# Patient Record
Sex: Female | Born: 1935 | Race: White | Hispanic: No | Marital: Married | Smoking: Never smoker
Health system: Southern US, Community
[De-identification: ages and names within clinical notes are randomized; demographics above are authoritative.]

## PROBLEM LIST (undated history)

## (undated) DIAGNOSIS — R001 Bradycardia, unspecified: Secondary | ICD-10-CM

## (undated) DIAGNOSIS — I480 Paroxysmal atrial fibrillation: Secondary | ICD-10-CM

## (undated) DIAGNOSIS — I5189 Other ill-defined heart diseases: Secondary | ICD-10-CM

## (undated) DIAGNOSIS — I700 Atherosclerosis of aorta: Secondary | ICD-10-CM

## (undated) DIAGNOSIS — F419 Anxiety disorder, unspecified: Secondary | ICD-10-CM

## (undated) DIAGNOSIS — I639 Cerebral infarction, unspecified: Secondary | ICD-10-CM

## (undated) DIAGNOSIS — I509 Heart failure, unspecified: Secondary | ICD-10-CM

## (undated) DIAGNOSIS — N893 Dysplasia of vagina, unspecified: Secondary | ICD-10-CM

## (undated) DIAGNOSIS — I495 Sick sinus syndrome: Secondary | ICD-10-CM

## (undated) DIAGNOSIS — Z950 Presence of cardiac pacemaker: Secondary | ICD-10-CM

## (undated) DIAGNOSIS — Z7901 Long term (current) use of anticoagulants: Secondary | ICD-10-CM

## (undated) DIAGNOSIS — I10 Essential (primary) hypertension: Secondary | ICD-10-CM

## (undated) DIAGNOSIS — M1990 Unspecified osteoarthritis, unspecified site: Secondary | ICD-10-CM

## (undated) DIAGNOSIS — C801 Malignant (primary) neoplasm, unspecified: Secondary | ICD-10-CM

## (undated) DIAGNOSIS — T8859XA Other complications of anesthesia, initial encounter: Secondary | ICD-10-CM

## (undated) HISTORY — DX: Bradycardia, unspecified: R00.1

## (undated) HISTORY — PX: JOINT REPLACEMENT: SHX530

## (undated) HISTORY — PX: HERNIA REPAIR: SHX51

## (undated) HISTORY — DX: Dysplasia of vagina, unspecified: N89.3

## (undated) HISTORY — DX: Cerebral infarction, unspecified: I63.9

## (undated) HISTORY — DX: Heart failure, unspecified: I50.9

## (undated) HISTORY — PX: APPENDECTOMY: SHX54

## (undated) HISTORY — PX: TOTAL KNEE ARTHROPLASTY: SHX125

## (undated) HISTORY — PX: COLONOSCOPY: SHX174

## (undated) HISTORY — PX: TONSILLECTOMY: SUR1361

## (undated) HISTORY — DX: Presence of cardiac pacemaker: Z95.0

## (undated) HISTORY — DX: Essential (primary) hypertension: I10

## (undated) HISTORY — PX: VAGINAL HYSTERECTOMY: SUR661

## (undated) HISTORY — DX: Sick sinus syndrome: I49.5

---

## 1997-01-18 HISTORY — PX: LEEP: SHX91

## 1999-09-14 ENCOUNTER — Other Ambulatory Visit: Admission: RE | Admit: 1999-09-14 | Discharge: 1999-09-14 | Payer: Self-pay | Admitting: Orthopedic Surgery

## 2001-05-12 ENCOUNTER — Encounter: Admission: RE | Admit: 2001-05-12 | Discharge: 2001-05-12 | Payer: Self-pay | Admitting: Internal Medicine

## 2001-05-12 ENCOUNTER — Encounter: Payer: Self-pay | Admitting: Internal Medicine

## 2001-05-12 ENCOUNTER — Encounter (INDEPENDENT_AMBULATORY_CARE_PROVIDER_SITE_OTHER): Payer: Self-pay | Admitting: *Deleted

## 2001-05-12 DIAGNOSIS — D242 Benign neoplasm of left breast: Secondary | ICD-10-CM

## 2001-05-12 HISTORY — DX: Benign neoplasm of left breast: D24.2

## 2001-05-12 HISTORY — PX: BREAST BIOPSY: SHX20

## 2002-05-22 ENCOUNTER — Encounter: Payer: Self-pay | Admitting: Internal Medicine

## 2002-05-22 ENCOUNTER — Encounter: Admission: RE | Admit: 2002-05-22 | Discharge: 2002-05-22 | Payer: Self-pay | Admitting: Internal Medicine

## 2003-09-25 ENCOUNTER — Encounter: Admission: RE | Admit: 2003-09-25 | Discharge: 2003-09-25 | Payer: Self-pay | Admitting: Internal Medicine

## 2003-11-20 ENCOUNTER — Ambulatory Visit: Payer: Self-pay | Admitting: Internal Medicine

## 2003-11-27 ENCOUNTER — Ambulatory Visit: Payer: Self-pay | Admitting: Internal Medicine

## 2003-12-05 ENCOUNTER — Ambulatory Visit: Payer: Self-pay | Admitting: Internal Medicine

## 2003-12-06 ENCOUNTER — Ambulatory Visit (HOSPITAL_COMMUNITY): Admission: RE | Admit: 2003-12-06 | Discharge: 2003-12-07 | Payer: Self-pay | Admitting: Internal Medicine

## 2003-12-06 DIAGNOSIS — Z950 Presence of cardiac pacemaker: Secondary | ICD-10-CM

## 2003-12-06 HISTORY — DX: Presence of cardiac pacemaker: Z95.0

## 2003-12-06 HISTORY — PX: INSERT / REPLACE / REMOVE PACEMAKER: SUR710

## 2003-12-16 ENCOUNTER — Ambulatory Visit: Payer: Self-pay | Admitting: Internal Medicine

## 2003-12-16 ENCOUNTER — Ambulatory Visit: Payer: Self-pay

## 2003-12-17 ENCOUNTER — Ambulatory Visit: Payer: Self-pay | Admitting: Internal Medicine

## 2003-12-26 ENCOUNTER — Ambulatory Visit: Payer: Self-pay | Admitting: Internal Medicine

## 2004-02-06 ENCOUNTER — Ambulatory Visit: Payer: Self-pay | Admitting: Internal Medicine

## 2004-03-20 ENCOUNTER — Ambulatory Visit: Payer: Self-pay | Admitting: Internal Medicine

## 2004-04-29 ENCOUNTER — Ambulatory Visit: Payer: Self-pay | Admitting: Gynecologic Oncology

## 2004-10-02 ENCOUNTER — Encounter: Admission: RE | Admit: 2004-10-02 | Discharge: 2004-10-02 | Payer: Self-pay | Admitting: Obstetrics and Gynecology

## 2005-01-25 ENCOUNTER — Ambulatory Visit: Payer: Self-pay | Admitting: Internal Medicine

## 2005-02-05 ENCOUNTER — Ambulatory Visit: Payer: Self-pay | Admitting: Internal Medicine

## 2005-04-02 ENCOUNTER — Encounter: Admission: RE | Admit: 2005-04-02 | Discharge: 2005-04-02 | Payer: Self-pay | Admitting: Internal Medicine

## 2005-04-07 ENCOUNTER — Ambulatory Visit: Payer: Self-pay | Admitting: Internal Medicine

## 2005-04-28 ENCOUNTER — Ambulatory Visit: Payer: Self-pay | Admitting: Gynecologic Oncology

## 2005-07-13 ENCOUNTER — Ambulatory Visit: Payer: Self-pay | Admitting: Internal Medicine

## 2005-09-13 ENCOUNTER — Ambulatory Visit: Payer: Self-pay | Admitting: Internal Medicine

## 2006-04-13 ENCOUNTER — Ambulatory Visit: Payer: Self-pay | Admitting: Internal Medicine

## 2006-05-18 ENCOUNTER — Encounter: Admission: RE | Admit: 2006-05-18 | Discharge: 2006-05-18 | Payer: Self-pay | Admitting: Internal Medicine

## 2006-06-29 ENCOUNTER — Ambulatory Visit: Payer: Self-pay | Admitting: Internal Medicine

## 2006-07-19 ENCOUNTER — Encounter: Payer: Self-pay | Admitting: Urology

## 2006-08-19 ENCOUNTER — Encounter: Payer: Self-pay | Admitting: Urology

## 2006-09-28 ENCOUNTER — Ambulatory Visit: Payer: Self-pay | Admitting: Internal Medicine

## 2006-12-28 ENCOUNTER — Ambulatory Visit: Payer: Self-pay | Admitting: Internal Medicine

## 2007-04-03 ENCOUNTER — Ambulatory Visit: Payer: Self-pay | Admitting: Internal Medicine

## 2007-06-07 ENCOUNTER — Encounter: Admission: RE | Admit: 2007-06-07 | Discharge: 2007-06-07 | Payer: Self-pay | Admitting: Internal Medicine

## 2007-06-19 ENCOUNTER — Ambulatory Visit: Payer: Self-pay | Admitting: Internal Medicine

## 2007-09-20 ENCOUNTER — Ambulatory Visit: Payer: Self-pay | Admitting: Internal Medicine

## 2007-12-21 ENCOUNTER — Ambulatory Visit: Payer: Self-pay | Admitting: Internal Medicine

## 2008-03-20 ENCOUNTER — Ambulatory Visit: Payer: Self-pay | Admitting: Internal Medicine

## 2008-04-26 ENCOUNTER — Encounter (INDEPENDENT_AMBULATORY_CARE_PROVIDER_SITE_OTHER): Payer: Self-pay | Admitting: Radiology

## 2008-06-13 DIAGNOSIS — Z950 Presence of cardiac pacemaker: Secondary | ICD-10-CM | POA: Insufficient documentation

## 2008-06-13 DIAGNOSIS — I4891 Unspecified atrial fibrillation: Secondary | ICD-10-CM | POA: Insufficient documentation

## 2008-06-13 DIAGNOSIS — I498 Other specified cardiac arrhythmias: Secondary | ICD-10-CM | POA: Insufficient documentation

## 2008-06-24 ENCOUNTER — Ambulatory Visit: Payer: Self-pay | Admitting: Internal Medicine

## 2008-07-25 ENCOUNTER — Encounter: Admission: RE | Admit: 2008-07-25 | Discharge: 2008-07-25 | Payer: Self-pay | Admitting: Internal Medicine

## 2008-09-17 ENCOUNTER — Telehealth (INDEPENDENT_AMBULATORY_CARE_PROVIDER_SITE_OTHER): Payer: Self-pay | Admitting: *Deleted

## 2008-10-07 ENCOUNTER — Ambulatory Visit: Payer: Self-pay | Admitting: Internal Medicine

## 2008-12-30 ENCOUNTER — Ambulatory Visit: Payer: Self-pay | Admitting: Internal Medicine

## 2009-04-21 ENCOUNTER — Ambulatory Visit: Payer: Self-pay | Admitting: Internal Medicine

## 2009-05-01 ENCOUNTER — Ambulatory Visit: Payer: Self-pay

## 2009-05-01 ENCOUNTER — Encounter: Payer: Self-pay | Admitting: Internal Medicine

## 2009-06-06 ENCOUNTER — Encounter: Payer: Self-pay | Admitting: Internal Medicine

## 2009-06-18 ENCOUNTER — Encounter: Payer: Self-pay | Admitting: Internal Medicine

## 2009-07-15 ENCOUNTER — Encounter: Payer: Self-pay | Admitting: Orthopaedic Surgery

## 2009-07-18 ENCOUNTER — Encounter: Payer: Self-pay | Admitting: Orthopaedic Surgery

## 2009-07-18 ENCOUNTER — Encounter: Payer: Self-pay | Admitting: Internal Medicine

## 2009-07-28 ENCOUNTER — Telehealth (INDEPENDENT_AMBULATORY_CARE_PROVIDER_SITE_OTHER): Payer: Self-pay | Admitting: *Deleted

## 2009-08-14 ENCOUNTER — Encounter: Admission: RE | Admit: 2009-08-14 | Discharge: 2009-08-14 | Payer: Self-pay | Admitting: Internal Medicine

## 2009-08-18 ENCOUNTER — Encounter: Payer: Self-pay | Admitting: Orthopaedic Surgery

## 2009-08-18 ENCOUNTER — Encounter: Payer: Self-pay | Admitting: Internal Medicine

## 2009-11-14 ENCOUNTER — Encounter (INDEPENDENT_AMBULATORY_CARE_PROVIDER_SITE_OTHER): Payer: Self-pay | Admitting: *Deleted

## 2009-12-08 ENCOUNTER — Ambulatory Visit: Payer: Self-pay | Admitting: Internal Medicine

## 2010-02-17 NOTE — Letter (Signed)
Summary: [facility]   [facility]   Imported By: Roderic Ovens 06/27/2009 12:05:52  _____________________________________________________________________  External Attachment:    Type:   Image     Comment:   External Document
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
## 2010-02-17 NOTE — Assessment & Plan Note (Signed)
Summary: 6 month Pacer check      Allergies Added: NKDA  Visit Type:  Follow-up Primary Provider:  Einar Crow   History of Present Illness: Alejandra Wiley is seen in followup palpitations associated with atrial arrhythmias as well as bradycardia requiring backup bradycardia pacing.   She denies chest pain apart from the discomfort that ,yesterday, she felt extremely lightheaded and presyncopal. This was relieved by sitting.  Her blood pressure today was noted as 100 to her baseline blood pressures normally run 120s.   echo cardiogram done a number of months ago demonstrated normal left ventricular function  Current Medications (verified): 1)  Flecainide Acetate 50 Mg Tabs (Flecainide Acetate) .... Take One and One Half Tablet Two Times A Day 2)  Warfarin Sodium 5 Mg Tabs (Warfarin Sodium) .... One By Mouth Monday, Wednesday, and Friday 3)  Warfarin Sodium 4 Mg Tabs (Warfarin Sodium) .... One By Mouth Tues, Thursday, Saturday and Sunday 4)  Nadolol 20 Mg Tabs (Nadolol) .... One By Mouth Daily 5)  Calcium Antacid Extra Strength 750 Mg Chew (Calcium Carbonate Antacid) .... One By Mouth Daily 6)  Century Senior  Tabs (Multiple Vitamins-Minerals) .... One By Mouth Daily 7)  Alendronate Sodium 70 Mg Tabs (Alendronate Sodium) .... 1 Tab Weekly 8)  Antacid 500 Mg Chew (Calcium Carbonate Antacid) .... As Needed 9)  Vitamin C Cr 500 Mg Cr-Tabs (Ascorbic Acid) .... 1 Tab Daily 10)  Vitamin D 1000 Unit Tabs (Cholecalciferol) .... 1 Tab Daily 11)  Folic Acid 400 Mcg Tabs (Folic Acid) .... 1 Tab Daily 12)  Fish Oil 1000 Mg Caps (Omega-3 Fatty Acids) .... 1 Tab Daily 13)  Multivitamins  Tabs (Multiple Vitamin) .... 1 Tab Daily 14)  B Complex-B12  Tabs (B Complex Vitamins) .... 1 Tab Daily 15)  Glucosamine 2000 Mg .... 1 Tab Daily  Allergies (verified): No Known Drug Allergies  Past History:  Past Medical History: Last updated: 06/13/2008 ATRIAL FIBRILLATION (ICD-427.31) SICK SINUS/  TACHY-BRADY SYNDROME (ICD-427.81) PACEMAKER (ICD-V45..01) BRADYCARDIA (ICD-427.89)    Past Surgical History: Last updated: 06/13/2008 hysterectomy - 1962 LEEP - 1999 PCM - Medtronic Impulse DTDR01 - 12/06/2003  Social History: Last updated: 06/13/2008 Part Time  Married  Tobacco Use - No.  Alcohol Use - yes Regular Exercise - yes Drug Use - no  Risk Factors: Exercise: yes (06/13/2008)  Risk Factors: Smoking Status: never (06/13/2008)  Vital Signs:  Patient profile:   77 year old female Height:      67 inches Weight:      127 pounds BMI:     19 .96 Pulse rate:   71 / minute BP sitting:   102 / 65  (left arm) Cuff size:   regular  Vitals Entered By: Bishop Dublin, CMA (December 08, 2009 11:37 AM)  Physical Exam  General:  The patient was alert and oriented in no acute distress. HEENT Normal.  Neck veins were flat, carotids were brisk.  Lungs were clear.  Heart sounds were regular without murmurs or gallops.  Abdomen was soft with active bowel sounds. There is no clubbing cyanosis or edema. Skin Warm and dry    PPM Specifications Following MD:  Sherryl Manges, MD     PPM Vendor:  Medtronic     PPM Model Number:  [id number]     PPM Serial Number:  [id number] PPM DOI:  12/06/2003     PPM Implanting MD:  Sherryl Manges, MD  Lead 1    Location: RA     DOI: 12/06/2003  Model #: [id number]     Serial #: [id number]     Status: active Lead 2    Location: RV     DOI: 12/06/2003     Model #: [id number]     Serial #: [id number]     Status: active  Magnet Response Rate:  BOL85 ERI  65  Indications:  TACHY-BRADY SYNDROME   PPM Follow Up Remote Check?  No Battery Voltage:  2.73 V     Battery Est. Longevity:  2 years     Pacer Dependent:  No       PPM Device Measurements Atrium  Amplitude: 2.0 mV, Impedance: 605 ohms, Threshold: 0.875 V at 0.4 msec Right Ventricle  Amplitude: 8.0 mV, Impedance: 689 ohms, Threshold: 0.875 V at 0.4 msec  Episodes MS Episodes:  329     Percent Mode  Switch:  0.9%     Coumadin:  Yes Ventricular High Rate:  3     Atrial Pacing:  71.4%     Ventricular Pacing:  2.6%  Parameters Mode:  DDDR     Lower Rate Limit:  60     Upper Rate Limit:  130 Paced AV Delay:  300     Sensed AV Delay:  230 Rate Response Parameters:  ADL 2 Next Cardiology Appt Due:  05/19/2010 Tech Comments:  No parameter changes.  Device function normal. ROV 6 months [facility]  cl[facility]. Altha Harm, LPN  December 08, 2009 11:49 AM   Impression & Recommendations:  Problem # 1:  ATRIAL FIBRILLATION (ICD-427.31) She continues to have episodes of atrial fibrillation which required for the summer more frequent in the fall. They are quite discombobulated. I recommended that she take an extra flecainide i.e. 50 mg if she has episodes that last longer than 3 hours; most of her episodes are much shorter than that.  I am not sure how the episode of atrial fibrillation was associated with the presyncope the following day which I suspect was related to low blood pressure. This is suggested by the fact that her blood pressure is lower today and that the symptoms were relieved by sitting.  We will try and use a blood pressure machine and a calendar to see if we can a)correlate her dizziness with her blood pressure and b)and is there a correlation between her atrial fibrillation and her blood pressures temporally. Her updated medication list for this problem includes:    Flecainide Acetate 50 Mg Tabs (Flecainide acetate) .Marland Kitchen... Take one and one half tablet two times a day    Warfarin Sodium 5 Mg Tabs (Warfarin sodium) ..... One by mouth monday, wednesday, and friday    Warfarin Sodium 4 Mg Tabs (Warfarin sodium) ..... One by mouth tues, thursday, saturday and sunday    Nadolol 20 Mg Tabs (Nadolol) ..... One by mouth daily  Problem # 2:  PRESYNCOPE (ICD-780.4) as above  Problem # 3:  PACEMAKER, PERMANENT (ICD-V45.01) Device parameters and data were reviewed and no changes were made  Problem  # 4:  SICK SINUS/ TACHY-BRADY SYNDROME (ICD-427.81) stable post pacer Her updated medication list for this problem includes:    Flecainide Acetate 50 Mg Tabs (Flecainide acetate) .Marland Kitchen... Take one and one half tablet two times a day    Warfarin Sodium 5 Mg Tabs (Warfarin sodium) ..... One by mouth monday, wednesday, and friday    Warfarin Sodium 4 Mg Tabs (Warfarin sodium) ..... One by mouth tues, thursday, saturday and sunday    Nadolol 20 Mg Tabs (Nadolol) .Marland KitchenMarland KitchenMarland KitchenMarland Kitchen  One by mouth daily  Patient Instructions: 1)  Your physician recommends that you schedule a follow-up appointment in: 4 months 2)  Your physician has requested that you purchase a BP monitor for your home and regularly monitor and record your blood pressure readings at home.   Try to coorelate symptoms with BP readings.  Please record symptoms with BP readings at that time.

## 2010-02-17 NOTE — Medication Information (Signed)
Summary: Med List  Med List   Imported By: Harlon Flor 04/23/2009 10:23:18  _____________________________________________________________________  External Attachment:    Type:   Image     Comment:   External Document

## 2010-02-17 NOTE — Cardiovascular Report (Signed)
Summary: Office Visit  Office Visit   Imported By: Marylou Mccoy 12/17/2009 11:52:15  _____________________________________________________________________  External Attachment:    Type:   Image     Comment:   External Document

## 2010-02-17 NOTE — Cardiovascular Report (Signed)
Summary: Office Visit   Office Visit   Imported By: Roderic Ovens 01/23/2009 13:56:40  _____________________________________________________________________  External Attachment:    Type:   Image     Comment:   External Document

## 2010-02-17 NOTE — Miscellaneous (Signed)
Summary: dx correction  Clinical Lists Changes  Problems: Changed problem from PACEMAKER (ICD-V45..01) to PACEMAKER, PERMANENT (ICD-V45.01)  changed the incorrect dx code to correct dx code 

## 2010-02-17 NOTE — Progress Notes (Signed)
Summary: PM by phone  Phone Note Call from Patient   Summary of Call: Pt states she was cleaning out drawer and found device to check PM by phone.  She cannot remember if she is due to do that or not.  Please call patient with that information.  [phone] Initial call taken by: Park Breed,  July 28, 2009 10:49 AM  Follow-up for Phone Call        Spoke with patient, she will bring Carelink transmitter for instruction ,  with her in October when she sees Dr. Graciela Husbands. Follow-up by: Altha Harm, LPN,  July 29, 2009 8:38 AM

## 2010-03-26 ENCOUNTER — Encounter (INDEPENDENT_AMBULATORY_CARE_PROVIDER_SITE_OTHER): Payer: Self-pay | Admitting: *Deleted

## 2010-03-31 NOTE — Letter (Signed)
Summary: Device-Delinquent Check  [facility], Main Office  [address]   [place], [place] [address]   Phone: [phone]  Fax: [phone]     March 26, 2010 MRN: [id number]   Alejandra Wiley [address] [place], [place]  [address]   Dear [name],  According to our records, you have not had your implanted device checked in the recommended period of time.  We are unable to determine appropriate device function without checking your device on a regular basis.  Please call our office to schedule an appointment as soon as possible.  If you are having your device checked by another physician, please call us so that we may update our records.  Thank you,  Letta Moynahan, EMT  March 26, 2010 2:14 PM  [facility]

## 2010-05-12 ENCOUNTER — Ambulatory Visit (INDEPENDENT_AMBULATORY_CARE_PROVIDER_SITE_OTHER): Payer: Medicare Other | Admitting: Internal Medicine

## 2010-05-12 ENCOUNTER — Encounter: Payer: Self-pay | Admitting: Internal Medicine

## 2010-05-12 DIAGNOSIS — Z950 Presence of cardiac pacemaker: Secondary | ICD-10-CM

## 2010-05-12 DIAGNOSIS — I498 Other specified cardiac arrhythmias: Secondary | ICD-10-CM

## 2010-05-12 DIAGNOSIS — I4891 Unspecified atrial fibrillation: Secondary | ICD-10-CM

## 2010-05-12 NOTE — Assessment & Plan Note (Signed)
The patient's device was interrogated.  The information was reviewed. No changes were made in the programming.    

## 2010-05-12 NOTE — Patient Instructions (Signed)
Your physician recommends that you schedule a follow-up appointment in: 6 MONTHS 

## 2010-05-12 NOTE — Assessment & Plan Note (Signed)
And recent atrial fibrillation. She was corrected her discernment of her episode in February. She is taking warfarin.

## 2010-05-12 NOTE — Assessment & Plan Note (Signed)
Stable post pacemaker implantation 

## 2010-05-12 NOTE — Progress Notes (Signed)
HPI  Alejandra Wiley is a 77 y.o. female  seen in followup palpitations associated with atrial arrhythmias as well as bradycardia requiring backup bradycardia pacing.   She has had some problems with intermittent palpitations. Most notably these weren't really February. Her heart has been still since then. She thinks that stress and heat may be provocative for this.  Her blood pressures have been more stable at home.  They are in the process of moving to twin [place]. She has found a solution for her grand piano   echo cardiogram done a number of months ago demonstrated normal left ventricular function Past Medical History  Diagnosis Date  . Arrhythmia     A-Fib  . Sinoatrial node dysfunction   . Pacemaker 12/06/2003    Medtronic Impulse DTDR01  . Bradycardia     Past Surgical History  Procedure Date  . Vaginal hysterectomy   . Leep 1999  . Insert / replace / remove pacemaker 12/06/2003    Medtronic impulse DTDR01    Current Outpatient Prescriptions  Medication Sig Dispense Refill  . alendronate (FOSAMAX) 70 MG tablet Take 70 mg by mouth every 7 (seven) days. Take with a full glass of water on an empty stomach.       . Alum & Mag Hydroxide-Simeth (ANTACID M PO) Take 1 tablet by mouth daily.        . Ascorbic Acid (VITAMIN C) 500 MG tablet Take 500 mg by mouth daily.        . B Complex Vitamins (B COMPLEX-B12) TABS Take by mouth 1 dose over 46 hours.        . calcium carbonate (TUMS - DOSED IN MG ELEMENTAL CALCIUM) 500 MG chewable tablet Chew 1 tablet by mouth daily.        . calcium carbonate (TUMS EX) 750 MG chewable tablet Chew 1 tablet by mouth daily.        . cholecalciferol (VITAMIN D) 1000 UNITS tablet Take 1,000 Units by mouth daily.        . flecainide (TAMBOCOR) 50 MG tablet 50 mg. Take one and one half tablet two times a day       . folic acid (FOLVITE) 400 MCG tablet Take 400 mcg by mouth daily.        . Glucosamine-Chondroitin-Vit C 2000-1200-60 MG/30ML LIQD Take by  mouth 1 dose over 46 hours.        . Multiple Vitamin (MULTIVITAMIN) tablet Take 1 tablet by mouth daily.        . Multiple Vitamins-Minerals (CENTURY SENIOR FORMULA PO) Take by mouth 1 dose over 46 hours.        . nadolol (CORGARD) 20 MG tablet Take 20 mg by mouth daily.        . Omega-3 Fatty Acids (FISH OIL) 1000 MG CAPS Take by mouth.        . warfarin (COUMADIN) 4 MG tablet 4 mg. Take as directed        . warfarin (COUMADIN) 5 MG tablet 5 mg. Take as directed          No Known Allergies  Review of Systems negative except from HPI and PMH  Physical Exam Well developed and well nourished in no acute distress HENT normal E scleral and icterus clear Neck Supple JVP flat; carotids brisk and full Clear to ausculation Regular rate and rhythm, no murmurs gallops or rub Soft with active bowel sounds No clubbing cyanosis and edema Alert and oriented, grossly normal motor and  sensory function Skin Warm and Dry  Assessment and  Plan

## 2010-06-02 NOTE — Letter (Signed)
June 24, 2008    Dr. Einar Crow  [address]  [place], [place] [address]   RE:  AIZLYN, SCHIFANO  MRN:  [id number]  /  DOB:  [birth year]   Dear Alejandra Wiley:   I hope this letter finds you well.  I am still trying to get that lunch  together with you.   In any case, [name] came in today in followup for her palpitations,  which have been associated with atrial fibrillation and for which she  has been taking flecainide at a dose of 50 mg twice daily.  She  describes a number of episodes where she has gotten quite symptomatic.  On one occasion, she actually passed out while she was in [place].  These episodes are associated with significant residual fatigue.  She  gives a number of times and dates, some are which we can correlate,  others which we cannot.  I, in fact, gave her a calendar of the events  as detected by her device and she is going to try and correlate with a  calendar she keeps at home.   In the event that there is a significant one, we will plan to increase  her flecainide from 50-75 mg twice daily.  I had offered her 100 and she  would prefer to do an intermediate dose and I think that is reasonable.   We will use a 3-month window to see if we can have any impact as her  current frequency of episodes is about 8-10 per month.   I should note that her other medications include nadolol 20 and  warfarin.   PHYSICAL EXAMINATION:  VITAL SIGNS:  Her blood pressure is 144/89 with a  pulse of 87.  Her weight was 130, which is down 6 pounds.  GENERAL:  She notes parenthetically no fever, weight loss, or sweats.  NECK:  Veins were flat.  LUNGS:  Clear.  HEART:  Sounds were regular.  EXTREMITIES:  Without edema.   Interrogation of her pacemaker demonstrated the aforementioned episodes  of atrial fibrillation.  The other issue was that she has a little bit  of dimorphic heart rate excursion suggesting that her rate response  algorithm is too aggressively  programmed, and we will reprogram this  when I see her again in 8 weeks' time.   Alejandra Wiley, thanks very much for allowing [place] to participate in her care.    Sincerely,      Duke Salvia, MD, [facility]  Electronically Signed    SCK/MedQ  DD: 06/24/2008  DT: 06/25/2008  Job #: [id number]

## 2010-06-02 NOTE — Letter (Signed)
June 29, 2006    Einar Crow, MD  [address]  [place], [place] [address]   RE:  [name], ANTONOPOULOS  MRN:  [id number]  /  DOB:  12-Sep-1932   Dear [name],   [name] comes in today. She has paroxysmal atrial fibrillation and  bradycardia. She did have a couple of episodes where she has felt really  worn out and tired (see below). Otherwise she is doing quite well.   PHYSICAL EXAMINATION:  VITAL SIGNS:  Her blood pressure is 134/85 with a  pulse of 73.  LUNGS:  Clear.  HEART:  Sounds were regular.  EXTREMITIES:  Without edema.   Interrogation of her Medtronic pulse generator demonstrates a P wave  of  1 with impedance of 678, threshold of 0.5 at 0.4. The R wave was 8 with  impedance of 650, threshold of 1 volt at 0.4. There were multiple  episodes atrial fibrillation which correlate at least roughly with some  of her symptoms.   IMPRESSION:  1. Paroxysmal atrial fibrillation on flecainide.  2. Bradycardia status post pacemaker implantation.   [name] overall is doing pretty well. Will plan to see her again in 1  year's time. If there is anything else we can do in the interim, please  do not hesitate to contact me.    Sincerely,      Duke Salvia, MD, [facility]  Electronically Signed    SCK/MedQ  DD: 06/29/2006  DT: 06/29/2006  Job #: [id number]

## 2010-06-02 NOTE — Letter (Signed)
June 19, 2007    Einar Crow, MD  [address]  [place], [place] [address]   RE:  Alejandra, Wiley  MRN:  [id number]  /  DOB:  Mar 08, 1932   Dear Alejandra Wiley:   Alejandra Wiley came in today with her husband in follow-up for her  paroxysmal atrial fibrillation and bradycardia.  She has had no tachy  palpitations of which she is aware.  She does have days when she feels  considerably worse than others.  She has had a number of syncopal  episodes over the last couple of years including one intercurrently.  This most recent episode occurred one morning.  She awakened and felt  that this was a day that was going to be a bad day.  She was up and  about in her bedroom, up and about in her kitchen, went to pour coffee  and then she fainted to the floor.  Her husband helped her get to her  bed after about five minutes, and when she became vertical again, she  has significant worsening of her symptoms which were then relieved by  rest.   Interestingly, Alejandra Wiley, she has an intermediate duration history of  orthostatic intolerance, heat intolerance, shower intolerance so that  she takes them cold tepid.  Surprisingly, she does not have much  orthostatic lightheadedness.   Her current medications include:  1. Nadolol 20.  2. Flecainide 50 b.i.d.  3. Fish oil.  4. Coumadin.   On examination, today her blood pressure was 135/82.  Her pulse was 136.  Her pulse was 64.  Her lungs were clear.  Her neck veins were flat.  Her heart sounds were regular without murmurs or gallops.  The abdomen was soft.  The extremities were without edema.  The skin was warm and dry.   Interrogation of her Medtronic pulse generator demonstrates a P-wave of  1 with a threshold of 1 volt at 0.4, impedance of 634; the R-wave was 8  with impedance of 707, threshold 0.75 at 0.4.  Battery voltage is 2.78.  She is ventricularly paced 5% of the time, atrial paced 76% of the time.  She has multiple  episodes of atrial fibrillation constituting 3% of her  total time.  Her heart rate is moderately rapid in atrial fibrillation  with the mean heart rate probably in the high 90s.  Interestingly, on  further review, there is a bimodal distribution of her sensor-driver  heart rate suggesting that her sensor may be a little bit over zealously  reprogrammed.  This will need to be adjusted.   IMPRESSION:  1. Paroxysmal atrial fibrillation on flecainide.  2. Bradycardia status post pacemaker implantation.  3. Orthostatic intolerance, question mechanism.  4. Chronotropic incompetence with bimodal heart rate distribution.   Alejandra Wiley, [name] major complaints today are dizziness and  lightheadedness.  We went through a number of techniques which may help  her modify her symptoms.  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   She was advised to let us know if there is anything that happens in the  interval.  Otherwise, we will plan to see her again one year's time.   Thanks very much for allowing [place] to participate in her care.    Sincerely,      Duke Salvia, MD, [facility]  Electronically Signed    SCK/MedQ  DD: 06/19/2007  DT: 06/19/2007  Job #: [id number]

## 2010-06-05 NOTE — Discharge Summary (Signed)
NAMEOLAR, SANTINI NO.:  [id number]   MEDICAL RECORD NO.:  [id number]          PATIENT TYPE:  OIB   LOCATION:  3704                         FACILITY:  [facility]   PHYSICIAN:  Duke Salvia, M.D.  DATE OF BIRTH:  09-Apr-1932   DATE OF ADMISSION:  12/06/2003  DATE OF DISCHARGE:  12/07/2003                                 DISCHARGE SUMMARY   DISCHARGE DIAGNOSES:  1.  History of symptomatic paroxysmal atrial fibrillation with episodes      increasing in frequency causing weakness, fatigue and headaches.  2.  History of presyncope and syncope.  3.  Bradycardia on low dose beta-blockers.  4.  Prior assessment by Dr. Kathe Becton for atrial fibrillation ablation.  5.  Question of left apical pneumothorax with serial chest x-rays prior to      discharge including the morning of discharge.   SECONDARY DIAGNOSES:  1.  History of vaginal cancer status post LEEP.  2.  Incomplete right bundle branch block.  3.  Mitral valve prolapse with mild to moderate mitral regurgitation.  4.  Dyslipidemia.  5.  Degenerative joint disease.  6.  Osteoporosis.  7.  Status post hysterectomy/knee surgery.   PROCEDURE:  1.  Status post implantation of permanent pacemaker.  This is a Medtronic      Capsurefix [id number] by Dr. Sherryl Manges.  2.  Mild left apical pneumothorax with right ventricular lead dislodgement      and repositioning.  3.  Followup chest x-ray in the morning of postprocedure day #1.   DISCHARGE DISPOSITION:  The patient has been given a mobility sheet which  discusses the mobility of the left upper extremity in the next week.  The  patient has been asked not to shower for the next seven days until Friday  December 13, 2003.  She may sponge bathe until then.  She is to avoid  getting the incision wet.  She is asked not to drive for the next 10 days.   DISCHARGE MEDICATIONS:  1.  Toprol XL 50 mg daily, this is a new dose.  2.  Fosamax 70 mg weekly.  3.  Coumadin 4 mg  daily.  4.  Glucosamine daily.  5.  Multivitamin daily.  6.  Vitamin C daily.  7.  Calcium 750 mg daily.  8.  Vitamin E 400 International Units daily.  9.  Vitamin B12 500 mcg daily.  10. For pain control Tylenol 325 mg 1-2 tabs every 4-6 hours as needed for      pain.   ACTIVITY:  Has been discussed with the patient.   DISCHARGE DIET:  Low sodium, low cholesterol diet.   The patient is to keep her incision and dry for the next week, but she may  start tub baths or showers Friday December 13, 2003.  She has followup  appointments at [facility], [facility] [address], the [facility] Monday December 16, 2003 at 11:00 a.m. and  she will see Dr. Graciela Husbands February 2006, his office will call with that  appointment.   BRIEF HISTORY:  The patient is a 77 year old female, she has a history of  atrial fibrillation, it has been increasing in frequency and is quite  symptomatic.  She exhibits significant weakness and fatigue as well as  headaches.  She has had some presyncope and in deed some syncope.  Her  medications included Toprol XL, but dosage of this has been limited by  significant bradycardia.  She has heart rates in the 30s with sinus rhythm,  uncontrolled atrial fibrillation heart rates are greater than 100.  The  patient has paroxysmal atrial fibrillation that is quite symptomatic and  tachybrady syndrome.  Controlling atrial fibrillation would be made easier  if she were not so sensitive to AV nodal blocking agents.  Placement of a  Demand Pacemaker would make the role of rate control drugs a lot easier.  The patient has also been exposed to the possibility of antiarrhythmic drugs  as well as the aforementioned atrial fibrillation ablation technique  practice by Dr. Phillips Climes.  In summary the patient and her husband have chosen  for pacemaker placement and this will be done on an elective basis on Friday  December 06, 2003.   HOSPITAL COURSE:   The patient presented electively Friday December 06, 2003,  pacemaker was implanted by Dr. Graciela Husbands.  There was some air aspirated during  the procedure and there was right ventricular lead dislodgement.  This was  repositioned successfully and the patient taken to the recovery area.  She  will have serial chest x-rays to rule out left apical pneumothorax and this  will be resolved prior to her discharge.  The patient most probably goes  home December 07, 2003 with an addendum discharge to cover the period of her  discharge.       GM/MEDQ  D:  12/06/2003  T:  12/07/2003  Job:  [id number]   cc:   Einar Crow, MD  [facility]  [place]

## 2010-06-05 NOTE — Op Note (Signed)
NAMEJOYDAN, GRETZINGER Alejandra Wiley.:  [id number]   MEDICAL RECORD Alejandra Wiley.:  [id number]          PATIENT TYPE:  OIB   LOCATION:  2899                         FACILITY:  [facility]   PHYSICIAN:  Duke Salvia, M.D.  DATE OF BIRTH:  1932/06/05   DATE OF PROCEDURE:  12/06/2003  DATE OF DISCHARGE:                                 OPERATIVE REPORT   PREOPERATIVE DIAGNOSIS:  Tachy-brady syndrome.   POSTOPERATIVE DIAGNOSIS:  Tachy-brady syndrome.   PROCEDURE:  Implantation of a dual-chamber pacemaker.   Following obtaining informed consent, the patient was brought to the  electrophysiology laboratory and placed on the fluoroscopic table in the  supine position.  After routine prep and drape, lidocaine was infiltrated in  the prepectoral subclavicular region.  An incision was made and carried down  to the layer of the prepectoral fascia using electrocautery and sharp  dissection.  A pocket was formed similarly.  Hemostasis was obtained.   Thereafter, our attention was turned to getting access to the extrathoracic  left subclavian vein which was accomplished with some difficulty.  There was  aspiration of air on the first attempt as I inadvertently ended up below the  first rib from a true lateral starting point.  The patient tolerated this  well.  Venipunctures were then accomplished x 2, with a 0 silk suture placed  in a figure-of-eight fashion and allowed to hang loosely.  Subsequently, a 7  [place] __________  introducer sheath was placed, which allowed for the  passage of a Medtronic 5076, 58 cm active fixation ventricular lead, serial  [id number], and a St. Jude 1652 passive fixation atrial lead, [place]  [id number].  Under fluoroscopic guidance, these were manipulated in the right  ventricular septum as identified in the LAO position and the right atrial  appendage, respectively, where the bipolar R wave was 9.1 millivolts, with a  pacing impedance of 700 ohms, a  threshold of 1.4  volts at 0.4 msec, current  threshold 2.3 MA.  The bipolar P wave is 2.1 millivolts, with a pacing  impedance of 470 ohms, a threshold of 1 volt at 0.4 msec, and current  threshold 2.3 MA.  With these acceptable parameters recorded, the leads were  secured to the prepectoral fascia and attached to a Medtronic Impulse DTDR01  pulse generator, serial number [id number].  Through the device, AV pacing  was identified.  The pocket was copiously irrigated with antibiotic-  containing saline solution.  Hemostasis was ensured, and the leads and the  pulse generator were placed in the pocket and secured to the  prepectoral fascia.  The wound was washed, dried, and a Benzoin and Steri-  Strip dressing was applied.  Needle counts, sponge counts, and instrument  counts were correct at the end of the procedure, according to the staff.   The patient tolerated the procedure, notwithstanding the aforementioned  complication.       SCK/MEDQ  D:  12/06/2003  T:  12/06/2003  Job:  [id number]   cc:   Einar Crow, M.D.  [facility]  [facility]  [facility]

## 2010-07-07 ENCOUNTER — Encounter: Payer: Self-pay | Admitting: Cardiovascular Disease

## 2010-10-28 ENCOUNTER — Encounter: Payer: Self-pay | Admitting: Internal Medicine

## 2010-10-28 ENCOUNTER — Ambulatory Visit (INDEPENDENT_AMBULATORY_CARE_PROVIDER_SITE_OTHER): Payer: Medicare Other | Admitting: *Deleted

## 2010-10-28 DIAGNOSIS — I495 Sick sinus syndrome: Secondary | ICD-10-CM

## 2010-10-28 LAB — PACEMAKER DEVICE OBSERVATION
AL AMPLITUDE: 1 mv
AL IMPEDENCE PM: 774 Ohm
BAMS-0001: 175 {beats}/min
RV LEAD IMPEDENCE PM: 710 Ohm
RV LEAD THRESHOLD: 0.75 V

## 2010-10-28 NOTE — Progress Notes (Signed)
PPM check 

## 2010-12-07 ENCOUNTER — Telehealth: Payer: Self-pay

## 2010-12-07 MED ORDER — FLECAINIDE ACETATE 50 MG PO TABS: ORAL_TABLET | ORAL | Status: DC

## 2010-12-07 NOTE — Telephone Encounter (Signed)
Refill sent for flecainide 50 mg take one and one-half tablet twice a day. 

## 2010-12-14 ENCOUNTER — Telehealth: Payer: Self-pay

## 2010-12-14 MED ORDER — FLECAINIDE ACETATE 50 MG PO TABS: ORAL_TABLET | ORAL | Status: DC

## 2010-12-14 NOTE — Telephone Encounter (Signed)
Refill sent for flecainide 50 mg take one and one-half tablet twice a day.

## 2010-12-15 ENCOUNTER — Telehealth: Payer: Self-pay

## 2010-12-15 NOTE — Telephone Encounter (Signed)
Refill for flecainide called to [facility] pharmacy; difficulties with computers on Monday, 12/14/2010.

## 2011-06-15 ENCOUNTER — Encounter: Payer: Self-pay | Admitting: *Deleted

## 2011-06-22 ENCOUNTER — Encounter: Payer: Self-pay | Admitting: Internal Medicine

## 2011-06-22 ENCOUNTER — Ambulatory Visit (INDEPENDENT_AMBULATORY_CARE_PROVIDER_SITE_OTHER): Payer: Medicare Other | Admitting: Internal Medicine

## 2011-06-22 VITALS — BP 110/60 | HR 66 | Ht 68.0 in | Wt 132.5 lb

## 2011-06-22 DIAGNOSIS — I498 Other specified cardiac arrhythmias: Secondary | ICD-10-CM

## 2011-06-22 DIAGNOSIS — Z950 Presence of cardiac pacemaker: Secondary | ICD-10-CM

## 2011-06-22 DIAGNOSIS — I4891 Unspecified atrial fibrillation: Secondary | ICD-10-CM

## 2011-06-22 LAB — PACEMAKER DEVICE OBSERVATION
AL IMPEDENCE PM: 581 Ohm
AL THRESHOLD: 1 V
BAMS-0001: 175 {beats}/min
BATTERY VOLTAGE: 2.7 V
RV LEAD IMPEDENCE PM: 627 Ohm
RV LEAD THRESHOLD: 0.875 V

## 2011-06-22 NOTE — Assessment & Plan Note (Signed)
100% atrial pacing 

## 2011-06-22 NOTE — Patient Instructions (Signed)
Your physician wants you to follow-up in: 1 year with Dr. Graciela Husbands, 6 months for a device check. You will receive a reminder letter in the mail two months in advance. If you don't receive a letter, please call our office to schedule the follow-up appointment.

## 2011-06-22 NOTE — Assessment & Plan Note (Signed)
The patient has a history of paroxysmal atrial fibrillation as she has had no detected atrial fibrillation over the last 8 months by her device. We spent more than 50% of our 30 minute visit today discussing alternative anticoagulation to warfarin, specifically the risks and benefits of Rivaroxaban and  apixoban as well as dabigitran.   She will look into relative costs and let us know what she wants todo

## 2011-06-22 NOTE — Assessment & Plan Note (Signed)
The patient's device was interrogated.  The information was reviewed. No changes were made in the programming.    

## 2011-06-22 NOTE — Progress Notes (Signed)
  HPI  Alejandra Wiley is a 78 y.o. female  seen in followup for palpitations associated with atrial arrhythmias as well as bradycardia requiring backup bradycardia pacing.   The patient denies chest pain, shortness of breath, nocturnal dyspnea, orthopnea or peripheral edema.  There have been no palpitations, lightheadedness or syncope.    Echocardiogram done a number of months ago demonstrated normal left ventricular function Past Medical History  Diagnosis Date  . Pacemaker 12/06/2003    Medtronic Impulse DTDR01  . Bradycardia   . Arrhythmia     A-Fib  . Sinoatrial node dysfunction   . Sick sinus syndrome     Past Surgical History  Procedure Date  . Vaginal hysterectomy   . Leep 1999  . Insert / replace / remove pacemaker 12/06/2003    Medtronic impulse DTDR01    Current Outpatient Prescriptions  Medication Sig Dispense Refill  . B Complex Vitamins (B COMPLEX-B12) TABS Take by mouth 1 dose over 46 hours.        . cholecalciferol (VITAMIN D) 1000 UNITS tablet Take 1,000 Units by mouth daily.        . flecainide (TAMBOCOR) 50 MG tablet Take one and one half tablet two times a day  270 tablet  3  . folic acid (FOLVITE) 400 MCG tablet Take 400 mcg by mouth daily.        . Glucosamine-Chondroitin-Vit C 2000-1200-60 MG/30ML LIQD Take by mouth 1 dose over 46 hours.        . Multiple Vitamins-Minerals (CENTURY SENIOR FORMULA PO) Take by mouth 1 dose over 46 hours.        . nadolol (CORGARD) 20 MG tablet Take 20 mg by mouth daily.        . Omega-3 Fatty Acids (FISH OIL) 1000 MG CAPS Take by mouth.        . warfarin (COUMADIN) 4 MG tablet 4 mg. Take as directed        . warfarin (COUMADIN) 5 MG tablet 5 mg. Take as directed          No Known Allergies  Review of Systems negative except from HPI and PMH BP 110/60  Pulse 66  Ht 5\' 8"  (1.727 m)  Wt 132 lb 8 oz (60.102 kg)  BMI 20.15 kg/m2  Physical Exam Well developed and well nourished in no acute distress HENT normal E  scleral and icterus clear Neck Supple JVP flat; carotids brisk and full Clear to ausculation Regular rate and rhythm, no murmurs gallops or rub Soft with active bowel sounds No clubbing cyanosis and edema Alert and oriented, grossly normal motor and sensory function Skin Warm and Dry  Assessment and  Plan

## 2011-06-30 ENCOUNTER — Telehealth: Payer: Self-pay | Admitting: Internal Medicine

## 2011-06-30 NOTE — Telephone Encounter (Signed)
New Problem:    Patient called in needing to know what the strength of her Tera Mater would be so she can make an informed decision about switching over from warfarin (COUMADIN) 5 MG tablet and warfarin (COUMADIN) 4 MG tablet.  Please call back.

## 2011-06-30 NOTE — Telephone Encounter (Signed)
Called and spoke with pt.  Dr. Graciela Husbands had given her the option to choose between the 3 new anticoagulation agents.  Pt has done research and would like to try Xarelto.  She needs to know what dose so she can check the price at the pharmacy.  Unfortunately we do not have any records of recent labwork.  She stated she had labs done approximately a month ago with Dr. Dareen Piano.  I have called Dr. Ewell Poe office to get a copy of the records.  For now, I have told her she can check the price for 20mg  once daily.  Once she does check the price, she will call us back and we can send a prescription in.   Pt did have INR checked today.  Those results are not available yet.  She is aware we will need those results as well to help direct the transition.

## 2011-07-12 ENCOUNTER — Telehealth: Payer: Self-pay | Admitting: Internal Medicine

## 2011-07-12 NOTE — Telephone Encounter (Signed)
Pt was told to check on the price of Xarelto and get back to Dr Graciela Husbands. Pt checked with insurance and is going to use mail order pharmacy. Please check with Dr Graciela Husbands to see what MG pt needs and mail script to pt please.

## 2011-07-13 ENCOUNTER — Other Ambulatory Visit: Payer: Self-pay

## 2011-07-13 NOTE — Telephone Encounter (Signed)
What dose xarelto do you want this pt to take?  I do not see a recent chemistry in chart. Thanks!

## 2011-07-14 NOTE — Telephone Encounter (Signed)
Pt made aware of what I am waiting on. Understanding verb.

## 2011-07-14 NOTE — Telephone Encounter (Signed)
Good job [employer]  :)  Can you pelase order so we can dose appropriately as she is now willing

## 2011-07-14 NOTE — Telephone Encounter (Signed)
rec'd BMP from PCP done 06/09/11 BUN=17 Creat=0.8 GFR=>60 BUN/CReat ratio=21.3  Ok to start 20 mg?

## 2011-07-14 NOTE — Telephone Encounter (Signed)
Pt called back, says she had labs drawn recently at PCP (Dr. Dareen Piano).  I will call them to get copy of most recent labs (hopefully a BMP) and call pt back.  Understanding verb.

## 2011-07-14 NOTE — Telephone Encounter (Signed)
LMTCB

## 2011-07-14 NOTE — Telephone Encounter (Signed)
Dr. Ewell Poe office is going to fax me labs. I will await these, review and call pt back.

## 2011-07-15 ENCOUNTER — Other Ambulatory Visit: Payer: Self-pay

## 2011-07-15 ENCOUNTER — Telehealth: Payer: Self-pay

## 2011-07-15 MED ORDER — RIVAROXABAN 20 MG PO TABS: 20.0000 mg | ORAL_TABLET | Freq: Every day | ORAL | Status: DC

## 2011-07-15 NOTE — Telephone Encounter (Signed)
Can you read below and help?  Thanks!

## 2011-07-15 NOTE — Telephone Encounter (Signed)
Pt called back.  I explained we can start Xarelto 20 mg.  She would like printed RX and samples left at [facility].  In the meantime, she asks how to wean off warfarin.  I discussed with Dr. Kirke Corin and advised to start Xarelto when INR becomes less than 2.0.  Her last INR at PCP was 06/09/11 and was 3.0.  She is not due for another coag check until 7/10.  She is also going for GI consult for possible colonoscopy same day (7/10).  She will find out that dsay when colonoscopy will be and how long to hold warfarin. This may be a good time to wean her off the warfarin.  Although, I am not sure if we should bridge with lovenox, timing of xarelto start, etc.    Alejandra Wiley is the nurse who calls her with instructions re: warfarin when this is checked at PCP.  I will call her and discuss plan as well.  In the meantime I will put samples at [facility] with RX.  I am also going to send message to Kennon Rounds, PharmD for advice as well.

## 2011-07-15 NOTE — Telephone Encounter (Signed)
Duke Salvia, MD - olson, [name] More Detail >>      olson, Alejandra Wiley      Duke Salvia, MD        Sent: Thu July 15, 2011 12:28 PM    To: Marcelle Overlie, RN              Message     Fine for 20    steve   LMTCB re: where to send RX

## 2011-07-16 NOTE — Telephone Encounter (Signed)
See phone note from 6/24 and 6/27 with follow from this phone call.

## 2011-07-16 NOTE — Telephone Encounter (Signed)
Will send note to Dr. Graciela Husbands to see if pt would need to be bridged.  If not, would hold Coumadin for colonoscopy and start Xarelto after procedure.  If she would need to be bridged, would consider switching to Xarelto prior to procedure so we would not have the need for Lovenox bridge around colonoscopy.    Dr. Graciela Husbands- Can you let us know if she would require bridging for the colonoscopy?  Thanks!

## 2011-07-19 NOTE — Telephone Encounter (Signed)
i have no record of a stroke and so i dont feel taht the patient needs bridging steve

## 2011-07-20 NOTE — Telephone Encounter (Signed)
See Dr. Odessa Fleming note.  Would start Xarelto after colonoscopy

## 2011-07-20 NOTE — Telephone Encounter (Signed)
Attempted to call Dr. Ewell Poe office to speak with [facility].  Office closed for lunch until 1 pm.  Will try back at that time.

## 2011-07-21 NOTE — Telephone Encounter (Signed)
Pt informed ok to hold warfarin as directed by GI MD for upcoming colonoscopy without bridge.  Per Dr. Graciela Husbands we will start Xarelto after colonoscopy.  She verb. Understanding and will be by to pick up samples.

## 2011-07-21 NOTE — Telephone Encounter (Signed)
LM with Dr. Ewell Poe office to have [place] call me back re: coumadin/xarelto start

## 2011-07-21 NOTE — Telephone Encounter (Signed)
I spoke w/Patti at Dr. Ewell Poe office. I informed her of pt's plan to have colonoscopy and to hold warfarin without bridge, per Dr. Odessa Fleming note.  I told Clayborne Dana we would be starting pt on Xarelto after colonoscopy.  Understanding verb.  She will let Dr. Dareen Piano know.

## 2011-07-28 ENCOUNTER — Telehealth: Payer: Self-pay

## 2011-07-28 NOTE — Telephone Encounter (Signed)
Received fax from [facility] asking for clearance for pt colonoscopy/ok to hold warfarin/any bridge needed.  I faxed back our last telephone correspondence with Dr. Graciela Husbands re: bridge not necessary, ok to hold warfarin.  Will start Xarelto after colonoscopy.

## 2011-09-29 ENCOUNTER — Ambulatory Visit: Payer: Self-pay | Admitting: Gastroenterology

## 2011-10-07 ENCOUNTER — Other Ambulatory Visit: Payer: Self-pay | Admitting: Internal Medicine

## 2011-11-26 ENCOUNTER — Other Ambulatory Visit: Payer: Self-pay | Admitting: Internal Medicine

## 2011-11-26 DIAGNOSIS — Z1231 Encounter for screening mammogram for malignant neoplasm of breast: Secondary | ICD-10-CM

## 2012-01-10 ENCOUNTER — Ambulatory Visit (INDEPENDENT_AMBULATORY_CARE_PROVIDER_SITE_OTHER): Payer: Medicare Other | Admitting: *Deleted

## 2012-01-10 DIAGNOSIS — I4891 Unspecified atrial fibrillation: Secondary | ICD-10-CM

## 2012-01-10 DIAGNOSIS — I498 Other specified cardiac arrhythmias: Secondary | ICD-10-CM

## 2012-01-10 LAB — PACEMAKER DEVICE OBSERVATION
BAMS-0001: 175 {beats}/min
RV LEAD AMPLITUDE: 11.2 mv
RV LEAD IMPEDENCE PM: 687 Ohm
VENTRICULAR PACING PM: 0

## 2012-01-10 NOTE — Progress Notes (Signed)
PPM check 

## 2012-01-26 ENCOUNTER — Ambulatory Visit: Payer: Medicare Other

## 2012-01-27 ENCOUNTER — Encounter: Payer: Self-pay | Admitting: Internal Medicine

## 2012-02-15 ENCOUNTER — Ambulatory Visit: Payer: Medicare Other

## 2012-02-17 ENCOUNTER — Ambulatory Visit
Admission: RE | Admit: 2012-02-17 | Discharge: 2012-02-17 | Disposition: A | Discharge status: Home / Self Care | Payer: Medicare Other | Source: Ambulatory Visit | Attending: Internal Medicine | Admitting: Internal Medicine

## 2012-02-17 DIAGNOSIS — Z1231 Encounter for screening mammogram for malignant neoplasm of breast: Secondary | ICD-10-CM

## 2012-02-23 ENCOUNTER — Other Ambulatory Visit: Payer: Self-pay | Admitting: Internal Medicine

## 2012-04-05 ENCOUNTER — Ambulatory Visit (INDEPENDENT_AMBULATORY_CARE_PROVIDER_SITE_OTHER): Payer: Medicare Other | Admitting: *Deleted

## 2012-04-05 ENCOUNTER — Encounter: Payer: Self-pay | Admitting: Internal Medicine

## 2012-04-05 ENCOUNTER — Other Ambulatory Visit: Payer: Self-pay

## 2012-04-05 DIAGNOSIS — I498 Other specified cardiac arrhythmias: Secondary | ICD-10-CM

## 2012-04-05 DIAGNOSIS — I4891 Unspecified atrial fibrillation: Secondary | ICD-10-CM

## 2012-04-05 LAB — PACEMAKER DEVICE OBSERVATION
AL AMPLITUDE: 2 mv
AL THRESHOLD: 0.75 V
BAMS-0001: 175 {beats}/min
RV LEAD AMPLITUDE: 11.2 mv
RV LEAD THRESHOLD: 0.75 V

## 2012-04-05 NOTE — Progress Notes (Signed)
PPM check 

## 2012-06-22 ENCOUNTER — Ambulatory Visit (INDEPENDENT_AMBULATORY_CARE_PROVIDER_SITE_OTHER): Payer: Medicare Other | Admitting: Internal Medicine

## 2012-06-22 ENCOUNTER — Encounter: Payer: Self-pay | Admitting: Internal Medicine

## 2012-06-22 VITALS — BP 118/72 | HR 63 | Ht 68.0 in | Wt 133.5 lb

## 2012-06-22 DIAGNOSIS — Z950 Presence of cardiac pacemaker: Secondary | ICD-10-CM

## 2012-06-22 DIAGNOSIS — I4891 Unspecified atrial fibrillation: Secondary | ICD-10-CM

## 2012-06-22 LAB — PACEMAKER DEVICE OBSERVATION
AL AMPLITUDE: 1.4 mv
AL IMPEDENCE PM: 853 Ohm
BAMS-0001: 175 {beats}/min
BATTERY VOLTAGE: 2.65 V
RV LEAD IMPEDENCE PM: 741 Ohm
VENTRICULAR PACING PM: 1

## 2012-06-22 NOTE — Assessment & Plan Note (Signed)
The patient's device was interrogated.  The information was reviewed. No changes were made in the programming.   She expressed a number frustrations about her device pocket including the palpation of the sewing sleeve wing as well as a protrusion of the device.  we discussed some muscular dislocation of the device at the time change out

## 2012-06-22 NOTE — Patient Instructions (Addendum)
Follow up with pacemaker clinic in 3 months.

## 2012-06-22 NOTE — Progress Notes (Signed)
Patient Care Team: Lauro Regulus, MD as PCP - General (Unknown Physician Specialty)   HPI  Alejandra Wiley is a 79 y.o. female seen in followup for palpitations associated with atrial arrhythmias as well as bradycardia requiring backup bradycardia pacing.  The patient denies chest pain, shortness of breath, nocturnal dyspnea, orthopnea or peripheral edema. There have been no palpitations, lightheadedness or syncope.  Echocardiogram  2103  demonstrated normal left ventricular function   Past Medical History  Diagnosis Date  . Pacemaker 12/06/2003    Medtronic Impulse DTDR01  . Bradycardia   . Arrhythmia     A-Fib  . Sinoatrial node dysfunction   . Sick sinus syndrome     Past Surgical History  Procedure Laterality Date  . Vaginal hysterectomy    . Leep  1999  . Insert / replace / remove pacemaker  12/06/2003    Medtronic impulse DTDR01    Current Outpatient Prescriptions  Medication Sig Dispense Refill  . B Complex Vitamins (B COMPLEX-B12) TABS Take by mouth 1 dose over 46 hours.        . calcium carbonate (TUMS EX) 750 MG chewable tablet Chew 1 tablet by mouth daily.      . cholecalciferol (VITAMIN D) 1000 UNITS tablet Take 1,000 Units by mouth daily.        . flecainide (TAMBOCOR) 50 MG tablet TAKE ONE AND ONE-HALF TABLETS BY MOUTH TWICE DAILY  270 tablet  2  . folic acid (FOLVITE) 400 MCG tablet Take 400 mcg by mouth daily.        Marland Kitchen GLUCOSAMINE PO Take 2,000 mg by mouth.      . Multiple Vitamins-Minerals (CENTURY SENIOR FORMULA PO) Take by mouth 1 dose over 46 hours.        . nadolol (CORGARD) 20 MG tablet Take 20 mg by mouth daily.        . Omega-3 Fatty Acids (FISH OIL) 1000 MG CAPS Take by mouth.        . Rivaroxaban (XARELTO) 20 MG TABS Take 1 tablet (20 mg total) by mouth daily.  90 tablet  3  . vitamin C (ASCORBIC ACID) 500 MG tablet Take 500 mg by mouth daily.       No current facility-administered medications for this visit.    No Known  Allergies  Review of Systems negative except from HPI and PMH  Physical Exam BP 118/72  Pulse 63  Ht 5\' 8"  (1.727 m)  Wt 133 lb 8 oz (60.555 kg)  BMI 20.3 kg/m2 Well developed and nourished in no acute distress HENT normal Neck supple with JVP-flat Clear Device pocket well healed; without hematoma or erythema  Regular rate and rhythm, no murmurs or gallops Abd-soft with active BS No Clubbing cyanosis edema Skin-warm and dry A & Oriented  Grossly normal sensory and motor function     Assessment and  Plan

## 2012-06-22 NOTE — Assessment & Plan Note (Signed)
No intercurrent Atrial fibrillation or flutter  

## 2012-07-12 ENCOUNTER — Encounter: Payer: Self-pay | Admitting: Internal Medicine

## 2012-07-17 ENCOUNTER — Other Ambulatory Visit: Payer: Self-pay

## 2012-07-17 MED ORDER — RIVAROXABAN 20 MG PO TABS: 20.0000 mg | ORAL_TABLET | Freq: Every day | ORAL | Status: DC

## 2012-07-17 NOTE — Telephone Encounter (Signed)
Refilled Xarelto sent to [facility] pharmacy.

## 2012-07-18 ENCOUNTER — Other Ambulatory Visit: Payer: Self-pay | Admitting: *Deleted

## 2012-07-18 ENCOUNTER — Other Ambulatory Visit: Payer: Self-pay

## 2012-07-18 MED ORDER — RIVAROXABAN 20 MG PO TABS: 20.0000 mg | ORAL_TABLET | Freq: Every day | ORAL | Status: DC

## 2012-07-18 NOTE — Telephone Encounter (Signed)
Refill sent for Xarelto to [facility] pharmacy.

## 2012-07-18 NOTE — Telephone Encounter (Signed)
Needs rx called to mail order

## 2012-08-14 ENCOUNTER — Other Ambulatory Visit: Payer: Self-pay | Admitting: *Deleted

## 2012-08-14 MED ORDER — FLECAINIDE ACETATE 50 MG PO TABS: ORAL_TABLET | ORAL | Status: DC

## 2012-09-12 ENCOUNTER — Telehealth: Payer: Self-pay

## 2012-09-12 NOTE — Telephone Encounter (Signed)
Spoke w/ Morrie Sheldon at [facility], who said that pt came in with listed complaints, but pt refused to see a physician today, or go to ED.  I called the pt, who stated that she "feels like she's got nothing going on", when she gets up in a hurry, she gets dizzy, and has had a "constant, aggravating headache for 2 days".  She is taking Xarelto since 07/18/12 and has recently noticed that her gums bleed when she brushes her teeth, she has noticed a "small amount of blood in the stuff that she coughs up" and when she blows her nose.  She takes her Xarelto in the evening, recently filled her prescription, and wanted to let us know that it is "very expensive".  Pt adamantly refuses to go to the ED or see another physician until she speaks w/ Dr. Graciela Husbands.

## 2012-09-12 NOTE — Telephone Encounter (Signed)
Per Dr. Graciela Husbands pt is to stop Xarelto. Pt is agreeable to this. Will discuss her going out of town next week with Dr. Graciela Husbands per her request and get back with her tomorrow.

## 2012-09-12 NOTE — Telephone Encounter (Signed)
Nurse with [facility] called states pt has increased dizziness, headache, orthostatic hypertension, 126/70 sitting, standing 108/68, HR 62. Pt states that Dr Graciela Husbands said her pacer battery may need changing, nurse not sure if she needs to see dr Graciela Husbands. Please advise.

## 2012-09-13 ENCOUNTER — Ambulatory Visit: Payer: Self-pay | Admitting: Internal Medicine

## 2012-09-13 ENCOUNTER — Telehealth: Payer: Self-pay | Admitting: *Deleted

## 2012-09-13 NOTE — Telephone Encounter (Signed)
Patient called stating Dr. Graciela Husbands wants her set up for CT scan of her head.

## 2012-09-13 NOTE — Telephone Encounter (Signed)
Pt sched for Head CT w/o contrast at [facility] at 3:00 today.

## 2012-09-13 NOTE — Telephone Encounter (Signed)
Call received from Ct/ head CT was negative for bleed, chronic left maxillary sinusitis. VM was left for pt/ negative for bleed, call with further questions.

## 2012-09-13 NOTE — Telephone Encounter (Signed)
Pt aware of appt for CT.

## 2012-09-13 NOTE — Telephone Encounter (Signed)
Called pt spoke w her hysband  Who confirmed oral bleeding and new headache He thinks she stopped her Rivaroxaban yday,  We will arrange a head CT this afternoon  she is to call [facility] when she returns from the hairdresser

## 2012-09-14 ENCOUNTER — Telehealth: Payer: Self-pay | Admitting: *Deleted

## 2012-09-14 NOTE — Telephone Encounter (Signed)
Patient is going out of town and she has some medication questions. Dr. Graciela Husbands took her off [place]. She is not sure if her put her on another medication. Please advise.

## 2012-09-14 NOTE — Telephone Encounter (Signed)
I spoke with the patient. I made her aware that I am not certain what Dr. Graciela Husbands wants to do in regards to her blood thinner. I will discuss this with him and call her back. She uses [facility] on [address]. She will going out of town next week. In regards to her symptoms, her bleeding has stopped and her headache is much improved from Monday.

## 2012-09-16 ENCOUNTER — Emergency Department: Payer: Self-pay | Admitting: Emergency Medicine

## 2012-09-16 ENCOUNTER — Telehealth: Payer: Self-pay | Admitting: Internal Medicine

## 2012-09-16 LAB — COMPREHENSIVE METABOLIC PANEL
Alkaline Phosphatase: 75 U/L (ref 50–136)
BUN: 18 mg/dL (ref 7–18)
Bilirubin,Total: 0.3 mg/dL (ref 0.2–1.0)
Calcium, Total: 8.9 mg/dL (ref 8.5–10.1)
Chloride: 105 mmol/L (ref 98–107)
Creatinine: 0.65 mg/dL (ref 0.60–1.30)
EGFR (Non-African Amer.): >60
Glucose: 96 mg/dL (ref 65–99)
Potassium: 5.9 mmol/L — ABNORMAL HIGH (ref 3.5–5.1)
SGPT (ALT): 30 U/L (ref 12–78)
Total Protein: 7.8 g/dL (ref 6.4–8.2)

## 2012-09-16 LAB — URINALYSIS, COMPLETE
Glucose,UR: NEGATIVE mg/dL (ref 0–75)
Ketone: NEGATIVE
Ph: 6 (ref 4.5–8.0)
RBC,UR: 21 /HPF (ref 0–5)

## 2012-09-16 LAB — CBC
MCH: 31.3 pg (ref 26.0–34.0)
MCV: 92 fL (ref 80–100)
Platelet: 203 10*3/uL (ref 150–440)
RBC: 4.05 10*6/uL (ref 3.80–5.20)
WBC: 8.3 10*3/uL (ref 3.6–11.0)

## 2012-09-16 LAB — CK TOTAL AND CKMB (NOT AT ARMC): CK-MB: 1 ng/mL (ref 0.5–3.6)

## 2012-09-16 NOTE — Telephone Encounter (Signed)
As with the patient. The bleeding issue has resolved. We will see resume her back on warfarin.  She continues to feel lousy however dating back to last Monday. This has not changed.  Her pacemaker when evaluated in June had estimated several months of longevity with a range of 1-16 month. It is possible that she has reverted to VVI 65 to cause her symptoms. She will work on trying to get an accurate heart rate measurements with exertion over the next 24 hours. In the event that we cannot document a heart rate of over 65, we'll have her come into the office on Tuesday at which time her device to be interrogated, given the fact that is an Enrhythm device,  it can be reprogrammed.She is anticipating  a long trtip on Wednesday.rip

## 2012-09-17 ENCOUNTER — Other Ambulatory Visit: Payer: Self-pay | Admitting: Internal Medicine

## 2012-09-17 DIAGNOSIS — I498 Other specified cardiac arrhythmias: Secondary | ICD-10-CM

## 2012-09-17 NOTE — Telephone Encounter (Signed)
Patient Care Team: Lauro Regulus, MD as PCP - General (Unknown Physician Specialty)   HPI  Alejandra Wiley is a 79 y.o. female callled and seen because of a general sense of lassitude that began earlier this week. She was seen at [facility] yesterday which confirmed that she had reverted to VVI pacing. Potassium level was 5.9. I cannot find records to see whether this was addressed.  She has a history of sinus bradycardia and paroxysmal atrial fibrillation.  She was recently started on Rivaroxaban which she was unable to tolerate because of gingival bleeding; she is not yet resume her warfarin   Past Medical History  Diagnosis Date  . Pacemaker 12/06/2003    Medtronic Impulse DTDR01  . Bradycardia   . Arrhythmia     A-Fib  . Sinoatrial node dysfunction   . Sick sinus syndrome     Past Surgical History  Procedure Laterality Date  . Vaginal hysterectomy    . Leep  1999  . Insert / replace / remove pacemaker  12/06/2003    Medtronic impulse DTDR01    Current Outpatient Prescriptions  Medication Sig Dispense Refill  . B Complex Vitamins (B COMPLEX-B12) TABS Take by mouth 1 dose over 46 hours.        . calcium carbonate (TUMS EX) 750 MG chewable tablet Chew 1 tablet by mouth daily.      . cholecalciferol (VITAMIN D) 1000 UNITS tablet Take 1,000 Units by mouth daily.        . flecainide (TAMBOCOR) 50 MG tablet TAKE ONE AND ONE-HALF TABLETS BY MOUTH TWICE DAILY  270 tablet  2  . folic acid (FOLVITE) 400 MCG tablet Take 400 mcg by mouth daily.        Marland Kitchen GLUCOSAMINE PO Take 2,000 mg by mouth.      . Multiple Vitamins-Minerals (CENTURY SENIOR FORMULA PO) Take by mouth 1 dose over 46 hours.        . nadolol (CORGARD) 20 MG tablet Take 20 mg by mouth daily.        . Omega-3 Fatty Acids (FISH OIL) 1000 MG CAPS Take by mouth.        . Rivaroxaban (XARELTO) 20 MG TABS Take 1 tablet (20 mg total) by mouth daily.  90 tablet  3  . vitamin C (ASCORBIC ACID) 500 MG tablet Take 500 mg by mouth  daily.       No current facility-administered medications for this visit.    No Known Allergies  Review of Systems negative except from HPI and PMH  Physical Exam Device has reached ERI. ll developed and well nourished in no acute distress HENT normal E scleral and icterus clear Neck Supple JVP flat; carotids brisk and full Clear to ausculation  Regular rate and rhythm, no murmurs gallops or rub Soft with active bowel sounds No clubbing cyanosis none Edema Alert and oriented, grossly normal motor and sensory function Skin Warm and Dry    Assessment and  Plan   device at ERI. She'll need to undergo device generator replacement. We have discussed potential risks and benefits including but not limited to infection she understands these risks and is willing to proceed.  Hyperkalemia: Noted yesterday at [facility]; she is not on any potassium supplementation. We'll recheck the values tomorrow morning.  Sinus node dysfunction: Will be addressed with pacemaker generator replacement

## 2012-09-18 ENCOUNTER — Observation Stay (HOSPITAL_COMMUNITY)
Admission: EM | Admit: 2012-09-18 | Discharge: 2012-09-18 | Disposition: A | Discharge status: Home / Self Care | Payer: Medicare Other | Attending: Internal Medicine | Admitting: Internal Medicine

## 2012-09-18 ENCOUNTER — Encounter (HOSPITAL_COMMUNITY): Payer: Self-pay

## 2012-09-18 ENCOUNTER — Ambulatory Visit (HOSPITAL_COMMUNITY): Admit: 2012-09-18 | Payer: Self-pay | Admitting: Internal Medicine

## 2012-09-18 ENCOUNTER — Encounter (HOSPITAL_COMMUNITY)
Admission: EM | Disposition: A | Discharge status: Home / Self Care | Payer: Self-pay | Source: Home / Self Care | Attending: Emergency Medicine

## 2012-09-18 DIAGNOSIS — Z950 Presence of cardiac pacemaker: Secondary | ICD-10-CM | POA: Insufficient documentation

## 2012-09-18 DIAGNOSIS — I4891 Unspecified atrial fibrillation: Secondary | ICD-10-CM | POA: Insufficient documentation

## 2012-09-18 DIAGNOSIS — I495 Sick sinus syndrome: Secondary | ICD-10-CM

## 2012-09-18 DIAGNOSIS — I498 Other specified cardiac arrhythmias: Secondary | ICD-10-CM | POA: Insufficient documentation

## 2012-09-18 DIAGNOSIS — Z79899 Other long term (current) drug therapy: Secondary | ICD-10-CM | POA: Insufficient documentation

## 2012-09-18 DIAGNOSIS — Z45018 Encounter for adjustment and management of other part of cardiac pacemaker: Principal | ICD-10-CM | POA: Insufficient documentation

## 2012-09-18 HISTORY — PX: PACEMAKER GENERATOR CHANGE: SHX5481

## 2012-09-18 LAB — BASIC METABOLIC PANEL
BUN: 19 mg/dL (ref 6–23)
CO2: 22 mEq/L (ref 19–32)
Chloride: 105 mEq/L (ref 96–112)
Creatinine, Ser: 0.91 mg/dL (ref 0.50–1.10)
GFR calc Af Amer: 69 mL/min — ABNORMAL LOW (ref >90)

## 2012-09-18 LAB — TYPE AND SCREEN
ABO/RH(D): O POS
Antibody Screen: NEGATIVE

## 2012-09-18 LAB — CBC
HCT: 34.2 % — ABNORMAL LOW (ref 36.0–46.0)
MCV: 91.4 fL (ref 78.0–100.0)
RDW: 13.8 % (ref 11.5–15.5)
WBC: 5.5 10*3/uL (ref 4.0–10.5)

## 2012-09-18 LAB — ABO/RH: ABO/RH(D): O POS

## 2012-09-18 SURGERY — PACEMAKER GENERATOR CHANGE
Anesthesia: LOCAL

## 2012-09-18 MED ORDER — SODIUM CHLORIDE 0.9 % IV SOLN: INTRAVENOUS | Status: AC

## 2012-09-18 MED ORDER — ACETAMINOPHEN 325 MG PO TABS: 325.0000 mg | ORAL_TABLET | ORAL | Status: DC | PRN

## 2012-09-18 MED ORDER — CEFAZOLIN SODIUM-DEXTROSE 2-3 GM-% IV SOLR
2.0000 g | INTRAVENOUS | Status: DC
  Filled 2012-09-18: qty 50

## 2012-09-18 MED ORDER — FENTANYL CITRATE 0.05 MG/ML IJ SOLN
INTRAMUSCULAR | Status: AC
  Filled 2012-09-18: qty 2

## 2012-09-18 MED ORDER — ONDANSETRON HCL 4 MG/2ML IJ SOLN: 4.0000 mg | Freq: Four times a day (QID) | INTRAMUSCULAR | Status: DC | PRN

## 2012-09-18 MED ORDER — LIDOCAINE HCL (PF) 1 % IJ SOLN
INTRAMUSCULAR | Status: AC
  Filled 2012-09-18: qty 60

## 2012-09-18 MED ORDER — SODIUM CHLORIDE 0.9 % IR SOLN
80.0000 mg | Status: DC
  Filled 2012-09-18: qty 2

## 2012-09-18 MED ORDER — MIDAZOLAM HCL 5 MG/5ML IJ SOLN
INTRAMUSCULAR | Status: AC
  Filled 2012-09-18: qty 5

## 2012-09-18 NOTE — Progress Notes (Signed)
Informed by nurse pt is a discharge. Dr. Graciela Husbands has already completed med rec and called for appts. Pt informed to expect a call from our office with appts. Pacemaker sheet added to AVS. Since pt was an outpt, does not need a dc summary. Brannon Decaire PA-C

## 2012-09-18 NOTE — ED Notes (Signed)
Patient presents to ED via POV. Pt is seen by Dr. Clide Cliff with cardiology- pt was told by him a week ago that her pacemaker had "flipped" and was no longer functioning properly. Pt was told by Dr. Clide Cliff to check into the ED at 6 am today and that he would be doing surgery at approx 8 am. Pt presents with generalized weakness- and has had this since the pacemaker stopped working properly. A&Ox4. Denies any pain.

## 2012-09-18 NOTE — H&P (Signed)
HPI  Alejandra Wiley is a 79 y.o. female  callled and seen because of a general sense of lassitude that began earlier this week. She was seen at [facility] ER yesterday which confirmed that she had reverted to VVI pacing. Potassium level was 5.9. I cannot find records to see whether this was addressed.  She has a history of sinus bradycardia and paroxysmal atrial fibrillation.  She was recently started on Rivaroxaban which she was unable to tolerate because of gingival bleeding; she is not yet resume her warfarin    Past Medical History    Diagnosis  Date    .  Pacemaker  12/06/2003      Medtronic Impulse DTDR01    .  Bradycardia     .  Arrhythmia       A-Fib    .  Sinoatrial node dysfunction     .  Sick sinus syndrome         Past Surgical History     Procedure  Laterality  Date     .  Vaginal hysterectomy       .  Leep   1999     .  Insert / replace / remove pacemaker   12/06/2003       Medtronic impulse DTDR01     Current Outpatient Prescriptions     Medication  Sig  Dispense  Refill     .  B Complex Vitamins (B COMPLEX-B12) TABS  Take by mouth 1 dose over 46 hours.       .  calcium carbonate (TUMS EX) 750 MG chewable tablet  Chew 1 tablet by mouth daily.       .  cholecalciferol (VITAMIN D) 1000 UNITS tablet  Take 1,000 Units by mouth daily.       .  flecainide (TAMBOCOR) 50 MG tablet  TAKE ONE AND ONE-HALF TABLETS BY MOUTH TWICE DAILY  270 tablet  2     .  folic acid (FOLVITE) 400 MCG tablet  Take 400 mcg by mouth daily.       Marland Kitchen  GLUCOSAMINE PO  Take 2,000 mg by mouth.       .  Multiple Vitamins-Minerals (CENTURY SENIOR FORMULA PO)  Take by mouth 1 dose over 46 hours.       .  nadolol (CORGARD) 20 MG tablet  Take 20 mg by mouth daily.       .  Omega-3 Fatty Acids (FISH OIL) 1000 MG CAPS  Take by mouth.       .  Rivaroxaban (XARELTO) 20 MG TABS  Take 1 tablet (20 mg total) by mouth daily.  90 tablet  3     .  vitamin C (ASCORBIC ACID) 500 MG tablet  Take 500 mg by mouth daily.       No  current facility-administered medications for this visit.      No Known Allergies  Review of Systems negative except from HPI and PMH  Physical Exam  Device has reached ERI. ll developed and well nourished in no acute distress  HENT normal  E scleral and icterus clear  Neck Supple  JVP flat; carotids brisk and full  Clear to ausculation  Regular rate and rhythm, no murmurs gallops or rub  Soft with active bowel sounds  No clubbing cyanosis none Edema  Alert and oriented, grossly normal motor and sensory function  Skin Warm and Dry  Assessment and Plan  device at ERI. She'll need to undergo device generator  replacement. We have discussed potential risks and benefits including but not limited to infection she understands these risks and is willing to proceed.  Hyperkalemia: Noted yesterday at [facility]; she is not on any potassium supplementation. We'll recheck the values tomorrow morning.  Sinus node dysfunction: Will be addressed with pacemaker generator replacement        Duke Salvia, MD at 09/16/2012 4:56 PM    Status: Signed                Sherryl Manges, MD 09/18/2012 7:31 AM

## 2012-09-18 NOTE — ED Provider Notes (Signed)
CSN: [id number]     Arrival date & time 09/18/12  0612 History   First MD Initiated Contact with Patient 09/18/12 [phone]     Chief Complaint  Patient presents with  . Pacemaker Problem   (Consider location/radiation/quality/duration/timing/severity/associated sxs/prior Treatment) HPI This patient is a very pleasant elderly woman with a Medtronic pacemaker which was placed proximally 9 years ago as treatment for sick sinus syndrome and atrial fibrillation. The patient presents to the emergency department and says that Dr. Graciela Husbands told her to come to the emergency department at 6 AM today showed that she did have surgery at 8 AM for pacemaker replacement. Evidently, the patient's pacemaker has stopped working.  The patient says she felt somewhat weak yesterday. She was seen by Dr. Graciela Husbands yesterday. She is without complaints at this time. She has been n.p.o. since the night  Past Medical History  Diagnosis Date  . Pacemaker 12/06/2003    Medtronic Impulse DTDR01  . Bradycardia   . Arrhythmia     A-Fib  . Sinoatrial node dysfunction   . Sick sinus syndrome    Past Surgical History  Procedure Laterality Date  . Vaginal hysterectomy    . Leep  1999  . Insert / replace / remove pacemaker  12/06/2003    Medtronic impulse DTDR01   No family history on file. History  Substance Use Topics  . Smoking status: Unknown If Ever Smoked  . Smokeless tobacco: Never Used  . Alcohol Use: Yes     Comment: occasionally   OB History   Grav Para Term Preterm Abortions TAB SAB Ect Mult Living                 Review of Systems Gen: As per history of present illness, otherwise negative Eyes: Negative Nose: There Mouth: no dental pain, no sore throat Neck: no neck pain Lungs: no SOB, cough, wheezing CV: no chest pain, palpitations, dependent edema or orthopnea Abd: Negative GU: Negative MSK: Negative Neuro: Negative Skin: no rash Psyche: negative.  Allergies  Review of patient's allergies  indicates no known allergies.  Home Medications   Current Outpatient Rx  Name  Route  Sig  Dispense  Refill  . B Complex Vitamins (B COMPLEX-B12) TABS   Oral   Take by mouth 1 dose over 46 hours.           . calcium carbonate (TUMS EX) 750 MG chewable tablet   Oral   Chew 1 tablet by mouth daily.         . cholecalciferol (VITAMIN D) 1000 UNITS tablet   Oral   Take 1,000 Units by mouth daily.           . folic acid (FOLVITE) 400 MCG tablet   Oral   Take 400 mcg by mouth daily.           Marland Kitchen GLUCOSAMINE PO   Oral   Take 2,000 mg by mouth.         . Multiple Vitamins-Minerals (CENTURY SENIOR FORMULA PO)   Oral   Take by mouth 1 dose over 46 hours.           . nadolol (CORGARD) 20 MG tablet   Oral   Take 20 mg by mouth daily.            BP 127/81  Pulse 68  Temp(Src) 97.6 F (36.4 C) (Oral)  SpO2 100% Physical Exam Gen: well developed and well nourished appearing Head: NCAT Eyes: PERL, EOMI Nose:  no epistaixis  Neck: normal to inspection Lungs: CTA B, no wheezing, rhonchi or rales CV: RRR Abd: normal to inspection Back: normal to inspection Skin: no skin lesions appreciated Neuro: CN ii-xii grossly intact, no focal deficits Psyche; normal affect,  calm and cooperative.   ED Course  Procedures (including critical care time) No results found for this or any previous visit (from the past 24 hour(s)).   MDM  Patient with pacemaker malfunction to have replacement surgery with Dr. Clide Cliff. Surgical team has been called and pre-op studies ordered. Patient with NSR and normal BP.     Brandt Loosen, MD 09/18/12 [phone]

## 2012-09-18 NOTE — CV Procedure (Signed)
Preoperative diagnosis sinus node dysfunction at ERI with reversion Postoperative diagnosis same/   Procedure: Generator replacement     Following informed consent the patient was brought to the electrophysiology laboratory in place of the fluoroscopic table in the supine position after routine prep and drape lidocaine was infiltrated in the region of the previous incision and carried down to later the device pocket using sharp dissection and electrocautery. The pocket was opened the device was freed up and was explanted.  Interrogation of the previously implanted ventricular lead Medtronic 5076  demonstrated an R wave of 7.8  millivolts., and impedance of 604 ohms, and a pacing threshold of 1.0 volts at 0.5 msec.    The previously implanted atrial lead St Jude 1642 demonstrated a P-wave amplitude of 1.2 milllivolts  and impedance of  499 ohms, and a pacing threshold of 1.0 volts at @ 0.31milliseconds.  The leads were inspected.the ventricular lead was discolored and adhesive was placed along its length The leads were then attached to a Medtronic adaptaL pulse generator, serial number [id number].    The pocket was irrigated with antibiotic containing saline solution hemostasis was assured and the leads and the device were placed in the pocket. The device was secured and surgicell placed int he pocket The wound was then closed in 3 layers in normal fashion.  Dermabond dressing was applied  The patient tolerated the procedure without apparent complication.  Sherryl Manges

## 2012-09-18 NOTE — Interval H&P Note (Signed)
History and Physical Interval Note:  09/18/2012 7:31 AM  Alejandra Wiley  has presented today for surgery, with the diagnosis of pacemaker end of life  The various methods of treatment have been discussed with the patient and family. After consideration of risks, benefits and other options for treatment, the patient has consented to  Procedure(s): PACEMAKER GENERATOR CHANGE (N/A) as a surgical intervention .  The patient's history has been reviewed, patient examined, no change in status, stable for surgery.  I have reviewed the patient's chart and labs.  Questions were answered to the patient's satisfaction.     Sherryl Manges

## 2012-10-09 ENCOUNTER — Encounter: Payer: Self-pay | Admitting: Internal Medicine

## 2012-10-13 ENCOUNTER — Ambulatory Visit (INDEPENDENT_AMBULATORY_CARE_PROVIDER_SITE_OTHER): Payer: Medicare Other | Admitting: *Deleted

## 2012-10-13 DIAGNOSIS — I498 Other specified cardiac arrhythmias: Secondary | ICD-10-CM

## 2012-10-13 DIAGNOSIS — I4891 Unspecified atrial fibrillation: Secondary | ICD-10-CM

## 2012-10-13 LAB — PACEMAKER DEVICE OBSERVATION
AL AMPLITUDE: 2 mv
AL IMPEDENCE PM: 652 Ohm
BATTERY VOLTAGE: 2.8 V
RV LEAD IMPEDENCE PM: 747 Ohm
RV LEAD THRESHOLD: 1 V
VENTRICULAR PACING PM: 97

## 2012-10-13 NOTE — Progress Notes (Signed)
Wound check-PPM in office. 

## 2012-10-21 ENCOUNTER — Encounter: Payer: Self-pay | Admitting: Internal Medicine

## 2013-01-15 ENCOUNTER — Ambulatory Visit (INDEPENDENT_AMBULATORY_CARE_PROVIDER_SITE_OTHER): Payer: Medicare Other | Admitting: *Deleted

## 2013-01-15 ENCOUNTER — Encounter: Payer: Self-pay | Admitting: Internal Medicine

## 2013-01-15 DIAGNOSIS — I498 Other specified cardiac arrhythmias: Secondary | ICD-10-CM

## 2013-01-15 DIAGNOSIS — I4891 Unspecified atrial fibrillation: Secondary | ICD-10-CM

## 2013-01-16 LAB — MDC_IDC_ENUM_SESS_TYPE_REMOTE
Battery Remaining Longevity: 134 mo
Brady Statistic AS VS Percent: 0 %
Date Time Interrogation Session: 20141229222721
Lead Channel Impedance Value: 706 Ohm
Lead Channel Pacing Threshold Amplitude: 0.875 V
Lead Channel Pacing Threshold Pulse Width: 0.4 ms
Lead Channel Setting Sensing Sensitivity: 4 mV

## 2013-01-18 HISTORY — PX: IMAGE GUIDED SINUS SURGERY: SHX6570

## 2013-01-31 ENCOUNTER — Encounter: Payer: Self-pay | Admitting: *Deleted

## 2013-02-23 ENCOUNTER — Other Ambulatory Visit: Payer: Self-pay

## 2013-02-23 DIAGNOSIS — Z1231 Encounter for screening mammogram for malignant neoplasm of breast: Secondary | ICD-10-CM

## 2013-04-19 ENCOUNTER — Encounter: Payer: Self-pay | Admitting: Internal Medicine

## 2013-04-19 ENCOUNTER — Ambulatory Visit (INDEPENDENT_AMBULATORY_CARE_PROVIDER_SITE_OTHER): Payer: Medicare Other | Admitting: *Deleted

## 2013-04-19 DIAGNOSIS — I4891 Unspecified atrial fibrillation: Secondary | ICD-10-CM

## 2013-04-19 DIAGNOSIS — Z950 Presence of cardiac pacemaker: Secondary | ICD-10-CM

## 2013-04-19 DIAGNOSIS — I498 Other specified cardiac arrhythmias: Secondary | ICD-10-CM

## 2013-04-24 LAB — MDC_IDC_ENUM_SESS_TYPE_REMOTE
Battery Impedance: 100 Ohm
Brady Statistic AP VS Percent: 1 %
Brady Statistic AS VP Percent: 3 %
Brady Statistic AS VS Percent: 0 %
Lead Channel Impedance Value: 802 Ohm
Lead Channel Pacing Threshold Amplitude: 0.875 V
Lead Channel Pacing Threshold Amplitude: 1.5 V
Lead Channel Pacing Threshold Pulse Width: 0.4 ms
Lead Channel Pacing Threshold Pulse Width: 0.4 ms
Lead Channel Setting Pacing Pulse Width: 0.4 ms
MDC IDC MSMT BATTERY REMAINING LONGEVITY: 123 mo
MDC IDC MSMT BATTERY VOLTAGE: 2.79 V
MDC IDC MSMT LEADCHNL RV IMPEDANCE VALUE: 745 Ohm
MDC IDC SESS DTM: 20150402165803
MDC IDC SET LEADCHNL RA PACING AMPLITUDE: 3 V
MDC IDC SET LEADCHNL RV PACING AMPLITUDE: 2.5 V
MDC IDC SET LEADCHNL RV SENSING SENSITIVITY: 4 mV
MDC IDC STAT BRADY AP VP PERCENT: 96 %

## 2013-04-30 ENCOUNTER — Ambulatory Visit: Payer: Medicare Other

## 2013-05-02 ENCOUNTER — Ambulatory Visit: Payer: Medicare HMO

## 2013-05-17 ENCOUNTER — Encounter: Payer: Self-pay | Admitting: *Deleted

## 2013-05-25 ENCOUNTER — Other Ambulatory Visit: Payer: Self-pay | Admitting: Internal Medicine

## 2013-05-31 ENCOUNTER — Encounter (INDEPENDENT_AMBULATORY_CARE_PROVIDER_SITE_OTHER): Payer: Self-pay

## 2013-05-31 ENCOUNTER — Ambulatory Visit
Admission: RE | Admit: 2013-05-31 | Discharge: 2013-05-31 | Disposition: A | Discharge status: Home / Self Care | Payer: Medicare HMO | Source: Ambulatory Visit

## 2013-05-31 DIAGNOSIS — Z1231 Encounter for screening mammogram for malignant neoplasm of breast: Secondary | ICD-10-CM

## 2013-07-16 ENCOUNTER — Telehealth: Payer: Self-pay

## 2013-07-16 NOTE — Telephone Encounter (Signed)
Please call patient regarding her [place] insurance because its time for her to be seen by Dr. Caryl Comes.

## 2013-07-23 ENCOUNTER — Encounter: Payer: Self-pay | Admitting: Internal Medicine

## 2013-07-23 ENCOUNTER — Telehealth: Payer: Self-pay | Admitting: Cardiology

## 2013-07-23 ENCOUNTER — Ambulatory Visit (INDEPENDENT_AMBULATORY_CARE_PROVIDER_SITE_OTHER): Payer: Medicare HMO | Admitting: *Deleted

## 2013-07-23 DIAGNOSIS — I4891 Unspecified atrial fibrillation: Secondary | ICD-10-CM

## 2013-07-23 DIAGNOSIS — Z950 Presence of cardiac pacemaker: Secondary | ICD-10-CM

## 2013-07-23 DIAGNOSIS — I498 Other specified cardiac arrhythmias: Secondary | ICD-10-CM

## 2013-07-23 NOTE — Telephone Encounter (Signed)
Confirmed remote transmission with pt husband.  

## 2013-07-24 NOTE — Progress Notes (Signed)
Remote pacemaker transmission.   

## 2013-07-29 LAB — MDC_IDC_ENUM_SESS_TYPE_REMOTE
Battery Impedance: 112 Ohm
Battery Remaining Longevity: 131 mo
Battery Voltage: 2.79 V
Brady Statistic AP VP Percent: 95 %
Brady Statistic AS VP Percent: 3 %
Date Time Interrogation Session: 20150706192259
Lead Channel Impedance Value: 669 Ohm
Lead Channel Impedance Value: 754 Ohm
Lead Channel Setting Pacing Amplitude: 2.5 V
Lead Channel Setting Pacing Pulse Width: 0.4 ms
Lead Channel Setting Sensing Sensitivity: 4 mV
MDC IDC MSMT LEADCHNL RA PACING THRESHOLD AMPLITUDE: 0.75 V
MDC IDC MSMT LEADCHNL RA PACING THRESHOLD PULSEWIDTH: 0.4 ms
MDC IDC MSMT LEADCHNL RV PACING THRESHOLD AMPLITUDE: 1 V
MDC IDC MSMT LEADCHNL RV PACING THRESHOLD PULSEWIDTH: 0.4 ms
MDC IDC SET LEADCHNL RA PACING AMPLITUDE: 2 V
MDC IDC STAT BRADY AP VS PERCENT: 1 %
MDC IDC STAT BRADY AS VS PERCENT: 1 %

## 2013-08-21 ENCOUNTER — Telehealth: Payer: Self-pay | Admitting: *Deleted

## 2013-08-21 NOTE — Telephone Encounter (Signed)
Needing cardiac clearance on Alejandra Wiley.  Having sinus surgery on 08/31/13. Faxed 08/09/13 please fax back to fax [phone].

## 2013-08-21 NOTE — Telephone Encounter (Signed)
Needing cardiac clearance on Alejandra Wiley. Having sinus surgery on 08/31/13. Faxed 08/09/13 please fax back to fax [phone].

## 2013-08-22 ENCOUNTER — Telehealth: Payer: Self-pay

## 2013-08-22 ENCOUNTER — Telehealth: Payer: Self-pay | Admitting: *Deleted

## 2013-08-22 ENCOUNTER — Encounter: Payer: Self-pay | Admitting: Cardiology

## 2013-08-22 NOTE — Telephone Encounter (Signed)
Please call patient. She is having sinus surgery next week and needs to know how long to be off her medications.

## 2013-08-22 NOTE — Telephone Encounter (Signed)
Nurse with [facility] needs some information regarding pt for upcoming surgery. Please call.

## 2013-08-22 NOTE — Telephone Encounter (Signed)
Needs

## 2013-08-23 NOTE — Telephone Encounter (Signed)
Faxed cardiac clearance  

## 2013-08-24 ENCOUNTER — Inpatient Hospital Stay: Payer: Self-pay | Admitting: Internal Medicine

## 2013-08-24 LAB — CBC
HCT: 35.1 % (ref 35.0–47.0)
HGB: 11.8 g/dL — ABNORMAL LOW (ref 12.0–16.0)
MCH: 31.3 pg (ref 26.0–34.0)
MCHC: 33.7 g/dL (ref 32.0–36.0)
MCV: 93 fL (ref 80–100)
Platelet: 206 10*3/uL (ref 150–440)
RBC: 3.77 10*6/uL — AB (ref 3.80–5.20)
RDW: 14.1 % (ref 11.5–14.5)
WBC: 8.1 10*3/uL (ref 3.6–11.0)

## 2013-08-24 LAB — COMPREHENSIVE METABOLIC PANEL
ALK PHOS: 80 U/L
ALT: 44 U/L
ANION GAP: 6 — AB (ref 7–16)
Albumin: 3.5 g/dL (ref 3.4–5.0)
BUN: 19 mg/dL — ABNORMAL HIGH (ref 7–18)
Bilirubin,Total: 0.4 mg/dL (ref 0.2–1.0)
CHLORIDE: 104 mmol/L (ref 98–107)
CREATININE: 0.81 mg/dL (ref 0.60–1.30)
Calcium, Total: 8.5 mg/dL (ref 8.5–10.1)
Co2: 29 mmol/L (ref 21–32)
GLUCOSE: 133 mg/dL — AB (ref 65–99)
Osmolality: 282 (ref 275–301)
Potassium: 4.2 mmol/L (ref 3.5–5.1)
SGOT(AST): 45 U/L — ABNORMAL HIGH (ref 15–37)
Sodium: 139 mmol/L (ref 136–145)
Total Protein: 7.6 g/dL (ref 6.4–8.2)

## 2013-08-24 LAB — PROTIME-INR
INR: 9.7
PROTHROMBIN TIME: 74.4 s — AB (ref 11.5–14.7)

## 2013-08-25 LAB — PROTIME-INR
INR: 2.3
PROTHROMBIN TIME: 24.5 s — AB (ref 11.5–14.7)

## 2013-08-25 LAB — CBC WITH DIFFERENTIAL/PLATELET
BASOS ABS: 0.1 10*3/uL (ref 0.0–0.1)
BASOS PCT: 0.8 %
EOS PCT: 3.4 %
Eosinophil #: 0.2 10*3/uL (ref 0.0–0.7)
HCT: 34 % — AB (ref 35.0–47.0)
HGB: 11.5 g/dL — ABNORMAL LOW (ref 12.0–16.0)
Lymphocyte #: 2.7 10*3/uL (ref 1.0–3.6)
Lymphocyte %: 40.1 %
MCH: 30.8 pg (ref 26.0–34.0)
MCHC: 33.7 g/dL (ref 32.0–36.0)
MCV: 91 fL (ref 80–100)
Monocyte #: 0.8 x10 3/mm (ref 0.2–0.9)
Monocyte %: 12.6 %
NEUTROS PCT: 43.1 %
Neutrophil #: 2.9 10*3/uL (ref 1.4–6.5)
PLATELETS: 183 10*3/uL (ref 150–440)
RBC: 3.73 10*6/uL — AB (ref 3.80–5.20)
RDW: 14.1 % (ref 11.5–14.5)
WBC: 6.7 10*3/uL (ref 3.6–11.0)

## 2013-08-27 ENCOUNTER — Telehealth: Payer: Self-pay | Admitting: *Deleted

## 2013-08-27 NOTE — Telephone Encounter (Signed)
Patient called and stated that she is having sinus surgery  Dr. Caryl Comes stated on her clearance that she can hold coumadin as needed  Patient wants to make sure that she does not need a Lovenox bridge  She also wants a more precise amount of time to hold coumadin

## 2013-08-27 NOTE — Telephone Encounter (Signed)
LVM 8/10

## 2013-08-28 NOTE — Telephone Encounter (Signed)
Informed patient that cardiac clearance was refaxed  Dr. Caryl Comes recommends (via telephone conversation) that patient can hold coumadin for 4 days and does not need Lovenox bridge  Patient stated she was admitted to the hospital for her "blood being to thin and has been off her coumadin since Thursday  08/23/13 I advised patient keep her follow up appt with her coumadin clinic at [facility] clinic tomorrow for further coumadin instruction  Patient verbalized understanding

## 2013-08-30 ENCOUNTER — Ambulatory Visit: Payer: Self-pay | Admitting: Unknown Physician Specialty

## 2013-08-30 DIAGNOSIS — Z01810 Encounter for preprocedural cardiovascular examination: Secondary | ICD-10-CM

## 2013-09-04 ENCOUNTER — Ambulatory Visit: Payer: Self-pay | Admitting: Unknown Physician Specialty

## 2013-09-04 LAB — PROTIME-INR
INR: 1
PROTHROMBIN TIME: 13.5 s (ref 11.5–14.7)

## 2013-09-25 ENCOUNTER — Encounter: Payer: Self-pay | Admitting: Internal Medicine

## 2013-09-25 ENCOUNTER — Ambulatory Visit (INDEPENDENT_AMBULATORY_CARE_PROVIDER_SITE_OTHER): Payer: Medicare HMO | Admitting: Internal Medicine

## 2013-09-25 VITALS — BP 120/68 | HR 63 | Ht 68.0 in | Wt 129.0 lb

## 2013-09-25 DIAGNOSIS — I4891 Unspecified atrial fibrillation: Secondary | ICD-10-CM

## 2013-09-25 DIAGNOSIS — I498 Other specified cardiac arrhythmias: Secondary | ICD-10-CM

## 2013-09-25 DIAGNOSIS — Z950 Presence of cardiac pacemaker: Secondary | ICD-10-CM

## 2013-09-25 LAB — MDC_IDC_ENUM_SESS_TYPE_INCLINIC
Battery Remaining Longevity: 133 mo
Brady Statistic AP VP Percent: 95 %
Brady Statistic AS VS Percent: 1 %
Lead Channel Impedance Value: 662 Ohm
Lead Channel Pacing Threshold Amplitude: 1 V
Lead Channel Pacing Threshold Pulse Width: 0.4 ms
Lead Channel Sensing Intrinsic Amplitude: 11.2 mV
Lead Channel Setting Pacing Amplitude: 2 V
Lead Channel Setting Pacing Pulse Width: 0.4 ms
Lead Channel Setting Sensing Sensitivity: 5.6 mV
MDC IDC MSMT BATTERY IMPEDANCE: 100 Ohm
MDC IDC MSMT BATTERY VOLTAGE: 2.79 V
MDC IDC MSMT LEADCHNL RA IMPEDANCE VALUE: 689 Ohm
MDC IDC MSMT LEADCHNL RA PACING THRESHOLD AMPLITUDE: 0.5 V
MDC IDC MSMT LEADCHNL RA PACING THRESHOLD PULSEWIDTH: 0.4 ms
MDC IDC MSMT LEADCHNL RA SENSING INTR AMPL: 2 mV
MDC IDC SESS DTM: 20150908121208
MDC IDC SET LEADCHNL RV PACING AMPLITUDE: 2.5 V
MDC IDC STAT BRADY AP VS PERCENT: 1 %
MDC IDC STAT BRADY AS VP PERCENT: 4 %

## 2013-09-25 NOTE — Patient Instructions (Signed)
Your physician wants you to follow-up in: 1 year with Dr. Caryl Comes. You will receive a reminder letter in the mail two months in advance. If you don't receive a letter, please call our office to schedule the follow-up appointment.  Remote monitoring is used to monitor your Pacemaker of ICD from home. This monitoring reduces the number of office visits required to check your device to one time per year. It allows [place] to keep an eye on the functioning of your device to ensure it is working properly. You are scheduled for a device check from home on 12/27/13. You may send your transmission at any time that day. If you have a wireless device, the transmission will be sent automatically. After your physician reviews your transmission, you will receive a postcard with your next transmission date.

## 2013-09-25 NOTE — Progress Notes (Signed)
       Patient Care Team: Kirk Ruths, MD as PCP - General (Unknown Physician Specialty)   HPI  Alejandra Wiley is a 80 y.o. female  seen in followup for palpitations associated with atrial arrhythmias as well as bradycardia requiring backup bradycardia pacing.   She recently underwent sinus surgery which has not been as simple and solving as she would like  Her husband has been moved to the nursing home at [facility] indefinitely   The patient denies chest pain, shortness of breath, nocturnal dyspnea, orthopnea or peripheral edema. There have been no palpitations, lightheadedness or syncope.  Echocardiogram 2103 demonstrated normal left ventricular function  Past Medical History  Diagnosis Date  . Pacemaker 12/06/2003    Medtronic Impulse DTDR01  . Bradycardia   . Arrhythmia     A-Fib  . Sinoatrial node dysfunction   . Sick sinus syndrome     Past Surgical History  Procedure Laterality Date  . Vaginal hysterectomy    . Leep  1999  . Insert / replace / remove pacemaker  12/06/2003    Medtronic impulse DTDR01    Current Outpatient Prescriptions  Medication Sig Dispense Refill  . flecainide (TAMBOCOR) 50 MG tablet Take 150 mg by mouth 2 (two) times daily.      Marland Kitchen GLUCOSAMINE PO Take 2,000 mg by mouth.      . nadolol (CORGARD) 20 MG tablet Take 20 mg by mouth daily.       Marland Kitchen warfarin (COUMADIN) 3 MG tablet Take 3 mg by mouth daily.       No current facility-administered medications for this visit.    Allergies  Allergen Reactions  . Tape     Review of Systems negative except from HPI and PMH  Physical Exam BP 120/68  Pulse 63  Ht 5\' 8"  (1.727 m)  Wt 129 lb (58.514 kg)  BMI 19.62 kg/m2 Well developed and well nourished in no acute distress HENT normal E scleral and icterus clear Neck Supple JVP flat; carotids brisk and full Clear to ausculation  Regular rate and rhythm, no murmurs gallops or rub Soft with active bowel sounds No clubbing  cyanosis  Edema Alert and oriented, grossly normal motor and sensory function Skin Warm and Dry  ECG AV pacing  Assessment and  Plan \ Atrial arrhtyhmia/Fib  Sinus node dysfunction  Pacemaker  Medtronic  The patient's device was interrogated.  The information was reviewed. No changes were made in the programming.    She remains on warfarin   No [employer] symptoms  Significant stress with the placing of her husband

## 2013-10-12 LAB — WOUND CULTURE

## 2013-10-18 LAB — CULTURE, FUNGUS WITHOUT SMEAR

## 2013-11-01 LAB — PATHOLOGY REPORT

## 2013-11-06 ENCOUNTER — Ambulatory Visit (INDEPENDENT_AMBULATORY_CARE_PROVIDER_SITE_OTHER): Payer: Medicare HMO | Admitting: Internal Medicine

## 2013-11-06 ENCOUNTER — Encounter: Payer: Self-pay | Admitting: Internal Medicine

## 2013-11-06 VITALS — BP 181/87 | HR 87 | Temp 98.6°F | Wt 132.5 lb

## 2013-11-06 DIAGNOSIS — B49 Unspecified mycosis: Secondary | ICD-10-CM

## 2013-11-06 DIAGNOSIS — J329 Chronic sinusitis, unspecified: Secondary | ICD-10-CM

## 2013-11-08 DIAGNOSIS — B49 Unspecified mycosis: Secondary | ICD-10-CM | POA: Insufficient documentation

## 2013-11-08 DIAGNOSIS — J329 Chronic sinusitis, unspecified: Principal | ICD-10-CM

## 2013-11-08 NOTE — Progress Notes (Signed)
Patient ID: Alejandra Wiley, female   DOB: 21-Jun-1932, 80 y.o.   MRN: [id number]         [facility]  Reason for Consult: Fungal sinusitis Referring Physician: Dr. Anda Latina  Patient Active Problem List   Diagnosis Date Noted  . 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 ATRIAL FIBRILLATION 06/13/2008  . BRADYCARDIA 06/13/2008  . PACEMAKER-Medtronic 06/13/2008    Patient's Medications  New Prescriptions   No medications on file  Previous Medications   FLECAINIDE (TAMBOCOR) 50 MG TABLET    Take 150 mg by mouth 2 (two) times daily.   GLUCOSAMINE PO    Take 2,000 mg by mouth.   NADOLOL (CORGARD) 20 MG TABLET    Take 20 mg by mouth daily.    WARFARIN (COUMADIN) 3 MG TABLET    Take 3 mg by mouth daily.  Modified Medications   No medications on file  Discontinued Medications   No medications on file    Recommendations: 1. Continue observation off of antibiotics   Assessment: I believe her left maxillary sinus findings are compatible with a mixed bacterial/fungal ball without evidence of tissue invasion. Surgical debridement is the treatment of choice and I do not think that any bacterial or antifungal treatment will offer any benefit at this time.   HPI: Alejandra Wiley is a 80 y.o. female who developed gradual onset of left facial pain in August of 2014. She also noted some intermittent gray nasal drainage on the left side. At that time she was preoccupied by her husband's health problems and did not seek medical care until she saw her dentist in February of this year. Her dentist told her that the pain was not dental in origin. She was seen by her primary care physician Dr. Frazier Richards in June. She received 2 weeks of empiric antibiotic therapy (she believes it was ciprofloxacin) but had no improvement in her symptoms. She saw a neurologist and was given some medicine for possible nerve pain but noticed no improvement in her throbbing  pain. She was then referred to Dr. Tami Ribas who obtained a CT scan which revealed abnormalities in the left maxillary sinus. She had a CT brain scan in August of 2014 when she reported some headache to her cardiologist. It revealed the incidental finding of a large soft tissue mass with some calcification in the left maxillary sinus. She underwent surgery on August 18 of this year. She stated that her pain was 10/10 preoperatively but has decreased to 1-2/10 postoperatively. Operative Gram stain revealed a few white blood cells, moderate gram-positive cocci in clusters and moderate gram variable rods. Operative cultures grew normal flora, Achromobacter dentrificans and mycelia sterilia. There is no evidence of tissue invasion at the time of her recent sinus surgery.  Review of Systems: Constitutional: positive for weight loss and she has had a recent unintentional 10 pound weight loss which she attributes to stress related to caring for her ill husband, negative for anorexia, chills, fevers and sweats Eyes: negative Ears, nose, mouth, throat, and face: as noted in history of present illness Respiratory: negative Cardiovascular: negative Gastrointestinal: negative Genitourinary:negative    Past Medical History  Diagnosis Date  . Pacemaker 12/06/2003    Medtronic Impulse [id number]  . Bradycardia   . Arrhythmia     A-Fib  . Sinoatrial node dysfunction   . Sick sinus syndrome   . Vaginal intraepithelial neoplasia     History  Substance Use Topics  .  Smoking status: Never Smoker   . Smokeless tobacco: Never Used  . Alcohol Use: No     Comment: occasionally    Family History  Problem Relation Age of Onset  . Stroke Mother   . Heart disease Father   . Diabetes Daughter   . Diabetes Son    Allergies  Allergen Reactions  . Latex   . Nickel   . Tape     OBJECTIVE: Blood pressure 181/87, pulse 87, temperature 98.6 F (37 C), temperature source Oral, weight 132 lb 8 oz (60.102  kg). General: She is alert, comfortable and in good spirits Skin: No rash HEENT: Normal external eye exam. Minimal tenderness over left maxillary sinus without any redness or swelling. No oropharyngeal lesions Lungs: Clear Cor: Regular S1 and S2 with no murmur. Normal left anterior chest pacemaker pocket  Microbiology: No results found for this or any previous visit (from the past 240 hour(s)).  Michel Bickers, MD [facility] [phone] pager   [phone] cell 11/08/2013, 1:19 PM

## 2013-12-27 ENCOUNTER — Encounter (HOSPITAL_COMMUNITY): Payer: Self-pay | Admitting: Internal Medicine

## 2013-12-27 ENCOUNTER — Ambulatory Visit (INDEPENDENT_AMBULATORY_CARE_PROVIDER_SITE_OTHER): Payer: Commercial Managed Care - HMO | Admitting: *Deleted

## 2013-12-27 DIAGNOSIS — I4891 Unspecified atrial fibrillation: Secondary | ICD-10-CM

## 2013-12-27 DIAGNOSIS — Z950 Presence of cardiac pacemaker: Secondary | ICD-10-CM

## 2013-12-27 NOTE — Progress Notes (Signed)
Remote pacemaker transmission.   

## 2014-01-02 LAB — MDC_IDC_ENUM_SESS_TYPE_REMOTE
Battery Impedance: 100 Ohm
Battery Voltage: 2.79 V
Brady Statistic AP VS Percent: 2 %
Brady Statistic AS VP Percent: 5 %
Brady Statistic AS VS Percent: 1 %
Lead Channel Impedance Value: 570 Ohm
Lead Channel Pacing Threshold Amplitude: 1 V
Lead Channel Pacing Threshold Amplitude: 1 V
Lead Channel Pacing Threshold Pulse Width: 0.4 ms
Lead Channel Pacing Threshold Pulse Width: 0.4 ms
Lead Channel Setting Pacing Amplitude: 2 V
MDC IDC MSMT BATTERY REMAINING LONGEVITY: 133 mo
MDC IDC MSMT LEADCHNL RV IMPEDANCE VALUE: 719 Ohm
MDC IDC SESS DTM: 20151210165824
MDC IDC SET LEADCHNL RV PACING AMPLITUDE: 2.5 V
MDC IDC SET LEADCHNL RV PACING PULSEWIDTH: 0.4 ms
MDC IDC SET LEADCHNL RV SENSING SENSITIVITY: 4 mV
MDC IDC STAT BRADY AP VP PERCENT: 92 %

## 2014-01-24 ENCOUNTER — Encounter: Payer: Self-pay | Admitting: Cardiology

## 2014-01-29 ENCOUNTER — Encounter: Payer: Self-pay | Admitting: Internal Medicine

## 2014-02-12 DIAGNOSIS — Z7901 Long term (current) use of anticoagulants: Secondary | ICD-10-CM | POA: Diagnosis not present

## 2014-02-21 ENCOUNTER — Other Ambulatory Visit: Payer: Self-pay | Admitting: Internal Medicine

## 2014-03-12 DIAGNOSIS — Z7901 Long term (current) use of anticoagulants: Secondary | ICD-10-CM | POA: Diagnosis not present

## 2014-03-21 DIAGNOSIS — J328 Other chronic sinusitis: Secondary | ICD-10-CM | POA: Diagnosis not present

## 2014-03-21 DIAGNOSIS — J320 Chronic maxillary sinusitis: Secondary | ICD-10-CM | POA: Diagnosis not present

## 2014-03-27 DIAGNOSIS — Z7901 Long term (current) use of anticoagulants: Secondary | ICD-10-CM | POA: Diagnosis not present

## 2014-04-01 ENCOUNTER — Telehealth: Payer: Self-pay | Admitting: Cardiology

## 2014-04-01 ENCOUNTER — Ambulatory Visit (INDEPENDENT_AMBULATORY_CARE_PROVIDER_SITE_OTHER): Payer: Commercial Managed Care - HMO | Admitting: *Deleted

## 2014-04-01 DIAGNOSIS — I4891 Unspecified atrial fibrillation: Secondary | ICD-10-CM | POA: Diagnosis not present

## 2014-04-01 LAB — MDC_IDC_ENUM_SESS_TYPE_REMOTE
Brady Statistic AP VP Percent: 93 %
Brady Statistic AS VP Percent: 4 %
Brady Statistic AS VS Percent: 1 %
Lead Channel Impedance Value: 703 Ohm
Lead Channel Pacing Threshold Pulse Width: 0.4 ms
Lead Channel Setting Pacing Amplitude: 2.5 V
MDC IDC MSMT BATTERY IMPEDANCE: 136 Ohm
MDC IDC MSMT BATTERY REMAINING LONGEVITY: 122 mo
MDC IDC MSMT BATTERY VOLTAGE: 2.79 V
MDC IDC MSMT LEADCHNL RA IMPEDANCE VALUE: 572 Ohm
MDC IDC MSMT LEADCHNL RA PACING THRESHOLD AMPLITUDE: 1 V
MDC IDC MSMT LEADCHNL RV PACING THRESHOLD AMPLITUDE: 0.875 V
MDC IDC MSMT LEADCHNL RV PACING THRESHOLD PULSEWIDTH: 0.4 ms
MDC IDC SESS DTM: 20160314181323
MDC IDC SET LEADCHNL RA PACING AMPLITUDE: 2 V
MDC IDC SET LEADCHNL RV PACING PULSEWIDTH: 0.4 ms
MDC IDC SET LEADCHNL RV SENSING SENSITIVITY: 4 mV
MDC IDC STAT BRADY AP VS PERCENT: 2 %

## 2014-04-01 NOTE — Progress Notes (Signed)
Remote pacemaker transmission.   

## 2014-04-01 NOTE — Telephone Encounter (Signed)
Spoke with pt and reminded pt of remote transmission that is due today. Pt verbalized understanding.   

## 2014-04-03 DIAGNOSIS — D229 Melanocytic nevi, unspecified: Secondary | ICD-10-CM | POA: Diagnosis not present

## 2014-04-03 DIAGNOSIS — D692 Other nonthrombocytopenic purpura: Secondary | ICD-10-CM | POA: Diagnosis not present

## 2014-04-03 DIAGNOSIS — L821 Other seborrheic keratosis: Secondary | ICD-10-CM | POA: Diagnosis not present

## 2014-04-03 DIAGNOSIS — L300 Nummular dermatitis: Secondary | ICD-10-CM | POA: Diagnosis not present

## 2014-04-03 DIAGNOSIS — D1800 Hemangioma unspecified site: Secondary | ICD-10-CM | POA: Diagnosis not present

## 2014-04-03 DIAGNOSIS — L578 Other skin changes due to chronic exposure to nonionizing radiation: Secondary | ICD-10-CM | POA: Diagnosis not present

## 2014-04-03 DIAGNOSIS — Z85828 Personal history of other malignant neoplasm of skin: Secondary | ICD-10-CM | POA: Diagnosis not present

## 2014-04-03 DIAGNOSIS — L814 Other melanin hyperpigmentation: Secondary | ICD-10-CM | POA: Diagnosis not present

## 2014-04-18 DIAGNOSIS — Z7901 Long term (current) use of anticoagulants: Secondary | ICD-10-CM | POA: Diagnosis not present

## 2014-04-23 ENCOUNTER — Encounter: Payer: Self-pay | Admitting: Cardiology

## 2014-05-01 ENCOUNTER — Encounter: Payer: Self-pay | Admitting: Internal Medicine

## 2014-05-01 DIAGNOSIS — Z7901 Long term (current) use of anticoagulants: Secondary | ICD-10-CM | POA: Diagnosis not present

## 2014-05-11 NOTE — Op Note (Signed)
PATIENT NAME:  Alejandra Wiley, Alejandra Wiley MR#:  [id number] DATE OF BIRTH:  10/24/1932  DATE OF PROCEDURE:  09/04/2013  PREOPERATIVE DIAGNOSIS: Chronic left maxillary and ethmoid sinusitis.   POSTOPERATIVE DIAGNOSIS: Chronic left maxillary and ethmoid sinusitis.   PROCEDURES PERFORMED: 1.  Use of Stryker navigation system.  2.  Endoscopic left maxillary antrostomy with removal of tissue.  3.  Endoscopic left anterior and posterior ethmoidectomy with removal of tissue.   SURGEON: Roena Malady, M.D.   OPERATIVE FINDINGS: Polypoid disease in the ethmoids and left maxillary sinus. There was also thick, crusted mucus filling the left maxillary sinus.   DESCRIPTION OF PROCEDURE: Shandra was identified in the holding area, taken to the operating room and placed in the supine position. After general endotracheal anesthesia, the Stryker Navigator was applied and calibrated and remained on throughout the procedure. The patient was then draped in the usual fashion for endoscopic sinus surgery. A Cottonoid pledget with phenylephrine lidocaine was placed within the left nostril. After approximately 8 minutes, this was removed. A 0 degree endoscope was then introduced into the nasal cavity. A local anesthetic of 1% lidocaine with 1:100,000 units epinephrine was used to inject the middle turbinate on the left as well as the left lateral nasal wall. A total of 3 mL was used. The Stryker navigation instrument was then brought into the field and the anatomic landmarks were identified intranasally. The middle turbinate was gently medialized. There was obvious mucopurulent debris coming from the left maxillary sinus. The Cottle elevator was then used to take down the uncinate process and the maxillary sinus was entered. A curved suction with a suction trap was then used to suction out the debris from the left maxillary sinus. This was sent for culture. The straight and side-biting forceps were then used to open the antrostomy  widely giving open access to the maxillary sinus. The curved suction was then used to clean out the sinus in its entirety removing thickened, hard mucoid debris. This was used to carry out and clean out the entire maxillary sinus. The Stryker Navigator was then used to identify the anterior and posterior ethmoid cells on the left. These were opened. Again, there was mucopurulent debris, which was cleaned, as well as polypoid tissue. With the ethmoids open widely and the left maxillary sinus opened widely, the middle turbinate was gently medialized again to get postop access in the clinic. A cottonoid pledget was replaced on the left and left for approximately 8 minutes after which it was removed. With no active bleeding, the sinuses widely patent, Stammberger gel was then used to fill the cavities to prevent bleeding. The patient was then returned to anesthesia where she was awakened in the operating room and taken to the recovery room in stable condition.   CULTURES: Left maxillary sinus.   SPECIMENS: Left maxillary and ethmoid sinus contents.   ESTIMATED BLOOD LOSS: Less than 10 mL.  ____________________________ Roena Malady, MD ctm:sb D: 09/04/2013 08:09:55 ET T: 09/04/2013 08:20:59 ET JOB#: [id number]  cc: Roena Malady, MD, <Dictator> Roena Malady MD ELECTRONICALLY SIGNED 09/21/2013 7:57

## 2014-05-11 NOTE — H&P (Signed)
PATIENT NAME:  Alejandra Wiley, Alejandra Wiley MR#:  [id number] DATE OF BIRTH:  [birth year]  DATE OF ADMISSION:  08/24/2013  PRIMARY CARE PHYSICIAN: Dr. Frazier Richards.   REFERRING PHYSICIAN: Dr. Delman Wiley.   CHIEF COMPLAINT: The patient is sent by the primary care physician to the Emergency Department for elevated INR.   HISTORY OF PRESENT ILLNESS: [name] is a 81 year old female who was recently diagnosed with sinusitis with possible fungal infection planning to undergo surgery in a week who comes to the Emergency Department with elevated PT/INR. The patient states that her pain started on the left side of the cheek about 6 months back. The patient was seen by ENT and neurology who found her to have some fungal infection and planned to undergo treatment. The patient recently underwent treatment with antibiotics for which the patient does not remember. The patient was also thought to have trigeminal neuralgia for which the patient was started on Gabapentin without much improvement. The patient has been having drainage in the back of her throat, spitting that showed to have blood.  Sometimes it is clots. The patient has been having coughing up of the blood for the last one week. Work-up in the Emergency Department, the patient is found to have PT/INR of 9.7. The patient was given vitamin K by the Emergency Department physician of 10 mg IV concerning about hemoptysis. Obtained CT head without contrast which showed no acute intracranial abnormality. Chest x-ray showed no acute abnormalities. The patient does not have any elevated white blood cell count with a hemoglobin of 11.8. Denies having any fever.   PAST MEDICAL HISTORY:  1. Atrial fibrillation.  2. Pacemaker placement.  3. Possible left-sided trigeminal neuralgia.   PAST SURGICAL HISTORY: 1. Vaginal CA.  2. Tonsillectomy.  3. Appendectomy.  4. Partial hysterectomy.  5. Right knee surgery.   ALLERGIES: No known drug allergies.   HOME MEDICATIONS:   1. Coumadin 5 mg 3 times a week.  2. Coumadin 4 mg 4 times a week.  3. Vitamin D3 1000 units once a day.  4. Vitamin C 500 mg daily.  5. Vitamin B12 1,000 mcg once a day.  6. TUMS 1 tablet once a day.  7. Nadolol 20 mg 1 tablet once a day.  8. Multivitamin 1 tablet once a day.  9. Glucosamine 1 tablet 2000 mg  10. Gabapentin daily.  11. Folic acid 0.4 mg daily.  12. Flecainide 35 mg 2 times a day.  13. Advair Diskus 250/50 inhalations once a day.   SOCIAL HISTORY: No history of smoking, drinking alcohol or using illicit drugs. Currently lives in an independent living facility of [place] along with her husband who has been sick lately.    FAMILY HISTORY: Hypertension in the family.   REVIEW OF SYSTEMS:  CONSTITUTIONAL: Has been experiencing some generalized weakness. Has lost significant weight in the last few days most likely secondary to patient being busy with the patient's husband's hospitalizations.  EARS, NOSE AND THROAT: No decreased hearing.  RESPIRATORY: Has mild cough. No shortness of breath.  CARDIOVASCULAR: No chest pain, palpations.  GASTROINTESTINAL: No nausea, vomiting, abdominal pain.  GENITOURINARY: No dysuria or hematuria.  HEMATOLOGIC: No easy bruising or bleeding.  SKIN: No rash or lesions.  MUSCULOSKELETAL: No joint pains and aches.  NEUROLOGIC: No weakness or numbness in any part of the body.   PHYSICAL EXAMINATION:  GENERAL: This which well-built, well-nourished, age-appropriate female lying down in the bed, not in distress.  VITAL SIGNS: Temperature 98,  blood pressure 158/65, respiratory rate of 16, oxygen saturation 97% on room air.  HEENT: Head normocephalic, atraumatic. There is no scleral icterus. Conjunctivae normal. Pupils equal, round and react to light. Extraocular movements are intact. Mucous membranes moist. Has significant pain, tenderness to touch on the left side of the face. No pharyngeal erythema.  NECK: Supple. No lymphadenopathy. No JVD.  No carotid bruit. No thyromegaly.  CHEST: Has no focal tenderness.  LUNGS: Bilaterally clear to auscultation.  HEART: S1 and S2 regular. No murmurs are heard.  ABDOMEN: Bowel sounds present. Soft, nontender, nondistended. No hepatosplenomegaly.  EXTREMITIES: No pedal edema. Pulses 2+.  NEUROLOGIC: The patient is alert, oriented to place, person, and time. Cranial nerves II through XII intact. Motor 5/5 in upper and lower extremities.   LABORATORY DATA: CBC: WBC of 8.1, hemoglobin 11.8, platelet count of 206,000. PT 74, INR of 9.7.   CMP is completely within normal limits.   KUB.   Chest x-ray, one view portable: No acute cardiopulmonary disease.   CT head without contrast.   ASSESSMENT AND PLAN: [name] is a 81 year old female who is sent by the primary care physician for high INR.   1. Supratherapeutic INR with INR close to 10. The patient already received vitamin K 10 mg intravenously. We will continue to follow up with the PT/INR tomorrow.  2. Hemoptysis most likely secondary to supratherapeutic INR and will continue to follow up.  3. Atrial fibrillation. Currently rate is controlled. Continue with the home medications.  4. Possible trigeminal neuralgia on the left side. Gabapentin is not helping her. Patient will benefit being on Tegretol. Will need to follow up with neurology.  5. Keep the patient on sequential compression devices.   TIME SPENT: 50 minutes.    ____________________________ Monica Becton, MD pv:jh D: 08/25/2013 01:36:12 ET T: 08/25/2013 04:17:26 ET JOB#: [id number]  cc: Monica Becton, MD, <Dictator> Ocie Cornfield. Ouida Sills, MD Grier Mitts Deshone Lyssy MD ELECTRONICALLY SIGNED 08/26/2013 20:56

## 2014-05-11 NOTE — Discharge Summary (Signed)
Dates of Admission and Diagnosis:  Date of Admission 24-Aug-2013   Date of Discharge 01-Jan-0001   Admitting Diagnosis Coagulopathy and hemoptysis   Final Diagnosis Coagulopathy and hemoptysis   Discharge Diagnosis 1 Coagulopathy and hemoptysis   2 Chronic atrial fibrillation   3 Trigeminal neuralgia    Chief Complaint/History of Present Illness 81 year old lady referred to the ED yesterday for elevated INR over 10 on routine labs as out patient. During her ED evaluation she c/o hemoptysis and headache. CT head and CXR were negative. Repeat INR in ED was over 9. Patient received vitamin K 10 mg IV.   Allergies:  No Known Allergies:     Routine Coag:  07-Aug-15 20:52   Prothrombin  74.4  INR  9.7 (INR reference interval applies to patients on anticoagulant therapy. A single INR therapeutic range for coumarins is not optimal for all indications; however, the suggested range for most indications is 2.0 - 3.0. Exceptions to the INR Reference Range may include: Prosthetic heart valves, acute myocardial infarction, prevention of myocardial infarction, and combinations of aspirin and anticoagulant. The need for a higher or lower target INR must be assessed individually. Reference: The Pharmacology and Management of the Vitamin K  antagonists: the seventh ACCP Conference on Antithrombotic and Thrombolytic Therapy. GUYQI.3474 Sept:126 (3suppl): N9146842. A HCT value >55% may artifactually increase the PT.  In one study,  the increase was an average of 25%. Reference:  "Effect on Routine and Special Coagulation Testing Values of Citrate Anticoagulant Adjustment in Patients with High HCT Values." American Journal of Clinical Pathology 2006;126:400-405.)  08-Aug-15 03:03   Prothrombin  24.5  INR 2.3 (INR reference interval applies to patients on anticoagulant therapy. A single INR therapeutic range for coumarins is not optimal for all indications; however, the suggested range for  most indications is 2.0 - 3.0. Exceptions to the INR Reference Range may include: Prosthetic heart valves, acute myocardial infarction, prevention of myocardial infarction, and combinations of aspirin and anticoagulant. The need for a higher or lower target INR must be assessed individually. Reference: The Pharmacology and Management of the Vitamin K  antagonists: the seventh ACCP Conference on Antithrombotic and Thrombolytic Therapy. QVZDG.3875 Sept:126 (3suppl): N9146842. A HCT value >55% may artifactually increase the PT.  In one study,  the increase was an average of 25%. Reference:  "Effect on Routine and Special Coagulation Testing Values of Citrate Anticoagulant Adjustment in Patients with High HCT Values." American Journal of Clinical Pathology 2006;126:400-405.)  Routine Hem:  08-Aug-15 03:03   WBC (CBC) 6.7  RBC (CBC)  3.73  Hemoglobin (CBC)  11.5  Hematocrit (CBC)  34.0  Platelet Count (CBC) 183  MCV 91  MCH 30.8  MCHC 33.7  RDW 14.1  Neutrophil % 43.1  Lymphocyte % 40.1  Monocyte % 12.6  Eosinophil % 3.4  Basophil % 0.8  Neutrophil # 2.9  Lymphocyte # 2.7  Monocyte # 0.8  Eosinophil # 0.2  Basophil # 0.1 (Result(s) reported on 25 Aug 2013 at 04:08AM.)   PERTINENT RADIOLOGY STUDIES: XRay:    07-Aug-15 22:08, Chest Portable Single View  Chest Portable Single View   REASON FOR EXAM:    coughing up some blood. INR 10  COMMENTS:       PROCEDURE: DXR - DXR PORTABLE CHEST SINGLE VIEW  - Aug 24 2013 10:08PM     CLINICAL DATA:  Hemoptysis today, weakness, confusion    EXAM:  PORTABLE CHEST - 1 VIEW    COMPARISON:  09/16/2012  FINDINGS:  Heart size upper normal. Vascular pattern normal. Cardiac pacer in  unchanged position. Lungs clear. No effusions.   IMPRESSION:  No active disease.      Electronically Signed    By: Skipper Cliche M.D.    On: 08/24/2013 22:46         Verified By: Rachael Fee, M.D.,  CT:    07-Aug-15 22:25, CT Head  Without Contrast  CT Head Without Contrast   REASON FOR EXAM:    sudden left sided headache, just start 15 min ago.   STAT. Rule out bleed.  COMMENTS:       PROCEDURE: CT  - CT HEAD WITHOUT CONTRAST  - Aug 24 2013 10:25PM     CLINICAL DATA:  Sudden onset of left-sided headache.    EXAM:  CT HEAD WITHOUT CONTRAST    TECHNIQUE:  Contiguous axial images were obtained from the base of the skull  through the vertex without intravenous contrast.  COMPARISON:  CT of the head performed 09/13/2012    FINDINGS:  There is no evidence of acute infarction, mass lesion, or intra- or  extra-axial hemorrhage on CT.    Minimal periventricular white matter change likely reflects small  vessel ischemic microangiopathy. Prominence of the ventricles and  sulci suggests mild cortical volume loss.    The brainstem and fourth ventricle are within normal limits. The  basal ganglia are unremarkable in appearance. The cerebral  hemispheres demonstrate grossly normal gray-white differentiation.  No mass effect or midline shift is seen.  There is no evidence offracture; visualized osseous structures are  unremarkable in appearance. The visualized portions of the orbits  are within normal limits. The paranasal sinuses and mastoid air  cells are well-aerated. No significant soft tissue abnormalities are  seen.     IMPRESSION:  1. No acute intracranial pathology seen on CT.  2. Mild cortical volume loss and scattered small vessel ischemic  microangiopathy.    These results were called by telephone at the time of interpretation  on 08/24/2013 at 11:14 pm to Dr. Delman Kitten, who verbally acknowledged  these results.  Electronically Signed    By: Garald Balding M.D.    On: 08/24/2013 23:16         Verified By: JEFFREY . CHANG, M.D.,   Pertinent Past History:  Pertinent Past History Chronic atrial fibrillation, s/p Pacemaker Trigeminal neuralgia   Hospital Course:  Hospital Course Patient was  monitored overnight. No active bleeding. No further episodes of hemoptysis. Repeat INR was 2.3 today. Patient is eager to go home. Shall resume low dose Coumadin 2 mg daily. She will contact her cardiologist and PMD, Dr. Ouida Sills for repeat INR in 3-4 days and further instructions regarding Coumadin dose.   Condition on Discharge Stable   DISCHARGE INSTRUCTIONS HOME MEDS:  Medication Reconciliation: Patient's Home Medications at Discharge:     Medication Instructions  flecainide 50 mg oral tablet  1.5 tabs (75mg ) orally 2 times a day.   nadolol 20 mg oral tablet  1 tab(s) orally once a day   vitamin c 500 mg oral tablet  1 tab(s) orally once a day   vitamin d3 1000 intl units oral capsule  1 cap(s) orally once a day   folic acid 0.4 mg oral tablet  1 tab(s) orally once a day   multivitamin  1 tab(s) orally once a day   vitamin b12 1000 mcg oral tablet  1 tab(s) orally once a day   gabapentin  orally    warfarin 2 mg oral tablet  1 tab(s) orally once a day   glucosamine  1 tab (2000 milligrams) orally once a day   flovent hfa cfc free 110 mcg/inh inhalation aerosol  2 puff(s) inhaled 2 times a day    PRESCRIPTIONS: ELECTRONICALLY SUBMITTED  STOP TAKING THE FOLLOWING MEDICATION(S):     advair diskus 250 mcg-50 mcg inhalation powder: 1 puff(s) inhaled once a day, As Needed  Physician's Instructions:  Diet Low Sodium   Activity Limitations As tolerated   Return to Work Not Applicable   Time frame for Follow Up Appointment 1-2 weeks  Dr. Ouida Sills   Time frame for Follow Up Appointment 1-2 weeks  [facility]   Other Comments Repeat PT/INR in 3-4 days. Contact Dr. Ouida Sills on Monday for instructions regarding Coumadin dose and PT/INR monitoring     Frazier Richards W(Attending Physician): [facility], [address] [place], [place] [address], [place] [phone]  Electronic Signatures: Glendon Axe (MD)  (Signed 08-Aug-15 13:39)  Authored: ADMISSION DATE AND  DIAGNOSIS, CHIEF COMPLAINT/HPI, Allergies, PERTINENT LABS, PERTINENT RADIOLOGY STUDIES, PERTINENT PAST [place], PATIENT INSTRUCTIONS, Follow Up Physician   Last Updated: 08-Aug-15 13:39 by Glendon Axe (MD)

## 2014-05-16 DIAGNOSIS — Z7901 Long term (current) use of anticoagulants: Secondary | ICD-10-CM | POA: Diagnosis not present

## 2014-06-11 DIAGNOSIS — I480 Paroxysmal atrial fibrillation: Secondary | ICD-10-CM | POA: Diagnosis not present

## 2014-06-11 DIAGNOSIS — I10 Essential (primary) hypertension: Secondary | ICD-10-CM | POA: Diagnosis not present

## 2014-06-11 DIAGNOSIS — E780 Pure hypercholesterolemia: Secondary | ICD-10-CM | POA: Diagnosis not present

## 2014-07-02 ENCOUNTER — Ambulatory Visit (INDEPENDENT_AMBULATORY_CARE_PROVIDER_SITE_OTHER): Payer: Commercial Managed Care - HMO | Admitting: *Deleted

## 2014-07-02 ENCOUNTER — Encounter: Payer: Self-pay | Admitting: Internal Medicine

## 2014-07-02 ENCOUNTER — Telehealth: Payer: Self-pay | Admitting: Cardiology

## 2014-07-02 DIAGNOSIS — I4891 Unspecified atrial fibrillation: Secondary | ICD-10-CM | POA: Diagnosis not present

## 2014-07-02 NOTE — Progress Notes (Signed)
Remote pacemaker transmission.   

## 2014-07-02 NOTE — Telephone Encounter (Signed)
Spoke with pt and reminded pt of remote transmission that is due today. Pt verbalized understanding.   

## 2014-07-04 DIAGNOSIS — R0982 Postnasal drip: Secondary | ICD-10-CM | POA: Diagnosis not present

## 2014-07-04 DIAGNOSIS — J320 Chronic maxillary sinusitis: Secondary | ICD-10-CM | POA: Diagnosis not present

## 2014-07-08 LAB — CUP PACEART REMOTE DEVICE CHECK
Brady Statistic AP VP Percent: 94 %
Brady Statistic AS VS Percent: 1 %
Date Time Interrogation Session: 20160614180121
Lead Channel Pacing Threshold Amplitude: 0.875 V
Lead Channel Pacing Threshold Amplitude: 0.875 V
Lead Channel Pacing Threshold Pulse Width: 0.4 ms
Lead Channel Pacing Threshold Pulse Width: 0.4 ms
MDC IDC MSMT BATTERY IMPEDANCE: 137 Ohm
MDC IDC MSMT BATTERY REMAINING LONGEVITY: 122 mo
MDC IDC MSMT BATTERY VOLTAGE: 2.79 V
MDC IDC MSMT LEADCHNL RA IMPEDANCE VALUE: 630 Ohm
MDC IDC MSMT LEADCHNL RV IMPEDANCE VALUE: 645 Ohm
MDC IDC SET LEADCHNL RA PACING AMPLITUDE: 2 V
MDC IDC SET LEADCHNL RV PACING AMPLITUDE: 2.5 V
MDC IDC SET LEADCHNL RV PACING PULSEWIDTH: 0.4 ms
MDC IDC SET LEADCHNL RV SENSING SENSITIVITY: 4 mV
MDC IDC STAT BRADY AP VS PERCENT: 1 %
MDC IDC STAT BRADY AS VP PERCENT: 4 %

## 2014-07-12 DIAGNOSIS — Z7901 Long term (current) use of anticoagulants: Secondary | ICD-10-CM | POA: Diagnosis not present

## 2014-07-16 ENCOUNTER — Other Ambulatory Visit: Payer: Self-pay

## 2014-07-16 DIAGNOSIS — Z1231 Encounter for screening mammogram for malignant neoplasm of breast: Secondary | ICD-10-CM

## 2014-07-18 DIAGNOSIS — Z7901 Long term (current) use of anticoagulants: Secondary | ICD-10-CM | POA: Diagnosis not present

## 2014-07-24 ENCOUNTER — Encounter: Payer: Self-pay | Admitting: Internal Medicine

## 2014-07-26 ENCOUNTER — Encounter: Payer: Self-pay | Admitting: Cardiology

## 2014-08-08 DIAGNOSIS — I781 Nevus, non-neoplastic: Secondary | ICD-10-CM | POA: Diagnosis not present

## 2014-08-08 DIAGNOSIS — L820 Inflamed seborrheic keratosis: Secondary | ICD-10-CM | POA: Diagnosis not present

## 2014-08-08 DIAGNOSIS — I789 Disease of capillaries, unspecified: Secondary | ICD-10-CM | POA: Diagnosis not present

## 2014-08-08 DIAGNOSIS — I8393 Asymptomatic varicose veins of bilateral lower extremities: Secondary | ICD-10-CM | POA: Diagnosis not present

## 2014-08-15 DIAGNOSIS — Z7901 Long term (current) use of anticoagulants: Secondary | ICD-10-CM | POA: Diagnosis not present

## 2014-09-13 DIAGNOSIS — Z7901 Long term (current) use of anticoagulants: Secondary | ICD-10-CM | POA: Diagnosis not present

## 2014-10-04 DIAGNOSIS — J320 Chronic maxillary sinusitis: Secondary | ICD-10-CM | POA: Diagnosis not present

## 2014-10-08 ENCOUNTER — Ambulatory Visit (INDEPENDENT_AMBULATORY_CARE_PROVIDER_SITE_OTHER): Payer: Commercial Managed Care - HMO | Admitting: Internal Medicine

## 2014-10-08 ENCOUNTER — Encounter: Payer: Self-pay | Admitting: Internal Medicine

## 2014-10-08 VITALS — BP 160/82 | HR 62 | Ht 67.0 in | Wt 131.0 lb

## 2014-10-08 DIAGNOSIS — I4891 Unspecified atrial fibrillation: Secondary | ICD-10-CM

## 2014-10-08 DIAGNOSIS — Z950 Presence of cardiac pacemaker: Secondary | ICD-10-CM | POA: Diagnosis not present

## 2014-10-08 LAB — CUP PACEART INCLINIC DEVICE CHECK
Battery Impedance: 137 Ohm
Battery Remaining Longevity: 122 mo
Battery Voltage: 2.79 V
Brady Statistic AP VP Percent: 93 %
Brady Statistic AP VS Percent: 1 %
Brady Statistic AS VP Percent: 5 %
Brady Statistic AS VS Percent: 1 %
Date Time Interrogation Session: 20160920123650
Lead Channel Impedance Value: 569 Ohm
Lead Channel Impedance Value: 717 Ohm
Lead Channel Pacing Threshold Amplitude: 1 V
Lead Channel Pacing Threshold Amplitude: 1 V
Lead Channel Pacing Threshold Pulse Width: 0.4 ms
Lead Channel Pacing Threshold Pulse Width: 0.4 ms
Lead Channel Sensing Intrinsic Amplitude: 11.2 mV
Lead Channel Setting Pacing Amplitude: 2 V
Lead Channel Setting Pacing Amplitude: 2.5 V
Lead Channel Setting Pacing Pulse Width: 0.4 ms
Lead Channel Setting Sensing Sensitivity: 4 mV

## 2014-10-08 NOTE — Progress Notes (Signed)
       Patient Care Team: Kirk Ruths, MD as PCP - General (Unknown Physician Specialty)   HPI  Alejandra Wiley is a 81 y.o. female  seen in followup for palpitations associated with atrial arrhythmias as well as bradycardia requiring backup bradycardia pacing.   She recently underwent sinus surgery which has not been as simple and solving as she would like  Her husband has been moved to the nursing home at [facility] indefinitely   The patient denies chest pain, shortness of breath, nocturnal dyspnea, orthopnea or peripheral edema. There have been no palpitations, lightheadedness or syncope.  Echocardiogram 2103 demonstrated normal left ventricular function  Past Medical History  Diagnosis Date  . Pacemaker 12/06/2003    Medtronic Impulse DTDR01  . Bradycardia   . Arrhythmia     A-Fib  . Sinoatrial node dysfunction   . Sick sinus syndrome   . Vaginal intraepithelial neoplasia     Past Surgical History  Procedure Laterality Date  . Vaginal hysterectomy    . Leep  1999  . Insert / replace / remove pacemaker  12/06/2003    Medtronic impulse DTDR01  . Total knee arthroplasty    . Joint replacement    . Pacemaker generator change N/A 09/18/2012    Procedure: PACEMAKER GENERATOR CHANGE;  Surgeon: Deboraha Sprang, MD;  Location: [facility] CATH LAB;  Service: Cardiovascular;  Laterality: N/A;    Current Outpatient Prescriptions  Medication Sig Dispense Refill  . flecainide (TAMBOCOR) 50 MG tablet TAKE 1 & 1/2 TABLETS BY MOUTH TWICE DAILY 270 tablet 2  . GLUCOSAMINE PO Take 2,000 mg by mouth.    . Multiple Vitamin (MULTI VITAMIN DAILY PO) Take by mouth daily.    . nadolol (CORGARD) 20 MG tablet Take 20 mg by mouth daily.     Marland Kitchen warfarin (COUMADIN) 4 MG tablet Take 4 mg by mouth daily.     No current facility-administered medications for this visit.    Allergies  Allergen Reactions  . Latex   . Nickel   . Tape     Review of Systems negative except from HPI and  PMH  Physical Exam BP 160/82 mmHg  Pulse 62  Ht 5\' 7"  (1.702 m)  Wt 131 lb (59.421 kg)  BMI 20.51 kg/m2 Well developed and well nourished in no acute distress HENT normal E scleral and icterus clear Neck Supple JVP flat; carotids brisk and full Clear to ausculation  Regular rate and rhythm, no murmurs gallops or rub Soft with active bowel sounds No clubbing cyanosis  Edema Alert and oriented, grossly normal motor and sensory function Skin Warm and Dry  ECG AV pacing  Assessment and  Plan \ Atrial arrhtyhmia/Fib  Sinus node dysfunction  Hypertension  Pacemaker  Medtronic  The patient's device was interrogated.  The information was reviewed. No changes were made in the programming.    She remains on warfarin   No sighincant assoc symptoms  Significant stress with the placing of her husband she is socially brought him home and she cares for him daily.  She is getting out some.  We'll follow her blood pressures an outpatient. She will likely need therapy.

## 2014-10-08 NOTE — Patient Instructions (Signed)
Medication Instructions:  Your physician recommends that you continue on your current medications as directed. Please refer to the Current Medication list given to you today.   Labwork: none  Testing/Procedures: Remote monitoring is used to monitor your Pacemaker of ICD from home. This monitoring reduces the number of office visits required to check your device to one time per year. It allows [place] to keep an eye on the functioning of your device to ensure it is working properly. You are scheduled for a device check from home on December 20. You may send your transmission at any time that day. If you have a wireless device, the transmission will be sent automatically. After your physician reviews your transmission, you will receive a postcard with your next transmission date.    Follow-Up: Your physician wants you to follow-up in: one year with Dr. Caryl Comes.  You will receive a reminder letter in the mail two months in advance. If you don't receive a letter, please call our office to schedule the follow-up appointment.   Any Other Special Instructions Will Be Listed Below (If Applicable).

## 2014-10-21 DIAGNOSIS — Z7901 Long term (current) use of anticoagulants: Secondary | ICD-10-CM | POA: Diagnosis not present

## 2014-11-11 DIAGNOSIS — J320 Chronic maxillary sinusitis: Secondary | ICD-10-CM | POA: Diagnosis not present

## 2014-11-12 ENCOUNTER — Encounter: Payer: Self-pay | Admitting: Internal Medicine

## 2014-11-21 DIAGNOSIS — Z7901 Long term (current) use of anticoagulants: Secondary | ICD-10-CM | POA: Diagnosis not present

## 2014-11-28 ENCOUNTER — Ambulatory Visit
Admission: RE | Admit: 2014-11-28 | Discharge: 2014-11-28 | Disposition: A | Discharge status: Home / Self Care | Payer: Commercial Managed Care - HMO | Source: Ambulatory Visit

## 2014-11-28 DIAGNOSIS — Z1231 Encounter for screening mammogram for malignant neoplasm of breast: Secondary | ICD-10-CM | POA: Diagnosis not present

## 2014-12-05 ENCOUNTER — Telehealth: Payer: Self-pay | Admitting: *Deleted

## 2014-12-05 MED ORDER — FLECAINIDE ACETATE 50 MG PO TABS: ORAL_TABLET | ORAL | Status: DC

## 2014-12-05 NOTE — Telephone Encounter (Signed)
Pt's medication was sent to pt's pharmacy [facility] mail order as requested. I also sent in a 15 day supply [facility] [place] [facility] as well. I advised the pt that if she has any other problems, questions or concerns to call the office. Pt verbalized understanding.

## 2014-12-05 NOTE — Telephone Encounter (Signed)
°*  STAT* If patient is at the pharmacy, call can be transferred to refill team.   1. Which medications need to be refilled? (please list name of each medication and dose if known) Flecainide  2. Which pharmacy/location (including street and city if local pharmacy) is medication to be sent to? [facility]   3. Do they need a 30 day or 90 day supply? 90 day   Only has 5 days left But it takes [facility] 10 days to get meds to her. She needs to know how to bridge this gap.  Please advise.

## 2014-12-06 ENCOUNTER — Other Ambulatory Visit: Payer: Self-pay | Admitting: Internal Medicine

## 2014-12-18 DIAGNOSIS — Z23 Encounter for immunization: Secondary | ICD-10-CM | POA: Diagnosis not present

## 2014-12-20 DIAGNOSIS — Z7901 Long term (current) use of anticoagulants: Secondary | ICD-10-CM | POA: Diagnosis not present

## 2015-01-06 DIAGNOSIS — Z7901 Long term (current) use of anticoagulants: Secondary | ICD-10-CM | POA: Diagnosis not present

## 2015-01-07 ENCOUNTER — Telehealth: Payer: Self-pay | Admitting: Cardiology

## 2015-01-07 ENCOUNTER — Ambulatory Visit (INDEPENDENT_AMBULATORY_CARE_PROVIDER_SITE_OTHER): Payer: Commercial Managed Care - HMO | Admitting: *Deleted

## 2015-01-07 DIAGNOSIS — I4891 Unspecified atrial fibrillation: Secondary | ICD-10-CM | POA: Diagnosis not present

## 2015-01-07 LAB — CUP PACEART REMOTE DEVICE CHECK
Battery Impedance: 161 Ohm
Battery Remaining Longevity: 117 mo
Battery Voltage: 2.79 V
Brady Statistic AS VS Percent: 0 %
Date Time Interrogation Session: 20161220191522
Implantable Lead Implant Date: 20051130
Implantable Lead Location: 753860
Lead Channel Impedance Value: 605 Ohm
Lead Channel Setting Pacing Amplitude: 2 V
Lead Channel Setting Pacing Amplitude: 2.5 V
Lead Channel Setting Pacing Pulse Width: 0.4 ms
Lead Channel Setting Sensing Sensitivity: 4 mV
MDC IDC LEAD IMPLANT DT: 20051130
MDC IDC LEAD LOCATION: 753859
MDC IDC LEAD MODEL: 1652
MDC IDC MSMT LEADCHNL RA IMPEDANCE VALUE: 689 Ohm
MDC IDC MSMT LEADCHNL RA PACING THRESHOLD AMPLITUDE: 0.75 V
MDC IDC MSMT LEADCHNL RA PACING THRESHOLD PULSEWIDTH: 0.4 ms
MDC IDC MSMT LEADCHNL RV PACING THRESHOLD AMPLITUDE: 0.875 V
MDC IDC MSMT LEADCHNL RV PACING THRESHOLD PULSEWIDTH: 0.4 ms
MDC IDC STAT BRADY AP VP PERCENT: 93 %
MDC IDC STAT BRADY AP VS PERCENT: 1 %
MDC IDC STAT BRADY AS VP PERCENT: 6 %

## 2015-01-07 NOTE — Telephone Encounter (Signed)
Spoke with pt and reminded pt of remote transmission that is due today. Pt verbalized understanding.   

## 2015-01-08 NOTE — Progress Notes (Signed)
Remote pacemaker transmission.   

## 2015-01-23 DIAGNOSIS — Z0000 Encounter for general adult medical examination without abnormal findings: Secondary | ICD-10-CM | POA: Diagnosis not present

## 2015-01-23 DIAGNOSIS — E7800 Pure hypercholesterolemia, unspecified: Secondary | ICD-10-CM | POA: Diagnosis not present

## 2015-01-23 DIAGNOSIS — I10 Essential (primary) hypertension: Secondary | ICD-10-CM | POA: Diagnosis not present

## 2015-01-23 DIAGNOSIS — I480 Paroxysmal atrial fibrillation: Secondary | ICD-10-CM | POA: Diagnosis not present

## 2015-01-23 DIAGNOSIS — Z7901 Long term (current) use of anticoagulants: Secondary | ICD-10-CM | POA: Diagnosis not present

## 2015-02-05 DIAGNOSIS — J320 Chronic maxillary sinusitis: Secondary | ICD-10-CM | POA: Diagnosis not present

## 2015-02-07 ENCOUNTER — Encounter: Payer: Self-pay | Admitting: Cardiology

## 2015-02-25 DIAGNOSIS — E7800 Pure hypercholesterolemia, unspecified: Secondary | ICD-10-CM | POA: Diagnosis not present

## 2015-02-25 DIAGNOSIS — I10 Essential (primary) hypertension: Secondary | ICD-10-CM | POA: Diagnosis not present

## 2015-02-25 DIAGNOSIS — Z7901 Long term (current) use of anticoagulants: Secondary | ICD-10-CM | POA: Diagnosis not present

## 2015-03-24 DIAGNOSIS — Z7901 Long term (current) use of anticoagulants: Secondary | ICD-10-CM | POA: Diagnosis not present

## 2015-04-08 ENCOUNTER — Telehealth: Payer: Self-pay | Admitting: Cardiology

## 2015-04-08 ENCOUNTER — Ambulatory Visit (INDEPENDENT_AMBULATORY_CARE_PROVIDER_SITE_OTHER): Payer: Commercial Managed Care - HMO | Admitting: *Deleted

## 2015-04-08 DIAGNOSIS — I4891 Unspecified atrial fibrillation: Secondary | ICD-10-CM | POA: Diagnosis not present

## 2015-04-08 DIAGNOSIS — Z950 Presence of cardiac pacemaker: Secondary | ICD-10-CM

## 2015-04-08 NOTE — Telephone Encounter (Signed)
Spoke with pt and reminded pt of remote transmission that is due today. Pt verbalized understanding.   

## 2015-04-09 NOTE — Progress Notes (Signed)
Remote pacemaker transmission.   

## 2015-04-22 DIAGNOSIS — Z7901 Long term (current) use of anticoagulants: Secondary | ICD-10-CM | POA: Diagnosis not present

## 2015-04-24 DIAGNOSIS — J320 Chronic maxillary sinusitis: Secondary | ICD-10-CM | POA: Diagnosis not present

## 2015-04-24 DIAGNOSIS — R51 Headache: Secondary | ICD-10-CM | POA: Diagnosis not present

## 2015-05-08 LAB — CUP PACEART REMOTE DEVICE CHECK
Battery Impedance: 161 Ohm
Battery Remaining Longevity: 116 mo
Brady Statistic AP VS Percent: 1 %
Brady Statistic AS VS Percent: 0 %
Date Time Interrogation Session: 20170321190545
Implantable Lead Implant Date: 20051130
Implantable Lead Location: 753859
Lead Channel Impedance Value: 619 Ohm
Lead Channel Pacing Threshold Pulse Width: 0.4 ms
Lead Channel Pacing Threshold Pulse Width: 0.4 ms
Lead Channel Setting Pacing Amplitude: 2 V
Lead Channel Setting Pacing Amplitude: 2.5 V
Lead Channel Setting Sensing Sensitivity: 4 mV
MDC IDC LEAD IMPLANT DT: 20051130
MDC IDC LEAD LOCATION: 753860
MDC IDC MSMT BATTERY VOLTAGE: 2.79 V
MDC IDC MSMT LEADCHNL RA IMPEDANCE VALUE: 663 Ohm
MDC IDC MSMT LEADCHNL RA PACING THRESHOLD AMPLITUDE: 1 V
MDC IDC MSMT LEADCHNL RV PACING THRESHOLD AMPLITUDE: 0.875 V
MDC IDC SET LEADCHNL RV PACING PULSEWIDTH: 0.4 ms
MDC IDC STAT BRADY AP VP PERCENT: 92 %
MDC IDC STAT BRADY AS VP PERCENT: 7 %

## 2015-06-04 DIAGNOSIS — Z7901 Long term (current) use of anticoagulants: Secondary | ICD-10-CM | POA: Diagnosis not present

## 2015-06-10 ENCOUNTER — Encounter: Payer: Self-pay | Admitting: Cardiology

## 2015-06-20 DIAGNOSIS — I480 Paroxysmal atrial fibrillation: Secondary | ICD-10-CM | POA: Diagnosis not present

## 2015-06-20 DIAGNOSIS — J40 Bronchitis, not specified as acute or chronic: Secondary | ICD-10-CM | POA: Diagnosis not present

## 2015-06-27 DIAGNOSIS — R05 Cough: Secondary | ICD-10-CM | POA: Diagnosis not present

## 2015-06-27 DIAGNOSIS — I480 Paroxysmal atrial fibrillation: Secondary | ICD-10-CM | POA: Diagnosis not present

## 2015-07-08 ENCOUNTER — Telehealth: Payer: Self-pay | Admitting: Cardiology

## 2015-07-08 ENCOUNTER — Ambulatory Visit (INDEPENDENT_AMBULATORY_CARE_PROVIDER_SITE_OTHER): Payer: Commercial Managed Care - HMO | Admitting: *Deleted

## 2015-07-08 DIAGNOSIS — I4891 Unspecified atrial fibrillation: Secondary | ICD-10-CM

## 2015-07-08 DIAGNOSIS — Z950 Presence of cardiac pacemaker: Secondary | ICD-10-CM

## 2015-07-08 NOTE — Telephone Encounter (Signed)
Spoke with pt and reminded pt of remote transmission that is due today. Pt verbalized understanding.   

## 2015-07-09 LAB — CUP PACEART REMOTE DEVICE CHECK
Battery Impedance: 185 Ohm
Brady Statistic AP VP Percent: 91 %
Brady Statistic AP VS Percent: 1 %
Brady Statistic AS VS Percent: 0 %
Implantable Lead Implant Date: 20051130
Implantable Lead Implant Date: 20051130
Implantable Lead Model: 5076
Lead Channel Impedance Value: 578 Ohm
Lead Channel Pacing Threshold Amplitude: 1.125 V
Lead Channel Pacing Threshold Pulse Width: 0.4 ms
Lead Channel Setting Pacing Amplitude: 2.25 V
Lead Channel Setting Sensing Sensitivity: 4 mV
MDC IDC LEAD LOCATION: 753859
MDC IDC LEAD LOCATION: 753860
MDC IDC MSMT BATTERY REMAINING LONGEVITY: 109 mo
MDC IDC MSMT BATTERY VOLTAGE: 2.79 V
MDC IDC MSMT LEADCHNL RA IMPEDANCE VALUE: 609 Ohm
MDC IDC MSMT LEADCHNL RV PACING THRESHOLD AMPLITUDE: 0.875 V
MDC IDC MSMT LEADCHNL RV PACING THRESHOLD PULSEWIDTH: 0.4 ms
MDC IDC SESS DTM: 20170620184235
MDC IDC SET LEADCHNL RV PACING AMPLITUDE: 2.5 V
MDC IDC SET LEADCHNL RV PACING PULSEWIDTH: 0.4 ms
MDC IDC STAT BRADY AS VP PERCENT: 8 %

## 2015-07-09 NOTE — Progress Notes (Signed)
Remote pacemaker transmission.   

## 2015-07-11 ENCOUNTER — Encounter: Payer: Self-pay | Admitting: Cardiology

## 2015-07-29 DIAGNOSIS — Z7901 Long term (current) use of anticoagulants: Secondary | ICD-10-CM | POA: Diagnosis not present

## 2015-08-27 DIAGNOSIS — Z7901 Long term (current) use of anticoagulants: Secondary | ICD-10-CM | POA: Diagnosis not present

## 2015-08-27 DIAGNOSIS — E7800 Pure hypercholesterolemia, unspecified: Secondary | ICD-10-CM | POA: Diagnosis not present

## 2015-08-27 DIAGNOSIS — I10 Essential (primary) hypertension: Secondary | ICD-10-CM | POA: Diagnosis not present

## 2015-08-27 DIAGNOSIS — I480 Paroxysmal atrial fibrillation: Secondary | ICD-10-CM | POA: Diagnosis not present

## 2015-08-28 DIAGNOSIS — J320 Chronic maxillary sinusitis: Secondary | ICD-10-CM | POA: Diagnosis not present

## 2015-08-28 DIAGNOSIS — R51 Headache: Secondary | ICD-10-CM | POA: Diagnosis not present

## 2015-10-08 DIAGNOSIS — Z7901 Long term (current) use of anticoagulants: Secondary | ICD-10-CM | POA: Diagnosis not present

## 2015-10-14 ENCOUNTER — Encounter: Payer: Self-pay | Admitting: Internal Medicine

## 2015-10-14 ENCOUNTER — Ambulatory Visit (INDEPENDENT_AMBULATORY_CARE_PROVIDER_SITE_OTHER): Payer: Commercial Managed Care - HMO | Admitting: Internal Medicine

## 2015-10-14 VITALS — BP 144/64 | HR 73 | Ht 68.0 in | Wt 129.8 lb

## 2015-10-14 DIAGNOSIS — I4891 Unspecified atrial fibrillation: Secondary | ICD-10-CM | POA: Diagnosis not present

## 2015-10-14 DIAGNOSIS — Z950 Presence of cardiac pacemaker: Secondary | ICD-10-CM | POA: Diagnosis not present

## 2015-10-14 DIAGNOSIS — I10 Essential (primary) hypertension: Secondary | ICD-10-CM

## 2015-10-14 DIAGNOSIS — I442 Atrioventricular block, complete: Secondary | ICD-10-CM | POA: Diagnosis not present

## 2015-10-14 NOTE — Patient Instructions (Signed)

## 2015-10-14 NOTE — Progress Notes (Signed)
       Patient Care Team: Kirk Ruths, MD as PCP - General (Unknown Physician Specialty)   HPI  Alejandra Wiley is a 82 y.o. female  seen in followup for palpitations associated with atrial arrhythmias as well as bradycardia requiring backup bradycardia pacing.   She recently underwent sinus surgery which has not been as simple and solving as she would like  Her husband has been moved to the nursing home at [facility] indefinitely   The patient denies chest pain, shortness of breath, nocturnal dyspnea, orthopnea or peripheral edema. There have been no palpitations, lightheadedness or syncope.   She had some SOB while in the tet[place]  Echocardiogram 2103 demonstrated normal left ventricular function  Past Medical History:  Diagnosis Date  . Arrhythmia    A-Fib  . Bradycardia   . Pacemaker 12/06/2003   Medtronic Impulse DTDR01  . Sick sinus syndrome ([place])   . Sinoatrial node dysfunction (H[facility])   . Vaginal intraepithelial neoplasia     Past Surgical History:  Procedure Laterality Date  . INSERT / REPLACE / REMOVE PACEMAKER  12/06/2003   Medtronic impulse DTDR01  . JOINT REPLACEMENT    . LEEP  1999  . PACEMAKER GENERATOR CHANGE N/A 09/18/2012   Procedure: PACEMAKER GENERATOR CHANGE;  Surgeon: Deboraha Sprang, MD;  Location: [facility] CATH LAB;  Service: Cardiovascular;  Laterality: N/A;  . TOTAL KNEE ARTHROPLASTY    . VAGINAL HYSTERECTOMY      Current Outpatient Prescriptions  Medication Sig Dispense Refill  . flecainide (TAMBOCOR) 50 MG tablet TAKE 1 AND 1/2 TABLETS BY MOUTH TWICE DAILY 270 tablet 2  . GLUCOSAMINE PO Take 2,000 mg by mouth.    . Multiple Vitamin (MULTI VITAMIN DAILY PO) Take by mouth daily.    . nadolol (CORGARD) 20 MG tablet Take 20 mg by mouth daily.     Marland Kitchen warfarin (COUMADIN) 4 MG tablet Take 4 mg by mouth daily.     No current facility-administered medications for this visit.     Allergies  Allergen Reactions  . Latex   . Nickel   . Tape      Review of Systems negative except from HPI and PMH  Physical Exam BP (!) 144/64 (BP Location: Left Arm, Patient Position: Sitting, Cuff Size: Normal)   Pulse 73   Ht 5\' 8"  (1.727 m)   Wt 129 lb 12 oz (58.9 kg)   BMI 19.73 kg/m  Well developed and well nourished in no acute distress HENT normal E scleral and icterus clear Neck Supple JVP flat; carotids brisk and full Clear to ausculation  Regular rate and rhythm, no murmurs gallops or rub Soft with active bowel sounds No clubbing cyanosis  Edema Alert and oriented, grossly normal motor and sensory function Skin Warm and Dry  ECG AV pacing with long AV delay   Assessment and  Plan \ Atrial arrhtyhmia/Fib  Sinus node dysfunction  Hypertension  Pacemaker  Medtronic  The patient's device was interrogated.  The information was reviewed. No changes were made in the programming.    She remains on warfarin   No sighincant assoc symptoms   Trivial AFib  Significant stress with the placing of her husband she is socially brought him home and she cares for him daily.  Dyspnea only at altitude, not seemingly affected by long PR interval so will not adjust  We'll follow her blood pressures an outpatient.

## 2015-10-24 DIAGNOSIS — Z23 Encounter for immunization: Secondary | ICD-10-CM | POA: Diagnosis not present

## 2015-10-31 ENCOUNTER — Encounter: Payer: Self-pay | Admitting: Internal Medicine

## 2015-11-11 DIAGNOSIS — Z7901 Long term (current) use of anticoagulants: Secondary | ICD-10-CM | POA: Diagnosis not present

## 2015-12-16 DIAGNOSIS — Z7901 Long term (current) use of anticoagulants: Secondary | ICD-10-CM | POA: Diagnosis not present

## 2015-12-18 LAB — CUP PACEART INCLINIC DEVICE CHECK
Battery Remaining Longevity: 106 mo
Implantable Lead Implant Date: 20051130
Implantable Pulse Generator Implant Date: 20140901
Lead Channel Pacing Threshold Amplitude: 0.75 V
Lead Channel Pacing Threshold Pulse Width: 0.4 ms
Lead Channel Setting Pacing Amplitude: 2 V
Lead Channel Setting Pacing Amplitude: 2.5 V
Lead Channel Setting Pacing Pulse Width: 0.4 ms
Lead Channel Setting Sensing Sensitivity: 4 mV
MDC IDC LEAD IMPLANT DT: 20051130
MDC IDC LEAD LOCATION: 753859
MDC IDC LEAD LOCATION: 753860
MDC IDC MSMT BATTERY IMPEDANCE: 209 Ohm
MDC IDC MSMT BATTERY VOLTAGE: 2.79 V
MDC IDC MSMT LEADCHNL RA IMPEDANCE VALUE: 580 Ohm
MDC IDC MSMT LEADCHNL RA PACING THRESHOLD AMPLITUDE: 1 V
MDC IDC MSMT LEADCHNL RA PACING THRESHOLD PULSEWIDTH: 0.4 ms
MDC IDC MSMT LEADCHNL RA SENSING INTR AMPL: 1.4 mV
MDC IDC MSMT LEADCHNL RV IMPEDANCE VALUE: 601 Ohm
MDC IDC MSMT LEADCHNL RV SENSING INTR AMPL: 11.2 mV
MDC IDC SESS DTM: 20170926150215
MDC IDC STAT BRADY AP VP PERCENT: 92 %
MDC IDC STAT BRADY AP VS PERCENT: 1 %
MDC IDC STAT BRADY AS VP PERCENT: 7 %
MDC IDC STAT BRADY AS VS PERCENT: 0 %

## 2015-12-24 ENCOUNTER — Other Ambulatory Visit: Payer: Self-pay | Admitting: Internal Medicine

## 2015-12-25 ENCOUNTER — Telehealth: Payer: Self-pay | Admitting: Internal Medicine

## 2015-12-25 MED ORDER — FLECAINIDE ACETATE 50 MG PO TABS: 75.0000 mg | ORAL_TABLET | Freq: Two times a day (BID) | ORAL | 3 refills | Status: DC

## 2015-12-25 NOTE — Telephone Encounter (Signed)
Pt calling stating she only has about 2 days worth of her Flecainide  She would like [place] to send it to her mail order but also [facility] [address] Please advise

## 2015-12-25 NOTE — Telephone Encounter (Signed)
90 day supply sent to mail order [place] and [facility] [address].

## 2016-01-05 DIAGNOSIS — J320 Chronic maxillary sinusitis: Secondary | ICD-10-CM | POA: Diagnosis not present

## 2016-01-05 DIAGNOSIS — R51 Headache: Secondary | ICD-10-CM | POA: Diagnosis not present

## 2016-01-13 ENCOUNTER — Ambulatory Visit (INDEPENDENT_AMBULATORY_CARE_PROVIDER_SITE_OTHER): Payer: Commercial Managed Care - HMO | Admitting: *Deleted

## 2016-01-13 ENCOUNTER — Telehealth: Payer: Self-pay | Admitting: Cardiology

## 2016-01-13 DIAGNOSIS — I442 Atrioventricular block, complete: Secondary | ICD-10-CM

## 2016-01-13 NOTE — Telephone Encounter (Signed)
Spoke with pt and reminded pt of remote transmission that is due today. Pt verbalized understanding.   

## 2016-01-14 NOTE — Progress Notes (Signed)
Remote pacemaker transmission.   

## 2016-01-15 DIAGNOSIS — Z7901 Long term (current) use of anticoagulants: Secondary | ICD-10-CM | POA: Diagnosis not present

## 2016-01-15 LAB — CUP PACEART REMOTE DEVICE CHECK
Battery Impedance: 258 Ohm
Battery Remaining Longevity: 100 mo
Battery Voltage: 2.79 V
Brady Statistic AP VP Percent: 95 %
Brady Statistic AP VS Percent: 1 %
Brady Statistic AS VP Percent: 3 %
Brady Statistic AS VS Percent: 1 %
Date Time Interrogation Session: 20171226191802
Implantable Lead Implant Date: 20051130
Implantable Lead Implant Date: 20051130
Implantable Lead Location: 753859
Implantable Lead Location: 753860
Implantable Lead Model: 5076
Implantable Pulse Generator Implant Date: 20140901
Lead Channel Impedance Value: 588 Ohm
Lead Channel Impedance Value: 609 Ohm
Lead Channel Pacing Threshold Amplitude: 0.875 V
Lead Channel Pacing Threshold Amplitude: 1 V
Lead Channel Pacing Threshold Pulse Width: 0.4 ms
Lead Channel Pacing Threshold Pulse Width: 0.4 ms
Lead Channel Setting Pacing Amplitude: 2 V
Lead Channel Setting Pacing Amplitude: 2.5 V
Lead Channel Setting Pacing Pulse Width: 0.4 ms
Lead Channel Setting Sensing Sensitivity: 4 mV

## 2016-01-16 ENCOUNTER — Encounter: Payer: Self-pay | Admitting: Cardiology

## 2016-01-20 ENCOUNTER — Other Ambulatory Visit: Payer: Self-pay | Admitting: Internal Medicine

## 2016-01-20 DIAGNOSIS — Z1231 Encounter for screening mammogram for malignant neoplasm of breast: Secondary | ICD-10-CM

## 2016-02-17 DIAGNOSIS — Z7901 Long term (current) use of anticoagulants: Secondary | ICD-10-CM | POA: Diagnosis not present

## 2016-02-19 HISTORY — PX: ORIF HIP FRACTURE: SHX2125

## 2016-02-26 ENCOUNTER — Ambulatory Visit: Payer: Commercial Managed Care - HMO

## 2016-03-07 DIAGNOSIS — R262 Difficulty in walking, not elsewhere classified: Secondary | ICD-10-CM | POA: Diagnosis not present

## 2016-03-07 DIAGNOSIS — W109XXA Fall (on) (from) unspecified stairs and steps, initial encounter: Secondary | ICD-10-CM | POA: Diagnosis not present

## 2016-03-07 DIAGNOSIS — I4891 Unspecified atrial fibrillation: Secondary | ICD-10-CM | POA: Diagnosis not present

## 2016-03-07 DIAGNOSIS — M6281 Muscle weakness (generalized): Secondary | ICD-10-CM | POA: Diagnosis not present

## 2016-03-07 DIAGNOSIS — S72001A Fracture of unspecified part of neck of right femur, initial encounter for closed fracture: Secondary | ICD-10-CM | POA: Diagnosis not present

## 2016-03-07 DIAGNOSIS — I495 Sick sinus syndrome: Secondary | ICD-10-CM | POA: Diagnosis not present

## 2016-03-07 DIAGNOSIS — R6889 Other general symptoms and signs: Secondary | ICD-10-CM | POA: Diagnosis not present

## 2016-03-07 DIAGNOSIS — Z743 Need for continuous supervision: Secondary | ICD-10-CM | POA: Diagnosis not present

## 2016-03-07 DIAGNOSIS — R112 Nausea with vomiting, unspecified: Secondary | ICD-10-CM | POA: Diagnosis not present

## 2016-03-07 DIAGNOSIS — I10 Essential (primary) hypertension: Secondary | ICD-10-CM | POA: Diagnosis not present

## 2016-03-07 DIAGNOSIS — R1319 Other dysphagia: Secondary | ICD-10-CM | POA: Diagnosis not present

## 2016-03-07 DIAGNOSIS — M25551 Pain in right hip: Secondary | ICD-10-CM | POA: Diagnosis not present

## 2016-03-07 DIAGNOSIS — E559 Vitamin D deficiency, unspecified: Secondary | ICD-10-CM | POA: Diagnosis not present

## 2016-03-07 DIAGNOSIS — G8911 Acute pain due to trauma: Secondary | ICD-10-CM | POA: Diagnosis not present

## 2016-03-07 DIAGNOSIS — R278 Other lack of coordination: Secondary | ICD-10-CM | POA: Diagnosis not present

## 2016-03-07 DIAGNOSIS — E871 Hypo-osmolality and hyponatremia: Secondary | ICD-10-CM | POA: Diagnosis not present

## 2016-03-07 DIAGNOSIS — I480 Paroxysmal atrial fibrillation: Secondary | ICD-10-CM | POA: Diagnosis not present

## 2016-03-07 DIAGNOSIS — S79811A Other specified injuries of right hip, initial encounter: Secondary | ICD-10-CM | POA: Diagnosis not present

## 2016-03-07 DIAGNOSIS — W19XXXA Unspecified fall, initial encounter: Secondary | ICD-10-CM | POA: Diagnosis not present

## 2016-03-07 DIAGNOSIS — E861 Hypovolemia: Secondary | ICD-10-CM | POA: Diagnosis not present

## 2016-03-07 DIAGNOSIS — S72011D Unspecified intracapsular fracture of right femur, subsequent encounter for closed fracture with routine healing: Secondary | ICD-10-CM | POA: Diagnosis not present

## 2016-03-07 DIAGNOSIS — M545 Low back pain: Secondary | ICD-10-CM | POA: Diagnosis not present

## 2016-03-07 DIAGNOSIS — S7291XA Unspecified fracture of right femur, initial encounter for closed fracture: Secondary | ICD-10-CM | POA: Diagnosis not present

## 2016-03-07 DIAGNOSIS — D62 Acute posthemorrhagic anemia: Secondary | ICD-10-CM | POA: Diagnosis not present

## 2016-03-11 DIAGNOSIS — E639 Nutritional deficiency, unspecified: Secondary | ICD-10-CM | POA: Diagnosis not present

## 2016-03-11 DIAGNOSIS — E871 Hypo-osmolality and hyponatremia: Secondary | ICD-10-CM | POA: Diagnosis not present

## 2016-03-11 DIAGNOSIS — S72019A Unspecified intracapsular fracture of unspecified femur, initial encounter for closed fracture: Secondary | ICD-10-CM | POA: Diagnosis not present

## 2016-03-11 DIAGNOSIS — R1319 Other dysphagia: Secondary | ICD-10-CM | POA: Diagnosis not present

## 2016-03-11 DIAGNOSIS — R262 Difficulty in walking, not elsewhere classified: Secondary | ICD-10-CM | POA: Diagnosis not present

## 2016-03-11 DIAGNOSIS — M6281 Muscle weakness (generalized): Secondary | ICD-10-CM | POA: Diagnosis not present

## 2016-03-11 DIAGNOSIS — S72011A Unspecified intracapsular fracture of right femur, initial encounter for closed fracture: Secondary | ICD-10-CM | POA: Diagnosis not present

## 2016-03-11 DIAGNOSIS — I495 Sick sinus syndrome: Secondary | ICD-10-CM | POA: Diagnosis not present

## 2016-03-11 DIAGNOSIS — S72011D Unspecified intracapsular fracture of right femur, subsequent encounter for closed fracture with routine healing: Secondary | ICD-10-CM | POA: Diagnosis not present

## 2016-03-11 DIAGNOSIS — Z743 Need for continuous supervision: Secondary | ICD-10-CM | POA: Diagnosis not present

## 2016-03-11 DIAGNOSIS — I10 Essential (primary) hypertension: Secondary | ICD-10-CM | POA: Diagnosis not present

## 2016-03-11 DIAGNOSIS — S72001A Fracture of unspecified part of neck of right femur, initial encounter for closed fracture: Secondary | ICD-10-CM | POA: Diagnosis not present

## 2016-03-11 DIAGNOSIS — I480 Paroxysmal atrial fibrillation: Secondary | ICD-10-CM | POA: Diagnosis not present

## 2016-03-11 DIAGNOSIS — D62 Acute posthemorrhagic anemia: Secondary | ICD-10-CM | POA: Diagnosis not present

## 2016-03-11 DIAGNOSIS — I4891 Unspecified atrial fibrillation: Secondary | ICD-10-CM | POA: Diagnosis not present

## 2016-03-11 DIAGNOSIS — S72001D Fracture of unspecified part of neck of right femur, subsequent encounter for closed fracture with routine healing: Secondary | ICD-10-CM | POA: Diagnosis not present

## 2016-03-11 DIAGNOSIS — R278 Other lack of coordination: Secondary | ICD-10-CM | POA: Diagnosis not present

## 2016-03-11 DIAGNOSIS — R112 Nausea with vomiting, unspecified: Secondary | ICD-10-CM | POA: Diagnosis not present

## 2016-03-12 ENCOUNTER — Other Ambulatory Visit: Payer: Self-pay | Admitting: *Deleted

## 2016-03-12 NOTE — Patient Outreach (Signed)
[place] [facility]) Care Management  03/12/2016  Alejandra Wiley Nov 18, 1932 [id number]  Referral via [place] Member discharge; patient discharged from inpatient admission from [facility] 03/11/2016.  Telephone call to patient; no answer x 2; unable to leave message.  Plan; Will follow up.  Sherrin Daisy, RN BSN [place] Management Coordinator [facility] Care Management  [phone]

## 2016-03-14 DIAGNOSIS — S72011A Unspecified intracapsular fracture of right femur, initial encounter for closed fracture: Secondary | ICD-10-CM | POA: Diagnosis not present

## 2016-03-14 DIAGNOSIS — I4891 Unspecified atrial fibrillation: Secondary | ICD-10-CM | POA: Diagnosis not present

## 2016-03-15 ENCOUNTER — Other Ambulatory Visit: Payer: Self-pay | Admitting: *Deleted

## 2016-03-15 NOTE — Patient Outreach (Signed)
[place] [facility]) Care Management  03/15/2016  Alejandra Wiley March 26, 1932 [id number]  Telephone call to patient, non answer & unable to leave voice message. No alternate numbers noted.  Plan: Will follow up.  Sherrin Daisy, RN BSN CCM Care Management Coordinator [facility] Care Management  [phone]  .

## 2016-03-17 ENCOUNTER — Other Ambulatory Visit: Payer: Self-pay | Admitting: *Deleted

## 2016-03-17 ENCOUNTER — Encounter: Payer: Self-pay | Admitting: *Deleted

## 2016-03-17 NOTE — Patient Outreach (Signed)
[name] [facility]) Care Management  03/17/2016  Alejandra Wiley 1932-02-16 [id number]  Telephone call attempt x 3-no answer; attempted to leave message. Telephone call to primary care office to verify phone number.  Spoke with Marlowe Kays who advised that there was no alternate phone number listed for patient.  States patient had recent office encounter for blood work. Last office visit to see MD noted as Aug 2017.  Left message to have referral nurse call this RN care coordinator referral contacting patient.  Plan: Geophysicist/field seismologist. Follow up 10 business days.  Sherrin Daisy, RN BSN [place] Management Coordinator [facility] Care Management  [phone]

## 2016-03-18 DIAGNOSIS — S72001D Fracture of unspecified part of neck of right femur, subsequent encounter for closed fracture with routine healing: Secondary | ICD-10-CM | POA: Diagnosis not present

## 2016-03-18 DIAGNOSIS — I4891 Unspecified atrial fibrillation: Secondary | ICD-10-CM | POA: Diagnosis not present

## 2016-03-19 DIAGNOSIS — S72001A Fracture of unspecified part of neck of right femur, initial encounter for closed fracture: Secondary | ICD-10-CM | POA: Diagnosis not present

## 2016-03-24 DIAGNOSIS — S72011A Unspecified intracapsular fracture of right femur, initial encounter for closed fracture: Secondary | ICD-10-CM | POA: Diagnosis not present

## 2016-03-29 ENCOUNTER — Ambulatory Visit: Payer: Commercial Managed Care - HMO

## 2016-04-02 DIAGNOSIS — M8000XD Age-related osteoporosis with current pathological fracture, unspecified site, subsequent encounter for fracture with routine healing: Secondary | ICD-10-CM | POA: Diagnosis not present

## 2016-04-02 DIAGNOSIS — I482 Chronic atrial fibrillation: Secondary | ICD-10-CM | POA: Diagnosis not present

## 2016-04-02 DIAGNOSIS — Z471 Aftercare following joint replacement surgery: Secondary | ICD-10-CM | POA: Diagnosis not present

## 2016-04-02 DIAGNOSIS — M25561 Pain in right knee: Secondary | ICD-10-CM | POA: Diagnosis not present

## 2016-04-02 DIAGNOSIS — Z96651 Presence of right artificial knee joint: Secondary | ICD-10-CM | POA: Diagnosis not present

## 2016-04-13 ENCOUNTER — Ambulatory Visit (INDEPENDENT_AMBULATORY_CARE_PROVIDER_SITE_OTHER): Payer: Medicare HMO | Admitting: *Deleted

## 2016-04-13 ENCOUNTER — Telehealth: Payer: Self-pay | Admitting: Cardiology

## 2016-04-13 DIAGNOSIS — I442 Atrioventricular block, complete: Secondary | ICD-10-CM

## 2016-04-13 LAB — CUP PACEART REMOTE DEVICE CHECK
Battery Impedance: 233 Ohm
Battery Voltage: 2.79 V
Brady Statistic AP VP Percent: 80 %
Brady Statistic AS VP Percent: 11 %
Brady Statistic AS VS Percent: 5 %
Date Time Interrogation Session: 20180328155952
Implantable Lead Implant Date: 20051130
Implantable Lead Location: 753860
Implantable Lead Model: 5076
Implantable Pulse Generator Implant Date: 20140901
Lead Channel Impedance Value: 630 Ohm
Lead Channel Pacing Threshold Amplitude: 0.875 V
Lead Channel Pacing Threshold Amplitude: 1.125 V
Lead Channel Setting Pacing Amplitude: 2.5 V
Lead Channel Setting Pacing Pulse Width: 0.4 ms
MDC IDC LEAD IMPLANT DT: 20051130
MDC IDC LEAD LOCATION: 753859
MDC IDC MSMT BATTERY REMAINING LONGEVITY: 107 mo
MDC IDC MSMT LEADCHNL RA PACING THRESHOLD PULSEWIDTH: 0.4 ms
MDC IDC MSMT LEADCHNL RV IMPEDANCE VALUE: 695 Ohm
MDC IDC MSMT LEADCHNL RV PACING THRESHOLD PULSEWIDTH: 0.4 ms
MDC IDC SET LEADCHNL RA PACING AMPLITUDE: 2 V
MDC IDC SET LEADCHNL RV SENSING SENSITIVITY: 5.6 mV
MDC IDC STAT BRADY AP VS PERCENT: 3 %

## 2016-04-13 NOTE — Telephone Encounter (Signed)
Spoke with pt and reminded pt of remote transmission that is due today. Pt verbalized understanding.   

## 2016-04-16 ENCOUNTER — Encounter: Payer: Self-pay | Admitting: Cardiology

## 2016-04-16 NOTE — Progress Notes (Signed)
Remote pacemaker transmission.   

## 2016-04-19 DIAGNOSIS — Z96649 Presence of unspecified artificial hip joint: Secondary | ICD-10-CM | POA: Diagnosis not present

## 2016-04-19 DIAGNOSIS — M1611 Unilateral primary osteoarthritis, right hip: Secondary | ICD-10-CM | POA: Diagnosis not present

## 2016-05-03 DIAGNOSIS — M25551 Pain in right hip: Secondary | ICD-10-CM | POA: Diagnosis not present

## 2016-05-05 DIAGNOSIS — M25551 Pain in right hip: Secondary | ICD-10-CM | POA: Diagnosis not present

## 2016-05-10 DIAGNOSIS — M25551 Pain in right hip: Secondary | ICD-10-CM | POA: Diagnosis not present

## 2016-05-12 DIAGNOSIS — M25551 Pain in right hip: Secondary | ICD-10-CM | POA: Diagnosis not present

## 2016-05-17 DIAGNOSIS — M25551 Pain in right hip: Secondary | ICD-10-CM | POA: Diagnosis not present

## 2016-05-19 DIAGNOSIS — Z7901 Long term (current) use of anticoagulants: Secondary | ICD-10-CM | POA: Diagnosis not present

## 2016-05-20 DIAGNOSIS — M25551 Pain in right hip: Secondary | ICD-10-CM | POA: Diagnosis not present

## 2016-05-24 DIAGNOSIS — M25551 Pain in right hip: Secondary | ICD-10-CM | POA: Diagnosis not present

## 2016-05-25 DIAGNOSIS — J320 Chronic maxillary sinusitis: Secondary | ICD-10-CM | POA: Diagnosis not present

## 2016-05-25 DIAGNOSIS — K1121 Acute sialoadenitis: Secondary | ICD-10-CM | POA: Diagnosis not present

## 2016-05-26 DIAGNOSIS — M25551 Pain in right hip: Secondary | ICD-10-CM | POA: Diagnosis not present

## 2016-06-04 DIAGNOSIS — M25551 Pain in right hip: Secondary | ICD-10-CM | POA: Diagnosis not present

## 2016-06-08 DIAGNOSIS — M25551 Pain in right hip: Secondary | ICD-10-CM | POA: Diagnosis not present

## 2016-06-14 DIAGNOSIS — M25551 Pain in right hip: Secondary | ICD-10-CM | POA: Diagnosis not present

## 2016-06-18 DIAGNOSIS — M25551 Pain in right hip: Secondary | ICD-10-CM | POA: Diagnosis not present

## 2016-06-22 DIAGNOSIS — Z7901 Long term (current) use of anticoagulants: Secondary | ICD-10-CM | POA: Diagnosis not present

## 2016-06-22 DIAGNOSIS — M25551 Pain in right hip: Secondary | ICD-10-CM | POA: Diagnosis not present

## 2016-06-25 DIAGNOSIS — M25551 Pain in right hip: Secondary | ICD-10-CM | POA: Diagnosis not present

## 2016-06-29 DIAGNOSIS — M25551 Pain in right hip: Secondary | ICD-10-CM | POA: Diagnosis not present

## 2016-07-02 ENCOUNTER — Other Ambulatory Visit: Payer: Self-pay | Admitting: *Deleted

## 2016-07-02 DIAGNOSIS — M25551 Pain in right hip: Secondary | ICD-10-CM | POA: Diagnosis not present

## 2016-07-02 NOTE — Patient Outreach (Signed)
[place] [facility]) Care Management  07/02/2016  ZENAIDA TESAR [birth year] [id number]  Unsuccessful contact attempts/outreach letter.  Plan: Send to care management assistant to close case.  Sherrin Daisy, RN BSN [place] Management Coordinator [facility] Care Management  [phone]

## 2016-07-06 DIAGNOSIS — M25551 Pain in right hip: Secondary | ICD-10-CM | POA: Diagnosis not present

## 2016-07-09 DIAGNOSIS — M25551 Pain in right hip: Secondary | ICD-10-CM | POA: Diagnosis not present

## 2016-07-13 DIAGNOSIS — M25551 Pain in right hip: Secondary | ICD-10-CM | POA: Diagnosis not present

## 2016-07-15 ENCOUNTER — Ambulatory Visit (INDEPENDENT_AMBULATORY_CARE_PROVIDER_SITE_OTHER): Payer: Medicare HMO | Admitting: *Deleted

## 2016-07-15 ENCOUNTER — Telehealth: Payer: Self-pay | Admitting: Cardiology

## 2016-07-15 DIAGNOSIS — I442 Atrioventricular block, complete: Secondary | ICD-10-CM

## 2016-07-15 NOTE — Telephone Encounter (Signed)
Spoke with pt and reminded pt of remote transmission that is due today. Pt verbalized understanding.   

## 2016-07-16 ENCOUNTER — Encounter: Payer: Self-pay | Admitting: Cardiology

## 2016-07-16 NOTE — Progress Notes (Signed)
Remote pacemaker transmission.   

## 2016-07-20 DIAGNOSIS — M25551 Pain in right hip: Secondary | ICD-10-CM | POA: Diagnosis not present

## 2016-07-23 DIAGNOSIS — Z7901 Long term (current) use of anticoagulants: Secondary | ICD-10-CM | POA: Diagnosis not present

## 2016-08-11 LAB — CUP PACEART REMOTE DEVICE CHECK
Battery Impedance: 307 Ohm
Brady Statistic AP VS Percent: 3 %
Brady Statistic AS VP Percent: 12 %
Brady Statistic AS VS Percent: 8 %
Implantable Lead Implant Date: 20051130
Implantable Lead Location: 753859
Implantable Lead Model: 5076
Implantable Pulse Generator Implant Date: 20140901
Lead Channel Impedance Value: 610 Ohm
Lead Channel Impedance Value: 698 Ohm
Lead Channel Pacing Threshold Pulse Width: 0.4 ms
Lead Channel Pacing Threshold Pulse Width: 0.4 ms
Lead Channel Setting Pacing Amplitude: 2.5 V
Lead Channel Setting Sensing Sensitivity: 4 mV
MDC IDC LEAD IMPLANT DT: 20051130
MDC IDC LEAD LOCATION: 753860
MDC IDC MSMT BATTERY REMAINING LONGEVITY: 99 mo
MDC IDC MSMT BATTERY VOLTAGE: 2.79 V
MDC IDC MSMT LEADCHNL RA PACING THRESHOLD AMPLITUDE: 0.75 V
MDC IDC MSMT LEADCHNL RV PACING THRESHOLD AMPLITUDE: 1.125 V
MDC IDC SESS DTM: 20180629112026
MDC IDC SET LEADCHNL RA PACING AMPLITUDE: 2 V
MDC IDC SET LEADCHNL RV PACING PULSEWIDTH: 0.4 ms
MDC IDC STAT BRADY AP VP PERCENT: 76 %

## 2016-08-12 DIAGNOSIS — Z808 Family history of malignant neoplasm of other organs or systems: Secondary | ICD-10-CM | POA: Diagnosis not present

## 2016-08-12 DIAGNOSIS — Z85828 Personal history of other malignant neoplasm of skin: Secondary | ICD-10-CM | POA: Diagnosis not present

## 2016-08-12 DIAGNOSIS — I788 Other diseases of capillaries: Secondary | ICD-10-CM | POA: Diagnosis not present

## 2016-08-12 DIAGNOSIS — S0086XA Insect bite (nonvenomous) of other part of head, initial encounter: Secondary | ICD-10-CM | POA: Diagnosis not present

## 2016-08-20 DIAGNOSIS — M810 Age-related osteoporosis without current pathological fracture: Secondary | ICD-10-CM | POA: Diagnosis not present

## 2016-08-26 DIAGNOSIS — I10 Essential (primary) hypertension: Secondary | ICD-10-CM | POA: Diagnosis not present

## 2016-08-26 DIAGNOSIS — E786 Lipoprotein deficiency: Secondary | ICD-10-CM | POA: Diagnosis not present

## 2016-08-26 DIAGNOSIS — Z7901 Long term (current) use of anticoagulants: Secondary | ICD-10-CM | POA: Diagnosis not present

## 2016-09-01 DIAGNOSIS — T8484XA Pain due to internal orthopedic prosthetic devices, implants and grafts, initial encounter: Secondary | ICD-10-CM | POA: Diagnosis not present

## 2016-09-01 DIAGNOSIS — M1712 Unilateral primary osteoarthritis, left knee: Secondary | ICD-10-CM | POA: Diagnosis not present

## 2016-09-01 DIAGNOSIS — Z96651 Presence of right artificial knee joint: Secondary | ICD-10-CM | POA: Diagnosis not present

## 2016-09-02 DIAGNOSIS — Z0000 Encounter for general adult medical examination without abnormal findings: Secondary | ICD-10-CM | POA: Diagnosis not present

## 2016-09-02 DIAGNOSIS — I10 Essential (primary) hypertension: Secondary | ICD-10-CM | POA: Diagnosis not present

## 2016-09-02 DIAGNOSIS — M810 Age-related osteoporosis without current pathological fracture: Secondary | ICD-10-CM | POA: Diagnosis not present

## 2016-09-02 DIAGNOSIS — I480 Paroxysmal atrial fibrillation: Secondary | ICD-10-CM | POA: Diagnosis not present

## 2016-09-03 ENCOUNTER — Other Ambulatory Visit: Payer: Self-pay | Admitting: Orthopedic Surgery

## 2016-09-03 DIAGNOSIS — T8484XA Pain due to internal orthopedic prosthetic devices, implants and grafts, initial encounter: Secondary | ICD-10-CM

## 2016-09-03 DIAGNOSIS — Z96651 Presence of right artificial knee joint: Secondary | ICD-10-CM

## 2016-09-07 ENCOUNTER — Ambulatory Visit
Admission: RE | Admit: 2016-09-07 | Discharge: 2016-09-07 | Disposition: A | Discharge status: Home / Self Care | Payer: Medicare HMO | Source: Ambulatory Visit | Attending: Orthopedic Surgery | Admitting: Orthopedic Surgery

## 2016-09-07 DIAGNOSIS — Z96651 Presence of right artificial knee joint: Secondary | ICD-10-CM

## 2016-09-07 DIAGNOSIS — X58XXXA Exposure to other specified factors, initial encounter: Secondary | ICD-10-CM | POA: Diagnosis not present

## 2016-09-07 DIAGNOSIS — M7989 Other specified soft tissue disorders: Secondary | ICD-10-CM | POA: Diagnosis not present

## 2016-09-07 DIAGNOSIS — M25561 Pain in right knee: Secondary | ICD-10-CM | POA: Diagnosis not present

## 2016-09-07 DIAGNOSIS — T8484XA Pain due to internal orthopedic prosthetic devices, implants and grafts, initial encounter: Secondary | ICD-10-CM | POA: Insufficient documentation

## 2016-09-07 DIAGNOSIS — M25461 Effusion, right knee: Secondary | ICD-10-CM | POA: Diagnosis not present

## 2016-09-17 ENCOUNTER — Encounter
Admission: RE | Admit: 2016-09-17 | Discharge: 2016-09-17 | Disposition: A | Discharge status: Home / Self Care | Payer: Medicare HMO | Source: Ambulatory Visit | Attending: Orthopedic Surgery | Admitting: Orthopedic Surgery

## 2016-09-17 ENCOUNTER — Inpatient Hospital Stay: Admission: RE | Admit: 2016-09-17 | Payer: Medicare HMO | Source: Ambulatory Visit

## 2016-09-17 DIAGNOSIS — Z01812 Encounter for preprocedural laboratory examination: Secondary | ICD-10-CM | POA: Insufficient documentation

## 2016-09-17 DIAGNOSIS — I447 Left bundle-branch block, unspecified: Secondary | ICD-10-CM | POA: Diagnosis not present

## 2016-09-17 DIAGNOSIS — Z01810 Encounter for preprocedural cardiovascular examination: Secondary | ICD-10-CM | POA: Diagnosis not present

## 2016-09-17 DIAGNOSIS — Z22322 Carrier or suspected carrier of Methicillin resistant Staphylococcus aureus: Secondary | ICD-10-CM

## 2016-09-17 HISTORY — DX: Carrier or suspected carrier of methicillin resistant Staphylococcus aureus: Z22.322

## 2016-09-17 HISTORY — DX: Malignant (primary) neoplasm, unspecified: C80.1

## 2016-09-17 LAB — URINALYSIS, ROUTINE W REFLEX MICROSCOPIC
BACTERIA UA: NONE SEEN
Bilirubin Urine: NEGATIVE
Glucose, UA: NEGATIVE mg/dL
Ketones, ur: 5 mg/dL — AB
Leukocytes, UA: NEGATIVE
NITRITE: NEGATIVE
PH: 7 (ref 5.0–8.0)
Protein, ur: NEGATIVE mg/dL
SPECIFIC GRAVITY, URINE: 1.011 (ref 1.005–1.030)

## 2016-09-17 LAB — BASIC METABOLIC PANEL
Anion gap: 10 (ref 5–15)
BUN: 15 mg/dL (ref 6–20)
CALCIUM: 9.4 mg/dL (ref 8.9–10.3)
CHLORIDE: 97 mmol/L — AB (ref 101–111)
CO2: 28 mmol/L (ref 22–32)
CREATININE: 0.67 mg/dL (ref 0.44–1.00)
GFR calc non Af Amer: >60 mL/min (ref >60)
Glucose, Bld: 103 mg/dL — ABNORMAL HIGH (ref 65–99)
Potassium: 4 mmol/L (ref 3.5–5.1)
SODIUM: 135 mmol/L (ref 135–145)

## 2016-09-17 LAB — TYPE AND SCREEN
ABO/RH(D): O POS
ANTIBODY SCREEN: NEGATIVE

## 2016-09-17 LAB — CBC
HCT: 36.3 % (ref 35.0–47.0)
HEMOGLOBIN: 12.2 g/dL (ref 12.0–16.0)
MCH: 30.4 pg (ref 26.0–34.0)
MCHC: 33.8 g/dL (ref 32.0–36.0)
MCV: 90.1 fL (ref 80.0–100.0)
PLATELETS: 187 10*3/uL (ref 150–440)
RBC: 4.02 MIL/uL (ref 3.80–5.20)
RDW: 14.9 % — ABNORMAL HIGH (ref 11.5–14.5)
WBC: 7.5 10*3/uL (ref 3.6–11.0)

## 2016-09-17 LAB — PROTIME-INR
INR: 2.31
PROTHROMBIN TIME: 25.2 s — AB (ref 11.4–15.2)

## 2016-09-17 LAB — APTT: APTT: 45 s — AB (ref 24–36)

## 2016-09-17 LAB — SURGICAL PCR SCREEN
MRSA, PCR: POSITIVE — AB
Staphylococcus aureus: POSITIVE — AB

## 2016-09-17 NOTE — Pre-Procedure Instructions (Signed)
Pacemaker form and EKG with changes sent to Dr. Olin Pia to evaluate.

## 2016-09-17 NOTE — Pre-Procedure Instructions (Signed)
U/a results and MRSA positive results to Dr. Rudene Christians per fax

## 2016-09-17 NOTE — Pre-Procedure Instructions (Signed)
Surgery cancelled for Tues. 09/21/16 until Dr. Caryl Comes reviews EKG changes.[name] notified to continue her coumadin tonight and tomorrow and stop it on Sunday for anticipated surgery on Thurs. 09/23/16. Verbalizes understanding.

## 2016-09-17 NOTE — Patient Instructions (Signed)
  Your procedure is scheduled on: Tues. 09/21/16 Report to Day Surgery at 6:00.  Remember: Instructions that are not followed completely may result in serious medical risk, up to and including death, or upon the discretion of your surgeon and anesthesiologist your surgery may need to be rescheduled.    __x__ 1. Do not eat food after midnight the night before your procedure. No gum chewing or hard candies. You may drink clear liquids up to 2 hours before you are scheduled to arrive for your surgery- DO not drink clear liquids within 2 hours of the start of your surgery.  Clear Liquids include: water, apple juice without pulp, clear carbohydrate drink such as Clearfast of Gartorade, Black Coffee or Tea (Do not add anything to coffee or tea).    ___x_ 2. No Alcohol for 24 hours before or after surgery.   ____ 3. Do Not Smoke For 24 Hours Prior to Your Surgery.   ____ 4. Bring all medications with you on the day of surgery if instructed.    __x__ 5. Notify your doctor if there is any change in your medical condition     (cold, fever, infections).       Do not wear jewelry, make-up, hairpins, clips or nail polish.  Do not wear lotions, powders, or perfumes. You may wear deodorant.  Do not shave 48 hours prior to surgery. Men may shave face and neck.  Do not bring valuables to the hospital.    [facility] is not responsible for any belongings or valuables.               Contacts, dentures or bridgework may not be worn into surgery.  Leave your suitcase in the car. After surgery it may be brought to your room.  For patients admitted to the hospital, discharge time is determined by your                treatment team.   Patients discharged the day of surgery will not be allowed to drive home.   Please read over the following fact sheets that you were given:      __x__ Take these medicines the morning of surgery with A SIP OF WATER:    1. flecainide (TAMBOCOR) 50 MG  tablet  2. nadolol (CORGARD) 20 MG tablet  3.   4.  5.  6.  ____ Fleet Enema (as directed)   __x__ Use CHG Soap as directed  ____ Use inhalers on the day of surgery  ____ Stop metformin 2 days prior to surgery    ____ Take 1/2 of usual insulin dose the night before surgery and none on the morning of surgery.   __x__ Stop Coumadin today  ____ Stop Anti-inflammatories on    _x___ Stop supplements until after surgery.  Glucosamine HCl 1500 MG TABS today  ____ Bring C-Pap to the hospital.

## 2016-09-19 LAB — URINE CULTURE
CULTURE: NO GROWTH
SPECIAL REQUESTS: NORMAL

## 2016-09-22 ENCOUNTER — Other Ambulatory Visit: Payer: Self-pay

## 2016-09-22 ENCOUNTER — Telehealth: Payer: Self-pay | Admitting: Internal Medicine

## 2016-09-22 ENCOUNTER — Ambulatory Visit: Payer: Medicare HMO

## 2016-09-22 VITALS — Resp 16 | Ht 66.0 in | Wt 124.1 lb

## 2016-09-22 DIAGNOSIS — Z01818 Encounter for other preprocedural examination: Secondary | ICD-10-CM

## 2016-09-22 DIAGNOSIS — I482 Chronic atrial fibrillation, unspecified: Secondary | ICD-10-CM

## 2016-09-22 MED ORDER — CEFAZOLIN SODIUM 1 G IJ SOLR
2000.0000 mg | Freq: Once | INTRAMUSCULAR | Status: AC
  Administered 2016-09-23: 2000 mg via INTRAVENOUS
  Filled 2016-09-22: qty 20

## 2016-09-22 MED ORDER — VANCOMYCIN HCL 500 MG IV SOLR
500.0000 mg | Freq: Once | INTRAVENOUS | Status: AC
  Administered 2016-09-23: 500 mg via INTRAVENOUS
  Filled 2016-09-22: qty 500

## 2016-09-22 NOTE — Telephone Encounter (Signed)
Alejandra Wiley from [facility] ortho calling asking about surgery clearance form they sent on Friday Pt is right knee revision surgery done tomorrow  Would like to know if we can please fill out the form and send back as soon as we can  Please advise

## 2016-09-22 NOTE — Pre-Procedure Instructions (Signed)
CASEY AT [facility] HANDLING CLEARANCE WITH DR [place]

## 2016-09-22 NOTE — Telephone Encounter (Signed)
Spoke with Dr Caryl Comes. He needs patient to come in for an EKG check to verify pacing and rhythm prior to completing clearance. Patient notified and can come in at 2:30pm today.  Kacie at [facility] called as well. She was informed of process and what is going on. She will need something in writing with clearance faxed to [phone].

## 2016-09-22 NOTE — Telephone Encounter (Signed)
Patient came in for nurse visit for EKG check. Dr Caryl Comes reviewed EKG. Dr. Caryl Comes at [address] today. Dr Caryl Comes completed clearance paperwork and form faxed to [facility] at [facility].  Received incoming call from [place]. She said she did receive the clearance fax. I confirmed that patient was cleared for surgery.

## 2016-09-22 NOTE — Pre-Procedure Instructions (Signed)
REQUEST FOR CLEARANCE / EKG/ PACEMAKER PERIOP SHEET TAKEN TO [facility] OFFICE AND JENNIFER MADE COPIES AND WILL FAX AGAIN TO [place] OFFICE

## 2016-09-22 NOTE — Pre-Procedure Instructions (Signed)
CALL FROM CACIE AT [facility] PATIENT CNL FOR 09/23/16.

## 2016-09-22 NOTE — Telephone Encounter (Signed)
Follow up     Called again to get the surgical clearance please fax it asap to [phone]

## 2016-09-22 NOTE — Pre-Procedure Instructions (Signed)
09/17/16 Dr. Rudene Christians office notified per fax of Positive MRSA PCR, Urine results and EKG changes.  Surgery postponed until clearance received.  Patient notified to continue her coumadin until Sunday 09/19/16 due to surgery being postponed.

## 2016-09-22 NOTE — Telephone Encounter (Signed)
S/w Alejandra Wiley. Let her know I could not locate clearance form at this time and that Dr Caryl Comes nor his nurse are in the office today. Dr. Caryl Comes will be at [facility] street in clinic this afternoon. Advised I will try to reach him there to fill out the clearance form as patient's surgery is supposed to be in the morning. Alejandra Wiley will have clearance form faxed back over to our office. S/w [place] at [employer]. Will fax to her to have Dr Caryl Comes sign while in clinic today if possible.

## 2016-09-22 NOTE — Patient Instructions (Signed)
1.) Reason for visit: EKG for pre-op clearance  2.) Name of MD requesting visit: Dr. [name]  3.) H&P: Pt with hx of afib, Medtronic pacemaker   4.) ROS related to problem: Pt scheduled for right total knee revision tomorrow. Needs EKG before clearance can be given.  5.) Assessment and plan per MD: EKG reviewed by Dr. Caryl Comes who states she is cleared for surgery. Pt notified while in office.

## 2016-09-23 ENCOUNTER — Ambulatory Visit: Payer: Medicare HMO | Admitting: Certified Registered"

## 2016-09-23 ENCOUNTER — Encounter
Admission: RE | Disposition: A | Discharge status: Skilled Nursing Facility | Payer: Self-pay | Source: Ambulatory Visit | Attending: Orthopedic Surgery

## 2016-09-23 ENCOUNTER — Ambulatory Visit
Admission: RE | Admit: 2016-09-23 | Discharge: 2016-09-24 | Disposition: A | Discharge status: Skilled Nursing Facility | Payer: Medicare HMO | Source: Ambulatory Visit | Attending: Orthopedic Surgery | Admitting: Orthopedic Surgery

## 2016-09-23 ENCOUNTER — Encounter: Payer: Self-pay | Admitting: *Deleted

## 2016-09-23 DIAGNOSIS — I4891 Unspecified atrial fibrillation: Secondary | ICD-10-CM | POA: Insufficient documentation

## 2016-09-23 DIAGNOSIS — Y792 Prosthetic and other implants, materials and accessory orthopedic devices associated with adverse incidents: Secondary | ICD-10-CM | POA: Diagnosis not present

## 2016-09-23 DIAGNOSIS — Z96651 Presence of right artificial knee joint: Secondary | ICD-10-CM | POA: Insufficient documentation

## 2016-09-23 DIAGNOSIS — Z8679 Personal history of other diseases of the circulatory system: Secondary | ICD-10-CM | POA: Diagnosis not present

## 2016-09-23 DIAGNOSIS — Z7901 Long term (current) use of anticoagulants: Secondary | ICD-10-CM | POA: Diagnosis not present

## 2016-09-23 DIAGNOSIS — T8484XA Pain due to internal orthopedic prosthetic devices, implants and grafts, initial encounter: Secondary | ICD-10-CM

## 2016-09-23 DIAGNOSIS — Z79899 Other long term (current) drug therapy: Secondary | ICD-10-CM | POA: Diagnosis not present

## 2016-09-23 DIAGNOSIS — Z950 Presence of cardiac pacemaker: Secondary | ICD-10-CM | POA: Diagnosis not present

## 2016-09-23 DIAGNOSIS — R001 Bradycardia, unspecified: Secondary | ICD-10-CM | POA: Diagnosis not present

## 2016-09-23 DIAGNOSIS — Z96659 Presence of unspecified artificial knee joint: Secondary | ICD-10-CM

## 2016-09-23 DIAGNOSIS — J329 Chronic sinusitis, unspecified: Secondary | ICD-10-CM | POA: Diagnosis not present

## 2016-09-23 DIAGNOSIS — T84022A Instability of internal right knee prosthesis, initial encounter: Secondary | ICD-10-CM | POA: Diagnosis not present

## 2016-09-23 HISTORY — PX: TOTAL KNEE REVISION: SHX996

## 2016-09-23 LAB — PROTIME-INR
INR: 1.22
Prothrombin Time: 15.3 seconds — ABNORMAL HIGH (ref 11.4–15.2)

## 2016-09-23 LAB — ABO/RH: ABO/RH(D): O POS

## 2016-09-23 SURGERY — TOTAL KNEE REVISION
Anesthesia: General | Site: Knee | Laterality: Right | Wound class: Clean

## 2016-09-23 MED ORDER — PHENOL 1.4 % MT LIQD: 1.0000 | OROMUCOSAL | Status: DC | PRN

## 2016-09-23 MED ORDER — WARFARIN SODIUM 2 MG PO TABS: 2.0000 mg | ORAL_TABLET | Freq: Two times a day (BID) | ORAL | Status: DC

## 2016-09-23 MED ORDER — HYDROMORPHONE HCL 1 MG/ML IJ SOLN
0.2500 mg | INTRAMUSCULAR | Status: AC | PRN
  Administered 2016-09-23 (×4): 0.25 mg via INTRAVENOUS

## 2016-09-23 MED ORDER — LACTATED RINGERS IV SOLN
INTRAVENOUS | Status: DC
  Administered 2016-09-23: 14:00:00 via INTRAVENOUS

## 2016-09-23 MED ORDER — WARFARIN - PHYSICIAN DOSING INPATIENT: Freq: Every day | Status: DC

## 2016-09-23 MED ORDER — ONDANSETRON HCL 4 MG/2ML IJ SOLN: 4.0000 mg | Freq: Four times a day (QID) | INTRAMUSCULAR | Status: DC | PRN

## 2016-09-23 MED ORDER — METOCLOPRAMIDE HCL 10 MG PO TABS: 5.0000 mg | ORAL_TABLET | Freq: Three times a day (TID) | ORAL | Status: DC | PRN

## 2016-09-23 MED ORDER — PHENYLEPHRINE HCL 10 MG/ML IJ SOLN
INTRAMUSCULAR | Status: DC | PRN
  Administered 2016-09-23 (×4): 100 ug via INTRAVENOUS

## 2016-09-23 MED ORDER — ADULT MULTIVITAMIN W/MINERALS CH
ORAL_TABLET | Freq: Every day | ORAL | Status: DC
  Administered 2016-09-24: 1 via ORAL
  Filled 2016-09-23: qty 1

## 2016-09-23 MED ORDER — PROPOFOL 10 MG/ML IV BOLUS
INTRAVENOUS | Status: AC
  Filled 2016-09-23: qty 20

## 2016-09-23 MED ORDER — BUPIVACAINE LIPOSOME 1.3 % IJ SUSP
INTRAMUSCULAR | Status: AC
  Filled 2016-09-23: qty 20

## 2016-09-23 MED ORDER — LIDOCAINE HCL (CARDIAC) 20 MG/ML IV SOLN
INTRAVENOUS | Status: DC | PRN
  Administered 2016-09-23: 40 mg via INTRAVENOUS

## 2016-09-23 MED ORDER — SODIUM CHLORIDE 0.9 % IJ SOLN
INTRAMUSCULAR | Status: AC
  Filled 2016-09-23: qty 50

## 2016-09-23 MED ORDER — NEOMYCIN-POLYMYXIN B GU 40-200000 IR SOLN
Status: AC
  Filled 2016-09-23: qty 20

## 2016-09-23 MED ORDER — LIDOCAINE HCL (PF) 2 % IJ SOLN
INTRAMUSCULAR | Status: AC
  Filled 2016-09-23: qty 2

## 2016-09-23 MED ORDER — ONDANSETRON HCL 4 MG/2ML IJ SOLN
INTRAMUSCULAR | Status: AC
  Filled 2016-09-23: qty 2

## 2016-09-23 MED ORDER — ACETAMINOPHEN 325 MG PO TABS: 650.0000 mg | ORAL_TABLET | Freq: Four times a day (QID) | ORAL | Status: DC | PRN

## 2016-09-23 MED ORDER — METOCLOPRAMIDE HCL 5 MG/ML IJ SOLN: 5.0000 mg | Freq: Three times a day (TID) | INTRAMUSCULAR | Status: DC | PRN

## 2016-09-23 MED ORDER — ONDANSETRON HCL 4 MG/2ML IJ SOLN: 4.0000 mg | Freq: Once | INTRAMUSCULAR | Status: DC | PRN

## 2016-09-23 MED ORDER — HYDROMORPHONE HCL 1 MG/ML IJ SOLN
INTRAMUSCULAR | Status: AC
  Administered 2016-09-23: 0.25 mg via INTRAVENOUS
  Filled 2016-09-23: qty 1

## 2016-09-23 MED ORDER — OXYCODONE HCL 5 MG PO TABS
5.0000 mg | ORAL_TABLET | ORAL | Status: DC | PRN
  Administered 2016-09-23: 5 mg via ORAL
  Administered 2016-09-24: 10 mg via ORAL
  Filled 2016-09-23: qty 2
  Filled 2016-09-23: qty 1

## 2016-09-23 MED ORDER — DEXAMETHASONE SODIUM PHOSPHATE 10 MG/ML IJ SOLN
INTRAMUSCULAR | Status: AC
  Filled 2016-09-23: qty 1

## 2016-09-23 MED ORDER — ACETAMINOPHEN 650 MG RE SUPP: 650.0000 mg | Freq: Four times a day (QID) | RECTAL | Status: DC | PRN

## 2016-09-23 MED ORDER — ACETAMINOPHEN 10 MG/ML IV SOLN
INTRAVENOUS | Status: AC
  Filled 2016-09-23: qty 100

## 2016-09-23 MED ORDER — HYDRALAZINE HCL 20 MG/ML IJ SOLN
INTRAMUSCULAR | Status: AC
  Filled 2016-09-23: qty 1

## 2016-09-23 MED ORDER — WARFARIN SODIUM 4 MG PO TABS
4.0000 mg | ORAL_TABLET | Freq: Every day | ORAL | Status: DC
  Administered 2016-09-23: 4 mg via ORAL
  Filled 2016-09-23 (×2): qty 1

## 2016-09-23 MED ORDER — ONDANSETRON HCL 4 MG PO TABS: 4.0000 mg | ORAL_TABLET | Freq: Four times a day (QID) | ORAL | Status: DC | PRN

## 2016-09-23 MED ORDER — FENTANYL CITRATE (PF) 100 MCG/2ML IJ SOLN
INTRAMUSCULAR | Status: AC
  Filled 2016-09-23: qty 2

## 2016-09-23 MED ORDER — NEOMYCIN-POLYMYXIN B GU 40-200000 IR SOLN
Status: DC | PRN
  Administered 2016-09-23: 14 mL

## 2016-09-23 MED ORDER — ONDANSETRON HCL 4 MG/2ML IJ SOLN
INTRAMUSCULAR | Status: DC | PRN
  Administered 2016-09-23: 4 mg via INTRAVENOUS

## 2016-09-23 MED ORDER — FENTANYL CITRATE (PF) 100 MCG/2ML IJ SOLN
INTRAMUSCULAR | Status: DC | PRN
  Administered 2016-09-23 (×2): 25 ug via INTRAVENOUS

## 2016-09-23 MED ORDER — FLUTICASONE PROPIONATE 50 MCG/ACT NA SUSP
2.0000 | Freq: Every day | NASAL | Status: DC
  Filled 2016-09-23: qty 16

## 2016-09-23 MED ORDER — FENTANYL CITRATE (PF) 100 MCG/2ML IJ SOLN
INTRAMUSCULAR | Status: AC
  Administered 2016-09-23: 25 ug via INTRAVENOUS
  Filled 2016-09-23: qty 2

## 2016-09-23 MED ORDER — HYDRALAZINE HCL 20 MG/ML IJ SOLN
10.0000 mg | Freq: Once | INTRAMUSCULAR | Status: AC
  Administered 2016-09-23: 10 mg via INTRAVENOUS

## 2016-09-23 MED ORDER — ACETAMINOPHEN 10 MG/ML IV SOLN
INTRAVENOUS | Status: DC | PRN
  Administered 2016-09-23: 1000 mg via INTRAVENOUS

## 2016-09-23 MED ORDER — SODIUM CHLORIDE 0.9 % IV SOLN
INTRAVENOUS | Status: DC | PRN
  Administered 2016-09-23: 60 mL

## 2016-09-23 MED ORDER — SODIUM CHLORIDE 0.9 % IV SOLN
INTRAVENOUS | Status: DC
  Administered 2016-09-23: 19:00:00 via INTRAVENOUS

## 2016-09-23 MED ORDER — FLECAINIDE ACETATE 50 MG PO TABS
75.0000 mg | ORAL_TABLET | Freq: Two times a day (BID) | ORAL | Status: DC
  Administered 2016-09-23 – 2016-09-24 (×2): 75 mg via ORAL
  Filled 2016-09-23 (×3): qty 2

## 2016-09-23 MED ORDER — MENTHOL 3 MG MT LOZG: 1.0000 | LOZENGE | OROMUCOSAL | Status: DC | PRN

## 2016-09-23 MED ORDER — FAMOTIDINE 20 MG PO TABS
20.0000 mg | ORAL_TABLET | Freq: Once | ORAL | Status: AC
  Administered 2016-09-23: 20 mg via ORAL

## 2016-09-23 MED ORDER — PROPOFOL 10 MG/ML IV BOLUS
INTRAVENOUS | Status: DC | PRN
  Administered 2016-09-23: 100 mg via INTRAVENOUS

## 2016-09-23 MED ORDER — NADOLOL 20 MG PO TABS
20.0000 mg | ORAL_TABLET | Freq: Every day | ORAL | Status: DC
  Administered 2016-09-24: 20 mg via ORAL
  Filled 2016-09-23: qty 1

## 2016-09-23 MED ORDER — FENTANYL CITRATE (PF) 100 MCG/2ML IJ SOLN
25.0000 ug | INTRAMUSCULAR | Status: AC | PRN
  Administered 2016-09-23 (×6): 25 ug via INTRAVENOUS

## 2016-09-23 MED ORDER — CEFAZOLIN SODIUM-DEXTROSE 2-4 GM/100ML-% IV SOLN
2.0000 g | Freq: Four times a day (QID) | INTRAVENOUS | Status: AC
  Administered 2016-09-23 – 2016-09-24 (×3): 2 g via INTRAVENOUS
  Filled 2016-09-23 (×3): qty 100

## 2016-09-23 MED ORDER — MORPHINE SULFATE (PF) 2 MG/ML IV SOLN: 2.0000 mg | INTRAVENOUS | Status: DC | PRN

## 2016-09-23 MED ORDER — CEFAZOLIN SODIUM-DEXTROSE 2-4 GM/100ML-% IV SOLN
INTRAVENOUS | Status: AC
  Filled 2016-09-23: qty 100

## 2016-09-23 MED ORDER — DEXAMETHASONE SODIUM PHOSPHATE 10 MG/ML IJ SOLN
INTRAMUSCULAR | Status: DC | PRN
  Administered 2016-09-23: 4 mg via INTRAVENOUS

## 2016-09-23 MED ORDER — FAMOTIDINE 20 MG PO TABS
ORAL_TABLET | ORAL | Status: AC
  Administered 2016-09-23: 20 mg via ORAL
  Filled 2016-09-23: qty 1

## 2016-09-23 SURGICAL SUPPLY — 60 items
BANDAGE ACE 6X5 VEL STRL LF (GAUZE/BANDAGES/DRESSINGS) ×2 IMPLANT
BLADE SAW 1 (BLADE) ×2 IMPLANT
BLADE SAW 1/2 (BLADE) ×2 IMPLANT
CANISTER SUCT 1200ML W/VALVE (MISCELLANEOUS) ×2 IMPLANT
CANISTER SUCT 3000ML PPV (MISCELLANEOUS) ×4 IMPLANT
CATH FOL LEG HOLDER (MISCELLANEOUS) ×2 IMPLANT
CATH TRAY METER 16FR LF (MISCELLANEOUS) ×2 IMPLANT
CHLORAPREP W/TINT 26ML (MISCELLANEOUS) ×4 IMPLANT
COOLER POLAR GLACIER W/PUMP (MISCELLANEOUS) ×2 IMPLANT
COVER TABLE BACK 60X90 (DRAPES) ×2 IMPLANT
CUFF TOURN 24 STER (MISCELLANEOUS) IMPLANT
CUFF TOURN 30 STER DUAL PORT (MISCELLANEOUS) IMPLANT
DRAPE SHEET LG 3/4 BI-LAMINATE (DRAPES) ×8 IMPLANT
ELECT CAUTERY BLADE 6.4 (BLADE) ×2 IMPLANT
ELECT REM PT RETURN 9FT ADLT (ELECTROSURGICAL) ×2
ELECTRODE REM PT RTRN 9FT ADLT (ELECTROSURGICAL) ×1 IMPLANT
GAUZE PETRO XEROFOAM 1X8 (MISCELLANEOUS) ×2 IMPLANT
GAUZE SPONGE 4X4 12PLY STRL (GAUZE/BANDAGES/DRESSINGS) ×2 IMPLANT
GLOVE BIOGEL PI IND STRL 9 (GLOVE) ×1 IMPLANT
GLOVE BIOGEL PI INDICATOR 9 (GLOVE) ×1
GLOVE INDICATOR 8.0 STRL GRN (GLOVE) ×2 IMPLANT
GLOVE SURG ORTHO 8.0 STRL STRW (GLOVE) ×2 IMPLANT
GLOVE SURG SYN 9.0  PF PI (GLOVE) ×1
GLOVE SURG SYN 9.0 PF PI (GLOVE) ×1 IMPLANT
GOWN SRG 2XL LVL 4 RGLN SLV (GOWNS) ×1 IMPLANT
GOWN STRL NON-REIN 2XL LVL4 (GOWNS) ×2
GOWN STRL REUS W/ TWL LRG LVL3 (GOWN DISPOSABLE) ×1 IMPLANT
GOWN STRL REUS W/ TWL XL LVL3 (GOWN DISPOSABLE) ×1 IMPLANT
GOWN STRL REUS W/TWL LRG LVL3 (GOWN DISPOSABLE) ×2
GOWN STRL REUS W/TWL XL LVL3 (GOWN DISPOSABLE) ×2
HOOD PEEL AWAY FLYTE STAYCOOL (MISCELLANEOUS) ×4 IMPLANT
IMMBOLIZER KNEE 19 BLUE UNIV (SOFTGOODS) ×2 IMPLANT
INSERT XLPE 15MM SZ 5-6 (Knees) ×1 IMPLANT
KIT RM TURNOVER STRD PROC AR (KITS) ×2 IMPLANT
KNIFE SCULPS 14X20 (INSTRUMENTS) ×2 IMPLANT
NDL SPNL 18GX3.5 QUINCKE PK (NEEDLE) ×1 IMPLANT
NDL SPNL 20GX3.5 QUINCKE YW (NEEDLE) ×1 IMPLANT
NEEDLE SPNL 18GX3.5 QUINCKE PK (NEEDLE) ×2 IMPLANT
NEEDLE SPNL 20GX3.5 QUINCKE YW (NEEDLE) ×2 IMPLANT
NS IRRIG 1000ML POUR BTL (IV SOLUTION) ×2 IMPLANT
PACK TOTAL KNEE (MISCELLANEOUS) ×2 IMPLANT
PAD WRAPON POLAR KNEE (MISCELLANEOUS) ×1 IMPLANT
PULSAVAC PLUS IRRIG FAN TIP (DISPOSABLE) ×2
SOL .9 NS 3000ML IRR  AL (IV SOLUTION) ×1
SOL .9 NS 3000ML IRR AL (IV SOLUTION) ×1
SOL .9 NS 3000ML IRR UROMATIC (IV SOLUTION) ×1 IMPLANT
STAPLER SKIN PROX 35W (STAPLE) ×2 IMPLANT
SUCTION FRAZIER HANDLE 10FR (MISCELLANEOUS) ×1
SUCTION TUBE FRAZIER 10FR DISP (MISCELLANEOUS) ×1 IMPLANT
SUT DVC 2 QUILL PDO  T11 36X36 (SUTURE) ×1
SUT DVC 2 QUILL PDO T11 36X36 (SUTURE) ×1 IMPLANT
SUT DVC QUILL MONODERM 30X30 (SUTURE) ×2 IMPLANT
SUT TICRON 2-0 30IN 311381 (SUTURE) IMPLANT
SWAB CULTURE AMIES ANAERIB BLU (MISCELLANEOUS) IMPLANT
SYR 20CC LL (SYRINGE) ×2 IMPLANT
SYR 50ML LL SCALE MARK (SYRINGE) ×4 IMPLANT
TIP FAN IRRIG PULSAVAC PLUS (DISPOSABLE) ×1 IMPLANT
TOWEL OR 17X26 4PK STRL BLUE (TOWEL DISPOSABLE) ×2 IMPLANT
TOWER CARTRIDGE SMART MIX (DISPOSABLE) ×2 IMPLANT
WRAPON POLAR PAD KNEE (MISCELLANEOUS) ×2

## 2016-09-23 NOTE — Op Note (Signed)
09/23/2016  4:06 PM  PATIENT:  Alejandra Wiley  83 y.o. female  PRE-OPERATIVE DIAGNOSIS:  PAIN DUE TO TOTAL KNEE REPLACEMENT  POST-OPERATIVE DIAGNOSIS:  PAIN DUE TO TOTAL KNEE REPLACEMENT  PROCEDURE:  Procedure(s): TOTAL KNEE REVISION-POLYETHYLENE EXCHANGE (Right)  SURGEON: Laurene Footman, MD  ASSISTANTS: None  ANESTHESIA:   general  EBL:  Total I/O In: 600 [I.V.:600] Out: 25 [Blood:25]  BLOOD ADMINISTERED:none  DRAINS: none   LOCAL MEDICATIONS USED:  OTHER Exparel  SPECIMEN:  No Specimen  DISPOSITION OF SPECIMEN:  N/A  COUNTS:  YES  TOURNIQUET:   22 minutes at 300 mmHg  IMPLANTS: Smith [employer] Nephew size 5-6:15 millimeter Legion PS XLP E high flexion articular insert  DICTATION: .Dragon Dictation patient brought the operating room and after adequate anesthesia was obtained the right leg was prepped and draped in sterile fashion was turned by the upper thigh. After patient identification and timeout procedures were completed tourniquet was raised. The prior midline skin incision was made followed by medial parapatellar arthrotomy. Inspection knee revealed mild synovitis and this was debrided and at the level of the joint there was synovium that was pinching into the joint medial and lateral there about 2 mm opening to varus valgus stress consistent with preoperative findings. The prior polyethylene insert was removed and identified and size checked for subsequent insertion of new plastic. The some of the scar tissue around the edges of the implant were removed and debrided so to be no impingement of the new implant trials were placed and the 15 mm implant gave excellent stability with good tracking of the patella at this point with the trial removed X Burrell was injected in the periarticular tissue with saline followed by pulsatile lavage of the knee. The final polyethylene insert was then snapped into position and checked that it was stable following this the tourniquet was let down  hemostasis checked electrocautery the arthrotomy repaired with a heavy Quill. 3-0 v-loc was used subcuticularly and 4-0 nylon was used for the skin because of a nickel allergy. A incisional wound VAC was then applied  PLAN OF CARE: Admit for overnight observation  PATIENT DISPOSITION:  PACU - hemodynamically stable.

## 2016-09-23 NOTE — Transfer of Care (Signed)
Immediate Anesthesia Transfer of Care Note  Patient: Alejandra Wiley  Procedure(s) Performed: Procedure(s): TOTAL KNEE REVISION-POLYETHYLENE EXCHANGE (Right)  Patient Location: PACU  Anesthesia Type:General  Level of Consciousness: sedated  Airway & Oxygen Therapy: Patient Spontanous Breathing and Patient connected to face mask oxygen  Post-op Assessment: Report given to RN and Post -op Vital signs reviewed and stable  Post vital signs: Reviewed and stable  Last Vitals:  Vitals:   09/23/16 1325 09/23/16 1556  BP: (!) 174/75 (!) 178/66  Pulse: 77 62  Resp: 16 12  Temp: (!) 35.9 C 36.7 C  SpO2: 99% 100%    Last Pain:  Vitals:   09/23/16 1325  TempSrc: Tympanic  PainSc: 0-No pain         Complications: No apparent anesthesia complications

## 2016-09-23 NOTE — Progress Notes (Signed)
[place] responded to OR for pt that in comments line stated "Pt sole care giver for husband w/dementia, she's concerned about him and their children live out of the state." [facility] met with pt. Pt states she has been concerned about her husband is now staying at [facility]. Pt states she had a lot going in her life this year. While visiting to attend the Christening of greatgrandson she fell and broke her hip and injuring her knee. Pt had 2 surgeries and her husband had one surgery this year, she said. Pt appeared anxious but calm. [place] listened to pt's concerns, offered prayers for pt and for her husband. Pt also prayed for [facility] and thanked [facility] for visiting and listening to her.   09/23/16 2000  Clinical Encounter Type  Visited With Patient  Visit Type Initial;Spiritual support;Other (Comment)  Referral From Nurse  Consult/Referral To Chaplain  Spiritual Encounters  Spiritual Needs Prayer;Emotional;Other (Comment)

## 2016-09-23 NOTE — H&P (Signed)
Reviewed paper H+P, will be scanned into chart. No changes noted.  

## 2016-09-23 NOTE — Anesthesia Procedure Notes (Signed)
Procedure Name: LMA Insertion Performed by: Kaysin Brock Pre-anesthesia Checklist: Patient identified, Patient being monitored, Timeout performed, Emergency Drugs available and Suction available Patient Re-evaluated:Patient Re-evaluated prior to induction Oxygen Delivery Method: Circle system utilized Preoxygenation: Pre-oxygenation with 100% oxygen Induction Type: IV induction LMA: LMA inserted LMA Size: 3.5 Tube type: Oral Number of attempts: 1 Placement Confirmation: positive ETCO2 and breath sounds checked- equal and bilateral Tube secured with: Tape Dental Injury: Teeth and Oropharynx as per pre-operative assessment        

## 2016-09-23 NOTE — Anesthesia Post-op Follow-up Note (Signed)
Anesthesia QCDR form completed.        

## 2016-09-23 NOTE — Anesthesia Preprocedure Evaluation (Signed)
Anesthesia Evaluation  Patient identified by MRN, date of birth, ID band Patient awake    Reviewed: Allergy & Precautions, H&P , NPO status , Patient's Chart, lab work & pertinent test results, reviewed documented beta blocker date and time   Airway Mallampati: II   Neck ROM: full    Dental  (+) Poor Dentition   Pulmonary neg pulmonary ROS,    Pulmonary exam normal        Cardiovascular negative cardio ROS Normal cardiovascular exam+ pacemaker  Rhythm:regular Rate:Normal     Neuro/Psych negative neurological ROS  negative psych ROS   GI/Hepatic negative GI ROS, Neg liver ROS,   Endo/Other  negative endocrine ROS  Renal/GU negative Renal ROS  negative genitourinary   Musculoskeletal   Abdominal   Peds  Hematology negative hematology ROS (+)   Anesthesia Other Findings Past Medical History: No date: Arrhythmia     Comment:  A-Fib No date: Bradycardia No date: Cancer [facility])     Comment:  vaginal intraepithelial neoplasia 12/06/2003: Pacemaker     Comment:  Medtronic Impulse DTDR01 2015: Pacemaker No date: Sick sinus syndrome ([place]) No date: Sinoatrial node dysfunction ([place]) 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 Past Surgical History: No date: APPENDECTOMY No date: HERNIA REPAIR     Comment:  lower abd 2015: IMAGE GUIDED SINUS SURGERY 12/06/2003: INSERT / REPLACE / REMOVE PACEMAKER     Comment:  Medtronic impulse DTDR01 No date: JOINT REPLACEMENT 1999: LEEP 02/2016: ORIF HIP FRACTURE; Right 09/18/2012: PACEMAKER GENERATOR CHANGE; N/A     Comment:  Procedure: PACEMAKER GENERATOR CHANGE;  Surgeon: Deboraha Sprang, MD;  Location: [facility] CATH LAB;  Service:               Cardiovascular;  Laterality: N/A; No date: TONSILLECTOMY No date: TOTAL KNEE ARTHROPLASTY No date: VAGINAL HYSTERECTOMY   Reproductive/Obstetrics negative OB ROS                             Anesthesia  Physical Anesthesia Plan  ASA: III  Anesthesia Plan: General and General LMA   Post-op Pain Management:    Induction:   PONV Risk Score and Plan:   Airway Management Planned:   Additional Equipment:   Intra-op Plan:   Post-operative Plan:   Informed Consent: I have reviewed the patients History and Physical, chart, labs and discussed the procedure including the risks, benefits and alternatives for the proposed anesthesia with the patient or authorized representative who has indicated his/her understanding and acceptance.   Dental Advisory Given  Plan Discussed with: CRNA  Anesthesia Plan Comments:         Anesthesia Quick Evaluation

## 2016-09-24 ENCOUNTER — Encounter: Payer: Self-pay | Admitting: Orthopedic Surgery

## 2016-09-24 DIAGNOSIS — Z96651 Presence of right artificial knee joint: Secondary | ICD-10-CM | POA: Diagnosis not present

## 2016-09-24 DIAGNOSIS — M1711 Unilateral primary osteoarthritis, right knee: Secondary | ICD-10-CM | POA: Diagnosis not present

## 2016-09-24 DIAGNOSIS — I480 Paroxysmal atrial fibrillation: Secondary | ICD-10-CM | POA: Diagnosis not present

## 2016-09-24 DIAGNOSIS — T8484XA Pain due to internal orthopedic prosthetic devices, implants and grafts, initial encounter: Secondary | ICD-10-CM | POA: Diagnosis not present

## 2016-09-24 DIAGNOSIS — I495 Sick sinus syndrome: Secondary | ICD-10-CM | POA: Diagnosis not present

## 2016-09-24 DIAGNOSIS — R269 Unspecified abnormalities of gait and mobility: Secondary | ICD-10-CM | POA: Diagnosis not present

## 2016-09-24 DIAGNOSIS — F430 Acute stress reaction: Secondary | ICD-10-CM | POA: Diagnosis not present

## 2016-09-24 DIAGNOSIS — M6281 Muscle weakness (generalized): Secondary | ICD-10-CM | POA: Diagnosis not present

## 2016-09-24 DIAGNOSIS — I4891 Unspecified atrial fibrillation: Secondary | ICD-10-CM | POA: Diagnosis not present

## 2016-09-24 LAB — BASIC METABOLIC PANEL
Anion gap: 7 (ref 5–15)
BUN: 17 mg/dL (ref 6–20)
CALCIUM: 7.8 mg/dL — AB (ref 8.9–10.3)
CO2: 23 mmol/L (ref 22–32)
CREATININE: 0.64 mg/dL (ref 0.44–1.00)
Chloride: 105 mmol/L (ref 101–111)
GLUCOSE: 115 mg/dL — AB (ref 65–99)
Potassium: 3.9 mmol/L (ref 3.5–5.1)
Sodium: 135 mmol/L (ref 135–145)

## 2016-09-24 LAB — CBC
HEMATOCRIT: 29.3 % — AB (ref 35.0–47.0)
Hemoglobin: 10 g/dL — ABNORMAL LOW (ref 12.0–16.0)
MCH: 30.7 pg (ref 26.0–34.0)
MCHC: 34.2 g/dL (ref 32.0–36.0)
MCV: 89.7 fL (ref 80.0–100.0)
PLATELETS: 189 10*3/uL (ref 150–440)
RBC: 3.27 MIL/uL — ABNORMAL LOW (ref 3.80–5.20)
RDW: 14.8 % — ABNORMAL HIGH (ref 11.5–14.5)
WBC: 8.3 10*3/uL (ref 3.6–11.0)

## 2016-09-24 MED ORDER — OXYCODONE HCL 5 MG PO TABS: 5.0000 mg | ORAL_TABLET | ORAL | 0 refills | Status: DC | PRN

## 2016-09-24 NOTE — Discharge Summary (Signed)
Physician Discharge Summary  Patient ID: Alejandra Wiley MRN: [id number] DOB/AGE: 25-Jan-1932 83 y.o.  Admit date: 09/23/2016 Discharge date: 09/24/2016  Admission Diagnoses:  PAIN DUE TO TOTAL KNEE REPLACEMENT   Discharge Diagnoses: Patient Active Problem List   Diagnosis Date Noted  . Painful total knee replacement ([place]) 09/23/2016  . 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 ATRIAL FIBRILLATION 06/13/2008  . BRADYCARDIA 06/13/2008  . PACEMAKER-Medtronic 06/13/2008    Past Medical History:  Diagnosis Date  . Arrhythmia    A-Fib  . Bradycardia   . 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 Pacemaker 12/06/2003   Medtronic Impulse DTDR01  . Pacemaker 2015  . Sick sinus syndrome ([place])   . Sinoatrial node dysfunction ([facility])   . Vaginal intraepithelial neoplasia      Transfusion: none   Consultants (if any):   Discharged Condition: Improved  Hospital Course: Alejandra Wiley is an 83 y.o. female who was admitted 09/23/2016 with a diagnosis of Painful total knee and went to the operating room on 09/23/2016 and underwent the above named procedures.    Surgeries: Procedure(s): TOTAL KNEE REVISION-POLYETHYLENE EXCHANGE on 09/23/2016 Patient tolerated the surgery well. Taken to PACU where she was stabilized and then transferred to the orthopedic floor.  Started on Coumadin. Foot pumps applied bilaterally at 80 mm. Heels elevated on bed with rolled towels. No evidence of DVT. Negative [place]. Physical therapy started on day #1 for gait training and transfer. OT started day #1 for ADL and assisted devices.  Patient's foley was d/c on day #1. Patient's IV was d/c on day #1.  On post op day #1patient was stable and ready for discharge to SNF.  Remove wound vac and apply honey comb dressing on 10/01/16  Implants: [employer] size 5-6:15 millimeter Legion PS XLP E high flexion articular insert  She was given perioperative antibiotics:  Anti-infectives    Start     Dose/Rate Route  Frequency Ordered Stop   09/23/16 1830  ceFAZolin (ANCEF) IVPB 2g/100 mL premix     2 g 200 mL/hr over 30 Minutes Intravenous Every 6 hours 09/23/16 1825 09/24/16 0606   09/23/16 1302  ceFAZolin (ANCEF) 2-4 GM/100ML-% IVPB    Comments:  Ronnell [place]   : cabinet override      09/23/16 1302 09/24/16 0114   09/22/16 2315  vancomycin (VANCOCIN) 500 mg in sodium chloride 0.9 % 100 mL IVPB     500 mg 100 mL/hr over 60 Minutes Intravenous  Once 09/22/16 2302 09/23/16 1508   09/22/16 2315  ceFAZolin (ANCEF) 2,000 mg in dextrose 5 % 100 mL IVPB     2,000 mg 240 mL/hr over 30 Minutes Intravenous  Once 09/22/16 2302 09/23/16 1503    .  She was given sequential compression devices, early ambulation, and Coumadin for DVT prophylaxis.  She benefited maximally from the hospital stay and there were no complications.    Recent vital signs:  Vitals:   09/24/16 0100 09/24/16 0400  BP: (!) 100/46 (!) 111/44  Pulse: (!) 59 (!) 59  Resp:  19  Temp:  98 F (36.7 C)  SpO2:  97%    Recent laboratory studies:  Lab Results  Component Value Date   HGB 10.0 (L) 09/24/2016   HGB 12.2 09/17/2016   HGB 11.5 (L) 08/25/2013   Lab Results  Component Value Date   WBC 8.3 09/24/2016   PLT 189 09/24/2016   Lab Results  Component Value Date   INR 1.22  09/23/2016   Lab Results  Component Value Date   NA 135 09/24/2016   K 3.9 09/24/2016   CL 105 09/24/2016   CO2 23 09/24/2016   BUN 17 09/24/2016   CREATININE 0.64 09/24/2016   GLUCOSE 115 (H) 09/24/2016    Discharge Medications:   Allergies as of 09/24/2016      Reactions   Latex Other (See Comments)   blisters   Tape Other (See Comments)   Blisters/ paper tape is Ok   Xarelto [rivaroxaban] Other (See Comments)   Gums bleeding and too much bruising   Nickel Rash      Medication List    TAKE these medications   acetaminophen 500 MG tablet Commonly known as:  TYLENOL Take 500 mg by mouth every 8 (eight) hours as needed for mild pain  or headache.   diphenhydrAMINE 25 mg capsule Commonly known as:  BENADRYL Take 25 mg by mouth every 8 (eight) hours as needed. Takes along with Tylenol   flecainide 50 MG tablet Commonly known as:  TAMBOCOR Take 1.5 tablets (75 mg total) by mouth 2 (two) times daily.   fluticasone 50 MCG/ACT nasal spray Commonly known as:  FLONASE Place 2 sprays into both nostrils daily.   Glucosamine HCl 1500 MG Tabs Take 1,500 mg by mouth daily.   MULTI VITAMIN DAILY PO Take 1 tablet by mouth daily. Centrum silver   nadolol 20 MG tablet Commonly known as:  CORGARD Take 20 mg by mouth daily.   OVER THE COUNTER MEDICATION Place 1 spray into the nose daily. NeilMed Sinus Rinse   oxyCODONE 5 MG immediate release tablet Commonly known as:  Oxy IR/ROXICODONE Take 1-2 tablets (5-10 mg total) by mouth every 4 (four) hours as needed for breakthrough pain.   warfarin 1 MG tablet Commonly known as:  COUMADIN Take 2 mg by mouth 2 (two) times daily. Takes 2 tablets in the morning and 2 in the evening            Durable Medical Equipment        Start     Ordered   09/23/16 1826  DME Walker rolling  Once    Question:  Patient needs a walker to treat with the following condition  Answer:  Status post revision of total knee replacement, right   09/23/16 1825   09/23/16 1826  DME 3 n 1  Once     09/23/16 1825   09/23/16 1826  DME Bedside commode  Once    Question:  Patient needs a bedside commode to treat with the following condition  Answer:  Status post revision of total knee   09/23/16 1825       Discharge Care Instructions        Start     Ordered   09/24/16 0000  oxyCODONE (OXY IR/ROXICODONE) 5 MG immediate release tablet  Every 4 hours PRN    Question:  Supervising Provider  AnswerHessie Knows   09/24/16 3086      Diagnostic Studies: Ct Knee Right Wo Contrast  Result Date: 09/08/2016 CLINICAL DATA:  Chronic medial and lateral knee pain without swelling. Limited range of  motion. Knee replacement x3. EXAM: CT OF THE RIGHT KNEE WITHOUT CONTRAST TECHNIQUE: Multidetector CT imaging of the RIGHT knee was performed according to the standard protocol. Multiplanar CT image reconstructions were also generated. COMPARISON:  None. FINDINGS: Bones/Joint/Cartilage Streak artifacts from the patient's indwelling right total knee arthroplasty limits assessment. No conclusive evidence for hardware  loosening or failure. No lucencies are seen at the cement-prosthetic interface. Small suprapatellar joint effusion is partially imaged with a few scattered punctate metallic densities likely secondary to prior surgeries. No focal bony lucency or fracture is identified. No soft tissue mass. Ligaments Suboptimally assessed by CT. Muscles and Tendons No intramuscular hemorrhage or mass.  No atrophy. Soft tissues No significant soft swelling. IMPRESSION: 1. No CT evidence of hardware failure or loosening of an indwelling right total knee arthroplasty. 2. No acute fracture, bone destruction or malalignment of the right knee. Small suprapatellar joint effusion is partially imaged. Electronically Signed   By: Ashley Royalty M.D.   On: 09/08/2016 00:21    Disposition:      Contact information for follow-up providers    Hessie Knows, MD Follow up in 2 week(s).   Specialty:  Orthopedic Surgery Why:  for suture removal Contact information: [place] [address] [phone]            Contact information for after-discharge care    Destination    [facility] Follow up.   Specialty:  Skilled Nursing Facility Contact information: [place] [phone]                   Signed: Feliberto Gottron 09/24/2016, 9:33 AM

## 2016-09-24 NOTE — Discharge Instructions (Signed)
TOTAL KNEE REVISION OF POLYETHYLENE COMPONENT POSTOPERATIVE DIRECTIONS  Knee Rehabilitation, Guidelines Following Surgery  Results after knee surgery are often greatly improved when you follow the exercise, range of motion and muscle strengthening exercises prescribed by your doctor. Safety measures are also important to protect the knee from further injury. Any time any of these exercises cause you to have increased pain or swelling in your knee joint, decrease the amount until you are comfortable again and slowly increase them. If you have problems or questions, call your caregiver or physical therapist for advice.   HOME CARE INSTRUCTIONS  Remove items at home which could result in a fall. This includes throw rugs or furniture in walking pathways.   Continue to use polar care unit on the knee for pain and swelling from surgery. You may notice swelling that will progress down to the foot and ankle.  This is normal after surgery.  Elevate the leg when you are not up walking on it.    Continue to use the breathing machine which will help keep your temperature down.  It is common for your temperature to cycle up and down following surgery, especially at night when you are not up moving around and exerting yourself.  The breathing machine keeps your lungs expanded and your temperature down.  Do not place pillow under knee, focus on keeping the knee STRAIGHT while resting  DIET You may resume your previous home diet once your are discharged from the hospital.  DRESSING / WOUND CARE / SHOWERING Remove wound vac on 10/01/16 and apply honeycomb dressing   ACTIVITY Walk with your walker as instructed. Use walker as long as suggested by your caregivers. Avoid periods of inactivity such as sitting longer than an hour when not asleep. This helps prevent blood clots.  You may resume a sexual relationship in one month or when given the OK by your doctor.  You may return to work once you are cleared  by your doctor.  Do not drive a car for 6 weeks or until released by you surgeon.  Do not drive while taking narcotics.  WEIGHT BEARING Weight bearing as tolerated with assist device (walker, cane, etc) as directed, use it as long as suggested by your surgeon or therapist, typically at least 4-6 weeks.  POSTOPERATIVE CONSTIPATION PROTOCOL Constipation - defined medically as fewer than three stools per week and severe constipation as less than one stool per week.  One of the most common issues patients have following surgery is constipation.  Even if you have a regular bowel pattern at home, your normal regimen is likely to be disrupted due to multiple reasons following surgery.  Combination of anesthesia, postoperative narcotics, change in appetite and fluid intake all can affect your bowels.  In order to avoid complications following surgery, here are some recommendations in order to help you during your recovery period.  Colace (docusate) - Pick up an over-the-counter form of Colace or another stool softener and take twice a day as long as you are requiring postoperative pain medications.  Take with a full glass of water daily.  If you experience loose stools or diarrhea, hold the colace until you stool forms back up.  If your symptoms do not get better within 1 week or if they get worse, check with your doctor.  Dulcolax (bisacodyl) - Pick up over-the-counter and take as directed by the product packaging as needed to assist with the movement of your bowels.  Take with a full glass  of water.  Use this product as needed if not relieved by Colace only.   MiraLax (polyethylene glycol) - Pick up over-the-counter to have on hand.  MiraLax is a solution that will increase the amount of water in your bowels to assist with bowel movements.  Take as directed and can mix with a glass of water, juice, soda, coffee, or tea.  Take if you go more than two days without a movement. Do not use MiraLax more than  once per day. Call your doctor if you are still constipated or irregular after using this medication for 7 days in a row.  If you continue to have problems with postoperative constipation, please contact the office for further assistance and recommendations.  If you experience "the worst abdominal pain ever" or develop nausea or vomiting, please contact the office immediatly for further recommendations for treatment.  ITCHING  If you experience itching with your medications, try taking only a single pain pill, or even half a pain pill at a time.  You can also use Benadryl over the counter for itching or also to help with sleep.   TED HOSE STOCKINGS Wear the elastic stockings on both legs for six weeks following surgery during the day but you may remove then at night for sleeping.  MEDICATIONS See your medication summary on the After Visit Summary that the nursing staff will review with you prior to discharge.  You may have some home medications which will be placed on hold until you complete the course of blood thinner medication.  It is important for you to complete the blood thinner medication as prescribed by your surgeon.  Continue your approved medications as instructed at time of discharge.  PRECAUTIONS If you experience chest pain or shortness of breath - call 911 immediately for transfer to the hospital emergency department.  If you develop a fever greater that 101 F, purulent drainage from wound, increased redness or drainage from wound, foul odor from the wound/dressing, or calf pain - CONTACT YOUR SURGEON.                                                   FOLLOW-UP APPOINTMENTS Make sure you keep all of your appointments after your operation with your surgeon and caregivers. You should call the office at the above phone number and make an appointment for approximately two weeks after the date of your surgery or on the date instructed by your surgeon outlined in the "After Visit  Summary".   RANGE OF MOTION AND STRENGTHENING EXERCISES  Rehabilitation of the knee is important following a knee injury or an operation. After just a few days of immobilization, the muscles of the thigh which control the knee become weakened and shrink (atrophy). Knee exercises are designed to build up the tone and strength of the thigh muscles and to improve knee motion. Often times heat used for twenty to thirty minutes before working out will loosen up your tissues and help with improving the range of motion but do not use heat for the first two weeks following surgery. These exercises can be done on a training (exercise) mat, on the floor, on a table or on a bed. Use what ever works the best and is most comfortable for you Knee exercises include:  Leg Lifts - While your knee is still immobilized in  a splint or cast, you can do straight leg raises. Lift the leg to 60 degrees, hold for 3 sec, and slowly lower the leg. Repeat 10-20 times 2-3 times daily. Perform this exercise against resistance later as your knee gets better.  Quad and Hamstring Sets - Tighten up the muscle on the front of the thigh (Quad) and hold for 5-10 sec. Repeat this 10-20 times hourly. Hamstring sets are done by pushing the foot backward against an object and holding for 5-10 sec. Repeat as with quad sets.   Leg Slides: Lying on your back, slowly slide your foot toward your buttocks, bending your knee up off the floor (only go as far as is comfortable). Then slowly slide your foot back down until your leg is flat on the floor again.  Angel Wings: Lying on your back spread your legs to the side as far apart as you can without causing discomfort.  A rehabilitation program following serious knee injuries can speed recovery and prevent re-injury in the future due to weakened muscles. Contact your doctor or a physical therapist for more information on knee rehabilitation.   IF YOU ARE TRANSFERRED TO A SKILLED REHAB FACILITY If the  patient is transferred to a skilled rehab facility following release from the hospital, a list of the current medications will be sent to the facility for the patient to continue.  When discharged from the skilled rehab facility, please have the facility set up the patient's [place] prior to being released. Also, the skilled facility will be responsible for providing the patient with their medications at time of release from the facility to include their pain medication, the muscle relaxants, and their blood thinner medication. If the patient is still at the rehab facility at time of the two week follow up appointment, the skilled rehab facility will also need to assist the patient in arranging follow up appointment in our office and any transportation needs.  MAKE SURE YOU:  Understand these instructions.  Get help right away if you are not doing well or get worse.

## 2016-09-24 NOTE — Care Management Important Message (Signed)
Important Message  Patient Details  Name: Alejandra Wiley MRN: [id number] Date of Birth: Apr 17, 1932   Medicare Important Message Given:  N/A - LOS <3 / Initial given by admissions    Jolly Mango, RN 09/24/2016, 1:20 PM

## 2016-09-24 NOTE — Care Management Note (Signed)
Case Management Note  Patient Details  Name: Alejandra Wiley MRN: [id number] Date of Birth: 11-Oct-1932  Subjective/Objective:  RNCM consult for discharge planning. PT recommending SNF.Patient to go to [facility] today                  Action/Plan:   Expected Discharge Date:  09/24/16               Expected Discharge Plan:  [place]  In-House Referral:  Clinical Social Work  Discharge planning Services  CM Consult  Post Acute Care Choice:    Choice offered to:     DME Arranged:    DME Agency:     HH Arranged:    [place] Agency:     Status of Service:  Completed, signed off  If discussed at [employer], dates discussed:    Additional Comments:  Jolly Mango, RN 09/24/2016, 11:39 AM

## 2016-09-24 NOTE — Progress Notes (Signed)
   Subjective: 1 Day Post-Op Procedure(s) (LRB): TOTAL KNEE REVISION-POLYETHYLENE EXCHANGE (Right) Patient reports pain as mild.   Patient is well, and has had no acute complaints or problems Denies any CP, SOB, ABD pain. We will continue therapy today.  Plan is to go Skilled nursing facility after hospital stay.  Objective: Vital signs in last 24 hours: Temp:  [96.6 F (35.9 C)-98 F (36.7 C)] 98 F (36.7 C) (09/07 0400) Pulse Rate:  [59-77] 59 (09/07 0400) Resp:  [12-26] 19 (09/07 0400) BP: (93-200)/(39-114) 111/44 (09/07 0400) SpO2:  [93 %-100 %] 97 % (09/07 0400) Weight:  [55.3 kg (122 lb)-56.2 kg (124 lb)] 55.3 kg (122 lb) (09/06 1815)  Intake/Output from previous day: 09/06 0701 - 09/07 0700 In: 1582.5 [I.V.:1382.5; IV Piggyback:200] Out: 165 [Urine:750; Blood:25] Intake/Output this shift: No intake/output data recorded.   Recent Labs  09/24/16 0523  HGB 10.0*    Recent Labs  09/24/16 0523  WBC 8.3  RBC 3.27*  HCT 29.3*  PLT 189    Recent Labs  09/24/16 0523  NA 135  K 3.9  CL 105  CO2 23  BUN 17  CREATININE 0.64  GLUCOSE 115*  CALCIUM 7.8*    Recent Labs  09/23/16 1321  INR 1.22    EXAM General - Patient is Alert, Appropriate and Oriented Extremity - Neurovascular intact Sensation intact distally Intact pulses distally Dorsiflexion/Plantar flexion intact No cellulitis present Compartment soft Dressing - dressing C/D/I, no drainage and wound vac intact Motor Function - intact, moving foot and toes well on exam.   Past Medical History:  Diagnosis Date  . Arrhythmia    A-Fib  . Bradycardia   . 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 Pacemaker 12/06/2003   Medtronic Impulse DTDR01  . Pacemaker 2015  . Sick sinus syndrome ([place])   . Sinoatrial node dysfunction ([facility])   . Vaginal intraepithelial neoplasia     Assessment/Plan:   1 Day Post-Op Procedure(s) (LRB): TOTAL KNEE REVISION-POLYETHYLENE EXCHANGE  (Right) Active Problems:   Painful total knee replacement ([facility])  Estimated body mass index is 19.69 kg/m as calculated from the following:   Height as of this encounter: 5\' 6"  (1.676 m).   Weight as of this encounter: 55.3 kg (122 lb). Advance diet Up with therapy  Discharge to SNF today, CM to assist with discharge   DVT Prophylaxis - Coumadin, Foot Pumps and TED hose Weight-Bearing as tolerated to Right leg   T. Rachelle Hora, PA-C [place] 09/24/2016, 8:08 AM

## 2016-09-24 NOTE — NC FL2 (Signed)
[place] LEVEL OF CARE SCREENING TOOL     IDENTIFICATION  Patient Name: GEORGIANNE GRITZ Birthdate: 08-09-1932 Sex: female Admission Date (Current Location): 09/23/2016  [place] and [place] Number:  Engineering geologist and Address:  [facility], [address] [place], [place] [address]      Provider Number: [id number]  Attending Physician Name and Address:  Hessie Knows, MD  Relative Name and Phone Number:       Current Level of Care: Hospital Recommended Level of Care: [place] Prior Approval Number:    Date Approved/Denied:   PASRR Number:  ([id number] )  Discharge Plan: SNF    Current Diagnoses: Patient Active Problem List   Diagnosis Date Noted  . Painful total knee replacement ([place]) 09/23/2016  . 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 ATRIAL FIBRILLATION 06/13/2008  . BRADYCARDIA 06/13/2008  . PACEMAKER-[employer] 06/13/2008    Orientation RESPIRATION BLADDER Height & Weight     Self, Time, Situation, Place  Normal Incontinent Weight: 122 lb (55.3 kg) Height:  5\' 6"  (167.6 cm)  BEHAVIORAL SYMPTOMS/MOOD NEUROLOGICAL BOWEL NUTRITION STATUS      Continent Diet (Diet: Heart Healthy )  AMBULATORY STATUS COMMUNICATION OF NEEDS Skin   Limited Assist Verbally Surgical wounds, Wound Vac (Incision: Right Knee. Provena wound vac. )                       Personal Care Assistance Level of Assistance  Bathing, Feeding, Dressing Bathing Assistance: Limited assistance Feeding assistance: Independent Dressing Assistance: Limited assistance     Functional Limitations Info  Sight, Hearing, Speech Sight Info: Adequate Hearing Info: Adequate Speech Info: Adequate    SPECIAL CARE FACTORS FREQUENCY   PT   5                  Contractures      Additional Factors Info  Code Status, Allergies, Isolation Precautions Code Status Info:  (Full Code. ) Allergies Info:  (Latex, Tape, Xarelto Rivaroxaban,  Nickel)     Isolation Precautions Info:  (MRSA Nasal Swab )     Current Medications (09/24/2016):  This is the current hospital active medication list Current Facility-Administered Medications  Medication Dose Route Frequency Provider Last Rate Last Dose  . 0.9 %  sodium chloride infusion   Intravenous Continuous Hessie Knows, MD 75 mL/hr at 09/23/16 1910    . acetaminophen (TYLENOL) tablet 650 mg  650 mg Oral Q6H PRN Hessie Knows, MD       Or  . acetaminophen (TYLENOL) suppository 650 mg  650 mg Rectal Q6H PRN Hessie Knows, MD      . flecainide [facility]) tablet 75 mg  75 mg Oral BID Hessie Knows, MD   75 mg at 09/23/16 2220  . fluticasone (FLONASE) 50 MCG/ACT nasal spray 2 spray  2 spray Each Nare Daily Hessie Knows, MD      . menthol-cetylpyridinium (CEPACOL) lozenge 3 mg  1 lozenge Oral PRN Hessie Knows, MD       Or  . phenol (CHLORASEPTIC) mouth spray 1 spray  1 spray Mouth/Throat PRN Hessie Knows, MD      . metoCLOPramide (REGLAN) tablet 5-10 mg  5-10 mg Oral Q8H PRN Hessie Knows, MD       Or  . metoCLOPramide (REGLAN) injection 5-10 mg  5-10 mg Intravenous Q8H PRN Hessie Knows, MD      . morphine 2 MG/ML injection 2 mg  2 mg Intravenous Q2H PRN  Hessie Knows, MD      . multivitamin with minerals tablet   Oral Daily Hessie Knows, MD      . nadolol (CORGARD) tablet 20 mg  20 mg Oral Daily Hessie Knows, MD      . ondansetron [facility]) tablet 4 mg  4 mg Oral Q6H PRN Hessie Knows, MD       Or  . ondansetron [facility]) injection 4 mg  4 mg Intravenous Q6H PRN Hessie Knows, MD      . oxyCODONE (Oxy IR/ROXICODONE) immediate release tablet 5-10 mg  5-10 mg Oral Q3H PRN Hessie Knows, MD   10 mg at 09/24/16 0149  . warfarin (COUMADIN) tablet 4 mg  4 mg Oral q1800 Hessie Knows, MD   4 mg at 09/23/16 2220  . Warfarin - Physician Dosing Inpatient   Does not apply q1800 Hessie Knows, MD         Discharge Medications: Please see discharge summary for a list of discharge  medications.  Relevant Imaging Results:  Relevant Lab Results:   Additional Information  (SSN: [id number])  Marckus Hanover, Veronia Beets, LCSW

## 2016-09-24 NOTE — Clinical Social Work Placement (Signed)
   CLINICAL SOCIAL WORK PLACEMENT  NOTE  Date:  09/24/2016  Patient Details  Name: Alejandra Wiley MRN: [id number] Date of Birth: January 10, 1933  Clinical Social Work is seeking post-discharge placement for this patient at the [place] level of care (*CSW will initial, date and re-position this form in  chart as items are completed):  Yes   Patient/family provided with [place] Work Department's list of facilities offering this level of care within the geographic area requested by the patient (or if unable, by the patient's family).  Yes   Patient/family informed of their freedom to choose among providers that offer the needed level of care, that participate in Medicare, Medicaid or managed care program needed by the patient, have an available bed and are willing to accept the patient.  Yes   Patient/family informed of [place]'s ownership interest in [facility] and [facility], as well as of the fact that they are under no obligation to receive care at these facilities.  PASRR submitted to EDS on       PASRR number received on       Existing PASRR number confirmed on 09/24/16     FL2 transmitted to all facilities in geographic area requested by pt/family on 09/24/16     FL2 transmitted to all facilities within larger geographic area on       Patient informed that his/her managed care company has contracts with or will negotiate with certain facilities, including the following:        Yes   Patient/family informed of bed offers received.  Patient chooses bed at  [facility] )     Physician recommends and patient chooses bed at      Patient to be transferred to  [facility] ) on 09/24/16.  Patient to be transferred to facility by  (Patient's friend/ neighbor will provide transport. )     Patient family notified on 09/24/16 of transfer.  Name of family member notified:   (Patient's husband is at [facility] and patient will be joining  him there. )     PHYSICIAN       Additional Comment:    _______________________________________________ Deforrest Bogle, Veronia Beets, LCSW 09/24/2016, 12:42 PM

## 2016-09-24 NOTE — Progress Notes (Signed)
PT Cancellation Note  Patient Details Name: Alejandra Wiley MRN: [id number] DOB: 01-01-1933   Cancelled Treatment:    Reason Eval/Treat Not Completed:  (Evaluation attempted.  Patient currently eating breakfast (just started); requests therapist return at later time.  will continue efforts at later time this AM.)   [name], PT, DPT, NCS 09/24/16, 9:22 AM [phone]

## 2016-09-24 NOTE — Clinical Social Work Note (Signed)
Clinical Social Work Assessment  Patient Details  Name: Alejandra Wiley MRN: [id number] Date of Birth: [birth year]  Date of referral:  09/24/16               Reason for consult:  Facility Placement                Permission sought to share information with:  Chartered certified accountant granted to share information::  Yes, Verbal Permission Granted  Name::      Building services engineer::     Relationship::     Contact Information:     Housing/Transportation Living arrangements for the past 2 months:  [place] of Information:  Patient, Facility Patient Interpreter Needed:  None Criminal Activity/Legal Involvement Pertinent to Current Situation/Hospitalization:  No - Comment as needed Significant Relationships:  Spouse Lives with:  Spouse Do you feel safe going back to the place where you live?  Yes Need for family participation in patient care:  Yes (Comment)  Care giving concerns:  Patient is an independent living resident at [place].    Social Worker assessment / plan:  Holiday representative (CSW) received verbal consult from Dr. Rudene Christians that patient has made arrangements to go to [facility]. CSW contacted [facility] at [facility]. Per Seth Bake patient's husband Alejandra Wiley is at [facility] now and they are expecting for patient to come today. Per Seth Bake she will start [place] authorization however patient understands that [facility] Rossetti will likely deny SNF based on her progress with PT. Patient understands that she will likely have to pay privately to stay at [facility].  Patient is medically stable for D/C to [facility] today. Per Seth Bake admissions coordinator at [facility] patient can come today to room 203-B. Seth Bake started [place] authorization. RN will call report at [phone]. Patient's neighbor will provide transport. Patient is aware of above. Please reconsult if future social work needs  arise. CSW signing off.   Employment status:  Retired Nurse, adult PT Recommendations:   ([place] and follow up therapy as arranged by Psychologist, sport and exercise) Information / Referral to community resources:  [place]  Patient/Family's Response to care:  Patient is agreeable to D/C to [facility] today and understands that she will likely have to pay out of pocket for [facility].   Patient/Family's Understanding of and Emotional Response to Diagnosis, Current Treatment, and Prognosis:  Patient was very pleasant and thanked CSW for assistance.   Emotional Assessment Appearance:  Appears stated age Attitude/Demeanor/Rapport:    Affect (typically observed):  Accepting, Adaptable, Pleasant Orientation:  Oriented to Self, Oriented to Place, Oriented to  Time, Oriented to Situation Alcohol / Substance use:  Not Applicable Psych involvement (Current and /or in the community):  No (Comment)  Discharge Needs  Concerns to be addressed:  No discharge needs identified Readmission within the last 30 days:  No Current discharge risk:  None Barriers to Discharge:  No Barriers Identified   Dorsie Burich, Veronia Beets, [place] 09/24/2016, 12:44 PM

## 2016-09-24 NOTE — Evaluation (Signed)
Physical Therapy Evaluation Patient Details Name: Alejandra Wiley MRN: [id number] DOB: [birth year] Today's Date: 09/24/2016   History of Present Illness  admitted for acute hospitalization status post R total knee revision (09/23/16), WBAT.  Clinical Impression  Upon evaluation, patient alert and oriented; follows all commands, demonstrates excellent insight, safety awareness and participation with all functional activities.  R knee strength (3-/5) and ROM (6-78 degrees) grossly WFL for basic transfers and mobility; however, very guarded (especially with flexion activities) due to pain.  Extended time, frequent rest periods required with all therex/functional activities.  Demonstrates ability to complete sit/stand, basic transfers and gait (44') with RW, cga/min assist.  Single L lateral LOB with initial gait efforts (min assist from therapist for correction), but no additional buckling or LOB noted.  Overall mechanics and comfort/confidence with gait performance improving throughout distances.  Anticipate continued progress towards all mobility goals, but will require increased time/encouragement for activities requiring R knee flexion. Would benefit from skilled PT to address above deficits and promote optimal return to PLOF; patient to benefit from continued therapy and discharge plan as determined by physician.    Follow Up Recommendations DC plan and follow up therapy as arranged by surgeon    Equipment Recommendations  Rolling walker with 5" wheels;3in1 (PT)    Recommendations for Other Services       Precautions / Restrictions Precautions Precautions: Fall Restrictions Weight Bearing Restrictions: Yes RLE Weight Bearing: Weight bearing as tolerated      Mobility  Bed Mobility               General bed mobility comments: seated in chair beginning of session, with RN for toileting end of session  Transfers Overall transfer level: Needs assistance Equipment used: Rolling  walker (2 wheeled) Transfers: Sit to/from Stand Sit to Stand: Min guard         General transfer comment: cuing for hand placement, maintains R LE anterior to BOS with very limited active use of R LE with movement transition  Ambulation/Gait Ambulation/Gait assistance: Min guard Ambulation Distance (Feet): 45 Feet Assistive device: Rolling walker (2 wheeled)       General Gait Details: 3-point step to gait pattern with fair/good R LE WBing and control (no buckling).  Single L LE LOB during initial gait efforts (min assist for correction), otherwise, cga throughout gait trial.  Slow and guarded, but improving throughout distances.  Stairs            Wheelchair Mobility    Modified Rankin (Stroke Patients Only)       Balance Overall balance assessment: Needs assistance Sitting-balance support: No upper extremity supported;Feet supported Sitting balance-Leahy Scale: Good     Standing balance support: Bilateral upper extremity supported Standing balance-Leahy Scale: Fair                               Pertinent Vitals/Pain Pain Assessment: Faces Faces Pain Scale: Hurts even more Pain Location: R knee Pain Descriptors / Indicators: Aching;Grimacing;Guarding Pain Intervention(s): Limited activity within patient's tolerance;Monitored during session;Premedicated before session;Repositioned    Home Living Family/patient expects to be discharged to:: Skilled nursing facility                 Additional Comments: Patient is resident of indep living at [facility]; single level home with no steps required.  Serves as primary caregiver for husband (dementia), requiring total assist for cognitive tasks, physical assist for all mobility.  Husband currently at health care center during patient's recovery.    Prior Function Level of Independence: Independent         Comments: Indep with ADLs, household and community mobilization without assist device; does  endorse single fall within previous year (resulting in R hip fracture)     Hand Dominance   Dominant Hand: Right    Extremity/Trunk Assessment   Upper Extremity Assessment Upper Extremity Assessment: Overall WFL for tasks assessed    Lower Extremity Assessment Lower Extremity Assessment: Generalized weakness (R knee grossly 3-/5, limited by pain; otherwise, strength and sensation grossly WFL)       Communication   Communication: No difficulties  Cognition Arousal/Alertness: Awake/alert Behavior During Therapy: WFL for tasks assessed/performed Overall Cognitive Status: Within Functional Limits for tasks assessed                                        General Comments      Exercises Total Joint Exercises Goniometric ROM: R knee 6-78 degrees (after flexion stretching); very guarded against flexion activities Other Exercises Other Exercises: Seated R LE therex, AROM, for muscular strength/endurance: ankle pumps, quad sets, LAQs, knee flexion.  Increased time/encouragement to complete all isolated therex; patient very guarded against all flexion activities. Other Exercises: Toilet transfer, ambulatory with RW, cga.  Stand to sit to standard toilet height, cga/min assist with grab bar-R LE significantly anterior to BOS with movement transition.  RN in room to complete toileting and assist patient with return to bed.   Assessment/Plan    PT Assessment Patient needs continued PT services  PT Problem List Decreased strength;Decreased range of motion;Decreased activity tolerance;Decreased balance;Decreased mobility;Decreased coordination;Decreased cognition;Decreased knowledge of use of DME;Decreased safety awareness;Pain;Decreased skin integrity       PT Treatment Interventions DME instruction;Gait training;Functional mobility training;Stair training;Therapeutic activities;Balance training;Therapeutic exercise;Patient/family education    PT Goals (Current goals can  be found in the Care Plan section)  Acute Rehab PT Goals Patient Stated Goal: to discharge to healthcare center for continued therapy PT Goal Formulation: With patient Time For Goal Achievement: 10/08/16 Potential to Achieve Goals: Good    Frequency BID   Barriers to discharge Decreased caregiver support      Co-evaluation               AM-PAC PT "6 Clicks" Daily Activity  Outcome Measure Difficulty turning over in bed (including adjusting bedclothes, sheets and blankets)?: A Little Difficulty moving from lying on back to sitting on the side of the bed? : A Little Difficulty sitting down on and standing up from a chair with arms (e.g., wheelchair, bedside commode, etc,.)?: Unable Help needed moving to and from a bed to chair (including a wheelchair)?: A Little Help needed walking in hospital room?: A Little Help needed climbing 3-5 steps with a railing? : A Little 6 Click Score: 16    End of Session Equipment Utilized During Treatment: Gait belt Activity Tolerance: Patient tolerated treatment well Patient left:  (RN in room to assist/complete toileting with patient) Nurse Communication: Mobility status PT Visit Diagnosis: Muscle weakness (generalized) (M62.81);Difficulty in walking, not elsewhere classified (R26.2);Pain Pain - Right/Left: Right Pain - part of body: Knee    Time: 4010-2725 PT Time Calculation (min) (ACUTE ONLY): 48 min   Charges:   PT Evaluation $PT Eval Moderate Complexity: 1 Mod PT Treatments $Therapeutic Exercise: 8-22 mins $Therapeutic Activity: 8-22 mins  PT G Codes:   PT G-Codes **NOT FOR INPATIENT CLASS** Functional Assessment Tool Used: AM-PAC 6 Clicks Basic Mobility Functional Limitation: Mobility: Walking and moving around Mobility: Walking and Moving Around Current Status (J1552): At least 20 percent but less than 40 percent impaired, limited or restricted Mobility: Walking and Moving Around Goal Status [phone]): At least 1 percent but  less than 20 percent impaired, limited or restricted    Elyas Villamor H. Owens Shark, PT, DPT, NCS 09/24/16, 11:37 AM [phone]

## 2016-09-28 DIAGNOSIS — I495 Sick sinus syndrome: Secondary | ICD-10-CM | POA: Diagnosis not present

## 2016-09-28 DIAGNOSIS — F430 Acute stress reaction: Secondary | ICD-10-CM | POA: Diagnosis not present

## 2016-09-28 DIAGNOSIS — M1711 Unilateral primary osteoarthritis, right knee: Secondary | ICD-10-CM | POA: Diagnosis not present

## 2016-09-28 DIAGNOSIS — I480 Paroxysmal atrial fibrillation: Secondary | ICD-10-CM | POA: Diagnosis not present

## 2016-10-04 NOTE — Anesthesia Postprocedure Evaluation (Signed)
Anesthesia Post Note  Patient: [name]  Procedure(s) Performed: Procedure(s) (LRB): TOTAL KNEE REVISION-POLYETHYLENE EXCHANGE (Right)  Patient location during evaluation: PACU Anesthesia Type: General Level of consciousness: awake and alert Pain management: pain level controlled Vital Signs Assessment: post-procedure vital signs reviewed and stable Respiratory status: spontaneous breathing, nonlabored ventilation, respiratory function stable and patient connected to nasal cannula oxygen Cardiovascular status: blood pressure returned to baseline and stable Postop Assessment: no apparent nausea or vomiting Anesthetic complications: no     Last Vitals:  Vitals:   09/24/16 1004 09/24/16 1327  BP: 128/63 (!) 122/42  Pulse: 64 61  Resp: 18   Temp: 36.7 C 36.9 C  SpO2: 100% 99%    Last Pain:  Vitals:   09/24/16 1327  TempSrc: Oral  PainSc:                  [name]

## 2016-10-11 ENCOUNTER — Other Ambulatory Visit: Payer: Self-pay | Admitting: *Deleted

## 2016-10-11 NOTE — Patient Outreach (Signed)
[facility]) Care Management  10/11/2016  Alejandra Wiley 1932-12-06 [id number]  [employer] Referral-discharged from [facility] 0/19/2018:  Total knee revision   Admission to hospital 9/6-09/24/2016 Transferred to SNF for rehab at [facility] 9/7-9/19/2018  Telephone call to patient who was advised of reason for call & [facility] care management services.  HIPPA verification received.   Patient voices that she  had knee surgery and was transferred to skilled facility for rehabilitation. States she is currently home in independent living home at [facility]. States she is currently receiving home health physical therapy. Voices that someone is currently staying with her.  Voices that she has all of her medications.   States she does not need [facility] services at this time.  Consents to receive [facility] contact information.  Plan: Send [facility] contact information to patient. Send to care management assistant to close out   Sherrin Daisy, RN BSN [place] Management Coordinator [facility] Care Management  [phone]

## 2016-10-12 DIAGNOSIS — M25561 Pain in right knee: Secondary | ICD-10-CM | POA: Diagnosis not present

## 2016-10-12 DIAGNOSIS — Z96651 Presence of right artificial knee joint: Secondary | ICD-10-CM | POA: Diagnosis not present

## 2016-10-13 ENCOUNTER — Encounter: Payer: Self-pay | Admitting: *Deleted

## 2016-10-14 ENCOUNTER — Telehealth: Payer: Self-pay | Admitting: Cardiology

## 2016-10-14 ENCOUNTER — Encounter: Payer: Medicare HMO | Admitting: Internal Medicine

## 2016-10-14 ENCOUNTER — Encounter: Payer: Medicare HMO | Admitting: *Deleted

## 2016-10-14 NOTE — Telephone Encounter (Signed)
Spoke with pt and reminded pt of remote transmission that is due today. Pt verbalized understanding.   

## 2016-10-15 ENCOUNTER — Telehealth: Payer: Self-pay | Admitting: Internal Medicine

## 2016-10-15 ENCOUNTER — Encounter: Payer: Self-pay | Admitting: Cardiology

## 2016-10-15 NOTE — Telephone Encounter (Signed)
Patient states that she will be receiving a new monitor from [facility] within the next 2 weeks. She wasn't sure if she needed to r/s her upcoming appt with SK since she wouldn't be able to send a remote prior to the appt. I told her that her ppm will be checked at her upcoming appt and a new remote date will be given. Patient verbalized understanding.

## 2016-10-15 NOTE — Telephone Encounter (Signed)
Pt states she was unable to sent a report before her appt with Dr. Caryl Comes on 10/2. Please call.

## 2016-10-18 DIAGNOSIS — M25561 Pain in right knee: Secondary | ICD-10-CM | POA: Diagnosis not present

## 2016-10-18 DIAGNOSIS — Z96651 Presence of right artificial knee joint: Secondary | ICD-10-CM | POA: Diagnosis not present

## 2016-10-19 ENCOUNTER — Ambulatory Visit (INDEPENDENT_AMBULATORY_CARE_PROVIDER_SITE_OTHER): Payer: Medicare HMO | Admitting: Internal Medicine

## 2016-10-19 ENCOUNTER — Encounter: Payer: Self-pay | Admitting: Internal Medicine

## 2016-10-19 VITALS — BP 150/88 | HR 77 | Ht 66.0 in | Wt 123.8 lb

## 2016-10-19 DIAGNOSIS — I4891 Unspecified atrial fibrillation: Secondary | ICD-10-CM | POA: Diagnosis not present

## 2016-10-19 DIAGNOSIS — Z950 Presence of cardiac pacemaker: Secondary | ICD-10-CM

## 2016-10-19 DIAGNOSIS — I442 Atrioventricular block, complete: Secondary | ICD-10-CM

## 2016-10-19 LAB — CUP PACEART INCLINIC DEVICE CHECK
Battery Voltage: 2.79 V
Brady Statistic AP VS Percent: 3 %
Brady Statistic AS VS Percent: 7 %
Date Time Interrogation Session: 20181002155237
Implantable Lead Implant Date: 20051130
Implantable Lead Implant Date: 20051130
Implantable Lead Location: 753860
Lead Channel Impedance Value: 581 Ohm
Lead Channel Impedance Value: 657 Ohm
Lead Channel Pacing Threshold Amplitude: 0.75 V
Lead Channel Pacing Threshold Pulse Width: 0.4 ms
Lead Channel Setting Pacing Amplitude: 2.5 V
Lead Channel Setting Pacing Pulse Width: 0.4 ms
MDC IDC LEAD LOCATION: 753859
MDC IDC MSMT BATTERY IMPEDANCE: 331 Ohm
MDC IDC MSMT BATTERY REMAINING LONGEVITY: 93 mo
MDC IDC MSMT LEADCHNL RA PACING THRESHOLD PULSEWIDTH: 0.4 ms
MDC IDC MSMT LEADCHNL RA SENSING INTR AMPL: 0.7 mV
MDC IDC MSMT LEADCHNL RV PACING THRESHOLD AMPLITUDE: 1 V
MDC IDC MSMT LEADCHNL RV SENSING INTR AMPL: 11.2 mV
MDC IDC PG IMPLANT DT: 20140901
MDC IDC SET LEADCHNL RV PACING AMPLITUDE: 2.5 V
MDC IDC SET LEADCHNL RV SENSING SENSITIVITY: 4 mV
MDC IDC STAT BRADY AP VP PERCENT: 78 %
MDC IDC STAT BRADY AS VP PERCENT: 12 %

## 2016-10-19 NOTE — Progress Notes (Signed)
Patient Care Team: Kirk Ruths, MD as PCP - General (Unknown Physician Specialty)   HPI  Alejandra Wiley is a 83 y.o. female seen in followup for palpitations associated with atrial arrhythmias as well as bradycardia requiring backup bradycardia pacing. She takes flecainide   Her husband is still at home  The patient denies chest pain, shortness of breath, nocturnal dyspnea, orthopnea or peripheral edema.  There have been no palpitations, lightheadedness or syncope.       Echocardiogram 2103 demonstrated normal left ventricular function  Date Cr Hgb  8/18 0.67 12.2  9/18 0.64 10     Past Medical History:  Diagnosis Date  . Arrhythmia    A-Fib  . Bradycardia   . 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 Pacemaker 12/06/2003   Medtronic Impulse DTDR01  . Pacemaker 2015  . Sick sinus syndrome ([place])   . Sinoatrial node dysfunction (HCC)   . Vaginal intraepithelial neoplasia     Past Surgical History:  Procedure Laterality Date  . APPENDECTOMY    . HERNIA REPAIR     lower abd  . IMAGE GUIDED SINUS SURGERY  2015  . INSERT / REPLACE / REMOVE PACEMAKER  12/06/2003   Medtronic impulse DTDR01  . JOINT REPLACEMENT    . LEEP  1999  . ORIF HIP FRACTURE Right 02/2016  . PACEMAKER GENERATOR CHANGE N/A 09/18/2012   Procedure: PACEMAKER GENERATOR CHANGE;  Surgeon: Deboraha Sprang, MD;  [facility] CATH LAB;  Service: Cardiovascular;  Laterality: N/A;  . TONSILLECTOMY    . TOTAL KNEE ARTHROPLASTY    . TOTAL KNEE REVISION Right 09/23/2016   Procedure: TOTAL KNEE REVISION-POLYETHYLENE EXCHANGE;  Surgeon: Hessie Knows, MD;  Location: [facility] ORS;  Service: Orthopedics;  Laterality: Right;  Marland Kitchen VAGINAL HYSTERECTOMY      Current Outpatient Prescriptions  Medication Sig Dispense Refill  . acetaminophen (TYLENOL) 500 MG tablet Take 500 mg by mouth every 8 (eight) hours as needed for mild pain or headache.    . diphenhydrAMINE (BENADRYL) 25 mg capsule Take 25  mg by mouth every 8 (eight) hours as needed. Takes along with Tylenol    . flecainide (TAMBOCOR) 50 MG tablet Take 1.5 tablets (75 mg total) by mouth 2 (two) times daily. 270 tablet 3  . fluticasone (FLONASE) 50 MCG/ACT nasal spray Place 2 sprays into both nostrils daily.    . Glucosamine HCl 1500 MG TABS Take 1,500 mg by mouth daily.    Marland Kitchen ibandronate (BONIVA) 150 MG tablet Take 150 mg by mouth every 30 (thirty) days. Take in the morning with a full glass of water, on an empty stomach, and do not take anything else by mouth or lie down for the next 30 min.    . Multiple Vitamin (MULTI VITAMIN DAILY PO) Take 1 tablet by mouth daily. Centrum silver    . nadolol (CORGARD) 20 MG tablet Take 20 mg by mouth daily.     Marland Kitchen OVER THE COUNTER MEDICATION Place 1 spray into the nose daily. NeilMed Sinus Rinse    . oxyCODONE (OXY IR/ROXICODONE) 5 MG immediate release tablet Take 1-2 tablets (5-10 mg total) by mouth every 4 (four) hours as needed for breakthrough pain. 30 tablet 0  . warfarin (COUMADIN) 1 MG tablet Take 2 mg by mouth 2 (two) times daily. Takes 2 tablets in the morning and 2 in the evening     No current facility-administered medications for this visit.  Allergies  Allergen Reactions  . Latex Other (See Comments)    blisters  . Tape Other (See Comments)    Blisters/ paper tape is Ok  . Xarelto [Rivaroxaban] Other (See Comments)    Gums bleeding and too much bruising  . Nickel Rash    Review of Systems negative except from HPI and PMH  Physical Exam BP (!) 150/88 (BP Location: Left Arm, Patient Position: Sitting, Cuff Size: Normal)   Pulse 77   Ht 5\' 6"  (1.676 m)   Wt 123 lb 12 oz (56.1 kg)   BMI 19.97 kg/m  Well developed and nourished in no acute distress HENT normal Neck supple with JVP-flat Clear Regular rate and rhythm, no murmurs or gallops Abd-soft with active BS No Clubbing cyanosis edema Skin-warm and dry A & Oriented  Grossly normal sensory and motor  function   ECG AV pacing with long AV delay   Assessment and  Plan \ Atrial arrhtyhmia/Fib  Sinus node dysfunction  Hypertension  Pacemaker  Medtronic  The patient's device was interrogated.  The information was reviewed. No changes were made in the programming.    Doing pretty well  Stress with husband  Euvolemic continue current meds  No intercurrent atrial fibrillation or flutter   On Anticoagulation;  No bleeding issues

## 2016-10-19 NOTE — Patient Instructions (Signed)
Medication Instructions: - Your physician recommends that you continue on your current medications as directed. Please refer to the Current Medication list given to you today.  Labwork: - none ordered  Procedures/Testing: - none ordered  Follow-Up: - Remote monitoring is used to monitor your Pacemaker of ICD from home. This monitoring reduces the number of office visits required to check your device to one time per year. It allows [place] to keep an eye on the functioning of your device to ensure it is working properly. You are scheduled for a device check from home on 01/19/17. You may send your transmission at any time that day. If you have a wireless device, the transmission will be sent automatically. After your physician reviews your transmission, you will receive a postcard with your next transmission date.  - Your physician wants you to follow-up in: 1 year with Dr. Caryl Comes. You will receive a reminder letter in the mail two months in advance. If you don't receive a letter, please call our office to schedule the follow-up appointment.   Any Additional Special Instructions Will Be Listed Below (If Applicable).     If you need a refill on your cardiac medications before your next appointment, please call your pharmacy.

## 2016-10-20 DIAGNOSIS — M25561 Pain in right knee: Secondary | ICD-10-CM | POA: Diagnosis not present

## 2016-10-20 DIAGNOSIS — Z96651 Presence of right artificial knee joint: Secondary | ICD-10-CM | POA: Diagnosis not present

## 2016-10-21 DIAGNOSIS — Z7901 Long term (current) use of anticoagulants: Secondary | ICD-10-CM | POA: Diagnosis not present

## 2016-10-22 DIAGNOSIS — Z96651 Presence of right artificial knee joint: Secondary | ICD-10-CM | POA: Diagnosis not present

## 2016-10-22 DIAGNOSIS — M25561 Pain in right knee: Secondary | ICD-10-CM | POA: Diagnosis not present

## 2016-10-27 DIAGNOSIS — M25561 Pain in right knee: Secondary | ICD-10-CM | POA: Diagnosis not present

## 2016-10-27 DIAGNOSIS — Z96651 Presence of right artificial knee joint: Secondary | ICD-10-CM | POA: Diagnosis not present

## 2016-10-29 DIAGNOSIS — M25561 Pain in right knee: Secondary | ICD-10-CM | POA: Diagnosis not present

## 2016-10-29 DIAGNOSIS — Z96651 Presence of right artificial knee joint: Secondary | ICD-10-CM | POA: Diagnosis not present

## 2016-11-01 DIAGNOSIS — M25561 Pain in right knee: Secondary | ICD-10-CM | POA: Diagnosis not present

## 2016-11-01 DIAGNOSIS — Z96651 Presence of right artificial knee joint: Secondary | ICD-10-CM | POA: Diagnosis not present

## 2016-11-03 DIAGNOSIS — Z96651 Presence of right artificial knee joint: Secondary | ICD-10-CM | POA: Diagnosis not present

## 2016-11-05 DIAGNOSIS — Z96651 Presence of right artificial knee joint: Secondary | ICD-10-CM | POA: Diagnosis not present

## 2016-11-05 DIAGNOSIS — M25561 Pain in right knee: Secondary | ICD-10-CM | POA: Diagnosis not present

## 2016-11-15 DIAGNOSIS — M25561 Pain in right knee: Secondary | ICD-10-CM | POA: Diagnosis not present

## 2016-11-15 DIAGNOSIS — Z96651 Presence of right artificial knee joint: Secondary | ICD-10-CM | POA: Diagnosis not present

## 2016-11-19 DIAGNOSIS — M25561 Pain in right knee: Secondary | ICD-10-CM | POA: Diagnosis not present

## 2016-11-19 DIAGNOSIS — Z96651 Presence of right artificial knee joint: Secondary | ICD-10-CM | POA: Diagnosis not present

## 2016-11-22 DIAGNOSIS — I480 Paroxysmal atrial fibrillation: Secondary | ICD-10-CM | POA: Diagnosis not present

## 2016-11-23 DIAGNOSIS — Z96651 Presence of right artificial knee joint: Secondary | ICD-10-CM | POA: Diagnosis not present

## 2016-11-23 DIAGNOSIS — M25561 Pain in right knee: Secondary | ICD-10-CM | POA: Diagnosis not present

## 2016-11-29 DIAGNOSIS — J320 Chronic maxillary sinusitis: Secondary | ICD-10-CM | POA: Diagnosis not present

## 2016-11-29 DIAGNOSIS — R0982 Postnasal drip: Secondary | ICD-10-CM | POA: Diagnosis not present

## 2016-11-30 DIAGNOSIS — Z96651 Presence of right artificial knee joint: Secondary | ICD-10-CM | POA: Diagnosis not present

## 2016-11-30 DIAGNOSIS — M25561 Pain in right knee: Secondary | ICD-10-CM | POA: Diagnosis not present

## 2016-12-03 DIAGNOSIS — Z96651 Presence of right artificial knee joint: Secondary | ICD-10-CM | POA: Diagnosis not present

## 2016-12-03 DIAGNOSIS — M25561 Pain in right knee: Secondary | ICD-10-CM | POA: Diagnosis not present

## 2016-12-22 DIAGNOSIS — I480 Paroxysmal atrial fibrillation: Secondary | ICD-10-CM | POA: Diagnosis not present

## 2016-12-22 DIAGNOSIS — R51 Headache: Secondary | ICD-10-CM | POA: Diagnosis not present

## 2016-12-22 DIAGNOSIS — I10 Essential (primary) hypertension: Secondary | ICD-10-CM | POA: Diagnosis not present

## 2016-12-30 DIAGNOSIS — M720 Palmar fascial fibromatosis [Dupuytren]: Secondary | ICD-10-CM | POA: Diagnosis not present

## 2016-12-30 DIAGNOSIS — M79644 Pain in right finger(s): Secondary | ICD-10-CM | POA: Diagnosis not present

## 2017-01-19 ENCOUNTER — Ambulatory Visit (INDEPENDENT_AMBULATORY_CARE_PROVIDER_SITE_OTHER): Payer: Medicare HMO | Admitting: *Deleted

## 2017-01-19 ENCOUNTER — Telehealth: Payer: Self-pay | Admitting: Cardiology

## 2017-01-19 DIAGNOSIS — I442 Atrioventricular block, complete: Secondary | ICD-10-CM

## 2017-01-19 DIAGNOSIS — Z96651 Presence of right artificial knee joint: Secondary | ICD-10-CM | POA: Diagnosis not present

## 2017-01-19 DIAGNOSIS — M1712 Unilateral primary osteoarthritis, left knee: Secondary | ICD-10-CM | POA: Diagnosis not present

## 2017-01-19 NOTE — Telephone Encounter (Signed)
LMOVM reminding pt to send remote transmission.   

## 2017-01-21 ENCOUNTER — Encounter: Payer: Self-pay | Admitting: Cardiology

## 2017-01-21 NOTE — Progress Notes (Signed)
Remote pacemaker transmission.   

## 2017-01-21 NOTE — Telephone Encounter (Signed)
Spoke with patient and assisted in walking her through how to send a remote transmission.

## 2017-01-21 NOTE — Telephone Encounter (Signed)
Patient having trouble transmitting .  Please call to discuss .

## 2017-01-24 LAB — CUP PACEART REMOTE DEVICE CHECK
Battery Voltage: 2.79 V
Brady Statistic AS VS Percent: 3 %
Date Time Interrogation Session: 20190104155823
Implantable Lead Location: 753859
Implantable Pulse Generator Implant Date: 20140901
Lead Channel Pacing Threshold Amplitude: 0.875 V
Lead Channel Pacing Threshold Pulse Width: 0.4 ms
Lead Channel Setting Pacing Amplitude: 2 V
Lead Channel Setting Pacing Pulse Width: 0.4 ms
Lead Channel Setting Sensing Sensitivity: 4 mV
MDC IDC LEAD IMPLANT DT: 20051130
MDC IDC LEAD IMPLANT DT: 20051130
MDC IDC LEAD LOCATION: 753860
MDC IDC MSMT BATTERY IMPEDANCE: 381 Ohm
MDC IDC MSMT BATTERY REMAINING LONGEVITY: 89 mo
MDC IDC MSMT LEADCHNL RA IMPEDANCE VALUE: 580 Ohm
MDC IDC MSMT LEADCHNL RA PACING THRESHOLD PULSEWIDTH: 0.4 ms
MDC IDC MSMT LEADCHNL RV IMPEDANCE VALUE: 622 Ohm
MDC IDC MSMT LEADCHNL RV PACING THRESHOLD AMPLITUDE: 1 V
MDC IDC SET LEADCHNL RV PACING AMPLITUDE: 2.5 V
MDC IDC STAT BRADY AP VP PERCENT: 85 %
MDC IDC STAT BRADY AP VS PERCENT: 2 %
MDC IDC STAT BRADY AS VP PERCENT: 10 %

## 2017-01-25 DIAGNOSIS — I480 Paroxysmal atrial fibrillation: Secondary | ICD-10-CM | POA: Diagnosis not present

## 2017-03-06 ENCOUNTER — Other Ambulatory Visit: Payer: Self-pay | Admitting: Internal Medicine

## 2017-03-11 DIAGNOSIS — I480 Paroxysmal atrial fibrillation: Secondary | ICD-10-CM | POA: Diagnosis not present

## 2017-03-21 DIAGNOSIS — H16223 Keratoconjunctivitis sicca, not specified as Sjogren's, bilateral: Secondary | ICD-10-CM | POA: Diagnosis not present

## 2017-03-21 DIAGNOSIS — H2513 Age-related nuclear cataract, bilateral: Secondary | ICD-10-CM | POA: Diagnosis not present

## 2017-04-13 DIAGNOSIS — I480 Paroxysmal atrial fibrillation: Secondary | ICD-10-CM | POA: Diagnosis not present

## 2017-04-13 DIAGNOSIS — Z96651 Presence of right artificial knee joint: Secondary | ICD-10-CM | POA: Diagnosis not present

## 2017-04-13 DIAGNOSIS — M2550 Pain in unspecified joint: Secondary | ICD-10-CM | POA: Diagnosis not present

## 2017-04-20 ENCOUNTER — Telehealth: Payer: Self-pay | Admitting: Cardiology

## 2017-04-20 ENCOUNTER — Ambulatory Visit (INDEPENDENT_AMBULATORY_CARE_PROVIDER_SITE_OTHER): Payer: Medicare HMO | Admitting: *Deleted

## 2017-04-20 DIAGNOSIS — I442 Atrioventricular block, complete: Secondary | ICD-10-CM

## 2017-04-20 NOTE — Telephone Encounter (Signed)
Spoke with pt and reminded pt of remote transmission that is due today. Pt verbalized understanding.   

## 2017-04-21 ENCOUNTER — Encounter: Payer: Self-pay | Admitting: Cardiology

## 2017-04-21 NOTE — Progress Notes (Signed)
Remote pacemaker transmission.   

## 2017-05-03 LAB — CUP PACEART REMOTE DEVICE CHECK
Battery Impedance: 430 Ohm
Battery Remaining Longevity: 85 mo
Brady Statistic AP VP Percent: 85 %
Brady Statistic AS VS Percent: 2 %
Implantable Lead Implant Date: 20051130
Implantable Lead Implant Date: 20051130
Implantable Lead Location: 753860
Implantable Lead Model: 5076
Implantable Pulse Generator Implant Date: 20140901
Lead Channel Impedance Value: 580 Ohm
Lead Channel Impedance Value: 590 Ohm
Lead Channel Pacing Threshold Amplitude: 0.75 V
Lead Channel Setting Pacing Amplitude: 2 V
Lead Channel Setting Pacing Amplitude: 2.5 V
Lead Channel Setting Sensing Sensitivity: 4 mV
MDC IDC LEAD LOCATION: 753859
MDC IDC MSMT BATTERY VOLTAGE: 2.79 V
MDC IDC MSMT LEADCHNL RA PACING THRESHOLD PULSEWIDTH: 0.4 ms
MDC IDC MSMT LEADCHNL RV PACING THRESHOLD AMPLITUDE: 1 V
MDC IDC MSMT LEADCHNL RV PACING THRESHOLD PULSEWIDTH: 0.4 ms
MDC IDC SESS DTM: 20190404135238
MDC IDC SET LEADCHNL RV PACING PULSEWIDTH: 0.4 ms
MDC IDC STAT BRADY AP VS PERCENT: 2 %
MDC IDC STAT BRADY AS VP PERCENT: 11 %

## 2017-05-11 DIAGNOSIS — R05 Cough: Secondary | ICD-10-CM | POA: Diagnosis not present

## 2017-05-11 DIAGNOSIS — J40 Bronchitis, not specified as acute or chronic: Secondary | ICD-10-CM | POA: Diagnosis not present

## 2017-05-11 DIAGNOSIS — I480 Paroxysmal atrial fibrillation: Secondary | ICD-10-CM | POA: Diagnosis not present

## 2017-05-16 DIAGNOSIS — I480 Paroxysmal atrial fibrillation: Secondary | ICD-10-CM | POA: Diagnosis not present

## 2017-05-23 DIAGNOSIS — R791 Abnormal coagulation profile: Secondary | ICD-10-CM | POA: Diagnosis not present

## 2017-05-25 DIAGNOSIS — M810 Age-related osteoporosis without current pathological fracture: Secondary | ICD-10-CM | POA: Diagnosis not present

## 2017-05-25 DIAGNOSIS — R05 Cough: Secondary | ICD-10-CM | POA: Diagnosis not present

## 2017-05-25 DIAGNOSIS — R5383 Other fatigue: Secondary | ICD-10-CM | POA: Diagnosis not present

## 2017-05-25 DIAGNOSIS — I10 Essential (primary) hypertension: Secondary | ICD-10-CM | POA: Diagnosis not present

## 2017-05-25 DIAGNOSIS — I480 Paroxysmal atrial fibrillation: Secondary | ICD-10-CM | POA: Diagnosis not present

## 2017-05-25 DIAGNOSIS — E7800 Pure hypercholesterolemia, unspecified: Secondary | ICD-10-CM | POA: Diagnosis not present

## 2017-05-27 ENCOUNTER — Other Ambulatory Visit: Payer: Self-pay | Admitting: Internal Medicine

## 2017-05-27 DIAGNOSIS — R9389 Abnormal findings on diagnostic imaging of other specified body structures: Secondary | ICD-10-CM

## 2017-05-27 DIAGNOSIS — M439 Deforming dorsopathy, unspecified: Secondary | ICD-10-CM

## 2017-05-30 DIAGNOSIS — J320 Chronic maxillary sinusitis: Secondary | ICD-10-CM | POA: Diagnosis not present

## 2017-05-30 DIAGNOSIS — R0982 Postnasal drip: Secondary | ICD-10-CM | POA: Diagnosis not present

## 2017-05-31 ENCOUNTER — Other Ambulatory Visit: Payer: Self-pay | Admitting: Internal Medicine

## 2017-05-31 DIAGNOSIS — R9389 Abnormal findings on diagnostic imaging of other specified body structures: Secondary | ICD-10-CM

## 2017-05-31 DIAGNOSIS — M439 Deforming dorsopathy, unspecified: Secondary | ICD-10-CM

## 2017-06-06 DIAGNOSIS — R791 Abnormal coagulation profile: Secondary | ICD-10-CM | POA: Diagnosis not present

## 2017-06-10 ENCOUNTER — Encounter
Admission: RE | Admit: 2017-06-10 | Discharge: 2017-06-10 | Disposition: A | Discharge status: Home / Self Care | Payer: Medicare HMO | Source: Ambulatory Visit | Attending: Internal Medicine | Admitting: Internal Medicine

## 2017-06-10 DIAGNOSIS — Z471 Aftercare following joint replacement surgery: Secondary | ICD-10-CM | POA: Diagnosis not present

## 2017-06-10 DIAGNOSIS — M439 Deforming dorsopathy, unspecified: Secondary | ICD-10-CM | POA: Diagnosis not present

## 2017-06-10 DIAGNOSIS — R9389 Abnormal findings on diagnostic imaging of other specified body structures: Secondary | ICD-10-CM | POA: Insufficient documentation

## 2017-06-10 DIAGNOSIS — Z96641 Presence of right artificial hip joint: Secondary | ICD-10-CM | POA: Diagnosis not present

## 2017-06-10 MED ORDER — TECHNETIUM TC 99M MEDRONATE IV KIT
22.7600 | PACK | Freq: Once | INTRAVENOUS | Status: AC | PRN
  Administered 2017-06-10: 22.76 via INTRAVENOUS

## 2017-06-15 DIAGNOSIS — M4854XA Collapsed vertebra, not elsewhere classified, thoracic region, initial encounter for fracture: Secondary | ICD-10-CM | POA: Diagnosis not present

## 2017-06-23 ENCOUNTER — Other Ambulatory Visit: Payer: Self-pay

## 2017-06-23 ENCOUNTER — Ambulatory Visit: Payer: Medicare HMO | Admitting: Anesthesiology

## 2017-06-23 ENCOUNTER — Ambulatory Visit: Payer: Medicare HMO

## 2017-06-23 ENCOUNTER — Encounter: Payer: Self-pay | Admitting: *Deleted

## 2017-06-23 ENCOUNTER — Observation Stay
Admission: RE | Admit: 2017-06-23 | Discharge: 2017-06-24 | Disposition: A | Discharge status: Home / Self Care | Payer: Medicare HMO | Source: Ambulatory Visit | Attending: Internal Medicine | Admitting: Internal Medicine

## 2017-06-23 ENCOUNTER — Encounter
Admission: RE | Disposition: A | Discharge status: Home / Self Care | Payer: Self-pay | Source: Ambulatory Visit | Attending: Internal Medicine

## 2017-06-23 DIAGNOSIS — J810 Acute pulmonary edema: Secondary | ICD-10-CM

## 2017-06-23 DIAGNOSIS — M6281 Muscle weakness (generalized): Secondary | ICD-10-CM | POA: Diagnosis not present

## 2017-06-23 DIAGNOSIS — J811 Chronic pulmonary edema: Secondary | ICD-10-CM

## 2017-06-23 DIAGNOSIS — I9589 Other hypotension: Secondary | ICD-10-CM | POA: Diagnosis not present

## 2017-06-23 DIAGNOSIS — Z7901 Long term (current) use of anticoagulants: Secondary | ICD-10-CM | POA: Insufficient documentation

## 2017-06-23 DIAGNOSIS — R000 Tachycardia, unspecified: Secondary | ICD-10-CM | POA: Diagnosis not present

## 2017-06-23 DIAGNOSIS — Z981 Arthrodesis status: Secondary | ICD-10-CM | POA: Diagnosis not present

## 2017-06-23 DIAGNOSIS — M4854XA Collapsed vertebra, not elsewhere classified, thoracic region, initial encounter for fracture: Secondary | ICD-10-CM | POA: Diagnosis not present

## 2017-06-23 DIAGNOSIS — M4856XA Collapsed vertebra, not elsewhere classified, lumbar region, initial encounter for fracture: Secondary | ICD-10-CM | POA: Diagnosis not present

## 2017-06-23 DIAGNOSIS — I480 Paroxysmal atrial fibrillation: Secondary | ICD-10-CM | POA: Insufficient documentation

## 2017-06-23 DIAGNOSIS — Z79899 Other long term (current) drug therapy: Secondary | ICD-10-CM | POA: Diagnosis not present

## 2017-06-23 DIAGNOSIS — I472 Ventricular tachycardia: Secondary | ICD-10-CM | POA: Diagnosis not present

## 2017-06-23 DIAGNOSIS — R918 Other nonspecific abnormal finding of lung field: Secondary | ICD-10-CM | POA: Diagnosis not present

## 2017-06-23 DIAGNOSIS — I4891 Unspecified atrial fibrillation: Secondary | ICD-10-CM | POA: Diagnosis not present

## 2017-06-23 DIAGNOSIS — R0902 Hypoxemia: Secondary | ICD-10-CM | POA: Diagnosis not present

## 2017-06-23 DIAGNOSIS — Z419 Encounter for procedure for purposes other than remedying health state, unspecified: Secondary | ICD-10-CM

## 2017-06-23 DIAGNOSIS — I471 Supraventricular tachycardia: Secondary | ICD-10-CM | POA: Diagnosis not present

## 2017-06-23 HISTORY — DX: Paroxysmal atrial fibrillation: I48.0

## 2017-06-23 HISTORY — PX: KYPHOPLASTY: SHX5884

## 2017-06-23 LAB — BASIC METABOLIC PANEL
ANION GAP: 9 (ref 5–15)
BUN: 17 mg/dL (ref 6–20)
CALCIUM: 8.4 mg/dL — AB (ref 8.9–10.3)
CHLORIDE: 98 mmol/L — AB (ref 101–111)
CO2: 26 mmol/L (ref 22–32)
CREATININE: 0.73 mg/dL (ref 0.44–1.00)
GFR calc Af Amer: >60 mL/min (ref >60)
GFR calc non Af Amer: >60 mL/min (ref >60)
GLUCOSE: 186 mg/dL — AB (ref 65–99)
Potassium: 4.1 mmol/L (ref 3.5–5.1)
Sodium: 133 mmol/L — ABNORMAL LOW (ref 135–145)

## 2017-06-23 LAB — CBC
HEMATOCRIT: 33.9 % — AB (ref 35.0–47.0)
HEMOGLOBIN: 11.4 g/dL — AB (ref 12.0–16.0)
MCH: 30.8 pg (ref 26.0–34.0)
MCHC: 33.6 g/dL (ref 32.0–36.0)
MCV: 91.5 fL (ref 80.0–100.0)
Platelets: 242 10*3/uL (ref 150–440)
RBC: 3.7 MIL/uL — ABNORMAL LOW (ref 3.80–5.20)
RDW: 14.2 % (ref 11.5–14.5)
WBC: 12.8 10*3/uL — ABNORMAL HIGH (ref 3.6–11.0)

## 2017-06-23 LAB — PROTIME-INR
INR: 1.16
PROTHROMBIN TIME: 14.7 s (ref 11.4–15.2)

## 2017-06-23 LAB — SURGICAL PCR SCREEN
MRSA, PCR: POSITIVE — AB
Staphylococcus aureus: POSITIVE — AB

## 2017-06-23 SURGERY — KYPHOPLASTY
Anesthesia: General | Wound class: Clean

## 2017-06-23 MED ORDER — IOPAMIDOL (ISOVUE-M 200) INJECTION 41%
INTRAMUSCULAR | Status: AC
  Filled 2017-06-23: qty 20

## 2017-06-23 MED ORDER — PHENYLEPHRINE HCL 10 MG/ML IJ SOLN
50.0000 ug | Freq: Once | INTRAMUSCULAR | Status: AC
  Administered 2017-06-23: 50 ug via INTRAVENOUS

## 2017-06-23 MED ORDER — SODIUM CHLORIDE 0.9 % IV SOLN: INTRAVENOUS | Status: DC

## 2017-06-23 MED ORDER — SODIUM CHLORIDE 0.9 % IJ SOLN
INTRAMUSCULAR | Status: AC
  Filled 2017-06-23: qty 10

## 2017-06-23 MED ORDER — ONDANSETRON HCL 4 MG/2ML IJ SOLN: 4.0000 mg | Freq: Four times a day (QID) | INTRAMUSCULAR | Status: DC | PRN

## 2017-06-23 MED ORDER — FLUTICASONE PROPIONATE 50 MCG/ACT NA SUSP
2.0000 | Freq: Every day | NASAL | Status: DC
  Administered 2017-06-24: 2 via NASAL
  Filled 2017-06-23: qty 16

## 2017-06-23 MED ORDER — KETAMINE HCL 50 MG/ML IJ SOLN
INTRAMUSCULAR | Status: DC | PRN
  Administered 2017-06-23: 25 mg via INTRAMUSCULAR

## 2017-06-23 MED ORDER — BUPIVACAINE-EPINEPHRINE (PF) 0.5% -1:200000 IJ SOLN
INTRAMUSCULAR | Status: DC | PRN
  Administered 2017-06-23: 10 mL via PERINEURAL

## 2017-06-23 MED ORDER — CEFAZOLIN SODIUM-DEXTROSE 1-4 GM/50ML-% IV SOLN
INTRAVENOUS | Status: DC | PRN
  Administered 2017-06-23: 1 g via INTRAVENOUS

## 2017-06-23 MED ORDER — ACETAMINOPHEN 325 MG PO TABS: 325.0000 mg | ORAL_TABLET | ORAL | Status: DC | PRN

## 2017-06-23 MED ORDER — WARFARIN - PHYSICIAN DOSING INPATIENT
Freq: Every day | Status: DC
Start: 2017-06-24 — End: 2017-06-24

## 2017-06-23 MED ORDER — ACETAMINOPHEN 160 MG/5ML PO SOLN
325.0000 mg | ORAL | Status: DC | PRN
  Filled 2017-06-23: qty 20.3

## 2017-06-23 MED ORDER — PROPOFOL 500 MG/50ML IV EMUL
INTRAVENOUS | Status: DC | PRN
  Administered 2017-06-23: 50 ug/kg/min via INTRAVENOUS

## 2017-06-23 MED ORDER — FENTANYL CITRATE (PF) 100 MCG/2ML IJ SOLN: 25.0000 ug | INTRAMUSCULAR | Status: DC | PRN

## 2017-06-23 MED ORDER — DIPHENHYDRAMINE HCL 25 MG PO CAPS
25.0000 mg | ORAL_CAPSULE | Freq: Three times a day (TID) | ORAL | Status: DC | PRN
  Administered 2017-06-23: 25 mg via ORAL
  Filled 2017-06-23: qty 1

## 2017-06-23 MED ORDER — LIDOCAINE HCL (PF) 1 % IJ SOLN
INTRAMUSCULAR | Status: AC
  Filled 2017-06-23: qty 30

## 2017-06-23 MED ORDER — MUPIROCIN 2 % EX OINT
1.0000 "application " | TOPICAL_OINTMENT | Freq: Two times a day (BID) | CUTANEOUS | Status: DC
  Administered 2017-06-23 – 2017-06-24 (×2): 1 via NASAL
  Filled 2017-06-23: qty 22

## 2017-06-23 MED ORDER — LACTATED RINGERS IV SOLN
INTRAVENOUS | Status: DC
  Administered 2017-06-23: 09:00:00 via INTRAVENOUS

## 2017-06-23 MED ORDER — IPRATROPIUM-ALBUTEROL 0.5-2.5 (3) MG/3ML IN SOLN
RESPIRATORY_TRACT | Status: AC
  Filled 2017-06-23: qty 3

## 2017-06-23 MED ORDER — METOCLOPRAMIDE HCL 5 MG PO TABS: 5.0000 mg | ORAL_TABLET | Freq: Three times a day (TID) | ORAL | Status: DC | PRN

## 2017-06-23 MED ORDER — PHENYLEPHRINE HCL 10 MG/ML IJ SOLN
25.0000 ug | Freq: Once | INTRAMUSCULAR | Status: AC
Start: 2017-06-23 — End: 2017-06-23
  Administered 2017-06-23: 25 ug via INTRAVENOUS

## 2017-06-23 MED ORDER — FUROSEMIDE 10 MG/ML IJ SOLN
INTRAMUSCULAR | Status: AC
  Administered 2017-06-23: 40 mg via INTRAVENOUS
  Filled 2017-06-23: qty 4

## 2017-06-23 MED ORDER — MIDAZOLAM HCL 2 MG/2ML IJ SOLN
INTRAMUSCULAR | Status: DC | PRN
  Administered 2017-06-23: 1 mg via INTRAVENOUS

## 2017-06-23 MED ORDER — PROMETHAZINE HCL 25 MG/ML IJ SOLN: 6.2500 mg | INTRAMUSCULAR | Status: DC | PRN

## 2017-06-23 MED ORDER — NADOLOL 20 MG PO TABS
20.0000 mg | ORAL_TABLET | Freq: Every day | ORAL | Status: DC
  Administered 2017-06-24: 20 mg via ORAL
  Filled 2017-06-23: qty 1

## 2017-06-23 MED ORDER — PHENYLEPHRINE HCL 10 MG/ML IJ SOLN
INTRAMUSCULAR | Status: DC | PRN
  Administered 2017-06-23: 200 ug via INTRAVENOUS

## 2017-06-23 MED ORDER — FLECAINIDE ACETATE 50 MG PO TABS
75.0000 mg | ORAL_TABLET | Freq: Two times a day (BID) | ORAL | Status: DC
  Administered 2017-06-23 – 2017-06-24 (×2): 75 mg via ORAL
  Filled 2017-06-23 (×3): qty 2

## 2017-06-23 MED ORDER — ONDANSETRON HCL 4 MG PO TABS: 4.0000 mg | ORAL_TABLET | Freq: Four times a day (QID) | ORAL | Status: DC | PRN

## 2017-06-23 MED ORDER — MIDAZOLAM HCL 2 MG/2ML IJ SOLN
INTRAMUSCULAR | Status: AC
  Filled 2017-06-23: qty 2

## 2017-06-23 MED ORDER — PROPOFOL 10 MG/ML IV BOLUS
INTRAVENOUS | Status: AC
  Filled 2017-06-23: qty 20

## 2017-06-23 MED ORDER — CEFAZOLIN SODIUM-DEXTROSE 1-4 GM/50ML-% IV SOLN
INTRAVENOUS | Status: AC
  Filled 2017-06-23: qty 50

## 2017-06-23 MED ORDER — LIDOCAINE HCL 1 % IJ SOLN
INTRAMUSCULAR | Status: DC | PRN
  Administered 2017-06-23: 20 mL

## 2017-06-23 MED ORDER — HYDROCODONE-ACETAMINOPHEN 5-325 MG PO TABS: 1.0000 | ORAL_TABLET | ORAL | Status: DC | PRN

## 2017-06-23 MED ORDER — ACETAMINOPHEN 500 MG PO TABS
500.0000 mg | ORAL_TABLET | Freq: Three times a day (TID) | ORAL | Status: DC | PRN
  Administered 2017-06-23: 500 mg via ORAL
  Filled 2017-06-23: qty 1

## 2017-06-23 MED ORDER — WARFARIN SODIUM 2 MG PO TABS
2.0000 mg | ORAL_TABLET | Freq: Two times a day (BID) | ORAL | Status: DC
  Administered 2017-06-24: 2 mg via ORAL
  Filled 2017-06-23 (×2): qty 1

## 2017-06-23 MED ORDER — BUPIVACAINE-EPINEPHRINE (PF) 0.5% -1:200000 IJ SOLN
INTRAMUSCULAR | Status: AC
  Filled 2017-06-23: qty 30

## 2017-06-23 MED ORDER — METOCLOPRAMIDE HCL 5 MG/ML IJ SOLN: 5.0000 mg | Freq: Three times a day (TID) | INTRAMUSCULAR | Status: DC | PRN

## 2017-06-23 MED ORDER — CHLORHEXIDINE GLUCONATE CLOTH 2 % EX PADS: 6.0000 | MEDICATED_PAD | Freq: Every day | CUTANEOUS | Status: DC

## 2017-06-23 MED ORDER — FUROSEMIDE 10 MG/ML IJ SOLN
40.0000 mg | Freq: Once | INTRAMUSCULAR | Status: AC
  Administered 2017-06-23: 40 mg via INTRAVENOUS

## 2017-06-23 MED ORDER — KETAMINE HCL 50 MG/ML IJ SOLN
INTRAMUSCULAR | Status: AC
  Filled 2017-06-23: qty 10

## 2017-06-23 MED ORDER — OXYCODONE HCL 5 MG PO TABS: 5.0000 mg | ORAL_TABLET | ORAL | Status: DC | PRN

## 2017-06-23 MED ORDER — IOPAMIDOL (ISOVUE-M 200) INJECTION 41%
INTRAMUSCULAR | Status: DC | PRN
  Administered 2017-06-23: 20 mL

## 2017-06-23 MED ORDER — IPRATROPIUM-ALBUTEROL 0.5-2.5 (3) MG/3ML IN SOLN
3.0000 mL | Freq: Once | RESPIRATORY_TRACT | Status: AC
  Administered 2017-06-23: 3 mL via RESPIRATORY_TRACT

## 2017-06-23 MED ORDER — ADULT MULTIVITAMIN W/MINERALS CH
1.0000 | ORAL_TABLET | Freq: Every day | ORAL | Status: DC
  Administered 2017-06-23 – 2017-06-24 (×2): 1 via ORAL
  Filled 2017-06-23: qty 1

## 2017-06-23 MED ORDER — PHENYLEPHRINE HCL 10 MG/ML IJ SOLN
100.0000 ug | Freq: Once | INTRAMUSCULAR | Status: AC
  Administered 2017-06-23: 100 ug via INTRAVENOUS

## 2017-06-23 MED ORDER — OXYCODONE HCL 5 MG PO TABS: 5.0000 mg | ORAL_TABLET | ORAL | 0 refills | Status: DC | PRN

## 2017-06-23 MED ORDER — HYDROCODONE-ACETAMINOPHEN 7.5-325 MG PO TABS
1.0000 | ORAL_TABLET | Freq: Once | ORAL | Status: DC | PRN
  Filled 2017-06-23: qty 1

## 2017-06-23 SURGICAL SUPPLY — 19 items
ADH SKN CLS APL DERMABOND .7 (GAUZE/BANDAGES/DRESSINGS) ×1
CEMENT KYPHON CX01A KIT/MIXER (Cement) ×2 IMPLANT
DERMABOND ADVANCED (GAUZE/BANDAGES/DRESSINGS) ×1
DERMABOND ADVANCED .7 DNX12 (GAUZE/BANDAGES/DRESSINGS) ×1 IMPLANT
DEVICE BIOPSY BONE KYPH (INSTRUMENTS) ×1 IMPLANT
DEVICE BIOPSY BONE KYPHX (INSTRUMENTS) ×2 IMPLANT
DRAPE C-ARM XRAY 36X54 (DRAPES) ×2 IMPLANT
DURAPREP 26ML APPLICATOR (WOUND CARE) ×2 IMPLANT
GLOVE SURG SYN 9.0  PF PI (GLOVE) ×1
GLOVE SURG SYN 9.0 PF PI (GLOVE) ×1 IMPLANT
GOWN SRG 2XL LVL 4 RGLN SLV (GOWNS) ×1 IMPLANT
GOWN STRL NON-REIN 2XL LVL4 (GOWNS) ×2
GOWN STRL REUS W/ TWL LRG LVL3 (GOWN DISPOSABLE) ×1 IMPLANT
GOWN STRL REUS W/TWL LRG LVL3 (GOWN DISPOSABLE) ×2
PACK KYPHOPLASTY (MISCELLANEOUS) ×2 IMPLANT
STRAP SAFETY 5IN WIDE (MISCELLANEOUS) ×2 IMPLANT
TRAY KYPHOPAK 15/2 EXPRESS (KITS) ×1 IMPLANT
TRAY KYPHOPAK 15/3 EXPRESS 1ST (MISCELLANEOUS) ×1 IMPLANT
TRAY KYPHOPAK 20/3 EXPRESS 1ST (MISCELLANEOUS) ×1 IMPLANT

## 2017-06-23 NOTE — Anesthesia Post-op Follow-up Note (Signed)
Anesthesia QCDR form completed.        

## 2017-06-23 NOTE — Progress Notes (Signed)
Pt doing better  Blood pressure better  Oxygen saturation down when change from simple mask to nasal cannual

## 2017-06-23 NOTE — OR Nursing (Signed)
Dr. Lavone Neri at bedside multiple times was told to push 25 mcq of phenylephrine for patient's low BP.  Then pushed another 50 mcq, BP continued to drop after several minutes we pushed 17mcq of phenylephrine.  Patient is alert and confused asking repetitive questions.  Dr. Rockey Situ at bedside he states that they have contacted the medtronic rep for pacemaker to have the parameters reset.  Dr. Rockey Situ was told by Dr. Cleda Mccreedy to try the pacemaker magnet for 5 seconds.  Patient heart came down to the 80s and has converted to AFIB.  She is now more alert and not as confused as well as perfusing better.  Awaiting medtronic rep now, she is resting BP is 103/60 and now patient is on 4L oxygen via nasal cannula.  Repositioned patient for back pain.  Updated her ride [place], called and spoke with her.

## 2017-06-23 NOTE — Consult Note (Signed)
Cardiology Consultation:   Patient ID: Alejandra Wiley; [id number]; [birth year]   Admit date: 06/23/2017 Date of Consult: 06/23/2017  Primary Care Provider: Kirk Ruths, MD Primary Cardiologist: Gaynelle Cage Electrophysiologist:  Caryl Comes   Patient Profile:   Alejandra Wiley is a 84 y.o. female with a hx of PAF on Coumadin nadolol, and flecainide, and sick sinus syndrome s/p dual chamber MDT PPM in 2005 with generator change out in 2014 who is being seen today for the evaluation of tachycardia at the request of Dr. Lavone Neri, MD.  History of Present Illness:   Alejandra Wiley previously underwent echocardiogram and 2013 that demonstrated normal LV systolic function per prior notes.  Patient underwent PPM implantation in 2005 secondary to symptomatic bradycardia.  Patient was evaluated in 09/2012 and noted to have VVI pacing.  Potassium was 5.9.  Her device was at [facility].  She underwent device generator replacement at that time.  She has been followed by EP for palpitations with atrial arrhythmias including paroxysmal A. fib as well as for management of her Medtronic pacemaker.  She underwent generator change out in 2014.  Most recent device interrogation from 04/2017 showed 182 mode switches, 88 atrial high rate episodes with the longest lasting 58: 43, 4 ventricular high rate episodes with the longest lasting 1: 51.  She was AV paced 85% of the time.  Patient presented for outpatient kyphoplasty on 06/23/2017.  She underwent this procedure without complication.  However, upon being placed back in the supine position it was noted the patient developed a tachycardic heart rate shortly after being placed on her back.  Twelve-lead EKG showed V pacing at a rate of 205 bpm.  On the monitor patient was V paced in the 120s bpm.  Initially, patient's BP was soft in the 60s over 50s requiring phenylephrine.  Cardiology was asked for urgent consult.  Case was discussed between Drs. Rockey Situ and Caryl Comes over the  phone.  At that time, EP recommended urgent device interrogation with reprogramming.  Call was placed to [employer] to page local rep.  Call was also placed to our office for direct contact with local [employer] rep.  EP MD recommended we place a Medtronic magnet over the device for 5 seconds.  Upon doing so, patient's heart rate improved to the 80s to 90s with what appears to be underlying A. fib.  With this, the patient's blood pressure improved into the upper 90s to mid 60s.   Past Medical History:  Diagnosis Date  . Bradycardia   . 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 Pacemaker 12/06/2003   Medtronic Impulse DTDR01  . Pacemaker 2015  . PAF (paroxysmal atrial fibrillation) (HCC)    a. on Coumadin and flecainide; b. CHADS2VASc 3 (HTN, age x 2, female)  . Sick sinus syndrome ([place])   . Sinoatrial node dysfunction (HCC)   . Vaginal intraepithelial neoplasia     Past Surgical History:  Procedure Laterality Date  . APPENDECTOMY    . HERNIA REPAIR     lower abd  . IMAGE GUIDED SINUS SURGERY  2015  . INSERT / REPLACE / REMOVE PACEMAKER  12/06/2003   Medtronic impulse DTDR01  . JOINT REPLACEMENT Right    knee  . LEEP  1999  . ORIF HIP FRACTURE Right 02/2016  . PACEMAKER GENERATOR CHANGE N/A 09/18/2012   Procedure: PACEMAKER GENERATOR CHANGE;  Surgeon: Deboraha Sprang, MD;  Location[facility] CATH LAB;  Service: Cardiovascular;  Laterality: N/A;  .  TONSILLECTOMY    . TOTAL KNEE ARTHROPLASTY    . TOTAL KNEE REVISION Right 09/23/2016   Procedure: TOTAL KNEE REVISION-POLYETHYLENE EXCHANGE;  Surgeon: Hessie Knows, MD;  Location: [facility] ORS;  Service: Orthopedics;  Laterality: Right;  Marland Kitchen VAGINAL HYSTERECTOMY       Home Meds: Prior to Admission medications   Medication Sig Start Date End Date Taking? Authorizing Provider  acetaminophen (TYLENOL) 500 MG tablet Take 500 mg by mouth every 8 (eight) hours as needed for mild pain or headache.   Yes [provider]  amLODipine  (NORVASC) 5 MG tablet Take 5 mg by mouth daily.   Yes [provider]  diphenhydrAMINE (BENADRYL) 25 mg capsule Take 25 mg by mouth every 8 (eight) hours as needed. Takes along with Tylenol   Yes [provider]  flecainide (TAMBOCOR) 50 MG tablet TAKE 1 AND 1/2 TABLETS TWICE DAILY 03/08/17  Yes Deboraha Sprang, MD  fluticasone [facility]) 50 MCG/ACT nasal spray Place 2 sprays into both nostrils daily.   Yes [provider]  nadolol (CORGARD) 20 MG tablet Take 20 mg by mouth daily.    Yes [provider]  Glucosamine HCl 1500 MG TABS Take 1,500 mg by mouth daily.    [provider]  ibandronate (BONIVA) 150 MG tablet Take 150 mg by mouth every 30 (thirty) days. Take in the morning with a full glass of water, on an empty stomach, and do not take anything else by mouth or lie down for the next 30 min.    [provider]  Multiple Vitamin (MULTI VITAMIN DAILY PO) Take 1 tablet by mouth daily. Centrum silver    [provider]  OVER THE COUNTER MEDICATION Place 1 spray into the nose daily. NeilMed Sinus Rinse    [provider]  oxyCODONE (OXY IR/ROXICODONE) 5 MG immediate release tablet Take 1-2 tablets (5-10 mg total) by mouth every 4 (four) hours as needed for breakthrough pain. 09/24/16   Duanne Guess, PA-C  oxyCODONE (ROXICODONE) 5 MG immediate release tablet Take 1 tablet (5 mg total) by mouth every 4 (four) hours as needed for severe pain. 06/23/17   Hessie Knows, MD  warfarin (COUMADIN) 1 MG tablet Take 2 mg by mouth 2 (two) times daily. Takes 2 tablets in the morning and 2 in the evening    [provider]    Inpatient Medications: Scheduled Meds: . ipratropium-albuterol      . sodium chloride       Continuous Infusions: . lactated ringers 50 mL/hr at 06/23/17 0903   PRN Meds: acetaminophen **OR** acetaminophen (TYLENOL) oral liquid 160 mg/5 mL, fentaNYL (SUBLIMAZE) injection, HYDROcodone-acetaminophen,  promethazine  Allergies:   Allergies  Allergen Reactions  . Latex Other (See Comments)    blisters  . Tape Other (See Comments)    Blisters/ paper tape is Ok  . Xarelto [Rivaroxaban] Other (See Comments)    Gums bleeding and too much bruising  . Nickel Rash    Social History:   Social History   Socioeconomic History  . Marital status: Married    Spouse name: Not on file  . Number of children: Not on file  . Years of education: Not on file  . Highest education level: Not on file  Occupational History  . Occupation: Part time  Social Needs  . Financial resource strain: Not on file  . Food insecurity:    Worry: Not on file    Inability: Not on file  . Transportation  needs:    Medical: Not on file    Non-medical: Not on file  Tobacco Use  . Smoking status: Never Smoker  . Smokeless tobacco: Never Used  Substance and Sexual Activity  . Alcohol use: Yes    Alcohol/week: 0.6 oz    Types: 1 Glasses of wine per week    Comment: occasionally  . Drug use: No  . Sexual activity: Not Currently  Lifestyle  . Physical activity:    Days per week: Not on file    Minutes per session: Not on file  . Stress: Not on file  Relationships  . Social connections:    Talks on phone: Not on file    Gets together: Not on file    Attends religious service: Not on file    Active member of club or organization: Not on file    Attends meetings of clubs or organizations: Not on file    Relationship status: Not on file  . Intimate partner violence:    Fear of current or ex partner: Not on file    Emotionally abused: Not on file    Physically abused: Not on file    Forced sexual activity: Not on file  Other Topics Concern  . Not on file  Social History Narrative   Pt gets regular exercise.     Family History:   Family History  Problem Relation Age of Onset  . Stroke Mother   . Heart disease Father   . Diabetes Daughter   . Diabetes Son     ROS:  Review of Systems  Unable to  perform ROS: Acuity of condition      Physical Exam/Data:   Vitals:   06/23/17 1340 06/23/17 1350 06/23/17 1400 06/23/17 1410  BP: (!) 78/61 98/65 103/60 (!) 99/49  Pulse: (!) 121 92 83 86  Resp: (!) 25 20 19  (!) 23  Temp:      TempSrc:      SpO2: 95% 92% 97% 94%  Weight:        Intake/Output Summary (Last 24 hours) at 06/23/2017 1444 Last data filed at 06/23/2017 1135 Gross per 24 hour  Intake 600 ml  Output 5 ml  Net 595 ml   Filed Weights   06/23/17 0756  Weight: 124 lb 8 oz (56.5 kg)   Body mass index is 20.09 kg/m.   Physical Exam: General: Well developed, well nourished, in no acute distress. Head: Normocephalic, atraumatic, sclera non-icteric, no xanthomas, nares without discharge.  Neck: Negative for carotid bruits. JVD not elevated. Lungs: Clear bilaterally to auscultation without wheezes, rales, or rhonchi. Breathing is unlabored. Heart: Tachycardic, with S1 S2. No murmurs, rubs, or gallops appreciated. Abdomen: Soft, non-tender, non-distended with normoactive bowel sounds. No hepatomegaly. No rebound/guarding. No obvious abdominal masses. Msk:  Strength and tone appear normal for age. Extremities: No clubbing or cyanosis. No edema. Distal pedal pulses are 2+ and equal bilaterally. Neuro: Alert and oriented X 3. No facial asymmetry. No focal deficit. Moves all extremities spontaneously. Psych:  Responds to questions appropriately with a normal affect.   EKG:  The EKG was personally reviewed and demonstrates: V paced, 205 bpm Telemetry:  Telemetry was personally reviewed and demonstrates: V paced, 122 bpm followed by narrow complex irregular rhythm in the 80s to 90s bpm  Weights: Filed Weights   06/23/17 0756  Weight: 124 lb 8 oz (56.5 kg)    Relevant CV Studies: None available for review in epic  Laboratory Data:  ChemistryNo results  for input(s): NA, K, CL, CO2, GLUCOSE, BUN, CREATININE, CALCIUM, GFRNONAA, GFRAA, ANIONGAP in the last 168 hours.  No  results for input(s): PROT, ALBUMIN, AST, ALT, ALKPHOS, BILITOT in the last 168 hours. HematologyNo results for input(s): WBC, RBC, HGB, HCT, MCV, MCH, MCHC, RDW, PLT in the last 168 hours. Cardiac EnzymesNo results for input(s): TROPONINI in the last 168 hours. No results for input(s): TROPIPOC in the last 168 hours.  BNPNo results for input(s): BNP, PROBNP in the last 168 hours.  DDimer No results for input(s): DDIMER in the last 168 hours.  Radiology/Studies:  Dg Thoracic Spine 2 View  Result Date: 06/23/2017 IMPRESSION: Single level spinal augmentation procedure in the thoracic spine, though unable to accurately assign a segment level; recommend correlation with surgical report. Electronically Signed   By: Lavonia Dana M.D.   On: 06/23/2017 11:54   Dg Chest Port 1 View  Result Date: 06/23/2017 IMPRESSION: Diffuse interstitial and patchy/nodular opacities bilaterally. Imaging findings likely related to pulmonary edema. Close follow-up recommended to ensure resolution as infection or metastatic disease could have this appearance. Electronically Signed   By: Misty Stanley M.D.   On: 06/23/2017 13:14   Dg C-arm 1-60 Min  Result Date: 06/23/2017 IMPRESSION: Single level spinal augmentation procedure in the thoracic spine, though unable to accurately assign a segment level; recommend correlation with surgical report. Electronically Signed   By: Lavonia Dana M.D.   On: 06/23/2017 11:54    Assessment and Plan:   1.  Abnormal EKG/tachycardia: -Case discussed with EP who recommends urgent device interrogation and reprogramming -Patient appears to have had a similar issue with her prior generator in 2014 when it was ERI however most recent device interrogation from 04/2017 shows estimated battery life at 7 years -After discussion with EP a Medtronic magnet was placed over the device with subsequent improvement and ventricular rate to the 80s to 90s bpm with what appears to be underlying A. Fib -Awaiting  Med[employer] rep to come interrogate device and reprogram -Further recs pending once we hear from [employer] and EP -Blood pressure significantly improved following improved heart rate -If patient is going to remain admitted overnight, a transthoracic echocardiogram is probably not unreasonable -Recommend checking BMP, CBC, magnesium, and TSH with repletion of electrolytes as indicated  2.  PAF: -Recommend obtaining twelve-lead EKG -Continue flecainide, nadolol, and Coumadin -Restart Coumadin when felt to be safe from a surgical standpoint   For questions or updates, please contact [place] Please consult [email or web address] for contact info under Cardiology/STEMI.   Signed, Christell Faith, PA-C [place] Pager: [phone] 06/23/2017, 2:44 PM

## 2017-06-23 NOTE — OR Nursing (Signed)
Hospitalist at bedside wrote for 40 mg of lasix now and is currently putting in admission orders

## 2017-06-23 NOTE — Progress Notes (Signed)
Dr Lavone Neri in to check patient  Blood pressure 90/74 sat on 9 liters  Simple mask   Heart rate 120  EKG and chest xray ordered

## 2017-06-23 NOTE — Anesthesia Procedure Notes (Signed)
Date/Time: 06/23/2017 11:03 AM Performed by: Nelda Marseille, CRNA Pre-anesthesia Checklist: Patient identified, Emergency Drugs available, Suction available, Patient being monitored and Timeout performed Oxygen Delivery Method: Nasal cannula

## 2017-06-23 NOTE — Op Note (Signed)
06/23/2017  11:25 AM  PATIENT:  Alejandra Wiley  84 y.o. female  PRE-OPERATIVE DIAGNOSIS:  non traumatic compression fracture of ninth thoracic vetebra  POST-OPERATIVE DIAGNOSIS:  non traumatic compression fracture of ninth thoracic vetebra  PROCEDURE:  Procedure(s): KYPHOPLASTY-T9 (N/A)  SURGEON: Laurene Footman, MD  ASSISTANTS: None  ANESTHESIA:   local and MAC  EBL:  No intake/output data recorded.  BLOOD ADMINISTERED:none  DRAINS: none   LOCAL MEDICATIONS USED:  MARCAINE    and XYLOCAINE   SPECIMEN:  Source of Specimen:  T9 vertebral body  DISPOSITION OF SPECIMEN:  PATHOLOGY  COUNTS:  YES  TOURNIQUET:  * No tourniquets in log *  IMPLANTS: Bone cement  DICTATION: .Dragon Dictation patient was brought to the operating room once adequate sedation was given she was placed prone.  Good visualization of T9 both AP and lateral projections was obtained with a very compressed vertebrae.  After patient identification and timeout procedure completed the skin was prepped with alcohol 10 cc of Xylocaine was infiltrated on the left side T9.  The back was then prepped and draped you sterile fashion and repeat timeout procedure carried out.  Spinal needle was used to get local anesthetic down to the pedicle on the left side with a 50-50 mix of 1% Xylocaine half percent Sensorcaine with epinephrine.  Next after allowing this to set a small incision was made and a trocar was advanced in an extrapedicular fashion into the vertebral body when biopsy was obtained.  Drilling was carried out and balloon placed but minimal inflation secondary to high pressure.  When the cement was mixed in the appropriate consistency approximately 2 cc of bone cement was used to infiltrate into the vertebral body with good fill from left to right sides superior to inferior endplate with a small amount of extravasation into the T8-T9 disc space.  After this was set the trochars removed and the skin closed with Dermabond  followed by Band-Aid  PLAN OF CARE: Discharge to home after PACU  PATIENT DISPOSITION:  PACU - hemodynamically stable.

## 2017-06-23 NOTE — OR Nursing (Signed)
Took patient over from [facility], changed patient and her bed, EKG, chest xray completed, Dr. Rockey Situ at bedside now

## 2017-06-23 NOTE — OR Nursing (Signed)
Called the hospitalist's to have patient admitted, orders from Dr. Lavone Neri to monitor patient's oxygen levels and heart rate.  Called Dor[place] patient's neighbor and she was supposed to pick patient up.  [place] and notified patient's husband and the community that she would be spending the night in the hospital.  Then called her daughter POA [name] Pod in [place] and updated her.  Patient is resting comfortable and now waiting on the hospitalist to come and admit her.

## 2017-06-23 NOTE — Transfer of Care (Signed)
Immediate Anesthesia Transfer of Care Note  Patient: Alejandra Wiley  Procedure(s) Performed: Deatra Canter (N/A )  Patient Location: PACU  Anesthesia Type:General  Level of Consciousness: sedated  Airway & Oxygen Therapy: Patient Spontanous Breathing and Patient connected to face mask oxygen  Post-op Assessment: Report given to RN and Post -op Vital signs reviewed and stable  Post vital signs: Reviewed and stable  Last Vitals:  Vitals Value Taken Time  BP    Temp    Pulse 62 06/23/2017 11:38 AM  Resp    SpO2 96 % 06/23/2017 11:38 AM  Vitals shown include unvalidated device data.  Last Pain:  Vitals:   06/23/17 0756  TempSrc: Oral  PainSc: 0-No pain         Complications: No apparent anesthesia complications

## 2017-06-23 NOTE — Progress Notes (Signed)
Duo neb given for congested chest cough

## 2017-06-23 NOTE — Discharge Instructions (Signed)
Get easy today and tomorrow and resume more normal activities on Saturday.  Okay to remove Band-Aid on Saturday and shower.  Pain medicine as directed.  Resume warfarin today

## 2017-06-23 NOTE — Progress Notes (Signed)
Pt confused  Dr Lavone Neri called multiple times for blood pressure and oxygen level   Called for possible chest xray   No new orders at present

## 2017-06-23 NOTE — H&P (Signed)
[place] [facility] [place] NAME: Alejandra Wiley    MR#:  [id number]  DATE OF BIRTH:  05-03-1932  DATE OF ADMISSION:  06/23/2017  PRIMARY CARE PHYSICIAN: Kirk Ruths, MD   REQUESTING/REFERRING PHYSICIAN: Dr. Hessie Knows  CHIEF COMPLAINT:  No chief complaint on file.   HISTORY OF PRESENT ILLNESS:  Alejandra Wiley  is a 84 y.o. female with a known history of permanent A. fib status post pacemaker for sick sinus syndrome, on Coumadin, history of syncope, arthritis was having a kyphoplasty for a T9 compression fracture.  Postprocedure, when patient was being turned from prone to supine position, she started having wide-complex tachycardia and hypoxia. Medical consult was requested and patient is being being admitted under observation. Patient has been doing well, ambulating well up until her recent T9 compression fracture which seems from osteoporosis and lifting and taking care of her husband at home.  Denies any fall.  Her Coumadin was held for the past 3 days and she underwent the procedure today.  While turning into supine position after the procedure, she became hypoxic which could have triggered her tachycardia.  Appreciate cardiology consult, magnet was placed on her pacemaker and she is in A. fib with heart rate in the 90s.  She required Ventimask and now weaned down to 2 L oxygen.  Chest x-ray showing pulmonary edema.  PAST MEDICAL HISTORY:   Past Medical History:  Diagnosis Date  . Bradycardia   . 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 Pacemaker 12/06/2003   Medtronic Impulse DTDR01  . Pacemaker 2015  . PAF (paroxysmal atrial fibrillation) (HCC)    a. on Coumadin and flecainide; b. CHADS2VASc 3 (HTN, age x 2, female)  . Sick sinus syndrome ([place])   . Sinoatrial node dysfunction (HCC)   . Vaginal intraepithelial neoplasia     PAST SURGICAL HISTORY:   Past Surgical History:  Procedure Laterality Date  .  APPENDECTOMY    . HERNIA REPAIR     lower abd  . IMAGE GUIDED SINUS SURGERY  2015  . INSERT / REPLACE / REMOVE PACEMAKER  12/06/2003   Medtronic impulse DTDR01  . JOINT REPLACEMENT Right    knee  . LEEP  1999  . ORIF HIP FRACTURE Right 02/2016  . PACEMAKER GENERATOR CHANGE N/A 09/18/2012   Procedure: PACEMAKER GENERATOR CHANGE;  Surgeon: Deboraha Sprang, MD;  Location[facility] CATH LAB;  Service: Cardiovascular;  Laterality: N/A;  . TONSILLECTOMY    . TOTAL KNEE ARTHROPLASTY    . TOTAL KNEE REVISION Right 09/23/2016   Procedure: TOTAL KNEE REVISION-POLYETHYLENE EXCHANGE;  Surgeon: Hessie Knows, MD;  Location: [facility] ORS;  Service: Orthopedics;  Laterality: Right;  Marland Kitchen VAGINAL HYSTERECTOMY      SOCIAL HISTORY:   Social History   Tobacco Use  . Smoking status: Never Smoker  . Smokeless tobacco: Never Used  Substance Use Topics  . Alcohol use: Yes    Alcohol/week: 0.6 oz    Types: 1 Glasses of wine per week    Comment: occasionally    FAMILY HISTORY:   Family History  Problem Relation Age of Onset  . Stroke Mother   . Heart disease Father   . Diabetes Daughter   . Diabetes Son     DRUG ALLERGIES:   Allergies  Allergen Reactions  . Latex Other (See Comments)    blisters  . Tape Other (See Comments)    Blisters/ paper tape is Ok  .  Xarelto [Rivaroxaban] Other (See Comments)    Gums bleeding and too much bruising  . Nickel Rash    REVIEW OF SYSTEMS:   Review of Systems  Constitutional: Positive for malaise/fatigue. Negative for chills, fever and weight loss.  HENT: Positive for hearing loss. Negative for ear discharge, ear pain and nosebleeds.   Eyes: Negative for blurred vision, double vision and photophobia.  Respiratory: Positive for shortness of breath. Negative for cough, hemoptysis and wheezing.   Cardiovascular: Positive for palpitations. Negative for chest pain, orthopnea and leg swelling.  Gastrointestinal: Negative for abdominal pain, constipation, diarrhea,  heartburn, melena, nausea and vomiting.  Genitourinary: Negative for dysuria, frequency and urgency.  Musculoskeletal: Positive for back pain and myalgias. Negative for neck pain.  Skin: Negative for rash.  Neurological: Negative for dizziness, tingling, sensory change, speech change, focal weakness and headaches.  Endo/Heme/Allergies: Does not bruise/bleed easily.  Psychiatric/Behavioral: Negative for depression.    MEDICATIONS AT HOME:   Prior to Admission medications   Medication Sig Start Date End Date Taking? Authorizing Provider  acetaminophen (TYLENOL) 500 MG tablet Take 500 mg by mouth every 8 (eight) hours as needed for mild pain or headache.   Yes [provider]  amLODipine (NORVASC) 5 MG tablet Take 5 mg by mouth daily.   Yes [provider]  diphenhydrAMINE (BENADRYL) 25 mg capsule Take 25 mg by mouth every 8 (eight) hours as needed. Takes along with Tylenol   Yes [provider]  flecainide (TAMBOCOR) 50 MG tablet TAKE 1 AND 1/2 TABLETS TWICE DAILY 03/08/17  Yes Deboraha Sprang, MD  fluticasone [facility]) 50 MCG/ACT nasal spray Place 2 sprays into both nostrils daily.   Yes [provider]  nadolol (CORGARD) 20 MG tablet Take 20 mg by mouth daily.    Yes [provider]  Glucosamine HCl 1500 MG TABS Take 1,500 mg by mouth daily.    [provider]  ibandronate (BONIVA) 150 MG tablet Take 150 mg by mouth every 30 (thirty) days. Take in the morning with a full glass of water, on an empty stomach, and do not take anything else by mouth or lie down for the next 30 min.    [provider]  Multiple Vitamin (MULTI VITAMIN DAILY PO) Take 1 tablet by mouth daily. Centrum silver    [provider]  OVER THE COUNTER MEDICATION Place 1 spray into the nose daily. NeilMed Sinus Rinse    [provider]  oxyCODONE (OXY IR/ROXICODONE) 5 MG immediate release tablet Take 1-2 tablets (5-10 mg total) by mouth every 4  (four) hours as needed for breakthrough pain. 09/24/16   Duanne Guess, PA-C  oxyCODONE (ROXICODONE) 5 MG immediate release tablet Take 1 tablet (5 mg total) by mouth every 4 (four) hours as needed for severe pain. 06/23/17   Hessie Knows, MD  warfarin (COUMADIN) 1 MG tablet Take 2 mg by mouth 2 (two) times daily. Takes 2 tablets in the morning and 2 in the evening    [provider]      VITAL SIGNS:  Blood pressure 126/76, pulse 75, temperature 98.9 F (37.2 C), resp. rate (!) 21, weight 56.5 kg (124 lb 8 oz), SpO2 94 %.  PHYSICAL EXAMINATION:   Physical Exam  GENERAL:  84 y.o.-year-old elderly patient lying in the bed with no acute distress.  EYES: Pupils equal, round, reactive to light and accommodation. No scleral icterus. Extraocular muscles intact.  HEENT: Head atraumatic, normocephalic. Oropharynx and nasopharynx clear.  NECK:  Supple, no jugular venous distention. No thyroid enlargement, no tenderness.  LUNGS: coarse breath sounds bilaterally, rales at the bases, no wheezing, rhonchi or crepitation. No use of accessory muscles of respiration.  CARDIOVASCULAR: S1, S2 normal. No  rubs, or gallops. 2/6 systolic murmur present ABDOMEN: Soft, nontender, nondistended. Bowel sounds present. No organomegaly or mass.  EXTREMITIES: No pedal edema, cyanosis, or clubbing.  NEUROLOGIC: Cranial nerves II through XII are intact. Muscle strength 5/5 in all extremities. Sensation intact. Gait not checked. Global weakness PSYCHIATRIC: The patient is alert and oriented x 3.  SKIN: No obvious rash, lesion, or ulcer.   LABORATORY PANEL:   CBC No results for input(s): WBC, HGB, HCT, PLT in the last 168 hours. ------------------------------------------------------------------------------------------------------------------  Chemistries  No results for input(s): NA, K, CL, CO2, GLUCOSE, BUN, CREATININE, CALCIUM, MG, AST, ALT, ALKPHOS, BILITOT in the last 168 hours.  Invalid input(s):  GFRCGP ------------------------------------------------------------------------------------------------------------------  Cardiac Enzymes No results for input(s): TROPONINI in the last 168 hours. ------------------------------------------------------------------------------------------------------------------  RADIOLOGY:  Dg Thoracic Spine 2 View  Result Date: 06/23/2017 CLINICAL DATA:  Kyphoplasty EXAM: DG C-ARM 61-120 MIN; THORACIC SPINE 2 VIEWS COMPARISON:  Chest radiograph 08/24/2013, 12/07/2003 FLUOROSCOPY TIME:  1 minutes 20 seconds Images submitted: 2 Dose: 2.8 mGy FINDINGS: Single level spinal augmentation has been performed. Mild anterior height loss of the involved vertebral body. Unable to accurately assign vertebral segment level due to lack of bony landmarks. IMPRESSION: Single level spinal augmentation procedure in the thoracic spine, though unable to accurately assign a segment level; recommend correlation with surgical report. Electronically Signed   By: Lavonia Dana M.D.   On: 06/23/2017 11:54   Dg Chest Port 1 View  Result Date: 06/23/2017 CLINICAL DATA:  Hypoxia status post kyphoplasty. EXAM: PORTABLE CHEST 1 VIEW COMPARISON:  08/24/2013 FINDINGS: 1248 hours. The cardio pericardial silhouette is enlarged. Diffuse interstitial and patchy/nodular opacity is noted bilaterally. Skin fold noted over the right hemithorax. Bones are diffusely demineralized. Left-sided dual lead permanent pacemaker noted. Telemetry leads overlie the chest. IMPRESSION: Diffuse interstitial and patchy/nodular opacities bilaterally. Imaging findings likely related to pulmonary edema. Close follow-up recommended to ensure resolution as infection or metastatic disease could have this appearance. Electronically Signed   By: Misty Stanley M.D.   On: 06/23/2017 13:14   Dg C-arm 1-60 Min  Result Date: 06/23/2017 CLINICAL DATA:  Kyphoplasty EXAM: DG C-ARM 61-120 MIN; THORACIC SPINE 2 VIEWS COMPARISON:  Chest  radiograph 08/24/2013, 12/07/2003 FLUOROSCOPY TIME:  1 minutes 20 seconds Images submitted: 2 Dose: 2.8 mGy FINDINGS: Single level spinal augmentation has been performed. Mild anterior height loss of the involved vertebral body. Unable to accurately assign vertebral segment level due to lack of bony landmarks. IMPRESSION: Single level spinal augmentation procedure in the thoracic spine, though unable to accurately assign a segment level; recommend correlation with surgical report. Electronically Signed   By: Lavonia Dana M.D.   On: 06/23/2017 11:54    EKG:   Orders placed or performed during the hospital encounter of 06/23/17  . EKG 12-Lead  . EKG 12-Lead  . EKG 12-Lead  . EKG 12-Lead    IMPRESSION AND PLAN:   Alejandra Wiley  is a 84 y.o. female with a known history of permanent A. fib status post pacemaker for sick sinus syndrome, on Coumadin, history of syncope, arthritis was having a kyphoplasty for a T9 compression fracture.  Postprocedure, when patient was being turned from prone to supine position, she started having wide-complex tachycardia and hypoxia  1.  Acute hypoxia-postprocedure, likely secondary to acute flash pulmonary edema, received IV fluids postprocedure due to hypertension. -1 dose of Lasix ordered.  Echocardiogram -Currently on 2 L oxygen.  Wean as tolerated.  Monitor on telemetry -Discontinue IV fluids -Follow-up chest x-ray in a.m. and use Lasix as needed  2.  Wide-complex tachycardia-appreciate cardiology consult -Case was discussed with her EP cardiologist who recommended device interrogation.  Device has been reprogrammed in PACU.  They will check again tomorrow. -Currently underlying A. fib rhythm -Continue her flecainide, nadolol -Restart Coumadin from tomorrow  3.  Paroxysmal atrial fibrillation-as mentioned above, restarted flecainide, nadolol and Coumadin from tomorrow  4.  T9 compression fracture-status post kyphoplasty today -Pain medications as needed  and physical therapy consulted  5.  DVT prophylaxis-on Coumadin  All the records are reviewed and case discussed with ED provider. Management plans discussed with the patient, family and they are in agreement.  CODE STATUS: Full Code  TOTAL TIME TAKING CARE OF THIS PATIENT: 50 minutes.    Gladstone Lighter M.D on 06/23/2017 at 5:12 PM  Between 7am to 6pm - Pager - [phone]  After 6pm go to [email or web address] - password [email or web address] [place] [facility]  Office  [phone]  CC: Primary care physician; Kirk Ruths, MD

## 2017-06-23 NOTE — Progress Notes (Signed)
Nonproductive cough   Congested cough   Blood pressure down   Head down and fluids up

## 2017-06-23 NOTE — OR Nursing (Signed)
Patient with history of MRSA on PCR. PCR nasal swab sent today. Dr. Rudene Christians notified and antibiotic questioned with Dr. Rudene Christians and he wants [place] to keep ancef as antibiotic today.

## 2017-06-23 NOTE — H&P (Signed)
Reviewed paper H+P, will be scanned into chart. No changes noted.  

## 2017-06-23 NOTE — Anesthesia Preprocedure Evaluation (Signed)
Anesthesia Evaluation  Patient identified by MRN, date of birth, ID band Patient awake    Reviewed: Allergy & Precautions, H&P , NPO status , reviewed documented beta blocker date and time   Airway Mallampati: II  TM Distance: >3 FB Neck ROM: full    Dental  (+) Chipped   Pulmonary    Pulmonary exam normal        Cardiovascular Normal cardiovascular exam+ pacemaker   2011 ECHO Study Conclusions  - Left ventricle: The cavity size was normal. Wall thickness was  normal. Systolic function was normal. The estimated ejection  fraction was in the range of 55% to 60%. Wall motion was normal;  there were no regional wall motion abnormalities. - Mitral valve: Mild regurgitation. - Left atrium: The atrium was mildly dilated. - Right atrium: The atrium was mildly dilated. - Pulmonary arteries: Systolic pressure was mildly increased.   Neuro/Psych    GI/Hepatic neg GERD  ,  Endo/Other    Renal/GU      Musculoskeletal   Abdominal   Peds  Hematology   Anesthesia Other Findings Past Medical History: No date: Arrhythmia     Comment:  A-Fib No date: Bradycardia No date: Cancer [facility])     Comment:  vaginal intraepithelial neoplasia 12/06/2003: Pacemaker     Comment:  Medtronic Impulse DTDR01 2015: Pacemaker No date: Sick sinus syndrome ([place]) No date: Sinoatrial node dysfunction ([place]) 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  Past Surgical History: No date: APPENDECTOMY No date: HERNIA REPAIR     Comment:  lower abd 2015: IMAGE GUIDED SINUS SURGERY 12/06/2003: INSERT / REPLACE / REMOVE PACEMAKER     Comment:  Medtronic impulse DTDR01 No date: JOINT REPLACEMENT; Right     Comment:  knee 1999: LEEP 02/2016: ORIF HIP FRACTURE; Right 09/18/2012: PACEMAKER GENERATOR CHANGE; N/A     Comment:  Procedure: PACEMAKER GENERATOR CHANGE;  Surgeon: Deboraha Sprang, MD;  Location: [facility] CATH LAB;  Service:               Cardiovascular;  Laterality: N/A; No date: TONSILLECTOMY No date: TOTAL KNEE ARTHROPLASTY 09/23/2016: TOTAL KNEE REVISION; Right     Comment:  Procedure: TOTAL KNEE REVISION-POLYETHYLENE EXCHANGE;                Surgeon: Hessie Knows, MD;  Location: [facility] ORS;                Service: Orthopedics;  Laterality: Right; No date: VAGINAL HYSTERECTOMY  BMI    Body Mass Index:  20.09 kg/m      Reproductive/Obstetrics                             Anesthesia Physical Anesthesia Plan  ASA: III  Anesthesia Plan: General   Post-op Pain Management:    Induction:   PONV Risk Score and Plan: 3 and Treatment may vary due to age or medical condition, TIVA and Ondansetron  Airway Management Planned:   Additional Equipment:   Intra-op Plan:   Post-operative Plan:   Informed Consent: I have reviewed the patients History and Physical, chart, labs and discussed the procedure including the risks, benefits and alternatives for the proposed anesthesia with the patient or authorized representative who has indicated his/her understanding and acceptance.   Dental Advisory Given  Plan Discussed with: CRNA  Anesthesia Plan Comments:  Anesthesia Quick Evaluation  

## 2017-06-24 ENCOUNTER — Telehealth: Payer: Self-pay | Admitting: Internal Medicine

## 2017-06-24 ENCOUNTER — Encounter: Payer: Self-pay | Admitting: Orthopedic Surgery

## 2017-06-24 ENCOUNTER — Observation Stay: Payer: Medicare HMO

## 2017-06-24 ENCOUNTER — Observation Stay (HOSPITAL_BASED_OUTPATIENT_CLINIC_OR_DEPARTMENT_OTHER)
Admission: RE | Admit: 2017-06-24 | Discharge: 2017-06-24 | Disposition: A | Discharge status: Home / Self Care | Payer: Medicare HMO | Source: Ambulatory Visit | Attending: Internal Medicine | Admitting: Internal Medicine

## 2017-06-24 DIAGNOSIS — J811 Chronic pulmonary edema: Secondary | ICD-10-CM | POA: Diagnosis not present

## 2017-06-24 DIAGNOSIS — I480 Paroxysmal atrial fibrillation: Secondary | ICD-10-CM | POA: Diagnosis not present

## 2017-06-24 DIAGNOSIS — M6281 Muscle weakness (generalized): Secondary | ICD-10-CM | POA: Diagnosis not present

## 2017-06-24 DIAGNOSIS — I471 Supraventricular tachycardia: Secondary | ICD-10-CM

## 2017-06-24 DIAGNOSIS — I340 Nonrheumatic mitral (valve) insufficiency: Secondary | ICD-10-CM

## 2017-06-24 DIAGNOSIS — I9589 Other hypotension: Secondary | ICD-10-CM | POA: Diagnosis not present

## 2017-06-24 DIAGNOSIS — Z79899 Other long term (current) drug therapy: Secondary | ICD-10-CM | POA: Diagnosis not present

## 2017-06-24 DIAGNOSIS — J810 Acute pulmonary edema: Secondary | ICD-10-CM | POA: Diagnosis not present

## 2017-06-24 DIAGNOSIS — R0902 Hypoxemia: Secondary | ICD-10-CM | POA: Diagnosis not present

## 2017-06-24 DIAGNOSIS — I4891 Unspecified atrial fibrillation: Secondary | ICD-10-CM | POA: Diagnosis not present

## 2017-06-24 DIAGNOSIS — Z7901 Long term (current) use of anticoagulants: Secondary | ICD-10-CM | POA: Diagnosis not present

## 2017-06-24 DIAGNOSIS — I472 Ventricular tachycardia: Secondary | ICD-10-CM | POA: Diagnosis not present

## 2017-06-24 DIAGNOSIS — M4854XA Collapsed vertebra, not elsewhere classified, thoracic region, initial encounter for fracture: Secondary | ICD-10-CM | POA: Diagnosis not present

## 2017-06-24 LAB — PROTIME-INR
INR: 1.21
PROTHROMBIN TIME: 15.2 s (ref 11.4–15.2)

## 2017-06-24 LAB — CBC
HCT: 29.9 % — ABNORMAL LOW (ref 35.0–47.0)
HEMOGLOBIN: 10.1 g/dL — AB (ref 12.0–16.0)
MCH: 30.9 pg (ref 26.0–34.0)
MCHC: 33.9 g/dL (ref 32.0–36.0)
MCV: 91.1 fL (ref 80.0–100.0)
Platelets: 211 10*3/uL (ref 150–440)
RBC: 3.28 MIL/uL — AB (ref 3.80–5.20)
RDW: 13.9 % (ref 11.5–14.5)
WBC: 11.2 10*3/uL — AB (ref 3.6–11.0)

## 2017-06-24 LAB — SURGICAL PATHOLOGY

## 2017-06-24 LAB — BASIC METABOLIC PANEL
ANION GAP: 8 (ref 5–15)
BUN: 19 mg/dL (ref 6–20)
CALCIUM: 8.4 mg/dL — AB (ref 8.9–10.3)
CO2: 27 mmol/L (ref 22–32)
Chloride: 99 mmol/L — ABNORMAL LOW (ref 101–111)
Creatinine, Ser: 0.83 mg/dL (ref 0.44–1.00)
Glucose, Bld: 99 mg/dL (ref 65–99)
Potassium: 3.5 mmol/L (ref 3.5–5.1)
Sodium: 134 mmol/L — ABNORMAL LOW (ref 135–145)

## 2017-06-24 LAB — ECHOCARDIOGRAM COMPLETE
HEIGHTINCHES: 66 in
WEIGHTICAEL: 2160 [oz_av]

## 2017-06-24 LAB — TSH: TSH: 1.442 u[IU]/mL (ref 0.350–4.500)

## 2017-06-24 LAB — MAGNESIUM: Magnesium: 1.9 mg/dL (ref 1.7–2.4)

## 2017-06-24 MED ORDER — FUROSEMIDE 10 MG/ML IJ SOLN
40.0000 mg | Freq: Once | INTRAMUSCULAR | Status: AC
  Administered 2017-06-24: 40 mg via INTRAVENOUS
  Filled 2017-06-24: qty 4

## 2017-06-24 NOTE — Progress Notes (Signed)
Progress Note  Patient Name: Alejandra Wiley Date of Encounter: 06/24/2017  Primary Cardiologist: Caryl Comes  Subjective   MDT rep interrogated device on 6/6 which showed atrial tachycardia. Device has been reprogrammed and appears to be functioning normally on telemetry. INR this morning 1.21, now back on Coumadin. CXR this morning showed improved pulmonary infiltrates with persistent bronchitic changes. Back pain resolved following surgery.   Inpatient Medications    Scheduled Meds: . flecainide  75 mg Oral Q12H  . fluticasone  2 spray Each Nare Daily  . furosemide  40 mg Intravenous Once  . multivitamin with minerals  1 tablet Oral Daily  . mupirocin ointment  1 application Nasal BID  . nadolol  20 mg Oral Daily  . warfarin  2 mg Oral BID  . Warfarin - Physician Dosing Inpatient   Does not apply q1800   Continuous Infusions: . sodium chloride     PRN Meds: acetaminophen, diphenhydrAMINE, HYDROcodone-acetaminophen, metoCLOPramide **OR** metoCLOPramide (REGLAN) injection, ondansetron **OR** ondansetron (ZOFRAN) IV, ondansetron **OR** ondansetron (ZOFRAN) IV, oxyCODONE   Vital Signs    Vitals:   06/23/17 2001 06/23/17 2207 06/24/17 0339 06/24/17 0831  BP: (!) 97/48 (!) 143/54 (!) 109/49 118/67  Pulse: 67  62 63  Resp: 18  19 14   Temp: 98.4 F (36.9 C)  98.5 F (36.9 C) 97.8 F (36.6 C)  TempSrc: Oral  Oral   SpO2: 93%  90% 92%  Weight:      Height:        Intake/Output Summary (Last 24 hours) at 06/24/2017 0933 Last data filed at 06/23/2017 2346 Gross per 24 hour  Intake 1880 ml  Output 505 ml  Net 1375 ml   Filed Weights   06/23/17 0756 06/23/17 1722  Weight: 124 lb 8 oz (56.5 kg) 135 lb (61.2 kg)    Telemetry    NSR with intermittent pacing - Personally Reviewed  ECG    n/a - Personally Reviewed  Physical Exam   GEN: No acute distress.   Neck: No JVD. Cardiac: RRR, no murmurs, rubs, or gallops.  Respiratory: Diminished breath sounds bilaterally.    GI: Soft, nontender, non-distended.   MS: No edema; No deformity. Neuro:  Alert and oriented x 3; Nonfocal.  Psych: Normal affect.  Labs    Chemistry Recent Labs  Lab 06/23/17 1743 06/24/17 0439  NA 133* 134*  K 4.1 3.5  CL 98* 99*  CO2 26 27  GLUCOSE 186* 99  BUN 17 19  CREATININE 0.73 0.83  CALCIUM 8.4* 8.4*  GFRNONAA >60 >60  GFRAA >60 >60  ANIONGAP 9 8     Hematology Recent Labs  Lab 06/23/17 1743 06/24/17 0439  WBC 12.8* 11.2*  RBC 3.70* 3.28*  HGB 11.4* 10.1*  HCT 33.9* 29.9*  MCV 91.5 91.1  MCH 30.8 30.9  MCHC 33.6 33.9  RDW 14.2 13.9  PLT 242 211    Cardiac EnzymesNo results for input(s): TROPONINI in the last 168 hours. No results for input(s): TROPIPOC in the last 168 hours.   BNPNo results for input(s): BNP, PROBNP in the last 168 hours.   DDimer No results for input(s): DDIMER in the last 168 hours.   Radiology    Dg Thoracic Spine 2 View  Result Date: 06/23/2017 IMPRESSION: Single level spinal augmentation procedure in the thoracic spine, though unable to accurately assign a segment level; recommend correlation with surgical report. Electronically Signed   By: Lavonia Dana M.D.   On: 06/23/2017 11:54  Dg Chest Port 1 View  Result Date: 06/24/2017 IMPRESSION: Improved pulmonary infiltrates. Persistent bronchitic changes. Electronically Signed   By: Lavonia Dana M.D.   On: 06/24/2017 09:10   Dg Chest Port 1 View  Result Date: 06/23/2017 IMPRESSION: Diffuse interstitial and patchy/nodular opacities bilaterally. Imaging findings likely related to pulmonary edema. Close follow-up recommended to ensure resolution as infection or metastatic disease could have this appearance. Electronically Signed   By: Misty Stanley M.D.   On: 06/23/2017 13:14   Dg C-arm 1-60 Min  Result Date: 06/23/2017 IMPRESSION: Single level spinal augmentation procedure in the thoracic spine, though unable to accurately assign a segment level; recommend correlation with surgical  report. Electronically Signed   By: Lavonia Dana M.D.   On: 06/23/2017 11:54    Cardiac Studies   TTE pending  Patient Profile     84 y.o. female with history of PAF on Coumadin nadolol, and flecainide, and sick sinus syndrome s/p dual chamber MDT PPM in 2005 with generator change out in 2014 who is being seen today for the evaluation of tachycardia.  Assessment & Plan    1. Atrial tachycardia: -Device interrogated and reprogrammed -Appears to be functioning normally on telemetry -Lytes at goal -Will need EP follow up   2. Flash pulmonary edema: -Likely in the setting of the above and IV fluids -Status post IV diuresis with improvement in SOB and CXR -Due for one more dose of IV Lasix per IM  3. PAF: -Nadolol, flecainide, Coumadin -EP follow up  4. Hypotension: -Resolved -Likely in the setting of the above and anesthesia/pain medications  For questions or updates, please contact [place] Please consult [email or web address] for contact info under Cardiology/STEMI.    Signed, Christell Faith, PA-C [place] Pager: [phone] 06/24/2017, 9:33 AM

## 2017-06-24 NOTE — Progress Notes (Signed)
*  PRELIMINARY RESULTS* Echocardiogram 2D Echocardiogram has been performed.  Alejandra Wiley 06/24/2017, 9:29 AM

## 2017-06-24 NOTE — Anesthesia Postprocedure Evaluation (Addendum)
Anesthesia Post Note  Patient: SREYA FROIO  Procedure(s) Performed: Deatra Canter (N/A )  Patient location during evaluation: PACU Anesthesia Type: General Level of consciousness: awake and alert Pain management: pain level controlled Vital Signs Assessment: post-procedure vital signs reviewed and stable Respiratory status: spontaneous breathing, nonlabored ventilation, respiratory function stable and patient connected to nasal cannula oxygen Cardiovascular status: blood pressure returned to baseline and stable Postop Assessment: no apparent nausea or vomiting Anesthetic complications: no Comments: Pt had complicated PACU course due to pacemaker tracking atrial electrical activity at max 120/beats-min and resulting flash pulm edema and unstable BP. Pacer was reset w magnet and subsequently w programmer. HR decreased to 80-90's and sats/BP improved. Chest cleared rapidly and pt was admitted for observation. Seen by Cardiology and Hospitalist in PACU.     Last Vitals:  Vitals:   06/24/17 0339 06/24/17 0831  BP: (!) 109/49 118/67  Pulse: 62 63  Resp: 19 14  Temp: 36.9 C 36.6 C  SpO2: 90% 92%    Last Pain:  Vitals:   06/24/17 0843  TempSrc:   PainSc: 0-No pain                 Alphonsus Sias

## 2017-06-24 NOTE — Care Management Note (Signed)
Case Management Note  Patient Details  Name: Alejandra Wiley MRN: [id number] Date of Birth: [birth year]  Subjective/Objective:      Admitted to [facility] with the diagnosis of hypoxia under observation status. Lives and care for husband, [place] [phone]). Lives at [place]. Last seen Dr. Frazier Richards 05/25/17.    Takes care of all basic activities of daily living herself. Friends and family helps with errands. No falls. Fair appetite.   Discharge to home today per Dr. Benjie Karvonen         Action/Plan: Physical therapy evaluation completed. No follow-up needs identified   Expected Discharge Date:  06/24/17               Expected Discharge Plan:     In-House Referral:     Discharge planning Services     Post Acute Care Choice:    Choice offered to:     DME Arranged:    DME Agency:     HH Arranged:    HH Agency:     Status of Service:     If discussed at [employer], dates discussed:    Additional Comments:  Shelbie Ammons, RN MSN CCM Care Management [phone] 06/24/2017, 12:12 PM

## 2017-06-24 NOTE — Telephone Encounter (Signed)
Done

## 2017-06-24 NOTE — Telephone Encounter (Signed)
Anderson Malta,   Can you check with the patient & see if she can come in to see Dr. Olin Pia on Tuesday 06/28/17 at 9:00 am (phys pacer check) instead of with Ryan.   Thanks!   TCM call- 6/10 due to discharge today.

## 2017-06-24 NOTE — Care Management Obs Status (Signed)
[place] NOTIFICATION   Patient Details  Name: RAELIN PIXLER MRN: [id number] Date of Birth: [birth year]   Medicare Observation Status Notification Given:  Yes    Shelbie Ammons, RN 06/24/2017, 11:22 AM

## 2017-06-24 NOTE — Evaluation (Signed)
Physical Therapy Evaluation Patient Details Name: Alejandra Wiley MRN: [id number] DOB: Feb 06, 1932 Today's Date: 06/24/2017   History of Present Illness  84 y/o female here with some complications post T9 kyphoplasty.  H/o A. fib status post pacemaker for sick sinus syndrome, on Coumadin, history of syncope.  Clinical Impression  Pt did well with PT exam showing good balance and confidence with mobility and ambulation.  She did have one small stagger step while talking in the hall (did walk 200 ft w/o issue). Pt states that she has had numerous rounds of PT with 4 R LE sx and is confident that she can stay active, do the exercises she is familiar with and be safe at her ALF with grab bars, emergency call bells, etc.  Not further PT needs at this time, safe to return home.    Follow Up Recommendations No PT follow up(has had a lot of PT in the past, is active, feels confident)    Equipment Recommendations  None recommended by PT    Recommendations for Other Services       Precautions / Restrictions Precautions Precautions: Fall Restrictions Weight Bearing Restrictions: No      Mobility  Bed Mobility Overal bed mobility: Independent                Transfers Overall transfer level: Independent Equipment used: None             General transfer comment: Pt able to rise to standing w/o assist and showed good balance, confidence and safety   Ambulation/Gait Ambulation/Gait assistance: Min guard Ambulation Distance (Feet): 200 Feet Assistive device: None       General Gait Details: Pt did have one stagger steps but generally was confident and safe with slow but consistent cadence.  Stairs            Wheelchair Mobility    Modified Rankin (Stroke Patients Only)       Balance Overall balance assessment: Modified Independent                                           Pertinent Vitals/Pain Pain Assessment: No/denies pain    Home  Living Family/patient expects to be discharged to:: Other (Comment)([facility])                 Additional Comments: 72 year old husband with dementia recently moved to long term so she is alone    Prior Function Level of Independence: Independent         Comments: Pt has cane, rarely needs.  Pt is able to drive, run errands, etc     Hand Dominance        Extremity/Trunk Assessment   Upper Extremity Assessment Upper Extremity Assessment: Overall WFL for tasks assessed;Generalized weakness    Lower Extremity Assessment Lower Extremity Assessment: Overall WFL for tasks assessed;Generalized weakness       Communication   Communication: No difficulties  Cognition Arousal/Alertness: Awake/alert Behavior During Therapy: WFL for tasks assessed/performed Overall Cognitive Status: Within Functional Limits for tasks assessed                                        General Comments      Exercises     Assessment/Plan  PT Assessment Patent does not need any further PT services  PT Problem List         PT Treatment Interventions      PT Goals (Current goals can be found in the Care Plan section)       Frequency     Barriers to discharge        Co-evaluation               AM-PAC PT "6 Clicks" Daily Activity  Outcome Measure Difficulty turning over in bed (including adjusting bedclothes, sheets and blankets)?: None Difficulty moving from lying on back to sitting on the side of the bed? : None Difficulty sitting down on and standing up from a chair with arms (e.g., wheelchair, bedside commode, etc,.)?: None Help needed moving to and from a bed to chair (including a wheelchair)?: None Help needed walking in hospital room?: None Help needed climbing 3-5 steps with a railing? : None 6 Click Score: 24    End of Session Equipment Utilized During Treatment: Gait belt Activity Tolerance: Patient tolerated  treatment well Patient left: with call bell/phone within reach   PT Visit Diagnosis: Muscle weakness (generalized) (M62.81);Difficulty in walking, not elsewhere classified (R26.2)    Time: 1050-1108 PT Time Calculation (min) (ACUTE ONLY): 18 min   Charges:   PT Evaluation $PT Eval Low Complexity: 1 Low     PT G CodesKreg Shropshire, DPT 06/24/2017, 11:59 AM

## 2017-06-24 NOTE — Discharge Summary (Signed)
[facility] [place] NAME: Alejandra Wiley    MR#:  [id number]  DATE OF BIRTH:  09-24-1932  DATE OF ADMISSION:  06/23/2017 ADMITTING PHYSICIAN: Hessie Knows, MD  DATE OF DISCHARGE: 06/24/2017  PRIMARY CARE PHYSICIAN: Kirk Ruths, MD    ADMISSION DIAGNOSIS:  non traumatic compression fracture of ninth thoracic vetebra  DISCHARGE DIAGNOSIS:  Active Problems:   Hypoxia   SECONDARY DIAGNOSIS:   Past Medical History:  Diagnosis Date  . Bradycardia   . 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 Pacemaker 12/06/2003   Medtronic Impulse [id number]  . Pacemaker 2015  . PAF (paroxysmal atrial fibrillation) (HCC)    a. on Coumadin and flecainide; b. CHADS2VASc 3 (HTN, age x 2, female)  . Sick sinus syndrome ([place])   . Sinoatrial node dysfunction (HCC)   . Vaginal intraepithelial neoplasia     HOSPITAL COURSE:   84 year old female with history of PAF and pacemaker who had wide-complex tachycardia and hypoxia after kyphoplasty for T9 compression fracture.   1.  Atrial tachycardia: Device was interrogated and reprogrammed.  It appears to be functioning normally.  Patient will have outpatient EP follow-up.  2.  Acute hypoxia due to flash pulmonary edema in the setting of atrial tachycardia which has resolved.  3.  PAF: Patient will continue nadolol, flecainide and Coumadin  4.  T9 compression fracture status post kyphoplasty: Patient will follow up with Dr. Rudene Christians.  DISCHARGE CONDITIONS AND DIET:   Stable for discharge on cardiac diet   CONSULTS OBTAINED:  Treatment Team:  Minna Merritts, MD Gladstone Lighter, MD  DRUG ALLERGIES:   Allergies  Allergen Reactions  . Latex Other (See Comments)    blisters  . Tape Other (See Comments)    Blisters/ paper tape is Ok  . Xarelto [Rivaroxaban] Other (See Comments)    Gums bleeding and too much bruising  . Nickel Rash    DISCHARGE MEDICATIONS:   Allergies as of  06/24/2017      Reactions   Latex Other (See Comments)   blisters   Tape Other (See Comments)   Blisters/ paper tape is Ok   Xarelto [rivaroxaban] Other (See Comments)   Gums bleeding and too much bruising   Nickel Rash      Medication List    TAKE these medications   acetaminophen 500 MG tablet Commonly known as:  TYLENOL Take 500 mg by mouth every 8 (eight) hours as needed for mild pain or headache.   amLODipine 5 MG tablet Commonly known as:  NORVASC Take 5 mg by mouth daily.   diphenhydrAMINE 25 mg capsule Commonly known as:  BENADRYL Take 25 mg by mouth every 8 (eight) hours as needed for itching. Takes along with Tylenol   flecainide 50 MG tablet Commonly known as:  TAMBOCOR TAKE 1 AND 1/2 TABLETS TWICE DAILY   fluticasone 50 MCG/ACT nasal spray Commonly known as:  FLONASE Place 2 sprays into both nostrils daily.   Glucosamine HCl 1500 MG Tabs Take 1,500 mg by mouth daily.   ibandronate 150 MG tablet Commonly known as:  BONIVA Take 150 mg by mouth every 30 (thirty) days. Take in the morning with a full glass of water, on an empty stomach, and do not take anything else by mouth or lie down for the next 30 min.   MULTI VITAMIN DAILY PO Take 1 tablet by mouth daily. Centrum silver   nadolol 20 MG tablet Commonly known  as:  CORGARD Take 20 mg by mouth daily.   OVER THE COUNTER MEDICATION Place 1 spray into the nose daily. NeilMed Sinus Rinse   oxyCODONE 5 MG immediate release tablet Commonly known as:  Oxy IR/ROXICODONE Take 1-2 tablets (5-10 mg total) by mouth every 4 (four) hours as needed for breakthrough pain. What changed:  Another medication with the same name was added. Make sure you understand how and when to take each.   oxyCODONE 5 MG immediate release tablet Commonly known as:  ROXICODONE Take 1 tablet (5 mg total) by mouth every 4 (four) hours as needed for severe pain. What changed:  You were already taking a medication with the same name, and  this prescription was added. Make sure you understand how and when to take each.   warfarin 1 MG tablet Commonly known as:  COUMADIN Take 2 mg by mouth 2 (two) times daily.         Today   CHIEF COMPLAINT:  No acute events overnight.  Patient is no longer short of breath.   VITAL SIGNS:  Blood pressure 118/67, pulse 63, temperature 97.8 F (36.6 C), resp. rate 14, height 5\' 6"  (1.676 m), weight 61.2 kg (135 lb), SpO2 92 %.   REVIEW OF SYSTEMS:  Review of Systems  Constitutional: Negative.  Negative for chills, fever and malaise/fatigue.  HENT: Negative.  Negative for ear discharge, ear pain, hearing loss, nosebleeds and sore throat.   Eyes: Negative.  Negative for blurred vision and pain.  Respiratory: Negative.  Negative for cough, hemoptysis, shortness of breath and wheezing.   Cardiovascular: Negative.  Negative for chest pain, palpitations and leg swelling.  Gastrointestinal: Negative.  Negative for abdominal pain, blood in stool, diarrhea, nausea and vomiting.  Genitourinary: Negative.  Negative for dysuria.  Musculoskeletal: Negative.  Negative for back pain.  Skin: Negative.   Neurological: Negative for dizziness, tremors, speech change, focal weakness, seizures and headaches.  Endo/Heme/Allergies: Negative.  Does not bruise/bleed easily.  Psychiatric/Behavioral: Negative.  Negative for depression, hallucinations and suicidal ideas.     PHYSICAL EXAMINATION:  GENERAL:  84 y.o.-year-old patient lying in the bed with no acute distress.  NECK:  Supple, no jugular venous distention. No thyroid enlargement, no tenderness.  LUNGS: Normal breath sounds bilaterally, no wheezing, rales,rhonchi  No use of accessory muscles of respiration.  CARDIOVASCULAR: S1, S2 normal. No murmurs, rubs, or gallops.  ABDOMEN: Soft, non-tender, non-distended. Bowel sounds present. No organomegaly or mass.  EXTREMITIES: No pedal edema, cyanosis, or clubbing.  PSYCHIATRIC: The patient is  alert and oriented x 3.  SKIN: No obvious rash, lesion, or ulcer.   DATA REVIEW:   CBC Recent Labs  Lab 06/24/17 0439  WBC 11.2*  HGB 10.1*  HCT 29.9*  PLT 211    Chemistries  Recent Labs  Lab 06/23/17 0439  06/24/17 0439  NA  --    < > 134*  K  --    < > 3.5  CL  --    < > 99*  CO2  --    < > 27  GLUCOSE  --    < > 99  BUN  --    < > 19  CREATININE  --    < > 0.83  CALCIUM  --    < > 8.4*  MG 1.9  --   --    < > = values in this interval not displayed.    Cardiac Enzymes No results for input(s): TROPONINI in  the last 168 hours.  Microbiology Results  @MICRORSLT48 @  RADIOLOGY:  Dg Thoracic Spine 2 View  Result Date: 06/23/2017 CLINICAL DATA:  Kyphoplasty EXAM: DG C-ARM 61-120 MIN; THORACIC SPINE 2 VIEWS COMPARISON:  Chest radiograph 08/24/2013, 12/07/2003 FLUOROSCOPY TIME:  1 minutes 20 seconds Images submitted: 2 Dose: 2.8 mGy FINDINGS: Single level spinal augmentation has been performed. Mild anterior height loss of the involved vertebral body. Unable to accurately assign vertebral segment level due to lack of bony landmarks. IMPRESSION: Single level spinal augmentation procedure in the thoracic spine, though unable to accurately assign a segment level; recommend correlation with surgical report. Electronically Signed   By: Lavonia Dana M.D.   On: 06/23/2017 11:54   Dg Chest Port 1 View  Result Date: 06/24/2017 CLINICAL DATA:  Pulmonary edema, history of bradycardia and atrial fibrillation with pacemaker, vaginal intraepithelial neoplasia EXAM: PORTABLE CHEST 1 VIEW COMPARISON:  Portable exam 0729 hours compared to 06/23/2017 FINDINGS: LEFT subclavian sequential pacemaker leads stable projecting over RIGHT atrium and RIGHT ventricle. Borderline enlargement of cardiac silhouette. Mediastinal contours and pulmonary vascularity normal. Bronchitic changes with improved pulmonary infiltrates since previous exam. No segmental consolidation, pleural effusion or pneumothorax.  Bones diffusely demineralized. IMPRESSION: Improved pulmonary infiltrates. Persistent bronchitic changes. Electronically Signed   By: Lavonia Dana M.D.   On: 06/24/2017 09:10   Dg Chest Port 1 View  Result Date: 06/23/2017 CLINICAL DATA:  Hypoxia status post kyphoplasty. EXAM: PORTABLE CHEST 1 VIEW COMPARISON:  08/24/2013 FINDINGS: 1248 hours. The cardio pericardial silhouette is enlarged. Diffuse interstitial and patchy/nodular opacity is noted bilaterally. Skin fold noted over the right hemithorax. Bones are diffusely demineralized. Left-sided dual lead permanent pacemaker noted. Telemetry leads overlie the chest. IMPRESSION: Diffuse interstitial and patchy/nodular opacities bilaterally. Imaging findings likely related to pulmonary edema. Close follow-up recommended to ensure resolution as infection or metastatic disease could have this appearance. Electronically Signed   By: Misty Stanley M.D.   On: 06/23/2017 13:14   Dg C-arm 1-60 Min  Result Date: 06/23/2017 CLINICAL DATA:  Kyphoplasty EXAM: DG C-ARM 61-120 MIN; THORACIC SPINE 2 VIEWS COMPARISON:  Chest radiograph 08/24/2013, 12/07/2003 FLUOROSCOPY TIME:  1 minutes 20 seconds Images submitted: 2 Dose: 2.8 mGy FINDINGS: Single level spinal augmentation has been performed. Mild anterior height loss of the involved vertebral body. Unable to accurately assign vertebral segment level due to lack of bony landmarks. IMPRESSION: Single level spinal augmentation procedure in the thoracic spine, though unable to accurately assign a segment level; recommend correlation with surgical report. Electronically Signed   By: Lavonia Dana M.D.   On: 06/23/2017 11:54      Allergies as of 06/24/2017      Reactions   Latex Other (See Comments)   blisters   Tape Other (See Comments)   Blisters/ paper tape is Ok   Xarelto [rivaroxaban] Other (See Comments)   Gums bleeding and too much bruising   Nickel Rash      Medication List    TAKE these medications    acetaminophen 500 MG tablet Commonly known as:  TYLENOL Take 500 mg by mouth every 8 (eight) hours as needed for mild pain or headache.   amLODipine 5 MG tablet Commonly known as:  NORVASC Take 5 mg by mouth daily.   diphenhydrAMINE 25 mg capsule Commonly known as:  BENADRYL Take 25 mg by mouth every 8 (eight) hours as needed for itching. Takes along with Tylenol   flecainide 50 MG tablet Commonly known as:  TAMBOCOR TAKE 1 AND  1/2 TABLETS TWICE DAILY   fluticasone 50 MCG/ACT nasal spray Commonly known as:  FLONASE Place 2 sprays into both nostrils daily.   Glucosamine HCl 1500 MG Tabs Take 1,500 mg by mouth daily.   ibandronate 150 MG tablet Commonly known as:  BONIVA Take 150 mg by mouth every 30 (thirty) days. Take in the morning with a full glass of water, on an empty stomach, and do not take anything else by mouth or lie down for the next 30 min.   MULTI VITAMIN DAILY PO Take 1 tablet by mouth daily. Centrum silver   nadolol 20 MG tablet Commonly known as:  CORGARD Take 20 mg by mouth daily.   OVER THE COUNTER MEDICATION Place 1 spray into the nose daily. NeilMed Sinus Rinse   oxyCODONE 5 MG immediate release tablet Commonly known as:  Oxy IR/ROXICODONE Take 1-2 tablets (5-10 mg total) by mouth every 4 (four) hours as needed for breakthrough pain. What changed:  Another medication with the same name was added. Make sure you understand how and when to take each.   oxyCODONE 5 MG immediate release tablet Commonly known as:  ROXICODONE Take 1 tablet (5 mg total) by mouth every 4 (four) hours as needed for severe pain. What changed:  You were already taking a medication with the same name, and this prescription was added. Make sure you understand how and when to take each.   warfarin 1 MG tablet Commonly known as:  COUMADIN Take 2 mg by mouth 2 (two) times daily.          Management plans discussed with the patient and she is in agreement. Stable for  discharge home  Patient should follow up with pcp  CODE STATUS:     Code Status Orders  (From admission, onward)        Start     Ordered   06/23/17 1732  Full code  Continuous     06/23/17 1732    Code Status History    Date Active Date Inactive Code Status Order ID Comments User Context   09/23/2016 1825 09/24/2016 1702 Full Code [id number]  Hessie Knows, MD Inpatient    Advance Directive Documentation     Most Recent Value  Type of Advance Directive  Healthcare Power of [place], Living will  Pre-existing out of facility DNR order (yellow form or pink MOST form)  -  "MOST" Form in Place?  -      TOTAL TIME TAKING CARE OF THIS PATIENT: 38 minutes.    Note: This dictation was prepared with Dragon dictation along with smaller phrase technology. Any transcriptional errors that result from this process are unintentional.  Marlene Pfluger M.D on 06/24/2017 at 10:27 AM  Between 7am to 6pm - Pager - [phone] After 6pm go to [email or web address] - password [email or web address] [facility]  Office  [phone]  CC: Primary care physician; Kirk Ruths, MD

## 2017-06-24 NOTE — Telephone Encounter (Signed)
TCM  Per [facility] patient needs 1 wk fu A- Tach    Scheduled 6/12 (otherwise to far out with Thurmond Butts) at 1:30 pm

## 2017-06-27 NOTE — Telephone Encounter (Signed)
Patient contacted regarding discharge from [facility] on 06/24/17.   Patient understands to follow up with provider ? On 06/28/17 at 0900am at [place].  Patient understands discharge instructions? Yes   Patient understands medications and regiment? Yes   Patient understands to bring all medications to this visit? Yes

## 2017-06-28 ENCOUNTER — Encounter: Payer: Self-pay | Admitting: Internal Medicine

## 2017-06-28 ENCOUNTER — Ambulatory Visit (INDEPENDENT_AMBULATORY_CARE_PROVIDER_SITE_OTHER): Payer: Medicare HMO | Admitting: Internal Medicine

## 2017-06-28 VITALS — BP 128/70 | HR 74 | Ht 67.0 in | Wt 126.5 lb

## 2017-06-28 DIAGNOSIS — I4891 Unspecified atrial fibrillation: Secondary | ICD-10-CM

## 2017-06-28 DIAGNOSIS — I495 Sick sinus syndrome: Secondary | ICD-10-CM

## 2017-06-28 DIAGNOSIS — Z950 Presence of cardiac pacemaker: Secondary | ICD-10-CM | POA: Diagnosis not present

## 2017-06-28 NOTE — Progress Notes (Signed)
Patient Care Team: Kirk Ruths, MD as PCP - General (Unknown Physician Specialty)   HPI  Alejandra Wiley is a 84 y.o. female seen in followup for palpitations associated with atrial arrhythmias as well as bradycardia requiring backup bradycardia pacing. She takes flecainide   Her husband is no longer at home.    She is recently hospitalized for kyphoplasty.  Following the procedure she developed a tachycardia-ventricularly paced at the upper rate limit.  I recommended magnet; this apparently change the rhythm.  Device interrogation demonstrated atrial tach with subsequnet dissociation   Follow-up strips hospitalization demonstrated sinus rhythm.=  Following her procedure her back is better, but she iexceedingly fatigued       Echocardiogram 2103 demonstrated normal left ventricular function  Date Cr Hgb  8/18 0.67 12.2  9/18 0.64 10     Past Medical History:  Diagnosis Date  . Bradycardia   . 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 Pacemaker 12/06/2003   Medtronic Impulse DTDR01  . Pacemaker 2015  . PAF (paroxysmal atrial fibrillation) (HCC)    a. on Coumadin and flecainide; b. CHADS2VASc 3 (HTN, age x 2, female)  . Sick sinus syndrome ([place])   . Sinoatrial node dysfunction (HCC)   . Vaginal intraepithelial neoplasia     Past Surgical History:  Procedure Laterality Date  . APPENDECTOMY    . HERNIA REPAIR     lower abd  . IMAGE GUIDED SINUS SURGERY  2015  . INSERT / REPLACE / REMOVE PACEMAKER  12/06/2003   Medtronic impulse DTDR01  . JOINT REPLACEMENT Right    knee  . KYPHOPLASTY N/A 06/23/2017   Procedure: [id number];  Surgeon: Hessie Knows, MD;  Location: [facility] ORS;  Service: Orthopedics;  Laterality: N/A;  . LEEP  1999  . ORIF HIP FRACTURE Right 02/2016  . PACEMAKER GENERATOR CHANGE N/A 09/18/2012   Procedure: PACEMAKER GENERATOR CHANGE;  Surgeon: Deboraha Sprang, MD;  Location: [facility] CATH LAB;  Service: Cardiovascular;   Laterality: N/A;  . TONSILLECTOMY    . TOTAL KNEE ARTHROPLASTY    . TOTAL KNEE REVISION Right 09/23/2016   Procedure: TOTAL KNEE REVISION-POLYETHYLENE EXCHANGE;  Surgeon: Hessie Knows, MD;  Location: [facility] ORS;  Service: Orthopedics;  Laterality: Right;  Marland Kitchen VAGINAL HYSTERECTOMY      Current Outpatient Medications  Medication Sig Dispense Refill  . acetaminophen (TYLENOL) 500 MG tablet Take 500 mg by mouth every 8 (eight) hours as needed for mild pain or headache.    Marland Kitchen amLODipine (NORVASC) 5 MG tablet Take 5 mg by mouth daily.    . diphenhydrAMINE (BENADRYL) 25 mg capsule Take 25 mg by mouth every 8 (eight) hours as needed for itching. Takes along with Tylenol     . flecainide (TAMBOCOR) 50 MG tablet TAKE 1 AND 1/2 TABLETS TWICE DAILY 270 tablet 2  . fluticasone (FLONASE) 50 MCG/ACT nasal spray Place 2 sprays into both nostrils daily.    . Glucosamine HCl 1500 MG TABS Take 1,500 mg by mouth daily.    Marland Kitchen ibandronate (BONIVA) 150 MG tablet Take 150 mg by mouth every 30 (thirty) days. Take in the morning with a full glass of water, on an empty stomach, and do not take anything else by mouth or lie down for the next 30 min.    . Multiple Vitamin (MULTI VITAMIN DAILY PO) Take 1 tablet by mouth daily. Centrum silver    . nadolol (CORGARD) 20 MG tablet Take 20 mg  by mouth daily.     Marland Kitchen OVER THE COUNTER MEDICATION Place 1 spray into the nose daily. NeilMed Sinus Rinse    . warfarin (COUMADIN) 1 MG tablet Take 2 mg by mouth 2 (two) times daily.      No current facility-administered medications for this visit.     Allergies  Allergen Reactions  . Latex Other (See Comments)    blisters  . Other Other (See Comments)    Blisters/ paper tape is Market researcher paper tape is Market researcher paper tape is Ok   . Tape Other (See Comments)    Blisters/ paper tape is Ok  . Xarelto [Rivaroxaban] Other (See Comments)    Gums bleeding and too much bruising  . Nickel Rash and Other (See Comments)    Review of  Systems negative except from HPI and PMH  Physical Exam BP 128/70 (BP Location: Left Arm, Patient Position: Sitting, Cuff Size: Normal)   Pulse 74   Ht 5\' 7"  (1.702 m)   Wt 126 lb 8 oz (57.4 kg)   BMI 19.81 kg/m  Well developed and nourished in no acute distress HENT normal Neck supple with JVP-flat Clear Regular rate and rhythm, no murmurs or gallops Abd-soft with active BS No Clubbing cyanosis edema Skin-warm and dry A & Oriented  Grossly normal sensory and motor function   ECG AV pacing with a long AV delay Assessment and  Plan \ Atrial arrhtyhmia/Fib  Sinus node dysfunction  Hypertension  Pacemaker  Medtronic  The patient's device was interrogated.  The information was reviewed. No changes were made in the programming.    Her back is slowly getting better.  Her husband was moved to memory care.  This is been a blessing as well as difficulty.  Blood pressure well controlled  No bleeding  We spent more than 50% of our >25 min visit in face to face counseling regarding the above

## 2017-06-28 NOTE — Patient Instructions (Signed)
Medication Instructions: - Your physician recommends that you continue on your current medications as directed. Please refer to the Current Medication list given to you today.  Labwork: - none ordered  Procedures/Testing: - none ordered  Follow-Up: - Remote monitoring is used to monitor your Pacemaker of ICD from home. This monitoring reduces the number of office visits required to check your device to one time per year. It allows [place] to keep an eye on the functioning of your device to ensure it is working properly. You are scheduled for a device check from home on 07/20/17. You may send your transmission at any time that day. If you have a wireless device, the transmission will be sent automatically. After your physician reviews your transmission, you will receive a postcard with your next transmission date.  - Your physician wants you to follow-up in: October 2019 with Dr. Caryl Comes. You will receive a reminder letter in the mail two months in advance. If you don't receive a letter, please call our office to schedule the follow-up appointment.   Any Additional Special Instructions Will Be Listed Below (If Applicable).     If you need a refill on your cardiac medications before your next appointment, please call your pharmacy.

## 2017-06-29 ENCOUNTER — Ambulatory Visit: Payer: Medicare HMO | Admitting: Physician Assistant

## 2017-07-01 DIAGNOSIS — I480 Paroxysmal atrial fibrillation: Secondary | ICD-10-CM | POA: Diagnosis not present

## 2017-07-01 DIAGNOSIS — R05 Cough: Secondary | ICD-10-CM | POA: Diagnosis not present

## 2017-07-08 DIAGNOSIS — M4854XA Collapsed vertebra, not elsewhere classified, thoracic region, initial encounter for fracture: Secondary | ICD-10-CM | POA: Diagnosis not present

## 2017-07-11 LAB — CUP PACEART INCLINIC DEVICE CHECK
Battery Remaining Longevity: 88 mo
Brady Statistic AS VP Percent: 0 %
Brady Statistic AS VS Percent: 29 %
Implantable Lead Implant Date: 20051130
Implantable Pulse Generator Implant Date: 20140901
Lead Channel Pacing Threshold Amplitude: 0.5 V
Lead Channel Pacing Threshold Pulse Width: 0.4 ms
Lead Channel Pacing Threshold Pulse Width: 0.4 ms
Lead Channel Setting Pacing Amplitude: 2 V
Lead Channel Setting Pacing Amplitude: 2.5 V
Lead Channel Setting Pacing Pulse Width: 0.4 ms
Lead Channel Setting Sensing Sensitivity: 4 mV
MDC IDC LEAD IMPLANT DT: 20051130
MDC IDC LEAD LOCATION: 753859
MDC IDC LEAD LOCATION: 753860
MDC IDC MSMT BATTERY IMPEDANCE: 455 Ohm
MDC IDC MSMT BATTERY VOLTAGE: 2.78 V
MDC IDC MSMT LEADCHNL RA IMPEDANCE VALUE: 570 Ohm
MDC IDC MSMT LEADCHNL RA PACING THRESHOLD AMPLITUDE: 0.75 V
MDC IDC MSMT LEADCHNL RA SENSING INTR AMPL: 2 mV
MDC IDC MSMT LEADCHNL RV IMPEDANCE VALUE: 620 Ohm
MDC IDC MSMT LEADCHNL RV SENSING INTR AMPL: 15.67 mV
MDC IDC SESS DTM: 20190611133637
MDC IDC STAT BRADY AP VP PERCENT: 68 %
MDC IDC STAT BRADY AP VS PERCENT: 3 %

## 2017-07-18 ENCOUNTER — Encounter: Payer: Self-pay | Admitting: Family

## 2017-07-18 ENCOUNTER — Ambulatory Visit: Payer: Medicare HMO | Attending: Family | Admitting: Family

## 2017-07-18 VITALS — BP 137/86 | HR 81 | Resp 18 | Ht 67.0 in | Wt 125.1 lb

## 2017-07-18 DIAGNOSIS — J328 Other chronic sinusitis: Secondary | ICD-10-CM | POA: Insufficient documentation

## 2017-07-18 DIAGNOSIS — R42 Dizziness and giddiness: Secondary | ICD-10-CM | POA: Insufficient documentation

## 2017-07-18 DIAGNOSIS — R002 Palpitations: Secondary | ICD-10-CM | POA: Insufficient documentation

## 2017-07-18 DIAGNOSIS — Z950 Presence of cardiac pacemaker: Secondary | ICD-10-CM | POA: Insufficient documentation

## 2017-07-18 DIAGNOSIS — Z9889 Other specified postprocedural states: Secondary | ICD-10-CM | POA: Diagnosis not present

## 2017-07-18 DIAGNOSIS — Z8542 Personal history of malignant neoplasm of other parts of uterus: Secondary | ICD-10-CM | POA: Insufficient documentation

## 2017-07-18 DIAGNOSIS — R5383 Other fatigue: Secondary | ICD-10-CM | POA: Diagnosis not present

## 2017-07-18 DIAGNOSIS — I495 Sick sinus syndrome: Secondary | ICD-10-CM | POA: Insufficient documentation

## 2017-07-18 DIAGNOSIS — I110 Hypertensive heart disease with heart failure: Secondary | ICD-10-CM | POA: Insufficient documentation

## 2017-07-18 DIAGNOSIS — Z888 Allergy status to other drugs, medicaments and biological substances status: Secondary | ICD-10-CM | POA: Insufficient documentation

## 2017-07-18 DIAGNOSIS — I5032 Chronic diastolic (congestive) heart failure: Secondary | ICD-10-CM | POA: Diagnosis not present

## 2017-07-18 DIAGNOSIS — I10 Essential (primary) hypertension: Secondary | ICD-10-CM

## 2017-07-18 DIAGNOSIS — Z7901 Long term (current) use of anticoagulants: Secondary | ICD-10-CM | POA: Insufficient documentation

## 2017-07-18 DIAGNOSIS — Z79899 Other long term (current) drug therapy: Secondary | ICD-10-CM | POA: Insufficient documentation

## 2017-07-18 DIAGNOSIS — I480 Paroxysmal atrial fibrillation: Secondary | ICD-10-CM | POA: Diagnosis not present

## 2017-07-18 DIAGNOSIS — Z96651 Presence of right artificial knee joint: Secondary | ICD-10-CM | POA: Insufficient documentation

## 2017-07-18 NOTE — Patient Instructions (Signed)
Continue weighing daily and call for an overnight weight gain of > 2 pounds or a weekly weight gain of >5 pounds. 

## 2017-07-18 NOTE — Progress Notes (Signed)
Patient ID: Alejandra Wiley, female    DOB: 1932-09-01, 84 y.o.   MRN: [id number]  HPI  [name] is a 84 y/o female with a history of vaginal cancer, sick sinus syndrome, paroxysmal atrial fibrillation, HTN and chronic heart failure.   Echo report from 06/24/17 reviewed and showed an EF of 50-55% along with mild/moderate MR and a mildly elevated PA pressure of 44 mm Hg.   Admitted 06/23/17 due to atrial tachycardia and pulmonary edema after kyphoplasty of T-9 on 06/23/17. Cardiology consult obtained. Discharged the following day.   She presents today for her initial visit with a chief complaint of moderate fatigue upon minimal exertion. She has associated head congestion (due to chronic fungal sinus infection), light-headedness, pedal edema, palpitations, and difficulty sleeping along with this. She denies any abdominal distention, chest pain, shortness of breath or weight gain. Says that prior to recent hospitalization, she was doing yoga 2x / week and walking 1 mile 4-5 days / week and now she takes 2 naps a day and sleeps for 8-9 hours a night. Has had to recently place her husband in the memory care unit at [facility] and is adjusting to that. Took care of him for many years prior to this.   Past Medical History:  Diagnosis Date  . Bradycardia   . Cancer ([place])    vaginal intraepithelial neoplasia  . CHF (congestive heart failure) ([place])   . Hypertension   . Pacemaker 12/06/2003   Medtronic Impulse DTDR01  . Pacemaker 2015  . PAF (paroxysmal atrial fibrillation) ([facility])    a. on Coumadin and flecainide; b. CHADS2VASc 3 (HTN, age x 2, female)  . Sick sinus syndrome ([place])   . Sinoatrial node dysfunction ([facility])   . Vaginal intraepithelial neoplasia    Past Surgical History:  Procedure Laterality Date  . APPENDECTOMY    . HERNIA REPAIR     lower abd  . IMAGE GUIDED SINUS SURGERY  2015  . INSERT / REPLACE / REMOVE PACEMAKER  12/06/2003   Medtronic impulse DTDR01  . JOINT REPLACEMENT Right     knee  . KYPHOPLASTY N/A 06/23/2017   Procedure: [id number];  Surgeon: Hessie Knows, MD;  Location: [facility] ORS;  Service: Orthopedics;  Laterality: N/A;  . LEEP  1999  . ORIF HIP FRACTURE Right 02/2016  . PACEMAKER GENERATOR CHANGE N/A 09/18/2012   Procedure: PACEMAKER GENERATOR CHANGE;  Surgeon: Deboraha Sprang, MD;  Location: [facility] CATH LAB;  Service: Cardiovascular;  Laterality: N/A;  . TONSILLECTOMY    . TOTAL KNEE ARTHROPLASTY    . TOTAL KNEE REVISION Right 09/23/2016   Procedure: TOTAL KNEE REVISION-POLYETHYLENE EXCHANGE;  Surgeon: Hessie Knows, MD;  Location: [facility] ORS;  Service: Orthopedics;  Laterality: Right;  Marland Kitchen VAGINAL HYSTERECTOMY     Family History  Problem Relation Age of Onset  . Stroke Mother   . Heart disease Father   . Diabetes Daughter   . Diabetes Son    Social History   Tobacco Use  . Smoking status: Never Smoker  . Smokeless tobacco: Never Used  Substance Use Topics  . Alcohol use: Yes    Alcohol/week: 0.6 oz    Types: 1 Glasses of wine per week    Comment: occasionally   Allergies  Allergen Reactions  . Latex Other (See Comments)    blisters  . Other Other (See Comments)    Blisters/ paper tape is Market researcher paper tape is Market researcher paper tape is Ok   . Tape  Other (See Comments)    Blisters/ paper tape is Ok  . Xarelto [Rivaroxaban] Other (See Comments)    Gums bleeding and too much bruising  . Nickel Rash and Other (See Comments)   Prior to Admission medications   Medication Sig Start Date End Date Taking? Authorizing Provider  acetaminophen (TYLENOL) 500 MG tablet Take 500 mg by mouth every 8 (eight) hours as needed for mild pain or headache.   Yes [provider]  amLODipine (NORVASC) 5 MG tablet Take 5 mg by mouth daily.   Yes [provider]  diphenhydrAMINE (BENADRYL) 25 mg capsule Take 25 mg by mouth every 8 (eight) hours as needed for itching. Takes along with Tylenol    Yes [provider]  flecainide  (TAMBOCOR) 50 MG tablet TAKE 1 AND 1/2 TABLETS TWICE DAILY 03/08/17  Yes Deboraha Sprang, MD  fluticasone [facility]) 50 MCG/ACT nasal spray Place 2 sprays into both nostrils daily.   Yes [provider]  Glucosamine HCl 1500 MG TABS Take 1,500 mg by mouth daily.   Yes [provider]  ibandronate (BONIVA) 150 MG tablet Take 150 mg by mouth every 30 (thirty) days. Take in the morning with a full glass of water, on an empty stomach, and do not take anything else by mouth or lie down for the next 30 min.   Yes [provider]  Multiple Vitamin (MULTI VITAMIN DAILY PO) Take 1 tablet by mouth daily. Centrum silver   Yes [provider]  nadolol (CORGARD) 20 MG tablet Take 20 mg by mouth daily.    Yes [provider]  OVER THE COUNTER MEDICATION Place 1 spray into the nose daily. NeilMed Sinus Rinse   Yes [provider]  warfarin (COUMADIN) 1 MG tablet Take 2 mg by mouth 2 (two) times daily.    Yes [provider]   Review of Systems  Constitutional: Positive for fatigue (tire easily).  HENT: Positive for congestion (left sinus congestion). Negative for postnasal drip and sore throat.   Eyes: Negative.   Respiratory: Negative for chest tightness and shortness of breath.   Cardiovascular: Positive for palpitations and leg swelling (minimal). Negative for chest pain.  Gastrointestinal: Negative for abdominal distention and abdominal pain.  Endocrine: Negative.   Genitourinary: Negative.   Musculoskeletal: Positive for arthralgias (both legs hurt) and back pain.  Skin: Negative.        Varicose veins (new)  Allergic/Immunologic: Negative.   Neurological: Positive for light-headedness. Negative for dizziness.  Hematological: Negative for adenopathy. Does not bruise/bleed easily.  Psychiatric/Behavioral: Positive for sleep disturbance (sleeping 8-9 hours/ night along with 2 naps daily). Negative for dysphoric mood. The patient is  nervous/anxious.     Vitals:   07/18/17 1239  BP: 137/86  Pulse: 81  Resp: 18  SpO2: 97%  Weight: 125 lb 2 oz (56.8 kg)  Height: 5\' 7"  (1.702 m)   Wt Readings from Last 3 Encounters:  07/18/17 125 lb 2 oz (56.8 kg)  06/28/17 126 lb 8 oz (57.4 kg)  06/23/17 135 lb (61.2 kg)   Lab Results  Component Value Date   CREATININE 0.83 06/24/2017   CREATININE 0.73 06/23/2017   CREATININE 0.64 09/24/2016   Physical Exam  Constitutional: She is oriented to person, place, and time. She appears well-developed and well-nourished.  HENT:  Head: Normocephalic and atraumatic.  Neck: Normal range of motion. Neck supple. No JVD present.  Cardiovascular: Normal rate and regular rhythm.  Pulmonary/Chest: Effort normal. No  respiratory distress. She has no wheezes. She has no rhonchi. She has no rales.  Abdominal: Soft. Bowel sounds are normal.  Musculoskeletal:       Right lower leg: She exhibits edema (trace pitting). She exhibits no tenderness.       Left lower leg: She exhibits edema (trace pitting). She exhibits no tenderness.  Neurological: She is alert and oriented to person, place, and time.  Skin: Skin is warm and dry.  Psychiatric: She has a normal mood and affect. Her behavior is normal.  Nursing note and vitals reviewed.  Assessment & Plan:  1: Chronic heart failure with preserved ejection fraction- - NYHA class III - minimally fluid overloaded today - weighing daily and says that her weight has been stable. Instructed to call for an overnight weight gain of >2 pounds or a weekly weight gain of >5 pounds - not adding salt to her food. Discussed the importance of reading food labels and closely following a 2000mg  sodium diet. Written information was given to her about this - is quite concerned about how fatigued she is. Explained that HF/ atrial fibrillation can stress the heart and cause fatigue. Encouraged her to slowly try to increase her activity allowing for frequent rest  periods. Her years of taking care of her husband may have also caught up with her  2: Paroxymal atrial fibrillation- - saw EP Caryl Comes) 06/28/17 - pacemaker present - on flecainide - is hoping to feel well enough to attend grandson's wedding in October  3: HTN- - BP looks good today - saw PCP Ouida Sills) 05/25/17 - BMP 06/24/17 reviewed and showed sodium 134, potassium 3.5 and GFR >60  Medication list was reviewed.   Return in 1 month or sooner for any questions/problems before then.

## 2017-07-19 ENCOUNTER — Encounter: Payer: Self-pay | Admitting: Family

## 2017-07-19 DIAGNOSIS — I10 Essential (primary) hypertension: Secondary | ICD-10-CM | POA: Insufficient documentation

## 2017-07-19 DIAGNOSIS — I5032 Chronic diastolic (congestive) heart failure: Secondary | ICD-10-CM | POA: Insufficient documentation

## 2017-07-20 ENCOUNTER — Ambulatory Visit (INDEPENDENT_AMBULATORY_CARE_PROVIDER_SITE_OTHER): Payer: Medicare HMO | Admitting: *Deleted

## 2017-07-20 ENCOUNTER — Telehealth: Payer: Self-pay | Admitting: Cardiology

## 2017-07-20 DIAGNOSIS — I495 Sick sinus syndrome: Secondary | ICD-10-CM | POA: Diagnosis not present

## 2017-07-20 NOTE — Telephone Encounter (Signed)
Spoke with pt and reminded pt of remote transmission that is due today. Pt verbalized understanding.   

## 2017-07-22 ENCOUNTER — Encounter: Payer: Self-pay | Admitting: Cardiology

## 2017-07-22 NOTE — Progress Notes (Signed)
Remote pacemaker transmission.   

## 2017-08-02 DIAGNOSIS — I480 Paroxysmal atrial fibrillation: Secondary | ICD-10-CM | POA: Diagnosis not present

## 2017-08-08 ENCOUNTER — Telehealth: Payer: Self-pay | Admitting: Family

## 2017-08-08 NOTE — Telephone Encounter (Signed)
Patient called to say that her HR has been running mid to upper 90's over the last few days. Her BP has also been trending up and this morning before she took her medications, her BP was 159/113 and a few hours after taking her medications, it was 150/102.  Weight has been stable and no swelling that she is aware of.   Instructed her to increase her amlodipine to 10mg  daily. She can take 2 of her current tablets daily. She is to continue checking her HR/ BP daily and call us back in a few days to update on readings.

## 2017-08-11 ENCOUNTER — Telehealth: Payer: Self-pay

## 2017-08-11 LAB — CUP PACEART REMOTE DEVICE CHECK
Battery Impedance: 481 Ohm
Brady Statistic AP VP Percent: 87 %
Brady Statistic AS VP Percent: 3 %
Brady Statistic AS VS Percent: 10 %
Date Time Interrogation Session: 20190704143014
Implantable Lead Location: 753860
Implantable Pulse Generator Implant Date: 20140901
Lead Channel Impedance Value: 570 Ohm
Lead Channel Setting Pacing Amplitude: 2 V
Lead Channel Setting Pacing Amplitude: 2.5 V
Lead Channel Setting Pacing Pulse Width: 0.4 ms
MDC IDC LEAD IMPLANT DT: 20051130
MDC IDC LEAD IMPLANT DT: 20051130
MDC IDC LEAD LOCATION: 753859
MDC IDC MSMT BATTERY REMAINING LONGEVITY: 81 mo
MDC IDC MSMT BATTERY VOLTAGE: 2.78 V
MDC IDC MSMT LEADCHNL RV IMPEDANCE VALUE: 627 Ohm
MDC IDC MSMT LEADCHNL RV PACING THRESHOLD AMPLITUDE: 0.875 V
MDC IDC MSMT LEADCHNL RV PACING THRESHOLD PULSEWIDTH: 0.4 ms
MDC IDC SET LEADCHNL RV SENSING SENSITIVITY: 4 mV
MDC IDC STAT BRADY AP VS PERCENT: 0 %

## 2017-08-11 NOTE — Telephone Encounter (Signed)
Alejandra Wiley called to report that her heart rate is still elevated ( upper 90'S) and she is concerned. Past readings show her HR in the mid 70 range.  She states that her blood pressure is better 122/78 today after her medications.   She does however admit to having some lightheadedness and thinks she would feel better if she was seen. Appointment made for 7/26 at 10:20 and patient was advised to bring her medication with her for review.

## 2017-08-12 ENCOUNTER — Ambulatory Visit: Payer: Medicare HMO | Attending: Family | Admitting: Family

## 2017-08-12 ENCOUNTER — Encounter: Payer: Self-pay | Admitting: Family

## 2017-08-12 VITALS — BP 125/75 | HR 87 | Resp 18 | Ht 66.0 in | Wt 125.5 lb

## 2017-08-12 DIAGNOSIS — Z833 Family history of diabetes mellitus: Secondary | ICD-10-CM | POA: Diagnosis not present

## 2017-08-12 DIAGNOSIS — I110 Hypertensive heart disease with heart failure: Secondary | ICD-10-CM | POA: Insufficient documentation

## 2017-08-12 DIAGNOSIS — Z79899 Other long term (current) drug therapy: Secondary | ICD-10-CM | POA: Diagnosis not present

## 2017-08-12 DIAGNOSIS — Z823 Family history of stroke: Secondary | ICD-10-CM | POA: Diagnosis not present

## 2017-08-12 DIAGNOSIS — Z96659 Presence of unspecified artificial knee joint: Secondary | ICD-10-CM | POA: Insufficient documentation

## 2017-08-12 DIAGNOSIS — I480 Paroxysmal atrial fibrillation: Secondary | ICD-10-CM | POA: Insufficient documentation

## 2017-08-12 DIAGNOSIS — Z09 Encounter for follow-up examination after completed treatment for conditions other than malignant neoplasm: Secondary | ICD-10-CM | POA: Insufficient documentation

## 2017-08-12 DIAGNOSIS — Z91040 Latex allergy status: Secondary | ICD-10-CM | POA: Diagnosis not present

## 2017-08-12 DIAGNOSIS — Z9109 Other allergy status, other than to drugs and biological substances: Secondary | ICD-10-CM | POA: Insufficient documentation

## 2017-08-12 DIAGNOSIS — I495 Sick sinus syndrome: Secondary | ICD-10-CM | POA: Insufficient documentation

## 2017-08-12 DIAGNOSIS — Z8249 Family history of ischemic heart disease and other diseases of the circulatory system: Secondary | ICD-10-CM | POA: Insufficient documentation

## 2017-08-12 DIAGNOSIS — Z7983 Long term (current) use of bisphosphonates: Secondary | ICD-10-CM | POA: Insufficient documentation

## 2017-08-12 DIAGNOSIS — Z888 Allergy status to other drugs, medicaments and biological substances status: Secondary | ICD-10-CM | POA: Insufficient documentation

## 2017-08-12 DIAGNOSIS — Z9889 Other specified postprocedural states: Secondary | ICD-10-CM | POA: Diagnosis not present

## 2017-08-12 DIAGNOSIS — Z7901 Long term (current) use of anticoagulants: Secondary | ICD-10-CM | POA: Insufficient documentation

## 2017-08-12 DIAGNOSIS — I10 Essential (primary) hypertension: Secondary | ICD-10-CM

## 2017-08-12 DIAGNOSIS — I5032 Chronic diastolic (congestive) heart failure: Secondary | ICD-10-CM | POA: Insufficient documentation

## 2017-08-12 DIAGNOSIS — F419 Anxiety disorder, unspecified: Secondary | ICD-10-CM | POA: Diagnosis not present

## 2017-08-12 NOTE — Patient Instructions (Signed)
Continue weighing daily and call for an overnight weight gain of > 2 pounds or a weekly weight gain of >5 pounds. 

## 2017-08-12 NOTE — Progress Notes (Signed)
Patient ID: Alejandra Wiley, female    DOB: 1932-05-25, 84 y.o.   MRN: [id number]  HPI  [name] is a 84 y/o female with a history of vaginal cancer, sick sinus syndrome, paroxysmal atrial fibrillation, HTN and chronic heart failure.   Echo report from 06/24/17 reviewed and showed an EF of 50-55% along with mild/moderate MR and a mildly elevated PA pressure of 44 mm Hg.   Admitted 06/23/17 due to atrial tachycardia and pulmonary edema after kyphoplasty of T-9 on 06/23/17. Cardiology consult obtained. Discharged the following day.   She presents today for a follow-up visit with a chief complaint of moderate fatigue upon minimal exertion. She describes this as being present since her back surgery early June 2019. She says that she sleeps well at night and then takes a long nap in the morning and another long nap in the afternoon. She feels like she's sleeping ~ 12 hours/ day. She has associated shortness of breath, pedal edema, palpitations, light-headedness and anxiety along with this. She denies any abdominal distention, chest pain or weight gain.   Past Medical History:  Diagnosis Date  . Bradycardia   . Cancer ([place])    vaginal intraepithelial neoplasia  . CHF (congestive heart failure) ([place])   . Hypertension   . Pacemaker 12/06/2003   Medtronic Impulse DTDR01  . Pacemaker 2015  . PAF (paroxysmal atrial fibrillation) ([facility])    a. on Coumadin and flecainide; b. CHADS2VASc 3 (HTN, age x 2, female)  . Sick sinus syndrome ([place])   . Sinoatrial node dysfunction ([facility])   . Vaginal intraepithelial neoplasia    Past Surgical History:  Procedure Laterality Date  . APPENDECTOMY    . HERNIA REPAIR     lower abd  . IMAGE GUIDED SINUS SURGERY  2015  . INSERT / REPLACE / REMOVE PACEMAKER  12/06/2003   Medtronic impulse DTDR01  . JOINT REPLACEMENT Right    knee  . KYPHOPLASTY N/A 06/23/2017   Procedure: [id number];  Surgeon: Hessie Knows, MD;  Location: [facility] ORS;  Service: Orthopedics;   Laterality: N/A;  . LEEP  1999  . ORIF HIP FRACTURE Right 02/2016  . PACEMAKER GENERATOR CHANGE N/A 09/18/2012   Procedure: PACEMAKER GENERATOR CHANGE;  Surgeon: Deboraha Sprang, MD;  Location: [facility] CATH LAB;  Service: Cardiovascular;  Laterality: N/A;  . TONSILLECTOMY    . TOTAL KNEE ARTHROPLASTY    . TOTAL KNEE REVISION Right 09/23/2016   Procedure: TOTAL KNEE REVISION-POLYETHYLENE EXCHANGE;  Surgeon: Hessie Knows, MD;  Location: [facility] ORS;  Service: Orthopedics;  Laterality: Right;  Marland Kitchen VAGINAL HYSTERECTOMY     Family History  Problem Relation Age of Onset  . Stroke Mother   . Heart disease Father   . Diabetes Daughter   . Diabetes Son    Social History   Tobacco Use  . Smoking status: Never Smoker  . Smokeless tobacco: Never Used  Substance Use Topics  . Alcohol use: Yes    Alcohol/week: 0.6 oz    Types: 1 Glasses of wine per week    Comment: occasionally   Allergies  Allergen Reactions  . Latex Other (See Comments)    blisters  . Other Other (See Comments)    Blisters/ paper tape is Market researcher paper tape is Market researcher paper tape is Ok   . Tape Other (See Comments)    Blisters/ paper tape is Ok  . Xarelto [Rivaroxaban] Other (See Comments)    Gums bleeding and too much bruising  .  Nickel Rash and Other (See Comments)   Prior to Admission medications   Medication Sig Start Date End Date Taking? Authorizing Provider  acetaminophen (TYLENOL) 500 MG tablet Take 500 mg by mouth every 8 (eight) hours as needed for mild pain or headache.   Yes [provider]  amLODipine (NORVASC) 5 MG tablet Take 10 mg by mouth daily.    Yes [provider]  diphenhydrAMINE (BENADRYL) 25 mg capsule Take 25 mg by mouth at bedtime as needed for sleep. Takes along with Tylenol    Yes [provider]  flecainide (TAMBOCOR) 50 MG tablet TAKE 1 AND 1/2 TABLETS TWICE DAILY 03/08/17  Yes Deboraha Sprang, MD  fluticasone [facility]) 50 MCG/ACT nasal spray Place 2 sprays into  both nostrils daily.   Yes [provider]  Glucosamine HCl 1500 MG TABS Take 1,500 mg by mouth daily.   Yes [provider]  ibandronate (BONIVA) 150 MG tablet Take 150 mg by mouth every 30 (thirty) days. Take in the morning with a full glass of water, on an empty stomach, and do not take anything else by mouth or lie down for the next 30 min.   Yes [provider]  Multiple Vitamin (MULTI VITAMIN DAILY PO) Take 1 tablet by mouth daily. Centrum silver   Yes [provider]  nadolol (CORGARD) 20 MG tablet Take 20 mg by mouth daily.    Yes [provider]  OVER THE COUNTER MEDICATION Place 1 spray into the nose daily. NeilMed Sinus Rinse   Yes [provider]  warfarin (COUMADIN) 1 MG tablet Take 2 mg by mouth 2 (two) times daily.    Yes [provider]    Review of Systems  Constitutional: Positive for fatigue (tire easily).  HENT: Positive for congestion (left sinus congestion). Negative for postnasal drip and sore throat.   Eyes: Negative.   Respiratory: Positive for shortness of breath (when walking long distances). Negative for chest tightness.   Cardiovascular: Positive for palpitations (at times) and leg swelling (minimal). Negative for chest pain.  Gastrointestinal: Negative for abdominal distention and abdominal pain.  Endocrine: Negative.   Genitourinary: Negative.   Musculoskeletal: Positive for arthralgias (both legs hurt) and back pain.  Skin: Negative.        Varicose veins (new)  Allergic/Immunologic: Negative.   Neurological: Positive for light-headedness. Negative for dizziness.  Hematological: Negative for adenopathy. Does not bruise/bleed easily.  Psychiatric/Behavioral: Positive for sleep disturbance (sleeping 8-9 hours/ night along with 2 naps daily). Negative for dysphoric mood. The patient is nervous/anxious.    Vitals:   08/12/17 1040  BP: 125/75  Pulse: 87  Resp: 18  SpO2: 98%  Weight: 125 lb 8 oz  (56.9 kg)  Height: 5\' 6"  (1.676 m)   Wt Readings from Last 3 Encounters:  08/12/17 125 lb 8 oz (56.9 kg)  07/18/17 125 lb 2 oz (56.8 kg)  06/28/17 126 lb 8 oz (57.4 kg)   Lab Results  Component Value Date   CREATININE 0.83 06/24/2017   CREATININE 0.73 06/23/2017   CREATININE 0.64 09/24/2016    Physical Exam  Constitutional: She is oriented to person, place, and time. She appears well-developed and well-nourished.  HENT:  Head: Normocephalic and atraumatic.  Neck: Normal range of motion. Neck supple. No JVD present.  Cardiovascular: Normal rate and regular rhythm.  Pulmonary/Chest: Effort normal. No respiratory distress. She has no wheezes. She has no rhonchi. She has no rales.  Abdominal: Soft. Bowel sounds are  normal.  Musculoskeletal:       Right lower leg: She exhibits edema (trace pitting). She exhibits no tenderness.       Left lower leg: She exhibits edema (trace pitting). She exhibits no tenderness.  Neurological: She is alert and oriented to person, place, and time.  Skin: Skin is warm and dry.  Psychiatric: She has a normal mood and affect. Her behavior is normal.  Nursing note and vitals reviewed.  Assessment & Plan:  1: Chronic heart failure with preserved ejection fraction- - NYHA class III - euvolemic today - weighing daily and says that her weight has been stable. Reminded to call for an overnight weight gain of >2 pounds or a weekly weight gain of >5 pounds - weight stable from last time she was here 3 weeks ago - not adding salt to her food. Reviewed the importance of reading food labels and closely following a 2000mg  sodium diet.  - is quite concerned about how fatigued she is. Explained that HF/ atrial fibrillation can stress the heart and cause fatigue. Encouraged her to slowly try to increase her activity allowing for frequent rest periods. Her years of taking care of her husband may have also caught up with her - will contact her PCP about possibly  checking a CBC & TSH in regards to her fatigue - PharmD reconciled medications with the patient  2: Paroxymal atrial fibrillation- - saw EP Caryl Comes) 06/28/17 - will contact Dr. Caryl Comes about nadolol use to see if that could be stopped or adjusted - pacemaker present - on flecainide - is hoping to feel well enough to attend grandson's wedding in October  3: HTN- - BP looks good today - saw PCP Ouida Sills) 07/01/17 - BMP 06/24/17 reviewed and showed sodium 134, potassium 3.5 and GFR >60  Medication list was reviewed.   Return in 1 month or sooner for any questions/problems before then.

## 2017-08-15 ENCOUNTER — Telehealth: Payer: Self-pay | Admitting: Family

## 2017-08-15 NOTE — Telephone Encounter (Signed)
Spoke with patient regarding fatigue and nadolol usage. Had messaged Dr. Caryl Comes who said we could try weaning patient off of nadolol to see if that improves her fatigue. Patient says that she has a pill cutter so will cut her pills in half and take 1/2 tablet daily.   Advised her to check her BP and HR on a daily basis and let us know if either starts to increase.   She said that she had heard from PCP's office and that her PCP would be making her an appointment to discuss her fatigue and medication usage.

## 2017-08-19 DIAGNOSIS — I480 Paroxysmal atrial fibrillation: Secondary | ICD-10-CM | POA: Diagnosis not present

## 2017-08-19 DIAGNOSIS — R5383 Other fatigue: Secondary | ICD-10-CM | POA: Diagnosis not present

## 2017-08-23 DIAGNOSIS — N10 Acute tubulo-interstitial nephritis: Secondary | ICD-10-CM | POA: Diagnosis not present

## 2017-08-23 DIAGNOSIS — R509 Fever, unspecified: Secondary | ICD-10-CM | POA: Diagnosis not present

## 2017-08-23 DIAGNOSIS — R112 Nausea with vomiting, unspecified: Secondary | ICD-10-CM | POA: Diagnosis not present

## 2017-08-26 ENCOUNTER — Emergency Department: Payer: Medicare HMO

## 2017-08-26 ENCOUNTER — Observation Stay
Admission: EM | Admit: 2017-08-26 | Discharge: 2017-08-27 | Disposition: A | Discharge status: Home / Self Care | Payer: Medicare HMO | Attending: Internal Medicine | Admitting: Internal Medicine

## 2017-08-26 ENCOUNTER — Other Ambulatory Visit: Payer: Self-pay

## 2017-08-26 ENCOUNTER — Encounter: Payer: Self-pay | Admitting: Emergency Medicine

## 2017-08-26 DIAGNOSIS — W19XXXA Unspecified fall, initial encounter: Secondary | ICD-10-CM | POA: Diagnosis not present

## 2017-08-26 DIAGNOSIS — Z7901 Long term (current) use of anticoagulants: Secondary | ICD-10-CM | POA: Diagnosis not present

## 2017-08-26 DIAGNOSIS — I495 Sick sinus syndrome: Secondary | ICD-10-CM | POA: Insufficient documentation

## 2017-08-26 DIAGNOSIS — N12 Tubulo-interstitial nephritis, not specified as acute or chronic: Secondary | ICD-10-CM | POA: Diagnosis not present

## 2017-08-26 DIAGNOSIS — I10 Essential (primary) hypertension: Secondary | ICD-10-CM | POA: Diagnosis present

## 2017-08-26 DIAGNOSIS — S0990XA Unspecified injury of head, initial encounter: Secondary | ICD-10-CM | POA: Diagnosis not present

## 2017-08-26 DIAGNOSIS — M4050 Lordosis, unspecified, site unspecified: Secondary | ICD-10-CM | POA: Diagnosis not present

## 2017-08-26 DIAGNOSIS — Z8589 Personal history of malignant neoplasm of other organs and systems: Secondary | ICD-10-CM | POA: Insufficient documentation

## 2017-08-26 DIAGNOSIS — M6281 Muscle weakness (generalized): Secondary | ICD-10-CM | POA: Diagnosis not present

## 2017-08-26 DIAGNOSIS — I4891 Unspecified atrial fibrillation: Secondary | ICD-10-CM | POA: Diagnosis present

## 2017-08-26 DIAGNOSIS — N10 Acute tubulo-interstitial nephritis: Secondary | ICD-10-CM | POA: Diagnosis not present

## 2017-08-26 DIAGNOSIS — Z823 Family history of stroke: Secondary | ICD-10-CM | POA: Diagnosis not present

## 2017-08-26 DIAGNOSIS — R2689 Other abnormalities of gait and mobility: Secondary | ICD-10-CM | POA: Diagnosis not present

## 2017-08-26 DIAGNOSIS — R262 Difficulty in walking, not elsewhere classified: Secondary | ICD-10-CM | POA: Diagnosis not present

## 2017-08-26 DIAGNOSIS — Z950 Presence of cardiac pacemaker: Secondary | ICD-10-CM | POA: Insufficient documentation

## 2017-08-26 DIAGNOSIS — I110 Hypertensive heart disease with heart failure: Secondary | ICD-10-CM | POA: Diagnosis not present

## 2017-08-26 DIAGNOSIS — Z79899 Other long term (current) drug therapy: Secondary | ICD-10-CM | POA: Insufficient documentation

## 2017-08-26 DIAGNOSIS — Z91040 Latex allergy status: Secondary | ICD-10-CM | POA: Diagnosis not present

## 2017-08-26 DIAGNOSIS — S199XXA Unspecified injury of neck, initial encounter: Secondary | ICD-10-CM | POA: Diagnosis not present

## 2017-08-26 DIAGNOSIS — I5032 Chronic diastolic (congestive) heart failure: Secondary | ICD-10-CM | POA: Diagnosis present

## 2017-08-26 DIAGNOSIS — I480 Paroxysmal atrial fibrillation: Secondary | ICD-10-CM | POA: Insufficient documentation

## 2017-08-26 DIAGNOSIS — Z888 Allergy status to other drugs, medicaments and biological substances status: Secondary | ICD-10-CM | POA: Diagnosis not present

## 2017-08-26 DIAGNOSIS — Z96651 Presence of right artificial knee joint: Secondary | ICD-10-CM | POA: Diagnosis not present

## 2017-08-26 DIAGNOSIS — Z7951 Long term (current) use of inhaled steroids: Secondary | ICD-10-CM | POA: Insufficient documentation

## 2017-08-26 LAB — CBC
HCT: 28.9 % — ABNORMAL LOW (ref 35.0–47.0)
HEMATOCRIT: 29.6 % — AB (ref 35.0–47.0)
Hemoglobin: 10.3 g/dL — ABNORMAL LOW (ref 12.0–16.0)
Hemoglobin: 9.9 g/dL — ABNORMAL LOW (ref 12.0–16.0)
MCH: 31.1 pg (ref 26.0–34.0)
MCH: 30.7 pg (ref 26.0–34.0)
MCHC: 34.7 g/dL (ref 32.0–36.0)
MCHC: 34.1 g/dL (ref 32.0–36.0)
MCV: 89.6 fL (ref 80.0–100.0)
MCV: 89.9 fL (ref 80.0–100.0)
PLATELETS: 232 10*3/uL (ref 150–440)
Platelets: 235 10*3/uL (ref 150–440)
RBC: 3.3 MIL/uL — ABNORMAL LOW (ref 3.80–5.20)
RBC: 3.22 MIL/uL — ABNORMAL LOW (ref 3.80–5.20)
RDW: 14.5 % (ref 11.5–14.5)
RDW: 14.7 % — AB (ref 11.5–14.5)
WBC: 10.3 10*3/uL (ref 3.6–11.0)
WBC: 10.1 10*3/uL (ref 3.6–11.0)

## 2017-08-26 LAB — BASIC METABOLIC PANEL
Anion gap: 6 (ref 5–15)
BUN: 15 mg/dL (ref 8–23)
CALCIUM: 8.1 mg/dL — AB (ref 8.9–10.3)
CO2: 27 mmol/L (ref 22–32)
Chloride: 101 mmol/L (ref 98–111)
Creatinine, Ser: 0.66 mg/dL (ref 0.44–1.00)
GFR calc Af Amer: >60 mL/min (ref >60)
GLUCOSE: 135 mg/dL — AB (ref 70–99)
Potassium: 3.7 mmol/L (ref 3.5–5.1)
SODIUM: 134 mmol/L — AB (ref 135–145)

## 2017-08-26 LAB — PROTIME-INR
INR: 2.21
PROTHROMBIN TIME: 24.3 s — AB (ref 11.4–15.2)

## 2017-08-26 LAB — COMPREHENSIVE METABOLIC PANEL
ALBUMIN: 2.9 g/dL — AB (ref 3.5–5.0)
ALT: 49 U/L — ABNORMAL HIGH (ref 0–44)
ANION GAP: 7 (ref 5–15)
AST: 47 U/L — AB (ref 15–41)
Alkaline Phosphatase: 100 U/L (ref 38–126)
BILIRUBIN TOTAL: 0.4 mg/dL (ref 0.3–1.2)
BUN: 19 mg/dL (ref 8–23)
CO2: 27 mmol/L (ref 22–32)
Calcium: 8.1 mg/dL — ABNORMAL LOW (ref 8.9–10.3)
Chloride: 100 mmol/L (ref 98–111)
Creatinine, Ser: 0.74 mg/dL (ref 0.44–1.00)
GFR calc Af Amer: >60 mL/min (ref >60)
GFR calc non Af Amer: >60 mL/min (ref >60)
GLUCOSE: 137 mg/dL — AB (ref 70–99)
POTASSIUM: 3.8 mmol/L (ref 3.5–5.1)
SODIUM: 134 mmol/L — AB (ref 135–145)
TOTAL PROTEIN: 6.7 g/dL (ref 6.5–8.1)

## 2017-08-26 LAB — URINALYSIS, COMPLETE (UACMP) WITH MICROSCOPIC
BACTERIA UA: NONE SEEN
Bilirubin Urine: NEGATIVE
Glucose, UA: NEGATIVE mg/dL
KETONES UR: NEGATIVE mg/dL
LEUKOCYTES UA: NEGATIVE
Nitrite: NEGATIVE
PROTEIN: NEGATIVE mg/dL
Specific Gravity, Urine: 1.006 (ref 1.005–1.030)
pH: 6 (ref 5.0–8.0)

## 2017-08-26 LAB — MRSA PCR SCREENING: MRSA BY PCR: NEGATIVE

## 2017-08-26 MED ORDER — SODIUM CHLORIDE 0.9 % IV SOLN: 1.0000 g | Freq: Once | INTRAVENOUS | Status: DC

## 2017-08-26 MED ORDER — ONDANSETRON HCL 4 MG/2ML IJ SOLN: 4.0000 mg | Freq: Four times a day (QID) | INTRAMUSCULAR | Status: DC | PRN

## 2017-08-26 MED ORDER — SODIUM CHLORIDE 0.9 % IV SOLN
1.0000 g | INTRAVENOUS | Status: DC
  Administered 2017-08-27: 1 g via INTRAVENOUS
  Filled 2017-08-26: qty 10

## 2017-08-26 MED ORDER — AMLODIPINE BESYLATE 10 MG PO TABS
10.0000 mg | ORAL_TABLET | Freq: Every day | ORAL | Status: DC
  Administered 2017-08-26 – 2017-08-27 (×2): 10 mg via ORAL
  Filled 2017-08-26 (×2): qty 1

## 2017-08-26 MED ORDER — DIPHENHYDRAMINE HCL 25 MG PO CAPS: 25.0000 mg | ORAL_CAPSULE | Freq: Every evening | ORAL | Status: DC | PRN

## 2017-08-26 MED ORDER — NADOLOL 20 MG PO TABS
20.0000 mg | ORAL_TABLET | Freq: Every day | ORAL | Status: DC
  Administered 2017-08-26 – 2017-08-27 (×2): 20 mg via ORAL
  Filled 2017-08-26 (×2): qty 1

## 2017-08-26 MED ORDER — ACETAMINOPHEN 650 MG RE SUPP
650.0000 mg | Freq: Four times a day (QID) | RECTAL | Status: DC | PRN
Start: 2017-08-26 — End: 2017-08-27

## 2017-08-26 MED ORDER — ACETAMINOPHEN 325 MG PO TABS
650.0000 mg | ORAL_TABLET | Freq: Four times a day (QID) | ORAL | Status: DC | PRN
  Administered 2017-08-26 (×2): 650 mg via ORAL
  Filled 2017-08-26 (×2): qty 2

## 2017-08-26 MED ORDER — SODIUM CHLORIDE 0.9 % IV SOLN
INTRAVENOUS | Status: DC
  Administered 2017-08-26: 06:00:00 via INTRAVENOUS

## 2017-08-26 MED ORDER — ONDANSETRON HCL 4 MG PO TABS: 4.0000 mg | ORAL_TABLET | Freq: Four times a day (QID) | ORAL | Status: DC | PRN

## 2017-08-26 MED ORDER — OXYCODONE HCL 5 MG PO TABS
5.0000 mg | ORAL_TABLET | ORAL | Status: DC | PRN
  Filled 2017-08-26: qty 1

## 2017-08-26 MED ORDER — WARFARIN SODIUM 2 MG PO TABS
2.0000 mg | ORAL_TABLET | Freq: Two times a day (BID) | ORAL | Status: DC
  Administered 2017-08-26 – 2017-08-27 (×3): 2 mg via ORAL
  Filled 2017-08-26 (×5): qty 1

## 2017-08-26 MED ORDER — SODIUM CHLORIDE 0.9 % IV SOLN
1.0000 g | Freq: Once | INTRAVENOUS | Status: AC
  Administered 2017-08-26: 1 g via INTRAVENOUS
  Filled 2017-08-26: qty 10

## 2017-08-26 MED ORDER — FLECAINIDE ACETATE 50 MG PO TABS
75.0000 mg | ORAL_TABLET | Freq: Two times a day (BID) | ORAL | Status: DC
  Administered 2017-08-26 – 2017-08-27 (×3): 75 mg via ORAL
  Filled 2017-08-26 (×4): qty 2

## 2017-08-26 NOTE — ED Notes (Signed)
Patient transported to 153 

## 2017-08-26 NOTE — Evaluation (Signed)
Occupational Therapy Evaluation Patient Details Name: Alejandra Wiley MRN: [id number] DOB: [birth year] Today's Date: 08/26/2017    History of Present Illness 84 y/o female here with about 1 week s/p fall, with weakness and UTI.   Clinical Impression   Pt seen for OT evaluation this date. Pt was independent in all ADLs and mobility, living in [place] at [facility]. Her spouse is also at [facility] but in [place] and pt regularly visits him. 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 Pt reports becoming easily fatigued recently and having had a fall that led to her admission here 2/2 to a UTI. Pt reports feeling much better but does endorse feeling a little weak as compared to baseline. Pt able to perform bed mobility and toilet transfers, ambulating to/from bathroom to EOB with supervision and no LOB. Handheld assist provided as pt needed, since pt reports using a SPC on occasion, particularly recently as she felt unsteady. Pt and daughter educated in energy conservation conservation strategies including home/routines modifications, work simplification, AE/DME, prioritizing of meaningful occupations, and falls prevention to support return to daily routines while minimizing falls risk or overexertion. Pt/dtr also educated adaptive utensils to minimize impact of hand tremors that pt gets occasionally without warning that makes it difficult to use utensils and hold cups as well as impacts her handwriting. Pt/dtr verbalized understanding of all education/training provided. Pt not currently interested in OT after this hospitalization. OT encouraged pt to reach out to PCP in the future should she want to address the tremors and how it impacts her participation in ADL/IADL once she has recovered from this hospitalization. Pt and dtr very appreciative of education/training provided. No other additional acute OT needs at this time. Will sign off. Please re-consult if additional needs  arise.    Follow Up Recommendations  No OT follow up(encouraged pt to seek OT referral in the future from PCP should she be interested in f/u for hand tremors)    Equipment Recommendations  None recommended by OT    Recommendations for Other Services       Precautions / Restrictions Precautions Precautions: Fall Restrictions Weight Bearing Restrictions: No      Mobility Bed Mobility Overal bed mobility: Independent             General bed mobility comments: Pt easily gets to EOB w/o assist  Transfers Overall transfer level: Modified independent Equipment used: 1 person hand held assist             General transfer comment: Pt was able to rise to standing with good confidence, no phyiscal assist.      Balance Overall balance assessment: Needs assistance Sitting-balance support: Single extremity supported Sitting balance-Leahy Scale: Normal       Standing balance-Leahy Scale: Good Standing balance comment: Static standing she did well, she did have some stagger stepping and unsteadiness during guarded ambulation w/o AD.                           ADL either performed or assessed with clinical judgement   ADL Overall ADL's : Modified independent                                       General ADL Comments: Pt able to perform bed mobility, toilet transfers, dressing, and standing  grooming tasks with close supervision. No LOB. No significant difficulty. Pt noting fatigue afterwards but otherwise fairly independent.     Vision Patient Visual Report: No change from baseline       Perception     Praxis      Pertinent Vitals/Pain Pain Assessment: No/denies pain     Hand Dominance Right   Extremity/Trunk Assessment Upper Extremity Assessment Upper Extremity Assessment: Overall WFL for tasks assessed(Pt/dtr endorse occasional hand tremors, R>L, that impact her particularly during meals and handwriting tasks. No clear impetus for  when it happens.)   Lower Extremity Assessment Lower Extremity Assessment: Overall WFL for tasks assessed   Cervical / Trunk Assessment Cervical / Trunk Assessment: Normal   Communication Communication Communication: No difficulties   Cognition Arousal/Alertness: Awake/alert Behavior During Therapy: WFL for tasks assessed/performed Overall Cognitive Status: Within Functional Limits for tasks assessed                                     General Comments       Exercises Other Exercises Other Exercises: Pt/dtr educated in basic energy conservation principles to support pt's gradual transition back into daily routines given decreased activity tolerance from hospitalization and also to minimize fall risk. Pt/dtr verbalized understanding.  Other Exercises: Pt/dtr educated in adaptive utensils and equipment to minimize impact of hand tremors that come and go but impact her ability to self feed and write at times. Pt/dtr verbalized understanding.   Shoulder Instructions      Home Living Family/patient expects to be discharged to:: Private residence([facility]) Living Arrangements: Alone Available Help at Discharge: Family;Available PRN/intermittently Type of Home[place]       Home Layout: One level     Bathroom Shower/Tub: Occupational psychologist: Handicapped height         Additional Comments: husband at long term care, pt in [place] at [facility]      Prior Functioning/Environment Level of Independence: Independent        Comments: Pt has cane, rarely needs but has needed for the last week secondary to weakness.  Pt is able to drive, run errands, etc. Loves to play the piano and regularly holds concerts at [facility]        OT Problem List:        OT Treatment/Interventions:      OT Goals(Current goals can be found in the care plan section) Acute Rehab OT Goals Patient Stated Goal: get feeling  stronger OT Goal Formulation: All assessment and education complete, DC therapy ADL Goals Additional ADL Goal #1: Pt will verbalize plan to implement at least 1 learned energy conservation strategy to maximize safety and return to daily routines independently. Additional ADL Goal #2: Pt will trial use of adaptive utensils or other AE to maximize independence and satisfaction with self feeding while minimizing impact of tremor.  OT Frequency:     Barriers to D/C:            Co-evaluation              AM-PAC PT "6 Clicks" Daily Activity     Outcome Measure Help from another person eating meals?: None Help from another person taking care of personal grooming?: None Help from another person toileting, which includes using toliet, bedpan, or urinal?: None Help from another person bathing (including washing, rinsing, drying)?: None Help  from another person to put on and taking off regular upper body clothing?: None Help from another person to put on and taking off regular lower body clothing?: None 6 Click Score: 24   End of Session    Activity Tolerance: Patient tolerated treatment well Patient left: in bed;with call bell/phone within reach;with bed alarm set;with family/visitor present  OT Visit Diagnosis: Other abnormalities of gait and mobility (R26.89)                Time: 0110-0349 OT Time Calculation (min): 25 min Charges:  OT General Charges $OT Visit: 1 Visit OT Evaluation $OT Eval Low Complexity: 1 Low OT Treatments $Self Care/Home Management : 8-22 mins  Jeni Salles, MPH, MS, OTR/L ascom [phone] 08/26/17, 4:07 PM

## 2017-08-26 NOTE — ED Notes (Signed)
Pt daughter with concern of well-being of pt due to UTI and feeling that "since I live in [place] I can't take care of my mom." Pt lives at [facility] and pt feels that they are not taking care of pt well enough. Pt daughter was also concerned of altered mental status and pt not "not being herself" while pt has had a UTI.

## 2017-08-26 NOTE — Evaluation (Signed)
Physical Therapy Evaluation Patient Details Name: Alejandra Wiley MRN: [id number] DOB: 05-08-1932 Today's Date: 08/26/2017   History of Present Illness  84 y/o female here with about 1 week of weakness, had started anti-biotics for UTI that did not   Clinical Impression  Pt is able to do all mobility and transfers easily and w/o assist.  She maintained static standing balance well and showed good overall safety awareness.  She reports that until the last week of feeling weak she rarely needed an AD and could walk ad lib.  She did very well with walker so we did some ambulation/gait training (apart from exam) w/o AD but she have a few small stagger steps and maintain guarded posture t/o the effort.  Instructed pt to use cane when she does home and PT does recommend HHPT at discharge.    Follow Up Recommendations Home health PT    Equipment Recommendations  None recommended by PT(pt does not have a walker but likely will not need one)    Recommendations for Other Services       Precautions / Restrictions Precautions Precautions: Fall Restrictions Weight Bearing Restrictions: Yes      Mobility  Bed Mobility Overal bed mobility: Independent             General bed mobility comments: Pt easily gets to EOB w/o assist  Transfers Overall transfer level: Independent Equipment used: Rolling walker (2 wheeled)             General transfer comment: Pt was able to rise to standing with good confidence, no phyiscal assist.    Ambulation/Gait Ambulation/Gait assistance: Modified independent (Device/Increase time) Gait Distance (Feet): 200 Feet Assistive device: Rolling walker (2 wheeled);None;1 person hand held assist       General Gait Details: Pt was able to circumambulate the nurses' station.  She easily managed with RW, did ~125 ft w/o UEs but was in guarded position and did have a few small stagger steps that she could self arrest.  Pt did well with handrail/HHA with  improved cadence, speed and confidence.   Stairs            Wheelchair Mobility    Modified Rankin (Stroke Patients Only)       Balance Overall balance assessment: Needs assistance   Sitting balance-Leahy Scale: Normal       Standing balance-Leahy Scale: Good Standing balance comment: Static standing she did well, she did have some stagger stepping and unsteadiness during guarded ambulation w/o AD.                             Pertinent Vitals/Pain Pain Assessment: No/denies pain    Home Living Family/patient expects to be discharged to:: Unsure Living Arrangements: Alone               Additional Comments: husband at long term care, pt in [place] at [facility]    Prior Function Level of Independence: Independent         Comments: Pt has cane, rarely needs but has needed for the last week secondary to weakness.  Pt is able to drive, run errands, etc     Hand Dominance        Extremity/Trunk Assessment   Upper Extremity Assessment Upper Extremity Assessment: Overall WFL for tasks assessed    Lower Extremity Assessment Lower Extremity Assessment: Overall WFL for tasks assessed       Communication  Cognition Arousal/Alertness: Awake/alert Behavior During Therapy: WFL for tasks assessed/performed Overall Cognitive Status: Within Functional Limits for tasks assessed                                        General Comments      Exercises     Assessment/Plan    PT Assessment Patient needs continued PT services  PT Problem List Decreased activity tolerance;Decreased balance;Decreased safety awareness;Decreased knowledge of use of DME;Decreased mobility       PT Treatment Interventions Therapeutic activities;Functional mobility training;Gait training;DME instruction;Therapeutic exercise;Balance training    PT Goals (Current goals can be found in the Care Plan section)  Acute Rehab PT Goals Patient Stated  Goal: get feeling stronger PT Goal Formulation: With patient Time For Goal Achievement: 09/09/17 Potential to Achieve Goals: Good    Frequency Min 2X/week   Barriers to discharge        Co-evaluation               AM-PAC PT "6 Clicks" Daily Activity  Outcome Measure Difficulty turning over in bed (including adjusting bedclothes, sheets and blankets)?: None Difficulty moving from lying on back to sitting on the side of the bed? : None Difficulty sitting down on and standing up from a chair with arms (e.g., wheelchair, bedside commode, etc,.)?: None Help needed moving to and from a bed to chair (including a wheelchair)?: None Help needed walking in hospital room?: None Help needed climbing 3-5 steps with a railing? : None 6 Click Score: 24    End of Session Equipment Utilized During Treatment: Gait belt Activity Tolerance: Patient tolerated treatment well Patient left: with chair alarm set;with call bell/phone within reach Nurse Communication: Mobility status PT Visit Diagnosis: Muscle weakness (generalized) (M62.81);Difficulty in walking, not elsewhere classified (R26.2)    Time: 7654-6503 PT Time Calculation (min) (ACUTE ONLY): 32 min   Charges:   PT Evaluation $PT Eval Low Complexity: 1 Low PT Treatments $Gait Training: 8-22 mins        Kreg Shropshire, DPT 08/26/2017, 1:16 PM

## 2017-08-26 NOTE — H&P (Signed)
[facility] [place] NAME: Alejandra Wiley    MR#:  [id number]  DATE OF BIRTH:  1932/08/14  DATE OF ADMISSION:  08/26/2017  PRIMARY CARE PHYSICIAN: Kirk Ruths, MD   REQUESTING/REFERRING PHYSICIAN: Owens Shark, MD  CHIEF COMPLAINT:   Chief Complaint  Patient presents with  . Fall    HISTORY OF PRESENT ILLNESS:  Alejandra Wiley  is a 84 y.o. female who presents with chief complaint as above. Patient was diagnosed with UTI in outpatient setting and started on antibiotics but has had progression of symptoms.  She fell tonight prompting visit to ED.  CT imaging for traumatic injury from fall is within normal limits.  Tonight she has CVA tenderness, clinically indicative of progression of UTI to pyelonephritis.  Given failure of outpatient antibiotics to treat her condition, hospitalists were called for admission.  PAST MEDICAL HISTORY:   Past Medical History:  Diagnosis Date  . Bradycardia   . 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 CHF (congestive heart failure) ([place])   . Hypertension   . Pacemaker 12/06/2003   Medtronic Impulse DTDR01  . Pacemaker 2015  . PAF (paroxysmal atrial fibrillation) (HCC)    a. on Coumadin and flecainide; b. CHADS2VASc 3 (HTN, age x 2, female)  . Sick sinus syndrome ([place])   . Sinoatrial node dysfunction (HCC)   . Vaginal intraepithelial neoplasia      PAST SURGICAL HISTORY:   Past Surgical History:  Procedure Laterality Date  . APPENDECTOMY    . HERNIA REPAIR     lower abd  . IMAGE GUIDED SINUS SURGERY  2015  . INSERT / REPLACE / REMOVE PACEMAKER  12/06/2003   Medtronic impulse DTDR01  . JOINT REPLACEMENT Right    knee  . KYPHOPLASTY N/A 06/23/2017   Procedure: [id number];  Surgeon: Hessie Knows, MD;  Location: [facility] ORS;  Service: Orthopedics;  Laterality: N/A;  . LEEP  1999  . ORIF HIP FRACTURE Right 02/2016  . PACEMAKER GENERATOR CHANGE N/A 09/18/2012   Procedure:  PACEMAKER GENERATOR CHANGE;  Surgeon: Deboraha Sprang, MD;  Location: [facility] CATH LAB;  Service: Cardiovascular;  Laterality: N/A;  . TONSILLECTOMY    . TOTAL KNEE ARTHROPLASTY    . TOTAL KNEE REVISION Right 09/23/2016   Procedure: TOTAL KNEE REVISION-POLYETHYLENE EXCHANGE;  Surgeon: Hessie Knows, MD;  Location: [facility] ORS;  Service: Orthopedics;  Laterality: Right;  Marland Kitchen VAGINAL HYSTERECTOMY       SOCIAL HISTORY:   Social History   Tobacco Use  . Smoking status: Never Smoker  . Smokeless tobacco: Never Used  Substance Use Topics  . Alcohol use: Yes    Alcohol/week: 1.0 standard drinks    Types: 1 Glasses of wine per week    Comment: occasionally     FAMILY HISTORY:   Family History  Problem Relation Age of Onset  . Stroke Mother   . Heart disease Father   . Diabetes Daughter   . Diabetes Son      DRUG ALLERGIES:   Allergies  Allergen Reactions  . Latex Other (See Comments)    blisters  . Other Other (See Comments)    Blisters/ paper tape is Market researcher paper tape is Market researcher paper tape is Ok   . Tape Other (See Comments)    Blisters/ paper tape is Ok  . Xarelto [Rivaroxaban] Other (See Comments)    Gums bleeding and too much bruising  . Nickel Rash and Other (See  Comments)    MEDICATIONS AT HOME:   Prior to Admission medications   Medication Sig Start Date End Date Taking? Authorizing Provider  acetaminophen (TYLENOL) 500 MG tablet Take 500 mg by mouth every 8 (eight) hours as needed for mild pain or headache.    [provider]  amLODipine (NORVASC) 5 MG tablet Take 10 mg by mouth daily.     [provider]  diphenhydrAMINE (BENADRYL) 25 mg capsule Take 25 mg by mouth at bedtime as needed for sleep. Takes along with Tylenol     [provider]  flecainide (TAMBOCOR) 50 MG tablet TAKE 1 AND 1/2 TABLETS TWICE DAILY 03/08/17   Deboraha Sprang, MD  fluticasone [facility]) 50 MCG/ACT nasal spray Place 2 sprays into both nostrils daily.     [provider]  Glucosamine HCl 1500 MG TABS Take 1,500 mg by mouth daily.    [provider]  ibandronate (BONIVA) 150 MG tablet Take 150 mg by mouth every 30 (thirty) days. Take in the morning with a full glass of water, on an empty stomach, and do not take anything else by mouth or lie down for the next 30 min.    [provider]  Multiple Vitamin (MULTI VITAMIN DAILY PO) Take 1 tablet by mouth daily. Centrum silver    [provider]  nadolol (CORGARD) 20 MG tablet Take 20 mg by mouth daily.     [provider]  OVER THE COUNTER MEDICATION Place 1 spray into the nose daily. NeilMed Sinus Rinse    [provider]  warfarin (COUMADIN) 1 MG tablet Take 2 mg by mouth 2 (two) times daily.     [provider]    REVIEW OF SYSTEMS:  Review of Systems  Constitutional: Negative for chills, fever, malaise/fatigue and weight loss.  HENT: Negative for ear pain, hearing loss and tinnitus.   Eyes: Negative for blurred vision, double vision, pain and redness.  Respiratory: Negative for cough, hemoptysis and shortness of breath.   Cardiovascular: Negative for chest pain, palpitations, orthopnea and leg swelling.  Gastrointestinal: Negative for abdominal pain, constipation, diarrhea, nausea and vomiting.  Genitourinary: Positive for dysuria. Negative for frequency and hematuria.  Musculoskeletal: Positive for falls. Negative for back pain, joint pain and neck pain.  Skin:       No acne, rash, or lesions  Neurological: Negative for dizziness, tremors, focal weakness and weakness.  Endo/Heme/Allergies: Negative for polydipsia. Does not bruise/bleed easily.  Psychiatric/Behavioral: Negative for depression. The patient is not nervous/anxious and does not have insomnia.      VITAL SIGNS:   Vitals:   08/26/17 0213  BP: 122/62  Pulse: 100  Resp: 18  Temp: 97.7 F (36.5 C)  SpO2: 97%  Weight: 56.9 kg  Height: 5\' 6"  (1.676 m)   Wt  Readings from Last 3 Encounters:  08/26/17 56.9 kg  08/12/17 56.9 kg  07/18/17 56.8 kg    PHYSICAL EXAMINATION:  Physical Exam  Vitals reviewed. Constitutional: She is oriented to person, place, and time. She appears well-developed and well-nourished. No distress.  HENT:  Head: Normocephalic and atraumatic.  Mouth/Throat: Oropharynx is clear and moist.  Eyes: Pupils are equal, round, and reactive to light. Conjunctivae and EOM are normal. No scleral icterus.  Neck: Normal range of motion. Neck supple. No JVD present. No thyromegaly present.  Cardiovascular: Normal rate, regular rhythm and intact distal pulses. Exam reveals no gallop and no friction rub.  No murmur heard. Respiratory: Effort normal and  breath sounds normal. No respiratory distress. She has no wheezes. She has no rales.  GI: Soft. Bowel sounds are normal. She exhibits no distension. There is no tenderness.  Musculoskeletal: Normal range of motion. She exhibits no edema.  No arthritis, no gout  Lymphadenopathy:    She has no cervical adenopathy.  Neurological: She is alert and oriented to person, place, and time. No cranial nerve deficit.  No dysarthria, no aphasia  Skin: Skin is warm and dry. No rash noted. No erythema.  Psychiatric: She has a normal mood and affect. Her behavior is normal. Judgment and thought content normal.    LABORATORY PANEL:   CBC Recent Labs  Lab 08/26/17 0402  WBC 10.3  HGB 10.3*  HCT 29.6*  PLT 235   ------------------------------------------------------------------------------------------------------------------  Chemistries  No results for input(s): NA, K, CL, CO2, GLUCOSE, BUN, CREATININE, CALCIUM, MG, AST, ALT, ALKPHOS, BILITOT in the last 168 hours.  Invalid input(s): GFRCGP ------------------------------------------------------------------------------------------------------------------  Cardiac Enzymes No results for input(s): TROPONINI in the last 168  hours. ------------------------------------------------------------------------------------------------------------------  RADIOLOGY:  Ct Head Wo Contrast  Result Date: 08/26/2017 CLINICAL DATA:  Patient fell with occipital head injury. Patient is on anticoagulation. EXAM: CT HEAD WITHOUT CONTRAST CT CERVICAL SPINE WITHOUT CONTRAST TECHNIQUE: Multidetector CT imaging of the head and cervical spine was performed following the standard protocol without intravenous contrast. Multiplanar CT image reconstructions of the cervical spine were also generated. COMPARISON:  Head CT 08/24/2013 FINDINGS: CT HEAD FINDINGS Brain: No acute intracranial hemorrhage. Superficial and central atrophy with chronic small vessel ischemic disease. Midline fourth ventricle basal cisterns. No intra-axial mass nor extra-axial fluid collections. Brainstem and cerebellum are nonacute. Vascular: No hyperdense vessel sign. Skull: No skull fracture. Sinuses/Orbits: Mucosal opacification of both included maxillary sinuses. The ethmoid and sphenoid sinuses are clear. Intact orbits and globes. Other: Mastoids are clear. CT CERVICAL SPINE FINDINGS Alignment: Maintained cervical lordosis. Intact craniocervical relationship. Intact atlantodental interval. Skull base and vertebrae: No skull base fracture. No cervical spine fracture or listhesis. Soft tissues and spinal canal: No prevertebral soft tissue swelling. No visible canal hematoma. Disc levels: Marked disc space narrowing C2-3, C4-5, and C5-6. Uncovertebral joint osteoarthritis is identified more prominently at C3-4, on the right at C4-5 bilaterally C5-6. Mild uncinate spurring is identified. No significant foraminal encroachment. Multilevel degenerative facet arthrosis is present. Upper chest: Scarring at the apices. Other: None IMPRESSION: 1. Chronic stable atrophy with minimal small vessel ischemic disease. No acute intracranial abnormality. No hemorrhage. 2. Cervical spondylosis and facet  arthrosis. No acute cervical spine fracture or static listhesis. Electronically Signed   By: Ashley Royalty M.D.   On: 08/26/2017 02:56   Ct Cervical Spine Wo Contrast  Result Date: 08/26/2017 CLINICAL DATA:  Patient fell with occipital head injury. Patient is on anticoagulation. EXAM: CT HEAD WITHOUT CONTRAST CT CERVICAL SPINE WITHOUT CONTRAST TECHNIQUE: Multidetector CT imaging of the head and cervical spine was performed following the standard protocol without intravenous contrast. Multiplanar CT image reconstructions of the cervical spine were also generated. COMPARISON:  Head CT 08/24/2013 FINDINGS: CT HEAD FINDINGS Brain: No acute intracranial hemorrhage. Superficial and central atrophy with chronic small vessel ischemic disease. Midline fourth ventricle basal cisterns. No intra-axial mass nor extra-axial fluid collections. Brainstem and cerebellum are nonacute. Vascular: No hyperdense vessel sign. Skull: No skull fracture. Sinuses/Orbits: Mucosal opacification of both included maxillary sinuses. The ethmoid and sphenoid sinuses are clear. Intact orbits and globes. Other: Mastoids are clear. CT CERVICAL SPINE FINDINGS Alignment: Maintained cervical lordosis. Intact craniocervical  relationship. Intact atlantodental interval. Skull base and vertebrae: No skull base fracture. No cervical spine fracture or listhesis. Soft tissues and spinal canal: No prevertebral soft tissue swelling. No visible canal hematoma. Disc levels: Marked disc space narrowing C2-3, C4-5, and C5-6. Uncovertebral joint osteoarthritis is identified more prominently at C3-4, on the right at C4-5 bilaterally C5-6. Mild uncinate spurring is identified. No significant foraminal encroachment. Multilevel degenerative facet arthrosis is present. Upper chest: Scarring at the apices. Other: None IMPRESSION: 1. Chronic stable atrophy with minimal small vessel ischemic disease. No acute intracranial abnormality. No hemorrhage. 2. Cervical spondylosis  and facet arthrosis. No acute cervical spine fracture or static listhesis. Electronically Signed   By: Ashley Royalty M.D.   On: 08/26/2017 02:56    EKG:   Orders placed or performed in visit on 06/28/17  . EKG 12-Lead    IMPRESSION AND PLAN:  Principal Problem:   Pyelonephritis - IV antibiotics, urine culture Active Problems:   ATRIAL FIBRILLATION - continue home rate controlling meds and anticoagulation   Chronic diastolic heart failure (HCC) - continue home meds   HTN (hypertension) - continue home dose antihypertensives  Chart review performed and case discussed with ED provider. Labs, imaging and/or ECG reviewed by provider and discussed with patient/family. Management plans discussed with the patient and/or family.  DVT PROPHYLAXIS: Systemic anticoagulation  GI PROPHYLAXIS:  None  ADMISSION STATUS: Observation  CODE STATUS: Full Code Status History    Date Active Date Inactive Code Status Order ID Comments User Context   06/23/2017 1732 06/24/2017 1701 Full Code [id number]  Gladstone Lighter, MD Inpatient   09/23/2016 1825 09/24/2016 1702 Full Code [id number]  Hessie Knows, MD Inpatient    Advance Directive Documentation     Most Recent Value  Type of Advance Directive  Healthcare Power of [place], Living will  Pre-existing out of facility DNR order (yellow form or pink MOST form)  -  "MOST" Form in Place?  -      TOTAL TIME TAKING CARE OF THIS PATIENT: 40 minutes.   Alejandra Wiley 08/26/2017, 4:35 AM  [facility]  Office  [phone]  CC: Primary care physician; Kirk Ruths, MD  Note:  This document was prepared using Dragon voice recognition software and may include unintentional dictation errors.

## 2017-08-26 NOTE — Progress Notes (Signed)
ANTICOAGULATION CONSULT NOTE - Initial Consult  Pharmacy Consult for warfarin dosing Indication: atrial fibrillation  Allergies  Allergen Reactions  . Latex Other (See Comments)    blisters  . Other Other (See Comments)    Blisters/ paper tape is Market researcher paper tape is Market researcher paper tape is Ok   . Tape Other (See Comments)    Blisters/ paper tape is Ok  . Xarelto [Rivaroxaban] Other (See Comments)    Gums bleeding and too much bruising  . Nickel Rash and Other (See Comments)    Patient Measurements: Height: 5\' 6"  (167.6 cm) Weight: 125 lb 7.1 oz (56.9 kg) IBW/kg (Calculated) : 59.3 Heparin Dosing Weight:    Vital Signs: Temp: 98.4 F (36.9 C) (08/09 0536) BP: 125/70 (08/09 0536) Pulse Rate: 92 (08/09 0536)  Labs: Recent Labs    08/26/17 0402 08/26/17 0625  HGB 10.3* 9.9*  HCT 29.6* 28.9*  PLT 235 232  LABPROT  --  24.3*  INR  --  2.21  CREATININE 0.74 0.66    Estimated Creatinine Clearance: 48.7 mL/min (by C-G formula based on SCr of 0.66 mg/dL).   Medical History: Past Medical History:  Diagnosis Date  . Bradycardia   . 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 CHF (congestive heart failure) ([place])   . Hypertension   . Pacemaker 12/06/2003   Medtronic Impulse DTDR01  . Pacemaker 2015  . PAF (paroxysmal atrial fibrillation) (HCC)    a. on Coumadin and flecainide; b. CHADS2VASc 3 (HTN, age x 2, female)  . Sick sinus syndrome ([place])   . Sinoatrial node dysfunction (HCC)   . Vaginal intraepithelial neoplasia     Medications:  Home dose of warfarin 2 mg BID. Per med rec tech verified multiple times in presence of MD.  Assessment: INR therapeutic on admission  Goal of Therapy:  INR 2-3    Plan:  Continue home regimen as above. INR daily while on antibiotics.  Debralee Braaksma S 08/26/2017,6:58 AM

## 2017-08-26 NOTE — Care Management Note (Signed)
Case Management Note  Patient Details  Name: OPHELIA SIPE MRN: [id number] Date of Birth: February 20, 1932  Subjective/Objective:  Met with patient and her daughter regarding transition of care. Presented observation letter and both patient and daughter verbalized understanding , questions answered.   It is anticipated patient will discharge home to [place] tomorrow. Patient lives alone. Daughter lives in [place] and will be leaving Sunday. Prior to admission, patient was independent,  driving and going to yoga.  PT recommending home health PT. Offered a list of home care agencies  With referral made to [facility] for RN and PT.  Patient uses a cane as needed. No other DME needs. PCP is Dr. Ouida Sills.              **Requested hospitalist placed home health orders.    Action/Plan: [facility] for RN and PT  Expected Discharge Date:                  Expected Discharge Plan:  [place]  In-House Referral:     Discharge planning Services  CM Consult  Post Acute Care Choice:  Home Health Choice offered to:  Patient, Adult Children  DME Arranged:    DME Agency:     HH Arranged:  RN, PT [place] Agency:  [facility]  Status of Service:  In process, will continue to follow  If discussed at Long Length of Stay Meetings, dates discussed:    Additional Comments:  Jolly Mango, RN 08/26/2017, 3:21 PM

## 2017-08-26 NOTE — Progress Notes (Signed)
Talked with patient about moving her to a low bed d/t fall and being on a blood thinner. Patient stated that she's not confused and doesn't need a different bed.

## 2017-08-26 NOTE — Care Management Obs Status (Signed)
[place] NOTIFICATION   Patient Details  Name: Alejandra Wiley MRN: [id number] Date of Birth: 1932/06/30   Medicare Observation Status Notification Given:  Yes    Jolly Mango, RN 08/26/2017, 2:55 PM

## 2017-08-26 NOTE — ED Notes (Signed)
Pt states that she was going to bathroom and her "feet came out from under her" and hit the back of her head on the floor. Pt states that she did not have LOC or have any lightheadedness or dizzy when the fall occurred. Pt daughter states that pt has been on antibiotic for UTI.

## 2017-08-26 NOTE — Care Management Obs Status (Signed)
[place] NOTIFICATION   Patient Details  Name: ABRINA PETZ MRN: [id number] Date of Birth: 1932/03/23   Medicare Observation Status Notification Given:       Jolly Mango, RN 08/26/2017, 2:51 PM

## 2017-08-26 NOTE — ED Provider Notes (Signed)
[facility] Emergency Department Provider Note    First MD Initiated Contact with Patient 08/26/17 [phone]     (approximate)  I have reviewed the triage vital signs and the nursing notes.   HISTORY  Chief Complaint Fall    HPI Alejandra NAWROT is a 84 y.o. female with below list of chronic medical conditions including recent diagnosis of Rosanne Gutting tract infection presents to the emergency department following a fall.  Patient states that she lost her balance while in route to the bathroom resulted in fall with occipital head injury.  Patient denies any loss of consciousness.  Patient admits to increasing confusion and unsteady gait since Tuesday at which point she was seen by her primary care provider and diagnosed with a urinary tract infection.  Patient admits to persistent fever at home despite taking antibiotics as prescribed.  Patient also admits to back pain.   Past Medical History:  Diagnosis Date  . Bradycardia   . 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 CHF (congestive heart failure) ([place])   . Hypertension   . Pacemaker 12/06/2003   Medtronic Impulse DTDR01  . Pacemaker 2015  . PAF (paroxysmal atrial fibrillation) (HCC)    a. on Coumadin and flecainide; b. CHADS2VASc 3 (HTN, age x 2, female)  . Sick sinus syndrome ([place])   . Sinoatrial node dysfunction (HCC)   . Vaginal intraepithelial neoplasia     Patient Active Problem List   Diagnosis Date Noted  . Pyelonephritis 08/26/2017  . Chronic diastolic heart failure ([place]) 07/19/2017  . HTN (hypertension) 07/19/2017  . Painful total knee replacement ([place]) 09/23/2016  . 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 ATRIAL FIBRILLATION 06/13/2008  . PACEMAKER-Medtronic 06/13/2008    Past Surgical History:  Procedure Laterality Date  . APPENDECTOMY    . HERNIA REPAIR     lower abd  . IMAGE GUIDED SINUS SURGERY  2015  . INSERT / REPLACE / REMOVE PACEMAKER  12/06/2003   Medtronic impulse DTDR01  .  JOINT REPLACEMENT Right    knee  . KYPHOPLASTY N/A 06/23/2017   Procedure: [id number];  Surgeon: Hessie Knows, MD;  Location: [facility] ORS;  Service: Orthopedics;  Laterality: N/A;  . LEEP  1999  . ORIF HIP FRACTURE Right 02/2016  . PACEMAKER GENERATOR CHANGE N/A 09/18/2012   Procedure: PACEMAKER GENERATOR CHANGE;  Surgeon: Deboraha Sprang, MD;  Location: [facility] CATH LAB;  Service: Cardiovascular;  Laterality: N/A;  . TONSILLECTOMY    . TOTAL KNEE ARTHROPLASTY    . TOTAL KNEE REVISION Right 09/23/2016   Procedure: TOTAL KNEE REVISION-POLYETHYLENE EXCHANGE;  Surgeon: Hessie Knows, MD;  Location: [facility] ORS;  Service: Orthopedics;  Laterality: Right;  Marland Kitchen VAGINAL HYSTERECTOMY      Prior to Admission medications   Medication Sig Start Date End Date Taking? Authorizing Provider  acetaminophen (TYLENOL) 500 MG tablet Take 500 mg by mouth every 8 (eight) hours as needed for mild pain or headache.   Yes [provider]  amLODipine (NORVASC) 5 MG tablet Take 10 mg by mouth daily.    Yes [provider]  cefdinir (OMNICEF) 300 MG capsule Take 300 mg by mouth 2 (two) times daily. 08/23/17  Yes [provider]  diphenhydrAMINE (BENADRYL) 25 mg capsule Take 25 mg by mouth at bedtime as needed for sleep. Takes along with Tylenol    Yes [provider]  flecainide (TAMBOCOR) 50 MG tablet TAKE 1 AND 1/2 TABLETS TWICE DAILY Patient taking differently: Take 75 mg  by mouth 2 (two) times daily.  03/08/17  Yes Deboraha Sprang, MD  fluticasone [facility]) 50 MCG/ACT nasal spray Place 2 sprays into both nostrils daily.   Yes [provider]  Glucosamine HCl 1500 MG TABS Take 1,500 mg by mouth daily.   Yes [provider]  ibandronate (BONIVA) 150 MG tablet Take 150 mg by mouth every 30 (thirty) days. Take in the morning with a full glass of water, on an empty stomach, and do not take anything else by mouth or lie down for the next 30 min.   Yes [provider]    Multiple Vitamin (MULTI VITAMIN DAILY PO) Take 1 tablet by mouth daily. Centrum silver   Yes [provider]  nadolol (CORGARD) 20 MG tablet Take 10 mg by mouth daily.    Yes [provider]  OVER THE COUNTER MEDICATION Place 1 spray into the nose daily. NeilMed Sinus Rinse   Yes [provider]  promethazine (PHENERGAN) 25 MG tablet Take 25 mg by mouth every 6 (six) hours as needed for nausea. 08/23/17 08/30/17 Yes [provider]  warfarin (COUMADIN) 1 MG tablet Take 2 mg by mouth 2 (two) times daily.    Yes [provider]    Allergies Latex; Other; Tape; Xarelto [rivaroxaban]; and Nickel  Family History  Problem Relation Age of Onset  . Stroke Mother   . Heart disease Father   . Diabetes Daughter   . Diabetes Son     Social History Social History   Tobacco Use  . Smoking status: Never Smoker  . Smokeless tobacco: Never Used  Substance Use Topics  . Alcohol use: Yes    Alcohol/week: 1.0 standard drinks    Types: 1 Glasses of wine per week    Comment: occasionally  . Drug use: No    Review of Systems Constitutional: No fever/chills Eyes: No visual changes. ENT: No sore throat. Cardiovascular: Denies chest pain. Respiratory: Denies shortness of breath. Gastrointestinal: No abdominal pain.  No nausea, no vomiting.  No diarrhea.  No constipation. Genitourinary: Negative for dysuria. Musculoskeletal: Negative for neck pain.  Positive for back pain. Integumentary: Negative for rash. Neurological: Negative for headaches, focal weakness or numbness.   ____________________________________________   PHYSICAL EXAM:  VITAL SIGNS: ED Triage Vitals  Enc Vitals Group     BP 08/26/17 0213 122/62     Pulse Rate 08/26/17 0213 100     Resp 08/26/17 0213 18     Temp 08/26/17 0213 97.7 F (36.5 C)     Temp src --      SpO2 08/26/17 0213 97 %     Weight 08/26/17 0213 56.9 kg (125 lb 7.1 oz)     Height 08/26/17 0213 1.676 m (5\' 6" )      Head Circumference --      Peak Flow --      Pain Score 08/26/17 0214 4     Pain Loc --      Pain Edu? --      Excl. in [place]? --     Constitutional: Alert and oriented. Well appearing and in no acute distress. Eyes: Conjunctivae are normal.  Head: Atraumatic. Mouth/Throat: Mucous membranes are moist. Neck: No stridor. Cardiovascular: Normal rate, regular rhythm. Good peripheral circulation. Grossly normal heart sounds. Respiratory: Normal respiratory effort.  No retractions. Lungs CTAB. Gastrointestinal: Soft and nontender. No distention.  Positive for right CVA tenderness Musculoskeletal: No lower extremity tenderness nor edema. No gross deformities of extremities. Neurologic:  Normal speech and language. No gross focal neurologic deficits are appreciated.  Skin:  Skin is warm, dry and intact. No rash noted. Psychiatric: Mood and affect are normal. Speech and behavior are normal.  ____________________________________________   LABS (all labs ordered are listed, but only abnormal results are displayed)  Labs Reviewed  CBC - Abnormal; Notable for the following components:      Result Value   RBC 3.30 (*)    Hemoglobin 10.3 (*)    HCT 29.6 (*)    All other components within normal limits  COMPREHENSIVE METABOLIC PANEL - Abnormal; Notable for the following components:   Sodium 134 (*)    Glucose, Bld 137 (*)    Calcium 8.1 (*)    Albumin 2.9 (*)    AST 47 (*)    ALT 49 (*)    All other components within normal limits  URINALYSIS, COMPLETE (UACMP) WITH MICROSCOPIC - Abnormal; Notable for the following components:   Color, Urine STRAW (*)    APPearance CLEAR (*)    Hgb urine dipstick LARGE (*)    All other components within normal limits  PROTIME-INR - Abnormal; Notable for the following components:   Prothrombin Time 24.3 (*)    All other components within normal limits  BASIC METABOLIC PANEL - Abnormal; Notable for the following components:   Sodium 134 (*)     Glucose, Bld 135 (*)    Calcium 8.1 (*)    All other components within normal limits  CBC - Abnormal; Notable for the following components:   RBC 3.22 (*)    Hemoglobin 9.9 (*)    HCT 28.9 (*)    RDW 14.7 (*)    All other components within normal limits  URINE CULTURE     RADIOLOGY I, [place] Shenelle Klas, personally viewed and evaluated these images (plain radiographs) as part of my medical decision making, as well as reviewing the written report by the radiologist.  ED MD interpretation: No acute intracranial or cervical findings on CT head or cervical spine the radiologist  Official radiology report(s): Ct Head Wo Contrast  Result Date: 08/26/2017 CLINICAL DATA:  Patient fell with occipital head injury. Patient is on anticoagulation. EXAM: CT HEAD WITHOUT CONTRAST CT CERVICAL SPINE WITHOUT CONTRAST TECHNIQUE: Multidetector CT imaging of the head and cervical spine was performed following the standard protocol without intravenous contrast. Multiplanar CT image reconstructions of the cervical spine were also generated. COMPARISON:  Head CT 08/24/2013 FINDINGS: CT HEAD FINDINGS Brain: No acute intracranial hemorrhage. Superficial and central atrophy with chronic small vessel ischemic disease. Midline fourth ventricle basal cisterns. No intra-axial mass nor extra-axial fluid collections. Brainstem and cerebellum are nonacute. Vascular: No hyperdense vessel sign. Skull: No skull fracture. Sinuses/Orbits: Mucosal opacification of both included maxillary sinuses. The ethmoid and sphenoid sinuses are clear. Intact orbits and globes. Other: Mastoids are clear. CT CERVICAL SPINE FINDINGS Alignment: Maintained cervical lordosis. Intact craniocervical relationship. Intact atlantodental interval. Skull base and vertebrae: No skull base fracture. No cervical spine fracture or listhesis. Soft tissues and spinal canal: No prevertebral soft tissue swelling. No visible canal hematoma. Disc levels: Marked disc space  narrowing C2-3, C4-5, and C5-6. Uncovertebral joint osteoarthritis is identified more prominently at C3-4, on the right at C4-5 bilaterally C5-6. Mild uncinate spurring is identified. No significant foraminal encroachment. Multilevel degenerative facet arthrosis is present. Upper chest: Scarring at the apices. Other: None IMPRESSION: 1. Chronic stable atrophy with minimal small vessel ischemic disease. No acute intracranial abnormality. No hemorrhage. 2.  Cervical spondylosis and facet arthrosis. No acute cervical spine fracture or static listhesis. Electronically Signed   By: Ashley Royalty M.D.   On: 08/26/2017 02:56   Ct Cervical Spine Wo Contrast  Result Date: 08/26/2017 CLINICAL DATA:  Patient fell with occipital head injury. Patient is on anticoagulation. EXAM: CT HEAD WITHOUT CONTRAST CT CERVICAL SPINE WITHOUT CONTRAST TECHNIQUE: Multidetector CT imaging of the head and cervical spine was performed following the standard protocol without intravenous contrast. Multiplanar CT image reconstructions of the cervical spine were also generated. COMPARISON:  Head CT 08/24/2013 FINDINGS: CT HEAD FINDINGS Brain: No acute intracranial hemorrhage. Superficial and central atrophy with chronic small vessel ischemic disease. Midline fourth ventricle basal cisterns. No intra-axial mass nor extra-axial fluid collections. Brainstem and cerebellum are nonacute. Vascular: No hyperdense vessel sign. Skull: No skull fracture. Sinuses/Orbits: Mucosal opacification of both included maxillary sinuses. The ethmoid and sphenoid sinuses are clear. Intact orbits and globes. Other: Mastoids are clear. CT CERVICAL SPINE FINDINGS Alignment: Maintained cervical lordosis. Intact craniocervical relationship. Intact atlantodental interval. Skull base and vertebrae: No skull base fracture. No cervical spine fracture or listhesis. Soft tissues and spinal canal: No prevertebral soft tissue swelling. No visible canal hematoma. Disc levels: Marked  disc space narrowing C2-3, C4-5, and C5-6. Uncovertebral joint osteoarthritis is identified more prominently at C3-4, on the right at C4-5 bilaterally C5-6. Mild uncinate spurring is identified. No significant foraminal encroachment. Multilevel degenerative facet arthrosis is present. Upper chest: Scarring at the apices. Other: None IMPRESSION: 1. Chronic stable atrophy with minimal small vessel ischemic disease. No acute intracranial abnormality. No hemorrhage. 2. Cervical spondylosis and facet arthrosis. No acute cervical spine fracture or static listhesis. Electronically Signed   By: Ashley Royalty M.D.   On: 08/26/2017 02:56      Procedures   ____________________________________________   INITIAL IMPRESSION / ASSESSMENT AND PLAN / ED COURSE  As part of my medical decision making, I reviewed the following data within the electronic MEDICAL RECORD NUMBER   84 year old female presented with above-stated history and physical exam consistent with acute pyelonephritis following accidental fall..  CT scan of the head performed secondary to occipital head trauma however CT revealed no acute intracranial findings.  Given patient's increasing confusion and unsteady gait in the setting of pyelonephritis with persistent fevers in a patient who lives alone.  Patient discussed with Dr. Jannifer Franklin for hospital admission for further evaluation and management.  Patient given IV ceftriaxone in the emergency department ____________________________________________  FINAL CLINICAL IMPRESSION(S) / ED DIAGNOSES  Final diagnoses:  Acute pyelonephritis     MEDICATIONS GIVEN DURING THIS VISIT:  Medications  amLODipine (NORVASC) tablet 10 mg (has no administration in time range)  flecainide (TAMBOCOR) tablet 75 mg (has no administration in time range)  nadolol (CORGARD) tablet 20 mg (has no administration in time range)  diphenhydrAMINE (BENADRYL) capsule 25 mg (has no administration in time range)  0.9 %  sodium  chloride infusion ( Intravenous New Bag/Given 08/26/17 0538)  acetaminophen (TYLENOL) tablet 650 mg (has no administration in time range)    Or  acetaminophen (TYLENOL) suppository 650 mg (has no administration in time range)  oxyCODONE (Oxy IR/ROXICODONE) immediate release tablet 5 mg (has no administration in time range)  ondansetron (ZOFRAN) tablet 4 mg (has no administration in time range)    Or  ondansetron (ZOFRAN) injection 4 mg (has no administration in time range)  cefTRIAXone (ROCEPHIN) 1 g in sodium chloride 0.9 % 100 mL IVPB (has no administration in time range)  warfarin (COUMADIN) tablet 2 mg (has no administration in time range)  cefTRIAXone (ROCEPHIN) 1 g in sodium chloride 0.9 % 100 mL IVPB (0 g Intravenous Stopped 08/26/17 0435)     ED Discharge Orders    None       Note:  This document was prepared using Dragon voice recognition software and may include unintentional dictation errors.    Gregor Hams, MD 08/26/17 [phone]

## 2017-08-26 NOTE — NC FL2 (Signed)
[place] LEVEL OF CARE SCREENING TOOL     IDENTIFICATION  Patient Name: Alejandra Wiley Birthdate: July 06, 1932 Sex: female Admission Date (Current Location): 08/26/2017  [place] and [place] Number:  Engineering geologist and Address:  [facility], [address] [place], [place] [address]      Provider Number: [id number]  Attending Physician Name and Address:  Sela Hua, MD  Relative Name and Phone Number:       Current Level of Care: Hospital Recommended Level of Care: [place] Prior Approval Number:    Date Approved/Denied:   PASRR Number: [id number])  Discharge Plan: SNF    Current Diagnoses: Patient Active Problem List   Diagnosis Date Noted  . Pyelonephritis 08/26/2017  . Chronic diastolic heart failure ([place]) 07/19/2017  . HTN (hypertension) 07/19/2017  . Painful total knee replacement ([place]) 09/23/2016  . 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 ATRIAL FIBRILLATION 06/13/2008  . PACEMAKER-Medtronic 06/13/2008    Orientation RESPIRATION BLADDER Height & Weight     Self, Time, Situation, Place  Normal Continent Weight: 56.9 kg Height:  5\' 6"  (167.6 cm)  BEHAVIORAL SYMPTOMS/MOOD NEUROLOGICAL BOWEL NUTRITION STATUS      Continent Diet(Diet: Heart Healthy )  AMBULATORY STATUS COMMUNICATION OF NEEDS Skin   Extensive Assist Verbally Normal                       Personal Care Assistance Level of Assistance  Bathing, Feeding, Dressing Bathing Assistance: Limited assistance Feeding assistance: Independent Dressing Assistance: Limited assistance     Functional Limitations Info  Sight, Hearing, Speech Sight Info: Adequate Hearing Info: Adequate Speech Info: Adequate    SPECIAL CARE FACTORS FREQUENCY  PT (By licensed PT), OT (By licensed OT)     PT Frequency: (5) OT Frequency: (5)            Contractures      Additional Factors Info  Code Status, Allergies, Isolation Precautions Code  Status Info: (Full Code. ) Allergies Info: (Latex, Other, Tape, Xarelto Rivaroxaban, Nickel)     Isolation Precautions Info: (MRSA Nasal Swab. )     Current Medications (08/26/2017):  This is the current hospital active medication list Current Facility-Administered Medications  Medication Dose Route Frequency Provider Last Rate Last Dose  . 0.9 %  sodium chloride infusion   Intravenous Continuous Lance Coon, MD 50 mL/hr at 08/26/17 [phone]    . acetaminophen (TYLENOL) tablet 650 mg  650 mg Oral Q6H PRN Lance Coon, MD   650 mg at 08/26/17 0850   Or  . acetaminophen (TYLENOL) suppository 650 mg  650 mg Rectal Q6H PRN Lance Coon, MD      . amLODipine (NORVASC) tablet 10 mg  10 mg Oral Daily Lance Coon, MD   10 mg at 08/26/17 0851  . [START ON 08/27/2017] cefTRIAXone (ROCEPHIN) 1 g in sodium chloride 0.9 % 100 mL IVPB  1 g Intravenous Q24H Lance Coon, MD      . diphenhydrAMINE (BENADRYL) capsule 25 mg  25 mg Oral QHS PRN Lance Coon, MD      . flecainide [facility]) tablet 75 mg  75 mg Oral Laurence Spates, MD   75 mg at 08/26/17 0851  . nadolol (CORGARD) tablet 20 mg  20 mg Oral Daily Lance Coon, MD   20 mg at 08/26/17 0851  . ondansetron (ZOFRAN) tablet 4 mg  4 mg Oral Q6H PRN Lance Coon, MD  Or  . ondansetron (ZOFRAN) injection 4 mg  4 mg Intravenous Q6H PRN Lance Coon, MD      . oxyCODONE (Oxy IR/ROXICODONE) immediate release tablet 5 mg  5 mg Oral Q4H PRN Lance Coon, MD      . warfarin (COUMADIN) tablet 2 mg  2 mg Oral BID Lance Coon, MD   2 mg at 08/26/17 5320     Discharge Medications: Please see discharge summary for a list of discharge medications.  Relevant Imaging Results:  Relevant Lab Results:   Additional Information (SSN: [id number])  [place] Selke, Veronia Beets, LCSW

## 2017-08-26 NOTE — ED Triage Notes (Signed)
Pt to triage via w/c with no distress noted, accomp by daughter; pt reports that she fell going to [place]; st hit back of head; denies LOC

## 2017-08-26 NOTE — Clinical Social Work Note (Signed)
Clinical Social Work Assessment  Patient Details  Name: Alejandra Wiley MRN: [id number] Date of Birth: 28-Feb-1932  Date of referral:  08/26/17               Reason for consult:  Care Management Concerns                Permission sought to share information with:  Case Manager Permission granted to share information::  Yes, Verbal Permission Granted  Name::      [place]::     Relationship::     Contact Information:     Housing/Transportation Living arrangements for the past 2 months:  Charity fundraiser of Information:  Patient, Adult Children Patient Interpreter Needed:  None Criminal Activity/Legal Involvement Pertinent to Current Situation/Hospitalization:  No - Comment as needed Significant Relationships:  Adult Children Lives with:  Self Do you feel safe going back to the place where you live?  Yes Need for family participation in patient care:  Yes (Comment)  Care giving concerns:  Patient is an independent living resident at [facility].    Social Worker assessment / plan:  Holiday representative (Bracey) reviewed chart and noted that patient is from [place] independent living at the [facility]. PT is recommending home health. CSW met with patient and her daughter Alejandra Wiley was at bedside. Patient was alert and oriented X4 and was sitting up in the bed. CSW introduced self and explained role of CSW department. Per patient she lives alone and wants to return home back to independent living. Patient reported that she wants home health PT and nursing. Patient's daughter is in agreement with plan. Plan is for patient to D/C home back to independent living. RN case manager aware of above. [facility] admissions coordinator at [facility] is aware of above. Please reconsult if future social work needs arise. CSW signing off.   Employment status:  Retired Nurse, adult PT Recommendations:  Home with [place] / Referral to  community resources:  Other (Comment Required)(Home Health )  Patient/Family's Response to care:  Patient is agreeable to D/C home.   Patient/Family's Understanding of and Emotional Response to Diagnosis, Current Treatment, and Prognosis:  Patient and her daughter were very pleasant and thanked CSW for assistance.   Emotional Assessment Appearance:  Appears stated age Attitude/Demeanor/Rapport:    Affect (typically observed):  Accepting, Adaptable Orientation:  Oriented to Self, Oriented to Place, Oriented to  Time, Oriented to Situation Alcohol / Substance use:  Not Applicable Psych involvement (Current and /or in the community):  No (Comment)  Discharge Needs  Concerns to be addressed:  Discharge Planning Concerns Readmission within the last 30 days:  No Current discharge risk:  None Barriers to Discharge:  No Barriers Identified   Pressley Barsky, Veronia Beets, LCSW 08/26/2017, 3:45 PM

## 2017-08-26 NOTE — Progress Notes (Signed)
[facility] [place] NAME: Alejandra Wiley    MR#:  [id number]  DATE OF BIRTH:  [birth year]  SUBJECTIVE:  Doing well this morning. Still having some right flank pain. Previously having fevers and chills, but these have resolved.  REVIEW OF SYSTEMS:  Review of Systems  Constitutional: Negative for chills and fever.  HENT: Negative for congestion and sore throat.   Eyes: Negative for blurred vision and double vision.  Respiratory: Negative for cough and shortness of breath.   Cardiovascular: Negative for chest pain and palpitations.  Gastrointestinal: Negative for abdominal pain, nausea and vomiting.  Genitourinary: Negative for dysuria and frequency.  Musculoskeletal: Positive for back pain and falls.  Neurological: Negative for dizziness and headaches.    DRUG ALLERGIES:   Allergies  Allergen Reactions  . Latex Other (See Comments)    blisters  . Other Other (See Comments)    Blisters/ paper tape is Market researcher paper tape is Market researcher paper tape is Ok   . Tape Other (See Comments)    Blisters/ paper tape is Ok  . Xarelto [Rivaroxaban] Other (See Comments)    Gums bleeding and too much bruising  . Nickel Rash and Other (See Comments)   VITALS:  Blood pressure 131/67, pulse 84, temperature 98.7 F (37.1 C), temperature source Oral, resp. rate 16, height 5\' 6"  (1.676 m), weight 56.9 kg, SpO2 95 %. PHYSICAL EXAMINATION:  Physical Exam  Constitutional: She is oriented to person, place, and time. She appears well-developed and well-nourished. No distress.  HENT: Normocephalic and atraumatic. +mild tenderness of the back of her head, no abrasions. Oropharynx is clear and moist. MMM. Eyes: Pupils are equal, round, and reactive to light. Conjunctivae and EOM are normal. No scleral icterus.  Neck: Normal range of motion. Neck supple. No JVD present. No thyromegaly present.  Cardiovascular: Normal rate, regular rhythm and intact  distal pulses. Exam reveals no gallop and no friction rub. no murmurs. Respiratory: Effort normal and breath sounds normal. No respiratory distress. She has no wheezes. She has no rales.  Back: +CVA tenderness on the right. GI: Soft. Bowel sounds are normal. She exhibits no distension. There is no tenderness.  Musculoskeletal: Normal range of motion. She exhibits no edema.  Neurological: She is alert and oriented to person, place, and time. No cranial nerve deficit. no focal weakness. Skin: Skin is warm and dry. No rash noted. No erythema.  Psychiatric: She has a normal mood and affect. Her behavior is normal. Judgment and thought content normal.  LABORATORY PANEL:  Female CBC Recent Labs  Lab 08/26/17 0625  WBC 10.1  HGB 9.9*  HCT 28.9*  PLT 232   ------------------------------------------------------------------------------------------------------------------ Chemistries  Recent Labs  Lab 08/26/17 0402 08/26/17 0625  NA 134* 134*  K 3.8 3.7  CL 100 101  CO2 27 27  GLUCOSE 137* 135*  BUN 19 15  CREATININE 0.74 0.66  CALCIUM 8.1* 8.1*  AST 47*  --   ALT 49*  --   ALKPHOS 100  --   BILITOT 0.4  --    RADIOLOGY:  Ct Head Wo Contrast  Result Date: 08/26/2017 CLINICAL DATA:  Patient fell with occipital head injury. Patient is on anticoagulation. EXAM: CT HEAD WITHOUT CONTRAST CT CERVICAL SPINE WITHOUT CONTRAST TECHNIQUE: Multidetector CT imaging of the head and cervical spine was performed following the standard protocol without intravenous contrast. Multiplanar CT image reconstructions of the cervical spine were also generated. COMPARISON:  Head CT  08/24/2013 FINDINGS: CT HEAD FINDINGS Brain: No acute intracranial hemorrhage. Superficial and central atrophy with chronic small vessel ischemic disease. Midline fourth ventricle basal cisterns. No intra-axial mass nor extra-axial fluid collections. Brainstem and cerebellum are nonacute. Vascular: No hyperdense vessel sign. Skull: No  skull fracture. Sinuses/Orbits: Mucosal opacification of both included maxillary sinuses. The ethmoid and sphenoid sinuses are clear. Intact orbits and globes. Other: Mastoids are clear. CT CERVICAL SPINE FINDINGS Alignment: Maintained cervical lordosis. Intact craniocervical relationship. Intact atlantodental interval. Skull base and vertebrae: No skull base fracture. No cervical spine fracture or listhesis. Soft tissues and spinal canal: No prevertebral soft tissue swelling. No visible canal hematoma. Disc levels: Marked disc space narrowing C2-3, C4-5, and C5-6. Uncovertebral joint osteoarthritis is identified more prominently at C3-4, on the right at C4-5 bilaterally C5-6. Mild uncinate spurring is identified. No significant foraminal encroachment. Multilevel degenerative facet arthrosis is present. Upper chest: Scarring at the apices. Other: None IMPRESSION: 1. Chronic stable atrophy with minimal small vessel ischemic disease. No acute intracranial abnormality. No hemorrhage. 2. Cervical spondylosis and facet arthrosis. No acute cervical spine fracture or static listhesis. Electronically Signed   By: Ashley Royalty M.D.   On: 08/26/2017 02:56   Ct Cervical Spine Wo Contrast  Result Date: 08/26/2017 CLINICAL DATA:  Patient fell with occipital head injury. Patient is on anticoagulation. EXAM: CT HEAD WITHOUT CONTRAST CT CERVICAL SPINE WITHOUT CONTRAST TECHNIQUE: Multidetector CT imaging of the head and cervical spine was performed following the standard protocol without intravenous contrast. Multiplanar CT image reconstructions of the cervical spine were also generated. COMPARISON:  Head CT 08/24/2013 FINDINGS: CT HEAD FINDINGS Brain: No acute intracranial hemorrhage. Superficial and central atrophy with chronic small vessel ischemic disease. Midline fourth ventricle basal cisterns. No intra-axial mass nor extra-axial fluid collections. Brainstem and cerebellum are nonacute. Vascular: No hyperdense vessel sign.  Skull: No skull fracture. Sinuses/Orbits: Mucosal opacification of both included maxillary sinuses. The ethmoid and sphenoid sinuses are clear. Intact orbits and globes. Other: Mastoids are clear. CT CERVICAL SPINE FINDINGS Alignment: Maintained cervical lordosis. Intact craniocervical relationship. Intact atlantodental interval. Skull base and vertebrae: No skull base fracture. No cervical spine fracture or listhesis. Soft tissues and spinal canal: No prevertebral soft tissue swelling. No visible canal hematoma. Disc levels: Marked disc space narrowing C2-3, C4-5, and C5-6. Uncovertebral joint osteoarthritis is identified more prominently at C3-4, on the right at C4-5 bilaterally C5-6. Mild uncinate spurring is identified. No significant foraminal encroachment. Multilevel degenerative facet arthrosis is present. Upper chest: Scarring at the apices. Other: None IMPRESSION: 1. Chronic stable atrophy with minimal small vessel ischemic disease. No acute intracranial abnormality. No hemorrhage. 2. Cervical spondylosis and facet arthrosis. No acute cervical spine fracture or static listhesis. Electronically Signed   By: Ashley Royalty M.D.   On: 08/26/2017 02:56   ASSESSMENT AND PLAN:   Acute pyelonephritis- failed outpatient treatment with cefdinir. Has been afebrile and hemodynamically stable. - continue ceftriaxone - urine cultures pending - stop IVFs, encourage po - oxycodone prn pain, zofran prn nausea  Fall- occurred at home. Having some tenderness of the back of her head, but no headaches.  - CT head negative - monitor - PT consult  Paroxysmal A-fib- in NSR - continue home flecainide and nadolol - coumadin per pharm  Chronic diastolic heart failure- no signs of volume overload - stop IVFs - continue home nadolol  Hypertension- normotensive - continue home norvasc and nadolol  All the records are reviewed and case discussed with Care Management/Social Worker.  Management plans discussed with  the patient, family and they are in agreement.  CODE STATUS: Full Code  TOTAL TIME TAKING CARE OF THIS PATIENT: 35 minutes.   More than 50% of the time was spent in counseling/coordination of care: YES  POSSIBLE D/C IN 1-2 DAYS, DEPENDING ON CLINICAL CONDITION.   Berna Spare Zayneb Baucum M.D on 08/26/2017 at 12:51 PM  Between 7am to 6pm - Pager - [phone]  After 6pm go to [email or web address] - Technical brewer [facility]  Office  [phone]  CC: Primary care physician; Kirk Ruths, MD  Note: This dictation was prepared with Dragon dictation along with smaller phrase technology. Any transcriptional errors that result from this process are unintentional.

## 2017-08-27 DIAGNOSIS — I5032 Chronic diastolic (congestive) heart failure: Secondary | ICD-10-CM | POA: Diagnosis not present

## 2017-08-27 DIAGNOSIS — N12 Tubulo-interstitial nephritis, not specified as acute or chronic: Secondary | ICD-10-CM | POA: Diagnosis not present

## 2017-08-27 DIAGNOSIS — I10 Essential (primary) hypertension: Secondary | ICD-10-CM | POA: Diagnosis not present

## 2017-08-27 DIAGNOSIS — I4891 Unspecified atrial fibrillation: Secondary | ICD-10-CM | POA: Diagnosis not present

## 2017-08-27 DIAGNOSIS — I509 Heart failure, unspecified: Secondary | ICD-10-CM | POA: Diagnosis not present

## 2017-08-27 LAB — URINE CULTURE: Culture: NO GROWTH

## 2017-08-27 LAB — PROTIME-INR
INR: 2.61
Prothrombin Time: 27.7 seconds — ABNORMAL HIGH (ref 11.4–15.2)

## 2017-08-27 MED ORDER — NITROFURANTOIN MACROCRYSTAL 100 MG PO CAPS: 100.0000 mg | ORAL_CAPSULE | Freq: Two times a day (BID) | ORAL | 0 refills | Status: AC

## 2017-08-27 NOTE — Progress Notes (Signed)
Advanced care plan.  Purpose of the Encounter: CODE STATUS  Parties in Attendance: Patient  Patient's Decision Capacity: Good  Subjective/Patient's story: Presented to the emergency room for fall   Objective/Medical story Has urinary tract infection and acute pyelonephritis Needs IV antibiotics as she failed outpatient therapy   Goals of care determination:  Advance care directives and goals of care discussed Patient wants everything done for now which includes cpr, intubation and ventilator if need arises   CODE STATUS: Full code   Time spent discussing advanced care planning: 16 minutes

## 2017-08-27 NOTE — Discharge Summary (Addendum)
[facility] at [place] NAME: Alejandra Wiley    MR#:  [id number]  DATE OF BIRTH:  [birth year]  DATE OF ADMISSION:  08/26/2017 ADMITTING PHYSICIAN: Lance Coon, MD  DATE OF DISCHARGE: 08/27/2017  PRIMARY CARE PHYSICIAN: Kirk Ruths, MD   ADMISSION DIAGNOSIS:  Fall Urinary tract infection Atrial fibrillation Chronic diastolic heart failure Hypertension DISCHARGE DIAGNOSIS:  Principal Problem:   Pyelonephritis Active Problems:   ATRIAL FIBRILLATION   Chronic diastolic heart failure (HCC)   HTN (hypertension)   SECONDARY DIAGNOSIS:   Past Medical History:  Diagnosis Date  . Bradycardia   . 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 CHF (congestive heart failure) ([place])   . Hypertension   . Pacemaker 12/06/2003   Medtronic Impulse [id number]  . Pacemaker 2015  . PAF (paroxysmal atrial fibrillation) (HCC)    a. on Coumadin and flecainide; b. CHADS2VASc 3 (HTN, age x 2, female)  . Sick sinus syndrome ([place])   . Sinoatrial node dysfunction (HCC)   . Vaginal intraepithelial neoplasia      ADMITTING HISTORY Alejandra Wiley  is a 84 y.o. female who presents with chief complaint as above. Patient was diagnosed with UTI in outpatient setting and started on antibiotics but has had progression of symptoms.  She fell tonight prompting visit to ED.  CT imaging for traumatic injury from fall is within normal limits.  Tonight she has CVA tenderness, clinically indicative of progression of UTI to pyelonephritis.  Given failure of outpatient antibiotics to treat her condition, hospitalists were called for admission.  HOSPITAL COURSE:  Patient was admitted to medical floor.  Patient was started on IV Rocephin antibiotic and she tolerated antibiotics well.  Patient was worked up with CT head and CT cervical spine which showed no acute abnormality .She received physical therapy and occupational therapy.  Patient continued her medications for  atrial fibrillation for rate control.  Her INR is therapeutic at the time of discharge 2.61 and she continued her Coumadin in the hospital.  No flank pain, no fever at the time of discharge.  Tolerating diet well.  Patient will be discharged to [facility] independent facility on oral antibiotic with home health services.  CONSULTS OBTAINED:    DRUG ALLERGIES:   Allergies  Allergen Reactions  . Latex Other (See Comments)    blisters  . Other Other (See Comments)    Blisters/ paper tape is Market researcher paper tape is Market researcher paper tape is Ok   . Tape Other (See Comments)    Blisters/ paper tape is Ok  . Xarelto [Rivaroxaban] Other (See Comments)    Gums bleeding and too much bruising  . Nickel Rash and Other (See Comments)    DISCHARGE MEDICATIONS:   Allergies as of 08/27/2017      Reactions   Latex Other (See Comments)   blisters   Other Other (See Comments)   Blisters/ paper tape is Market researcher paper tape is Market researcher paper tape is Ok   Tape Other (See Comments)   Blisters/ paper tape is Ok   Xarelto [rivaroxaban] Other (See Comments)   Gums bleeding and too much bruising   Nickel Rash, Other (See Comments)      Medication List    STOP taking these medications   cefdinir 300 MG capsule Commonly known as:  OMNICEF     TAKE these medications   acetaminophen 500 MG tablet Commonly known as:  TYLENOL Take 500 mg  by mouth every 8 (eight) hours as needed for mild pain or headache.   amLODipine 5 MG tablet Commonly known as:  NORVASC Take 10 mg by mouth daily.   diphenhydrAMINE 25 mg capsule Commonly known as:  BENADRYL Take 25 mg by mouth at bedtime as needed for sleep. Takes along with Tylenol   flecainide 50 MG tablet Commonly known as:  TAMBOCOR TAKE 1 AND 1/2 TABLETS TWICE DAILY What changed:  See the new instructions.   fluticasone 50 MCG/ACT nasal spray Commonly known as:  FLONASE Place 2 sprays into both nostrils daily.   Glucosamine HCl  1500 MG Tabs Take 1,500 mg by mouth daily.   ibandronate 150 MG tablet Commonly known as:  BONIVA Take 150 mg by mouth every 30 (thirty) days. Take in the morning with a full glass of water, on an empty stomach, and do not take anything else by mouth or lie down for the next 30 min.   MULTI VITAMIN DAILY PO Take 1 tablet by mouth daily. Centrum silver   nadolol 20 MG tablet Commonly known as:  CORGARD Take 10 mg by mouth daily.   nitrofurantoin 100 MG capsule Commonly known as:  MACRODANTIN Take 1 capsule (100 mg total) by mouth 2 (two) times daily for 5 days.   OVER THE COUNTER MEDICATION Place 1 spray into the nose daily. NeilMed Sinus Rinse   promethazine 25 MG tablet Commonly known as:  PHENERGAN Take 25 mg by mouth every 6 (six) hours as needed for nausea.   warfarin 1 MG tablet Commonly known as:  COUMADIN Take 2 mg by mouth 2 (two) times daily.       Today  Patient seen today No flank pain No fever Tolerating diet well  VITAL SIGNS:  Blood pressure 130/65, pulse 83, temperature 98.8 F (37.1 C), temperature source Oral, resp. rate 16, height 5\' 6"  (1.676 m), weight 56.9 kg, SpO2 97 %.  I/O:    Intake/Output Summary (Last 24 hours) at 08/27/2017 1053 Last data filed at 08/27/2017 1013 Gross per 24 hour  Intake 180 ml  Output -  Net 180 ml    PHYSICAL EXAMINATION:  Physical Exam  GENERAL:  84 y.o.-year-old patient lying in the bed with no acute distress.  LUNGS: Normal breath sounds bilaterally, no wheezing, rales,rhonchi or crepitation. No use of accessory muscles of respiration.  CARDIOVASCULAR: S1, S2 normal. No murmurs, rubs, or gallops.  ABDOMEN: Soft, non-tender, non-distended. Bowel sounds present. No organomegaly or mass.  NEUROLOGIC: Moves all 4 extremities. PSYCHIATRIC: The patient is alert and oriented x 3.  SKIN: No obvious rash, lesion, or ulcer.   DATA REVIEW:   CBC Recent Labs  Lab 08/26/17 0625  WBC 10.1  HGB 9.9*  HCT 28.9*   PLT 232    Chemistries  Recent Labs  Lab 08/26/17 0402 08/26/17 0625  NA 134* 134*  K 3.8 3.7  CL 100 101  CO2 27 27  GLUCOSE 137* 135*  BUN 19 15  CREATININE 0.74 0.66  CALCIUM 8.1* 8.1*  AST 47*  --   ALT 49*  --   ALKPHOS 100  --   BILITOT 0.4  --     Cardiac Enzymes No results for input(s): TROPONINI in the last 168 hours.  Microbiology Results  Results for orders placed or performed during the hospital encounter of 08/26/17  Urine culture     Status: None   Collection Time: 08/26/17  5:40 AM  Result Value Ref Range Status  Specimen Description   Final    URINE, RANDOM Performed at [facility], [address] [place], [place] [address]    Special Requests   Final    NONE Performed at [facility], [address] [place], [place] [address]    Culture   Final    NO GROWTH Performed at [facility], [place] [address] [place], [place] [address]    Report Status 08/27/2017 FINAL  Final  MRSA PCR Screening     Status: None   Collection Time: 08/26/17  1:21 PM  Result Value Ref Range Status   MRSA by PCR NEGATIVE NEGATIVE Final    Comment:        The GeneXpert MRSA Assay (FDA approved for NASAL specimens only), is one component of a comprehensive MRSA colonization surveillance program. It is not intended to diagnose MRSA infection nor to guide or monitor treatment for MRSA infections. Performed at [facility], [address] [place], [place] [address]     RADIOLOGY:  Ct Head Wo Contrast  Result Date: 08/26/2017 CLINICAL DATA:  Patient fell with occipital head injury. Patient is on anticoagulation. EXAM: CT HEAD WITHOUT CONTRAST CT CERVICAL SPINE WITHOUT CONTRAST TECHNIQUE: Multidetector CT imaging of the head and cervical spine was performed following the standard protocol without intravenous contrast. Multiplanar CT image reconstructions of the cervical spine were also generated. COMPARISON:  Head CT 08/24/2013  FINDINGS: CT HEAD FINDINGS Brain: No acute intracranial hemorrhage. Superficial and central atrophy with chronic small vessel ischemic disease. Midline fourth ventricle basal cisterns. No intra-axial mass nor extra-axial fluid collections. Brainstem and cerebellum are nonacute. Vascular: No hyperdense vessel sign. Skull: No skull fracture. Sinuses/Orbits: Mucosal opacification of both included maxillary sinuses. The ethmoid and sphenoid sinuses are clear. Intact orbits and globes. Other: Mastoids are clear. CT CERVICAL SPINE FINDINGS Alignment: Maintained cervical lordosis. Intact craniocervical relationship. Intact atlantodental interval. Skull base and vertebrae: No skull base fracture. No cervical spine fracture or listhesis. Soft tissues and spinal canal: No prevertebral soft tissue swelling. No visible canal hematoma. Disc levels: Marked disc space narrowing C2-3, C4-5, and C5-6. Uncovertebral joint osteoarthritis is identified more prominently at C3-4, on the right at C4-5 bilaterally C5-6. Mild uncinate spurring is identified. No significant foraminal encroachment. Multilevel degenerative facet arthrosis is present. Upper chest: Scarring at the apices. Other: None IMPRESSION: 1. Chronic stable atrophy with minimal small vessel ischemic disease. No acute intracranial abnormality. No hemorrhage. 2. Cervical spondylosis and facet arthrosis. No acute cervical spine fracture or static listhesis. Electronically Signed   By: Ashley Royalty M.D.   On: 08/26/2017 02:56   Ct Cervical Spine Wo Contrast  Result Date: 08/26/2017 CLINICAL DATA:  Patient fell with occipital head injury. Patient is on anticoagulation. EXAM: CT HEAD WITHOUT CONTRAST CT CERVICAL SPINE WITHOUT CONTRAST TECHNIQUE: Multidetector CT imaging of the head and cervical spine was performed following the standard protocol without intravenous contrast. Multiplanar CT image reconstructions of the cervical spine were also generated. COMPARISON:  Head CT  08/24/2013 FINDINGS: CT HEAD FINDINGS Brain: No acute intracranial hemorrhage. Superficial and central atrophy with chronic small vessel ischemic disease. Midline fourth ventricle basal cisterns. No intra-axial mass nor extra-axial fluid collections. Brainstem and cerebellum are nonacute. Vascular: No hyperdense vessel sign. Skull: No skull fracture. Sinuses/Orbits: Mucosal opacification of both included maxillary sinuses. The ethmoid and sphenoid sinuses are clear. Intact orbits and globes. Other: Mastoids are clear. CT CERVICAL SPINE FINDINGS Alignment: Maintained cervical lordosis. Intact craniocervical relationship. Intact atlantodental  interval. Skull base and vertebrae: No skull base fracture. No cervical spine fracture or listhesis. Soft tissues and spinal canal: No prevertebral soft tissue swelling. No visible canal hematoma. Disc levels: Marked disc space narrowing C2-3, C4-5, and C5-6. Uncovertebral joint osteoarthritis is identified more prominently at C3-4, on the right at C4-5 bilaterally C5-6. Mild uncinate spurring is identified. No significant foraminal encroachment. Multilevel degenerative facet arthrosis is present. Upper chest: Scarring at the apices. Other: None IMPRESSION: 1. Chronic stable atrophy with minimal small vessel ischemic disease. No acute intracranial abnormality. No hemorrhage. 2. Cervical spondylosis and facet arthrosis. No acute cervical spine fracture or static listhesis. Electronically Signed   By: Ashley Royalty M.D.   On: 08/26/2017 02:56    Follow up with PCP in 1 week.  Management plans discussed with the patient, family and they are in agreement.  CODE STATUS: Full code    Code Status Orders  (From admission, onward)         Start     Ordered   08/26/17 0522  Full code  Continuous     08/26/17 0521        Code Status History    Date Active Date Inactive Code Status Order ID Comments User Context   06/23/2017 1732 06/24/2017 1701 Full Code [id number]   Gladstone Lighter, MD Inpatient   09/23/2016 1825 09/24/2016 1702 Full Code [id number]  Hessie Knows, MD Inpatient    Advance Directive Documentation     Most Recent Value  Type of Advance Directive  Healthcare Power of [place], Living will  Pre-existing out of facility DNR order (yellow form or pink MOST form)  -  "MOST" Form in Place?  -      TOTAL TIME TAKING CARE OF THIS PATIENT ON DAY OF DISCHARGE: more than 33 minutes.   [name] M.D on 08/27/2017 at 10:53 AM  Between 7am to 6pm - Pager - [phone]  After 6pm go to [email or web address] - password [email or web address] [facility]  Office  [phone]  CC: Primary care physician; Kirk Ruths, MD  Note: This dictation was prepared with Dragon dictation along with smaller phrase technology. Any transcriptional errors that result from this process are unintentional.

## 2017-08-27 NOTE — Care Management Note (Signed)
Case Management Note  Patient Details  Name: Alejandra Wiley MRN: [id number] Date of Birth: 10-Feb-1932  Subjective/Objective:  Patient to be discharged per MD order. Orders in place for home health services. Previous RNCM began workup for home health with advanced home care. Referral confirmed with Brenton Grills from [facility] who agrees to accept the patient for PT and RN services. Walker already in home. Family to provide transport.                  Ines Bloomer RN BSN RNCM [phone]   Action/Plan:   Expected Discharge Date:  08/27/17               Expected Discharge Plan:  [place]  In-House Referral:     Discharge planning Services  CM Consult  Post Acute Care Choice:  Home Health Choice offered to:  Patient, Adult Children  DME Arranged:    DME Agency:     HH Arranged:  RN, PT [place] Agency:  [facility]  Status of Service:  Completed, signed off  If discussed at [place] of Stay Meetings, dates discussed:    Additional Comments:  Latanya Maudlin, RN 08/27/2017, 9:42 AM

## 2017-08-27 NOTE — Progress Notes (Signed)
Patient discharging home. Instructions and prescription given to patient, verbalized understandiing. IV removed. Waiting on friend to transport patient home.

## 2017-08-30 ENCOUNTER — Ambulatory Visit: Payer: Medicare HMO | Attending: Family | Admitting: Family

## 2017-08-30 ENCOUNTER — Encounter: Payer: Self-pay | Admitting: Family

## 2017-08-30 VITALS — BP 124/77 | HR 84 | Resp 18 | Ht 66.0 in | Wt 124.2 lb

## 2017-08-30 DIAGNOSIS — I110 Hypertensive heart disease with heart failure: Secondary | ICD-10-CM | POA: Diagnosis not present

## 2017-08-30 DIAGNOSIS — R5383 Other fatigue: Secondary | ICD-10-CM | POA: Insufficient documentation

## 2017-08-30 DIAGNOSIS — N12 Tubulo-interstitial nephritis, not specified as acute or chronic: Secondary | ICD-10-CM

## 2017-08-30 DIAGNOSIS — Z8542 Personal history of malignant neoplasm of other parts of uterus: Secondary | ICD-10-CM | POA: Insufficient documentation

## 2017-08-30 DIAGNOSIS — Z79899 Other long term (current) drug therapy: Secondary | ICD-10-CM | POA: Insufficient documentation

## 2017-08-30 DIAGNOSIS — R110 Nausea: Secondary | ICD-10-CM | POA: Insufficient documentation

## 2017-08-30 DIAGNOSIS — I480 Paroxysmal atrial fibrillation: Secondary | ICD-10-CM | POA: Diagnosis not present

## 2017-08-30 DIAGNOSIS — I10 Essential (primary) hypertension: Secondary | ICD-10-CM

## 2017-08-30 DIAGNOSIS — I5032 Chronic diastolic (congestive) heart failure: Secondary | ICD-10-CM

## 2017-08-30 DIAGNOSIS — Z950 Presence of cardiac pacemaker: Secondary | ICD-10-CM | POA: Diagnosis not present

## 2017-08-30 DIAGNOSIS — R42 Dizziness and giddiness: Secondary | ICD-10-CM | POA: Insufficient documentation

## 2017-08-30 DIAGNOSIS — Z7901 Long term (current) use of anticoagulants: Secondary | ICD-10-CM | POA: Insufficient documentation

## 2017-08-30 DIAGNOSIS — N10 Acute tubulo-interstitial nephritis: Secondary | ICD-10-CM | POA: Insufficient documentation

## 2017-08-30 DIAGNOSIS — R002 Palpitations: Secondary | ICD-10-CM | POA: Insufficient documentation

## 2017-08-30 DIAGNOSIS — I495 Sick sinus syndrome: Secondary | ICD-10-CM | POA: Diagnosis not present

## 2017-08-30 DIAGNOSIS — R0602 Shortness of breath: Secondary | ICD-10-CM | POA: Diagnosis not present

## 2017-08-30 LAB — CBC
HCT: 33.3 % — ABNORMAL LOW (ref 35.0–47.0)
Hemoglobin: 11.2 g/dL — ABNORMAL LOW (ref 12.0–16.0)
MCH: 30.7 pg (ref 26.0–34.0)
MCHC: 33.7 g/dL (ref 32.0–36.0)
MCV: 91.2 fL (ref 80.0–100.0)
Platelets: 280 10*3/uL (ref 150–440)
RBC: 3.65 MIL/uL — ABNORMAL LOW (ref 3.80–5.20)
RDW: 14.7 % — ABNORMAL HIGH (ref 11.5–14.5)
WBC: 7.3 10*3/uL (ref 3.6–11.0)

## 2017-08-30 NOTE — Progress Notes (Signed)
Patient ID: [name], female    DOB: January 17, 1933, 84 y.o.   MRN: [id number]  HPI  [name] is a 84 y/o female with a history of vaginal cancer, sick sinus syndrome, paroxysmal atrial fibrillation, HTN and chronic heart failure.   Echo report from 06/24/17 reviewed and showed an EF of 50-55% along with mild/moderate MR and a mildly elevated PA pressure of 44 mm Hg.   Admitted 08/26/17 due to acute pyelonephritis. Initially given IV antibiotics. Head CT was negative. Discharged the next day. Admitted 06/23/17 due to atrial tachycardia and pulmonary edema after kyphoplasty of T-9 on 06/23/17. Cardiology consult obtained. Discharged the following day.   She presents today for a follow-up visit with a chief complaint of moderate fatigue upon minimal exertion. She says this has been chronic over many months and doesn't seem to be getting any better. She has associated shortness of breath, light-headedness, palpitations, nausea and difficulty sleeping along with this. She denies any abdominal distention, pedal edema, chest pain or weight gain. Remains on antibiotics due to recent pyelonephritis and has to force herself to eat. Overall she feels quite drained after recent hospitalization. HR is running 80-90's at home since decreasing her nadolol in 1/2.   Past Medical History:  Diagnosis Date  . Bradycardia   . Cancer ([place])    vaginal intraepithelial neoplasia  . CHF (congestive heart failure) ([place])   . Hypertension   . Pacemaker 12/06/2003   Medtronic Impulse DTDR01  . Pacemaker 2015  . PAF (paroxysmal atrial fibrillation) ([facility])    a. on Coumadin and flecainide; b. CHADS2VASc 3 (HTN, age x 2, female)  . Sick sinus syndrome ([place])   . Sinoatrial node dysfunction (H[facility])   . Vaginal intraepithelial neoplasia    Past Surgical History:  Procedure Laterality Date  . APPENDECTOMY    . HERNIA REPAIR     lower abd  . IMAGE GUIDED SINUS SURGERY  2015  . INSERT / REPLACE / REMOVE PACEMAKER  12/06/2003    Medtronic impulse DTDR01  . JOINT REPLACEMENT Right    knee  . KYPHOPLASTY N/A 06/23/2017   Procedure: [id number];  Surgeon: Hessie Knows, MD;  Location: [facility] ORS;  Service: Orthopedics;  Laterality: N/A;  . LEEP  1999  . ORIF HIP FRACTURE Right 02/2016  . PACEMAKER GENERATOR CHANGE N/A 09/18/2012   Procedure: PACEMAKER GENERATOR CHANGE;  Surgeon: Deboraha Sprang, MD;  Location: [facility] CATH LAB;  Service: Cardiovascular;  Laterality: N/A;  . TONSILLECTOMY    . TOTAL KNEE ARTHROPLASTY    . TOTAL KNEE REVISION Right 09/23/2016   Procedure: TOTAL KNEE REVISION-POLYETHYLENE EXCHANGE;  Surgeon: Hessie Knows, MD;  Location: [facility] ORS;  Service: Orthopedics;  Laterality: Right;  Marland Kitchen VAGINAL HYSTERECTOMY     Family History  Problem Relation Age of Onset  . Stroke Mother   . Heart disease Father   . Diabetes Daughter   . Diabetes Son    Social History   Tobacco Use  . Smoking status: Never Smoker  . Smokeless tobacco: Never Used  Substance Use Topics  . Alcohol use: Yes    Alcohol/week: 1.0 standard drinks    Types: 1 Glasses of wine per week    Comment: occasionally   Allergies  Allergen Reactions  . Latex Other (See Comments)    blisters  . Other Other (See Comments)    Blisters/ paper tape is Market researcher paper tape is Market researcher paper tape is Ok   . Tape Other (See Comments)  Blisters/ paper tape is Ok  . Xarelto [Rivaroxaban] Other (See Comments)    Gums bleeding and too much bruising  . Nickel Rash and Other (See Comments)   Prior to Admission medications   Medication Sig Start Date End Date Taking? Authorizing Provider  acetaminophen (TYLENOL) 500 MG tablet Take 500 mg by mouth every 8 (eight) hours as needed for mild pain or headache.   Yes [provider]  amLODipine (NORVASC) 5 MG tablet Take 5 mg by mouth daily.    Yes [provider]  diphenhydrAMINE (BENADRYL) 25 mg capsule Take 25 mg by mouth at bedtime as needed for sleep. Takes along with  Tylenol    Yes [provider]  flecainide (TAMBOCOR) 50 MG tablet TAKE 1 AND 1/2 TABLETS TWICE DAILY Patient taking differently: Take 75 mg by mouth 2 (two) times daily.  03/08/17  Yes Deboraha Sprang, MD  fluticasone [facility]) 50 MCG/ACT nasal spray Place 2 sprays into both nostrils daily.   Yes [provider]  Glucosamine HCl 1500 MG TABS Take 1,500 mg by mouth daily.   Yes [provider]  ibandronate (BONIVA) 150 MG tablet Take 150 mg by mouth every 30 (thirty) days. Take in the morning with a full glass of water, on an empty stomach, and do not take anything else by mouth or lie down for the next 30 min.   Yes [provider]  Multiple Vitamin (MULTI VITAMIN DAILY PO) Take 1 tablet by mouth daily. Centrum silver   Yes [provider]  nadolol (CORGARD) 20 MG tablet Take 10 mg by mouth daily.    Yes [provider]  nitrofurantoin (MACRODANTIN) 100 MG capsule Take 1 capsule (100 mg total) by mouth 2 (two) times daily for 5 days. 08/27/17 09/01/17 Yes Pyreddy, Reatha Harps, MD  OVER THE COUNTER MEDICATION Place 1 spray into the nose daily. NeilMed Sinus Rinse   Yes [provider]  promethazine (PHENERGAN) 25 MG tablet Take 25 mg by mouth every 6 (six) hours as needed for nausea. 08/23/17 08/30/17 Yes [provider]  warfarin (COUMADIN) 1 MG tablet Take 2 mg by mouth 2 (two) times daily.    Yes [provider]    Review of Systems  Constitutional: Positive for fatigue (tire easily).  HENT: Positive for congestion (left sinus congestion). Negative for postnasal drip and sore throat.   Eyes: Negative.   Respiratory: Positive for shortness of breath (when walking long distances). Negative for chest tightness.   Cardiovascular: Positive for palpitations (at times). Negative for chest pain and leg swelling.  Gastrointestinal: Positive for nausea and vomiting. Negative for abdominal distention and abdominal pain.  Endocrine:  Negative.   Genitourinary: Negative.   Musculoskeletal: Positive for arthralgias (both legs hurt) and back pain.  Skin: Negative.   Allergic/Immunologic: Negative.   Neurological: Positive for light-headedness. Negative for dizziness.  Hematological: Negative for adenopathy. Does not bruise/bleed easily.  Psychiatric/Behavioral: Positive for sleep disturbance (sleeping 8-9 hours/ night along with 2 naps daily). Negative for dysphoric mood. The patient is nervous/anxious.    Vitals:   08/30/17 1323  BP: 124/77  Pulse: 84  Resp: 18  SpO2: 99%  Weight: 124 lb 4 oz (56.4 kg)  Height: 5\' 6"  (1.676 m)   Wt Readings from Last 3 Encounters:  08/30/17 124 lb 4 oz (56.4 kg)  08/26/17 125 lb 7.1 oz (56.9 kg)  08/12/17 125 lb 8 oz (56.9 kg)   Lab Results  Component Value Date  CREATININE 0.66 08/26/2017   CREATININE 0.74 08/26/2017   CREATININE 0.83 06/24/2017   Physical Exam  Constitutional: She is oriented to person, place, and time. She appears well-developed and well-nourished.  Pale looking  HENT:  Head: Normocephalic and atraumatic.  Neck: Normal range of motion. Neck supple. No JVD present.  Cardiovascular: Normal rate and regular rhythm.  Pulmonary/Chest: Effort normal. No respiratory distress. She has no wheezes. She has no rhonchi. She has no rales.  Abdominal: Soft. Bowel sounds are normal.  Musculoskeletal:       Right lower leg: She exhibits no tenderness and no edema.       Left lower leg: She exhibits no tenderness and no edema.  Neurological: She is alert and oriented to person, place, and time.  Skin: Skin is warm and dry.  Psychiatric: She has a normal mood and affect. Her behavior is normal.  Nursing note and vitals reviewed.  Assessment & Plan:  1: Chronic heart failure with preserved ejection fraction- - NYHA class III - euvolemic today - weighing daily and says that her weight has been stable. Reminded to call for an overnight weight gain of >2 pounds or  a weekly weight gain of >5 pounds - weight down 1.4 pounds since she was last here ~ 3 weeks ago - not adding salt to her food. Reviewed the importance of reading food labels and closely following a 2000mg  sodium diet.  - remains quite fatigue and more so since recent hospitalization; will get CBC today a she has a history of anemia and looks pale today  - hasn't noticed a change in her energy level with reduction of nadolol but also has been dealing with urinary infection  2: Paroxymal atrial fibrillation- - saw EP Caryl Comes) 06/28/17 - no change in resting HR with decrease in nadolol  - pacemaker present - on flecainide - is hoping to feel well enough to attend grandson's wedding in October  3: HTN- - BP looks good today - saw PCP Ouida Sills) 08/19/17 - BMP 08/26/17 reviewed and showed sodium 134, potassium 3.7 and GFR >60  4: Pyelonephritis- - continues on antibiotics - says that she's taking them with food  Medication list was reviewed.   Return in 1 month or sooner for any questions/problems before then.

## 2017-08-30 NOTE — Patient Instructions (Signed)
Continue weighing daily and call for an overnight weight gain of > 2 pounds or a weekly weight gain of >5 pounds. 

## 2017-09-05 DIAGNOSIS — Z87440 Personal history of urinary (tract) infections: Secondary | ICD-10-CM | POA: Diagnosis not present

## 2017-09-05 DIAGNOSIS — I480 Paroxysmal atrial fibrillation: Secondary | ICD-10-CM | POA: Diagnosis not present

## 2017-09-05 DIAGNOSIS — R55 Syncope and collapse: Secondary | ICD-10-CM | POA: Diagnosis not present

## 2017-09-05 DIAGNOSIS — I959 Hypotension, unspecified: Secondary | ICD-10-CM | POA: Diagnosis not present

## 2017-09-05 DIAGNOSIS — R531 Weakness: Secondary | ICD-10-CM | POA: Diagnosis not present

## 2017-09-06 DIAGNOSIS — R319 Hematuria, unspecified: Secondary | ICD-10-CM | POA: Diagnosis not present

## 2017-09-12 ENCOUNTER — Emergency Department: Payer: Medicare HMO

## 2017-09-12 ENCOUNTER — Inpatient Hospital Stay
Admission: EM | Admit: 2017-09-12 | Discharge: 2017-09-16 | DRG: 312 | Disposition: A | Discharge status: Home Health | Payer: Medicare HMO | Attending: Internal Medicine | Admitting: Internal Medicine

## 2017-09-12 ENCOUNTER — Other Ambulatory Visit: Payer: Self-pay

## 2017-09-12 DIAGNOSIS — M6281 Muscle weakness (generalized): Secondary | ICD-10-CM | POA: Diagnosis not present

## 2017-09-12 DIAGNOSIS — R55 Syncope and collapse: Secondary | ICD-10-CM | POA: Diagnosis not present

## 2017-09-12 DIAGNOSIS — M810 Age-related osteoporosis without current pathological fracture: Secondary | ICD-10-CM | POA: Diagnosis not present

## 2017-09-12 DIAGNOSIS — I480 Paroxysmal atrial fibrillation: Secondary | ICD-10-CM | POA: Diagnosis not present

## 2017-09-12 DIAGNOSIS — D649 Anemia, unspecified: Secondary | ICD-10-CM | POA: Diagnosis present

## 2017-09-12 DIAGNOSIS — Z91040 Latex allergy status: Secondary | ICD-10-CM

## 2017-09-12 DIAGNOSIS — R269 Unspecified abnormalities of gait and mobility: Secondary | ICD-10-CM | POA: Diagnosis present

## 2017-09-12 DIAGNOSIS — W1830XA Fall on same level, unspecified, initial encounter: Secondary | ICD-10-CM | POA: Diagnosis present

## 2017-09-12 DIAGNOSIS — Z8544 Personal history of malignant neoplasm of other female genital organs: Secondary | ICD-10-CM | POA: Diagnosis not present

## 2017-09-12 DIAGNOSIS — Z79899 Other long term (current) drug therapy: Secondary | ICD-10-CM

## 2017-09-12 DIAGNOSIS — I959 Hypotension, unspecified: Secondary | ICD-10-CM

## 2017-09-12 DIAGNOSIS — I495 Sick sinus syndrome: Secondary | ICD-10-CM | POA: Diagnosis present

## 2017-09-12 DIAGNOSIS — Y92000 Kitchen of unspecified non-institutional (private) residence as  the place of occurrence of the external cause: Secondary | ICD-10-CM | POA: Diagnosis not present

## 2017-09-12 DIAGNOSIS — I5032 Chronic diastolic (congestive) heart failure: Secondary | ICD-10-CM | POA: Diagnosis not present

## 2017-09-12 DIAGNOSIS — Z888 Allergy status to other drugs, medicaments and biological substances status: Secondary | ICD-10-CM | POA: Diagnosis not present

## 2017-09-12 DIAGNOSIS — R630 Anorexia: Secondary | ICD-10-CM | POA: Diagnosis not present

## 2017-09-12 DIAGNOSIS — S0990XA Unspecified injury of head, initial encounter: Secondary | ICD-10-CM | POA: Diagnosis not present

## 2017-09-12 DIAGNOSIS — I110 Hypertensive heart disease with heart failure: Secondary | ICD-10-CM | POA: Diagnosis not present

## 2017-09-12 DIAGNOSIS — R0789 Other chest pain: Secondary | ICD-10-CM | POA: Diagnosis present

## 2017-09-12 DIAGNOSIS — Z7901 Long term (current) use of anticoagulants: Secondary | ICD-10-CM

## 2017-09-12 DIAGNOSIS — Z66 Do not resuscitate: Secondary | ICD-10-CM | POA: Diagnosis present

## 2017-09-12 DIAGNOSIS — Z96651 Presence of right artificial knee joint: Secondary | ICD-10-CM | POA: Diagnosis present

## 2017-09-12 DIAGNOSIS — R627 Adult failure to thrive: Secondary | ICD-10-CM | POA: Diagnosis present

## 2017-09-12 DIAGNOSIS — M1990 Unspecified osteoarthritis, unspecified site: Secondary | ICD-10-CM | POA: Diagnosis not present

## 2017-09-12 DIAGNOSIS — I471 Supraventricular tachycardia: Secondary | ICD-10-CM | POA: Diagnosis not present

## 2017-09-12 DIAGNOSIS — R4182 Altered mental status, unspecified: Secondary | ICD-10-CM

## 2017-09-12 DIAGNOSIS — R079 Chest pain, unspecified: Secondary | ICD-10-CM | POA: Diagnosis not present

## 2017-09-12 DIAGNOSIS — S199XXA Unspecified injury of neck, initial encounter: Secondary | ICD-10-CM | POA: Diagnosis not present

## 2017-09-12 DIAGNOSIS — M8580 Other specified disorders of bone density and structure, unspecified site: Secondary | ICD-10-CM | POA: Diagnosis not present

## 2017-09-12 DIAGNOSIS — R2681 Unsteadiness on feet: Secondary | ICD-10-CM | POA: Diagnosis not present

## 2017-09-12 DIAGNOSIS — S299XXA Unspecified injury of thorax, initial encounter: Secondary | ICD-10-CM | POA: Diagnosis not present

## 2017-09-12 DIAGNOSIS — S3991XA Unspecified injury of abdomen, initial encounter: Secondary | ICD-10-CM | POA: Diagnosis not present

## 2017-09-12 DIAGNOSIS — I4892 Unspecified atrial flutter: Secondary | ICD-10-CM | POA: Diagnosis present

## 2017-09-12 DIAGNOSIS — Z950 Presence of cardiac pacemaker: Secondary | ICD-10-CM

## 2017-09-12 DIAGNOSIS — Z471 Aftercare following joint replacement surgery: Secondary | ICD-10-CM | POA: Diagnosis not present

## 2017-09-12 DIAGNOSIS — R531 Weakness: Secondary | ICD-10-CM | POA: Diagnosis not present

## 2017-09-12 DIAGNOSIS — I10 Essential (primary) hypertension: Secondary | ICD-10-CM | POA: Diagnosis not present

## 2017-09-12 DIAGNOSIS — M25559 Pain in unspecified hip: Secondary | ICD-10-CM | POA: Diagnosis not present

## 2017-09-12 DIAGNOSIS — I361 Nonrheumatic tricuspid (valve) insufficiency: Secondary | ICD-10-CM | POA: Diagnosis not present

## 2017-09-12 DIAGNOSIS — S3993XA Unspecified injury of pelvis, initial encounter: Secondary | ICD-10-CM | POA: Diagnosis not present

## 2017-09-12 LAB — TSH: TSH: 5.321 u[IU]/mL — ABNORMAL HIGH (ref 0.350–4.500)

## 2017-09-12 LAB — COMPREHENSIVE METABOLIC PANEL
ALT: 50 U/L — ABNORMAL HIGH (ref 0–44)
ANION GAP: 9 (ref 5–15)
AST: 64 U/L — AB (ref 15–41)
Albumin: 3.2 g/dL — ABNORMAL LOW (ref 3.5–5.0)
Alkaline Phosphatase: 69 U/L (ref 38–126)
BILIRUBIN TOTAL: 0.6 mg/dL (ref 0.3–1.2)
BUN: 20 mg/dL (ref 8–23)
CO2: 21 mmol/L — ABNORMAL LOW (ref 22–32)
Calcium: 8.6 mg/dL — ABNORMAL LOW (ref 8.9–10.3)
Chloride: 103 mmol/L (ref 98–111)
Creatinine, Ser: 0.84 mg/dL (ref 0.44–1.00)
GFR calc Af Amer: >60 mL/min (ref >60)
Glucose, Bld: 138 mg/dL — ABNORMAL HIGH (ref 70–99)
POTASSIUM: 4.7 mmol/L (ref 3.5–5.1)
Sodium: 133 mmol/L — ABNORMAL LOW (ref 135–145)
TOTAL PROTEIN: 6.8 g/dL (ref 6.5–8.1)

## 2017-09-12 LAB — CBC WITH DIFFERENTIAL/PLATELET
BASOS ABS: 0.1 10*3/uL (ref 0–0.1)
BASOS PCT: 1 %
EOS PCT: 4 %
Eosinophils Absolute: 0.2 10*3/uL (ref 0–0.7)
HCT: 30.4 % — ABNORMAL LOW (ref 35.0–47.0)
Hemoglobin: 10.3 g/dL — ABNORMAL LOW (ref 12.0–16.0)
LYMPHS PCT: 27 %
Lymphs Abs: 1.6 10*3/uL (ref 1.0–3.6)
MCH: 30.9 pg (ref 26.0–34.0)
MCHC: 33.7 g/dL (ref 32.0–36.0)
MCV: 91.7 fL (ref 80.0–100.0)
MONO ABS: 0.4 10*3/uL (ref 0.2–0.9)
Monocytes Relative: 7 %
Neutro Abs: 3.7 10*3/uL (ref 1.4–6.5)
Neutrophils Relative %: 61 %
Platelets: 176 10*3/uL (ref 150–440)
RBC: 3.32 MIL/uL — ABNORMAL LOW (ref 3.80–5.20)
RDW: 15.3 % — AB (ref 11.5–14.5)
WBC: 6 10*3/uL (ref 3.6–11.0)

## 2017-09-12 LAB — URINALYSIS, COMPLETE (UACMP) WITH MICROSCOPIC
BILIRUBIN URINE: NEGATIVE
GLUCOSE, UA: NEGATIVE mg/dL
Ketones, ur: 5 mg/dL — AB
Nitrite: NEGATIVE
PH: 6 (ref 5.0–8.0)
PROTEIN: 30 mg/dL — AB
Specific Gravity, Urine: 1.032 — ABNORMAL HIGH (ref 1.005–1.030)

## 2017-09-12 LAB — CK: Total CK: 42 U/L (ref 38–234)

## 2017-09-12 LAB — TROPONIN I: Troponin I: <0.03 ng/mL (ref <0.03)

## 2017-09-12 LAB — PROTIME-INR
INR: 2.15
PROTHROMBIN TIME: 23.8 s — AB (ref 11.4–15.2)

## 2017-09-12 LAB — LIPASE, BLOOD: LIPASE: 23 U/L (ref 11–51)

## 2017-09-12 MED ORDER — DIPHENHYDRAMINE HCL 25 MG PO CAPS: 25.0000 mg | ORAL_CAPSULE | Freq: Every evening | ORAL | Status: DC | PRN

## 2017-09-12 MED ORDER — SODIUM CHLORIDE 0.9 % IV SOLN
1.0000 g | Freq: Once | INTRAVENOUS | Status: AC
  Administered 2017-09-12: 1 g via INTRAVENOUS
  Filled 2017-09-12: qty 10

## 2017-09-12 MED ORDER — IOPAMIDOL (ISOVUE-370) INJECTION 76%
75.0000 mL | Freq: Once | INTRAVENOUS | Status: AC | PRN
  Administered 2017-09-12: 75 mL via INTRAVENOUS

## 2017-09-12 MED ORDER — FLUTICASONE PROPIONATE 50 MCG/ACT NA SUSP
2.0000 | Freq: Every day | NASAL | Status: DC | PRN
  Filled 2017-09-12: qty 16

## 2017-09-12 MED ORDER — ONDANSETRON HCL 4 MG/2ML IJ SOLN
4.0000 mg | Freq: Four times a day (QID) | INTRAMUSCULAR | Status: DC | PRN
  Administered 2017-09-14 – 2017-09-15 (×2): 4 mg via INTRAVENOUS
  Filled 2017-09-12 (×2): qty 2

## 2017-09-12 MED ORDER — KETOROLAC TROMETHAMINE 15 MG/ML IJ SOLN: 15.0000 mg | Freq: Four times a day (QID) | INTRAMUSCULAR | Status: DC | PRN

## 2017-09-12 MED ORDER — FLECAINIDE ACETATE 50 MG PO TABS
75.0000 mg | ORAL_TABLET | Freq: Two times a day (BID) | ORAL | Status: DC
Start: 2017-09-12 — End: 2017-09-14
  Administered 2017-09-12 – 2017-09-13 (×3): 75 mg via ORAL
  Filled 2017-09-12 (×4): qty 2

## 2017-09-12 MED ORDER — WARFARIN SODIUM 2 MG PO TABS
2.0000 mg | ORAL_TABLET | Freq: Two times a day (BID) | ORAL | Status: DC
  Administered 2017-09-12 – 2017-09-13 (×2): 2 mg via ORAL
  Filled 2017-09-12 (×4): qty 1

## 2017-09-12 MED ORDER — WARFARIN - PHYSICIAN DOSING INPATIENT: Freq: Every day | Status: DC

## 2017-09-12 MED ORDER — ACETAMINOPHEN 500 MG PO TABS: 500.0000 mg | ORAL_TABLET | Freq: Three times a day (TID) | ORAL | Status: DC | PRN

## 2017-09-12 MED ORDER — IBUPROFEN 400 MG PO TABS
400.0000 mg | ORAL_TABLET | Freq: Four times a day (QID) | ORAL | Status: DC | PRN
  Administered 2017-09-12: 400 mg via ORAL
  Filled 2017-09-12: qty 1

## 2017-09-12 MED ORDER — AMLODIPINE BESYLATE 5 MG PO TABS
2.5000 mg | ORAL_TABLET | Freq: Every day | ORAL | Status: DC
  Administered 2017-09-13: 2.5 mg via ORAL
  Filled 2017-09-12: qty 1

## 2017-09-12 MED ORDER — ONDANSETRON HCL 4 MG PO TABS: 4.0000 mg | ORAL_TABLET | Freq: Four times a day (QID) | ORAL | Status: DC | PRN

## 2017-09-12 MED ORDER — ACETAMINOPHEN 325 MG PO TABS: 650.0000 mg | ORAL_TABLET | Freq: Four times a day (QID) | ORAL | Status: DC | PRN

## 2017-09-12 MED ORDER — NADOLOL 20 MG PO TABS
10.0000 mg | ORAL_TABLET | Freq: Every day | ORAL | Status: DC
  Administered 2017-09-13: 10 mg via ORAL
  Filled 2017-09-12: qty 1

## 2017-09-12 MED ORDER — CALCIUM GLUCONATE 10 % IV SOLN
INTRAVENOUS | Status: AC
  Filled 2017-09-12: qty 10

## 2017-09-12 MED ORDER — SODIUM CHLORIDE 0.9 % IV SOLN
INTRAVENOUS | Status: DC
  Administered 2017-09-12 – 2017-09-13 (×2): via INTRAVENOUS

## 2017-09-12 MED ORDER — ACETAMINOPHEN 650 MG RE SUPP: 650.0000 mg | Freq: Four times a day (QID) | RECTAL | Status: DC | PRN

## 2017-09-12 MED ORDER — ADULT MULTIVITAMIN W/MINERALS CH
1.0000 | ORAL_TABLET | Freq: Every day | ORAL | Status: DC
  Administered 2017-09-13 – 2017-09-16 (×4): 1 via ORAL
  Filled 2017-09-12 (×4): qty 1

## 2017-09-12 MED ORDER — SODIUM CHLORIDE 0.9 % IV BOLUS
1000.0000 mL | Freq: Once | INTRAVENOUS | Status: AC
  Administered 2017-09-12: 1000 mL via INTRAVENOUS

## 2017-09-12 MED ORDER — IBANDRONATE SODIUM 150 MG PO TABS: 150.0000 mg | ORAL_TABLET | ORAL | Status: DC

## 2017-09-12 NOTE — ED Triage Notes (Addendum)
Pt comes into the ED via EMS from home at the independent living at [facility] where she was found by a maintenance worker,pt had a syncopal episode and reported being in and out of consciousness with fire department,  Pt has a pacemaker. Pt is alert and oriented to name on arrival. Pt is pale and clammy. Pt is c/o left sided chest pain intermittently for the past 2-3 days.pt did arrive with DNR form from home.

## 2017-09-12 NOTE — ED Notes (Signed)
Patient's oxygen saturation dropping to 77% while patient sleeping. Patient complaining of pain in chest with deep inspiration but states pain is the same as when she first arrived. Patient placed on 4L [place]. Primary RN notified.

## 2017-09-12 NOTE — ED Notes (Signed)
Pacemaker interrogation completed 

## 2017-09-12 NOTE — Progress Notes (Signed)

## 2017-09-12 NOTE — H&P (Signed)
[place] [facility] [place] NAME: Alejandra Wiley    MR#:  [id number]  DATE OF BIRTH:  Nov 13, 1932  DATE OF ADMISSION:  09/12/2017  PRIMARY CARE PHYSICIAN: Kirk Ruths, MD   REQUESTING/REFERRING PHYSICIAN:  Orbie Pyo, MD     CHIEF COMPLAINT:   Chief Complaint  Patient presents with  . Loss of Consciousness  . Chest Pain    HISTORY OF PRESENT ILLNESS: Alejandra Wiley  is a 84 y.o. female with a known history of bradycardia status post pacemaker, congestive heart failure,  hypertension, paroxysmal atrial fibrillation who was just hospitalized recently with a UTI and a fall.  Patient today was in her kitchen when she passed out.  She does not recall what happened.  She states that since yesterday she has been feeling dizzy and not feeling good.  Patient also complains of sharp chest pain with deep breathing.  Which started out today as well. PAST MEDICAL HISTORY:   Past Medical History:  Diagnosis Date  . Bradycardia   . Cancer ([place])    vaginal intraepithelial neoplasia  . CHF (congestive heart failure) ([place])   . Hypertension   . Pacemaker 12/06/2003   Medtronic Impulse DTDR01  . Pacemaker 2015  . PAF (paroxysmal atrial fibrillation) ([facility])    a. on Coumadin and flecainide; b. CHADS2VASc 3 (HTN, age x 2, female)  . Sick sinus syndrome ([place])   . Sinoatrial node dysfunction (H[facility])   . Vaginal intraepithelial neoplasia     PAST SURGICAL HISTORY:  Past Surgical History:  Procedure Laterality Date  . APPENDECTOMY    . HERNIA REPAIR     lower abd  . IMAGE GUIDED SINUS SURGERY  2015  . INSERT / REPLACE / REMOVE PACEMAKER  12/06/2003   Medtronic impulse DTDR01  . JOINT REPLACEMENT Right    knee  . KYPHOPLASTY N/A 06/23/2017   Procedure: [id number];  Surgeon: Hessie Knows, MD;  Location: [facility] ORS;  Service: Orthopedics;  Laterality: N/A;  . LEEP  1999  . ORIF HIP FRACTURE Right 02/2016  . PACEMAKER GENERATOR CHANGE N/A  09/18/2012   Procedure: PACEMAKER GENERATOR CHANGE;  Surgeon: Deboraha Sprang, MD;  Location: [facility] CATH LAB;  Service: Cardiovascular;  Laterality: N/A;  . TONSILLECTOMY    . TOTAL KNEE ARTHROPLASTY    . TOTAL KNEE REVISION Right 09/23/2016   Procedure: TOTAL KNEE REVISION-POLYETHYLENE EXCHANGE;  Surgeon: Hessie Knows, MD;  Location: [facility] ORS;  Service: Orthopedics;  Laterality: Right;  Marland Kitchen VAGINAL HYSTERECTOMY      SOCIAL HISTORY:  Social History   Tobacco Use  . Smoking status: Never Smoker  . Smokeless tobacco: Never Used  Substance Use Topics  . Alcohol use: Yes    Alcohol/week: 1.0 standard drinks    Types: 1 Glasses of wine per week    Comment: occasionally    FAMILY HISTORY:  Family History  Problem Relation Age of Onset  . Stroke Mother   . Heart disease Father   . Diabetes Daughter   . Diabetes Son     DRUG ALLERGIES:  Allergies  Allergen Reactions  . Latex Other (See Comments)    blisters  . Other Other (See Comments)    Blisters/ paper tape is Market researcher paper tape is Market researcher paper tape is Ok   . Tape Other (See Comments)    Blisters/ paper tape is Ok  . Xarelto [Rivaroxaban] Other (See Comments)    Gums bleeding and too much bruising  .  Nickel Rash and Other (See Comments)    REVIEW OF SYSTEMS:   CONSTITUTIONAL: No fever, positive fatigue or positive weakness.  EYES: No blurred or double vision.  EARS, NOSE, AND THROAT: No tinnitus or ear pain.  RESPIRATORY: No cough, shortness of breath, wheezing or hemoptysis.  CARDIOVASCULAR: Positive chest pain, orthopnea, edema.  GASTROINTESTINAL: No nausea, vomiting, diarrhea or abdominal pain.  GENITOURINARY: No dysuria, hematuria.  ENDOCRINE: No polyuria, nocturia,  HEMATOLOGY: No anemia, easy bruising or bleeding SKIN: No rash or lesion. MUSCULOSKELETAL: No joint pain or arthritis.   NEUROLOGIC: No tingling, numbness, weakness.  PSYCHIATRY: No anxiety or depression.   MEDICATIONS AT HOME:  Prior to  Admission medications   Medication Sig Start Date End Date Taking? Authorizing Provider  amLODipine (NORVASC) 5 MG tablet Take 2.5 mg by mouth daily.    Yes [provider]  flecainide (TAMBOCOR) 50 MG tablet TAKE 1 AND 1/2 TABLETS TWICE DAILY Patient taking differently: Take 75 mg by mouth 2 (two) times daily.  03/08/17  Yes Deboraha Sprang, MD  ibandronate (BONIVA) 150 MG tablet Take 150 mg by mouth every 30 (thirty) days. Take in the morning with a full glass of water, on an empty stomach, and do not take anything else by mouth or lie down for the next 30 min.   Yes [provider]  Multiple Vitamin (MULTI VITAMIN DAILY PO) Take 1 tablet by mouth daily. Centrum silver   Yes [provider]  nadolol (CORGARD) 20 MG tablet Take 10 mg by mouth daily.    Yes [provider]  warfarin (COUMADIN) 1 MG tablet Take 2 mg by mouth 2 (two) times daily.    Yes [provider]  acetaminophen (TYLENOL) 500 MG tablet Take 500 mg by mouth every 8 (eight) hours as needed for mild pain or headache.    [provider]  diphenhydrAMINE (BENADRYL) 25 mg capsule Take 25 mg by mouth at bedtime as needed for sleep. Takes along with Tylenol     [provider]  fluticasone (FLONASE) 50 MCG/ACT nasal spray Place 2 sprays into both nostrils daily.    [provider]  OVER THE COUNTER MEDICATION Place 1 spray into the nose daily. NeilMed Sinus Rinse    [provider]      PHYSICAL EXAMINATION:   VITAL SIGNS: Blood pressure 120/74, pulse 63, resp. rate 20, height 5\' 6"  (1.676 m), weight 56.2 kg, SpO2 92 %.  GENERAL:  84 y.o.-year-old patient lying in the bed with no acute distress.  EYES: Pupils equal, round, reactive to light and accommodation. No scleral icterus. Extraocular muscles intact.  HEENT: Head atraumatic, normocephalic. Oropharynx and nasopharynx clear.  NECK:  Supple, no jugular venous distention. No thyroid enlargement, no  tenderness.  LUNGS: Normal breath sounds bilaterally, no wheezing, rales,rhonchi or crepitation. No use of accessory muscles of respiration.  CARDIOVASCULAR: S1, S2 normal. No murmurs, rubs, or gallops.  ABDOMEN: Soft, nontender, nondistended. Bowel sounds present. No organomegaly or mass.  EXTREMITIES: No pedal edema, cyanosis, or clubbing.  NEUROLOGIC: Cranial nerves II through XII are intact. Muscle strength 5/5 in all extremities. Sensation intact. Gait not checked.  PSYCHIATRIC: The patient is alert and oriented x 3.  SKIN: No obvious rash, lesion, or ulcer.   LABORATORY PANEL:   CBC Recent Labs  Lab 09/12/17 1401  WBC 6.0  HGB 10.3*  HCT 30.4*  PLT 176  MCV 91.7  MCH 30.9  MCHC 33.7  RDW 15.3*  LYMPHSABS 1.6  MONOABS 0.4  EOSABS 0.2  BASOSABS 0.1   ------------------------------------------------------------------------------------------------------------------  Chemistries  Recent Labs  Lab 09/12/17 1401  NA 133*  K 4.7  CL 103  CO2 21*  GLUCOSE 138*  BUN 20  CREATININE 0.84  CALCIUM 8.6*  AST 64*  ALT 50*  ALKPHOS 69  BILITOT 0.6   ------------------------------------------------------------------------------------------------------------------ estimated creatinine clearance is 45.8 mL/min (by C-G formula based on SCr of 0.84 mg/dL). ------------------------------------------------------------------------------------------------------------------ Recent Labs    09/12/17 1401  TSH 5.321*     Coagulation profile Recent Labs  Lab 09/12/17 1401  INR 2.15   ------------------------------------------------------------------------------------------------------------------- No results for input(s): DDIMER in the last 72 hours. -------------------------------------------------------------------------------------------------------------------  Cardiac Enzymes Recent Labs  Lab 09/12/17 1401  TROPONINI <0.03    ------------------------------------------------------------------------------------------------------------------ Invalid input(s): POCBNP  ---------------------------------------------------------------------------------------------------------------  Urinalysis    Component Value Date/Time   COLORURINE STRAW (A) 08/26/2017 0540   APPEARANCEUR CLEAR (A) 08/26/2017 0540   APPEARANCEUR Clear 09/16/2012 2023   LABSPEC 1.006 08/26/2017 0540   LABSPEC 1.016 09/16/2012 2023   PHURINE 6.0 08/26/2017 0540   GLUCOSEU NEGATIVE 08/26/2017 0540   GLUCOSEU Negative 09/16/2012 2023   HGBUR LARGE (A) 08/26/2017 0540   BILIRUBINUR NEGATIVE 08/26/2017 0540   BILIRUBINUR Negative 09/16/2012 2023   KETONESUR NEGATIVE 08/26/2017 0540   PROTEINUR NEGATIVE 08/26/2017 0540   NITRITE NEGATIVE 08/26/2017 0540   LEUKOCYTESUR NEGATIVE 08/26/2017 0540   LEUKOCYTESUR 1+ 09/16/2012 2023     RADIOLOGY: Dg Chest 1 View  Result Date: 09/12/2017 CLINICAL DATA:  Chest pain following a fall at home today. EXAM: CHEST  1 VIEW COMPARISON:  06/24/2017. FINDINGS: Decreased inspiration. Grossly stable enlarged cardiac silhouette. Stable left subclavian pacemaker leads. Clear lungs with stable mildly prominent interstitial markings. Diffuse osteopenia and mild scoliosis. No fracture or pneumothorax seen. IMPRESSION: Stable cardiomegaly and mild chronic interstitial lung disease. No acute cardiopulmonary disease. Electronically Signed   By: Claudie Revering M.D.   On: 09/12/2017 14:28   Dg Pelvis 1-2 Views  Result Date: 09/12/2017 CLINICAL DATA:  Hip pain following a fall at home today. EXAM: PELVIS - 1-2 VIEW COMPARISON:  None. FINDINGS: Right hip prosthesis in satisfactory position and alignment. Mild left hip degenerative changes. No fracture or dislocation seen. Diffuse osteopenia. IMPRESSION: No fracture or dislocation. Hardware intact. Electronically Signed   By: Claudie Revering M.D.   On: 09/12/2017 14:30   Ct Head  Wo Contrast  Result Date: 09/12/2017 CLINICAL DATA:  Unwitnessed fall. Syncope. Left chest pain for 2-3 days EXAM: CT HEAD WITHOUT CONTRAST CT CERVICAL SPINE WITHOUT CONTRAST TECHNIQUE: Multidetector CT imaging of the head and cervical spine was performed following the standard protocol without intravenous contrast. Multiplanar CT image reconstructions of the cervical spine were also generated. COMPARISON:  08/26/2017 FINDINGS: CT HEAD FINDINGS Brain: No evidence of acute infarction, hemorrhage, hydrocephalus, extra-axial collection or mass lesion/mass effect. Cerebral volume loss. Vascular: Atherosclerotic calcification Skull: Negative Sinuses/Orbits: No evidence of injury. There is mucosal thickening in the paranasal sinuses with changes of maxillary antrostomy on the left at least. CT CERVICAL SPINE FINDINGS Alignment: Normal Skull base and vertebrae: Negative for fracture Soft tissues and spinal canal: No prevertebral fluid or swelling. No visible canal hematoma. 18 mm right thyroid nodule, partially covered, of doubtful clinical significance given patient's comorbidities. Disc levels:  Usual degenerative changes. Upper chest: Biapical pleural based scarring. IMPRESSION: No evidence of acute intracranial or cervical spine injury. Electronically Signed   By: Monte Fantasia M.D.   On: 09/12/2017 15:30   Ct Cervical Spine  Wo Contrast  Result Date: 09/12/2017 CLINICAL DATA:  Unwitnessed fall. Syncope. Left chest pain for 2-3 days EXAM: CT HEAD WITHOUT CONTRAST CT CERVICAL SPINE WITHOUT CONTRAST TECHNIQUE: Multidetector CT imaging of the head and cervical spine was performed following the standard protocol without intravenous contrast. Multiplanar CT image reconstructions of the cervical spine were also generated. COMPARISON:  08/26/2017 FINDINGS: CT HEAD FINDINGS Brain: No evidence of acute infarction, hemorrhage, hydrocephalus, extra-axial collection or mass lesion/mass effect. Cerebral volume loss.  Vascular: Atherosclerotic calcification Skull: Negative Sinuses/Orbits: No evidence of injury. There is mucosal thickening in the paranasal sinuses with changes of maxillary antrostomy on the left at least. CT CERVICAL SPINE FINDINGS Alignment: Normal Skull base and vertebrae: Negative for fracture Soft tissues and spinal canal: No prevertebral fluid or swelling. No visible canal hematoma. 18 mm right thyroid nodule, partially covered, of doubtful clinical significance given patient's comorbidities. Disc levels:  Usual degenerative changes. Upper chest: Biapical pleural based scarring. IMPRESSION: No evidence of acute intracranial or cervical spine injury. Electronically Signed   By: Monte Fantasia M.D.   On: 09/12/2017 15:30    EKG: Orders placed or performed during the hospital encounter of 09/12/17  . EKG 12-Lead  . EKG 12-Lead    IMPRESSION AND PLAN: Patient is 84 year old with syncope and collapse  1. Syncope and collapse : Place on tele Cardiology consult Repeat echo Check orthostatic vital signs  2.  Chest pain sharp in nature very atypical possible musculoskeletal we will cycle enzymes   3.  Hypertension continue amlodipine  4.  History of PAF continue Coumadin pharmacy consult  5.  History of diastolic CHF currently compensated  6.  CODE STATUS DNR  All the records are reviewed and case discussed with ED provider. Management plans discussed with the patient, family and they are in agreement.  CODE STATUS: Code Status History    Date Active Date Inactive Code Status Order ID Comments User Context   08/26/2017 0521 08/27/2017 1640 Full Code [id number]  Lance Coon, MD Inpatient   06/23/2017 1732 06/24/2017 1701 Full Code [id number]  Gladstone Lighter, MD Inpatient   09/23/2016 1825 09/24/2016 1702 Full Code [id number]  Hessie Knows, MD Inpatient    Advance Directive Documentation     Most Recent Value  Type of Advance Directive  Healthcare Power of [place], Living will, Out of  facility DNR (pink MOST or yellow form)  Pre-existing out of facility DNR order (yellow form or pink MOST form)  Yellow form placed in chart (order not valid for inpatient use)  "MOST" Form in Place?  -       TOTAL TIME TAKING CARE OF THIS PATIENT: 55 minutes.    Dustin Flock M.D on 09/12/2017 at 3:33 PM  Between 7am to 6pm - Pager - [phone]  After 6pm go to [email or web address] - password [email or web address]  [facility] Office  [phone]  CC: Primary care physician; Kirk Ruths, MD

## 2017-09-12 NOTE — ED Notes (Signed)
Carol Ada from [facility] called to verify pacemaker interrogation, will fax results.

## 2017-09-12 NOTE — ED Notes (Addendum)
Spoke with Charise Carwin, daughter, about pt's status. Daughter's number is [phone].

## 2017-09-12 NOTE — ED Provider Notes (Signed)
[facility] Emergency Department Provider Note ____________________________________________   First MD Initiated Contact with Patient 09/12/17 1358     (approximate)  I have reviewed the triage vital signs and the nursing notes.   HISTORY  Chief Complaint Loss of Consciousness and Chest Pain   HPI Alejandra Wiley is a 84 y.o. female story of CHF, pacemaker, atrial fibrillation on Coumadin who is presenting to the emergency department after being found down in her kitchen.  Patient does not remember the exact circumstances of the fall but is complaining of central chest pain that is radiating through to her back that she rates as a 6-8 out of 10 and sharp.  Says that the pain has been on and off for the last several days but has been worse today starting this morning and constant.  Denies any shortness of breath.  EMS reported nausea and vomiting with the patient denies.  Denies any diarrhea.  Does not report any blood in her stool.  When EMS arrived the patient had a blood pressure in the 80s.  However, with fluids, the pressure was able to be raised up to the low 100s.  Past Medical History:  Diagnosis Date  . Bradycardia   . 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 CHF (congestive heart failure) ([place])   . Hypertension   . Pacemaker 12/06/2003   Medtronic Impulse DTDR01  . Pacemaker 2015  . PAF (paroxysmal atrial fibrillation) ([facility])    a. on Coumadin and flecainide; b. CHADS2VASc 3 (HTN, age x 2, female)  . Sick sinus syndrome ([place])   . Sinoatrial node dysfunction ([facility])   . Vaginal intraepithelial neoplasia     Patient Active Problem List   Diagnosis Date Noted  . Pyelonephritis 08/26/2017  . Chronic diastolic heart failure ([place]) 07/19/2017  . HTN (hypertension) 07/19/2017  . Painful total knee replacement ([place]) 09/23/2016  . 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 ATRIAL FIBRILLATION 06/13/2008  . PACEMAKER-Medtronic 06/13/2008    Past  Surgical History:  Procedure Laterality Date  . APPENDECTOMY    . HERNIA REPAIR     lower abd  . IMAGE GUIDED SINUS SURGERY  2015  . INSERT / REPLACE / REMOVE PACEMAKER  12/06/2003   Medtronic impulse DTDR01  . JOINT REPLACEMENT Right    knee  . KYPHOPLASTY N/A 06/23/2017   Procedure: [id number];  Surgeon: Hessie Knows, MD;  Location: [facility] ORS;  Service: Orthopedics;  Laterality: N/A;  . LEEP  1999  . ORIF HIP FRACTURE Right 02/2016  . PACEMAKER GENERATOR CHANGE N/A 09/18/2012   Procedure: PACEMAKER GENERATOR CHANGE;  Surgeon: Deboraha Sprang, MD;  Location: [facility] CATH LAB;  Service: Cardiovascular;  Laterality: N/A;  . TONSILLECTOMY    . TOTAL KNEE ARTHROPLASTY    . TOTAL KNEE REVISION Right 09/23/2016   Procedure: TOTAL KNEE REVISION-POLYETHYLENE EXCHANGE;  Surgeon: Hessie Knows, MD;  Location: [facility] ORS;  Service: Orthopedics;  Laterality: Right;  Marland Kitchen VAGINAL HYSTERECTOMY      Prior to Admission medications   Medication Sig Start Date End Date Taking? Authorizing Provider  amLODipine (NORVASC) 5 MG tablet Take 2.5 mg by mouth daily.    Yes [provider]  flecainide (TAMBOCOR) 50 MG tablet TAKE 1 AND 1/2 TABLETS TWICE DAILY Patient taking differently: Take 75 mg by mouth 2 (two) times daily.  03/08/17  Yes Deboraha Sprang, MD  ibandronate (BONIVA) 150 MG tablet Take 150 mg by mouth every 30 (thirty) days. Take in the  morning with a full glass of water, on an empty stomach, and do not take anything else by mouth or lie down for the next 30 min.   Yes [provider]  Multiple Vitamin (MULTI VITAMIN DAILY PO) Take 1 tablet by mouth daily. Centrum silver   Yes [provider]  nadolol (CORGARD) 20 MG tablet Take 10 mg by mouth daily.    Yes [provider]  warfarin (COUMADIN) 1 MG tablet Take 2 mg by mouth 2 (two) times daily.    Yes [provider]  acetaminophen (TYLENOL) 500 MG tablet Take 500 mg by mouth every 8 (eight) hours as needed for  mild pain or headache.    [provider]  diphenhydrAMINE (BENADRYL) 25 mg capsule Take 25 mg by mouth at bedtime as needed for sleep. Takes along with Tylenol     [provider]  fluticasone (FLONASE) 50 MCG/ACT nasal spray Place 2 sprays into both nostrils daily.    [provider]  OVER THE COUNTER MEDICATION Place 1 spray into the nose daily. NeilMed Sinus Rinse    [provider]    Allergies Latex; Other; Tape; Xarelto [rivaroxaban]; and Nickel  Family History  Problem Relation Age of Onset  . Stroke Mother   . Heart disease Father   . Diabetes Daughter   . Diabetes Son     Social History Social History   Tobacco Use  . Smoking status: Never Smoker  . Smokeless tobacco: Never Used  Substance Use Topics  . Alcohol use: Yes    Alcohol/week: 1.0 standard drinks    Types: 1 Glasses of wine per week    Comment: occasionally  . Drug use: No    Review of Systems  Constitutional: No fever/chills Eyes: No visual changes. ENT: No sore throat. Cardiovascular: As above Respiratory: Denies shortness of breath. Gastrointestinal: No abdominal pain.  No nausea, no vomiting.  No diarrhea.  No constipation. Genitourinary: Negative for dysuria. Musculoskeletal: Negative for back pain. Skin: Negative for rash. Neurological: Negative for headaches, focal weakness or numbness.   ____________________________________________   PHYSICAL EXAM:  VITAL SIGNS: ED Triage Vitals  Enc Vitals Group     BP 09/12/17 1356 112/84     Pulse Rate 09/12/17 1356 74     Resp 09/12/17 1356 18     Temp --      Temp src --      SpO2 09/12/17 1356 100 %     Weight 09/12/17 1357 124 lb (56.2 kg)     Height 09/12/17 1357 5\' 6"  (1.676 m)     Head Circumference --      Peak Flow --      Pain Score 09/12/17 1357 7     Pain Loc --      Pain Edu? --      Excl. in [place]? --     Constitutional: Alert and oriented.  Patient appears pale and weak. Eyes:  Conjunctivae are normal.  Head: Atraumatic. Nose: No congestion/rhinnorhea. Mouth/Throat: Mucous membranes are moist.  Neck: No stridor.   Cardiovascular: Normal rate, regular rhythm. Grossly normal heart sounds.  Good peripheral circulation with equal and bilateral radial as well as dorsalis pedis pulses. Respiratory: Normal respiratory effort.  No retractions. Lungs CTAB. Gastrointestinal: Soft and nontender. No distention. Musculoskeletal: No lower extremity tenderness nor edema.  No joint effusions. Neurologic:  Normal speech and language. No gross focal neurologic deficits are appreciated. Skin:  Skin is warm, dry and intact. No  rash noted. Psychiatric: Mood and affect are normal. Speech and behavior are normal.  ____________________________________________   LABS (all labs ordered are listed, but only abnormal results are displayed)  Labs Reviewed  CBC WITH DIFFERENTIAL/PLATELET - Abnormal; Notable for the following components:      Result Value   RBC 3.32 (*)    Hemoglobin 10.3 (*)    HCT 30.4 (*)    RDW 15.3 (*)    All other components within normal limits  COMPREHENSIVE METABOLIC PANEL - Abnormal; Notable for the following components:   Sodium 133 (*)    CO2 21 (*)    Glucose, Bld 138 (*)    Calcium 8.6 (*)    Albumin 3.2 (*)    AST 64 (*)    ALT 50 (*)    All other components within normal limits  TSH - Abnormal; Notable for the following components:   TSH 5.321 (*)    All other components within normal limits  PROTIME-INR - Abnormal; Notable for the following components:   Prothrombin Time 23.8 (*)    All other components within normal limits  LIPASE, BLOOD  TROPONIN I  CK  URINALYSIS, COMPLETE (UACMP) WITH MICROSCOPIC   ____________________________________________  EKG  ED ECG REPORT I, Doran Stabler, the attending physician, personally viewed and interpreted this ECG.   Date: 09/12/2017  EKG Time: 1354  Rate: 96  Rhythm: AV dual paced rhythm.   Intervals:Wide-complex consistent with pacing.  ST&T Change: Does not meet sclerosis criteria.  No significant change from previous except for slower rate which may mimic a sinusoidal type complex.  ____________________________________________  RADIOLOGY  No acute findings on the radiographic studies which include plain films of the chest and pelvis as well as CAT scans of the head, neck, chest abdomen and pelvis. ____________________________________________   PROCEDURES  Procedure(s) performed:  Procedures  Critical Care performed:   ____________________________________________   INITIAL IMPRESSION / ASSESSMENT AND PLAN / ED COURSE  Pertinent labs & imaging results that were available during my care of the patient were reviewed by me and considered in my medical decision making (see chart for details).  Differential diagnosis includes, but is not limited to, ACS, aortic dissection, pulmonary embolism, cardiac tamponade, pneumothorax, pneumonia, pericarditis, myocarditis, GI-related causes including esophagitis/gastritis, and musculoskeletal chest wall pain.   Differential diagnosis includes, but is not limited to, alcohol, illicit or prescription medications, or other toxic ingestion; intracranial pathology such as stroke or intracerebral hemorrhage; fever or infectious causes including sepsis; hypoxemia and/or hypercarbia; uremia; trauma; endocrine related disorders such as diabetes, hypoglycemia, and thyroid-related diseases; hypertensive encephalopathy; etc. As part of my medical decision making, I reviewed the following data within the electronic MEDICAL RECORD NUMBER Notes from prior ED visits  ----------------------------------------- 3:25 PM on 09/12/2017 -----------------------------------------  Patient at this time with largely reassuring lab work.  Pending CT angiography of the chest abdomen and pelvis.  Plan on admission to the hospital.  We will also interrogate her  pacemaker which is a Medtronic.  Blood pressure improved with fluids.  Possible slight altered mental status, fall with hypotension.  Signed out to Dr. Posey Pronto of the medicine service. ____________________________________________   FINAL CLINICAL IMPRESSION(S) / ED DIAGNOSES  Altered mental status.  Hypotension.  NEW MEDICATIONS STARTED DURING THIS VISIT:  New Prescriptions   No medications on file     Note:  This document was prepared using Dragon voice recognition software and may include unintentional dictation errors.     Oprah Camarena, Randall An, MD  09/12/17 1527  

## 2017-09-13 DIAGNOSIS — I471 Supraventricular tachycardia: Secondary | ICD-10-CM

## 2017-09-13 DIAGNOSIS — R0789 Other chest pain: Secondary | ICD-10-CM

## 2017-09-13 DIAGNOSIS — R079 Chest pain, unspecified: Secondary | ICD-10-CM | POA: Diagnosis not present

## 2017-09-13 DIAGNOSIS — I480 Paroxysmal atrial fibrillation: Secondary | ICD-10-CM | POA: Diagnosis not present

## 2017-09-13 DIAGNOSIS — R55 Syncope and collapse: Principal | ICD-10-CM

## 2017-09-13 DIAGNOSIS — I10 Essential (primary) hypertension: Secondary | ICD-10-CM | POA: Diagnosis not present

## 2017-09-13 LAB — CBC
HCT: 27.5 % — ABNORMAL LOW (ref 35.0–47.0)
Hemoglobin: 9.4 g/dL — ABNORMAL LOW (ref 12.0–16.0)
MCH: 30.9 pg (ref 26.0–34.0)
MCHC: 34.1 g/dL (ref 32.0–36.0)
MCV: 90.5 fL (ref 80.0–100.0)
PLATELETS: 139 10*3/uL — AB (ref 150–440)
RBC: 3.04 MIL/uL — AB (ref 3.80–5.20)
RDW: 15 % — AB (ref 11.5–14.5)
WBC: 5.6 10*3/uL (ref 3.6–11.0)

## 2017-09-13 LAB — BASIC METABOLIC PANEL
ANION GAP: 5 (ref 5–15)
BUN: 17 mg/dL (ref 8–23)
CO2: 24 mmol/L (ref 22–32)
Calcium: 8.3 mg/dL — ABNORMAL LOW (ref 8.9–10.3)
Chloride: 108 mmol/L (ref 98–111)
Creatinine, Ser: 0.67 mg/dL (ref 0.44–1.00)
GLUCOSE: 94 mg/dL (ref 70–99)
POTASSIUM: 4.3 mmol/L (ref 3.5–5.1)
SODIUM: 137 mmol/L (ref 135–145)

## 2017-09-13 LAB — PROTIME-INR
INR: 1.66
PROTHROMBIN TIME: 19.5 s — AB (ref 11.4–15.2)

## 2017-09-13 LAB — TROPONIN I

## 2017-09-13 MED ORDER — WARFARIN SODIUM 3 MG PO TABS
3.0000 mg | ORAL_TABLET | Freq: Once | ORAL | Status: AC
  Administered 2017-09-13: 3 mg via ORAL
  Filled 2017-09-13: qty 1

## 2017-09-13 MED ORDER — TRAMADOL HCL 50 MG PO TABS
50.0000 mg | ORAL_TABLET | Freq: Four times a day (QID) | ORAL | Status: DC | PRN
  Administered 2017-09-13: 50 mg via ORAL
  Filled 2017-09-13: qty 1

## 2017-09-13 MED ORDER — WARFARIN SODIUM 2 MG PO TABS
2.0000 mg | ORAL_TABLET | Freq: Two times a day (BID) | ORAL | Status: DC
  Administered 2017-09-14 (×2): 2 mg via ORAL
  Filled 2017-09-13 (×3): qty 1

## 2017-09-13 MED ORDER — NADOLOL 20 MG PO TABS
20.0000 mg | ORAL_TABLET | Freq: Every day | ORAL | Status: DC
  Filled 2017-09-13: qty 1

## 2017-09-13 MED ORDER — WARFARIN - PHARMACIST DOSING INPATIENT: Freq: Every day | Status: DC

## 2017-09-13 NOTE — Consult Note (Signed)
Cardiology Consultation:   Patient ID: Alejandra Wiley; [id number]; 1932-10-06   Admit date: 09/12/2017 Date of Consult: 09/13/2017  Primary Care Provider: Kirk Ruths, MD Primary Cardiologist: Jolyn Nap, MD Primary Electrophysiologist:  Jolyn Nap, MD   Patient Profile:   Alejandra Wiley is a 84 y.o. female with a hx of paroxysmal atrial fibrillation, sick sinus syndrome, and tachybradycardia syndrome status post dual-chamber pacemaker who is being seen today for the evaluation of syncope at the request of Dr. Estanislado Pandy.  History of Present Illness:   [name] reports having considerable fatigue for the last few months.  This began around the time of a T9 compression fracture in June.  She underwent kyphoplasty that was reportedly complicated by transient arrhythmia.  She subsequently followed up with Dr. Caryl Comes who noted a ventricularly paced tachycardia with interrogation showing atrial tachycardia with subsequent dissociation.  She subsequently was hospitalized earlier this month due to a urinary tract infection.  Despite this having been treated, she has remained quite weak.  Over the last few days, she has found it challenging to do any chores around the house.  Yesterday, she was too tired to make lunch and asked that food be delivered to her [place] [facility].  She subsequently fell to the ground while getting a beverage for lunch and passed out for an uncertain length of time.  This was preceded by marked lightheadedness and occasional palpitations.  She denied having chest pain prior to the event, though she had considerable anterior chest wall soreness after coming to.  He was unable to summon help and was ultimately found by security at her assisted living community after she failed to answer the door.  At this time, [name] continues to feel somewhat weak and sore in the anterior chest wall.  She has not had any further palpitations.  She denies shortness of breath.  She has  experienced intermittent leg edema over the last several weeks.  She has not had any bleeding.  She denies prior syncopal episodes, though she has fallen several times, including a mechanical fall last year that resulted in a hip fracture while visiting family in [place].  Past Medical History:  Diagnosis Date  . Bradycardia   . Cancer ([place])    vaginal intraepithelial neoplasia  . CHF (congestive heart failure) ([place])   . Hypertension   . Pacemaker 12/06/2003   Medtronic Impulse DTDR01  . Pacemaker 2015  . PAF (paroxysmal atrial fibrillation) ([facility])    a. on Coumadin and flecainide; b. CHADS2VASc 3 (HTN, age x 2, female)  . Sick sinus syndrome ([place])   . Sinoatrial node dysfunction ([facility])   . Vaginal intraepithelial neoplasia     Past Surgical History:  Procedure Laterality Date  . APPENDECTOMY    . HERNIA REPAIR     lower abd  . IMAGE GUIDED SINUS SURGERY  2015  . INSERT / REPLACE / REMOVE PACEMAKER  12/06/2003   Medtronic impulse DTDR01  . JOINT REPLACEMENT Right    knee  . KYPHOPLASTY N/A 06/23/2017   Procedure: [id number];  Surgeon: Hessie Knows, MD;  Location: [facility] ORS;  Service: Orthopedics;  Laterality: N/A;  . LEEP  1999  . ORIF HIP FRACTURE Right 02/2016  . PACEMAKER GENERATOR CHANGE N/A 09/18/2012   Procedure: PACEMAKER GENERATOR CHANGE;  Surgeon: Deboraha Sprang, MD;  Location: [facility] CATH LAB;  Service: Cardiovascular;  Laterality: N/A;  . TONSILLECTOMY    . TOTAL KNEE ARTHROPLASTY    . TOTAL KNEE REVISION Right  09/23/2016   Procedure: TOTAL KNEE REVISION-POLYETHYLENE EXCHANGE;  Surgeon: Hessie Knows, MD;  Location: [facility] ORS;  Service: Orthopedics;  Laterality: Right;  Marland Kitchen VAGINAL HYSTERECTOMY       Home Medications:  Prior to Admission medications   Medication Sig Start Date [place] Date Taking? Authorizing Provider  amLODipine (NORVASC) 5 MG tablet Take 2.5 mg by mouth daily.    Yes [provider]  flecainide (TAMBOCOR) 50 MG tablet TAKE 1 AND 1/2 TABLETS TWICE  DAILY Patient taking differently: Take 75 mg by mouth 2 (two) times daily.  03/08/17  Yes Deboraha Sprang, MD  ibandronate (BONIVA) 150 MG tablet Take 150 mg by mouth every 30 (thirty) days. Take in the morning with a full glass of water, on an empty stomach, and do not take anything else by mouth or lie down for the next 30 min.   Yes [provider]  Multiple Vitamin (MULTI VITAMIN DAILY PO) Take 1 tablet by mouth daily. Centrum silver   Yes [provider]  nadolol (CORGARD) 20 MG tablet Take 10 mg by mouth daily.    Yes [provider]  warfarin (COUMADIN) 1 MG tablet Take 2 mg by mouth 2 (two) times daily.    Yes [provider]  acetaminophen (TYLENOL) 500 MG tablet Take 500 mg by mouth every 8 (eight) hours as needed for mild pain or headache.    [provider]  diphenhydrAMINE (BENADRYL) 25 mg capsule Take 25 mg by mouth at bedtime as needed for sleep. Takes along with Tylenol     [provider]  fluticasone (FLONASE) 50 MCG/ACT nasal spray Place 2 sprays into both nostrils daily.    [provider]  OVER THE COUNTER MEDICATION Place 1 spray into the nose daily. NeilMed Sinus Rinse    [provider]    Inpatient Medications: Scheduled Meds: . amLODipine  2.5 mg Oral Daily  . flecainide  75 mg Oral BID  . multivitamin with minerals  1 tablet Oral Daily  . nadolol  10 mg Oral Daily  . [START ON 09/14/2017] warfarin  2 mg Oral BID  . warfarin  3 mg Oral ONCE-1800  . Warfarin - Pharmacist Dosing Inpatient   Does not apply q1800   Continuous Infusions: . sodium chloride 50 mL/hr at 09/12/17 1943   PRN Meds: acetaminophen **OR** acetaminophen, diphenhydrAMINE, fluticasone, ondansetron **OR** ondansetron (ZOFRAN) IV, traMADol  Allergies:    Allergies  Allergen Reactions  . Latex Other (See Comments)    blisters  . Other Other (See Comments)    Blisters/ paper tape is Market researcher paper tape is Market researcher  paper tape is Ok   . Tape Other (See Comments)    Blisters/ paper tape is Ok  . Xarelto [Rivaroxaban] Other (See Comments)    Gums bleeding and too much bruising  . Nickel Rash and Other (See Comments)    Social History:   Social History   Socioeconomic History  . Marital status: Married    Spouse name: Not on file  . Number of children: Not on file  . Years of education: Not on file  . Highest education level: Not on file  Occupational History  . Occupation: Part time  Social Needs  . Financial resource strain: Not on file  . Food insecurity:    Worry: Not on file    Inability: Not on file  . Transportation needs:    Medical: Not on file    Non-medical: Not on  file  Tobacco Use  . Smoking status: Never Smoker  . Smokeless tobacco: Never Used  Substance and Sexual Activity  . Alcohol use: Yes    Alcohol/week: 1.0 standard drinks    Types: 1 Glasses of wine per week    Comment: occasionally  . Drug use: No  . Sexual activity: Not Currently  Lifestyle  . Physical activity:    Days per week: Not on file    Minutes per session: Not on file  . Stress: Not on file  Relationships  . Social connections:    Talks on phone: Not on file    Gets together: Not on file    Attends religious service: Not on file    Active member of club or organization: Not on file    Attends meetings of clubs or organizations: Not on file    Relationship status: Not on file  . Intimate partner violence:    Fear of current or ex partner: Not on file    Emotionally abused: Not on file    Physically abused: Not on file    Forced sexual activity: Not on file  Other Topics Concern  . Not on file  Social History Narrative   Pt gets regular exercise.    Family History:   Family History  Problem Relation Age of Onset  . Stroke Mother   . Heart disease Father   . Diabetes Daughter   . Diabetes Son      ROS:  Please see the history of present illness. All other ROS reviewed and negative.      Physical Exam/Data:   Vitals:   09/12/17 2012 09/12/17 2013 09/13/17 0255 09/13/17 0737  BP: 112/76 (!) 135/119 117/78 (!) 158/71  Pulse: 78 82 78 (!) 34  Resp:   18   Temp:   98.3 F (36.8 C) 98.2 F (36.8 C)  TempSrc:   Oral Oral  SpO2:   96% 99%  Weight:   59.4 kg   Height:        Intake/Output Summary (Last 24 hours) at 09/13/2017 1419 Last data filed at 09/13/2017 1132 Gross per 24 hour  Intake 1163.92 ml  Output 1400 ml  Net -236.08 ml   Filed Weights   09/12/17 1357 09/12/17 1734 09/13/17 0255  Weight: 56.2 kg 59.4 kg 59.4 kg   Body mass index is 23.94 kg/m.  General: Pale, elderly woman, lying in bed.  She appears anxious. HEENT: normal Lymph: no adenopathy Neck: no JVD Endocrine:  No thryomegaly Vascular: No carotid bruits; FA pulses 2+ bilaterally without bruits  Cardiac:  normal S1, S2; RRR; no murmurs or rubs Lungs:  clear to auscultation bilaterally, no wheezing, rhonchi or rales  Abd: soft, nontender, no hepatomegaly  Ext: Trace to 1+ ankle edema bilaterally Musculoskeletal:  No deformities, BUE and BLE strength normal and equal.  Anterior chest wall tenderness to mild pressure and with deep inspiration. Skin: warm and dry  Neuro:  CNs 2-12 intact, no focal abnormalities noted Psych:  Normal affect   EKG:  The EKG was personally reviewed and demonstrates:  A-V pacing at 96 bpm. Telemetry:  Telemetry was personally reviewed and demonstrates:  NSR with intermittent ventricular pacing as well as intermittent AV pacing.  Relevant CV Studies: Pacemaker interrogation (09/12/17 - personally reviewed): DDIR (base rate 60 bpm).  Since 06/28/17, 21 VHR episodes and 11,584 AHR episodes (7.1% burden).  Laboratory Data:  Chemistry Recent Labs  Lab 09/12/17 1401 09/13/17 0518  NA 133* 137  K 4.7 4.3  CL 103 108  CO2 21* 24  GLUCOSE 138* 94  BUN 20 17  CREATININE 0.84 0.67  CALCIUM 8.6* 8.3*  GFRNONAA >60 >60  GFRAA >60 >60  ANIONGAP 9 5    Recent  Labs  Lab 09/12/17 1401  PROT 6.8  ALBUMIN 3.2*  AST 64*  ALT 50*  ALKPHOS 69  BILITOT 0.6   Hematology Recent Labs  Lab 09/12/17 1401 09/13/17 0518  WBC 6.0 5.6  RBC 3.32* 3.04*  HGB 10.3* 9.4*  HCT 30.4* 27.5*  MCV 91.7 90.5  MCH 30.9 30.9  MCHC 33.7 34.1  RDW 15.3* 15.0*  PLT 176 139*   Cardiac Enzymes Recent Labs  Lab 09/12/17 1401 09/12/17 1951 09/13/17 0022  TROPONINI <0.03 <0.03 <0.03   No results for input(s): TROPIPOC in the last 168 hours.  BNPNo results for input(s): BNP, PROBNP in the last 168 hours.  DDimer No results for input(s): DDIMER in the last 168 hours.  Radiology/Studies:  Dg Chest 1 View  Result Date: 09/12/2017 CLINICAL DATA:  Chest pain following a fall at home today. EXAM: CHEST  1 VIEW COMPARISON:  06/24/2017. FINDINGS: Decreased inspiration. Grossly stable enlarged cardiac silhouette. Stable left subclavian pacemaker leads. Clear lungs with stable mildly prominent interstitial markings. Diffuse osteopenia and mild scoliosis. No fracture or pneumothorax seen. IMPRESSION: Stable cardiomegaly and mild chronic interstitial lung disease. No acute cardiopulmonary disease. Electronically Signed   By: Claudie Revering M.D.   On: 09/12/2017 14:28   Dg Pelvis 1-2 Views  Result Date: 09/12/2017 CLINICAL DATA:  Hip pain following a fall at home today. EXAM: PELVIS - 1-2 VIEW COMPARISON:  None. FINDINGS: Right hip prosthesis in satisfactory position and alignment. Mild left hip degenerative changes. No fracture or dislocation seen. Diffuse osteopenia. IMPRESSION: No fracture or dislocation. Hardware intact. Electronically Signed   By: Claudie Revering M.D.   On: 09/12/2017 14:30   Ct Head Wo Contrast  Result Date: 09/12/2017 CLINICAL DATA:  Unwitnessed fall. Syncope. Left chest pain for 2-3 days EXAM: CT HEAD WITHOUT CONTRAST CT CERVICAL SPINE WITHOUT CONTRAST TECHNIQUE: Multidetector CT imaging of the head and cervical spine was performed following the standard  protocol without intravenous contrast. Multiplanar CT image reconstructions of the cervical spine were also generated. COMPARISON:  08/26/2017 FINDINGS: CT HEAD FINDINGS Brain: No evidence of acute infarction, hemorrhage, hydrocephalus, extra-axial collection or mass lesion/mass effect. Cerebral volume loss. Vascular: Atherosclerotic calcification Skull: Negative Sinuses/Orbits: No evidence of injury. There is mucosal thickening in the paranasal sinuses with changes of maxillary antrostomy on the left at least. CT CERVICAL SPINE FINDINGS Alignment: Normal Skull base and vertebrae: Negative for fracture Soft tissues and spinal canal: No prevertebral fluid or swelling. No visible canal hematoma. 18 mm right thyroid nodule, partially covered, of doubtful clinical significance given patient's comorbidities. Disc levels:  Usual degenerative changes. Upper chest: Biapical pleural based scarring. IMPRESSION: No evidence of acute intracranial or cervical spine injury. Electronically Signed   By: Monte Fantasia M.D.   On: 09/12/2017 15:30   Ct Cervical Spine Wo Contrast  Result Date: 09/12/2017 CLINICAL DATA:  Unwitnessed fall. Syncope. Left chest pain for 2-3 days EXAM: CT HEAD WITHOUT CONTRAST CT CERVICAL SPINE WITHOUT CONTRAST TECHNIQUE: Multidetector CT imaging of the head and cervical spine was performed following the standard protocol without intravenous contrast. Multiplanar CT image reconstructions of the cervical spine were also generated. COMPARISON:  08/26/2017 FINDINGS: CT HEAD FINDINGS Brain: No evidence of acute infarction, hemorrhage, hydrocephalus, extra-axial collection  or mass lesion/mass effect. Cerebral volume loss. Vascular: Atherosclerotic calcification Skull: Negative Sinuses/Orbits: No evidence of injury. There is mucosal thickening in the paranasal sinuses with changes of maxillary antrostomy on the left at least. CT CERVICAL SPINE FINDINGS Alignment: Normal Skull base and vertebrae: Negative  for fracture Soft tissues and spinal canal: No prevertebral fluid or swelling. No visible canal hematoma. 18 mm right thyroid nodule, partially covered, of doubtful clinical significance given patient's comorbidities. Disc levels:  Usual degenerative changes. Upper chest: Biapical pleural based scarring. IMPRESSION: No evidence of acute intracranial or cervical spine injury. Electronically Signed   By: Monte Fantasia M.D.   On: 09/12/2017 15:30   Ct Angio Chest/abd/pel For Dissection W And/or Wo Contrast  Result Date: 09/12/2017 CLINICAL DATA:  Fall with chest pain EXAM: CT ANGIOGRAPHY CHEST, ABDOMEN AND PELVIS TECHNIQUE: Multidetector CT imaging through the chest, abdomen and pelvis was performed using the standard protocol during bolus administration of intravenous contrast. Multiplanar reconstructed images and MIPs were obtained and reviewed to evaluate the vascular anatomy. CONTRAST:  8mL ISOVUE-370 IOPAMIDOL (ISOVUE-370) INJECTION 76% COMPARISON:  None. FINDINGS: CTA CHEST FINDINGS Cardiovascular: --Heart: The heart size is mildly enlarged, predominantly the right atrium. There is nopericardial effusion. --Aorta: The course and caliber of the thoracic aorta are normal. There is mild aortic atherosclerotic calcification. Precontrast images show no aortic intramural hematoma. There is no blood pool, dissection or penetrating ulcer demonstrated on arterial phase postcontrast imaging. There is a conventional 3 vessel aortic arch branching pattern. The proximal arch vessels are widely patent. --Pulmonary Arteries: Contrast timing is optimized for preferential opacification of the aorta. Within that limitation, normal central pulmonary arteries. Mediastinum/Nodes: No mediastinal, hilar or axillary lymphadenopathy. 1.8 cm hypodense right thyroid nodule. Lungs/Pleura: Small pleural effusions with associated atelectasis. Musculoskeletal: No chest wall abnormality. No acute osseous findings. Review of the MIP  images confirms the above findings. CTA ABDOMEN AND PELVIS FINDINGS VASCULAR Aorta: Normal caliber aorta without aneurysm, dissection, vasculitis or hemodynamically significant stenosis. There is mild aortic atherosclerosis. Celiac: No aneurysm, dissection or hemodynamically significant stenosis. Normal branching pattern. SMA: Mild calcification at the origin without stenosis. Renals: Single renal arteries bilaterally. No aneurysm, dissection, stenosis or evidence of fibromuscular dysplasia. IMA: Proximal narrowing, but patent. Inflow: Minimal external iliac atherosclerosis and moderate internal iliac atherosclerosis. Both proximal femoral arteries are patent. Veins: Normal course and caliber of the major veins. Assessment is otherwise limited by the arterial dominant contrast phase. Review of the MIP images confirms the above findings. NON-VASCULAR Hepatobiliary: Normal hepatic contours and density. No visible biliary dilatation. Normal gallbladder. Pancreas: Normal contours without ductal dilatation. No peripancreatic fluid collection. Spleen: Normal arterial phase splenic enhancement pattern. Adrenals/Urinary Tract: --Adrenal glands: Normal. --Right kidney/ureter: Incomplete ascent of the right kidney. No parenchymal abnormality. --Left kidney/ureter: No hydronephrosis or perinephric stranding. No nephrolithiasis. No obstructing ureteral stones. --Urinary bladder: Unremarkable. Stomach/Bowel: --Stomach/Duodenum: No hiatal hernia or other gastric abnormality. Normal duodenal course and caliber. --Small bowel: No dilatation or inflammation. --Colon: No focal abnormality. --Appendix: Not visualized. No right lower quadrant inflammation or free fluid. Lymphatic: There are multiple subcentimeter para-aortic lymph nodes and a cluster of small nodes superior to the right kidney. No enlarged lymph nodes by CT size criteria. Reproductive: Status post hysterectomy. No adnexal mass. Musculoskeletal. Status post right hip  hemiarthroplasty and T9 vertebral augmentation. No acute abnormality. Particularly osteopenic T6, T8, L1 and L2 vertebral bodies. Other: None. Review of the MIP images confirms the above findings. IMPRESSION: 1. No acute aortic syndrome. 2.  Aortic Atherosclerosis (ICD10-I70.0). 3. Multiple subcentimeter retroperitoneal lymph nodes, uncertain significance. No pathologically enlarged nodes by CT size criteria. 4. Small pleural effusions with associated atelectasis. Electronically Signed   By: Ulyses Jarred M.D.   On: 09/12/2017 15:39    Assessment and Plan:    Syncope Patient reports at least a few weeks of worsening fatigue and dizziness, culminating with episode of syncope yesterday.  She was not orthostatic on presentation.  Device interrogation reveals multiple episodes of elevated atrial and ventricular rates since June, including multiple AHR events yesterday.  I wonder if these may have precipitated her dizziness.  Repeat echo to reassess LVEF and pulmonary artery pressures.  Of note, CTA of the chest yesterday suggest biatrial enlargement  Hydrate with IV fluid and oral hydration, as you are doing.  Hold amlodipine and increase nadolol to 20 mg daily.  We will review further recommendations with Dr. Caryl Comes.  Paroxysmal atrial fibrillation and atrial tachycardia Several HR episodes noted on device interrogation yesterday.  Increase nadolol to 20 mg daily.  Continue flecainide 75 mg twice daily.  Continue anticoagulation with warfarin.  Defer dosing to pharmacy.  Chest pain Most consistent with musculoskeletal etiology after syncope/fall yesterday.  Troponin negative thus far.  Pain control with acetaminophen.  Obtain echo.  As long as LVEF it remains normal without wall motion abnormality.  No ischemia work-up is recommended at this time.  For questions or updates, please contact [place] Please consult [email or web address] for contact info under [place] cardiology.    Signed, Nelva Bush, MD  09/13/2017 2:19 PM

## 2017-09-13 NOTE — Care Management Note (Signed)
Case Management Note  Patient Details  Name: Alejandra Wiley MRN: [id number] Date of Birth: [birth year]  Subjective/Objective:                 Placed in observation for syncope.  Resides in the independent living level of care at [facility].  Patient had home health RN and PT set up at last discharge 8/10 with [facility].  CM asked about current services and patient said they never came.  Spoke with liaison- [place].  It is reported that patient declined home health services.  When informed patient she stated agency told her she did not qualify for services "because I could drive a car."  She is agreeable to home health services and preference is [facility].. CM explained home bound criteria.  She also made mention that her insurance Actor) is not in network with the skilled nursing at [facility] so "I would not be able to be admitted there."   Action/Plan:   Heads up referral  to [facility] for home health nurse and physical therapy.  Expected Discharge Date:                  Expected Discharge Plan:  [place]  In-House Referral:     Discharge planning Services     Post Acute Care Choice:    Choice offered to:     DME Arranged:    DME Agency:     HH Arranged:    [place] Agency:     Status of Service:  In process, will continue to follow  If discussed at Long Length of Stay Meetings, dates discussed:    Additional Comments:  Katrina Stack, RN 09/13/2017, 10:01 AM

## 2017-09-13 NOTE — Evaluation (Signed)
Physical Therapy Evaluation Patient Details Name: Alejandra Wiley MRN: [id number] DOB: 03-16-1932 Today's Date: 09/13/2017   History of Present Illness  Pt is a 84 y.o. female with a known history of bradycardia status post pacemaker, congestive heart failure, hypertension, paroxysmal atrial fibrillation who was just hospitalized recently with a UTI and a fall.  Patient recently was in her kitchen when she passed out.  She does not recall what happened.  She states that she had been feeling dizzy and not feeling good.  Patient also complained of sharp chest pain with deep breathing.  Assessment includes: Syncope, musculoskeletal pain, A-fib, compensated diastolic and heart failure.    Clinical Impression  Pt presents with deficits in strength, transfers, mobility, gait, balance, and activity tolerance.  Pt required extra time and effort with bed mobility tasks but no physical assistance.  Pt was CGA during transfers with good control and stability.  Pt showed minor deficits in unsupported standing balance during balance training and also during amb with A SPC but was always able to self-correct without physical assistance.  Pt ambulated 100' with a SPC before fatiguing and requiring to return to sitting.  Pt's SpO2 remained in the mid 90s throughout the session with HR WNL.  Pt reported no adverse symptoms during the session other than general fatigue.  Pt will benefit from HHPT services upon discharge to safely address above deficits for decreased caregiver assistance and eventual return to PLOF.       Follow Up Recommendations Home health PT    Equipment Recommendations  Rolling walker with 5" wheels;Other (comment)(Pt unsure if she has a RW at home)    Recommendations for Other Services       Precautions / Restrictions Precautions Precautions: Fall Restrictions Weight Bearing Restrictions: No      Mobility  Bed Mobility Overal bed mobility: Modified Independent              General bed mobility comments: Extra time and effort required but no physical assistance needed  Transfers Overall transfer level: Needs assistance Equipment used: Straight cane Transfers: Sit to/from Stand Sit to Stand: Supervision         General transfer comment: Good eccentric and concentric control and stability during transfers  Ambulation/Gait Ambulation/Gait assistance: Min guard Gait Distance (Feet): 100 Feet Assistive device: Straight cane Gait Pattern/deviations: Step-through pattern;Decreased step length - right;Decreased step length - left Gait velocity: Decreased   General Gait Details: Occasional min instability during amb but pt was able to self-correct  Stairs            Wheelchair Mobility    Modified Rankin (Stroke Patients Only)       Balance Overall balance assessment: Mild deficits observed, not formally tested Sitting-balance support: Feet supported;No upper extremity supported Sitting balance-Leahy Scale: Normal       Standing balance-Leahy Scale: Fair Standing balance comment: Min instability in standing with feet together and eyes closed and with feet together, eyes open, and head turns but no physical assistance required to prevent LOB                             Pertinent Vitals/Pain Pain Assessment: No/denies pain    Home Living Family/patient expects to be discharged to:: Private residence Living Arrangements: Alone Available Help at Discharge: Family;Available PRN/intermittently Type of Home: Independent living facility       Home Layout: One level Home Equipment: Cane - single point;Other (comment);Wheelchair -  manual(Unsure if she still has a walker) Additional Comments: husband in long term care at [facility], pt in [place] at [facility]    Prior Function Level of Independence: Independent         Comments: Pt ind with amb mostly without an AD but occasionally with a SPC, current fall from LOC and one  other recent fall from severe dizziness but no other fall history, Ind with ADLs     Hand Dominance   Dominant Hand: Right    Extremity/Trunk Assessment   Upper Extremity Assessment Upper Extremity Assessment: Generalized weakness    Lower Extremity Assessment Lower Extremity Assessment: Generalized weakness       Communication   Communication: No difficulties  Cognition Arousal/Alertness: Awake/alert Behavior During Therapy: WFL for tasks assessed/performed Overall Cognitive Status: Within Functional Limits for tasks assessed                                        General Comments      Exercises Total Joint Exercises Ankle Circles/Pumps: AROM;Both;10 reps Hip ABduction/ADduction: AROM;Both;5 reps Long Arc Quad: AROM;Both;10 reps;5 reps Knee Flexion: AROM;Both;5 reps;10 reps Marching in Standing: AROM;Both;10 reps;Seated;Standing Other Exercises Other Exercises: Static standing balance training with various foot positions and combinations of eyes open/closed and head still/head turns   Assessment/Plan    PT Assessment Patient needs continued PT services  PT Problem List Decreased strength;Decreased activity tolerance;Decreased balance;Decreased mobility       PT Treatment Interventions DME instruction;Gait training;Functional mobility training;Balance training;Therapeutic exercise;Therapeutic activities;Patient/family education    PT Goals (Current goals can be found in the Care Plan section)  Acute Rehab PT Goals Patient Stated Goal: To get stronger PT Goal Formulation: With patient Time For Goal Achievement: 09/26/17 Potential to Achieve Goals: Good    Frequency Min 2X/week   Barriers to discharge        Co-evaluation               AM-PAC PT "6 Clicks" Daily Activity  Outcome Measure Difficulty turning over in bed (including adjusting bedclothes, sheets and blankets)?: A Little Difficulty moving from lying on back to sitting on  the side of the bed? : A Little Difficulty sitting down on and standing up from a chair with arms (e.g., wheelchair, bedside commode, etc,.)?: Unable Help needed moving to and from a bed to chair (including a wheelchair)?: A Little Help needed walking in hospital room?: A Little Help needed climbing 3-5 steps with a railing? : A Little 6 Click Score: 16    End of Session Equipment Utilized During Treatment: Gait belt;Oxygen Activity Tolerance: Patient tolerated treatment well Patient left: in chair;with call bell/phone within reach;with chair alarm set Nurse Communication: Mobility status PT Visit Diagnosis: Unsteadiness on feet (R26.81);Muscle weakness (generalized) (M62.81);Difficulty in walking, not elsewhere classified (R26.2)    Time: 5284-1324 PT Time Calculation (min) (ACUTE ONLY): 33 min   Charges:   PT Evaluation $PT Eval Low Complexity: 1 Low PT Treatments $Therapeutic Exercise: 8-22 mins        D. Royetta Asal PT, DPT 09/13/17, 3:18 PM

## 2017-09-13 NOTE — Progress Notes (Signed)
[place] [facility] [place] NAME: Alejandra Wiley    MR#:  [id number]  DATE OF BIRTH:  06-02-1932  SUBJECTIVE:  CHIEF COMPLAINT:   Chief Complaint  Patient presents with  . Loss of Consciousness  . Chest Pain  Patient seen and evaluated today Has some soreness in the chest Chest discomfort increases on palpation No complaints of any dizziness No new episodes of syncope  REVIEW OF SYSTEMS:    ROS  CONSTITUTIONAL: No documented fever. No fatigue, weakness. No weight gain, no weight loss.  EYES: No blurry or double vision.  ENT: No tinnitus. No postnasal drip. No redness of the oropharynx.  RESPIRATORY: No cough, no wheeze, no hemoptysis. No dyspnea.  CARDIOVASCULAR: Has chest soreness. No orthopnea. No palpitations. No syncope.  GASTROINTESTINAL: No nausea, no vomiting or diarrhea. No abdominal pain. No melena or hematochezia.  GENITOURINARY: No dysuria or hematuria.  ENDOCRINE: No polyuria or nocturia. No heat or cold intolerance.  HEMATOLOGY: No anemia. No bruising. No bleeding.  INTEGUMENTARY: No rashes. No lesions.  MUSCULOSKELETAL: No arthritis. No swelling. No gout.  NEUROLOGIC: No numbness, tingling, or ataxia. No seizure-type activity.  PSYCHIATRIC: No anxiety. No insomnia. No ADD.   DRUG ALLERGIES:   Allergies  Allergen Reactions  . Latex Other (See Comments)    blisters  . Other Other (See Comments)    Blisters/ paper tape is Market researcher paper tape is Market researcher paper tape is Ok   . Tape Other (See Comments)    Blisters/ paper tape is Ok  . Xarelto [Rivaroxaban] Other (See Comments)    Gums bleeding and too much bruising  . Nickel Rash and Other (See Comments)    VITALS:  Blood pressure (!) 158/71, pulse (!) 34, temperature 98.2 F (36.8 C), temperature source Oral, resp. rate 18, height 5\' 2"  (1.575 m), weight 59.4 kg, SpO2 99 %.  PHYSICAL EXAMINATION:   Physical Exam  GENERAL:  84 y.o.-year-old patient lying  in the bed with no acute distress.  EYES: Pupils equal, round, reactive to light and accommodation. No scleral icterus. Extraocular muscles intact.  HEENT: Head atraumatic, normocephalic. Oropharynx and nasopharynx clear.  NECK:  Supple, no jugular venous distention. No thyroid enlargement, no tenderness.  LUNGS: Normal breath sounds bilaterally, no wheezing, rales, rhonchi. No use of accessory muscles of respiration.  Chest wall tender on palpation CARDIOVASCULAR: S1, S2 normal. No murmurs, rubs, or gallops.  ABDOMEN: Soft, nontender, nondistended. Bowel sounds present. No organomegaly or mass.  EXTREMITIES: No cyanosis, clubbing or edema b/l.    NEUROLOGIC: Cranial nerves II through XII are intact. No focal Motor or sensory deficits b/l.   PSYCHIATRIC: The patient is alert and oriented x 3.  SKIN: No obvious rash, lesion, or ulcer.   LABORATORY PANEL:   CBC Recent Labs  Lab 09/13/17 0518  WBC 5.6  HGB 9.4*  HCT 27.5*  PLT 139*   ------------------------------------------------------------------------------------------------------------------ Chemistries  Recent Labs  Lab 09/12/17 1401 09/13/17 0518  NA 133* 137  K 4.7 4.3  CL 103 108  CO2 21* 24  GLUCOSE 138* 94  BUN 20 17  CREATININE 0.84 0.67  CALCIUM 8.6* 8.3*  AST 64*  --   ALT 50*  --   ALKPHOS 69  --   BILITOT 0.6  --    ------------------------------------------------------------------------------------------------------------------  Cardiac Enzymes Recent Labs  Lab 09/13/17 0022  TROPONINI <0.03   ------------------------------------------------------------------------------------------------------------------  RADIOLOGY:  Dg Chest 1 View  Result Date: 09/12/2017 CLINICAL DATA:  Chest pain following a fall at home today. EXAM: CHEST  1 VIEW COMPARISON:  06/24/2017. FINDINGS: Decreased inspiration. Grossly stable enlarged cardiac silhouette. Stable left subclavian pacemaker leads. Clear lungs with  stable mildly prominent interstitial markings. Diffuse osteopenia and mild scoliosis. No fracture or pneumothorax seen. IMPRESSION: Stable cardiomegaly and mild chronic interstitial lung disease. No acute cardiopulmonary disease. Electronically Signed   By: Claudie Revering M.D.   On: 09/12/2017 14:28   Dg Pelvis 1-2 Views  Result Date: 09/12/2017 CLINICAL DATA:  Hip pain following a fall at home today. EXAM: PELVIS - 1-2 VIEW COMPARISON:  None. FINDINGS: Right hip prosthesis in satisfactory position and alignment. Mild left hip degenerative changes. No fracture or dislocation seen. Diffuse osteopenia. IMPRESSION: No fracture or dislocation. Hardware intact. Electronically Signed   By: Claudie Revering M.D.   On: 09/12/2017 14:30   Ct Head Wo Contrast  Result Date: 09/12/2017 CLINICAL DATA:  Unwitnessed fall. Syncope. Left chest pain for 2-3 days EXAM: CT HEAD WITHOUT CONTRAST CT CERVICAL SPINE WITHOUT CONTRAST TECHNIQUE: Multidetector CT imaging of the head and cervical spine was performed following the standard protocol without intravenous contrast. Multiplanar CT image reconstructions of the cervical spine were also generated. COMPARISON:  08/26/2017 FINDINGS: CT HEAD FINDINGS Brain: No evidence of acute infarction, hemorrhage, hydrocephalus, extra-axial collection or mass lesion/mass effect. Cerebral volume loss. Vascular: Atherosclerotic calcification Skull: Negative Sinuses/Orbits: No evidence of injury. There is mucosal thickening in the paranasal sinuses with changes of maxillary antrostomy on the left at least. CT CERVICAL SPINE FINDINGS Alignment: Normal Skull base and vertebrae: Negative for fracture Soft tissues and spinal canal: No prevertebral fluid or swelling. No visible canal hematoma. 18 mm right thyroid nodule, partially covered, of doubtful clinical significance given patient's comorbidities. Disc levels:  Usual degenerative changes. Upper chest: Biapical pleural based scarring. IMPRESSION: No  evidence of acute intracranial or cervical spine injury. Electronically Signed   By: Monte Fantasia M.D.   On: 09/12/2017 15:30   Ct Cervical Spine Wo Contrast  Result Date: 09/12/2017 CLINICAL DATA:  Unwitnessed fall. Syncope. Left chest pain for 2-3 days EXAM: CT HEAD WITHOUT CONTRAST CT CERVICAL SPINE WITHOUT CONTRAST TECHNIQUE: Multidetector CT imaging of the head and cervical spine was performed following the standard protocol without intravenous contrast. Multiplanar CT image reconstructions of the cervical spine were also generated. COMPARISON:  08/26/2017 FINDINGS: CT HEAD FINDINGS Brain: No evidence of acute infarction, hemorrhage, hydrocephalus, extra-axial collection or mass lesion/mass effect. Cerebral volume loss. Vascular: Atherosclerotic calcification Skull: Negative Sinuses/Orbits: No evidence of injury. There is mucosal thickening in the paranasal sinuses with changes of maxillary antrostomy on the left at least. CT CERVICAL SPINE FINDINGS Alignment: Normal Skull base and vertebrae: Negative for fracture Soft tissues and spinal canal: No prevertebral fluid or swelling. No visible canal hematoma. 18 mm right thyroid nodule, partially covered, of doubtful clinical significance given patient's comorbidities. Disc levels:  Usual degenerative changes. Upper chest: Biapical pleural based scarring. IMPRESSION: No evidence of acute intracranial or cervical spine injury. Electronically Signed   By: Monte Fantasia M.D.   On: 09/12/2017 15:30   Ct Angio Chest/abd/pel For Dissection W And/or Wo Contrast  Result Date: 09/12/2017 CLINICAL DATA:  Fall with chest pain EXAM: CT ANGIOGRAPHY CHEST, ABDOMEN AND PELVIS TECHNIQUE: Multidetector CT imaging through the chest, abdomen and pelvis was performed using the standard protocol during bolus administration of intravenous contrast. Multiplanar reconstructed images and MIPs were obtained and reviewed to evaluate the vascular anatomy. CONTRAST:  15mL  ISOVUE-370  IOPAMIDOL (ISOVUE-370) INJECTION 76% COMPARISON:  None. FINDINGS: CTA CHEST FINDINGS Cardiovascular: --Heart: The heart size is mildly enlarged, predominantly the right atrium. There is nopericardial effusion. --Aorta: The course and caliber of the thoracic aorta are normal. There is mild aortic atherosclerotic calcification. Precontrast images show no aortic intramural hematoma. There is no blood pool, dissection or penetrating ulcer demonstrated on arterial phase postcontrast imaging. There is a conventional 3 vessel aortic arch branching pattern. The proximal arch vessels are widely patent. --Pulmonary Arteries: Contrast timing is optimized for preferential opacification of the aorta. Within that limitation, normal central pulmonary arteries. Mediastinum/Nodes: No mediastinal, hilar or axillary lymphadenopathy. 1.8 cm hypodense right thyroid nodule. Lungs/Pleura: Small pleural effusions with associated atelectasis. Musculoskeletal: No chest wall abnormality. No acute osseous findings. Review of the MIP images confirms the above findings. CTA ABDOMEN AND PELVIS FINDINGS VASCULAR Aorta: Normal caliber aorta without aneurysm, dissection, vasculitis or hemodynamically significant stenosis. There is mild aortic atherosclerosis. Celiac: No aneurysm, dissection or hemodynamically significant stenosis. Normal branching pattern. SMA: Mild calcification at the origin without stenosis. Renals: Single renal arteries bilaterally. No aneurysm, dissection, stenosis or evidence of fibromuscular dysplasia. IMA: Proximal narrowing, but patent. Inflow: Minimal external iliac atherosclerosis and moderate internal iliac atherosclerosis. Both proximal femoral arteries are patent. Veins: Normal course and caliber of the major veins. Assessment is otherwise limited by the arterial dominant contrast phase. Review of the MIP images confirms the above findings. NON-VASCULAR Hepatobiliary: Normal hepatic contours and density. No  visible biliary dilatation. Normal gallbladder. Pancreas: Normal contours without ductal dilatation. No peripancreatic fluid collection. Spleen: Normal arterial phase splenic enhancement pattern. Adrenals/Urinary Tract: --Adrenal glands: Normal. --Right kidney/ureter: Incomplete ascent of the right kidney. No parenchymal abnormality. --Left kidney/ureter: No hydronephrosis or perinephric stranding. No nephrolithiasis. No obstructing ureteral stones. --Urinary bladder: Unremarkable. Stomach/Bowel: --Stomach/Duodenum: No hiatal hernia or other gastric abnormality. Normal duodenal course and caliber. --Small bowel: No dilatation or inflammation. --Colon: No focal abnormality. --Appendix: Not visualized. No right lower quadrant inflammation or free fluid. Lymphatic: There are multiple subcentimeter para-aortic lymph nodes and a cluster of small nodes superior to the right kidney. No enlarged lymph nodes by CT size criteria. Reproductive: Status post hysterectomy. No adnexal mass. Musculoskeletal. Status post right hip hemiarthroplasty and T9 vertebral augmentation. No acute abnormality. Particularly osteopenic T6, T8, L1 and L2 vertebral bodies. Other: None. Review of the MIP images confirms the above findings. IMPRESSION: 1. No acute aortic syndrome. 2.  Aortic Atherosclerosis (ICD10-I70.0). 3. Multiple subcentimeter retroperitoneal lymph nodes, uncertain significance. No pathologically enlarged nodes by CT size criteria. 4. Small pleural effusions with associated atelectasis. Electronically Signed   By: Ulyses Jarred M.D.   On: 09/12/2017 15:39     ASSESSMENT AND PLAN:   84 year old female patient with history of pacemaker for bradycardia, congestive heart failure, hypertension, paroxysmal atrial fibrillation, sick sinus syndrome, vaginal cancer currently under hospitalist service for syncope with  -Syncope Cardiac work-up negative so far Pending cardiology evaluation Check  echocardiogram  -Musculoskeletal chest pain Probably secondary to fall Pain management  -Paroxysmal atrial fibrillation Continue Coumadin for anticoagulation  -Compensated diastolic heart failure  -Physical therapy evaluation for balance training  All the records are reviewed and case discussed with Care Management/Social Worker. Management plans discussed with the patient, family and they are in agreement.  CODE STATUS: DNR  DVT Prophylaxis: SCDs  TOTAL TIME TAKING CARE OF THIS PATIENT: 35 minutes.   POSSIBLE D/C IN 1 to 2 DAYS, DEPENDING ON CLINICAL CONDITION.  [name] M.D on 09/13/2017  at 1:53 PM  Between 7am to 6pm - Pager - [phone]  After 6pm go to [email or web address] - password [email or web address] [facility]  Office  [phone]  CC: Primary care physician; Kirk Ruths, MD  Note: This dictation was prepared with Dragon dictation along with smaller phrase technology. Any transcriptional errors that result from this process are unintentional.

## 2017-09-13 NOTE — Plan of Care (Signed)
Patient has complained of chest pain with deep inspiration and while touching her chest. Offered ibuprofen and improvement was noted. Patient has no complaints of dizziness with ambulation.

## 2017-09-13 NOTE — Progress Notes (Signed)
ANTICOAGULATION CONSULT NOTE - Initial Consult  Pharmacy Consult for Warfarin Indication: atrial fibrillation  Allergies  Allergen Reactions  . Latex Other (See Comments)    blisters  . Other Other (See Comments)    Blisters/ paper tape is Market researcher paper tape is Market researcher paper tape is Ok   . Tape Other (See Comments)    Blisters/ paper tape is Ok  . Xarelto [Rivaroxaban] Other (See Comments)    Gums bleeding and too much bruising  . Nickel Rash and Other (See Comments)    Patient Measurements: Height: 5\' 2"  (157.5 cm) Weight: 130 lb 14.4 oz (59.4 kg) IBW/kg (Calculated) : 50.1 Heparin Dosing Weight:    Vital Signs: Temp: 98.2 Wiley (36.8 C) (08/27 0737) Temp Source: Oral (08/27 0737) BP: 158/71 (08/27 0737) Pulse Rate: 34 (08/27 0737)  Labs: Recent Labs    09/12/17 1401 09/12/17 1951 09/13/17 0022 09/13/17 0518 09/13/17 0959  HGB 10.3*  --   --  9.4*  --   HCT 30.4*  --   --  27.5*  --   PLT 176  --   --  139*  --   LABPROT 23.8*  --   --   --  19.5*  INR 2.15  --   --   --  1.66  CREATININE 0.84  --   --  0.67  --   CKTOTAL 42  --   --   --   --   TROPONINI <0.03 <0.03 <0.03  --   --     Estimated Creatinine Clearance: 42.9 mL/min (by C-G formula based on SCr of 0.67 mg/dL).   Medical History: Past Medical History:  Diagnosis Date  . Bradycardia   . Cancer ([place])    vaginal intraepithelial neoplasia  . CHF (congestive heart failure) ([place])   . Hypertension   . Pacemaker Alejandra/18/2005   Medtronic Impulse [id number]  . Pacemaker 2015  . PAF (paroxysmal atrial fibrillation) ([facility])    a. on Coumadin and flecainide; b. CHADS2VASc 3 (HTN, age x 2, female)  . Sick sinus syndrome ([place])   . Sinoatrial node dysfunction ([facility])   . Vaginal intraepithelial neoplasia     Medications:  Scheduled:  . amLODipine  2.5 mg Oral Daily  . flecainide  75 mg Oral BID  . multivitamin with minerals  1 tablet Oral Daily  . nadolol  10 mg Oral Daily  . [START ON  09/14/2017] warfarin  2 mg Oral BID  . warfarin  3 mg Oral ONCE-1800  . Warfarin - Pharmacist Dosing Inpatient   Does not apply q1800    Assessment: 84 yo Wiley on Warfarin 2 mg po BID at home for Afib. Hgb 9.4   Plt 139  8/26  INR  2.15  Warfarin 2 mg at bedtime (Med Rec states pt took Warfarin 8/26 PTA so assuming she took am Warfarin 2 mg also) 8/27  INR  1.66  Goal of Therapy:  INR 2-3 Monitor platelets by anticoagulation protocol: Yes   Plan:  INR subtherapeutic. Will order Warfarin 3 mg po x 1 dose tonight and then resume home dose of 2 mg po BID tomorrow. Wiley/u INR in am  Knoxx Boeding A 09/13/2017,12:43 PM

## 2017-09-13 NOTE — Plan of Care (Signed)

## 2017-09-13 NOTE — Care Management Obs Status (Signed)
[facility] NOTIFICATION   Patient Details  Name: Alejandra Wiley MRN: [id number] Date of Birth: 08/11/1932   Medicare Observation Status Notification Given:  Yes    Elza Rafter, RN 09/13/2017, 3:37 PM

## 2017-09-14 ENCOUNTER — Observation Stay (HOSPITAL_BASED_OUTPATIENT_CLINIC_OR_DEPARTMENT_OTHER)
Admit: 2017-09-14 | Discharge: 2017-09-14 | Disposition: A | Discharge status: Home / Self Care | Payer: Medicare HMO | Attending: Internal Medicine | Admitting: Internal Medicine

## 2017-09-14 DIAGNOSIS — R531 Weakness: Secondary | ICD-10-CM | POA: Diagnosis not present

## 2017-09-14 DIAGNOSIS — R269 Unspecified abnormalities of gait and mobility: Secondary | ICD-10-CM | POA: Diagnosis present

## 2017-09-14 DIAGNOSIS — Z79899 Other long term (current) drug therapy: Secondary | ICD-10-CM | POA: Diagnosis not present

## 2017-09-14 DIAGNOSIS — I959 Hypotension, unspecified: Secondary | ICD-10-CM | POA: Diagnosis present

## 2017-09-14 DIAGNOSIS — Z96651 Presence of right artificial knee joint: Secondary | ICD-10-CM | POA: Diagnosis present

## 2017-09-14 DIAGNOSIS — W1830XA Fall on same level, unspecified, initial encounter: Secondary | ICD-10-CM | POA: Diagnosis present

## 2017-09-14 DIAGNOSIS — I4892 Unspecified atrial flutter: Secondary | ICD-10-CM | POA: Diagnosis present

## 2017-09-14 DIAGNOSIS — I495 Sick sinus syndrome: Secondary | ICD-10-CM | POA: Diagnosis present

## 2017-09-14 DIAGNOSIS — Z66 Do not resuscitate: Secondary | ICD-10-CM | POA: Diagnosis present

## 2017-09-14 DIAGNOSIS — D649 Anemia, unspecified: Secondary | ICD-10-CM | POA: Diagnosis present

## 2017-09-14 DIAGNOSIS — I361 Nonrheumatic tricuspid (valve) insufficiency: Secondary | ICD-10-CM | POA: Diagnosis not present

## 2017-09-14 DIAGNOSIS — R55 Syncope and collapse: Secondary | ICD-10-CM | POA: Diagnosis present

## 2017-09-14 DIAGNOSIS — I5032 Chronic diastolic (congestive) heart failure: Secondary | ICD-10-CM | POA: Diagnosis present

## 2017-09-14 DIAGNOSIS — I110 Hypertensive heart disease with heart failure: Secondary | ICD-10-CM | POA: Diagnosis present

## 2017-09-14 DIAGNOSIS — R0789 Other chest pain: Secondary | ICD-10-CM | POA: Diagnosis present

## 2017-09-14 DIAGNOSIS — Z7901 Long term (current) use of anticoagulants: Secondary | ICD-10-CM | POA: Diagnosis not present

## 2017-09-14 DIAGNOSIS — Z888 Allergy status to other drugs, medicaments and biological substances status: Secondary | ICD-10-CM | POA: Diagnosis not present

## 2017-09-14 DIAGNOSIS — Z91040 Latex allergy status: Secondary | ICD-10-CM | POA: Diagnosis not present

## 2017-09-14 DIAGNOSIS — R630 Anorexia: Secondary | ICD-10-CM

## 2017-09-14 DIAGNOSIS — R627 Adult failure to thrive: Secondary | ICD-10-CM | POA: Diagnosis present

## 2017-09-14 DIAGNOSIS — Z950 Presence of cardiac pacemaker: Secondary | ICD-10-CM | POA: Diagnosis not present

## 2017-09-14 DIAGNOSIS — M8580 Other specified disorders of bone density and structure, unspecified site: Secondary | ICD-10-CM | POA: Diagnosis present

## 2017-09-14 DIAGNOSIS — I480 Paroxysmal atrial fibrillation: Secondary | ICD-10-CM | POA: Diagnosis present

## 2017-09-14 DIAGNOSIS — R4182 Altered mental status, unspecified: Secondary | ICD-10-CM | POA: Diagnosis not present

## 2017-09-14 DIAGNOSIS — Z8544 Personal history of malignant neoplasm of other female genital organs: Secondary | ICD-10-CM | POA: Diagnosis not present

## 2017-09-14 DIAGNOSIS — Y92000 Kitchen of unspecified non-institutional (private) residence as  the place of occurrence of the external cause: Secondary | ICD-10-CM | POA: Diagnosis not present

## 2017-09-14 LAB — ECHOCARDIOGRAM COMPLETE
Height: 62 in
WEIGHTICAEL: 2094.4 [oz_av]

## 2017-09-14 LAB — PROTIME-INR
INR: 1.96
PROTHROMBIN TIME: 22.2 s — AB (ref 11.4–15.2)

## 2017-09-14 MED ORDER — FLECAINIDE ACETATE 100 MG PO TABS: 100.0000 mg | ORAL_TABLET | Freq: Two times a day (BID) | ORAL | 0 refills | Status: DC

## 2017-09-14 MED ORDER — NADOLOL 20 MG PO TABS: 20.0000 mg | ORAL_TABLET | Freq: Two times a day (BID) | ORAL | 0 refills | Status: DC

## 2017-09-14 MED ORDER — FLECAINIDE ACETATE 100 MG PO TABS
100.0000 mg | ORAL_TABLET | Freq: Two times a day (BID) | ORAL | Status: DC
  Administered 2017-09-14 – 2017-09-16 (×5): 100 mg via ORAL
  Filled 2017-09-14 (×6): qty 1

## 2017-09-14 MED ORDER — SODIUM CHLORIDE 0.9% FLUSH
3.0000 mL | Freq: Two times a day (BID) | INTRAVENOUS | Status: DC
  Administered 2017-09-14 – 2017-09-16 (×4): 3 mL via INTRAVENOUS

## 2017-09-14 MED ORDER — METOPROLOL TARTRATE 5 MG/5ML IV SOLN
5.0000 mg | Freq: Once | INTRAVENOUS | Status: AC
  Administered 2017-09-14: 5 mg via INTRAVENOUS
  Filled 2017-09-14: qty 5

## 2017-09-14 MED ORDER — NADOLOL 20 MG PO TABS
20.0000 mg | ORAL_TABLET | Freq: Two times a day (BID) | ORAL | Status: DC
  Administered 2017-09-14 – 2017-09-16 (×5): 20 mg via ORAL
  Filled 2017-09-14 (×6): qty 1

## 2017-09-14 NOTE — Discharge Summary (Addendum)
[facility] [place] NAME: Alejandra Wiley    MR#:  [id number]  DATE OF BIRTH:  1932-06-15  DATE OF ADMISSION:  09/12/2017 ADMITTING PHYSICIAN: Dustin Flock, MD  DATE OF DISCHARGE: 09/14/2017  PRIMARY CARE PHYSICIAN: Kirk Ruths, MD   ADMISSION DIAGNOSIS:  Hypotension, unspecified hypotension type [I95.9] Altered mental status, unspecified altered mental status type [R41.82] Syncope Chest pain Chronic diastolic heart failure Osteopenia DISCHARGE DIAGNOSIS:  Active Problems:   Syncope and collapse Musculoskeletal chest pain Hypertension Paroxysmal atrial fibrillation Osteopenia Chronic diastolic heart failure compensated  SECONDARY DIAGNOSIS:   Past Medical History:  Diagnosis Date  . Bradycardia   . 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 CHF (congestive heart failure) ([place])   . Hypertension   . Pacemaker 12/06/2003   Medtronic Impulse [id number]  . Pacemaker 2015  . PAF (paroxysmal atrial fibrillation) (HCC)    a. on Coumadin and flecainide; b. CHADS2VASc 3 (HTN, age x 2, female)  . Sick sinus syndrome ([place])   . Sinoatrial node dysfunction (HCC)   . Vaginal intraepithelial neoplasia      ADMITTING HISTORY Alejandra Wiley  is a 84 y.o. female with a known history of bradycardia status post pacemaker, congestive heart failure,  hypertension, paroxysmal atrial fibrillation who was just hospitalized recently with a UTI and a fall.  Patient today was in her kitchen when she passed out.  She does not recall what happened.  She states that since yesterday she has been feeling dizzy and not feeling good.  Patient also complains of sharp chest pain with deep breathing.  Which started out today as well.  HOSPITAL COURSE:  Patient was admitted to telemetry and seen by [facility] health cardiology.  Amlodipine was stopped.  Serial troponins were completely negative.  Patient was worked up with CT cervical spine, CT head and  x-ray of the pelvis for fall.  No evidence of any fractures or any acute bleed noted.  She was also worked up with CT angiogram of the chest which showed no pulmonary embolism, rib fractures or any pneumonia.  CTA chest showed small subcentimeter retroperitoneal lymph nodes of uncertain significance. Her chest pain was musculoskeletal in nature and was reproducible and secondary to fall and blunt injury.  The patient during the hospitalization received physical therapy.  Her nadolol was increased to 20 mg orally twice daily and flecainide was increased 200 mg twice daily by cardiology.  EP evaluation as outpatient.  Patient has gait instability and will need physical therapy for endurance training and balance exercises.  She will be discharged to [facility] facility with rehab services.  Disposition plan was also discussed with patient's daughter.  CONSULTS OBTAINED:  Treatment Team:  Wellington Hampshire, MD Larey Dresser, MD End, Harrell Gave, MD  DRUG ALLERGIES:   Allergies  Allergen Reactions  . Latex Other (See Comments)    blisters  . Other Other (See Comments)    Blisters/ paper tape is Market researcher paper tape is Market researcher paper tape is Ok   . Tape Other (See Comments)    Blisters/ paper tape is Ok  . Xarelto [Rivaroxaban] Other (See Comments)    Gums bleeding and too much bruising  . Nickel Rash and Other (See Comments)    DISCHARGE MEDICATIONS:   Allergies as of 09/14/2017      Reactions   Latex Other (See Comments)   blisters   Other Other (See Comments)   Blisters/ paper  tape is Ok Camera operator is Market researcher paper tape is Optometrist (See Comments)   Blisters/ paper tape is Ok   Xarelto [rivaroxaban] Other (See Comments)   Gums bleeding and too much bruising   Nickel Rash, Other (See Comments)      Medication List    STOP taking these medications   amLODipine 5 MG tablet Commonly known as:  NORVASC     TAKE these medications   acetaminophen  500 MG tablet Commonly known as:  TYLENOL Take 500 mg by mouth every 8 (eight) hours as needed for mild pain or headache.   diphenhydrAMINE 25 mg capsule Commonly known as:  BENADRYL Take 25 mg by mouth at bedtime as needed for sleep. Takes along with Tylenol   flecainide 100 MG tablet Commonly known as:  TAMBOCOR Take 1 tablet (100 mg total) by mouth 2 (two) times daily. What changed:    medication strength  See the new instructions.   fluticasone 50 MCG/ACT nasal spray Commonly known as:  FLONASE Place 2 sprays into both nostrils daily.   ibandronate 150 MG tablet Commonly known as:  BONIVA Take 150 mg by mouth every 30 (thirty) days. Take in the morning with a full glass of water, on an empty stomach, and do not take anything else by mouth or lie down for the next 30 min.   MULTI VITAMIN DAILY PO Take 1 tablet by mouth daily. Centrum silver   nadolol 20 MG tablet Commonly known as:  CORGARD Take 1 tablet (20 mg total) by mouth 2 (two) times daily. What changed:    how much to take  when to take this   [place] 1 spray into the nose daily. NeilMed Sinus Rinse   warfarin 1 MG tablet Commonly known as:  COUMADIN Take 2 mg by mouth 2 (two) times daily.     Oxygen via Nasal canula at 2liter  Today  Patient seen and evaluated today No chest pain No shortness of breath No headache, blurry vision Tolerating diet well VITAL SIGNS:  Blood pressure (!) 133/99, pulse 100, temperature 98.1 F (36.7 C), temperature source Oral, resp. rate (!) 22, height 5\' 2"  (1.575 m), weight 59.4 kg, SpO2 90 %.  I/O:    Intake/Output Summary (Last 24 hours) at 09/14/2017 1147 Last data filed at 09/14/2017 1032 Gross per 24 hour  Intake 1696.05 ml  Output 1500 ml  Net 196.05 ml    PHYSICAL EXAMINATION:  Physical Exam  GENERAL:  84 y.o.-year-old patient lying in the bed with no acute distress.  LUNGS: Normal breath sounds bilaterally, no wheezing,  rales,rhonchi or crepitation. No use of accessory muscles of respiration.  CARDIOVASCULAR: S1, S2 normal. No murmurs, rubs, or gallops.  ABDOMEN: Soft, non-tender, non-distended. Bowel sounds present. No organomegaly or mass.  NEUROLOGIC: Moves all 4 extremities. PSYCHIATRIC: The patient is alert and oriented x 3.  SKIN: No obvious rash, lesion, or ulcer.   DATA REVIEW:   CBC Recent Labs  Lab 09/13/17 0518  WBC 5.6  HGB 9.4*  HCT 27.5*  PLT 139*    Chemistries  Recent Labs  Lab 09/12/17 1401 09/13/17 0518  NA 133* 137  K 4.7 4.3  CL 103 108  CO2 21* 24  GLUCOSE 138* 94  BUN 20 17  CREATININE 0.84 0.67  CALCIUM 8.6* 8.3*  AST 64*  --   ALT 50*  --   ALKPHOS 69  --   BILITOT  0.6  --     Cardiac Enzymes Recent Labs  Lab 09/13/17 0022  TROPONINI <0.03    Microbiology Results  Results for orders placed or performed during the hospital encounter of 08/26/17  Urine culture     Status: None   Collection Time: 08/26/17  5:40 AM  Result Value Ref Range Status   Specimen Description   Final    URINE, RANDOM Performed at [facility], [address] [place], [place] [address]    Special Requests   Final    NONE Performed at [facility], [address] [place], [place] [address]    Culture   Final    NO GROWTH Performed at [facility], [place] [address] [place], [place] [address]    Report Status 08/27/2017 FINAL  Final  MRSA PCR Screening     Status: None   Collection Time: 08/26/17  1:21 PM  Result Value Ref Range Status   MRSA by PCR NEGATIVE NEGATIVE Final    Comment:        The GeneXpert MRSA Assay (FDA approved for NASAL specimens only), is one component of a comprehensive MRSA colonization surveillance program. It is not intended to diagnose MRSA infection nor to guide or monitor treatment for MRSA infections. Performed at [facility], [address], [place], [place] [address]     RADIOLOGY:  Dg Chest  1 View  Result Date: 09/12/2017 CLINICAL DATA:  Chest pain following a fall at home today. EXAM: CHEST  1 VIEW COMPARISON:  06/24/2017. FINDINGS: Decreased inspiration. Grossly stable enlarged cardiac silhouette. Stable left subclavian pacemaker leads. Clear lungs with stable mildly prominent interstitial markings. Diffuse osteopenia and mild scoliosis. No fracture or pneumothorax seen. IMPRESSION: Stable cardiomegaly and mild chronic interstitial lung disease. No acute cardiopulmonary disease. Electronically Signed   By: Claudie Revering M.D.   On: 09/12/2017 14:28   Dg Pelvis 1-2 Views  Result Date: 09/12/2017 CLINICAL DATA:  Hip pain following a fall at home today. EXAM: PELVIS - 1-2 VIEW COMPARISON:  None. FINDINGS: Right hip prosthesis in satisfactory position and alignment. Mild left hip degenerative changes. No fracture or dislocation seen. Diffuse osteopenia. IMPRESSION: No fracture or dislocation. Hardware intact. Electronically Signed   By: Claudie Revering M.D.   On: 09/12/2017 14:30   Ct Head Wo Contrast  Result Date: 09/12/2017 CLINICAL DATA:  Unwitnessed fall. Syncope. Left chest pain for 2-3 days EXAM: CT HEAD WITHOUT CONTRAST CT CERVICAL SPINE WITHOUT CONTRAST TECHNIQUE: Multidetector CT imaging of the head and cervical spine was performed following the standard protocol without intravenous contrast. Multiplanar CT image reconstructions of the cervical spine were also generated. COMPARISON:  08/26/2017 FINDINGS: CT HEAD FINDINGS Brain: No evidence of acute infarction, hemorrhage, hydrocephalus, extra-axial collection or mass lesion/mass effect. Cerebral volume loss. Vascular: Atherosclerotic calcification Skull: Negative Sinuses/Orbits: No evidence of injury. There is mucosal thickening in the paranasal sinuses with changes of maxillary antrostomy on the left at least. CT CERVICAL SPINE FINDINGS Alignment: Normal Skull base and vertebrae: Negative for fracture Soft tissues and spinal canal: No  prevertebral fluid or swelling. No visible canal hematoma. 18 mm right thyroid nodule, partially covered, of doubtful clinical significance given patient's comorbidities. Disc levels:  Usual degenerative changes. Upper chest: Biapical pleural based scarring. IMPRESSION: No evidence of acute intracranial or cervical spine injury. Electronically Signed   By: Monte Fantasia M.D.   On: 09/12/2017 15:30   Ct Cervical Spine Wo Contrast  Result Date: 09/12/2017 CLINICAL  DATA:  Unwitnessed fall. Syncope. Left chest pain for 2-3 days EXAM: CT HEAD WITHOUT CONTRAST CT CERVICAL SPINE WITHOUT CONTRAST TECHNIQUE: Multidetector CT imaging of the head and cervical spine was performed following the standard protocol without intravenous contrast. Multiplanar CT image reconstructions of the cervical spine were also generated. COMPARISON:  08/26/2017 FINDINGS: CT HEAD FINDINGS Brain: No evidence of acute infarction, hemorrhage, hydrocephalus, extra-axial collection or mass lesion/mass effect. Cerebral volume loss. Vascular: Atherosclerotic calcification Skull: Negative Sinuses/Orbits: No evidence of injury. There is mucosal thickening in the paranasal sinuses with changes of maxillary antrostomy on the left at least. CT CERVICAL SPINE FINDINGS Alignment: Normal Skull base and vertebrae: Negative for fracture Soft tissues and spinal canal: No prevertebral fluid or swelling. No visible canal hematoma. 18 mm right thyroid nodule, partially covered, of doubtful clinical significance given patient's comorbidities. Disc levels:  Usual degenerative changes. Upper chest: Biapical pleural based scarring. IMPRESSION: No evidence of acute intracranial or cervical spine injury. Electronically Signed   By: Monte Fantasia M.D.   On: 09/12/2017 15:30   Ct Angio Chest/abd/pel For Dissection W And/or Wo Contrast  Result Date: 09/12/2017 CLINICAL DATA:  Fall with chest pain EXAM: CT ANGIOGRAPHY CHEST, ABDOMEN AND PELVIS TECHNIQUE:  Multidetector CT imaging through the chest, abdomen and pelvis was performed using the standard protocol during bolus administration of intravenous contrast. Multiplanar reconstructed images and MIPs were obtained and reviewed to evaluate the vascular anatomy. CONTRAST:  1mL ISOVUE-370 IOPAMIDOL (ISOVUE-370) INJECTION 76% COMPARISON:  None. FINDINGS: CTA CHEST FINDINGS Cardiovascular: --Heart: The heart size is mildly enlarged, predominantly the right atrium. There is nopericardial effusion. --Aorta: The course and caliber of the thoracic aorta are normal. There is mild aortic atherosclerotic calcification. Precontrast images show no aortic intramural hematoma. There is no blood pool, dissection or penetrating ulcer demonstrated on arterial phase postcontrast imaging. There is a conventional 3 vessel aortic arch branching pattern. The proximal arch vessels are widely patent. --Pulmonary Arteries: Contrast timing is optimized for preferential opacification of the aorta. Within that limitation, normal central pulmonary arteries. Mediastinum/Nodes: No mediastinal, hilar or axillary lymphadenopathy. 1.8 cm hypodense right thyroid nodule. Lungs/Pleura: Small pleural effusions with associated atelectasis. Musculoskeletal: No chest wall abnormality. No acute osseous findings. Review of the MIP images confirms the above findings. CTA ABDOMEN AND PELVIS FINDINGS VASCULAR Aorta: Normal caliber aorta without aneurysm, dissection, vasculitis or hemodynamically significant stenosis. There is mild aortic atherosclerosis. Celiac: No aneurysm, dissection or hemodynamically significant stenosis. Normal branching pattern. SMA: Mild calcification at the origin without stenosis. Renals: Single renal arteries bilaterally. No aneurysm, dissection, stenosis or evidence of fibromuscular dysplasia. IMA: Proximal narrowing, but patent. Inflow: Minimal external iliac atherosclerosis and moderate internal iliac atherosclerosis. Both proximal  femoral arteries are patent. Veins: Normal course and caliber of the major veins. Assessment is otherwise limited by the arterial dominant contrast phase. Review of the MIP images confirms the above findings. NON-VASCULAR Hepatobiliary: Normal hepatic contours and density. No visible biliary dilatation. Normal gallbladder. Pancreas: Normal contours without ductal dilatation. No peripancreatic fluid collection. Spleen: Normal arterial phase splenic enhancement pattern. Adrenals/Urinary Tract: --Adrenal glands: Normal. --Right kidney/ureter: Incomplete ascent of the right kidney. No parenchymal abnormality. --Left kidney/ureter: No hydronephrosis or perinephric stranding. No nephrolithiasis. No obstructing ureteral stones. --Urinary bladder: Unremarkable. Stomach/Bowel: --Stomach/Duodenum: No hiatal hernia or other gastric abnormality. Normal duodenal course and caliber. --Small bowel: No dilatation or inflammation. --Colon: No focal abnormality. --Appendix: Not visualized. No right lower quadrant inflammation or free fluid. Lymphatic: There are multiple subcentimeter para-aortic lymph  nodes and a cluster of small nodes superior to the right kidney. No enlarged lymph nodes by CT size criteria. Reproductive: Status post hysterectomy. No adnexal mass. Musculoskeletal. Status post right hip hemiarthroplasty and T9 vertebral augmentation. No acute abnormality. Particularly osteopenic T6, T8, L1 and L2 vertebral bodies. Other: None. Review of the MIP images confirms the above findings. IMPRESSION: 1. No acute aortic syndrome. 2.  Aortic Atherosclerosis (ICD10-I70.0). 3. Multiple subcentimeter retroperitoneal lymph nodes, uncertain significance. No pathologically enlarged nodes by CT size criteria. 4. Small pleural effusions with associated atelectasis. Electronically Signed   By: Ulyses Jarred M.D.   On: 09/12/2017 15:39    Follow up with PCP in 1 week.  Management plans discussed with the patient, family and they are  in agreement.  CODE STATUS: DNR    Code Status Orders  (From admission, onward)         Start     Ordered   09/12/17 1809  Do not attempt resuscitation (DNR)  Continuous    Question Answer Comment  In the event of cardiac or respiratory ARREST Do not call a "code blue"   In the event of cardiac or respiratory ARREST Do not perform Intubation, CPR, defibrillation or ACLS   In the event of cardiac or respiratory ARREST Use medication by any route, position, wound care, and other measures to relive pain and suffering. May use oxygen, suction and manual treatment of airway obstruction as needed for comfort.      09/12/17 1808        Code Status History    Date Active Date Inactive Code Status Order ID Comments User Context   09/12/2017 1611 09/12/2017 1808 DNR [id number]  Dustin Flock, MD ED   08/26/2017 0521 08/27/2017 1640 Full Code [id number]  Lance Coon, MD Inpatient   06/23/2017 1732 06/24/2017 1701 Full Code [id number]  Gladstone Lighter, MD Inpatient   09/23/2016 1825 09/24/2016 1702 Full Code [id number]  Hessie Knows, MD Inpatient    Advance Directive Documentation     Most Recent Value  Type of Advance Directive  Healthcare Power of Attorney  Pre-existing out of facility DNR order (yellow form or pink MOST form)  Yellow form placed in chart (order not valid for inpatient use)  "MOST" Form in Place?  -      TOTAL TIME TAKING CARE OF THIS PATIENT ON DAY OF DISCHARGE: more than 35 minutes.   [name] M.D on 09/14/2017 at 11:47 AM  Between 7am to 6pm - Pager - [phone]  After 6pm go to [email or web address] - password [email or web address] [facility]  Office  [phone]  CC: Primary care physician; Kirk Ruths, MD  Note: This dictation was prepared with Dragon dictation along with smaller phrase technology. Any transcriptional errors that result from this process are unintentional.

## 2017-09-14 NOTE — Progress Notes (Signed)
ANTICOAGULATION CONSULT NOTE - Initial Consult  Pharmacy Consult for Warfarin Indication: atrial fibrillation  Allergies  Allergen Reactions  . Latex Other (See Comments)    blisters  . Other Other (See Comments)    Blisters/ paper tape is Market researcher paper tape is Market researcher paper tape is Ok   . Tape Other (See Comments)    Blisters/ paper tape is Ok  . Xarelto [Rivaroxaban] Other (See Comments)    Gums bleeding and too much bruising  . Nickel Rash and Other (See Comments)    Patient Measurements: Height: 5\' 2"  (157.5 cm) Weight: 130 lb 14.4 oz (59.4 kg) IBW/kg (Calculated) : 50.1 Heparin Dosing Weight:    Vital Signs: Temp: 98.1 F (36.7 C) (08/28 0355) Temp Source: Oral (08/28 0355) BP: 131/83 (08/28 0355) Pulse Rate: 127 (08/28 0355)  Labs: Recent Labs    09/12/17 1401 09/12/17 1951 09/13/17 0022 09/13/17 0518 09/13/17 0959 09/14/17 0636  HGB 10.3*  --   --  9.4*  --   --   HCT 30.4*  --   --  27.5*  --   --   PLT 176  --   --  139*  --   --   LABPROT 23.8*  --   --   --  19.5* 22.2*  INR 2.15  --   --   --  1.66 1.96  CREATININE 0.84  --   --  0.67  --   --   CKTOTAL 42  --   --   --   --   --   TROPONINI <0.03 <0.03 <0.03  --   --   --     Estimated Creatinine Clearance: 42.9 mL/min (by C-G formula based on SCr of 0.67 mg/dL).   Medical History: Past Medical History:  Diagnosis Date  . Bradycardia   . 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 CHF (congestive heart failure) ([place])   . Hypertension   . Pacemaker 12/06/2003   Medtronic Impulse DTDR01  . Pacemaker 2015  . PAF (paroxysmal atrial fibrillation) (HCC)    a. on Coumadin and flecainide; b. CHADS2VASc 3 (HTN, age x 2, female)  . Sick sinus syndrome ([place])   . Sinoatrial node dysfunction (HCC)   . Vaginal intraepithelial neoplasia     Medications:  Scheduled:  . flecainide  75 mg Oral BID  . multivitamin with minerals  1 tablet Oral Daily  . nadolol  20 mg Oral Daily   . warfarin  2 mg Oral BID  . Warfarin - Pharmacist Dosing Inpatient   Does not apply q1800    Assessment: 84 yo F on Warfarin 2 mg po BID at home for Afib. Hgb 9.4   Plt 139  8/26  INR  2.15  Warfarin 2 mg at bedtime (Med Rec states pt took Warfarin 8/26 PTA so assuming she took am Warfarin 2 mg also) 8/27  INR  1.66 8/28  INR  1.96  Goal of Therapy:  INR 2-3 Monitor platelets by anticoagulation protocol: Yes   Plan:  INR almost therapeutic. Continue pt home dose of warfarin 2mg  BID. INR in the AM  Melissa D Maccia, Pharm.D, BCPS Clinical Pharmacist 09/14/2017,7:30 AM

## 2017-09-14 NOTE — Clinical Social Work Note (Signed)
Clinical Social Work Assessment  Patient Details  Name: Alejandra Wiley MRN: [id number] Date of Birth: October 21, 1932  Date of referral:  09/14/17               Reason for consult:  Facility Placement                Permission sought to share information with:  Facility Sport and exercise psychologist, Family Supports Permission granted to share information::  Yes, Verbal Permission Granted  Name::     Charise Carwin Daughter   [phone], or  Andi Devon [phone] or Kathyrn Drown [phone]   Agency::  SNF admissions  Relationship::     Contact Information:     Housing/Transportation Living arrangements for the past 2 months:  [place] of Information:  Patient Patient Interpreter Needed:  None Criminal Activity/Legal Involvement Pertinent to Current Situation/Hospitalization:  No - Comment as needed Significant Relationships:  Adult Children, Neighbor, [employer] Lives with:  Self Do you feel safe going back to the place where you live?  No Need for family participation in patient care:  No (Coment)  Care giving concerns:  Patient feels she needs some short term rehab before she is able to return back home.   Social Worker assessment / plan:  Patient is an 84 year old female who is alert and oriented x4, and lives at [facility] living.  Patient states she has been to rehab in the past and is familiar with how insurance will pay for the stay at SNF.  Patient expressed that her husband is a long term care resident at [facility].  Patient normally lives alone, and she is hopeful that she can receive therapy for a few days and then return back home.     Employment status:  Retired Nurse, adult PT Recommendations:  Home with [place] / Referral to community resources:  [place]  Patient/Family's Response to care: Patient is hopeful that she will not have to be at [facility]  for very long, but she did say she is hopeful to get some strengthening at SNF and then return back home.  Patient/Family's Understanding of and Emotional Response to Diagnosis, Current Treatment, and Prognosis:  Patient expressed that she does not feel like she is safe to return back home without getting some therapy first.  Emotional Assessment Appearance:  Appears stated age Attitude/Demeanor/Rapport:    Affect (typically observed):  Accepting, Pleasant Orientation:  Oriented to Self, Oriented to Place, Oriented to  Time, Oriented to Situation Alcohol / Substance use:  Not Applicable Psych involvement (Current and /or in the community):  No (Comment)  Discharge Needs  Concerns to be addressed:  Lack of Support, Care Coordination Readmission within the last 30 days:  Yes(08-26-17 [facility] independent living.) Current discharge risk:  Lack of support system, Lives alone Barriers to Discharge:  No Barriers Identified   Ross Ludwig, LCSWA 09/14/2017, 4:20 PM

## 2017-09-14 NOTE — Progress Notes (Signed)
Pt in sinus tach in the 120-129 range around 2330. Pt asymptomatic. HR decreased to 100-110's but went back to the 120's. MD Marcille Blanco made aware. Metoprolol IV 5mg  given. Pt HR sustaining in the low 100's at this time.

## 2017-09-14 NOTE — Progress Notes (Signed)
While reviewing the discharge summary and instructions with the patient she stated she felt dizzy and light headed. She was sitting at the edge of the bed at the time.  Got her to a laying position.  BP was 89/67.  Afer laying down for a few minutes her SBP increased to 102, but patient did not feel any better.  Pulse was in the 90s and irregular. Dr.Pyreddy notified, discharge canceled.  Cardiac telemetry was re-established.  Patient is in Atrial Fibrilations

## 2017-09-14 NOTE — Progress Notes (Signed)
[place] [facility] [place] NAME: Alejandra Wiley    MR#:  [id number]  DATE OF BIRTH:  [birth year]  SUBJECTIVE:  CHIEF COMPLAINT:   Chief Complaint  Patient presents with  . Loss of Consciousness  . Chest Pain  Patient seen and evaluated today Felt dizzy and blood pressure dropped when patient stood up Generalized weakness No chest pain  REVIEW OF SYSTEMS:    ROS  CONSTITUTIONAL: No documented fever. Has fatigue, weakness. No weight gain, no weight loss.  EYES: No blurry or double vision.  ENT: No tinnitus. No postnasal drip. No redness of the oropharynx.  RESPIRATORY: No cough, no wheeze, no hemoptysis. No dyspnea.  CARDIOVASCULAR: Has chest soreness. No orthopnea. No palpitations. No syncope.  GASTROINTESTINAL: No nausea, no vomiting or diarrhea. No abdominal pain. No melena or hematochezia.  GENITOURINARY: No dysuria or hematuria.  ENDOCRINE: No polyuria or nocturia. No heat or cold intolerance.  HEMATOLOGY: No anemia. No bruising. No bleeding.  INTEGUMENTARY: No rashes. No lesions.  MUSCULOSKELETAL: No arthritis. No swelling. No gout.  NEUROLOGIC: No numbness, tingling, or ataxia. No seizure-type activity.  Has dizziness. PSYCHIATRIC: No anxiety. No insomnia. No ADD.   DRUG ALLERGIES:   Allergies  Allergen Reactions  . Latex Other (See Comments)    blisters  . Other Other (See Comments)    Blisters/ paper tape is Market researcher paper tape is Market researcher paper tape is Ok   . Tape Other (See Comments)    Blisters/ paper tape is Ok  . Xarelto [Rivaroxaban] Other (See Comments)    Gums bleeding and too much bruising  . Nickel Rash and Other (See Comments)    VITALS:  Blood pressure 102/70, pulse (!) 104, temperature 98.1 F (36.7 C), temperature source Oral, resp. rate (!) 22, height 5\' 2"  (1.575 m), weight 59.4 kg, SpO2 93 %.  PHYSICAL EXAMINATION:   Physical Exam  GENERAL:  84 y.o.-year-old patient lying in the bed with  no acute distress.  EYES: Pupils equal, round, reactive to light and accommodation. No scleral icterus. Extraocular muscles intact.  HEENT: Head atraumatic, normocephalic. Oropharynx and nasopharynx clear.  NECK:  Supple, no jugular venous distention. No thyroid enlargement, no tenderness.  LUNGS: Normal breath sounds bilaterally, no wheezing, rales, rhonchi. No use of accessory muscles of respiration.  Chest wall tender on palpation CARDIOVASCULAR: S1, S2 normal. No murmurs, rubs, or gallops.  ABDOMEN: Soft, nontender, nondistended. Bowel sounds present. No organomegaly or mass.  EXTREMITIES: No cyanosis, clubbing or edema b/l.    NEUROLOGIC: Cranial nerves II through XII are intact. No focal Motor or sensory deficits b/l.   PSYCHIATRIC: The patient is alert and oriented x 3.  SKIN: No obvious rash, lesion, or ulcer.   LABORATORY PANEL:   CBC Recent Labs  Lab 09/13/17 0518  WBC 5.6  HGB 9.4*  HCT 27.5*  PLT 139*   ------------------------------------------------------------------------------------------------------------------ Chemistries  Recent Labs  Lab 09/12/17 1401 09/13/17 0518  NA 133* 137  K 4.7 4.3  CL 103 108  CO2 21* 24  GLUCOSE 138* 94  BUN 20 17  CREATININE 0.84 0.67  CALCIUM 8.6* 8.3*  AST 64*  --   ALT 50*  --   ALKPHOS 69  --   BILITOT 0.6  --    ------------------------------------------------------------------------------------------------------------------  Cardiac Enzymes Recent Labs  Lab 09/13/17 0022  TROPONINI <0.03   ------------------------------------------------------------------------------------------------------------------  RADIOLOGY:  No results found.   ASSESSMENT AND PLAN:   84 year old female patient with  history of pacemaker for bradycardia, congestive heart failure, hypertension, paroxysmal atrial fibrillation, sick sinus syndrome, vaginal cancer currently under hospitalist service for syncope  with  -Syncope/Dizziness Cardiac work-up negative so far EP evaluation as per cardiology  echocardiogram reviewed Monitor BP closely  -Musculoskeletal chest pain Probably secondary to fall Pain management  -Paroxysmal atrial fibrillation Continue Coumadin for anticoagulation Currently on increased dose of nadolol and flecainide for rate control as per cardiology  -Compensated diastolic heart failure Medical mgmt  -Physical therapy evaluation for gait and balance training  -will hold discharge today in view of dizziness and drop in blood pressure  All the records are reviewed and case discussed with Care Management/Social Worker. Management plans discussed with the patient, family and they are in agreement.  CODE STATUS: DNR  DVT Prophylaxis: SCDs  TOTAL TIME TAKING CARE OF THIS PATIENT: 35 minutes.   POSSIBLE D/C IN 1 to 2 DAYS, DEPENDING ON CLINICAL CONDITION.  [name] M.D on 09/14/2017 at 5:35 PM  Between 7am to 6pm - Pager - [phone]  After 6pm go to [email or web address] - password [email or web address] [facility]  Office  [phone]  CC: Primary care physician; Kirk Ruths, MD  Note: This dictation was prepared with Dragon dictation along with smaller phrase technology. Any transcriptional errors that result from this process are unintentional.

## 2017-09-14 NOTE — Progress Notes (Signed)
*  PRELIMINARY RESULTS* Echocardiogram 2D Echocardiogram has been performed.  Alejandra Wiley 09/14/2017, 10:30 AM

## 2017-09-14 NOTE — Clinical Social Work Note (Addendum)
Patient to be d/c'ed today to [facility] room 229.  Patient and family agreeable to plans will transport via neighbor Tamela Oddi, RN to call report [phone]. Patient is on oxygen, now, [facility] asked if patient's neighbor Tamela Oddi, will pick up portable oxygen tank for patient at the reception area at [facility], [place] spoke to [employer] at [phone] who will pick up tank for patient.    Evette Cristal, MSW, [place]

## 2017-09-14 NOTE — Progress Notes (Addendum)
Progress Note  Patient Name: Alejandra Wiley Date of Encounter: 09/14/2017  Primary Cardiologist: Caryl Comes  Subjective   Continues to note profound weakness and lethargy. Not eating well. Has not been out of bed. Developed atrial flutter with RVR vs atrial tach overnight into this morning. Currently in sinus with heart rates in the 70s bpm. Trying to maintain independent living a [facility]. Husband is in [place].   Inpatient Medications    Scheduled Meds: . flecainide  75 mg Oral BID  . multivitamin with minerals  1 tablet Oral Daily  . nadolol  20 mg Oral Daily  . warfarin  2 mg Oral BID  . Warfarin - Pharmacist Dosing Inpatient   Does not apply q1800   Continuous Infusions: . sodium chloride 50 mL/hr at 09/13/17 1553   PRN Meds: acetaminophen **OR** acetaminophen, diphenhydrAMINE, fluticasone, ondansetron **OR** ondansetron (ZOFRAN) IV, traMADol   Vital Signs    Vitals:   09/13/17 1657 09/13/17 1935 09/14/17 0355 09/14/17 0816  BP: (!) 149/59 (!) 154/71 131/83 (!) 133/99  Pulse: 71 76 (!) 127 100  Resp: 16 16 16  (!) 22  Temp: 98.3 F (36.8 C) 99.6 F (37.6 C) 98.1 F (36.7 C)   TempSrc: Oral Oral Oral   SpO2: 95% 94% 93% 93%  Weight:      Height:        Intake/Output Summary (Last 24 hours) at 09/14/2017 0919 Last data filed at 09/14/2017 5916 Gross per 24 hour  Intake 1783.63 ml  Output 1650 ml  Net 133.63 ml   Filed Weights   09/12/17 1357 09/12/17 1734 09/13/17 0255  Weight: 56.2 kg 59.4 kg 59.4 kg    Telemetry    Developed atrial flutter with RVR vs atrial tach overnight into this morning with underlying bundle branch block, currently in the 70s bpm - Personally Reviewed  ECG    n/a - Personally Reviewed  Physical Exam   GEN: Frail and chronically ill appearing; No acute distress.   Neck: No JVD. Cardiac: RRR, II/VI systolic murmur at the apex, no rubs, or gallops.  Respiratory: Clear to auscultation bilaterally.  GI: Soft, nontender,  non-distended.   MS: No edema; No deformity. Neuro:  Alert and oriented x 3; Nonfocal.  Psych: Normal affect.  Labs    Chemistry Recent Labs  Lab 09/12/17 1401 09/13/17 0518  NA 133* 137  K 4.7 4.3  CL 103 108  CO2 21* 24  GLUCOSE 138* 94  BUN 20 17  CREATININE 0.84 0.67  CALCIUM 8.6* 8.3*  PROT 6.8  --   ALBUMIN 3.2*  --   AST 64*  --   ALT 50*  --   ALKPHOS 69  --   BILITOT 0.6  --   GFRNONAA >60 >60  GFRAA >60 >60  ANIONGAP 9 5     Hematology Recent Labs  Lab 09/12/17 1401 09/13/17 0518  WBC 6.0 5.6  RBC 3.32* 3.04*  HGB 10.3* 9.4*  HCT 30.4* 27.5*  MCV 91.7 90.5  MCH 30.9 30.9  MCHC 33.7 34.1  RDW 15.3* 15.0*  PLT 176 139*    Cardiac Enzymes Recent Labs  Lab 09/12/17 1401 09/12/17 1951 09/13/17 0022  TROPONINI <0.03 <0.03 <0.03   No results for input(s): TROPIPOC in the last 168 hours.   BNPNo results for input(s): BNP, PROBNP in the last 168 hours.   DDimer No results for input(s): DDIMER in the last 168 hours.   Radiology    Dg Chest 1  View  Result Date: 09/12/2017 IMPRESSION: Stable cardiomegaly and mild chronic interstitial lung disease. No acute cardiopulmonary disease. Electronically Signed   By: Claudie Revering M.D.   On: 09/12/2017 14:28   Dg Pelvis 1-2 Views  Result Date: 09/12/2017 IMPRESSION: No fracture or dislocation. Hardware intact. Electronically Signed   By: Claudie Revering M.D.   On: 09/12/2017 14:30   Ct Head Wo Contrast  Result Date: 09/12/2017 IMPRESSION: No evidence of acute intracranial or cervical spine injury. Electronically Signed   By: Monte Fantasia M.D.   On: 09/12/2017 15:30   Ct Cervical Spine Wo Contrast  Result Date: 09/12/2017 IMPRESSION: No evidence of acute intracranial or cervical spine injury. Electronically Signed   By: Monte Fantasia M.D.   On: 09/12/2017 15:30   Ct Angio Chest/abd/pel For Dissection W And/or Wo Contrast  Result Date: 09/12/2017 IMPRESSION: 1. No acute aortic syndrome. 2.  Aortic  Atherosclerosis (ICD10-I70.0). 3. Multiple subcentimeter retroperitoneal lymph nodes, uncertain significance. No pathologically enlarged nodes by CT size criteria. 4. Small pleural effusions with associated atelectasis. Electronically Signed   By: Ulyses Jarred M.D.   On: 09/12/2017 15:39    Cardiac Studies   2-D Echo 06/2017: Study Conclusions  - Left ventricle: The cavity size was normal. Systolic function was   normal. The estimated ejection fraction was in the range of 50%   to 55%. Wall motion was normal; there were no regional wall   motion abnormalities. Features are consistent with a pseudonormal   left ventricular filling pattern, with concomitant abnormal   relaxation and increased filling pressure (grade 2 diastolic   dysfunction). - Mitral valve: There was mild to moderate regurgitation. - Left atrium: The atrium was normal in size. - Right ventricle: Systolic function was normal. - Right atrium: The atrium was mildly dilated. - Pulmonary arteries: Systolic pressure was mildly elevated. PA   peak pressure: 44 mm Hg (S). - Inferior vena cava: The vessel was normal in size. The   respirophasic diameter changes were in the normal range (>= 50%),   consistent with normal central venous pressure.  Patient Profile     84 y.o. female with history of paroxysmal atrial fibrillation on Coumadin, sick sinus syndrome, and tachybradycardia syndrome status post dual-chamber pacemaker who is being seen today for the evaluation of syncope.  Assessment & Plan    1. Syncope/dizziness: -Possibly in the setting of underlying elevated elevated atrial and ventricular rates since June noted on her device interrogation  -Cannot exclude some component of poor PO intake as well though -Not orthostatic  -Stop IV fluids -Repeat echo -EP to see in rounds on 8/29 if she remains admitted  2. PAF/atrial tachycardia: -Continues to have brake through episodes -Increase nadolol to 20 mg bid and  Flecainide to 100 mg bid -Remains on Coumadin per pharmacy  -EP to see in rounds on 8/29 as above, is still admitted   3. Chest pain: -Currently symptom free -Ruled out -Repeat echo is pending as above -As long as LVEF it remains normal without wall motion abnormality,  would not recommend any ischemia work-up at this time  4. Anemia: -Possibly dilutional  -Stop IV fluids -If HGB remains low or continues to drop with IV fluids being held she needs additional work up per IM -Monitor while on Coumadin  5. Possible failure to thrive/weakness/lethargy: -Poor PO intake -Needs PT evaluation  -May need rehab at discharge  -Albumin low in the 2.9 to 3.2 range -Consider dietary consult per IM -May  need to consider leaving independent living as this is a lot for her and she does not have close family  For questions or updates, please contact [place] Please consult [email or web address] for contact info under Cardiology/STEMI.    Signed, Christell Faith, PA-C [place] Pager: [phone] 09/14/2017, 9:19 AM   Attending Note Patient seen and examined, agree with detailed note above,  Patient presentation and plan discussed on rounds.   Feels very weak Has not been out of bed,  Run of tachycardia regular rate 120 bpm overnight concerning for flutter or atrial tachycardia Back to normal sinus rhythm this morning She reports that she slept through the tachycardia and did not realize till nursing told her -Long discussion concerning her living situation at home. There is by herself independently tries to do driving and groceries herself  husband is 60 years old and lives in assisted living She has significant gait instability  On examination she is very thin supine in bed no distress appears anxious,  No JVD lungs clear to auscultation bilaterally heart sounds regular normal S1-S2 no murmurs appreciated, abdomen soft nontender no significant lower extremity edema  Lab work  reviewed showing potassium 4.3 creatinine 0.67 sodium 137 hematocrit dropping 27 Hematocrit on arrival 33, then down to 30 now down to 27  A/P Paroxysmal atrial flutter/fibrillation Episode last night lasting for several hours rate 120 bpm Unable to exclude atrial tachycardia Recommend we increase flecainide up to 100 mg twice daily, increase nadolol up to 20 mg twice daily Blood pressure stable On warfarin  Anemia Unclear if this is dilutional Hematocrit 33 down to 30 now down to 27 We will need to monitor, iron studies Concern for malnutrition  Failure to thrive Very weak,  Does driving and groceries by herself but having difficulty Likely losing weight, very thin on exam No family nearby, husband 71 years old and unable to help her May need higher level of care at [place] than 50% was spent in counseling and coordination of care with patient Total encounter time 35 minutes or more   Signed: Esmond Plants  M.D., Ph.D. [facility]

## 2017-09-14 NOTE — Care Management (Signed)
During the process of discharge, patient's blood pressure "dropped into the 80's" and the discharge was cancelled.

## 2017-09-14 NOTE — NC FL2 (Signed)
[place] LEVEL OF CARE SCREENING TOOL     IDENTIFICATION  Patient Name: Alejandra Wiley Birthdate: 07-07-1932 Sex: female Admission Date (Current Location): 09/12/2017  [place] and [place] Number:  Engineering geologist and Address:  [facility], [address] [place], [place] [address]      Provider Number: [id number]  Attending Physician Name and Address:  [name], MD  Relative Name and Phone Number:       Current Level of Care: Hospital Recommended Level of Care: [place] Prior Approval Number:    Date Approved/Denied:   PASRR Number: [id number]  Discharge Plan: SNF    Current Diagnoses: Patient Active Problem List   Diagnosis Date Noted  . Syncope and collapse 09/12/2017  . Pyelonephritis 08/26/2017  . Chronic diastolic heart failure ([place]) 07/19/2017  . HTN (hypertension) 07/19/2017  . Painful total knee replacement ([place]) 09/23/2016  . 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 ATRIAL FIBRILLATION 06/13/2008  . PACEMAKER-[employer] 06/13/2008    Orientation RESPIRATION BLADDER Height & Weight     Self, Time, Situation, Place  O2(2L) Continent Weight: 130 lb 14.4 oz (59.4 kg) Height:  5\' 2"  (157.5 cm)  BEHAVIORAL SYMPTOMS/MOOD NEUROLOGICAL BOWEL NUTRITION STATUS      Continent    AMBULATORY STATUS COMMUNICATION OF NEEDS Skin   Limited Assist Verbally Normal                       Personal Care Assistance Level of Assistance  Bathing, Feeding, Dressing Bathing Assistance: Limited assistance Feeding assistance: Independent Dressing Assistance: Limited assistance     Functional Limitations Info  Sight, Hearing, Speech Sight Info: Adequate Hearing Info: Adequate Speech Info: Adequate    SPECIAL CARE FACTORS FREQUENCY  PT (By licensed PT), OT (By licensed OT)     PT Frequency: 5x a week OT Frequency: 5x a week            Contractures      Additional Factors Info  Code Status,  Allergies, Isolation Precautions Code Status Info: DNR Allergies Info: LATEX, OTHER, TAPE, XARELTO RIVAROXABAN, NICKEL      Isolation Precautions Info: MRSA isolation due to history     Current Medications (09/14/2017):  This is the current hospital active medication list Current Facility-Administered Medications  Medication Dose Route Frequency Provider Last Rate Last Dose  . acetaminophen (TYLENOL) tablet 650 mg  650 mg Oral Q6H PRN Dustin Flock, MD       Or  . acetaminophen (TYLENOL) suppository 650 mg  650 mg Rectal Q6H PRN Dustin Flock, MD      . diphenhydrAMINE (BENADRYL) capsule 25 mg  25 mg Oral QHS PRN Dustin Flock, MD      . flecainide [facility]) tablet 100 mg  100 mg Oral BID Christell Faith M, PA-C   100 mg at 09/14/17 0931  . fluticasone (FLONASE) 50 MCG/ACT nasal spray 2 spray  2 spray Each Nare Daily PRN Dustin Flock, MD      . multivitamin with minerals tablet 1 tablet  1 tablet Oral Daily Dustin Flock, MD   1 tablet at 09/14/17 0930  . nadolol (CORGARD) tablet 20 mg  20 mg Oral BID Christell Faith M, PA-C   20 mg at 09/14/17 0930  . ondansetron (ZOFRAN) tablet 4 mg  4 mg Oral Q6H PRN Dustin Flock, MD       Or  . ondansetron [facility]) injection 4 mg  4 mg Intravenous Q6H PRN Dustin Flock,  MD   4 mg at 09/14/17 0416  . sodium chloride flush (NS) 0.9 % injection 3 mL  3 mL Intravenous Q12H [name], MD   3 mL at 09/14/17 0931  . traMADol (ULTRAM) tablet 50 mg  50 mg Oral Q6H PRN [name], MD   50 mg at 09/13/17 2230  . warfarin (COUMADIN) tablet 2 mg  2 mg Oral BID [name], MD   2 mg at 09/14/17 0931  . Warfarin - Pharmacist Dosing Inpatient   Does not apply [id number] [name], MD         Discharge Medications: Please see discharge summary for a list of discharge medications.  Relevant Imaging Results:  Relevant Lab Results:   Additional Information SSN [id number]  Ross Ludwig, [place]

## 2017-09-15 LAB — PROTIME-INR
INR: 1.91
PROTHROMBIN TIME: 21.7 s — AB (ref 11.4–15.2)

## 2017-09-15 LAB — BASIC METABOLIC PANEL
Anion gap: 9 (ref 5–15)
BUN: 15 mg/dL (ref 8–23)
CALCIUM: 8.5 mg/dL — AB (ref 8.9–10.3)
CO2: 25 mmol/L (ref 22–32)
CREATININE: 0.72 mg/dL (ref 0.44–1.00)
Chloride: 100 mmol/L (ref 98–111)
Glucose, Bld: 122 mg/dL — ABNORMAL HIGH (ref 70–99)
Potassium: 4.1 mmol/L (ref 3.5–5.1)
SODIUM: 134 mmol/L — AB (ref 135–145)

## 2017-09-15 MED ORDER — WARFARIN SODIUM 3 MG PO TABS
3.0000 mg | ORAL_TABLET | Freq: Once | ORAL | Status: AC
  Administered 2017-09-15: 3 mg via ORAL
  Filled 2017-09-15: qty 1

## 2017-09-15 MED ORDER — WARFARIN SODIUM 2 MG PO TABS
2.0000 mg | ORAL_TABLET | Freq: Two times a day (BID) | ORAL | Status: DC
  Administered 2017-09-16: 2 mg via ORAL
  Filled 2017-09-15 (×2): qty 1

## 2017-09-15 MED ORDER — DOCUSATE SODIUM 100 MG PO CAPS
200.0000 mg | ORAL_CAPSULE | Freq: Once | ORAL | Status: AC
  Administered 2017-09-15: 200 mg via ORAL
  Filled 2017-09-15: qty 2

## 2017-09-15 MED ORDER — WARFARIN SODIUM 2 MG PO TABS
2.0000 mg | ORAL_TABLET | Freq: Once | ORAL | Status: AC
  Administered 2017-09-15: 2 mg via ORAL
  Filled 2017-09-15: qty 1

## 2017-09-15 MED ORDER — DOCUSATE SODIUM 100 MG PO CAPS
100.0000 mg | ORAL_CAPSULE | Freq: Two times a day (BID) | ORAL | Status: DC
  Administered 2017-09-16: 100 mg via ORAL
  Filled 2017-09-15: qty 1

## 2017-09-15 NOTE — Progress Notes (Signed)
Physical Therapy Treatment Patient Details Name: ADELL KOVAL MRN: [id number] DOB: Oct 11, 1932 Today's Date: 09/15/2017    History of Present Illness Pt is a 84 y.o. female with a known history of bradycardia status post pacemaker, congestive heart failure, hypertension, paroxysmal atrial fibrillation who was just hospitalized recently with a UTI and a fall.  Patient recently was in her kitchen when she passed out.  She does not recall what happened.  She states that she had been feeling dizzy and not feeling good.  Patient also complained of sharp chest pain with deep breathing.  Assessment includes: Syncope, musculoskeletal pain, A-fib, compensated diastolic and heart failure.    PT Comments    Pt responded well to therapy. Pt reported to PT that she had another syncope episode this AM, roughly 15 minutes before PT arrival, while using bathroom with NT. Pt says when she stood from toilet and everything went "dark" and she needed to be "bear hugged" by NT. Pt was able to return to bed, and has been resting supine since episode. Pt was willing to participate in therapy. Pt was mod ind/CGA for all mobility activities. PT took pt Orthostatics: Supine- 104/62 HR 64  Sit- 109/73 HR 79  Stand- 114/64  HR 77  Stand 3 min- 126/76 HR 67. Pt denied any symptoms with each position and carried on conversation with PT w/o difficulty. Pt's symptoms are not most consistent with vestibular origin, however pt does report she typically feels dizzy with linear acceleration of head when coming from supine to sit. PT was unable to invoke symptoms in pt with head rolls or supine to long sitting maneuver. Pt is high functioning form a mobility and strength perspective, assuming medical management of syncope issues pt continues to be safe for return home. Recommend transition to [place] upon discharge from acute hospitalization.   Follow Up Recommendations  Home health PT     Equipment Recommendations  Rolling walker with  5" wheels;Other (comment)    Recommendations for Other Services       Precautions / Restrictions Precautions Precautions: Fall Restrictions Weight Bearing Restrictions: No    Mobility  Bed Mobility Overal bed mobility: Modified Independent             General bed mobility comments: Extra time and effort required but no physical assistance needed  Transfers   Equipment used: Straight cane Transfers: Sit to/from Stand Sit to Stand: Supervision         General transfer comment: Needs extra time, but is stable and asserts she "can do it herself"  Ambulation/Gait Ambulation/Gait assistance: Min guard Gait Distance (Feet): 30 Feet Assistive device: Straight cane Gait Pattern/deviations: Step-through pattern;Decreased step length - right;Decreased step length - left Gait velocity: Decreased   General Gait Details: Occasional min instability during amb but pt was able to self-correct   Stairs             Wheelchair Mobility    Modified Rankin (Stroke Patients Only)       Balance Overall balance assessment: Mild deficits observed, not formally tested Sitting-balance support: No upper extremity supported;Feet supported Sitting balance-Leahy Scale: Good       Standing balance-Leahy Scale: Fair Standing balance comment: Pt needs SPC to tolerate any challenge.                             Cognition Arousal/Alertness: Awake/alert Behavior During Therapy: WFL for tasks assessed/performed Overall Cognitive Status: Within Functional  Limits for tasks assessed                                        Exercises Other Exercises Other Exercises: Bed mobility: supine to sit mod ind; Transfers: sit to stand w/SPC supervision; Ambulation: 75 ft w/ SPC CGA.  Other Exercises: Pt ambulated to sink and brushed teeth, SPC removed from pt hand to test and work on balance with ADL's.     General Comments        Pertinent Vitals/Pain Pain  Assessment: No/denies pain    Home Living                      Prior Function            PT Goals (current goals can now be found in the care plan section) Progress towards PT goals: Progressing toward goals    Frequency    Min 2X/week      PT Plan Current plan remains appropriate    Co-evaluation              AM-PAC PT "6 Clicks" Daily Activity  Outcome Measure  Difficulty turning over in bed (including adjusting bedclothes, sheets and blankets)?: A Little Difficulty moving from lying on back to sitting on the side of the bed? : A Little Difficulty sitting down on and standing up from a chair with arms (e.g., wheelchair, bedside commode, etc,.)?: A Little Help needed moving to and from a bed to chair (including a wheelchair)?: A Little Help needed walking in hospital room?: A Little Help needed climbing 3-5 steps with a railing? : A Little 6 Click Score: 18    End of Session Equipment Utilized During Treatment: Gait belt Activity Tolerance: Patient tolerated treatment well Patient left: in chair;with call bell/phone within reach;with chair alarm set Nurse Communication: Mobility status;Other (comment)(Orhtostatic vitals were normal) PT Visit Diagnosis: Unsteadiness on feet (R26.81);Muscle weakness (generalized) (M62.81);Difficulty in walking, not elsewhere classified (R26.2)     Time: 9150-5697 PT Time Calculation (min) (ACUTE ONLY): 33 min  Charges:                        Hortencia Conradi, SPT 09/15/17,1:35 PM

## 2017-09-15 NOTE — Progress Notes (Signed)
[facility] [place] NAME: Tamecca Artiga    MR#:  [id number]  DATE OF BIRTH:  07/03/1932  SUBJECTIVE:  CHIEF COMPLAINT:   Chief Complaint  Patient presents with  . Loss of Consciousness  . Chest Pain  Patient seen and evaluated today No complaints of any dizziness Orthostatic blood pressure was checked and her numbers look fine Pacemaker reprogrammed by electrophysiologist today Generalized weakness No chest pain  REVIEW OF SYSTEMS:    ROS  CONSTITUTIONAL: No documented fever. Has fatigue, weakness. No weight gain, no weight loss.  EYES: No blurry or double vision.  ENT: No tinnitus. No postnasal drip. No redness of the oropharynx.  RESPIRATORY: No cough, no wheeze, no hemoptysis. No dyspnea.  CARDIOVASCULAR: Has chest soreness. No orthopnea. No palpitations. No syncope.  GASTROINTESTINAL: No nausea, no vomiting or diarrhea. No abdominal pain. No melena or hematochezia.  GENITOURINARY: No dysuria or hematuria.  ENDOCRINE: No polyuria or nocturia. No heat or cold intolerance.  HEMATOLOGY: No anemia. No bruising. No bleeding.  INTEGUMENTARY: No rashes. No lesions.  MUSCULOSKELETAL: No arthritis. No swelling. No gout.  NEUROLOGIC: No numbness, tingling, or ataxia. No seizure-type activity.  Has no dizziness. PSYCHIATRIC: No anxiety. No insomnia. No ADD.   DRUG ALLERGIES:   Allergies  Allergen Reactions  . Latex Other (See Comments)    blisters  . Other Other (See Comments)    Blisters/ paper tape is Market researcher paper tape is Market researcher paper tape is Ok   . Tape Other (See Comments)    Blisters/ paper tape is Ok  . Xarelto [Rivaroxaban] Other (See Comments)    Gums bleeding and too much bruising  . Nickel Rash and Other (See Comments)    VITALS:  Blood pressure 120/72, pulse 69, temperature (!) 97.5 F (36.4 C), temperature source Oral, resp. rate 16, height 5\' 2"  (1.575 m), weight 59.4 kg, SpO2 98 %.  PHYSICAL  EXAMINATION:   Physical Exam  GENERAL:  84 y.o.-year-old patient lying in the bed with no acute distress.  EYES: Pupils equal, round, reactive to light and accommodation. No scleral icterus. Extraocular muscles intact.  HEENT: Head atraumatic, normocephalic. Oropharynx and nasopharynx clear.  NECK:  Supple, no jugular venous distention. No thyroid enlargement, no tenderness.  LUNGS: Normal breath sounds bilaterally, no wheezing, rales, rhonchi. No use of accessory muscles of respiration.  Chest wall tender on palpation CARDIOVASCULAR: S1, S2 irregular. No murmurs, rubs, or gallops.  ABDOMEN: Soft, nontender, nondistended. Bowel sounds present. No organomegaly or mass.  EXTREMITIES: No cyanosis, clubbing or edema b/l.    NEUROLOGIC: Cranial nerves II through XII are intact. No focal Motor or sensory deficits b/l.   PSYCHIATRIC: The patient is alert and oriented x 3.  SKIN: No obvious rash, lesion, or ulcer.   LABORATORY PANEL:   CBC Recent Labs  Lab 09/13/17 0518  WBC 5.6  HGB 9.4*  HCT 27.5*  PLT 139*   ------------------------------------------------------------------------------------------------------------------ Chemistries  Recent Labs  Lab 09/12/17 1401  09/15/17 0256  NA 133*   < > 134*  K 4.7   < > 4.1  CL 103   < > 100  CO2 21*   < > 25  GLUCOSE 138*   < > 122*  BUN 20   < > 15  CREATININE 0.84   < > 0.72  CALCIUM 8.6*   < > 8.5*  AST 64*  --   --   ALT 50*  --   --  ALKPHOS 69  --   --   BILITOT 0.6  --   --    < > = values in this interval not displayed.   ------------------------------------------------------------------------------------------------------------------  Cardiac Enzymes Recent Labs  Lab 09/13/17 0022  TROPONINI <0.03   ------------------------------------------------------------------------------------------------------------------  RADIOLOGY:  No results found.   ASSESSMENT AND PLAN:   84 year old female patient with history  of pacemaker for bradycardia, congestive heart failure, hypertension, paroxysmal atrial fibrillation, sick sinus syndrome, vaginal cancer currently under hospitalist service for syncope with  -Syncope/Dizziness Dizziness has improved Cardiac work-up negative so far EP evaluated patient today and reprogrammed pacemaker Will monitor patient on telemetry for 1 more day  echocardiogram reviewed Prostatic's were checked and were normal  -Musculoskeletal chest pain Probably secondary to fall Pain management  -Paroxysmal atrial fibrillation Continue Coumadin for anticoagulation Currently on increased dose of nadolol and flecainide for rate control as per cardiology  -Compensated diastolic heart failure Medical mgmt  -Physical therapy evaluation for gait and balance training  -will hold discharge today in view of dizziness and drop in blood pressure  All the records are reviewed and case discussed with Care Management/Social Worker. Management plans discussed with the patient, family and they are in agreement.  CODE STATUS: DNR  DVT Prophylaxis: SCDs  TOTAL TIME TAKING CARE OF THIS PATIENT: 33 minutes.   POSSIBLE D/C IN 1  DAYS, DEPENDING ON CLINICAL CONDITION.  [name] M.D on 09/15/2017 at 1:15 PM  Between 7am to 6pm - Pager - [phone]  After 6pm go to [email or web address] - password [email or web address] [facility]  Office  [phone]  CC: Primary care physician; Kirk Ruths, MD  Note: This dictation was prepared with Dragon dictation along with smaller phrase technology. Any transcriptional errors that result from this process are unintentional.

## 2017-09-15 NOTE — Consult Note (Signed)
Full note to follow   Assessment and plan: Atrial fibrillation-paroxysmal  Pacemaker-MedtronicOrthostatic lightheadedness    Weakness/fatigue  Syncope    The patient had syncope probably I suspect it is orthostatic.  These have not yet been recorded in the hospital; we will do them today.  As outlined earlier, bedrest is terrible.  It is really really helpful for this lady to be out of bed as much of the day as possible.  She has spent most of her 2 days in hospital in bed. \ She had atrial fibrillation for 4 or 5 days prior to hospitalization.  This may have augmented weakness but there has been a gradual and progressive decline over recent months independent of her heart rate issues.  This may be benefited by physical therapy-aggressive as well as attention to her diet.  Unfortunately physical therapy in the hospital has been scant having been walked once.  Her pacemaker also demonstrates excessive heart rates with exertion.  I wonder if this is related to using her cane in her left arm and causing the device to sense more motion and is actually happening.  We will reprogram her pacemaker.  Up titration of her flecainide may help mitigate recurrences of her atrial tachycardia/fibrillation.  In the event that is unsuccessful, we can track this remotely, would use amiodarone.

## 2017-09-15 NOTE — Clinical Social Work Note (Signed)
CSW received phone call from [facility], who said insurance is requesting an OT note for patient.  CSW requested OT order from physician, CSW Spoke to PT, and they will ask OT to see patient.  CSW updated patient and SNF, CSW to continue to follow patient's progress throughout discharge planning.  Jones Broom. [place], MSW, [place]  09/15/2017 3:14 PM

## 2017-09-15 NOTE — Evaluation (Signed)
Occupational Therapy Evaluation Patient Details Name: Alejandra Wiley MRN: [id number] DOB: [birth year] Today's Date: 09/15/2017    History of Present Illness Pt is a 84 y.o. female with a known history of bradycardia status post pacemaker, congestive heart failure, hypertension, paroxysmal atrial fibrillation who was just hospitalized recently with a UTI and a fall.  Patient recently was in her kitchen when she passed out.  She does not recall what happened.  She states that she had been feeling dizzy and not feeling good.  Patient also complained of sharp chest pain with deep breathing.  Assessment includes: Syncope, musculoskeletal pain, A-fib, compensated diastolic and heart failure.   Clinical Impression   Pt seen for OT evaluation this date. Prior to hospital admission, pt was modified independent with ADL and mobility, PRN SPC use, and at least 4 falls in past 12 months 2/2 syncope per pt report.  Pt lives in an [place] alone (spouse is in [place]) at [facility]. She enjoys social activities, Sodoku puzzles, and daily visits with her spouse.  Currently pt demonstrates impairments in activity tolerance, pain, and cardiopulmonary status requiring supervision assist for LB ADL during transitional movements and functional mobility. Pt educated in falls prevention strategies and energy conservation strategies to support return to PLOF.  Pt would benefit from skilled OT to address noted impairments and functional limitations (see below for any additional details) in order to maximize safety and independence while minimizing falls risk and caregiver burden.  Upon hospital discharge, recommend pt discharge to [facility] with [facility] services to facilitate safe and progressive return to prior routines/roles.     Follow Up Recommendations  Home health OT    Equipment Recommendations  None recommended by OT    Recommendations for Other Services       Precautions / Restrictions Precautions Precautions:  Fall Restrictions Weight Bearing Restrictions: No      Mobility Bed Mobility Overal bed mobility: Modified Independent             General bed mobility comments: +time/effort  Transfers Overall transfer level: Needs assistance Equipment used: Straight cane Transfers: Sit to/from Stand Sit to Stand: Supervision         General transfer comment: Needs extra time, but is stable and asserts she "can do it herself"    Balance Overall balance assessment: Mild deficits observed, not formally tested Sitting-balance support: No upper extremity supported;Feet supported Sitting balance-Leahy Scale: Good       Standing balance-Leahy Scale: Fair Standing balance comment: Pt needs SPC to tolerate any challenge.                            ADL either performed or assessed with clinical judgement   ADL Overall ADL's : Needs assistance/impaired                                       General ADL Comments: Pt generally at supervision level for safety and falls prevention     Vision Baseline Vision/History: No visual deficits Patient Visual Report: No change from baseline Vision Assessment?: No apparent visual deficits     Perception     Praxis      Pertinent Vitals/Pain Pain Assessment: 0-10 Pain Score: 4  Pain Location: when she breathes in deeply, the L lung feels like it "catches" Pain Descriptors / Indicators: Sharp Pain Intervention(s): Limited  activity within patient's tolerance;Monitored during session     Hand Dominance Right   Extremity/Trunk Assessment Upper Extremity Assessment Upper Extremity Assessment: Generalized weakness(difficulty at times with self feeding, grooming, and writing 2/2 tremors that come and go in her hands (R>L), no tremors noted during eval)   Lower Extremity Assessment Lower Extremity Assessment: Generalized weakness   Cervical / Trunk Assessment Cervical / Trunk Assessment: Normal   Communication  Communication Communication: No difficulties   Cognition Arousal/Alertness: Awake/alert Behavior During Therapy: WFL for tasks assessed/performed Overall Cognitive Status: Within Functional Limits for tasks assessed                                     General Comments       Exercises Other Exercises Other Exercises: Pt educated in energy conservation and falls prevention strategies to support increased activity tolerance and safety while performing ADL and functional mobility.   Shoulder Instructions      Home Living Family/patient expects to be discharged to:: Other (Comment)([place]) Living Arrangements: Alone Available Help at Discharge: Family;Available PRN/intermittently Type of Home: Independent living facility       Home Layout: One level     Bathroom Shower/Tub: Occupational psychologist: Handicapped height     Home Equipment: [place] - single point;Other (comment);Wheelchair - manual   Additional Comments: husband in long term care at [facility], pt in [place] at [facility]      Prior Functioning/Environment Level of Independence: Independent        Comments: Pt ind with amb mostly without an AD but occasionally with a SPC, current fall from LOC and one other recent fall from severe dizziness, approx 4 falls in past 12 months, Ind with ADLs although endorses difficulty at times with self feeding, grooming, and writing 2/2 tremors that come and go in her hands (R>L), local driving only        OT Problem List: Decreased strength;Decreased knowledge of use of DME or AE;Cardiopulmonary status limiting activity;Decreased activity tolerance;Pain      OT Treatment/Interventions:      OT Goals(Current goals can be found in the care plan section) Acute Rehab OT Goals Patient Stated Goal: To get stronger and go home so she can see her spouse in LTC at [facility] OT Goal Formulation: With patient Time For Goal Achievement:  09/29/17 Potential to Achieve Goals: Good ADL Goals Pt Will Perform Eating: with modified independence;with adaptive utensils;sitting(adaptive utensils to minimize tremor) Additional ADL Goal #1: Pt will verbalize plan to implement at least 1 learned ECS to maximize safety and return to PLOF. Additional ADL Goal #2: Pt will verbalize plan to implement at least 1 learned falls prevention strategy to minimize falls risk at home.  OT Frequency:     Barriers to D/C:            Co-evaluation              AM-PAC PT "6 Clicks" Daily Activity     Outcome Measure Help from another person eating meals?: None Help from another person taking care of personal grooming?: None Help from another person toileting, which includes using toliet, bedpan, or urinal?: A Little Help from another person bathing (including washing, rinsing, drying)?: A Little Help from another person to put on and taking off regular upper body clothing?: None Help from another person to put on and taking off regular  lower body clothing?: A Little 6 Click Score: 21   End of Session    Activity Tolerance: Patient tolerated treatment well Patient left: in bed;with call bell/phone within reach;with bed alarm set  OT Visit Diagnosis: Other abnormalities of gait and mobility (R26.89);Muscle weakness (generalized) (M62.81)                Time: 1451-1510 OT Time Calculation (min): 19 min Charges:  OT General Charges $OT Visit: 1 Visit OT Evaluation $OT Eval Low Complexity: 1 Low OT Treatments $Self Care/Home Management : 8-22 mins  Jeni Salles, MPH, MS, OTR/L ascom [phone] 09/15/17, 3:35 PM

## 2017-09-15 NOTE — Clinical Social Work Note (Signed)
CSW spoke with [facility] to update them that patient was not medically ready for discharge today.  CSW to continue to follow patient's progress throughout discharge planning.  CSW also labelled patient's oxygen tank which is from [facility], which will be going with her once she is medically ready for discharge.  Jones Broom. [place], MSW, [place]

## 2017-09-15 NOTE — Progress Notes (Addendum)
Alejandra Wiley for Warfarin Indication: atrial fibrillation  Allergies  Allergen Reactions  . Latex Other (See Comments)    blisters  . Other Other (See Comments)    Blisters/ paper tape is Market researcher paper tape is Market researcher paper tape is Ok   . Tape Other (See Comments)    Blisters/ paper tape is Ok  . Xarelto [Rivaroxaban] Other (See Comments)    Gums bleeding and too much bruising  . Nickel Rash and Other (See Comments)    Patient Measurements: Height: 5\' 2"  (157.5 cm) Weight: 130 lb 14.4 oz (59.4 kg) IBW/kg (Calculated) : 50.1 Heparin Dosing Weight:    Vital Signs: Temp: 97.5 F (36.4 C) (08/29 0354) Temp Source: Oral (08/29 0354) BP: 120/72 (08/29 0354) Pulse Rate: 69 (08/29 0354)  Labs: Recent Labs    09/12/17 1401 09/12/17 1951 09/13/17 0022 09/13/17 0518 09/13/17 0959 09/14/17 0636 09/15/17 0256  HGB 10.3*  --   --  9.4*  --   --   --   HCT 30.4*  --   --  27.5*  --   --   --   PLT 176  --   --  139*  --   --   --   LABPROT 23.8*  --   --   --  19.5* 22.2* 21.7*  INR 2.15  --   --   --  1.66 1.96 1.91  CREATININE 0.84  --   --  0.67  --   --  0.72  CKTOTAL 42  --   --   --   --   --   --   TROPONINI <0.03 <0.03 <0.03  --   --   --   --     Estimated Creatinine Clearance: 42.9 mL/min (by C-G formula based on SCr of 0.72 mg/dL).   Medical History: Past Medical History:  Diagnosis Date  . Bradycardia   . Cancer (Alejandra Wiley)    vaginal intraepithelial neoplasia  . CHF (congestive heart failure) (Alejandra Wiley)   . Hypertension   . Pacemaker 12/06/2003   Medtronic Impulse [id number]  . Pacemaker 2015  . PAF (paroxysmal atrial fibrillation) (HCC)    a. on Coumadin and flecainide; b. CHADS2VASc 3 (HTN, age x 2, female)  . Sick sinus syndrome (Alejandra Wiley)   . Sinoatrial node dysfunction (HCC)   . Vaginal intraepithelial neoplasia     Medications:  Scheduled:  . flecainide  100 mg Oral BID  . multivitamin with minerals  1 tablet  Oral Daily  . nadolol  20 mg Oral BID  . sodium chloride flush  3 mL Intravenous Q12H  . warfarin  2 mg Oral BID  . Warfarin - Pharmacist Dosing Inpatient   Does not apply q1800    Assessment: 84 yo F on Warfarin 2 mg po BID at home for Afib. Hgb 9.4   Plt 139  8/26  INR  2.15  Warfarin 2 mg at bedtime (Med Rec states pt took Warfarin 8/26 PTA so assuming she took am Warfarin 2 mg also) 8/27  INR  1.66   2 mg/3 mg 8/28  INR  1.96   2 mg/2 mg 8/29  INR  1.91     Goal of Therapy:  INR 2-3 Monitor platelets by anticoagulation protocol: Yes   Plan:  INR almost therapeutic. Will adjust dose to 3 mg x 1 this am and 2 mp qpm x1  And continue tomorrow with home regimen of 2  mg BID.  INR in the AM  Alejandra Wiley PharmD Clinical Pharmacist 09/15/2017

## 2017-09-15 NOTE — Progress Notes (Signed)
Patient complains of feeling constipated.  This RN asked if she wanted me to get a stool softener.  Pt thinks that would be a good idea.  On-call MD paged.

## 2017-09-16 ENCOUNTER — Telehealth: Payer: Self-pay | Admitting: Internal Medicine

## 2017-09-16 DIAGNOSIS — R630 Anorexia: Secondary | ICD-10-CM | POA: Diagnosis not present

## 2017-09-16 DIAGNOSIS — M810 Age-related osteoporosis without current pathological fracture: Secondary | ICD-10-CM | POA: Diagnosis not present

## 2017-09-16 DIAGNOSIS — I480 Paroxysmal atrial fibrillation: Secondary | ICD-10-CM | POA: Diagnosis not present

## 2017-09-16 DIAGNOSIS — Z950 Presence of cardiac pacemaker: Secondary | ICD-10-CM | POA: Diagnosis not present

## 2017-09-16 DIAGNOSIS — R4182 Altered mental status, unspecified: Secondary | ICD-10-CM

## 2017-09-16 DIAGNOSIS — I959 Hypotension, unspecified: Secondary | ICD-10-CM | POA: Diagnosis not present

## 2017-09-16 DIAGNOSIS — I10 Essential (primary) hypertension: Secondary | ICD-10-CM | POA: Diagnosis not present

## 2017-09-16 DIAGNOSIS — I495 Sick sinus syndrome: Secondary | ICD-10-CM | POA: Diagnosis not present

## 2017-09-16 DIAGNOSIS — R2681 Unsteadiness on feet: Secondary | ICD-10-CM | POA: Diagnosis not present

## 2017-09-16 DIAGNOSIS — Z471 Aftercare following joint replacement surgery: Secondary | ICD-10-CM | POA: Diagnosis not present

## 2017-09-16 DIAGNOSIS — M159 Polyosteoarthritis, unspecified: Secondary | ICD-10-CM | POA: Diagnosis not present

## 2017-09-16 DIAGNOSIS — M6281 Muscle weakness (generalized): Secondary | ICD-10-CM | POA: Diagnosis not present

## 2017-09-16 DIAGNOSIS — R079 Chest pain, unspecified: Secondary | ICD-10-CM | POA: Diagnosis not present

## 2017-09-16 DIAGNOSIS — I110 Hypertensive heart disease with heart failure: Secondary | ICD-10-CM | POA: Diagnosis not present

## 2017-09-16 DIAGNOSIS — R55 Syncope and collapse: Secondary | ICD-10-CM | POA: Diagnosis not present

## 2017-09-16 DIAGNOSIS — R531 Weakness: Secondary | ICD-10-CM | POA: Diagnosis not present

## 2017-09-16 DIAGNOSIS — M1990 Unspecified osteoarthritis, unspecified site: Secondary | ICD-10-CM | POA: Diagnosis not present

## 2017-09-16 LAB — CBC
HEMATOCRIT: 29.3 % — AB (ref 35.0–47.0)
Hemoglobin: 10 g/dL — ABNORMAL LOW (ref 12.0–16.0)
MCH: 30.8 pg (ref 26.0–34.0)
MCHC: 34 g/dL (ref 32.0–36.0)
MCV: 90.6 fL (ref 80.0–100.0)
Platelets: 173 10*3/uL (ref 150–440)
RBC: 3.23 MIL/uL — ABNORMAL LOW (ref 3.80–5.20)
RDW: 15.4 % — AB (ref 11.5–14.5)
WBC: 7.9 10*3/uL (ref 3.6–11.0)

## 2017-09-16 LAB — PROTIME-INR
INR: 2.28
Prothrombin Time: 24.9 seconds — ABNORMAL HIGH (ref 11.4–15.2)

## 2017-09-16 NOTE — Discharge Summary (Signed)
[facility] [place] NAME: Alejandra Wiley    MR#:  [id number]  DATE OF BIRTH:  1932/01/24  DATE OF ADMISSION:  09/12/2017 ADMITTING PHYSICIAN: Dustin Flock, MD  DATE OF DISCHARGE: 09/16/2017  PRIMARY CARE PHYSICIAN: Kirk Ruths, MD   ADMISSION DIAGNOSIS:  Hypotension, unspecified hypotension type [I95.9] Altered mental status, unspecified altered mental status type [R41.82] Syncope Chest pain Chronic diastolic heart failure Osteopenia DISCHARGE DIAGNOSIS:  Active Problems:   Syncope and collapse Musculoskeletal chest pain Hypertension Paroxysmal atrial fibrillation Osteopenia Chronic diastolic heart failure compensated  SECONDARY DIAGNOSIS:   Past Medical History:  Diagnosis Date  . Bradycardia   . 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 CHF (congestive heart failure) ([place])   . Hypertension   . Pacemaker 12/06/2003   Medtronic Impulse DTDR01  . Pacemaker 2015  . PAF (paroxysmal atrial fibrillation) (HCC)    a. on Coumadin and flecainide; b. CHADS2VASc 3 (HTN, age x 2, female)  . Sick sinus syndrome ([place])   . Sinoatrial node dysfunction (HCC)   . Vaginal intraepithelial neoplasia      ADMITTING HISTORY Alejandra Wiley  is a 84 y.o. female with a known history of bradycardia status post pacemaker, congestive heart failure,  hypertension, paroxysmal atrial fibrillation who was just hospitalized recently with a UTI and a fall.  Patient today was in her kitchen when she passed out.  She does not recall what happened.  She states that since yesterday she has been feeling dizzy and not feeling good.  Patient also complains of sharp chest pain with deep breathing.  Which started out today as well.  HOSPITAL COURSE:  Patient was admitted to telemetry and seen by [facility] health cardiology.  Amlodipine was stopped.  Serial troponins were completely negative.  Patient was worked up with CT cervical spine, CT head and  x-ray of the pelvis for fall.  No evidence of any fractures or any acute bleed noted.  She was also worked up with CT angiogram of the chest which showed no pulmonary embolism, rib fractures or any pneumonia.  CTA chest showed small subcentimeter retroperitoneal lymph nodes of uncertain significance. Her chest pain was musculoskeletal in nature and was reproducible and secondary to fall and blunt injury.  The patient during the hospitalization received physical therapy.  Her nadolol was increased to 20 mg orally twice daily and flecainide was increased 200 mg twice daily by cardiology.She had an episode of hypotension but orthostatics were normal when checked.  EP evaluation was done in the hospital and pace maker reprogrammed.  Patient has gait instability and will need physical therapy for endurance training and balance exercises.  She will be discharged to [facility] facility with rehab services.  Disposition plan was also discussed with patient's daughter.  CONSULTS OBTAINED:  Treatment Team:  Wellington Hampshire, MD Larey Dresser, MD End, Harrell Gave, MD  DRUG ALLERGIES:   Allergies  Allergen Reactions  . Latex Other (See Comments)    blisters  . Other Other (See Comments)    Blisters/ paper tape is Market researcher paper tape is Market researcher paper tape is Ok   . Tape Other (See Comments)    Blisters/ paper tape is Ok  . Xarelto [Rivaroxaban] Other (See Comments)    Gums bleeding and too much bruising  . Nickel Rash and Other (See Comments)    DISCHARGE MEDICATIONS:   Allergies as of 09/16/2017      Reactions  Latex Other (See Comments)   blisters   Other Other (See Comments)   Blisters/ paper tape is Market researcher paper tape is Market researcher paper tape is Ok   Tape Other (See Comments)   Blisters/ paper tape is Ok   Xarelto [rivaroxaban] Other (See Comments)   Gums bleeding and too much bruising   Nickel Rash, Other (See Comments)      Medication List    STOP taking  these medications   amLODipine 5 MG tablet Commonly known as:  NORVASC     TAKE these medications   acetaminophen 500 MG tablet Commonly known as:  TYLENOL Take 500 mg by mouth every 8 (eight) hours as needed for mild pain or headache.   diphenhydrAMINE 25 mg capsule Commonly known as:  BENADRYL Take 25 mg by mouth at bedtime as needed for sleep. Takes along with Tylenol   flecainide 100 MG tablet Commonly known as:  TAMBOCOR Take 1 tablet (100 mg total) by mouth 2 (two) times daily. What changed:    medication strength  See the new instructions.   fluticasone 50 MCG/ACT nasal spray Commonly known as:  FLONASE Place 2 sprays into both nostrils daily. Notes to patient:  NONE GIVEN TODAY   ibandronate 150 MG tablet Commonly known as:  BONIVA Take 150 mg by mouth every 30 (thirty) days. Take in the morning with a full glass of water, on an empty stomach, and do not take anything else by mouth or lie down for the next 30 min. Notes to patient:  NONE GIVEN TODAY   MULTI VITAMIN DAILY PO Take 1 tablet by mouth daily. Centrum silver   nadolol 20 MG tablet Commonly known as:  CORGARD Take 1 tablet (20 mg total) by mouth 2 (two) times daily. What changed:    how much to take  when to take this   Slater 1 spray into the nose daily. NeilMed Sinus Rinse   warfarin 1 MG tablet Commonly known as:  COUMADIN Take 2 mg by mouth 2 (two) times daily.     Oxygen via Nasal canula at 2liter  Today  Patient seen and evaluated today No chest pain No shortness of breath No headache, blurry vision Tolerating diet well VITAL SIGNS:  Blood pressure 129/76, pulse 88, temperature 98.1 F (36.7 C), temperature source Oral, resp. rate 18, height 5\' 2"  (1.575 m), weight 59.4 kg, SpO2 94 %.  I/O:    Intake/Output Summary (Last 24 hours) at 09/16/2017 1051 Last data filed at 09/16/2017 1045 Gross per 24 hour  Intake 240 ml  Output 101 ml  Net 139 ml     PHYSICAL EXAMINATION:  Physical Exam  GENERAL:  84 y.o.-year-old patient lying in the bed with no acute distress.  LUNGS: Normal breath sounds bilaterally, no wheezing, rales,rhonchi or crepitation. No use of accessory muscles of respiration.  CARDIOVASCULAR: S1, S2 normal. No murmurs, rubs, or gallops.  ABDOMEN: Soft, non-tender, non-distended. Bowel sounds present. No organomegaly or mass.  NEUROLOGIC: Moves all 4 extremities. PSYCHIATRIC: The patient is alert and oriented x 3.  SKIN: No obvious rash, lesion, or ulcer.   DATA REVIEW:   CBC Recent Labs  Lab 09/16/17 0634  WBC 7.9  HGB 10.0*  HCT 29.3*  PLT 173    Chemistries  Recent Labs  Lab 09/12/17 1401  09/15/17 0256  NA 133*   < > 134*  K 4.7   < > 4.1  CL 103   < >  100  CO2 21*   < > 25  GLUCOSE 138*   < > 122*  BUN 20   < > 15  CREATININE 0.84   < > 0.72  CALCIUM 8.6*   < > 8.5*  AST 64*  --   --   ALT 50*  --   --   ALKPHOS 69  --   --   BILITOT 0.6  --   --    < > = values in this interval not displayed.    Cardiac Enzymes Recent Labs  Lab 09/13/17 0022  TROPONINI <0.03    Microbiology Results  Results for orders placed or performed during the hospital encounter of 08/26/17  Urine culture     Status: None   Collection Time: 08/26/17  5:40 AM  Result Value Ref Range Status   Specimen Description   Final    URINE, RANDOM Performed at [facility], [address] [place], [place] [address]    Special Requests   Final    NONE Performed at [facility], [address] [place], [place] [address]    Culture   Final    NO GROWTH Performed at [facility], [place] [address], [place], [place] [address]    Report Status 08/27/2017 FINAL  Final  MRSA PCR Screening     Status: None   Collection Time: 08/26/17  1:21 PM  Result Value Ref Range Status   MRSA by PCR NEGATIVE NEGATIVE Final    Comment:        The GeneXpert MRSA Assay (FDA approved for NASAL  specimens only), is one component of a comprehensive MRSA colonization surveillance program. It is not intended to diagnose MRSA infection nor to guide or monitor treatment for MRSA infections. Performed at [facility], [address] [place], [place] [address]     RADIOLOGY:  No results found.  Follow up with PCP in 1 week.  Management plans discussed with the patient, family and they are in agreement.  CODE STATUS: DNR    Code Status Orders  (From admission, onward)         Start     Ordered   09/12/17 1809  Do not attempt resuscitation (DNR)  Continuous    Question Answer Comment  In the event of cardiac or respiratory ARREST Do not call a "code blue"   In the event of cardiac or respiratory ARREST Do not perform Intubation, CPR, defibrillation or ACLS   In the event of cardiac or respiratory ARREST Use medication by any route, position, wound care, and other measures to relive pain and suffering. May use oxygen, suction and manual treatment of airway obstruction as needed for comfort.      09/12/17 1808        Code Status History    Date Active Date Inactive Code Status Order ID Comments User Context   09/12/2017 1611 09/12/2017 1808 DNR [id number]  Dustin Flock, MD ED   08/26/2017 0521 08/27/2017 1640 Full Code [id number]  Lance Coon, MD Inpatient   06/23/2017 1732 06/24/2017 1701 Full Code [id number]  Gladstone Lighter, MD Inpatient   09/23/2016 1825 09/24/2016 1702 Full Code [id number]  Hessie Knows, MD Inpatient    Advance Directive Documentation     Most Recent Value  Type of Advance Directive  Healthcare Power of Attorney  Pre-existing out of facility DNR order (yellow form or pink MOST form)  Yellow form placed in chart (order not valid for inpatient  use)  "MOST" Form in Place?  -      TOTAL TIME TAKING CARE OF THIS PATIENT ON DAY OF DISCHARGE: more than 35 minutes.   [name] M.D on 09/16/2017 at 10:51 AM  Between 7am to 6pm - Pager -  [phone]  After 6pm go to [email or web address] - password [email or web address] [facility]  Office  [phone]  CC: Primary care physician; Kirk Ruths, MD  Note: This dictation was prepared with Dragon dictation along with smaller phrase technology. Any transcriptional errors that result from this process are unintentional.

## 2017-09-16 NOTE — Progress Notes (Signed)
Report called to [employer] took report/ iv and tele removed/ family to  transport to facility

## 2017-09-16 NOTE — Telephone Encounter (Signed)
New message:      Pt's daughter is calling and would like to speak with Dr. Caryl Comes about further care for her mother and what exactly she should expect from her mom

## 2017-09-16 NOTE — Progress Notes (Signed)
[place] [facility] for Warfarin Indication: atrial fibrillation  Allergies  Allergen Reactions  . Latex Other (See Comments)    blisters  . Other Other (See Comments)    Blisters/ paper tape is Market researcher paper tape is Market researcher paper tape is Ok   . Tape Other (See Comments)    Blisters/ paper tape is Ok  . Xarelto [Rivaroxaban] Other (See Comments)    Gums bleeding and too much bruising  . Nickel Rash and Other (See Comments)    Patient Measurements: Height: 5\' 2"  (157.5 cm) Weight: 130 lb 14.4 oz (59.4 kg) IBW/kg (Calculated) : 50.1 Heparin Dosing Weight:    Vital Signs: Temp: 98.1 F (36.7 C) (08/30 0520) Temp Source: Oral (08/30 0520) BP: 96/64 (08/30 0520) Pulse Rate: 115 (08/30 0520)  Labs: Recent Labs    09/14/17 0636 09/15/17 0256 09/16/17 0634  HGB  --   --  10.0*  HCT  --   --  29.3*  PLT  --   --  173  LABPROT 22.2* 21.7* 24.9*  INR 1.96 1.91 2.28  CREATININE  --  0.72  --     Estimated Creatinine Clearance: 42.9 mL/min (by C-G formula based on SCr of 0.72 mg/dL).   Medical History: Past Medical History:  Diagnosis Date  . Bradycardia   . 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 CHF (congestive heart failure) ([place])   . Hypertension   . Pacemaker 12/06/2003   Medtronic Impulse DTDR01  . Pacemaker 2015  . PAF (paroxysmal atrial fibrillation) (HCC)    a. on Coumadin and flecainide; b. CHADS2VASc 3 (HTN, age x 2, female)  . Sick sinus syndrome ([place])   . Sinoatrial node dysfunction (HCC)   . Vaginal intraepithelial neoplasia     Medications:  Scheduled:  . docusate sodium  100 mg Oral BID  . flecainide  100 mg Oral BID  . multivitamin with minerals  1 tablet Oral Daily  . nadolol  20 mg Oral BID  . sodium chloride flush  3 mL Intravenous Q12H  . warfarin  2 mg Oral BID  . Warfarin - Pharmacist Dosing Inpatient   Does not apply q1800    Assessment: 84 yo F on Warfarin 2 mg po BID at  home for Afib. Hgb 9.4   Plt 139  8/26  INR  2.15  Warfarin 2 mg at bedtime (Med Rec states pt took Warfarin 8/26 PTA so assuming she took am Warfarin 2 mg also) 8/27  INR  1.66   2 mg/3 mg 8/28  INR  1.96   2 mg/2 mg 8/29  INR  1.91   3 mg/ 2 mg 8/30  INR  2.28  Goal of Therapy:  INR 2-3 Monitor platelets by anticoagulation protocol: Yes   Plan:  INR therapeutic. Will continue with home regimen of Warfarin 2 mg BID.  F/u INR in the AM  Chinita Greenland PharmD Clinical Pharmacist 09/16/2017

## 2017-09-16 NOTE — Clinical Social Work Placement (Signed)
   CLINICAL SOCIAL WORK PLACEMENT  NOTE  Date:  09/16/2017  Patient Details  Name: Alejandra Wiley MRN: [id number] Date of Birth: 1933-01-01  Clinical Social Work is seeking post-discharge placement for this patient at the [place] level of care (*CSW will initial, date and re-position this form in  chart as items are completed):  Yes   Patient/family provided with [facility] Work Department's list of facilities offering this level of care within the geographic area requested by the patient (or if unable, by the patient's family).  Yes   Patient/family informed of their freedom to choose among providers that offer the needed level of care, that participate in Medicare, Medicaid or managed care program needed by the patient, have an available bed and are willing to accept the patient.  Yes   Patient/family informed of [facility]'s ownership interest in [facility] and [facility], as well as of the fact that they are under no obligation to receive care at these facilities.  PASRR submitted to EDS on 09/16/17     PASRR number received on       Existing PASRR number confirmed on 09/16/17     FL2 transmitted to all facilities in geographic area requested by pt/family on 09/16/17     FL2 transmitted to all facilities within larger geographic area on       Patient informed that his/her managed care company has contracts with or will negotiate with certain facilities, including the following:        Yes   Patient/family informed of bed offers received.  Patient chooses bed at [facility]     Physician recommends and patient chooses bed at      Patient to be transferred to [facility] on 09/16/17.  Patient to be transferred to facility by Patient's neighbor     Patient family notified on 09/16/17 of transfer.  Name of family member notified:  Patient to notify her [name] and her daughter.     PHYSICIAN Please sign DNR, Please sign  FL2, Please prepare prescriptions     Additional Comment:    _______________________________________________ Ross Ludwig, LCSWA 09/16/2017, 11:57 AM

## 2017-09-16 NOTE — Progress Notes (Signed)
Progress Note  Patient Name: Alejandra Wiley Date of Encounter: 09/16/2017  Primary Cardiologist: Caryl Comes  Subjective   Reports that she feels well as morning Walked with physical therapy Planning on going to rehabilitation Telemetry reviewed showing short runs of paroxysmal atrial fibrillation Blood pressure stable on nadolol  Inpatient Medications    Scheduled Meds: . docusate sodium  100 mg Oral BID  . flecainide  100 mg Oral BID  . multivitamin with minerals  1 tablet Oral Daily  . nadolol  20 mg Oral BID  . sodium chloride flush  3 mL Intravenous Q12H  . warfarin  2 mg Oral BID  . Warfarin - Pharmacist Dosing Inpatient   Does not apply q1800   Continuous Infusions:  PRN Meds: acetaminophen **OR** acetaminophen, diphenhydrAMINE, fluticasone, ondansetron **OR** ondansetron (ZOFRAN) IV, traMADol   Vital Signs    Vitals:   09/15/17 0354 09/15/17 2041 09/16/17 0520 09/16/17 0755  BP: 120/72 115/62 96/64 129/76  Pulse: 69 69 (!) 115 88  Resp: 16 20 18    Temp: (!) 97.5 F (36.4 C) 98.4 F (36.9 C) 98.1 F (36.7 C)   TempSrc: Oral Oral Oral   SpO2: 98% 96% 95% 94%  Weight:      Height:        Intake/Output Summary (Last 24 hours) at 09/16/2017 1301 Last data filed at 09/16/2017 1045 Gross per 24 hour  Intake 240 ml  Output 101 ml  Net 139 ml   Filed Weights   09/12/17 1357 09/12/17 1734 09/13/17 0255  Weight: 56.2 kg 59.4 kg 59.4 kg    Telemetry    Atrial fibrillation/flutter paroxysmal episodes Otherwise maintaining normal sinus rhythm  Personally Reviewed  ECG    n/a - Personally Reviewed  Physical Exam   Constitutional:  oriented to person, place, and time. No distress. frail appearing HENT:  Head: Normocephalic and atraumatic.  Eyes:  no discharge. No scleral icterus.  Neck: Normal range of motion. Neck supple. No JVD present.  Cardiovascular: Normal rate, regular rhythm, normal heart sounds and intact distal pulses. Exam reveals no  gallop and no friction rub. No edema No murmur heard. Pulmonary/Chest: Effort normal and breath sounds normal. No stridor. No respiratory distress.  no wheezes.  no rales.  no tenderness.  Abdominal: Soft.  no distension.  no tenderness.  Musculoskeletal: Normal range of motion.  no  tenderness or deformity.  Neurological:  normal muscle tone. Coordination normal. No atrophy Skin: Skin is warm and dry. No rash noted. not diaphoretic.  Psychiatric:  normal mood and affect. behavior is normal. Thought content normal.    Labs    Chemistry Recent Labs  Lab 09/12/17 1401 09/13/17 0518 09/15/17 0256  NA 133* 137 134*  K 4.7 4.3 4.1  CL 103 108 100  CO2 21* 24 25  GLUCOSE 138* 94 122*  BUN 20 17 15   CREATININE 0.84 0.67 0.72  CALCIUM 8.6* 8.3* 8.5*  PROT 6.8  --   --   ALBUMIN 3.2*  --   --   AST 64*  --   --   ALT 50*  --   --   ALKPHOS 69  --   --   BILITOT 0.6  --   --   GFRNONAA >60 >60 >60  GFRAA >60 >60 >60  ANIONGAP 9 5 9      Hematology Recent Labs  Lab 09/12/17 1401 09/13/17 0518 09/16/17 0634  WBC 6.0 5.6 7.9  RBC 3.32* 3.04* 3.23*  HGB  10.3* 9.4* 10.0*  HCT 30.4* 27.5* 29.3*  MCV 91.7 90.5 90.6  MCH 30.9 30.9 30.8  MCHC 33.7 34.1 34.0  RDW 15.3* 15.0* 15.4*  PLT 176 139* 173    Cardiac Enzymes Recent Labs  Lab 09/12/17 1401 09/12/17 1951 09/13/17 0022  TROPONINI <0.03 <0.03 <0.03   No results for input(s): TROPIPOC in the last 168 hours.   BNPNo results for input(s): BNP, PROBNP in the last 168 hours.   DDimer No results for input(s): DDIMER in the last 168 hours.   Radiology    Dg Chest 1 View  Result Date: 09/12/2017 IMPRESSION: Stable cardiomegaly and mild chronic interstitial lung disease. No acute cardiopulmonary disease. Electronically Signed   By: Claudie Revering M.D.   On: 09/12/2017 14:28   Dg Pelvis 1-2 Views  Result Date: 09/12/2017 IMPRESSION: No fracture or dislocation. Hardware intact. Electronically Signed   By: Claudie Revering  M.D.   On: 09/12/2017 14:30   Ct Head Wo Contrast  Result Date: 09/12/2017 IMPRESSION: No evidence of acute intracranial or cervical spine injury. Electronically Signed   By: Monte Fantasia M.D.   On: 09/12/2017 15:30   Ct Cervical Spine Wo Contrast  Result Date: 09/12/2017 IMPRESSION: No evidence of acute intracranial or cervical spine injury. Electronically Signed   By: Monte Fantasia M.D.   On: 09/12/2017 15:30   Ct Angio Chest/abd/pel For Dissection W And/or Wo Contrast  Result Date: 09/12/2017 IMPRESSION: 1. No acute aortic syndrome. 2.  Aortic Atherosclerosis (ICD10-I70.0). 3. Multiple subcentimeter retroperitoneal lymph nodes, uncertain significance. No pathologically enlarged nodes by CT size criteria. 4. Small pleural effusions with associated atelectasis. Electronically Signed   By: Ulyses Jarred M.D.   On: 09/12/2017 15:39    Cardiac Studies   2-D Echo 06/2017: Study Conclusions  - Left ventricle: The cavity size was normal. Systolic function was   normal. The estimated ejection fraction was in the range of 50%   to 55%. Wall motion was normal; there were no regional wall   motion abnormalities. Features are consistent with a pseudonormal   left ventricular filling pattern, with concomitant abnormal   relaxation and increased filling pressure (grade 2 diastolic   dysfunction). - Mitral valve: There was mild to moderate regurgitation. - Left atrium: The atrium was normal in size. - Right ventricle: Systolic function was normal. - Right atrium: The atrium was mildly dilated. - Pulmonary arteries: Systolic pressure was mildly elevated. PA   peak pressure: 44 mm Hg (S). - Inferior vena cava: The vessel was normal in size. The   respirophasic diameter changes were in the normal range (>= 50%),   consistent with normal central venous pressure.  Patient Profile     84 y.o. female with history of paroxysmal atrial fibrillation on Coumadin, sick sinus syndrome, and  tachybradycardia syndrome status post dual-chamber pacemaker who is being seen today for the evaluation of syncope.  Assessment & Plan     Paroxysmal atrial flutter/fibrillation  flecainide up to 100 mg twice daily, nadolol  20 bid On warfarin Seen by Dr. Caryl Comes yesterday  Anemia Concern for malnutrition Stable hematocrit 29  Failure to thrive Very weak,  Requiring rehabilitation May need higher level of care at [facility] than 50% was spent in counseling and coordination of care with patient Total encounter time 25 minutes or more   Signed: Esmond Plants  M.D., Ph.D. [facility]

## 2017-09-16 NOTE — Progress Notes (Signed)
Pt complains of nausea since yesterday.  One dose of zofran given by this RN.

## 2017-09-16 NOTE — Care Management Important Message (Signed)
Copy of signed IM left with patient in room.  

## 2017-09-16 NOTE — Clinical Social Work Note (Addendum)
Patient to be d/c'ed today to [facility] room 229.  Patient and family agreeable to plans will transport via neighbor's car, RN to call report [phone].  Patient to notify her family and friend to pick her up between 1 and 2pm.  Evette Cristal, MSW, [place]

## 2017-09-16 NOTE — Progress Notes (Signed)
RN called to room by NT/ NT assisting pt to bathroom / pt started to c/o dizziness/ VSS/ assisted back to bed/ Dr. Estanislado Pandy paged to make aware/ per MD pt is OK to discharge home/ will continue to monitor.

## 2017-09-20 DIAGNOSIS — R531 Weakness: Secondary | ICD-10-CM | POA: Diagnosis not present

## 2017-09-20 NOTE — Telephone Encounter (Signed)
Reviewed the patient's follow up plan with Dr. Caryl Comes. She has appointments for: 09/27/17 at 3:45 pm with Dr. Caryl Comes ([place]) 09/28/17 at 1:20 pm with CHF clinic Darylene Price, NP) 10/10/17 at 10:40 am with Dr. Rockey Situ  Per Dr. Caryl Comes, follow up appointments are fine as scheduled, but he would like to offer the patient to be seen in the [facility] office with him on 09/27/17 at 9:00 am, instead of [place] in the afternoon.   Will forward to scheduling to see if they can call and offer the patient an appointment to see Dr. Caryl Comes here in [place] on 09/27/17 at 9:00 am.

## 2017-09-20 NOTE — Telephone Encounter (Signed)
Attempted to call patient  No vm no answer Will try again at a later time

## 2017-09-20 NOTE — Telephone Encounter (Signed)
Spoke with pt's daughter in length this morning regarding her mother's frequent hospital stays along with some decline in pt's quality of life. She believes her mother's issues have been overlooked and "things went missed" throughout her last few hospital stays. Pt's daughter states she is also a Therapist, sports.  Pt's daughter and I discussed decline in the elderly population after prolonged hospitalizations. We discussed the need for ongoing physical therapy, exercise, and rest to help recover from hospital stay deconditioning. We discussed the increase in deconditioning for each hospital stay and how it may take several months for her mother to "feel her normal self."   Pt's daughter and I also discussed Dr Olin Pia recent notes and recommendations during her last hospital visit. Pt's daughter understands that pt was in Afib before her last visit and Dr Caryl Comes believes this may be contributing to her fatigue. Dr Caryl Comes also increased dosage of Flecainide at this time. We also discussed the recent PPM changes per Dr Olin Pia note that accounted for usage of a cane in pt's left arm.   Pt's daughter stated she felt much better after reviewing her last hospital stay and follow up plans.   Medications were reviewed at length as well as recommended follow up. Pt has appointment set with Dr Caryl Comes in October, however a follow up visit with an APP has not been scheduled as of yet. I will forward this encounter to Dr Olin Pia RN in [place] for further follow up.

## 2017-09-22 DIAGNOSIS — I495 Sick sinus syndrome: Secondary | ICD-10-CM | POA: Diagnosis not present

## 2017-09-22 DIAGNOSIS — R55 Syncope and collapse: Secondary | ICD-10-CM | POA: Diagnosis not present

## 2017-09-22 DIAGNOSIS — M810 Age-related osteoporosis without current pathological fracture: Secondary | ICD-10-CM | POA: Diagnosis not present

## 2017-09-22 DIAGNOSIS — R079 Chest pain, unspecified: Secondary | ICD-10-CM | POA: Diagnosis not present

## 2017-09-22 DIAGNOSIS — M159 Polyosteoarthritis, unspecified: Secondary | ICD-10-CM | POA: Diagnosis not present

## 2017-09-22 DIAGNOSIS — I480 Paroxysmal atrial fibrillation: Secondary | ICD-10-CM | POA: Diagnosis not present

## 2017-09-22 NOTE — Telephone Encounter (Signed)
Lm with patient daughter to call and schedule appointment

## 2017-09-23 NOTE — Telephone Encounter (Signed)
Spoke to patient she is coming on 09/27/17

## 2017-09-27 ENCOUNTER — Encounter: Payer: Self-pay | Admitting: Internal Medicine

## 2017-09-27 ENCOUNTER — Encounter: Payer: Medicare HMO | Admitting: Internal Medicine

## 2017-09-27 ENCOUNTER — Ambulatory Visit (INDEPENDENT_AMBULATORY_CARE_PROVIDER_SITE_OTHER): Payer: Medicare HMO | Admitting: Internal Medicine

## 2017-09-27 VITALS — BP 160/80 | HR 63 | Ht 66.0 in | Wt 132.0 lb

## 2017-09-27 DIAGNOSIS — Z950 Presence of cardiac pacemaker: Secondary | ICD-10-CM | POA: Diagnosis not present

## 2017-09-27 DIAGNOSIS — I495 Sick sinus syndrome: Secondary | ICD-10-CM

## 2017-09-27 DIAGNOSIS — I480 Paroxysmal atrial fibrillation: Secondary | ICD-10-CM | POA: Diagnosis not present

## 2017-09-27 NOTE — Progress Notes (Signed)
Patient Care Team: Kirk Ruths, MD as PCP - General (Unknown Physician Specialty)   HPI  Alejandra Wiley is a 84 y.o. female seen in followup for palpitations associated with atrial arrhythmias as well as bradycardia requiring backup bradycardia pacing. She takes flecainide   Her husband is no longer at home.    Hospitalized for kyphoplasty.  Complicated by atrial tachycardia with pacing at upper rate limit   Echocardiogram 2103 demonstrated normal left ventricular function  Date Cr Hgb  8/18 0.67 12.2  9/18 0.64 10   Hospitalized 8/19 with syncope, thought prob orthostatic with objective evidence variable, ( one day no change, the next day 30 + HR increase.)   Also preceded by atrial fib which might have contributed to assoc weakness and fatigue and excessive rates assoc with walking>> rate response reprogrammed   Flecainide was increased  \ DATE PR interval QRSduration Dose  6/13 AR 268 112 50  6/19 AR 308  VP 138 75  9/19 -- -- 100   She comes in today significantly weak, fatigued, with lightheadedness with standing.  She awakened the other night with significant shortness of breath and was treated with oxygen.  She has had some nocturnal peripheral edema  Past Medical History:  Diagnosis Date  . Bradycardia   . 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 CHF (congestive heart failure) ([place])   . Hypertension   . Pacemaker 12/06/2003   Medtronic Impulse DTDR01  . Pacemaker 2015  . PAF (paroxysmal atrial fibrillation) (HCC)    a. on Coumadin and flecainide; b. CHADS2VASc 3 (HTN, age x 2, female)  . Sick sinus syndrome ([place])   . Sinoatrial node dysfunction (HCC)   . Vaginal intraepithelial neoplasia     Past Surgical History:  Procedure Laterality Date  . APPENDECTOMY    . HERNIA REPAIR     lower abd  . IMAGE GUIDED SINUS SURGERY  2015  . INSERT / REPLACE / REMOVE PACEMAKER  12/06/2003   Medtronic impulse DTDR01  . JOINT REPLACEMENT  Right    knee  . KYPHOPLASTY N/A 06/23/2017   Procedure: [id number];  Surgeon: Hessie Knows, MD;  Location: [facility] ORS;  Service: Orthopedics;  Laterality: N/A;  . LEEP  1999  . ORIF HIP FRACTURE Right 02/2016  . PACEMAKER GENERATOR CHANGE N/A 09/18/2012   Procedure: PACEMAKER GENERATOR CHANGE;  Surgeon: Deboraha Sprang, MD;  Location: [facility];  Service: Cardiovascular;  Laterality: N/A;  . TONSILLECTOMY    . TOTAL KNEE ARTHROPLASTY    . TOTAL KNEE REVISION Right 09/23/2016   Procedure: TOTAL KNEE REVISION-POLYETHYLENE EXCHANGE;  Surgeon: Hessie Knows, MD;  Location: [facility] ORS;  Service: Orthopedics;  Laterality: Right;  Marland Kitchen VAGINAL HYSTERECTOMY      Current Outpatient Medications  Medication Sig Dispense Refill  . acetaminophen (TYLENOL) 500 MG tablet Take 500 mg by mouth every 8 (eight) hours as needed for mild pain or headache.    . diphenhydrAMINE (BENADRYL) 25 mg capsule Take 25 mg by mouth at bedtime as needed for sleep. Takes along with Tylenol     . flecainide (TAMBOCOR) 100 MG tablet Take 1 tablet (100 mg total) by mouth 2 (two) times daily. 60 tablet 0  . fluticasone (FLONASE) 50 MCG/ACT nasal spray Place 2 sprays into both nostrils daily.    Marland Kitchen ibandronate (BONIVA) 150 MG tablet Take 150 mg by mouth every 30 (thirty) days. Take in the morning with a full glass  of water, on an empty stomach, and do not take anything else by mouth or lie down for the next 30 min.    . Multiple Vitamin (MULTI VITAMIN DAILY PO) Take 1 tablet by mouth daily. Centrum silver    . nadolol (CORGARD) 20 MG tablet Take 1 tablet (20 mg total) by mouth 2 (two) times daily. 60 tablet 0  . OVER THE COUNTER MEDICATION Place 1 spray into the nose daily. NeilMed Sinus Rinse    . warfarin (COUMADIN) 1 MG tablet Take 2 mg by mouth 2 (two) times daily.      No current facility-administered medications for this visit.     Allergies  Allergen Reactions  . Latex Other (See Comments)    blisters  . Other Other (See  Comments)    Blisters/ paper tape is Market researcher paper tape is Market researcher paper tape is Ok   . Tape Other (See Comments)    Blisters/ paper tape is Ok  . Xarelto [Rivaroxaban] Other (See Comments)    Gums bleeding and too much bruising  . Nickel Rash and Other (See Comments)    Review of Systems negative except from HPI and PMH  Physical Exam Ht 5\' 6"  (1.676 m)   Wt 132 lb (59.9 kg)   BMI 21.31 kg/m  Well developed and nourished in no acute distress but visibly worn out HENT normal Neck supple with JVP-flat Clear Regular rate and rhythm, no murmurs or gallops Abd-soft with active BS No Clubbing cyanosis tr edema Skin-warm and dry A & Oriented  Grossly normal sensory and motor function   ECG ventricular pacing with intermittent failure of atrial capture  Chest x-ray reviewed.  Atrial lead still with a J but this was a passive Scientist, forensic and  Plan \ Atrial arrhtyhmia/Fib  Sinus node dysfunction  Hypertension  Syncope  Pacemaker  Medtronic    Atrial lead failure to capture  Orthostatic hypotension     Pacemaker failing to capture atrium may be contributing to dyspnea and lightheadedness.  Threshold was quite high.  I reprogrammed it above threshold (supranormal) to assure capture.  We will also increase a lower rate to 70.  Extensively the physiology of orthostatic hypotension in the context of hypertension.  For right now, the issues of her atrial arrhythmia as suggested by the histograms on her device is uninterpretable.  Hence, we will leave the medications as they were and reassess in a few weeks.  We have reviewed nonpharmacological therapy for orthostatic hypotension.   We will raise the head of her bed.  Abdominal binder/spanks.  Isometrics prior to standing.  More than 50% of 45 min was spent in counseling related to the above

## 2017-09-27 NOTE — Patient Instructions (Signed)
Medication Instructions: - Your physician recommends that you continue on your current medications as directed. Please refer to the Current Medication list given to you today.   Labwork: - none ordered  Procedures/Testing: - none ordered  Follow-Up: - pending- Dr. Olin Pia nurse, Nira Conn will call you on Thursday morning to see how you are feeling so Dr. Caryl Comes can be made aware and determine when to see you back next.   Any Additional Special Instructions Will Be Listed Below (If Applicable).  - please wear an abdominal binder/ spanx when upright to help support your blood pressure   If you need a refill on your cardiac medications before your next appointment, please call your pharmacy.

## 2017-09-28 ENCOUNTER — Ambulatory Visit: Payer: Medicare HMO | Admitting: Family

## 2017-09-29 ENCOUNTER — Telehealth: Payer: Self-pay | Admitting: Internal Medicine

## 2017-09-29 NOTE — Telephone Encounter (Signed)
Dr. Caryl Comes also dictated a letter for the patient due to airline tickets that she has for early October. Letter mailed to the patient.

## 2017-09-29 NOTE — Telephone Encounter (Signed)
I called and spoke with the patient to follow up on how she is feeling today as compared to her office visit on 09/27/17. Per the patient, she feels better than she did on Tuesday, but she is having some SOB.   She is not having any dizziness or lightheadedness currently. She has not been outside of her room today at [facility], but did walk yesterday. She reports she just feels like when she moves, she needs to "breathe more."  The patient reports at that about 5-7 nights ago at [facility], she woke up in a sweat and panting for breath. Her O2 sats were 83 % at the time.  She wore oxygen for a couple of nights, but then nursing advised her that insurance would not pay for O2 without 2 documented episodes of low oxygen saturation levels. She has not worn O2 in about 4 nights. She states that her O2 levels have been checked and she is in the 90's.  The patient will be moving back to her villa at [facility] this afternoon about 3 pm. She states she has ordered a pulse oximeter to be used at home.  I have advised her I will update Dr. Caryl Comes as to how she is feeling today and call her back with further recommendations.  The patient is agreeable and verbalizes understanding.

## 2017-09-29 NOTE — Telephone Encounter (Signed)
I reviewed how the patient is doing today with Dr. Caryl Comes. He advised that he did not want to make any further changes at this time. He hopes that the way her device was re-programmed will help the way she is feeling. He would like to see her back in about 2 weeks, prior to him leaving town.  I have called the patient and notified her of the above recommendations.  She voices understanding and is agreeable.  She is scheduled to see Dr. Caryl Comes on 10/11/17 at 9:00 am.

## 2017-10-06 DIAGNOSIS — R5383 Other fatigue: Secondary | ICD-10-CM | POA: Diagnosis not present

## 2017-10-06 DIAGNOSIS — I480 Paroxysmal atrial fibrillation: Secondary | ICD-10-CM | POA: Diagnosis not present

## 2017-10-08 NOTE — Progress Notes (Signed)
Cardiology Office Note  Date:  10/10/2017   ID:  Alejandra Wiley, DOB 11/10/1932, MRN [id number]  PCP:  Kirk Ruths, MD   Chief Complaint  Patient presents with  . other    Follow up from [facility]; CHF. Meds reviewed by the pt. verbally. Pt. c/o LE edema, chest pain with changing of positions and shortness of breath.     HPI:  Alejandra Wiley is a 84 y.o. female with PMH of  atrial arrhythmias  Pacer Hospitalized 8/19 with syncope, orthostatic hypotension Atrial fib, CHADS2VASc 3 (HTN, age x 2, female) Chronic diastolic CHF Followed by Dr. Caryl Comes  Presenting for hospital follow up, syncope Hospital records reviewed with the patient in detail Failure to thrive,  Very weak,  Does driving and groceries by herself but having difficulty Likely losing weight, very thin on exam Concern for malnutrition Telemetry reviewed showing short runs of paroxysmal atrial fibrillation flecainide up to 100 mg twice daily, nadolol  20 bid  Echo Normal EF 55%  Chronic morningNausea, throwing up in the AM sometimes Better as the morning goes on Mild left leg swelling, chronic issue  Lives at [facility], independent living Drinks ensure Has not been going shopping relying on friends  Pyelonephritis 08/2017  Labs: Sodium 131, slowly dropping Primary care told her to drink more  Presents today in a wheelchair with a friend  Reports that she is not receiving physical therapy None was ordered by the hospital by primary care [facility] PTs not in her network  EKG personally reviewed by myself on todays visit Shows NSR LBBB rate 79 bpm   PMH:   has a past medical history of Bradycardia, Cancer ([place]), CHF (congestive heart failure) ([place]), Hypertension, Pacemaker (12/06/2003), Pacemaker (2015), PAF (paroxysmal atrial fibrillation) ([place]), Sick sinus syndrome ([place]), Sinoatrial node dysfunction ([place]), and Vaginal intraepithelial neoplasia.  PSH:    Past Surgical History:  Procedure  Laterality Date  . APPENDECTOMY    . HERNIA REPAIR     lower abd  . IMAGE GUIDED SINUS SURGERY  2015  . INSERT / REPLACE / REMOVE PACEMAKER  12/06/2003   Medtronic impulse DTDR01  . JOINT REPLACEMENT Right    knee  . KYPHOPLASTY N/A 06/23/2017   Procedure: [id number];  Surgeon: Hessie Knows, MD;  Location: [facility] ORS;  Service: Orthopedics;  Laterality: N/A;  . LEEP  1999  . ORIF HIP FRACTURE Right 02/2016  . PACEMAKER GENERATOR CHANGE N/A 09/18/2012   Procedure: PACEMAKER GENERATOR CHANGE;  Surgeon: Deboraha Sprang, MD;  Location: [facility] CATH LAB;  Service: Cardiovascular;  Laterality: N/A;  . TONSILLECTOMY    . TOTAL KNEE ARTHROPLASTY    . TOTAL KNEE REVISION Right 09/23/2016   Procedure: TOTAL KNEE REVISION-POLYETHYLENE EXCHANGE;  Surgeon: Hessie Knows, MD;  Location: [facility] ORS;  Service: Orthopedics;  Laterality: Right;  Marland Kitchen VAGINAL HYSTERECTOMY      Current Outpatient Medications  Medication Sig Dispense Refill  . acetaminophen (TYLENOL) 500 MG tablet Take 500 mg by mouth every 8 (eight) hours as needed for mild pain or headache.    . diphenhydrAMINE (BENADRYL) 25 mg capsule Take 25 mg by mouth at bedtime as needed for sleep. Takes along with Tylenol     . flecainide (TAMBOCOR) 100 MG tablet Take 1 tablet (100 mg total) by mouth 2 (two) times daily. 60 tablet 0  . fluticasone (FLONASE) 50 MCG/ACT nasal spray Place 2 sprays into both nostrils daily.    Marland Kitchen ibandronate (BONIVA) 150 MG tablet Take 150  mg by mouth every 30 (thirty) days. Take in the morning with a full glass of water, on an empty stomach, and do not take anything else by mouth or lie down for the next 30 min.    . Multiple Vitamin (MULTI VITAMIN DAILY PO) Take 1 tablet by mouth daily. Centrum silver    . nadolol (CORGARD) 20 MG tablet Take 1 tablet (20 mg total) by mouth 2 (two) times daily. 60 tablet 0  . OVER THE COUNTER MEDICATION Place 1 spray into the nose daily. NeilMed Sinus Rinse    . warfarin (COUMADIN) 1 MG tablet Take  2 mg by mouth 2 (two) times daily.      No current facility-administered medications for this visit.      Allergies:   Latex; Other; Tape; Xarelto [rivaroxaban]; and Nickel   Social History:  The patient  reports that she has never smoked. She has never used smokeless tobacco. She reports that she drinks about 1.0 standard drinks of alcohol per week. She reports that she does not use drugs.   Family History:   family history includes Diabetes in her daughter and son; Heart disease in her father; Stroke in her mother.    Review of Systems: Review of Systems  Constitutional: Negative.   Respiratory: Negative.   Cardiovascular: Negative.   Gastrointestinal: Negative.   Musculoskeletal: Negative.   Neurological: Negative.   Psychiatric/Behavioral: Negative.   All other systems reviewed and are negative.    PHYSICAL EXAM: VS:  BP 132/80 (BP Location: Left Arm, Patient Position: Sitting, Cuff Size: Normal)   Pulse 79   Ht 5\' 7"  (1.702 m)   Wt 128 lb 8 oz (58.3 kg)   BMI 20.13 kg/m  , BMI Body mass index is 20.13 kg/m. GEN: thin, muscle atrophy,  in no acute distress , presents in a wheelchair HEENT: normal  Neck: no JVD, carotid bruits, or masses Cardiac: RRR; no murmurs, rubs, or gallops,no edema  Respiratory:  clear to auscultation bilaterally, normal work of breathing GI: soft, nontender, nondistended, + BS MS: no deformity or atrophy  Skin: warm and dry, no rash Neuro:  Strength and sensation are intact Psych: euthymic mood, full affect   Recent Labs: 06/23/2017: Magnesium 1.9 09/12/2017: ALT 50; TSH 5.321 09/15/2017: BUN 15; Creatinine, Ser 0.72; Potassium 4.1; Sodium 134 09/16/2017: Hemoglobin 10.0; Platelets 173    Lipid Panel No results found for: CHOL, HDL, LDLCALC, TRIG    Wt Readings from Last 3 Encounters:  10/10/17 128 lb 8 oz (58.3 kg)  09/27/17 132 lb (59.9 kg)  09/13/17 130 lb 14.4 oz (59.4 kg)       ASSESSMENT AND PLAN:  Paroxysmal atrial  fibrillation (HCC) -  Flecainide and atenolol dose was increased while she was in the hospital On warfarin Recent hospital records reviewed  Sinus node dysfunction (HCC) Followed by Dr. Caryl Comes  Chronic diastolic heart failure [facility]) - appears relatively euvolemic on today's visit  Cardiac pacemaker in situ Followed by EP, Dr. Caryl Comes  Severe protein-calorie malnutrition [facility]) Long discussion with patient and friend Patient no longer driving, relying on others for food "I can call a cafeteria at [facility]" Previously noted she was eating crackers and peanut butter Dramatic weight loss Since hip surgery 1-1/2 years ago with associated weakness, increased fall risk  Essential hypertension Blood pressure is well controlled on today's visit. No changes made to the medications.  Syncope and collapse [facility] records reviewed Concern for paroxysmal atrial fibrillation Denies any  recurrent symptoms High risk of mechanical falls  High fall risk We have ordered physical therapy, occupational therapy  Shortness of breath Very weak, muscle atrophy arms chest legs Difficulty talking at times  Disposition:   F/U  12 months   Total encounter time more than 45 minutes  Greater than 50% was spent in counseling and coordination of care with the patient    Orders Placed This Encounter  Procedures  . Ambulatory referral to Home Health  . EKG 12-Lead     Signed, Esmond Plants, M.D., Ph.D. 10/10/2017  [place], [place]

## 2017-10-10 ENCOUNTER — Ambulatory Visit: Payer: Medicare HMO | Admitting: Cardiovascular Disease

## 2017-10-10 ENCOUNTER — Encounter: Payer: Self-pay | Admitting: Cardiovascular Disease

## 2017-10-10 VITALS — BP 132/80 | HR 79 | Ht 67.0 in | Wt 128.5 lb

## 2017-10-10 DIAGNOSIS — I10 Essential (primary) hypertension: Secondary | ICD-10-CM | POA: Diagnosis not present

## 2017-10-10 DIAGNOSIS — I495 Sick sinus syndrome: Secondary | ICD-10-CM | POA: Insufficient documentation

## 2017-10-10 DIAGNOSIS — E43 Unspecified severe protein-calorie malnutrition: Secondary | ICD-10-CM | POA: Diagnosis not present

## 2017-10-10 DIAGNOSIS — R296 Repeated falls: Secondary | ICD-10-CM | POA: Insufficient documentation

## 2017-10-10 DIAGNOSIS — R531 Weakness: Secondary | ICD-10-CM | POA: Diagnosis not present

## 2017-10-10 DIAGNOSIS — I5032 Chronic diastolic (congestive) heart failure: Secondary | ICD-10-CM

## 2017-10-10 DIAGNOSIS — W19XXXA Unspecified fall, initial encounter: Secondary | ICD-10-CM | POA: Insufficient documentation

## 2017-10-10 DIAGNOSIS — R55 Syncope and collapse: Secondary | ICD-10-CM | POA: Diagnosis not present

## 2017-10-10 DIAGNOSIS — W19XXXD Unspecified fall, subsequent encounter: Secondary | ICD-10-CM | POA: Diagnosis not present

## 2017-10-10 DIAGNOSIS — M6250 Muscle wasting and atrophy, not elsewhere classified, unspecified site: Secondary | ICD-10-CM

## 2017-10-10 DIAGNOSIS — I480 Paroxysmal atrial fibrillation: Secondary | ICD-10-CM | POA: Diagnosis not present

## 2017-10-10 DIAGNOSIS — Z950 Presence of cardiac pacemaker: Secondary | ICD-10-CM

## 2017-10-10 DIAGNOSIS — E46 Unspecified protein-calorie malnutrition: Secondary | ICD-10-CM | POA: Insufficient documentation

## 2017-10-10 NOTE — Patient Instructions (Addendum)
We will arrange physical therapy for you  Medication Instructions:   No medication changes made  Labwork:  No new labs needed  Testing/Procedures:  No further testing at this time   Follow-Up: It was a pleasure seeing you in the office today. Please call us if you have new issues that need to be addressed before your next appt.  [phone]  Your physician wants you to follow-up in: 12 months.  You will receive a reminder letter in the mail two months in advance. If you don't receive a letter, please call our office to schedule the follow-up appointment.  If you need a refill on your cardiac medications before your next appointment, please call your pharmacy.  For educational health videos Log in to : [email or web address] Or : [email or web address], password : [email or web address]

## 2017-10-11 ENCOUNTER — Ambulatory Visit (INDEPENDENT_AMBULATORY_CARE_PROVIDER_SITE_OTHER): Payer: Medicare HMO | Admitting: Internal Medicine

## 2017-10-11 ENCOUNTER — Encounter: Payer: Self-pay | Admitting: Internal Medicine

## 2017-10-11 VITALS — BP 130/82 | HR 72 | Ht 67.0 in | Wt 128.0 lb

## 2017-10-11 DIAGNOSIS — I495 Sick sinus syndrome: Secondary | ICD-10-CM

## 2017-10-11 DIAGNOSIS — Z950 Presence of cardiac pacemaker: Secondary | ICD-10-CM | POA: Diagnosis not present

## 2017-10-11 DIAGNOSIS — R0602 Shortness of breath: Secondary | ICD-10-CM | POA: Diagnosis not present

## 2017-10-11 DIAGNOSIS — I480 Paroxysmal atrial fibrillation: Secondary | ICD-10-CM | POA: Diagnosis not present

## 2017-10-11 NOTE — Patient Instructions (Addendum)
Medication Instructions: - Your physician recommends that you continue on your current medications as directed. Please refer to the Current Medication list given to you today.  Labwork: - Your physician recommends that you have lab work tomorrow at [facility]-  BNP-  (order given)  Procedures/Testing: - none ordered  Follow-Up: -  As scheduled with Darylene Price, NP in the [facility]- 10/21/17  - As scheduled with Dr. Caryl Comes on 11/08/17   Any Additional Special Instructions Will Be Listed Below (If Applicable).     If you need a refill on your cardiac medications before your next appointment, please call your pharmacy.

## 2017-10-11 NOTE — Progress Notes (Signed)
Patient Care Team: Kirk Ruths, MD as PCP - General (Unknown Physician Specialty)   HPI  Alejandra Wiley is a 84 y.o. female seen in followup for palpitations associated with atrial arrhythmias as well as bradycardia requiring backup bradycardia pacing. She takes flecainide   Her husband is no longer at home.    Hospitalized for kyphoplasty.  Complicated by atrial tachycardia with pacing at upper rate limit   Echocardiogram 2103 demonstrated normal left ventricular function  Date Cr Hgb  8/18 0.67 12.2  9/18 0.64 10   Hospitalized 8/19 with syncope, thought prob orthostatic with objective evidence variable, ( one day no change, the next day 30 + HR increase.)   Also preceded by atrial fib which might have contributed to assoc weakness and fatigue and excessive rates assoc with walking>> rate response reprogrammed   Flecainide was increased   Pacemaker interrogation >> atrial failure to capture and device reprogrammed; also  leading to uninterpretable data re Atrial arrhythmia \ DATE PR interval QRSduration Dose  6/13 AR 268 112 50  6/19 AR 308  VP 138 75  9/19 -- -- 100     She is less lightheaded than last visit but remains sob and has noted peripheral edema,    Also fearful of falling and losing her independence   Past Medical History:  Diagnosis Date  . Bradycardia   . 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 CHF (congestive heart failure) ([place])   . Hypertension   . Pacemaker 12/06/2003   Medtronic Impulse DTDR01  . Pacemaker 2015  . PAF (paroxysmal atrial fibrillation) (HCC)    a. on Coumadin and flecainide; b. CHADS2VASc 3 (HTN, age x 2, female)  . Sick sinus syndrome ([place])   . Sinoatrial node dysfunction (HCC)   . Vaginal intraepithelial neoplasia     Past Surgical History:  Procedure Laterality Date  . APPENDECTOMY    . HERNIA REPAIR     lower abd  . IMAGE GUIDED SINUS SURGERY  2015  . INSERT / REPLACE / REMOVE PACEMAKER   12/06/2003   Medtronic impulse DTDR01  . JOINT REPLACEMENT Right    knee  . KYPHOPLASTY N/A 06/23/2017   Procedure: [id number];  Surgeon: Hessie Knows, MD;  Location: [facility] ORS;  Service: Orthopedics;  Laterality: N/A;  . LEEP  1999  . ORIF HIP FRACTURE Right 02/2016  . PACEMAKER GENERATOR CHANGE N/A 09/18/2012   Procedure: PACEMAKER GENERATOR CHANGE;  Surgeon: Deboraha Sprang, MD;  Location: [facility] CATH LAB;  Service: Cardiovascular;  Laterality: N/A;  . TONSILLECTOMY    . TOTAL KNEE ARTHROPLASTY    . TOTAL KNEE REVISION Right 09/23/2016   Procedure: TOTAL KNEE REVISION-POLYETHYLENE EXCHANGE;  Surgeon: Hessie Knows, MD;  Location: [facility] ORS;  Service: Orthopedics;  Laterality: Right;  Marland Kitchen VAGINAL HYSTERECTOMY      Current Outpatient Medications  Medication Sig Dispense Refill  . acetaminophen (TYLENOL) 500 MG tablet Take 500 mg by mouth every 8 (eight) hours as needed for mild pain or headache.    . diphenhydrAMINE (BENADRYL) 25 mg capsule Take 25 mg by mouth at bedtime as needed for sleep. Takes along with Tylenol     . flecainide (TAMBOCOR) 100 MG tablet Take 1 tablet (100 mg total) by mouth 2 (two) times daily. 60 tablet 0  . fluticasone (FLONASE) 50 MCG/ACT nasal spray Place 2 sprays into both nostrils daily.    Marland Kitchen ibandronate (BONIVA) 150 MG tablet Take 150 mg  by mouth every 30 (thirty) days. Take in the morning with a full glass of water, on an empty stomach, and do not take anything else by mouth or lie down for the next 30 min.    . Multiple Vitamin (MULTI VITAMIN DAILY PO) Take 1 tablet by mouth daily. Centrum silver    . nadolol (CORGARD) 20 MG tablet Take 1 tablet (20 mg total) by mouth 2 (two) times daily. 60 tablet 0  . OVER THE COUNTER MEDICATION Place 1 spray into the nose daily. NeilMed Sinus Rinse    . warfarin (COUMADIN) 1 MG tablet Take 2 mg by mouth 2 (two) times daily.      No current facility-administered medications for this visit.     Allergies  Allergen Reactions  .  Latex Other (See Comments)    blisters  . Other Other (See Comments)    Blisters/ paper tape is Market researcher paper tape is Market researcher paper tape is Ok  Camera operator is Ok  . Tape Other (See Comments)    Blisters/ paper tape is Market researcher paper tape is Ok  . Xarelto [Rivaroxaban] Other (See Comments)    Gums bleeding and too much bruising  . Nickel Rash and Other (See Comments)    Review of Systems negative except from HPI and PMH  Physical Exam BP 130/82 (BP Location: Left Arm, Patient Position: Sitting, Cuff Size: Normal)   Pulse 72   Ht 5\' 7"  (1.702 m)   Wt 128 lb (58.1 kg)   SpO2 98%   BMI 20.05 kg/m  Well developed and nourished in no acute distress HENT normal Neck supple with JVP-+ 8-10  Clear Regular rate and rhythm, no murmurs or gallops Abd-soft with active BS No Clubbing cyanosis tr edema Skin-warm and dry A & Oriented  Grossly normal sensory and motor function   ECG       Assessment and  Plan \ Atrial arrhtyhmia/Fib  Sinus node dysfunction  Hypertension  Syncope  Pacemaker  Medtronic    Elevated pacing thresholds A Greater Than V  HFpEF  Orthostatic hypotension     No intercurrent atrial fibrillation  Atrial thresholds remain elevated; flecainide has been chronic, but it may be the culprit.  We will continue it anyway.  orthostasis is improved; no syncope  BP well controlled   Volume overloaded.  Dr. Ouida Sills had suggested she was intravascularly depleted and recommended Gatorade; I will reach out to him.  We will arrange for a BNP although it is sensitivity will be diminished given her age.  She has follow-[facility] with [facility].  We will let her reassess volume status.  We also discussed issues related to end-of-life care and how best to maintain independence  She continues with orthostasis although somewhat abated.  She remains fearful of falling  More than 50% of 40 min was spent in counseling related to  the above

## 2017-10-12 ENCOUNTER — Ambulatory Visit: Payer: Medicare HMO | Admitting: Family

## 2017-10-12 DIAGNOSIS — R0602 Shortness of breath: Secondary | ICD-10-CM | POA: Diagnosis not present

## 2017-10-12 DIAGNOSIS — I480 Paroxysmal atrial fibrillation: Secondary | ICD-10-CM | POA: Diagnosis not present

## 2017-10-12 LAB — CUP PACEART INCLINIC DEVICE CHECK
Battery Remaining Longevity: 66 mo
Battery Voltage: 2.78 V
Brady Statistic AP VS Percent: 1 %
Date Time Interrogation Session: 20190910142451
Implantable Lead Implant Date: 20051130
Implantable Lead Location: 753859
Implantable Lead Location: 753860
Lead Channel Impedance Value: 513 Ohm
Lead Channel Pacing Threshold Amplitude: 2 V
Lead Channel Pacing Threshold Pulse Width: 1 ms
Lead Channel Sensing Intrinsic Amplitude: 0.7 mV
Lead Channel Sensing Intrinsic Amplitude: 11.2 mV
Lead Channel Setting Pacing Amplitude: 2.75 V
Lead Channel Setting Pacing Pulse Width: 0.46 ms
MDC IDC LEAD IMPLANT DT: 20051130
MDC IDC MSMT BATTERY IMPEDANCE: 506 Ohm
MDC IDC MSMT LEADCHNL RV IMPEDANCE VALUE: 570 Ohm
MDC IDC MSMT LEADCHNL RV PACING THRESHOLD AMPLITUDE: 1.375 V
MDC IDC MSMT LEADCHNL RV PACING THRESHOLD PULSEWIDTH: 0.4 ms
MDC IDC PG IMPLANT DT: 20140901
MDC IDC SET LEADCHNL RA PACING AMPLITUDE: 5 V
MDC IDC SET LEADCHNL RV SENSING SENSITIVITY: 2.8 mV
MDC IDC STAT BRADY AP VP PERCENT: 42 %
MDC IDC STAT BRADY AS VP PERCENT: 12 %
MDC IDC STAT BRADY AS VS PERCENT: 45 %

## 2017-10-17 DIAGNOSIS — I10 Essential (primary) hypertension: Secondary | ICD-10-CM | POA: Diagnosis not present

## 2017-10-17 DIAGNOSIS — M6281 Muscle weakness (generalized): Secondary | ICD-10-CM | POA: Diagnosis not present

## 2017-10-17 DIAGNOSIS — I480 Paroxysmal atrial fibrillation: Secondary | ICD-10-CM | POA: Diagnosis not present

## 2017-10-17 DIAGNOSIS — R2689 Other abnormalities of gait and mobility: Secondary | ICD-10-CM | POA: Diagnosis not present

## 2017-10-19 DIAGNOSIS — Z950 Presence of cardiac pacemaker: Secondary | ICD-10-CM | POA: Diagnosis not present

## 2017-10-19 DIAGNOSIS — I5032 Chronic diastolic (congestive) heart failure: Secondary | ICD-10-CM | POA: Diagnosis not present

## 2017-10-19 DIAGNOSIS — M6281 Muscle weakness (generalized): Secondary | ICD-10-CM | POA: Diagnosis not present

## 2017-10-19 DIAGNOSIS — R2689 Other abnormalities of gait and mobility: Secondary | ICD-10-CM | POA: Diagnosis not present

## 2017-10-19 DIAGNOSIS — I480 Paroxysmal atrial fibrillation: Secondary | ICD-10-CM | POA: Diagnosis not present

## 2017-10-19 DIAGNOSIS — I10 Essential (primary) hypertension: Secondary | ICD-10-CM | POA: Diagnosis not present

## 2017-10-19 DIAGNOSIS — E43 Unspecified severe protein-calorie malnutrition: Secondary | ICD-10-CM | POA: Diagnosis not present

## 2017-10-20 ENCOUNTER — Ambulatory Visit (INDEPENDENT_AMBULATORY_CARE_PROVIDER_SITE_OTHER): Payer: Medicare HMO | Admitting: *Deleted

## 2017-10-20 DIAGNOSIS — I10 Essential (primary) hypertension: Secondary | ICD-10-CM

## 2017-10-20 DIAGNOSIS — I495 Sick sinus syndrome: Secondary | ICD-10-CM

## 2017-10-20 DIAGNOSIS — Z9989 Dependence on other enabling machines and devices: Secondary | ICD-10-CM | POA: Diagnosis not present

## 2017-10-20 DIAGNOSIS — I480 Paroxysmal atrial fibrillation: Secondary | ICD-10-CM | POA: Diagnosis not present

## 2017-10-20 DIAGNOSIS — I5032 Chronic diastolic (congestive) heart failure: Secondary | ICD-10-CM | POA: Diagnosis not present

## 2017-10-20 NOTE — Progress Notes (Signed)
Patient ID: Alejandra Wiley, female    DOB: 11-12-1932, 84 y.o.   MRN: [id number]  HPI  Alejandra Wiley is a 84 y/o female with a history of vaginal cancer, sick sinus syndrome, paroxysmal atrial fibrillation, HTN and chronic heart failure.   Echo report from 09/14/17 reviewed and showed an EF of 50-55% along with mod/severe MR and mild/mod TR with atrial fibrillation. Echo report from 06/24/17 reviewed and showed an EF of 50-55% along with mild/moderate MR and a mildly elevated PA pressure of 44 mm Hg.   Admitted 09/12/17 due to syncope. Cardiology and EP consults obtained. Negative fractures or bleeding in the brain. No pulmonary embolus. Pacemaker was reprogrammed. Discharged to SNF after 4 days for rehab. Admitted 08/26/17 due to acute pyelonephritis. Initially given IV antibiotics. Head CT was negative. Discharged the next day. Admitted 06/23/17 due to atrial tachycardia and pulmonary edema after kyphoplasty of T-9 on 06/23/17. Cardiology consult obtained. Discharged the following day.   She presents today for a follow-up visit with a chief complaint of moderate shortness of breath upon minimal exertion. She describes this as chronic in nature having been present for several years although has gotten worse over the last few months. She has associated fatigue, pedal edema, palpitations, light-headedness, anxiety, difficulty sleeping and weight gain along with this. She has been checking her BP daily and upon review, her morning BP's are high prior to taking her medications but then comes down after taking her medications. She has noticed a gradual weight gain.   Past Medical History:  Diagnosis Date  . Bradycardia   . Cancer ([place])    vaginal intraepithelial neoplasia  . CHF (congestive heart failure) ([place])   . Hypertension   . Pacemaker 12/06/2003   Medtronic Impulse DTDR01  . Pacemaker 2015  . PAF (paroxysmal atrial fibrillation) (HCC)    a. on Coumadin and flecainide; b. CHADS2VASc 3 (HTN, age x 2,  female)  . Sick sinus syndrome ([place])   . Sinoatrial node dysfunction (HCC)   . Vaginal intraepithelial neoplasia    Past Surgical History:  Procedure Laterality Date  . APPENDECTOMY    . HERNIA REPAIR     lower abd  . IMAGE GUIDED SINUS SURGERY  2015  . INSERT / REPLACE / REMOVE PACEMAKER  12/06/2003   Medtronic impulse DTDR01  . JOINT REPLACEMENT Right    knee  . KYPHOPLASTY N/A 06/23/2017   Procedure: [id number];  Surgeon: Hessie Knows, MD;  Location: [facility] ORS;  Service: Orthopedics;  Laterality: N/A;  . LEEP  1999  . ORIF HIP FRACTURE Right 02/2016  . PACEMAKER GENERATOR CHANGE N/A 09/18/2012   Procedure: PACEMAKER GENERATOR CHANGE;  Surgeon: Deboraha Sprang, MD;  Location: [facility] CATH LAB;  Service: Cardiovascular;  Laterality: N/A;  . TONSILLECTOMY    . TOTAL KNEE ARTHROPLASTY    . TOTAL KNEE REVISION Right 09/23/2016   Procedure: TOTAL KNEE REVISION-POLYETHYLENE EXCHANGE;  Surgeon: Hessie Knows, MD;  Location: [facility] ORS;  Service: Orthopedics;  Laterality: Right;  Marland Kitchen VAGINAL HYSTERECTOMY     Family History  Problem Relation Age of Onset  . Stroke Mother   . Heart disease Father   . Diabetes Daughter   . Diabetes Son    Social History   Tobacco Use  . Smoking status: Never Smoker  . Smokeless tobacco: Never Used  Substance Use Topics  . Alcohol use: Yes    Alcohol/week: 1.0 standard drinks    Types: 1 Glasses of wine per week  Comment: occasionally   Allergies  Allergen Reactions  . Latex Other (See Comments)    blisters  . Other Other (See Comments)    Blisters/ paper tape is Market researcher paper tape is Market researcher paper tape is Ok  Camera operator is Ok  . Tape Other (See Comments)    Blisters/ paper tape is Market researcher paper tape is Ok  . Xarelto [Rivaroxaban] Other (See Comments)    Gums bleeding and too much bruising  . Nickel Rash and Other (See Comments)   Prior to Admission medications   Medication Sig Start Date End Date Taking?  Authorizing Provider  acetaminophen (TYLENOL) 500 MG tablet Take 500 mg by mouth every 8 (eight) hours as needed for mild pain or headache.   Yes [provider]  diphenhydrAMINE (BENADRYL) 25 mg capsule Take 25 mg by mouth at bedtime as needed for sleep. Takes along with Tylenol    Yes [provider]  flecainide (TAMBOCOR) 100 MG tablet Take 1 tablet (100 mg total) by mouth 2 (two) times daily. 09/14/17 10/21/17 Yes Pyreddy, Reatha Harps, MD  fluticasone (FLONASE) 50 MCG/ACT nasal spray Place 2 sprays into both nostrils daily.   Yes [provider]  ibandronate (BONIVA) 150 MG tablet Take 150 mg by mouth every 30 (thirty) days. Take in the morning with a full glass of water, on an empty stomach, and do not take anything else by mouth or lie down for the next 30 min.   Yes [provider]  Multiple Vitamin (MULTI VITAMIN DAILY PO) Take 1 tablet by mouth daily. Centrum silver   Yes [provider]  nadolol (CORGARD) 20 MG tablet Take 1 tablet (20 mg total) by mouth 2 (two) times daily. 09/14/17 10/21/17 Yes Pyreddy, Reatha Harps, MD  OVER THE COUNTER MEDICATION Place 1 spray into the nose daily. NeilMed Sinus Rinse   Yes [provider]  warfarin (COUMADIN) 1 MG tablet Take 1 mg by mouth one time only at 6 PM.    Yes [provider]    Review of Systems  Constitutional: Positive for fatigue (tire easily).  HENT: Positive for congestion (left sinus congestion). Negative for postnasal drip and sore throat.   Eyes: Negative.   Respiratory: Positive for shortness of breath (with minimal exertion). Negative for chest tightness.   Cardiovascular: Positive for palpitations (at times) and leg swelling. Negative for chest pain.  Gastrointestinal: Negative for abdominal distention, abdominal pain, nausea and vomiting.  Endocrine: Negative.   Genitourinary: Negative.   Musculoskeletal: Positive for arthralgias (both legs hurt) and back pain.  Skin: Negative.    Allergic/Immunologic: Negative.   Neurological: Positive for light-headedness. Negative for dizziness.  Hematological: Negative for adenopathy. Does not bruise/bleed easily.  Psychiatric/Behavioral: Positive for sleep disturbance (sleeping 8-9 hours/ night along with 2 naps daily). Negative for dysphoric mood. The patient is nervous/anxious.    Vitals:   10/21/17 1250  BP: 122/66  Pulse: 73  Resp: 18  SpO2: 98%  Weight: 131 lb 4 oz (59.5 kg)  Height: 5\' 7"  (1.702 m)   Wt Readings from Last 3 Encounters:  10/21/17 131 lb 4 oz (59.5 kg)  10/11/17 128 lb (58.1 kg)  10/10/17 128 lb 8 oz (58.3 kg)   Lab Results  Component Value Date   CREATININE 0.72 09/15/2017   CREATININE 0.67 09/13/2017   CREATININE 0.84 09/12/2017    Physical Exam  Constitutional: She is oriented to person, place, and time. She appears well-developed and well-nourished.  HENT:  Head: Normocephalic and atraumatic.  Neck: Normal range of motion. Neck supple. No JVD present.  Cardiovascular: Normal rate and regular rhythm.  Pulmonary/Chest: Effort normal. No respiratory distress. She has no wheezes. She has no rhonchi. She has no rales.  Abdominal: Soft. Bowel sounds are normal.  Musculoskeletal:       Right lower leg: She exhibits edema (2+ pitting). She exhibits no tenderness.       Left lower leg: She exhibits edema (2+ pitting). She exhibits no tenderness.  Neurological: She is alert and oriented to person, place, and time.  Skin: Skin is warm and dry.  Psychiatric: She has a normal mood and affect. Her behavior is normal.  Nursing note and vitals reviewed.  Assessment & Plan:  1: Chronic heart failure with preserved ejection fraction- - NYHA class III - moderately fluid overloaded today - weighing daily and says that her weight has gradually risen. Reminded to call for an overnight weight gain of >2 pounds or a weekly weight gain of >5 pounds - weight up 7 pounds since she was last here  ~ 2 months  ago - will add furosemide 40mg  daily for the next 3 days. She is to call back 10/7 with weights and BP readings over the weekend - has labs already scheduled for next week (most recent sodium was slightly low at 134) - encouraged her to elevate her legs along with wearing compression socks - not adding salt to her food. Reviewed the importance of reading food labels and closely following a 2000mg  sodium diet.  - saw cardiology Rockey Situ) 10/10/17  2: Paroxymal atrial fibrillation- - saw EP Caryl Comes) 10/11/17 - INR 10/20/17 was 1.2 - pacemaker present - on flecainide  3: HTN- - BP looks good today in the office - saw PCP Ouida Sills) 10/20/17 - BMP 10/20/17 reviewed and showed sodium 134, potassium 4.2, creatinine 0.8  and GFR 69   Medication list was reviewed.   Return in 2 months or sooner for any questions/problems before then.

## 2017-10-20 NOTE — Progress Notes (Signed)
Remote pacemaker transmission.   

## 2017-10-21 ENCOUNTER — Ambulatory Visit: Payer: Medicare HMO | Attending: Family | Admitting: Family

## 2017-10-21 ENCOUNTER — Encounter: Payer: Self-pay | Admitting: Family

## 2017-10-21 VITALS — BP 122/66 | HR 73 | Resp 18 | Ht 67.0 in | Wt 131.2 lb

## 2017-10-21 DIAGNOSIS — Z7901 Long term (current) use of anticoagulants: Secondary | ICD-10-CM | POA: Diagnosis not present

## 2017-10-21 DIAGNOSIS — I509 Heart failure, unspecified: Secondary | ICD-10-CM | POA: Diagnosis not present

## 2017-10-21 DIAGNOSIS — Z9889 Other specified postprocedural states: Secondary | ICD-10-CM | POA: Diagnosis not present

## 2017-10-21 DIAGNOSIS — I5032 Chronic diastolic (congestive) heart failure: Secondary | ICD-10-CM

## 2017-10-21 DIAGNOSIS — Z950 Presence of cardiac pacemaker: Secondary | ICD-10-CM | POA: Insufficient documentation

## 2017-10-21 DIAGNOSIS — I495 Sick sinus syndrome: Secondary | ICD-10-CM | POA: Diagnosis not present

## 2017-10-21 DIAGNOSIS — Z96659 Presence of unspecified artificial knee joint: Secondary | ICD-10-CM | POA: Diagnosis not present

## 2017-10-21 DIAGNOSIS — Z823 Family history of stroke: Secondary | ICD-10-CM | POA: Insufficient documentation

## 2017-10-21 DIAGNOSIS — I10 Essential (primary) hypertension: Secondary | ICD-10-CM

## 2017-10-21 DIAGNOSIS — I110 Hypertensive heart disease with heart failure: Secondary | ICD-10-CM | POA: Insufficient documentation

## 2017-10-21 DIAGNOSIS — I480 Paroxysmal atrial fibrillation: Secondary | ICD-10-CM | POA: Diagnosis not present

## 2017-10-21 DIAGNOSIS — Z8249 Family history of ischemic heart disease and other diseases of the circulatory system: Secondary | ICD-10-CM | POA: Insufficient documentation

## 2017-10-21 DIAGNOSIS — Z79899 Other long term (current) drug therapy: Secondary | ICD-10-CM | POA: Insufficient documentation

## 2017-10-21 MED ORDER — FUROSEMIDE 40 MG PO TABS: 40.0000 mg | ORAL_TABLET | Freq: Every day | ORAL | 3 refills | Status: DC

## 2017-10-21 NOTE — Patient Instructions (Addendum)
Continue weighing daily and call for an overnight weight gain of > 2 pounds or a weekly weight gain of >5 pounds.  Take the furosemide daily for the next 3 days.  Call us on Monday with weights and blood pressure readings.

## 2017-10-24 ENCOUNTER — Telehealth: Payer: Self-pay | Admitting: Family

## 2017-10-24 NOTE — Telephone Encounter (Signed)
Patient called to give update on weight and BP readings since she's been on the furosemide for the last 3 days.   Weight: 10/5:   130 pounds 10/6:   127.5 pounds 10/7:   127.5 pounds  BP: 10/5:   AM 166/94 PM 150/100 10/6:   AM 156/87 PM 139/78 10/7:   AM 159/92  She takes her last furosemide later today. Advised her to continue checking her weight/BP on a daily basis. It could be that she may need a low dose diuretic to aid in BP reduction but sodium level would need to be monitored closely as it's been running on the low side.

## 2017-10-25 DIAGNOSIS — R2689 Other abnormalities of gait and mobility: Secondary | ICD-10-CM | POA: Diagnosis not present

## 2017-10-25 DIAGNOSIS — M6281 Muscle weakness (generalized): Secondary | ICD-10-CM | POA: Diagnosis not present

## 2017-10-25 DIAGNOSIS — I5032 Chronic diastolic (congestive) heart failure: Secondary | ICD-10-CM | POA: Diagnosis not present

## 2017-10-25 DIAGNOSIS — E43 Unspecified severe protein-calorie malnutrition: Secondary | ICD-10-CM | POA: Diagnosis not present

## 2017-10-25 DIAGNOSIS — I10 Essential (primary) hypertension: Secondary | ICD-10-CM | POA: Diagnosis not present

## 2017-10-25 DIAGNOSIS — Z950 Presence of cardiac pacemaker: Secondary | ICD-10-CM | POA: Diagnosis not present

## 2017-10-25 DIAGNOSIS — I480 Paroxysmal atrial fibrillation: Secondary | ICD-10-CM | POA: Diagnosis not present

## 2017-10-28 DIAGNOSIS — E43 Unspecified severe protein-calorie malnutrition: Secondary | ICD-10-CM | POA: Diagnosis not present

## 2017-10-28 DIAGNOSIS — I10 Essential (primary) hypertension: Secondary | ICD-10-CM | POA: Diagnosis not present

## 2017-10-28 DIAGNOSIS — M6281 Muscle weakness (generalized): Secondary | ICD-10-CM | POA: Diagnosis not present

## 2017-10-28 DIAGNOSIS — Z950 Presence of cardiac pacemaker: Secondary | ICD-10-CM | POA: Diagnosis not present

## 2017-10-28 DIAGNOSIS — I480 Paroxysmal atrial fibrillation: Secondary | ICD-10-CM | POA: Diagnosis not present

## 2017-10-28 DIAGNOSIS — I5032 Chronic diastolic (congestive) heart failure: Secondary | ICD-10-CM | POA: Diagnosis not present

## 2017-10-28 DIAGNOSIS — R2689 Other abnormalities of gait and mobility: Secondary | ICD-10-CM | POA: Diagnosis not present

## 2017-10-29 LAB — CUP PACEART REMOTE DEVICE CHECK
Battery Remaining Longevity: 23 mo
Battery Voltage: 2.75 V
Brady Statistic AS VP Percent: 0 %
Brady Statistic AS VS Percent: 6 %
Implantable Lead Implant Date: 20051130
Implantable Lead Location: 753859
Implantable Pulse Generator Implant Date: 20140901
Lead Channel Pacing Threshold Amplitude: 1.375 V
Lead Channel Pacing Threshold Pulse Width: 0.4 ms
Lead Channel Setting Pacing Amplitude: 2.75 V
Lead Channel Setting Sensing Sensitivity: 2 mV
MDC IDC LEAD IMPLANT DT: 20051130
MDC IDC LEAD LOCATION: 753860
MDC IDC MSMT BATTERY IMPEDANCE: 661 Ohm
MDC IDC MSMT LEADCHNL RA IMPEDANCE VALUE: 504 Ohm
MDC IDC MSMT LEADCHNL RV IMPEDANCE VALUE: 548 Ohm
MDC IDC SESS DTM: 20191003012238
MDC IDC SET LEADCHNL RA PACING AMPLITUDE: 5 V
MDC IDC SET LEADCHNL RV PACING PULSEWIDTH: 0.4 ms
MDC IDC STAT BRADY AP VP PERCENT: 94 %
MDC IDC STAT BRADY AP VS PERCENT: 0 %

## 2017-10-31 DIAGNOSIS — I480 Paroxysmal atrial fibrillation: Secondary | ICD-10-CM | POA: Diagnosis not present

## 2017-10-31 DIAGNOSIS — I10 Essential (primary) hypertension: Secondary | ICD-10-CM | POA: Diagnosis not present

## 2017-10-31 DIAGNOSIS — E43 Unspecified severe protein-calorie malnutrition: Secondary | ICD-10-CM | POA: Diagnosis not present

## 2017-10-31 DIAGNOSIS — R2689 Other abnormalities of gait and mobility: Secondary | ICD-10-CM | POA: Diagnosis not present

## 2017-10-31 DIAGNOSIS — M6281 Muscle weakness (generalized): Secondary | ICD-10-CM | POA: Diagnosis not present

## 2017-10-31 DIAGNOSIS — I5032 Chronic diastolic (congestive) heart failure: Secondary | ICD-10-CM | POA: Diagnosis not present

## 2017-10-31 DIAGNOSIS — Z950 Presence of cardiac pacemaker: Secondary | ICD-10-CM | POA: Diagnosis not present

## 2017-11-01 ENCOUNTER — Telehealth: Payer: Self-pay | Admitting: Family

## 2017-11-01 DIAGNOSIS — R791 Abnormal coagulation profile: Secondary | ICD-10-CM | POA: Diagnosis not present

## 2017-11-01 NOTE — Telephone Encounter (Signed)
Received call from patient's daughter, Charise Carwin, regarding her concerns about her mother's health. [name] Pod says that she noticed that her mother is very weak with decreased leg strength. She says that she's also having pretty consistent nausea which is then causing a decreased appetite with occasional dry heaves. She is also extremely fatigued where [name] Pod said that patient wakes up and drinks some coffee and then returns back to bed for a 1-2 hour nap. She will then return to bed for another long afternoon nap.   PT is going to resume coming twice weekly to help build up her muscle strength and her daughter spoke to her about drinking some ensure to supplement her diet.   Encouraged [name] Pod to speak with patient's PCP regarding her above concerns to make sure that he is aware of all of the above so that he can look into what could be causing the nausea. [name] Pod was appreciative of the discussion and says that she will call patient's PCP.

## 2017-11-02 DIAGNOSIS — Z950 Presence of cardiac pacemaker: Secondary | ICD-10-CM | POA: Diagnosis not present

## 2017-11-02 DIAGNOSIS — I480 Paroxysmal atrial fibrillation: Secondary | ICD-10-CM | POA: Diagnosis not present

## 2017-11-02 DIAGNOSIS — I10 Essential (primary) hypertension: Secondary | ICD-10-CM | POA: Diagnosis not present

## 2017-11-02 DIAGNOSIS — R2689 Other abnormalities of gait and mobility: Secondary | ICD-10-CM | POA: Diagnosis not present

## 2017-11-02 DIAGNOSIS — M6281 Muscle weakness (generalized): Secondary | ICD-10-CM | POA: Diagnosis not present

## 2017-11-02 DIAGNOSIS — I5032 Chronic diastolic (congestive) heart failure: Secondary | ICD-10-CM | POA: Diagnosis not present

## 2017-11-02 DIAGNOSIS — E43 Unspecified severe protein-calorie malnutrition: Secondary | ICD-10-CM | POA: Diagnosis not present

## 2017-11-03 DIAGNOSIS — M6281 Muscle weakness (generalized): Secondary | ICD-10-CM | POA: Diagnosis not present

## 2017-11-03 DIAGNOSIS — Z950 Presence of cardiac pacemaker: Secondary | ICD-10-CM | POA: Diagnosis not present

## 2017-11-03 DIAGNOSIS — E43 Unspecified severe protein-calorie malnutrition: Secondary | ICD-10-CM | POA: Diagnosis not present

## 2017-11-03 DIAGNOSIS — R2689 Other abnormalities of gait and mobility: Secondary | ICD-10-CM | POA: Diagnosis not present

## 2017-11-03 DIAGNOSIS — I5032 Chronic diastolic (congestive) heart failure: Secondary | ICD-10-CM | POA: Diagnosis not present

## 2017-11-03 DIAGNOSIS — I480 Paroxysmal atrial fibrillation: Secondary | ICD-10-CM | POA: Diagnosis not present

## 2017-11-03 DIAGNOSIS — I10 Essential (primary) hypertension: Secondary | ICD-10-CM | POA: Diagnosis not present

## 2017-11-07 DIAGNOSIS — I5032 Chronic diastolic (congestive) heart failure: Secondary | ICD-10-CM | POA: Diagnosis not present

## 2017-11-07 DIAGNOSIS — I480 Paroxysmal atrial fibrillation: Secondary | ICD-10-CM | POA: Diagnosis not present

## 2017-11-07 DIAGNOSIS — R2689 Other abnormalities of gait and mobility: Secondary | ICD-10-CM | POA: Diagnosis not present

## 2017-11-07 DIAGNOSIS — M6281 Muscle weakness (generalized): Secondary | ICD-10-CM | POA: Diagnosis not present

## 2017-11-07 DIAGNOSIS — E43 Unspecified severe protein-calorie malnutrition: Secondary | ICD-10-CM | POA: Diagnosis not present

## 2017-11-07 DIAGNOSIS — Z950 Presence of cardiac pacemaker: Secondary | ICD-10-CM | POA: Diagnosis not present

## 2017-11-07 DIAGNOSIS — I10 Essential (primary) hypertension: Secondary | ICD-10-CM | POA: Diagnosis not present

## 2017-11-08 ENCOUNTER — Encounter: Payer: Self-pay | Admitting: Internal Medicine

## 2017-11-08 ENCOUNTER — Ambulatory Visit (INDEPENDENT_AMBULATORY_CARE_PROVIDER_SITE_OTHER): Payer: Medicare HMO | Admitting: Internal Medicine

## 2017-11-08 VITALS — BP 150/85 | HR 74 | Ht 67.0 in | Wt 123.0 lb

## 2017-11-08 DIAGNOSIS — I5032 Chronic diastolic (congestive) heart failure: Secondary | ICD-10-CM

## 2017-11-08 DIAGNOSIS — Z950 Presence of cardiac pacemaker: Secondary | ICD-10-CM | POA: Diagnosis not present

## 2017-11-08 DIAGNOSIS — I495 Sick sinus syndrome: Secondary | ICD-10-CM | POA: Diagnosis not present

## 2017-11-08 DIAGNOSIS — I480 Paroxysmal atrial fibrillation: Secondary | ICD-10-CM | POA: Diagnosis not present

## 2017-11-08 LAB — CUP PACEART INCLINIC DEVICE CHECK
Battery Remaining Longevity: 28 mo
Brady Statistic AS VP Percent: 2 %
Brady Statistic AS VS Percent: 15 %
Date Time Interrogation Session: 20191022162612
Implantable Lead Implant Date: 20051130
Implantable Lead Location: 753859
Implantable Pulse Generator Implant Date: 20140901
Lead Channel Impedance Value: 598 Ohm
Lead Channel Pacing Threshold Amplitude: 1.25 V
Lead Channel Pacing Threshold Pulse Width: 0.4 ms
Lead Channel Sensing Intrinsic Amplitude: 11.2 mV
Lead Channel Setting Pacing Amplitude: 2.5 V
Lead Channel Setting Pacing Amplitude: 2.5 V
Lead Channel Setting Pacing Pulse Width: 0.4 ms
Lead Channel Setting Sensing Sensitivity: 4 mV
MDC IDC LEAD IMPLANT DT: 20051130
MDC IDC LEAD LOCATION: 753860
MDC IDC MSMT BATTERY IMPEDANCE: 684 Ohm
MDC IDC MSMT BATTERY VOLTAGE: 2.76 V
MDC IDC MSMT LEADCHNL RA IMPEDANCE VALUE: 565 Ohm
MDC IDC MSMT LEADCHNL RA PACING THRESHOLD AMPLITUDE: 0.5 V
MDC IDC MSMT LEADCHNL RA PACING THRESHOLD PULSEWIDTH: 1 ms
MDC IDC MSMT LEADCHNL RA SENSING INTR AMPL: 0.5 mV
MDC IDC STAT BRADY AP VP PERCENT: 82 %
MDC IDC STAT BRADY AP VS PERCENT: 0 %

## 2017-11-08 NOTE — Progress Notes (Signed)
Patient Care Team: Kirk Ruths, MD as PCP - General (Unknown Physician Specialty)   HPI  Alejandra Wiley is a 84 y.o. female seen in followup for palpitations associated with atrial arrhythmias as well as bradycardia requiring backup bradycardia pacing. She takes flecainide   Her husband is no longer at home.    Hospitalized for kyphoplasty.  Complicated by atrial tachycardia with pacing at upper rate limit   Echocardiogram 2103 demonstrated normal left ventricular function  Date Cr Hgb  8/18 0.67 12.2  9/18 0.64 10   Hospitalized 8/19 with syncope, thought prob orthostatic with objective evidence variable, ( one day no change, the next day 30 + HR increase.)   Also preceded by atrial fib which might have contributed to assoc weakness and fatigue and excessive rates assoc with walking>> rate response reprogrammed   Flecainide was increased   Pacemaker interrogation >> atrial failure to capture and device reprogrammed; also leading to uninterpretable data re Atrial arrhythmia \ DATE PR interval QRSduration Dose  6/13 AR 268 112 50  6/19 AR 308  VP 138 75  9/19 -- -- 100   She feels like her situation is continuing to deteriorate.  She has no paranoid of falling.   She is having therapy and home but feels like her conditions are worse now compared to last week.  This is concurrent with efforts at diuresis which were associated with decreased edema is somewhat improved shortness of breath   She is not sleeping well.  She complains of significant fatigue and lassitude.    She is not eating well but is taking Ensure.  Albumin 9/19 was 3.4.    Past Medical History:  Diagnosis Date  . Bradycardia   . Cancer ([place])    vaginal intraepithelial neoplasia  . CHF (congestive heart failure) ([place])   . Hypertension   . Pacemaker 12/06/2003   Medtronic Impulse DTDR01  . Pacemaker 2015  . PAF (paroxysmal atrial fibrillation) (H[facility])    a. on Coumadin and flecainide; b.  CHADS2VASc 3 (HTN, age x 2, female)  . Sick sinus syndrome ([place])   . Sinoatrial node dysfunction (HCC)   . Vaginal intraepithelial neoplasia     Past Surgical History:  Procedure Laterality Date  . APPENDECTOMY    . HERNIA REPAIR     lower abd  . IMAGE GUIDED SINUS SURGERY  2015  . INSERT / REPLACE / REMOVE PACEMAKER  12/06/2003   Medtronic impulse DTDR01  . JOINT REPLACEMENT Right    knee  . KYPHOPLASTY N/A 06/23/2017   Procedure: [id number];  Surgeon: Hessie Knows, MD;  Location: [facility] ORS;  Service: Orthopedics;  Laterality: N/A;  . LEEP  1999  . ORIF HIP FRACTURE Right 02/2016  . PACEMAKER GENERATOR CHANGE N/A 09/18/2012   Procedure: PACEMAKER GENERATOR CHANGE;  Surgeon: Deboraha Sprang, MD;  Location: [facility] CATH LAB;  Service: Cardiovascular;  Laterality: N/A;  . TONSILLECTOMY    . TOTAL KNEE ARTHROPLASTY    . TOTAL KNEE REVISION Right 09/23/2016   Procedure: TOTAL KNEE REVISION-POLYETHYLENE EXCHANGE;  Surgeon: Hessie Knows, MD;  Location: [facility] ORS;  Service: Orthopedics;  Laterality: Right;  Marland Kitchen VAGINAL HYSTERECTOMY      Current Outpatient Medications  Medication Sig Dispense Refill  . acetaminophen (TYLENOL) 500 MG tablet Take 500 mg by mouth every 8 (eight) hours as needed for mild pain or headache.    . diphenhydrAMINE (BENADRYL) 25 mg capsule Take 25 mg by mouth at bedtime  as needed for sleep. Takes along with Tylenol     . flecainide (TAMBOCOR) 100 MG tablet Take 1 tablet (100 mg total) by mouth 2 (two) times daily. 60 tablet 0  . fluticasone (FLONASE) 50 MCG/ACT nasal spray Place 2 sprays into both nostrils daily.    . furosemide (LASIX) 40 MG tablet Take 1 tablet (40 mg total) by mouth daily. 20 tablet 3  . ibandronate (BONIVA) 150 MG tablet Take 150 mg by mouth every 30 (thirty) days. Take in the morning with a full glass of water, on an empty stomach, and do not take anything else by mouth or lie down for the next 30 min.    . Multiple Vitamin (MULTI VITAMIN DAILY PO)  Take 1 tablet by mouth daily. Centrum silver    . nadolol (CORGARD) 20 MG tablet Take 1 tablet (20 mg total) by mouth 2 (two) times daily. 60 tablet 0  . OVER THE COUNTER MEDICATION Place 1 spray into the nose daily. NeilMed Sinus Rinse    . warfarin (COUMADIN) 1 MG tablet Take 1 mg by mouth one time only at 6 PM.      No current facility-administered medications for this visit.     Allergies  Allergen Reactions  . Latex Other (See Comments)    blisters  . Other Other (See Comments)    Blisters/ paper tape is Market researcher paper tape is Market researcher paper tape is Ok  Camera operator is Ok  . Tape Other (See Comments)    Blisters/ paper tape is Market researcher paper tape is Ok  . Xarelto [Rivaroxaban] Other (See Comments)    Gums bleeding and too much bruising  . Nickel Rash and Other (See Comments)    Review of Systems negative except from HPI and PMH  Physical Exam BP (!) 150/85 (BP Location: Left Arm, Patient Position: Sitting, Cuff Size: Normal)   Pulse 74   Ht 5\' 7"  (1.702 m)   Wt 123 lb (55.8 kg)   BMI 19.26 kg/m  Well developed and nourished in no acute distress HENT normal Neck supple with JVP-8/9 Clear Regular rate and rhythm, no murmurs or gallops Abd-soft with active BS No Clubbing cyanosis tr edema Skin-warm and dry A & Oriented  Grossly normal sensory and motor function   ECG   AV pacing with some competitive sinus rhythm with intrinsic conduction and junctional competition    Assessment and  Plan \ Atrial arrhythmia/Fib  Sinus node dysfunction  Hypertension  Syncope  Pacemaker  Medtronic    Elevated pacing thresholds A Greater    HFpEF  Orthostatic hypotension    Her volume status is better.  I am loath to push her diuretics given the fact that there use was associated with worsening functional status.  She has not taken the last 24-48 hours.  We will check her electrolytes.  Last magnesium was 1.9  Last TSH was also elevated.  This  is a significant change; made in the context of her being sick.  We will check a free T3 and free T4; she is pale.  We will check her CBC.  I will discuss with Dr. Ouida Sills the role for melatonin for sleep  More than 50% of 40 min was spent in counseling related to the above

## 2017-11-08 NOTE — Patient Instructions (Addendum)
Medication Instructions:  - Your physician recommends that you continue on your current medications as directed. Please refer to the Current Medication list given to you today.  If you need a refill on your cardiac medications before your next appointment, please call your pharmacy.   Lab work: - Your physician recommends that you have lab work today: CMET/ CBC/ Free T3/ Free T4/ Magnesium  If you have labs (blood work) drawn today and your tests are completely normal, you will receive your results only by: Marland Kitchen MyChart Message (if you have MyChart) OR . A paper copy in the mail If you have any lab test that is abnormal or we need to change your treatment, we will call you to review the results.  Testing/Procedures: - none ordered  Follow-Up: At [facility], you and your health needs are our priority.  As part of our continuing mission to provide you with exceptional heart care, we have created designated Provider Care Teams.  These Care Teams include your primary Cardiologist (physician) and Advanced Practice Providers (APPs -  Physician Assistants and Nurse Practitioners) who all work together to provide you with the care you need, when you need it. . 12 weeks with Dr. Caryl Comes  Any Other Special Instructions Will Be Listed Below (If Applicable). - N/A

## 2017-11-09 LAB — COMPREHENSIVE METABOLIC PANEL
ALK PHOS: 98 IU/L (ref 39–117)
ALT: 23 IU/L (ref 0–32)
AST: 18 IU/L (ref 0–40)
Albumin/Globulin Ratio: 1.1 — ABNORMAL LOW (ref 1.2–2.2)
Albumin: 3.3 g/dL — ABNORMAL LOW (ref 3.5–4.7)
BUN/Creatinine Ratio: 16 (ref 12–28)
BUN: 11 mg/dL (ref 8–27)
Bilirubin Total: 0.5 mg/dL (ref 0.0–1.2)
CALCIUM: 9.3 mg/dL (ref 8.7–10.3)
CO2: 28 mmol/L (ref 20–29)
CREATININE: 0.69 mg/dL (ref 0.57–1.00)
Chloride: 95 mmol/L — ABNORMAL LOW (ref 96–106)
GFR calc Af Amer: 94 mL/min/{1.73_m2} (ref >59)
GFR, EST NON AFRICAN AMERICAN: 81 mL/min/{1.73_m2} (ref >59)
GLOBULIN, TOTAL: 3 g/dL (ref 1.5–4.5)
GLUCOSE: 105 mg/dL — AB (ref 65–99)
Potassium: 3.4 mmol/L — ABNORMAL LOW (ref 3.5–5.2)
SODIUM: 138 mmol/L (ref 134–144)
Total Protein: 6.3 g/dL (ref 6.0–8.5)

## 2017-11-09 LAB — CBC WITH DIFFERENTIAL/PLATELET
BASOS ABS: 0 10*3/uL (ref 0.0–0.2)
Basos: 1 %
EOS (ABSOLUTE): 0.5 10*3/uL — AB (ref 0.0–0.4)
Eos: 6 %
HEMOGLOBIN: 12.3 g/dL (ref 11.1–15.9)
Hematocrit: 37.9 % (ref 34.0–46.6)
IMMATURE GRANULOCYTES: 2 %
Immature Grans (Abs): 0.1 10*3/uL (ref 0.0–0.1)
LYMPHS: 22 %
Lymphocytes Absolute: 2 10*3/uL (ref 0.7–3.1)
MCH: 28.7 pg (ref 26.6–33.0)
MCHC: 32.5 g/dL (ref 31.5–35.7)
MCV: 89 fL (ref 79–97)
Monocytes Absolute: 0.6 10*3/uL (ref 0.1–0.9)
Monocytes: 6 %
NEUTROS PCT: 63 %
Neutrophils Absolute: 5.6 10*3/uL (ref 1.4–7.0)
PLATELETS: 268 10*3/uL (ref 150–450)
RBC: 4.28 x10E6/uL (ref 3.77–5.28)
RDW: 14.4 % (ref 12.3–15.4)
WBC: 8.9 10*3/uL (ref 3.4–10.8)

## 2017-11-09 LAB — T3, FREE: T3, Free: 2.5 pg/mL (ref 2.0–4.4)

## 2017-11-09 LAB — MAGNESIUM: Magnesium: 2 mg/dL (ref 1.6–2.3)

## 2017-11-09 LAB — T4, FREE: FREE T4: 1.16 ng/dL (ref 0.82–1.77)

## 2017-11-10 ENCOUNTER — Telehealth: Payer: Self-pay | Admitting: Internal Medicine

## 2017-11-10 DIAGNOSIS — R2689 Other abnormalities of gait and mobility: Secondary | ICD-10-CM | POA: Diagnosis not present

## 2017-11-10 DIAGNOSIS — I5032 Chronic diastolic (congestive) heart failure: Secondary | ICD-10-CM | POA: Diagnosis not present

## 2017-11-10 DIAGNOSIS — Z950 Presence of cardiac pacemaker: Secondary | ICD-10-CM | POA: Diagnosis not present

## 2017-11-10 DIAGNOSIS — M6281 Muscle weakness (generalized): Secondary | ICD-10-CM | POA: Diagnosis not present

## 2017-11-10 DIAGNOSIS — I10 Essential (primary) hypertension: Secondary | ICD-10-CM | POA: Diagnosis not present

## 2017-11-10 DIAGNOSIS — I480 Paroxysmal atrial fibrillation: Secondary | ICD-10-CM | POA: Diagnosis not present

## 2017-11-10 DIAGNOSIS — E43 Unspecified severe protein-calorie malnutrition: Secondary | ICD-10-CM | POA: Diagnosis not present

## 2017-11-10 NOTE — Telephone Encounter (Signed)
Notes recorded by Deboraha Sprang, MD on 11/10/2017 at 8:22 AM EDT Please Inform Patient that labs are normal x mild hypokalemia  Lets change her diuretic to aldactone 25 qd and check BMET in 2 week ss Thanks

## 2017-11-10 NOTE — Telephone Encounter (Signed)
I spoke with the patient regarding her lab results from Dr. Caryl Comes. I have advised her of Dr. Olin Pia recommendations to stop lasix and start aldactone 25 mg once daily.  Per the patient, she only took the lasix once daily x 2 days and felt it was too strong for her. Weights are continuing to decrease:  Fri 10/18- 125 lbs Sat 10/19- 126.5 lbs (1st dose of lasix) Sun 10/20- 123 lbs (2nd dose of lasix) Mon 10/21- 123 lbs Tues 10/22- 121.5 lbs Wed 10/23- 120.5 lbs Today 10/24- 120 lbs  I inquired from the patient what she thought her baseline weight to be and she advised 128-130 lbs. She feels her breathing is ok. She denies taking any potassium supplements.  I advised the patient I will review this information with Dr. Caryl Comes prior to making her medication change, since she is not taking lasix daily as documented in her chart.   She states she has had a busy day and will be napping this afternoon. I asked if it would be better for me to call her in the morning and she advised she would like a call after 10 am.

## 2017-11-10 NOTE — Telephone Encounter (Signed)
I left a message for the patient to call regarding her most recent lab results from 11/08/17.

## 2017-11-11 MED ORDER — SPIRONOLACTONE 25 MG PO TABS: ORAL_TABLET | ORAL | 3 refills | Status: DC

## 2017-11-11 NOTE — Telephone Encounter (Signed)
Discussed the information below with Dr. Caryl Comes. Orders received to take spironolactone 25 mg once daily as needed for increased SOB / weight gain instead of lasix.  I have notified the patient of the above and she is agreeable with these recommendations.  RX to be sent to [facility] on [address]   Reva Bores routed to Dr. Ouida Sills.

## 2017-11-14 DIAGNOSIS — I5032 Chronic diastolic (congestive) heart failure: Secondary | ICD-10-CM | POA: Diagnosis not present

## 2017-11-14 DIAGNOSIS — I480 Paroxysmal atrial fibrillation: Secondary | ICD-10-CM | POA: Diagnosis not present

## 2017-11-14 DIAGNOSIS — Z950 Presence of cardiac pacemaker: Secondary | ICD-10-CM | POA: Diagnosis not present

## 2017-11-14 DIAGNOSIS — R2689 Other abnormalities of gait and mobility: Secondary | ICD-10-CM | POA: Diagnosis not present

## 2017-11-14 DIAGNOSIS — M6281 Muscle weakness (generalized): Secondary | ICD-10-CM | POA: Diagnosis not present

## 2017-11-14 DIAGNOSIS — I10 Essential (primary) hypertension: Secondary | ICD-10-CM | POA: Diagnosis not present

## 2017-11-14 DIAGNOSIS — E43 Unspecified severe protein-calorie malnutrition: Secondary | ICD-10-CM | POA: Diagnosis not present

## 2017-11-15 DIAGNOSIS — E43 Unspecified severe protein-calorie malnutrition: Secondary | ICD-10-CM | POA: Diagnosis not present

## 2017-11-15 DIAGNOSIS — R2689 Other abnormalities of gait and mobility: Secondary | ICD-10-CM | POA: Diagnosis not present

## 2017-11-15 DIAGNOSIS — I5032 Chronic diastolic (congestive) heart failure: Secondary | ICD-10-CM | POA: Diagnosis not present

## 2017-11-15 DIAGNOSIS — I480 Paroxysmal atrial fibrillation: Secondary | ICD-10-CM | POA: Diagnosis not present

## 2017-11-15 DIAGNOSIS — I10 Essential (primary) hypertension: Secondary | ICD-10-CM | POA: Diagnosis not present

## 2017-11-15 DIAGNOSIS — Z950 Presence of cardiac pacemaker: Secondary | ICD-10-CM | POA: Diagnosis not present

## 2017-11-15 DIAGNOSIS — M6281 Muscle weakness (generalized): Secondary | ICD-10-CM | POA: Diagnosis not present

## 2017-11-16 DIAGNOSIS — M6281 Muscle weakness (generalized): Secondary | ICD-10-CM | POA: Diagnosis not present

## 2017-11-16 DIAGNOSIS — I480 Paroxysmal atrial fibrillation: Secondary | ICD-10-CM | POA: Diagnosis not present

## 2017-11-16 DIAGNOSIS — I10 Essential (primary) hypertension: Secondary | ICD-10-CM | POA: Diagnosis not present

## 2017-11-16 DIAGNOSIS — R2689 Other abnormalities of gait and mobility: Secondary | ICD-10-CM | POA: Diagnosis not present

## 2017-11-16 DIAGNOSIS — E43 Unspecified severe protein-calorie malnutrition: Secondary | ICD-10-CM | POA: Diagnosis not present

## 2017-11-16 DIAGNOSIS — Z950 Presence of cardiac pacemaker: Secondary | ICD-10-CM | POA: Diagnosis not present

## 2017-11-16 DIAGNOSIS — I5032 Chronic diastolic (congestive) heart failure: Secondary | ICD-10-CM | POA: Diagnosis not present

## 2017-11-18 DIAGNOSIS — R2689 Other abnormalities of gait and mobility: Secondary | ICD-10-CM | POA: Diagnosis not present

## 2017-11-18 DIAGNOSIS — Z950 Presence of cardiac pacemaker: Secondary | ICD-10-CM | POA: Diagnosis not present

## 2017-11-18 DIAGNOSIS — I10 Essential (primary) hypertension: Secondary | ICD-10-CM | POA: Diagnosis not present

## 2017-11-18 DIAGNOSIS — M6281 Muscle weakness (generalized): Secondary | ICD-10-CM | POA: Diagnosis not present

## 2017-11-18 DIAGNOSIS — I480 Paroxysmal atrial fibrillation: Secondary | ICD-10-CM | POA: Diagnosis not present

## 2017-11-18 DIAGNOSIS — I5032 Chronic diastolic (congestive) heart failure: Secondary | ICD-10-CM | POA: Diagnosis not present

## 2017-11-18 DIAGNOSIS — E43 Unspecified severe protein-calorie malnutrition: Secondary | ICD-10-CM | POA: Diagnosis not present

## 2017-11-19 ENCOUNTER — Other Ambulatory Visit: Payer: Self-pay | Admitting: Internal Medicine

## 2017-11-21 DIAGNOSIS — R2689 Other abnormalities of gait and mobility: Secondary | ICD-10-CM | POA: Diagnosis not present

## 2017-11-21 DIAGNOSIS — I10 Essential (primary) hypertension: Secondary | ICD-10-CM | POA: Diagnosis not present

## 2017-11-21 DIAGNOSIS — M6281 Muscle weakness (generalized): Secondary | ICD-10-CM | POA: Diagnosis not present

## 2017-11-21 DIAGNOSIS — I5032 Chronic diastolic (congestive) heart failure: Secondary | ICD-10-CM | POA: Diagnosis not present

## 2017-11-21 DIAGNOSIS — I480 Paroxysmal atrial fibrillation: Secondary | ICD-10-CM | POA: Diagnosis not present

## 2017-11-21 DIAGNOSIS — E43 Unspecified severe protein-calorie malnutrition: Secondary | ICD-10-CM | POA: Diagnosis not present

## 2017-11-21 DIAGNOSIS — Z950 Presence of cardiac pacemaker: Secondary | ICD-10-CM | POA: Diagnosis not present

## 2017-11-22 DIAGNOSIS — Z950 Presence of cardiac pacemaker: Secondary | ICD-10-CM | POA: Diagnosis not present

## 2017-11-22 DIAGNOSIS — R2689 Other abnormalities of gait and mobility: Secondary | ICD-10-CM | POA: Diagnosis not present

## 2017-11-22 DIAGNOSIS — E43 Unspecified severe protein-calorie malnutrition: Secondary | ICD-10-CM | POA: Diagnosis not present

## 2017-11-22 DIAGNOSIS — I5032 Chronic diastolic (congestive) heart failure: Secondary | ICD-10-CM | POA: Diagnosis not present

## 2017-11-22 DIAGNOSIS — I480 Paroxysmal atrial fibrillation: Secondary | ICD-10-CM | POA: Diagnosis not present

## 2017-11-22 DIAGNOSIS — M6281 Muscle weakness (generalized): Secondary | ICD-10-CM | POA: Diagnosis not present

## 2017-11-22 DIAGNOSIS — I10 Essential (primary) hypertension: Secondary | ICD-10-CM | POA: Diagnosis not present

## 2017-11-22 MED ORDER — FLECAINIDE ACETATE 100 MG PO TABS: 100.0000 mg | ORAL_TABLET | Freq: Two times a day (BID) | ORAL | 3 refills | Status: DC

## 2017-11-24 DIAGNOSIS — M6281 Muscle weakness (generalized): Secondary | ICD-10-CM | POA: Diagnosis not present

## 2017-11-24 DIAGNOSIS — Z950 Presence of cardiac pacemaker: Secondary | ICD-10-CM | POA: Diagnosis not present

## 2017-11-24 DIAGNOSIS — R2689 Other abnormalities of gait and mobility: Secondary | ICD-10-CM | POA: Diagnosis not present

## 2017-11-24 DIAGNOSIS — E43 Unspecified severe protein-calorie malnutrition: Secondary | ICD-10-CM | POA: Diagnosis not present

## 2017-11-24 DIAGNOSIS — I10 Essential (primary) hypertension: Secondary | ICD-10-CM | POA: Diagnosis not present

## 2017-11-24 DIAGNOSIS — I5032 Chronic diastolic (congestive) heart failure: Secondary | ICD-10-CM | POA: Diagnosis not present

## 2017-11-24 DIAGNOSIS — I480 Paroxysmal atrial fibrillation: Secondary | ICD-10-CM | POA: Diagnosis not present

## 2017-11-25 DIAGNOSIS — R2689 Other abnormalities of gait and mobility: Secondary | ICD-10-CM | POA: Diagnosis not present

## 2017-11-25 DIAGNOSIS — E43 Unspecified severe protein-calorie malnutrition: Secondary | ICD-10-CM | POA: Diagnosis not present

## 2017-11-25 DIAGNOSIS — I5032 Chronic diastolic (congestive) heart failure: Secondary | ICD-10-CM | POA: Diagnosis not present

## 2017-11-25 DIAGNOSIS — I480 Paroxysmal atrial fibrillation: Secondary | ICD-10-CM | POA: Diagnosis not present

## 2017-11-25 DIAGNOSIS — M6281 Muscle weakness (generalized): Secondary | ICD-10-CM | POA: Diagnosis not present

## 2017-11-25 DIAGNOSIS — Z950 Presence of cardiac pacemaker: Secondary | ICD-10-CM | POA: Diagnosis not present

## 2017-11-25 DIAGNOSIS — I10 Essential (primary) hypertension: Secondary | ICD-10-CM | POA: Diagnosis not present

## 2017-11-28 ENCOUNTER — Other Ambulatory Visit: Payer: Self-pay

## 2017-11-28 ENCOUNTER — Inpatient Hospital Stay
Admission: EM | Admit: 2017-11-28 | Discharge: 2017-12-01 | DRG: 689 | Disposition: A | Discharge status: Skilled Nursing Facility | Payer: Medicare HMO | Source: Skilled Nursing Facility | Attending: Internal Medicine | Admitting: Internal Medicine

## 2017-11-28 ENCOUNTER — Encounter: Payer: Self-pay | Admitting: *Deleted

## 2017-11-28 DIAGNOSIS — R531 Weakness: Secondary | ICD-10-CM

## 2017-11-28 DIAGNOSIS — Z96651 Presence of right artificial knee joint: Secondary | ICD-10-CM | POA: Diagnosis present

## 2017-11-28 DIAGNOSIS — E871 Hypo-osmolality and hyponatremia: Secondary | ICD-10-CM

## 2017-11-28 DIAGNOSIS — I110 Hypertensive heart disease with heart failure: Secondary | ICD-10-CM | POA: Diagnosis present

## 2017-11-28 DIAGNOSIS — Z66 Do not resuscitate: Secondary | ICD-10-CM | POA: Diagnosis present

## 2017-11-28 DIAGNOSIS — Z7951 Long term (current) use of inhaled steroids: Secondary | ICD-10-CM

## 2017-11-28 DIAGNOSIS — E878 Other disorders of electrolyte and fluid balance, not elsewhere classified: Secondary | ICD-10-CM | POA: Diagnosis not present

## 2017-11-28 DIAGNOSIS — Z833 Family history of diabetes mellitus: Secondary | ICD-10-CM | POA: Diagnosis not present

## 2017-11-28 DIAGNOSIS — Z8249 Family history of ischemic heart disease and other diseases of the circulatory system: Secondary | ICD-10-CM

## 2017-11-28 DIAGNOSIS — I480 Paroxysmal atrial fibrillation: Secondary | ICD-10-CM | POA: Diagnosis not present

## 2017-11-28 DIAGNOSIS — Z23 Encounter for immunization: Secondary | ICD-10-CM

## 2017-11-28 DIAGNOSIS — B9620 Unspecified Escherichia coli [E. coli] as the cause of diseases classified elsewhere: Secondary | ICD-10-CM | POA: Diagnosis present

## 2017-11-28 DIAGNOSIS — N390 Urinary tract infection, site not specified: Secondary | ICD-10-CM | POA: Diagnosis not present

## 2017-11-28 DIAGNOSIS — I5033 Acute on chronic diastolic (congestive) heart failure: Secondary | ICD-10-CM | POA: Diagnosis present

## 2017-11-28 DIAGNOSIS — F329 Major depressive disorder, single episode, unspecified: Secondary | ICD-10-CM | POA: Diagnosis present

## 2017-11-28 DIAGNOSIS — Z950 Presence of cardiac pacemaker: Secondary | ICD-10-CM

## 2017-11-28 DIAGNOSIS — Z7901 Long term (current) use of anticoagulants: Secondary | ICD-10-CM | POA: Diagnosis not present

## 2017-11-28 DIAGNOSIS — Z79899 Other long term (current) drug therapy: Secondary | ICD-10-CM | POA: Diagnosis not present

## 2017-11-28 DIAGNOSIS — I495 Sick sinus syndrome: Secondary | ICD-10-CM | POA: Diagnosis not present

## 2017-11-28 DIAGNOSIS — J811 Chronic pulmonary edema: Secondary | ICD-10-CM | POA: Diagnosis not present

## 2017-11-28 DIAGNOSIS — I509 Heart failure, unspecified: Secondary | ICD-10-CM | POA: Diagnosis not present

## 2017-11-28 DIAGNOSIS — R0902 Hypoxemia: Secondary | ICD-10-CM

## 2017-11-28 LAB — URINALYSIS, COMPLETE (UACMP) WITH MICROSCOPIC
BILIRUBIN URINE: NEGATIVE
Glucose, UA: NEGATIVE mg/dL
KETONES UR: NEGATIVE mg/dL
Nitrite: POSITIVE — AB
PROTEIN: 30 mg/dL — AB
Specific Gravity, Urine: 1.017 (ref 1.005–1.030)
pH: 6 (ref 5.0–8.0)

## 2017-11-28 LAB — COMPREHENSIVE METABOLIC PANEL
ALBUMIN: 3.6 g/dL (ref 3.5–5.0)
ALT: 27 U/L (ref 0–44)
AST: 33 U/L (ref 15–41)
Alkaline Phosphatase: 99 U/L (ref 38–126)
Anion gap: 11 (ref 5–15)
BUN: 20 mg/dL (ref 8–23)
CHLORIDE: 93 mmol/L — AB (ref 98–111)
CO2: 23 mmol/L (ref 22–32)
Calcium: 8.5 mg/dL — ABNORMAL LOW (ref 8.9–10.3)
Creatinine, Ser: 0.71 mg/dL (ref 0.44–1.00)
GFR calc Af Amer: >60 mL/min (ref >60)
GFR calc non Af Amer: >60 mL/min (ref >60)
GLUCOSE: 138 mg/dL — AB (ref 70–99)
POTASSIUM: 4.1 mmol/L (ref 3.5–5.1)
Sodium: 127 mmol/L — ABNORMAL LOW (ref 135–145)
Total Bilirubin: 0.8 mg/dL (ref 0.3–1.2)
Total Protein: 8.1 g/dL (ref 6.5–8.1)

## 2017-11-28 LAB — CBC WITH DIFFERENTIAL/PLATELET
ABS IMMATURE GRANULOCYTES: 0.06 10*3/uL (ref 0.00–0.07)
BASOS ABS: 0.1 10*3/uL (ref 0.0–0.1)
Basophils Relative: 1 %
EOS ABS: 0.2 10*3/uL (ref 0.0–0.5)
Eosinophils Relative: 2 %
HEMATOCRIT: 38.6 % (ref 36.0–46.0)
Hemoglobin: 12.3 g/dL (ref 12.0–15.0)
IMMATURE GRANULOCYTES: 1 %
Lymphocytes Relative: 20 %
Lymphs Abs: 1.5 10*3/uL (ref 0.7–4.0)
MCH: 28.4 pg (ref 26.0–34.0)
MCHC: 31.9 g/dL (ref 30.0–36.0)
MCV: 89.1 fL (ref 80.0–100.0)
MONOS PCT: 8 %
Monocytes Absolute: 0.6 10*3/uL (ref 0.1–1.0)
NEUTROS ABS: 5.1 10*3/uL (ref 1.7–7.7)
Neutrophils Relative %: 68 %
Platelets: 224 10*3/uL (ref 150–400)
RBC: 4.33 MIL/uL (ref 3.87–5.11)
RDW: 16.4 % — AB (ref 11.5–15.5)
WBC: 7.5 10*3/uL (ref 4.0–10.5)
nRBC: 0 % (ref 0.0–0.2)

## 2017-11-28 LAB — TSH: TSH: 4.7 u[IU]/mL — AB (ref 0.350–4.500)

## 2017-11-28 LAB — PROTIME-INR
INR: 1.16
Prothrombin Time: 14.7 seconds (ref 11.4–15.2)

## 2017-11-28 LAB — TROPONIN I: Troponin I: <0.03 ng/mL (ref <0.03)

## 2017-11-28 MED ORDER — ACETAMINOPHEN 325 MG PO TABS
650.0000 mg | ORAL_TABLET | Freq: Four times a day (QID) | ORAL | Status: DC | PRN
  Administered 2017-11-28 – 2017-11-30 (×2): 650 mg via ORAL
  Filled 2017-11-28 (×2): qty 2

## 2017-11-28 MED ORDER — INFLUENZA VAC SPLIT HIGH-DOSE 0.5 ML IM SUSY
0.5000 mL | PREFILLED_SYRINGE | INTRAMUSCULAR | Status: DC
  Filled 2017-11-28: qty 0.5

## 2017-11-28 MED ORDER — SODIUM CHLORIDE 1 G PO TABS
1.0000 g | ORAL_TABLET | Freq: Three times a day (TID) | ORAL | Status: DC
  Administered 2017-11-28 – 2017-11-29 (×3): 1 g via ORAL
  Filled 2017-11-28 (×4): qty 1

## 2017-11-28 MED ORDER — FLUTICASONE PROPIONATE 50 MCG/ACT NA SUSP
2.0000 | Freq: Every day | NASAL | Status: DC | PRN
  Filled 2017-11-28: qty 16

## 2017-11-28 MED ORDER — SODIUM CHLORIDE 0.9 % IV SOLN
250.0000 mL | INTRAVENOUS | Status: DC | PRN
  Administered 2017-11-30: 250 mL via INTRAVENOUS

## 2017-11-28 MED ORDER — ONDANSETRON HCL 4 MG/2ML IJ SOLN
4.0000 mg | Freq: Four times a day (QID) | INTRAMUSCULAR | Status: DC | PRN
  Administered 2017-11-29: 4 mg via INTRAVENOUS
  Filled 2017-11-28: qty 2

## 2017-11-28 MED ORDER — ALBUTEROL SULFATE (2.5 MG/3ML) 0.083% IN NEBU: 2.5000 mg | INHALATION_SOLUTION | RESPIRATORY_TRACT | Status: DC | PRN

## 2017-11-28 MED ORDER — SODIUM CHLORIDE 0.9% FLUSH: 3.0000 mL | INTRAVENOUS | Status: DC | PRN

## 2017-11-28 MED ORDER — DIPHENHYDRAMINE HCL 25 MG PO CAPS
25.0000 mg | ORAL_CAPSULE | Freq: Every evening | ORAL | Status: DC | PRN
  Administered 2017-11-29: 25 mg via ORAL
  Filled 2017-11-28: qty 1

## 2017-11-28 MED ORDER — ACETAMINOPHEN 500 MG PO TABS: 500.0000 mg | ORAL_TABLET | Freq: Three times a day (TID) | ORAL | Status: DC | PRN

## 2017-11-28 MED ORDER — ACETAMINOPHEN 650 MG RE SUPP: 650.0000 mg | Freq: Four times a day (QID) | RECTAL | Status: DC | PRN

## 2017-11-28 MED ORDER — WARFARIN SODIUM 2 MG PO TABS
2.0000 mg | ORAL_TABLET | Freq: Every day | ORAL | Status: DC
  Administered 2017-11-28: 2 mg via ORAL
  Filled 2017-11-28 (×2): qty 1

## 2017-11-28 MED ORDER — ONDANSETRON HCL 4 MG PO TABS: 4.0000 mg | ORAL_TABLET | Freq: Four times a day (QID) | ORAL | Status: DC | PRN

## 2017-11-28 MED ORDER — SODIUM CHLORIDE 0.9 % IV SOLN
1.0000 g | INTRAVENOUS | Status: DC
  Administered 2017-11-29 – 2017-11-30 (×2): 1 g via INTRAVENOUS
  Filled 2017-11-28 (×2): qty 10
  Filled 2017-11-28: qty 1

## 2017-11-28 MED ORDER — SODIUM CHLORIDE 0.9% FLUSH
3.0000 mL | Freq: Two times a day (BID) | INTRAVENOUS | Status: DC
  Administered 2017-11-28 – 2017-11-30 (×5): 3 mL via INTRAVENOUS

## 2017-11-28 MED ORDER — ADULT MULTIVITAMIN W/MINERALS CH: 1.0000 | ORAL_TABLET | Freq: Every day | ORAL | Status: DC

## 2017-11-28 MED ORDER — FLECAINIDE ACETATE 100 MG PO TABS
100.0000 mg | ORAL_TABLET | Freq: Two times a day (BID) | ORAL | Status: DC
  Administered 2017-11-28 – 2017-12-01 (×6): 100 mg via ORAL
  Filled 2017-11-28 (×8): qty 1

## 2017-11-28 MED ORDER — ADULT MULTIVITAMIN W/MINERALS CH
ORAL_TABLET | Freq: Every day | ORAL | Status: DC
  Administered 2017-11-29 – 2017-12-01 (×3): 1 via ORAL
  Filled 2017-11-28 (×2): qty 1

## 2017-11-28 MED ORDER — SODIUM CHLORIDE 0.9 % IV SOLN
Freq: Once | INTRAVENOUS | Status: AC
  Administered 2017-11-28: 15:00:00 via INTRAVENOUS

## 2017-11-28 MED ORDER — NADOLOL 20 MG PO TABS
10.0000 mg | ORAL_TABLET | Freq: Every day | ORAL | Status: DC
  Administered 2017-11-29 – 2017-11-30 (×2): 10 mg via ORAL
  Filled 2017-11-28 (×2): qty 1

## 2017-11-28 MED ORDER — POLYETHYLENE GLYCOL 3350 17 G PO PACK
17.0000 g | PACK | Freq: Every day | ORAL | Status: DC | PRN
  Administered 2017-11-30: 17 g via ORAL
  Filled 2017-11-28 (×2): qty 1

## 2017-11-28 MED ORDER — SODIUM CHLORIDE 0.9 % IV SOLN
1.0000 g | Freq: Once | INTRAVENOUS | Status: AC
  Administered 2017-11-28: 1 g via INTRAVENOUS
  Filled 2017-11-28: qty 10

## 2017-11-28 MED ORDER — WARFARIN - PHYSICIAN DOSING INPATIENT: Freq: Every day | Status: DC

## 2017-11-28 NOTE — ED Triage Notes (Signed)
Per [facility], pt was unable to get out of her car at [facility] due to weakness which has been present for 2-3 days.  Staff report low grade fever and question UTI.

## 2017-11-28 NOTE — Progress Notes (Signed)
Family Meeting Note  Advance Directive:yes  Today a meeting took place with the Patient.  Patient is able to participate   The following clinical team members were present during this meeting:MD  The following were discussed:Patient's diagnosis: Sick sinus syndrome, paroxysmal A. fib, heart failure, UTI, Patient's progosis: Unable to determine and Goals for treatment: DNR  Additional follow-up to be provided: prn  Time spent during discussion:20 minutes  Gorden Harms, MD

## 2017-11-28 NOTE — ED Provider Notes (Signed)
[facility] Emergency Department Provider Note ____________________________________________   I have reviewed the triage vital signs and the triage nursing note.  HISTORY  Chief Complaint Weakness   Historian Patient  HPI Alejandra Wiley is a 84 y.o. female who lives alone at [facility], history of paroxysmal atrial fibrillation, has a pacemaker, history of CHF, presents to the emergency department because she has severe generalized weakness.  States that she had routine doctor appointments today and she needed a walker to just attempt to walk.  She typically does not walk with a walker.  States she is had poor p.o. intake for a few days.  Denies abdominal pain.  Denies hematuria or frequency of urination.  Denies any head trauma.  Reports history of CHF and states that her legs are a little more swollen than typical right the ankles.    Past Medical History:  Diagnosis Date  . Bradycardia   . 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 CHF (congestive heart failure) ([place])   . Hypertension   . Pacemaker 12/06/2003   Medtronic Impulse DTDR01  . Pacemaker 2015  . PAF (paroxysmal atrial fibrillation) (HCC)    a. on Coumadin and flecainide; b. CHADS2VASc 3 (HTN, age x 2, female)  . Sick sinus syndrome ([place])   . Sinoatrial node dysfunction (H[facility])   . Vaginal intraepithelial neoplasia     Patient Active Problem List   Diagnosis Date Noted  . Protein calorie malnutrition ([place]) 10/10/2017  . Falls 10/10/2017  . Weakness 10/10/2017  . Muscle atrophy 10/10/2017  . Sinus node dysfunction ([place]) 10/10/2017  . Syncope and collapse 09/12/2017  . Pyelonephritis 08/26/2017  . Chronic diastolic heart failure ([place]) 07/19/2017  . HTN (hypertension) 07/19/2017  . Painful total knee replacement ([place]) 09/23/2016  . 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 ATRIAL FIBRILLATION 06/13/2008  . PACEMAKER-Medtronic 06/13/2008    Past Surgical History:  Procedure  Laterality Date  . APPENDECTOMY    . HERNIA REPAIR     lower abd  . IMAGE GUIDED SINUS SURGERY  2015  . INSERT / REPLACE / REMOVE PACEMAKER  12/06/2003   Medtronic impulse DTDR01  . JOINT REPLACEMENT Right    knee  . KYPHOPLASTY N/A 06/23/2017   Procedure: [id number];  Surgeon: Hessie Knows, MD;  Location: [facility] ORS;  Service: Orthopedics;  Laterality: N/A;  . LEEP  1999  . ORIF HIP FRACTURE Right 02/2016  . PACEMAKER GENERATOR CHANGE N/A 09/18/2012   Procedure: PACEMAKER GENERATOR CHANGE;  Surgeon: Deboraha Sprang, MD;  Location: [facility] CATH LAB;  Service: Cardiovascular;  Laterality: N/A;  . TONSILLECTOMY    . TOTAL KNEE ARTHROPLASTY    . TOTAL KNEE REVISION Right 09/23/2016   Procedure: TOTAL KNEE REVISION-POLYETHYLENE EXCHANGE;  Surgeon: Hessie Knows, MD;  Location: [facility] ORS;  Service: Orthopedics;  Laterality: Right;  Marland Kitchen VAGINAL HYSTERECTOMY      Prior to Admission medications   Medication Sig Start Date End Date Taking? Authorizing Provider  acetaminophen (TYLENOL) 500 MG tablet Take 500 mg by mouth every 8 (eight) hours as needed for mild pain or headache.   Yes [provider]  diphenhydrAMINE (BENADRYL) 25 mg capsule Take 25 mg by mouth at bedtime as needed for sleep. Takes along with Tylenol    Yes [provider]  flecainide (TAMBOCOR) 100 MG tablet Take 1 tablet (100 mg total) by mouth 2 (two) times daily. 11/22/17  Yes Deboraha Sprang, MD  fluticasone Asencion Islam) 50 MCG/ACT nasal spray Place  2 sprays into both nostrils daily as needed for allergies or rhinitis.    Yes [provider]  ibandronate (BONIVA) 150 MG tablet Take 150 mg by mouth every 30 (thirty) days. Take in the morning with a full glass of water, on an empty stomach, and do not take anything else by mouth or lie down for the next 30 min.   Yes [provider]  Multiple Vitamin (MULTI VITAMIN DAILY PO) Take 1 tablet by mouth daily. Centrum silver   Yes [provider]  nadolol  (CORGARD) 20 MG tablet Take 1 tablet (20 mg total) by mouth 2 (two) times daily. Patient taking differently: Take 10 mg by mouth daily.  09/14/17 11/28/17 Yes Pyreddy, Reatha Harps, MD  OVER THE COUNTER MEDICATION Place 1 spray into the nose daily as needed (for nasal congestion). NeilMed Sinus Rinse    Yes [provider]  warfarin (COUMADIN) 1 MG tablet Take 2 mg by mouth at bedtime.    Yes [provider]  spironolactone (ALDACTONE) 25 MG tablet Take 1 tablet (25 mg) by mouth once daily as needed for increase shortness of breath/ weight gain Patient not taking: Reported on 11/28/2017 11/11/17   Deboraha Sprang, MD    Allergies  Allergen Reactions  . Latex Other (See Comments)    blisters  . Other Other (See Comments)    Blisters/ paper tape is Market researcher paper tape is Market researcher paper tape is Ok  Camera operator is Ok  . Tape Other (See Comments)    Blisters/ paper tape is Market researcher paper tape is Ok  . Xarelto [Rivaroxaban] Other (See Comments)    Gums bleeding and too much bruising  . Nickel Rash and Other (See Comments)    Family History  Problem Relation Age of Onset  . Stroke Mother   . Heart disease Father   . Diabetes Daughter   . Diabetes Son     Social History Social History   Tobacco Use  . Smoking status: Never Smoker  . Smokeless tobacco: Never Used  Substance Use Topics  . Alcohol use: Yes    Alcohol/week: 1.0 standard drinks    Types: 1 Glasses of wine per week    Comment: occasionally  . Drug use: No    Review of Systems  Constitutional: Negative for fever. Eyes: Negative for visual changes. ENT: Negative for sore throat. Cardiovascular: Negative for chest pain. Respiratory: Negative for shortness of breath. Gastrointestinal: Negative for abdominal pain, vomiting and diarrhea. Genitourinary: Negative for dysuria. Musculoskeletal: Negative for back pain. Skin: Negative for rash. Neurological: Negative for  headache.  ____________________________________________   PHYSICAL EXAM:  VITAL SIGNS: ED Triage Vitals  Enc Vitals Group     BP 11/28/17 1500 (!) 147/95     Pulse Rate 11/28/17 1445 70     Resp 11/28/17 1445 19     Temp --      Temp src --      SpO2 11/28/17 1445 91 %     Weight 11/28/17 1438 123 lb (55.8 kg)     Height 11/28/17 1438 5\' 7"  (1.702 m)     Head Circumference --      Peak Flow --      Pain Score 11/28/17 1559 0     Pain Loc --      Pain Edu? --      Excl. in [place]? --      Constitutional: Alert and oriented.  HEENT  Head: Normocephalic and atraumatic.      Eyes: Conjunctivae are normal. Pupils equal and round.       Ears:         Nose: No congestion/rhinnorhea.      Mouth/Throat: Mucous membranes are mildly dry.      Neck: No stridor. Cardiovascular/Chest: Normal rate, regular rhythm.  No murmurs, rubs, or gallops.  Respiratory: Normal respiratory effort without tachypnea nor retractions. Breath sounds are clear and equal bilaterally. No wheezes/rales/rhonchi. Gastrointestinal: Soft. No distention, no guarding, no rebound. Nontender.    Genitourinary/rectal:Deferred Musculoskeletal: Nontender with normal range of motion in all extremities. No joint effusions.  No lower extremity tenderness.  No edema. Neurologic:  Normal speech and language. No gross or focal neurologic deficits are appreciated. Skin:  Skin is warm, dry and intact. No rash noted. Psychiatric: Mood and affect are normal. Speech and behavior are normal. Patient exhibits appropriate insight and judgment.   ____________________________________________  LABS (pertinent positives/negatives) I, Lisa Roca, MD the attending physician have reviewed the labs noted below.  Labs Reviewed  CBC WITH DIFFERENTIAL/PLATELET - Abnormal; Notable for the following components:      Result Value   RDW 16.4 (*)    All other components within normal limits  COMPREHENSIVE METABOLIC PANEL - Abnormal;  Notable for the following components:   Sodium 127 (*)    Chloride 93 (*)    Glucose, Bld 138 (*)    Calcium 8.5 (*)    All other components within normal limits  URINALYSIS, COMPLETE (UACMP) WITH MICROSCOPIC - Abnormal; Notable for the following components:   Color, Urine AMBER (*)    APPearance HAZY (*)    Hgb urine dipstick MODERATE (*)    Protein, ur 30 (*)    Nitrite POSITIVE (*)    Leukocytes, UA TRACE (*)    Bacteria, UA MANY (*)    All other components within normal limits  CULTURE, BLOOD (ROUTINE X 2)  CULTURE, BLOOD (ROUTINE X 2)  URINE CULTURE  TROPONIN I  CBG MONITORING, ED    ____________________________________________    EKG I, Lisa Roca, MD, the attending physician have personally viewed and interpreted all ECGs.  71 beats per minute.  Atrially sensed ventricularly paced rhythm ____________________________________________  RADIOLOGY   None __________________________________________  PROCEDURES  Procedure(s) performed: None  Procedures  Critical Care performed: None   ____________________________________________  ED COURSE / ASSESSMENT AND PLAN  Pertinent labs & imaging results that were available during my care of the patient were reviewed by me and considered in my medical decision making (see chart for details).     Patient does seem very weak generally, no focal weakness.  She does live alone.  She looks dehydrated and reports decreased p.o. intake.  I did want to go somewhat slow on IV fluids given history of CHF.  She is not having any fever or hypoxia or respiratory problems currently.  Laboratory studies are overall reassuring.  No elevated white blood cell count I do not think that she is septic, however she is found to have a nitrite positive urinary tract infection with hyponatremia and she did receive IV fluid bolus here as well as blood cultures and urine culture prior to order for Rocephin.  Given generalized weakness,  she is going to need hospital admission for treatment of UTI and possibly evaluation by PT to figure out if she is safe to be at home during this acute illness.   CONSULTATIONS:   Dr. Jerelyn Charles, hospitalist for admission.  Patient / Family / Caregiver informed of clinical course, medical decision-making process, and agree with plan.   ___________________________________________   FINAL CLINICAL IMPRESSION(S) / ED DIAGNOSES   Final diagnoses:  Hyponatremia  Urinary tract infection without hematuria, site unspecified  Generalized weakness      ___________________________________________         Note: This dictation was prepared with Dragon dictation. Any transcriptional errors that result from this process are unintentional    Lisa Roca, MD 11/28/17 1758

## 2017-11-28 NOTE — H&P (Signed)
[place] [facility] [place] NAME: Alejandra Wiley    MR#:  [id number]  DATE OF BIRTH:  26-Sep-1932  DATE OF ADMISSION:  11/28/2017  PRIMARY CARE PHYSICIAN: Kirk Ruths, MD   REQUESTING/REFERRING PHYSICIAN:   CHIEF COMPLAINT:   Chief Complaint  Patient presents with  . Weakness    HISTORY OF PRESENT ILLNESS: Alejandra Wiley  is a 84 y.o. female with a known history per below, presenting from local nursing facility with two 3-day history of generalized weakness, fatigue, fevers, decreased p.o. intake, in the emergency room patient was found to have sodium 127, chloride 93, urinalysis consistent with UTI, patient is now been admitted for acute urinary tract infection, hyponatremia and hypochloremia.  PAST MEDICAL HISTORY:   Past Medical History:  Diagnosis Date  . Bradycardia   . 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 CHF (congestive heart failure) ([place])   . Hypertension   . Pacemaker 12/06/2003   Medtronic Impulse DTDR01  . Pacemaker 2015  . PAF (paroxysmal atrial fibrillation) (HCC)    a. on Coumadin and flecainide; b. CHADS2VASc 3 (HTN, age x 2, female)  . Sick sinus syndrome ([place])   . Sinoatrial node dysfunction (HCC)   . Vaginal intraepithelial neoplasia     PAST SURGICAL HISTORY:  Past Surgical History:  Procedure Laterality Date  . APPENDECTOMY    . HERNIA REPAIR     lower abd  . IMAGE GUIDED SINUS SURGERY  2015  . INSERT / REPLACE / REMOVE PACEMAKER  12/06/2003   Medtronic impulse DTDR01  . JOINT REPLACEMENT Right    knee  . KYPHOPLASTY N/A 06/23/2017   Procedure: [id number];  Surgeon: Hessie Knows, MD;  Location: [facility] ORS;  Service: Orthopedics;  Laterality: N/A;  . LEEP  1999  . ORIF HIP FRACTURE Right 02/2016  . PACEMAKER GENERATOR CHANGE N/A 09/18/2012   Procedure: PACEMAKER GENERATOR CHANGE;  Surgeon: Deboraha Sprang, MD;  Location: [facility] CATH LAB;  Service: Cardiovascular;  Laterality: N/A;  .  TONSILLECTOMY    . TOTAL KNEE ARTHROPLASTY    . TOTAL KNEE REVISION Right 09/23/2016   Procedure: TOTAL KNEE REVISION-POLYETHYLENE EXCHANGE;  Surgeon: Hessie Knows, MD;  Location: [facility] ORS;  Service: Orthopedics;  Laterality: Right;  Marland Kitchen VAGINAL HYSTERECTOMY      SOCIAL HISTORY:  Social History   Tobacco Use  . Smoking status: Never Smoker  . Smokeless tobacco: Never Used  Substance Use Topics  . Alcohol use: Yes    Alcohol/week: 1.0 standard drinks    Types: 1 Glasses of wine per week    Comment: occasionally    FAMILY HISTORY:  Family History  Problem Relation Age of Onset  . Stroke Mother   . Heart disease Father   . Diabetes Daughter   . Diabetes Son     DRUG ALLERGIES:  Allergies  Allergen Reactions  . Latex Other (See Comments)    blisters  . Other Other (See Comments)    Blisters/ paper tape is Market researcher paper tape is Market researcher paper tape is Ok  Camera operator is Ok  . Tape Other (See Comments)    Blisters/ paper tape is Market researcher paper tape is Ok  . Xarelto [Rivaroxaban] Other (See Comments)    Gums bleeding and too much bruising  . Nickel Rash and Other (See Comments)    REVIEW OF SYSTEMS:   CONSTITUTIONAL: +fever, fatigue, weakness.  EYES: No blurred or double vision.  EARS,  NOSE, AND THROAT: No tinnitus or ear pain.  RESPIRATORY: No cough, shortness of breath, wheezing or hemoptysis.  CARDIOVASCULAR: No chest pain, orthopnea, edema.  GASTROINTESTINAL: No nausea, vomiting, diarrhea or abdominal pain.  GENITOURINARY: No dysuria, hematuria.  ENDOCRINE: No polyuria, nocturia,  HEMATOLOGY: No anemia, easy bruising or bleeding SKIN: No rash or lesion. MUSCULOSKELETAL: No joint pain or arthritis.   NEUROLOGIC: No tingling, numbness, weakness.  PSYCHIATRY: No anxiety or depression.   MEDICATIONS AT HOME:  Prior to Admission medications   Medication Sig Start Date End Date Taking? Authorizing Provider  acetaminophen (TYLENOL) 500 MG  tablet Take 500 mg by mouth every 8 (eight) hours as needed for mild pain or headache.   Yes [provider]  diphenhydrAMINE (BENADRYL) 25 mg capsule Take 25 mg by mouth at bedtime as needed for sleep. Takes along with Tylenol    Yes [provider]  flecainide (TAMBOCOR) 100 MG tablet Take 1 tablet (100 mg total) by mouth 2 (two) times daily. 11/22/17  Yes Deboraha Sprang, MD  fluticasone [facility]) 50 MCG/ACT nasal spray Place 2 sprays into both nostrils daily as needed for allergies or rhinitis.    Yes [provider]  ibandronate (BONIVA) 150 MG tablet Take 150 mg by mouth every 30 (thirty) days. Take in the morning with a full glass of water, on an empty stomach, and do not take anything else by mouth or lie down for the next 30 min.   Yes [provider]  Multiple Vitamin (MULTI VITAMIN DAILY PO) Take 1 tablet by mouth daily. Centrum silver   Yes [provider]  nadolol (CORGARD) 20 MG tablet Take 1 tablet (20 mg total) by mouth 2 (two) times daily. Patient taking differently: Take 10 mg by mouth daily.  09/14/17 11/28/17 Yes Pyreddy, Reatha Harps, MD  OVER THE COUNTER MEDICATION Place 1 spray into the nose daily as needed (for nasal congestion). NeilMed Sinus Rinse    Yes [provider]  warfarin (COUMADIN) 1 MG tablet Take 2 mg by mouth at bedtime.    Yes [provider]      PHYSICAL EXAMINATION:   VITAL SIGNS: Blood pressure (!) 158/101, pulse 69, resp. rate 17, height 5\' 7"  (1.702 m), weight 55.8 kg, SpO2 91 %.  GENERAL:  84 y.o.-year-old patient lying in the bed with no acute distress.  Frail-appearing EYES: Pupils equal, round, reactive to light and accommodation. No scleral icterus. Extraocular muscles intact.  HEENT: Head atraumatic, normocephalic. Oropharynx and nasopharynx clear.  NECK:  Supple, no jugular venous distention. No thyroid enlargement, no tenderness.  LUNGS: Normal breath sounds bilaterally, no wheezing,  rales,rhonchi or crepitation. No use of accessory muscles of respiration.  CARDIOVASCULAR: S1, S2 normal. No murmurs, rubs, or gallops.  ABDOMEN: Soft, nontender, nondistended. Bowel sounds present. No organomegaly or mass.  EXTREMITIES: No pedal edema, cyanosis, or clubbing.  NEUROLOGIC: Cranial nerves II through XII are intact. Muscle strength 5/5 in all extremities. Sensation intact. Gait not checked.  PSYCHIATRIC: The patient is alert and oriented x 3.  SKIN: No obvious rash, lesion, or ulcer.   LABORATORY PANEL:   CBC Recent Labs  Lab 11/28/17 1454  WBC 7.5  HGB 12.3  HCT 38.6  PLT 224  MCV 89.1  MCH 28.4  MCHC 31.9  RDW 16.4*  LYMPHSABS 1.5  MONOABS 0.6  EOSABS 0.2  BASOSABS 0.1   ------------------------------------------------------------------------------------------------------------------  Chemistries  Recent Labs  Lab 11/28/17 1454  NA 127*  K 4.1  CL 93*  CO2 23  GLUCOSE 138*  BUN 20  CREATININE 0.71  CALCIUM 8.5*  AST 33  ALT 27  ALKPHOS 99  BILITOT 0.8   ------------------------------------------------------------------------------------------------------------------ estimated creatinine clearance is 47.8 mL/min (by C-G formula based on SCr of 0.71 mg/dL). ------------------------------------------------------------------------------------------------------------------ No results for input(s): TSH, T4TOTAL, T3FREE, THYROIDAB in the last 72 hours.  Invalid input(s): FREET3   Coagulation profile No results for input(s): INR, PROTIME in the last 168 hours. ------------------------------------------------------------------------------------------------------------------- No results for input(s): DDIMER in the last 72 hours. -------------------------------------------------------------------------------------------------------------------  Cardiac Enzymes Recent Labs  Lab 11/28/17 1454  TROPONINI <0.03    ------------------------------------------------------------------------------------------------------------------ Invalid input(s): POCBNP  ---------------------------------------------------------------------------------------------------------------  Urinalysis    Component Value Date/Time   COLORURINE AMBER (A) 11/28/2017 1454   APPEARANCEUR HAZY (A) 11/28/2017 1454   APPEARANCEUR Clear 09/16/2012 2023   LABSPEC 1.017 11/28/2017 1454   LABSPEC 1.016 09/16/2012 2023   PHURINE 6.0 11/28/2017 1454   GLUCOSEU NEGATIVE 11/28/2017 1454   GLUCOSEU Negative 09/16/2012 2023   HGBUR MODERATE (A) 11/28/2017 1454   BILIRUBINUR NEGATIVE 11/28/2017 1454   BILIRUBINUR Negative 09/16/2012 2023   KETONESUR NEGATIVE 11/28/2017 1454   PROTEINUR 30 (A) 11/28/2017 1454   NITRITE POSITIVE (A) 11/28/2017 1454   LEUKOCYTESUR TRACE (A) 11/28/2017 1454   LEUKOCYTESUR 1+ 09/16/2012 2023     RADIOLOGY: No results found.  EKG: Orders placed or performed during the hospital encounter of 11/28/17  . EKG 12-Lead  . EKG 12-Lead  . ED EKG  . ED EKG    IMPRESSION AND PLAN: *Acute urinary tract infection Empiric Rocephin, follow-up on cultures  *Acute hyponatremia/hypochloremia Most likely secondary to poor p.o. Intake Salt tabs 3 times daily, heart healthy diet  *History of sick sinus syndrome Status post pacer placement Continue flecainide  *History of paroxysmal A. Fib S/P pacemaker Continue Coumadin-check INR now and every a.m., on flecainide, C[place]ard  All the records are reviewed and case discussed with ED provider. Management plans discussed with the patient, family and they are in agreement.  CODE STATUS:DNR Code Status History    Date Active Date Inactive Code Status Order ID Comments User Context   09/12/2017 1808 09/16/2017 1815 DNR [id number]  Dustin Flock, MD Inpatient   09/12/2017 1611 09/12/2017 1808 DNR [id number]  Dustin Flock, MD ED   08/26/2017 0521 08/27/2017 1640  Full Code [id number]  Lance Coon, MD Inpatient   06/23/2017 1732 06/24/2017 1701 Full Code [id number]  Gladstone Lighter, MD Inpatient   09/23/2016 1825 09/24/2016 1702 Full Code [id number]  Hessie Knows, MD Inpatient    Questions for Most Recent Historical Code Status (Order [id number])    Question Answer Comment   In the event of cardiac or respiratory ARREST Do not call a "code blue"    In the event of cardiac or respiratory ARREST Do not perform Intubation, CPR, defibrillation or ACLS    In the event of cardiac or respiratory ARREST Use medication by any route, position, wound care, and other measures to relive pain and suffering. May use oxygen, suction and manual treatment of airway obstruction as needed for comfort.         Advance Directive Documentation     Most Recent Value  Type of Advance Directive  Living will  Pre-existing out of facility DNR order (yellow form or pink MOST form)  -  "MOST" Form in Place?  -       TOTAL TIME TAKING CARE OF THIS PATIENT: 40 minutes.    Alejandra Wiley M.D on 11/28/2017  Between 7am to 6pm - Pager - [phone]  After 6pm go to [email or web address] - password [email or web address] [place] [facility]  Office  [phone]  CC: Primary care physician; Kirk Ruths, MD   Note: This dictation was prepared with Dragon dictation along with smaller phrase technology. Any transcriptional errors that result from this process are unintentional.

## 2017-11-29 ENCOUNTER — Inpatient Hospital Stay: Payer: Medicare HMO

## 2017-11-29 LAB — BASIC METABOLIC PANEL
Anion gap: 11 (ref 5–15)
BUN: 22 mg/dL (ref 8–23)
CALCIUM: 8.4 mg/dL — AB (ref 8.9–10.3)
CO2: 20 mmol/L — ABNORMAL LOW (ref 22–32)
CREATININE: 0.72 mg/dL (ref 0.44–1.00)
Chloride: 102 mmol/L (ref 98–111)
GFR calc Af Amer: >60 mL/min (ref >60)
Glucose, Bld: 173 mg/dL — ABNORMAL HIGH (ref 70–99)
POTASSIUM: 4.3 mmol/L (ref 3.5–5.1)
SODIUM: 133 mmol/L — AB (ref 135–145)

## 2017-11-29 LAB — PROTIME-INR
INR: 1.45
PROTHROMBIN TIME: 17.5 s — AB (ref 11.4–15.2)

## 2017-11-29 LAB — GLUCOSE, CAPILLARY: Glucose-Capillary: 139 mg/dL — ABNORMAL HIGH (ref 70–99)

## 2017-11-29 LAB — MRSA PCR SCREENING: MRSA by PCR: NEGATIVE

## 2017-11-29 MED ORDER — PANTOPRAZOLE SODIUM 40 MG PO TBEC
40.0000 mg | DELAYED_RELEASE_TABLET | Freq: Two times a day (BID) | ORAL | Status: DC
  Administered 2017-11-29 – 2017-12-01 (×4): 40 mg via ORAL
  Filled 2017-11-29 (×5): qty 1

## 2017-11-29 MED ORDER — FUROSEMIDE 10 MG/ML IJ SOLN
40.0000 mg | Freq: Two times a day (BID) | INTRAMUSCULAR | Status: AC
  Administered 2017-11-29 (×2): 40 mg via INTRAVENOUS
  Filled 2017-11-29 (×2): qty 4

## 2017-11-29 MED ORDER — WARFARIN SODIUM 2.5 MG PO TABS
2.5000 mg | ORAL_TABLET | Freq: Once | ORAL | Status: AC
  Administered 2017-11-29: 2.5 mg via ORAL
  Filled 2017-11-29: qty 1

## 2017-11-29 MED ORDER — INFLUENZA VAC SPLIT HIGH-DOSE 0.5 ML IM SUSY
0.5000 mL | PREFILLED_SYRINGE | INTRAMUSCULAR | Status: AC
  Administered 2017-11-30: 0.5 mL via INTRAMUSCULAR
  Filled 2017-11-29: qty 0.5

## 2017-11-29 NOTE — Progress Notes (Signed)
[facility] NAME: Alejandra Wiley    MR#:  [id number]  DATE OF BIRTH:  [birth year]  SUBJECTIVE:  CHIEF COMPLAINT:   Chief Complaint  Patient presents with  . Weakness   Feels weak Chronic SOB Afebrile  Has had intermittent vomiting for many weeks.  No abdominal pain or constipation.  REVIEW OF SYSTEMS:    Review of Systems  Constitutional: Positive for malaise/fatigue. Negative for chills and fever.  HENT: Negative for sore throat.   Eyes: Negative for blurred vision, double vision and pain.  Respiratory: Positive for shortness of breath. Negative for cough, hemoptysis and wheezing.   Cardiovascular: Negative for chest pain, palpitations, orthopnea and leg swelling.  Gastrointestinal: Positive for vomiting. Negative for abdominal pain, constipation, diarrhea, heartburn and nausea.  Genitourinary: Negative for dysuria and hematuria.  Musculoskeletal: Negative for back pain and joint pain.  Skin: Negative for rash.  Neurological: Negative for sensory change, speech change, focal weakness and headaches.  Endo/Heme/Allergies: Does not bruise/bleed easily.  Psychiatric/Behavioral: Negative for depression. The patient is not nervous/anxious.     DRUG ALLERGIES:   Allergies  Allergen Reactions  . Latex Other (See Comments)    blisters  . Other Other (See Comments)    Blisters/ paper tape is Market researcher paper tape is Market researcher paper tape is Ok  Camera operator is Ok  . Tape Other (See Comments)    Blisters/ paper tape is Market researcher paper tape is Ok  . Xarelto [Rivaroxaban] Other (See Comments)    Gums bleeding and too much bruising  . Nickel Rash and Other (See Comments)    VITALS:  Blood pressure (!) 141/96, pulse 76, temperature 97.6 F (36.4 C), temperature source Oral, resp. rate 16, height 5\' 7"  (1.702 m), weight 58.3 kg, SpO2 97 %.  PHYSICAL EXAMINATION:   Physical Exam  GENERAL:  84 y.o.-year-old  patient lying in the bed with no acute distress.  EYES: Pupils equal, round, reactive to light and accommodation. No scleral icterus. Extraocular muscles intact.  HEENT: Head atraumatic, normocephalic. Oropharynx and nasopharynx clear.  NECK:  Supple, no jugular venous distention. No thyroid enlargement, no tenderness.  LUNGS: Normal breath sounds bilaterally, no wheezing, rales, rhonchi. No use of accessory muscles of respiration.  CARDIOVASCULAR: S1, S2 normal. No murmurs, rubs, or gallops.  ABDOMEN: Soft, nontender, nondistended. Bowel sounds present. No organomegaly or mass.  EXTREMITIES: No cyanosis, clubbing or edema b/l.    NEUROLOGIC: Cranial nerves II through XII are intact. No focal Motor or sensory deficits b/l.   PSYCHIATRIC: The patient is alert and oriented x 3.  SKIN: No obvious rash, lesion, or ulcer.   LABORATORY PANEL:   CBC Recent Labs  Lab 11/28/17 1454  WBC 7.5  HGB 12.3  HCT 38.6  PLT 224   ------------------------------------------------------------------------------------------------------------------ Chemistries  Recent Labs  Lab 11/28/17 1454 11/29/17 0448  NA 127* 133*  K 4.1 4.3  CL 93* 102  CO2 23 20*  GLUCOSE 138* 173*  BUN 20 22  CREATININE 0.71 0.72  CALCIUM 8.5* 8.4*  AST 33  --   ALT 27  --   ALKPHOS 99  --   BILITOT 0.8  --    ------------------------------------------------------------------------------------------------------------------  Cardiac Enzymes Recent Labs  Lab 11/28/17 1454  TROPONINI <0.03   ------------------------------------------------------------------------------------------------------------------  RADIOLOGY:  Dg Chest 2 View  Result Date: 11/29/2017 CLINICAL DATA:  Hypoxia.  History of CHF, atrial fibrillation EXAM: CHEST - 2 VIEW COMPARISON:  Chest x-ray and chest CT scan of September 12, 2017 FINDINGS: The lungs are less well inflated today. There are bilateral pleural effusions greatest on the left. There  is bibasilar atelectasis. The cardiac silhouette is enlarged. The pulmonary vascularity is prominent centrally with mild cephalization. The ICD is in stable position. IMPRESSION: CHF with mild interstitial edema. Bibasilar atelectasis with bilateral pleural effusions greatest on the left. Electronically Signed   By: David  Martinique M.D.   On: 11/29/2017 10:43     ASSESSMENT AND PLAN:   *UTI.  On IV ceftriaxone.  Waiting on cultures.  *Acute on chronic diastolic congestive heart failure.  Will start IV Lasix 40 twice daily.  Not on Lasix at home.  Monitor input and output.  Replace potassium as needed  *Hypervolemic hyponatremia is improving.  *Hypertension, uncontrolled.  Home medication restarted.  Will see if Lasix and diuresis would help.  *Intermittent chronic vomiting.  No abdominal tenderness.  No constipation.  Could be gastritis.  Will treat with PPIs and nausea medication.  *DVT prophylaxis with Lovenox  All the records are reviewed and case discussed with Care Management/Social Workerr. Management plans discussed with the patient, family and they are in agreement.  CODE STATUS: DO NOT RESUSCITATE  TOTAL TIME TAKING CARE OF THIS PATIENT: 82 minutes.   POSSIBLE D/C IN 1-2 DAYS, DEPENDING ON CLINICAL CONDITION.  Neita Carp M.D on 11/29/2017 at 12:28 PM  Between 7am to 6pm - Pager - [phone]  After 6pm go to [email or web address] - password [email or web address] [facility]  Office  [phone]  CC: Primary care physician; Kirk Ruths, MD  Note: This dictation was prepared with Dragon dictation along with smaller phrase technology. Any transcriptional errors that result from this process are unintentional.

## 2017-11-29 NOTE — Consult Note (Addendum)
ANTICOAGULATION CONSULT NOTE - Initial Consult  Pharmacy Consult for warfarin Indication: atrial fibrillation  Allergies  Allergen Reactions  . Latex Other (See Comments)    blisters  . Other Other (See Comments)    Blisters/ paper tape is Market researcher paper tape is Market researcher paper tape is Ok  Camera operator is Ok  . Tape Other (See Comments)    Blisters/ paper tape is Market researcher paper tape is Ok  . Xarelto [Rivaroxaban] Other (See Comments)    Gums bleeding and too much bruising  . Nickel Rash and Other (See Comments)    Patient Measurements: Height: 5\' 7"  (170.2 cm) Weight: 128 lb 9.6 oz (58.3 kg) IBW/kg (Calculated) : 61.6   Vital Signs: Temp: 97.6 F (36.4 C) (11/12 0824) Temp Source: Oral (11/12 0824) BP: 141/96 (11/12 1157) Pulse Rate: 76 (11/12 1157)  Labs: Recent Labs    11/28/17 1454 11/28/17 2035 11/29/17 0448  HGB 12.3  --   --   HCT 38.6  --   --   PLT 224  --   --   LABPROT  --  14.7 17.5*  INR  --  1.16 1.45  CREATININE 0.71  --  0.72  TROPONINI <0.03  --   --     Estimated Creatinine Clearance: 49.9 mL/min (by C-G formula based on SCr of 0.72 mg/dL).   Medical History: Past Medical History:  Diagnosis Date  . Bradycardia   . 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 CHF (congestive heart failure) ([place])   . Hypertension   . Pacemaker 12/06/2003   Medtronic Impulse [id number]  . Pacemaker 2015  . PAF (paroxysmal atrial fibrillation) ([facility])    a. on Coumadin and flecainide; b. CHADS2VASc 3 (HTN, age x 2, female)  . Sick sinus syndrome ([place])   . Sinoatrial node dysfunction ([facility])   . Vaginal intraepithelial neoplasia     Medications:  11/11 AM INR 1.16 11/11 PM Warfarin 2mg        11/12 AM INR 1.45   Goal of Therapy:  INR 2-3 Monitor platelets by anticoagulation protocol: Yes   Plan:  11/12 PM Warfarin 2.5mg  Will monitor INR 11/13 am and CBC q 3days per protocol    Lu Duffel, PharmD Clinical  Pharmacist 11/29/2017 1:42 PM

## 2017-11-29 NOTE — Evaluation (Signed)
Physical Therapy Evaluation Patient Details Name: Alejandra Wiley MRN: [id number] DOB: 1932/04/10 Today's Date: 11/29/2017   History of Present Illness  Patient is an 84 year old female admitted for UTI following c/o weakness.  PMH includes SA node dysfunction, PAF, pacemaker, Htn, CHF, CA and bradycardia.  Clinical Impression  Pt is an 84 year old female who lives in [facility] independent living alone.  She reports being independent without use of AD at baseline.  Pt reported fatigue to weakness due to having been ill and vomiting for the greater part of today.  She agreed to attempt to transfer to chair to eat her lunch and required mod A to get to EOB.  Pt able to sit at EOB with good sitting balance once assisted.  She presented with weakness of UE's and fair strength of LE's.  She was asymptomatic for hypotension and able to transfer to chair with bed elevated and mod A to stand from bedside.  PT assisted pt in positioning for comfort.  Pt will continue to benefit from skilled PT to focus on strength, tolerance to activity and safe functional mobility.     Follow Up Recommendations SNF    Equipment Recommendations  None recommended by PT    Recommendations for Other Services       Precautions / Restrictions Precautions Precautions: Fall Restrictions Weight Bearing Restrictions: No      Mobility  Bed Mobility Overal bed mobility: Needs Assistance Bed Mobility: Supine to Sit     Supine to sit: Mod assist     General bed mobility comments: Hand held assist to get to EOB after which pt was able to sit upright independently.  Transfers Overall transfer level: Needs assistance Equipment used: Rolling walker (2 wheeled) Transfers: Stand Pivot Transfers   Stand pivot transfers: Min guard       General transfer comment: Pt able to rise slowly from bed and transfer wtih use of RW.    Ambulation/Gait                Stairs            Wheelchair Mobility     Modified Rankin (Stroke Patients Only)       Balance Overall balance assessment: Needs assistance Sitting-balance support: Bilateral upper extremity supported;Feet supported Sitting balance-Leahy Scale: Good     Standing balance support: Bilateral upper extremity supported Standing balance-Leahy Scale: Fair                               Pertinent Vitals/Pain Pain Assessment: No/denies pain    Home Living Family/patient expects to be discharged to:: Skilled nursing facility([facility].) Living Arrangements: Alone   Type of Home: House Home Access: Level entry     Home Layout: One level Home Equipment: Walker - 2 wheels      Prior Function Level of Independence: Independent         Comments: Pt has a RW which she does not use.     Hand Dominance        Extremity/Trunk Assessment   Upper Extremity Assessment Upper Extremity Assessment: Generalized weakness    Lower Extremity Assessment Lower Extremity Assessment: Overall WFL for tasks assessed    Cervical / Trunk Assessment Cervical / Trunk Assessment: Normal  Communication   Communication: No difficulties  Cognition Arousal/Alertness: Awake/alert Behavior During Therapy: WFL for tasks assessed/performed;Restless Overall Cognitive Status: Within Functional Limits for tasks  assessed                                 General Comments: Pt is not lethargic but appeared to have very low energy levels, reporting that she has been nauseated and vomiting all day.  Able to follow commands very consistently.      General Comments      Exercises     Assessment/Plan    PT Assessment Patient needs continued PT services  PT Problem List Decreased strength;Decreased mobility;Decreased balance;Decreased knowledge of use of DME;Decreased activity tolerance       PT Treatment Interventions DME instruction;Therapeutic activities;Gait training;Therapeutic  exercise;Patient/family education;Functional mobility training;Balance training    PT Goals (Current goals can be found in the Care Plan section)  Acute Rehab PT Goals Patient Stated Goal: To participate with rehab and regain strength. PT Goal Formulation: With patient Time For Goal Achievement: 12/13/17 Potential to Achieve Goals: Good    Frequency Min 2X/week   Barriers to discharge Decreased caregiver support      Co-evaluation               AM-PAC PT "6 Clicks" Daily Activity  Outcome Measure Difficulty turning over in bed (including adjusting bedclothes, sheets and blankets)?: A Little Difficulty moving from lying on back to sitting on the side of the bed? : A Lot Difficulty sitting down on and standing up from a chair with arms (e.g., wheelchair, bedside commode, etc,.)?: A Little Help needed moving to and from a bed to chair (including a wheelchair)?: A Little Help needed walking in hospital room?: A Lot Help needed climbing 3-5 steps with a railing? : A Lot 6 Click Score: 15    End of Session Equipment Utilized During Treatment: Gait belt Activity Tolerance: Patient tolerated treatment well Patient left: in chair;with chair alarm set;with call bell/phone within reach Nurse Communication: Mobility status PT Visit Diagnosis: Unsteadiness on feet (R26.81);Muscle weakness (generalized) (M62.81)    Time: 5631-4970 PT Time Calculation (min) (ACUTE ONLY): 12 min   Charges:   PT Evaluation $PT Eval Low Complexity: 1 Low         Roxanne Gates, PT, DPT   Roxanne Gates 11/29/2017, 2:20 PM

## 2017-11-29 NOTE — Progress Notes (Signed)
PT Cancellation Note  Patient Details Name: Alejandra Wiley MRN: [id number] DOB: 04/15/1932   Cancelled Treatment:    Reason Eval/Treat Not Completed: Medical issues which prohibited therapy.  Order received.  Chart reviewed.  RN in room with pt checking her BP.  Stated that pt is "not feeling well" this morning and probably won't be able to participate with therapy until later in the day.  Will re-attempt later when appropriate.   Roxanne Gates, PT, DPT 11/29/2017, 8:28 AM

## 2017-11-30 LAB — PROTIME-INR
INR: 1.73
PROTHROMBIN TIME: 20 s — AB (ref 11.4–15.2)

## 2017-11-30 LAB — BASIC METABOLIC PANEL
Anion gap: 11 (ref 5–15)
BUN: 23 mg/dL (ref 8–23)
CALCIUM: 8.4 mg/dL — AB (ref 8.9–10.3)
CO2: 25 mmol/L (ref 22–32)
Chloride: 99 mmol/L (ref 98–111)
Creatinine, Ser: 0.82 mg/dL (ref 0.44–1.00)
GFR calc Af Amer: >60 mL/min (ref >60)
GLUCOSE: 115 mg/dL — AB (ref 70–99)
Potassium: 3.7 mmol/L (ref 3.5–5.1)
SODIUM: 135 mmol/L (ref 135–145)

## 2017-11-30 MED ORDER — WARFARIN - PHARMACIST DOSING INPATIENT
Freq: Every day | Status: DC
  Administered 2017-11-30: 17:00:00

## 2017-11-30 MED ORDER — FUROSEMIDE 10 MG/ML IJ SOLN
40.0000 mg | Freq: Two times a day (BID) | INTRAMUSCULAR | Status: AC
  Administered 2017-11-30 (×2): 40 mg via INTRAVENOUS
  Filled 2017-11-30 (×2): qty 4

## 2017-11-30 MED ORDER — NADOLOL 40 MG PO TABS
40.0000 mg | ORAL_TABLET | Freq: Every day | ORAL | Status: DC
  Administered 2017-12-01: 40 mg via ORAL
  Filled 2017-11-30: qty 1

## 2017-11-30 MED ORDER — WARFARIN SODIUM 2.5 MG PO TABS
2.5000 mg | ORAL_TABLET | Freq: Once | ORAL | Status: AC
  Administered 2017-11-30: 2.5 mg via ORAL
  Filled 2017-11-30: qty 1

## 2017-11-30 NOTE — NC FL2 (Signed)
[place] LEVEL OF CARE SCREENING TOOL     IDENTIFICATION  Patient Name: Alejandra Wiley Birthdate: [birth year] Sex: female Admission Date (Current Location): 11/28/2017  [facility] and [place] Number:  Engineering geologist and Address:  [facility], [address] [place], [place] [address]      Provider Number: [id number]  Attending Physician Name and Address:  Hillary Bow, MD  Relative Name and Phone Number:       Current Level of Care: Hospital Recommended Level of Care: [place] Prior Approval Number:    Date Approved/Denied:   PASRR Number:    Discharge Plan: SNF    Current Diagnoses: Patient Active Problem List   Diagnosis Date Noted  . UTI (urinary tract infection) 11/28/2017  . Protein calorie malnutrition ([place]) 10/10/2017  . Falls 10/10/2017  . Weakness 10/10/2017  . Muscle atrophy 10/10/2017  . Sinus node dysfunction ([place]) 10/10/2017  . Syncope and collapse 09/12/2017  . Pyelonephritis 08/26/2017  . Chronic diastolic heart failure ([place]) 07/19/2017  . HTN (hypertension) 07/19/2017  . Painful total knee replacement ([place]) 09/23/2016  . 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 ATRIAL FIBRILLATION 06/13/2008  . PACEMAKER-Medtronic 06/13/2008    Orientation RESPIRATION BLADDER Height & Weight     Self, Time, Situation, Place  Normal Continent Weight: 129 lb 12.8 oz (58.9 kg) Height:  5\' 7"  (170.2 cm)  BEHAVIORAL SYMPTOMS/MOOD NEUROLOGICAL BOWEL NUTRITION STATUS  (none) (none) Continent    AMBULATORY STATUS COMMUNICATION OF NEEDS Skin   Extensive Assist Verbally Normal                       Personal Care Assistance Level of Assistance  Bathing, Feeding, Dressing Bathing Assistance: Maximum assistance Feeding assistance: Maximum assistance Dressing Assistance: Maximum assistance     Functional Limitations Info  (no issues)          SPECIAL CARE FACTORS FREQUENCY  PT (By licensed PT)                     Contractures Contractures Info: Not present    Additional Factors Info  Code Status Code Status Info: dnr             Current Medications (11/30/2017):  This is the current hospital active medication list Current Facility-Administered Medications  Medication Dose Route Frequency Provider Last Rate Last Dose  . 0.9 %  sodium chloride infusion  250 mL Intravenous PRN Salary, Montell D, MD      . acetaminophen (TYLENOL) tablet 650 mg  650 mg Oral Q6H PRN Salary, Holly Bodily D, MD   650 mg at 11/28/17 2124   Or  . acetaminophen (TYLENOL) suppository 650 mg  650 mg Rectal Q6H PRN Salary, Montell D, MD      . albuterol (PROVENTIL) (2.5 MG/3ML) 0.083% nebulizer solution 2.5 mg  2.5 mg Nebulization Q4H PRN Salary, Montell D, MD      . cefTRIAXone (ROCEPHIN) 1 g in sodium chloride 0.9 % 100 mL IVPB  1 g Intravenous Q24H Salary, Avel Peace, MD   Stopped at 11/29/17 1920  . diphenhydrAMINE (BENADRYL) capsule 25 mg  25 mg Oral QHS PRN Salary, Holly Bodily D, MD   25 mg at 11/29/17 0113  . flecainide (TAMBOCOR) tablet 100 mg  100 mg Oral BID Salary, Montell D, MD   100 mg at 11/30/17 1013  . fluticasone (FLONASE) 50 MCG/ACT nasal spray 2 spray  2 spray Each Nare Daily PRN Salary,  Montell D, MD      . furosemide (LASIX) injection 40 mg  40 mg Intravenous BID Hillary Bow, MD   40 mg at 11/30/17 1014  . multivitamin with minerals tablet   Oral Daily Salary, Montell D, MD   1 tablet at 11/30/17 1012  . [START ON 12/01/2017] nadolol (CORGARD) tablet 40 mg  40 mg Oral Daily Sudini, Srikar, MD      . ondansetron (ZOFRAN) tablet 4 mg  4 mg Oral Q6H PRN Salary, Montell D, MD       Or  . ondansetron (ZOFRAN) injection 4 mg  4 mg Intravenous Q6H PRN Salary, Montell D, MD   4 mg at 11/29/17 0842  . pantoprazole (PROTONIX) EC tablet 40 mg  40 mg Oral BID AC Sudini, Alveta Heimlich, MD   40 mg at 11/30/17 1020  . polyethylene glycol (MIRALAX / GLYCOLAX) packet 17 g  17 g Oral Daily PRN Salary, Montell D, MD    17 g at 11/30/17 1037  . sodium chloride flush (NS) 0.9 % injection 3 mL  3 mL Intravenous Q12H Salary, Montell D, MD   3 mL at 11/30/17 1026  . sodium chloride flush (NS) 0.9 % injection 3 mL  3 mL Intravenous PRN Salary, Montell D, MD      . warfarin (COUMADIN) tablet 2.5 mg  2.5 mg Oral ONCE-1800 Shanlever, Pierce Crane, [facility]      . Warfarin - Pharmacist Dosing Inpatient   Does not apply [place], [place], [facility]         Discharge Medications: Please see discharge summary for a list of discharge medications.  Relevant Imaging Results:  Relevant Lab Results:   Additional Information ss: [id number]  Shela Leff, LCSW

## 2017-11-30 NOTE — Clinical Social Work Note (Signed)
Patient has informed staff she wishes to go to [facility] for rehab at discharge. Patient resides at [facility] independent. CSW has spoken to Seth Bake at [facility] and they can take patient when time for discharge and can take before Josem Kaufmann is received. They will obtain prior auth. Full csw assessment to follow. Shela Leff MSW,LcSW [phone]

## 2017-11-30 NOTE — Consult Note (Signed)
ANTICOAGULATION CONSULT NOTE - Initial Consult  Pharmacy Consult for warfarin Indication: atrial fibrillation  Allergies  Allergen Reactions  . Latex Other (See Comments)    blisters  . Other Other (See Comments)    Blisters/ paper tape is Market researcher paper tape is Market researcher paper tape is Ok  Camera operator is Ok  . Tape Other (See Comments)    Blisters/ paper tape is Market researcher paper tape is Ok  . Xarelto [Rivaroxaban] Other (See Comments)    Gums bleeding and too much bruising  . Nickel Rash and Other (See Comments)    Patient Measurements: Height: 5\' 7"  (170.2 cm) Weight: 129 lb 12.8 oz (58.9 kg) IBW/kg (Calculated) : 61.6   Vital Signs: Temp: 98.6 F (37 C) (11/13 0413) Temp Source: Oral (11/12 2038) BP: 157/88 (11/13 0413) Pulse Rate: 74 (11/13 0413)  Labs: Recent Labs    11/28/17 1454 11/28/17 2035 11/29/17 0448 11/30/17 0443  HGB 12.3  --   --   --   HCT 38.6  --   --   --   PLT 224  --   --   --   LABPROT  --  14.7 17.5* 20.0*  INR  --  1.16 1.45 1.73  CREATININE 0.71  --  0.72 0.82  TROPONINI <0.03  --   --   --     Estimated Creatinine Clearance: 49.2 mL/min (by C-G formula based on SCr of 0.82 mg/dL).   Medical History: Past Medical History:  Diagnosis Date  . Bradycardia   . 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 CHF (congestive heart failure) ([place])   . Hypertension   . Pacemaker 12/06/2003   Medtronic Impulse DTDR01  . Pacemaker 2015  . PAF (paroxysmal atrial fibrillation) ([facility])    a. on Coumadin and flecainide; b. CHADS2VASc 3 (HTN, age x 2, female)  . Sick sinus syndrome ([place])   . Sinoatrial node dysfunction (HCC)   . Vaginal intraepithelial neoplasia     Medications:  11/11 AM INR 1.16 11/11 PM Warfarin 2mg        11/12 AM INR 1.45 11/12 PM Warfarin 2.5mg   11/13 AM INR 1.73  Goal of Therapy:  INR 2-3 Monitor platelets by anticoagulation protocol: Yes   Plan:  11/13 PM Warfarin 2.5mg  Will  monitor INR 11/14 am and CBC q 3days per protocol    Lu Duffel, PharmD Clinical Pharmacist 11/30/2017 7:06 AM

## 2017-11-30 NOTE — Progress Notes (Signed)
[place] at [place] NAME: Alejandra Wiley    MR#:  [id number]  DATE OF BIRTH:  [birth year]  SUBJECTIVE:  CHIEF COMPLAINT:   Chief Complaint  Patient presents with  . Weakness   No vomiting since yesterday SOB is better  REVIEW OF SYSTEMS:    Review of Systems  Constitutional: Positive for malaise/fatigue. Negative for chills and fever.  HENT: Negative for sore throat.   Eyes: Negative for blurred vision, double vision and pain.  Respiratory: Positive for shortness of breath. Negative for cough, hemoptysis and wheezing.   Cardiovascular: Negative for chest pain, palpitations, orthopnea and leg swelling.  Gastrointestinal: Positive for vomiting. Negative for abdominal pain, constipation, diarrhea, heartburn and nausea.  Genitourinary: Negative for dysuria and hematuria.  Musculoskeletal: Negative for back pain and joint pain.  Skin: Negative for rash.  Neurological: Negative for sensory change, speech change, focal weakness and headaches.  Endo/Heme/Allergies: Does not bruise/bleed easily.  Psychiatric/Behavioral: Negative for depression. The patient is not nervous/anxious.     DRUG ALLERGIES:   Allergies  Allergen Reactions  . Latex Other (See Comments)    blisters  . Other Other (See Comments)    Blisters/ paper tape is Market researcher paper tape is Market researcher paper tape is Ok  Camera operator is Ok  . Tape Other (See Comments)    Blisters/ paper tape is Market researcher paper tape is Ok  . Xarelto [Rivaroxaban] Other (See Comments)    Gums bleeding and too much bruising  . Nickel Rash and Other (See Comments)    VITALS:  Blood pressure (!) 146/96, pulse 73, temperature 97.6 F (36.4 C), temperature source Oral, resp. rate 18, height 5\' 7"  (1.702 m), weight 58.9 kg, SpO2 99 %.  PHYSICAL EXAMINATION:   Physical Exam  GENERAL:  84 y.o.-year-old patient lying in the bed with no acute distress.  EYES: Pupils equal,  round, reactive to light and accommodation. No scleral icterus. Extraocular muscles intact.  HEENT: Head atraumatic, normocephalic. Oropharynx and nasopharynx clear.  NECK:  Supple, no jugular venous distention. No thyroid enlargement, no tenderness.  LUNGS: Normal breath sounds bilaterally, no wheezing, rales, rhonchi. No use of accessory muscles of respiration.  CARDIOVASCULAR: S1, S2 normal. No murmurs, rubs, or gallops.  ABDOMEN: Soft, nontender, nondistended. Bowel sounds present. No organomegaly or mass.  EXTREMITIES: No cyanosis, clubbing or edema b/l.    NEUROLOGIC: Cranial nerves II through XII are intact. No focal Motor or sensory deficits b/l.   PSYCHIATRIC: The patient is alert and oriented x 3.  SKIN: No obvious rash, lesion, or ulcer.   LABORATORY PANEL:   CBC Recent Labs  Lab 11/28/17 1454  WBC 7.5  HGB 12.3  HCT 38.6  PLT 224   ------------------------------------------------------------------------------------------------------------------ Chemistries  Recent Labs  Lab 11/28/17 1454  11/30/17 0443  NA 127*   < > 135  K 4.1   < > 3.7  CL 93*   < > 99  CO2 23   < > 25  GLUCOSE 138*   < > 115*  BUN 20   < > 23  CREATININE 0.71   < > 0.82  CALCIUM 8.5*   < > 8.4*  AST 33  --   --   ALT 27  --   --   ALKPHOS 99  --   --   BILITOT 0.8  --   --    < > = values in this interval not displayed.   ------------------------------------------------------------------------------------------------------------------  Cardiac Enzymes Recent Labs  Lab 11/28/17 1454  TROPONINI <0.03   ------------------------------------------------------------------------------------------------------------------  RADIOLOGY:  Dg Chest 2 View  Result Date: 11/29/2017 CLINICAL DATA:  Hypoxia.  History of CHF, atrial fibrillation EXAM: CHEST - 2 VIEW COMPARISON:  Chest x-ray and chest CT scan of September 12, 2017 FINDINGS: The lungs are less well inflated today. There are bilateral  pleural effusions greatest on the left. There is bibasilar atelectasis. The cardiac silhouette is enlarged. The pulmonary vascularity is prominent centrally with mild cephalization. The ICD is in stable position. IMPRESSION: CHF with mild interstitial edema. Bibasilar atelectasis with bilateral pleural effusions greatest on the left. Electronically Signed   By: David  Martinique M.D.   On: 11/29/2017 10:43     ASSESSMENT AND PLAN:   *UTI.  On IV ceftriaxone.  Waiting on cultures.  *Acute on chronic diastolic congestive heart failure.  Started on lasix 40mg  IV BID Improving Still needs inpatient diuresis Likely changed to oral Lasix tomorrow and discharge home 89% on room air today  *Hypervolemic hyponatremia is improving.  *Hypertension, uncontrolled.  Home medication restarted.  Will see if Lasix and diuresis would help.  *Intermittent chronic vomiting.  No abdominal tenderness. No constipation.  Could be gastritis. Will treat with PPIs and nausea medication.  *DVT prophylaxis with Lovenox  All the records are reviewed and case discussed with Care Management/Social Worker Management plans discussed with the patient, family and they are in agreement.  CODE STATUS: DO NOT RESUSCITATE  TOTAL TIME TAKING CARE OF THIS PATIENT: 35 minutes.   POSSIBLE D/C IN 1-2 DAYS, DEPENDING ON CLINICAL CONDITION.  Neita Carp M.D on 11/30/2017 at 12:42 PM  Between 7am to 6pm - Pager - [phone]  After 6pm go to [email or web address] - password [email or web address] [facility]  Office  [phone]  CC: Primary care physician; Kirk Ruths, MD  Note: This dictation was prepared with Dragon dictation along with smaller phrase technology. Any transcriptional errors that result from this process are unintentional.

## 2017-12-01 DIAGNOSIS — Z23 Encounter for immunization: Secondary | ICD-10-CM | POA: Diagnosis not present

## 2017-12-01 LAB — URINE CULTURE: Culture: >=100000 — AB

## 2017-12-01 LAB — PROTIME-INR
INR: 1.6
PROTHROMBIN TIME: 18.9 s — AB (ref 11.4–15.2)

## 2017-12-01 MED ORDER — NADOLOL 40 MG PO TABS: 40.0000 mg | ORAL_TABLET | Freq: Two times a day (BID) | ORAL | 0 refills | Status: DC

## 2017-12-01 MED ORDER — CIPROFLOXACIN HCL 500 MG PO TABS: 500.0000 mg | ORAL_TABLET | Freq: Two times a day (BID) | ORAL | 0 refills | Status: AC

## 2017-12-01 MED ORDER — FUROSEMIDE 40 MG PO TABS: 40.0000 mg | ORAL_TABLET | Freq: Every day | ORAL | 0 refills | Status: DC

## 2017-12-01 MED ORDER — WARFARIN SODIUM 3 MG PO TABS
3.0000 mg | ORAL_TABLET | Freq: Once | ORAL | Status: DC
  Filled 2017-12-01: qty 1

## 2017-12-01 MED ORDER — POTASSIUM CHLORIDE ER 20 MEQ PO TBCR: 10.0000 meq | EXTENDED_RELEASE_TABLET | Freq: Every day | ORAL | 0 refills | Status: DC

## 2017-12-01 MED ORDER — PANTOPRAZOLE SODIUM 40 MG PO TBEC: 40.0000 mg | DELAYED_RELEASE_TABLET | Freq: Every day | ORAL | 0 refills | Status: DC

## 2017-12-01 MED ORDER — MIRTAZAPINE 15 MG PO TBDP: 7.5000 mg | ORAL_TABLET | Freq: Every day | ORAL | 0 refills | Status: DC

## 2017-12-01 NOTE — Progress Notes (Signed)
Discharge order received. Patient is alert and oriented. Vital signs stable . No signs of acute distress. Discharge instructions given. Patient verbalized understanding. No other issues noted at this time.   

## 2017-12-01 NOTE — Discharge Instructions (Signed)
°-   Daily fluids < 2 liters. °- Low salt diet °- Check weight everyday and keep log. Take to your doctors appt. °- Take extra dose of lasix if you gain more than 3 pounds weight. ° ° °

## 2017-12-01 NOTE — Care Management Note (Signed)
Case Management Note  Patient Details  Name: Alejandra Wiley MRN: [id number] Date of Birth: [birth year]   Patient returned to [facility] independent today with home health services through [facility].  Joelene Millin with [facility] notified of discharge.   Subjective/Objective:                    Action/Plan:   Expected Discharge Date:  12/01/17               Expected Discharge Plan:  [name]  In-House Referral:     Discharge planning Services  CM Consult  Post Acute Care Choice:  Home Health Choice offered to:  Patient  DME Arranged:    DME Agency:     HH Arranged:  RN, PT HH Agency:  [facility]  Status of Service:  Completed, signed off  If discussed at [place] of Stay Meetings, dates discussed:    Additional Comments:  Beverly Sessions, RN 12/01/2017, 3:44 PM

## 2017-12-01 NOTE — Clinical Social Work Note (Signed)
Patient has changed her mind and now is going to return to her independent living apartment at [facility]. Seth Bake at [facility] is aware and RN CM to set up home health. Shela Leff MSW,LCSW [phone]

## 2017-12-01 NOTE — Discharge Summary (Addendum)
[place] at [place] NAME: Alejandra Wiley    MR#:  [id number]  DATE OF BIRTH:  [birth year]  DATE OF ADMISSION:  11/28/2017 ADMITTING PHYSICIAN: Gorden Harms, MD  DATE OF DISCHARGE: 12/01/2017  PRIMARY CARE PHYSICIAN: Kirk Ruths, MD   ADMISSION DIAGNOSIS:  Hyponatremia [E87.1] Generalized weakness [R53.1] Urinary tract infection without hematuria, site unspecified [N39.0]  DISCHARGE DIAGNOSIS:  Active Problems:   UTI (urinary tract infection)   SECONDARY DIAGNOSIS:   Past Medical History:  Diagnosis Date  . Bradycardia   . 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 CHF (congestive heart failure) ([place])   . Hypertension   . Pacemaker 12/06/2003   Medtronic Impulse [id number]  . Pacemaker 2015  . PAF (paroxysmal atrial fibrillation) ([facility])    a. on Coumadin and flecainide; b. CHADS2VASc 3 (HTN, age x 2, female)  . Sick sinus syndrome ([place])   . Sinoatrial node dysfunction (H[facility])   . Vaginal intraepithelial neoplasia      ADMITTING HISTORY  HISTORY OF PRESENT ILLNESS: Alejandra Wiley  is a 84 y.o. female with a known history per below, presenting from local nursing facility with two 3-day history of generalized weakness, fatigue, fevers, decreased p.o. intake, in the emergency room patient was found to have sodium 127, chloride 93, urinalysis consistent with UTI, patient is now been admitted for acute urinary tract infection, hyponatremia and hypochloremia.  HOSPITAL COURSE:   *E coli UTI.  On IV ceftriaxone in the hospital and will change to cipro PO for 3 days after discharge  *Acute on chronic diastolic congestive heart failure.  Started on lasix 40mg  IV BID Euvolemic now Will change to Lasix 40 mg PO daily and Kcl 20 meq daily. CHF clinic f/u  *Hypervolemic hyponatremia is improving.  *Hypertension, uncontrolled.  Nadolol dose increased  *Intermittent chronic vomiting.  No abdominal tenderness. No  constipation.  Could be gastritis. Started protonix and no further vomiting  * Depression, mild Remeron started  *DVT prophylaxis with Lovenox  CONSULTS OBTAINED:   PT  DRUG ALLERGIES:   Allergies  Allergen Reactions  . Latex Other (See Comments)    blisters  . Other Other (See Comments)    Blisters/ paper tape is Market researcher paper tape is Market researcher paper tape is Ok  Camera operator is Ok  . Tape Other (See Comments)    Blisters/ paper tape is Market researcher paper tape is Ok  . Xarelto [Rivaroxaban] Other (See Comments)    Gums bleeding and too much bruising  . Nickel Rash and Other (See Comments)    DISCHARGE MEDICATIONS:   Allergies as of 12/01/2017      Reactions   Latex Other (See Comments)   blisters   Other Other (See Comments)   Blisters/ paper tape is Market researcher paper tape is Market researcher paper tape is Market researcher paper tape is Ok   Adult nurse (See Comments)   Blisters/ paper tape is Market researcher paper tape is Ok   Xarelto [rivaroxaban] Other (See Comments)   Gums bleeding and too much bruising   Nickel Rash, Other (See Comments)      Medication List    TAKE these medications   acetaminophen 500 MG tablet Commonly known as:  TYLENOL Take 500 mg by mouth every 8 (eight) hours as needed for mild pain or headache.   ciprofloxacin 500 MG tablet Commonly known as:  CIPRO Take 1 tablet (500 mg total) by  mouth 2 (two) times daily for 3 days.   diphenhydrAMINE 25 mg capsule Commonly known as:  BENADRYL Take 25 mg by mouth at bedtime as needed for sleep. Takes along with Tylenol   flecainide 100 MG tablet Commonly known as:  TAMBOCOR Take 1 tablet (100 mg total) by mouth 2 (two) times daily.   fluticasone 50 MCG/ACT nasal spray Commonly known as:  FLONASE Place 2 sprays into both nostrils daily as needed for allergies or rhinitis.   furosemide 40 MG tablet Commonly known as:  LASIX Take 1 tablet (40 mg total) by mouth daily.    ibandronate 150 MG tablet Commonly known as:  BONIVA Take 150 mg by mouth every 30 (thirty) days. Take in the morning with a full glass of water, on an empty stomach, and do not take anything else by mouth or lie down for the next 30 min.   mirtazapine 15 MG disintegrating tablet Commonly known as:  REMERON SOL-TAB Take 0.5 tablets (7.5 mg total) by mouth at bedtime.   MULTI VITAMIN DAILY PO Take 1 tablet by mouth daily. Centrum silver   nadolol 40 MG tablet Commonly known as:  CORGARD Take 1 tablet (40 mg total) by mouth 2 (two) times daily. What changed:    medication strength  how much to take   OVER THE COUNTER MEDICATION Place 1 spray into the nose daily as needed (for nasal congestion). NeilMed Sinus Rinse   pantoprazole 40 MG tablet Commonly known as:  PROTONIX Take 1 tablet (40 mg total) by mouth daily.   Potassium Chloride ER 20 MEQ Tbcr Take 10 mEq by mouth daily.   warfarin 1 MG tablet Commonly known as:  COUMADIN Take 2 mg by mouth at bedtime.       Today   VITAL SIGNS:  Blood pressure (!) 165/87, pulse 72, temperature 97.7 F (36.5 C), temperature source Oral, resp. rate 16, height 5\' 7"  (1.702 m), weight 55.7 kg, SpO2 97 %.  I/O:    Intake/Output Summary (Last 24 hours) at 12/01/2017 0905 Last data filed at 12/01/2017 0608 Gross per 24 hour  Intake 107.52 ml  Output 1100 ml  Net -992.48 ml    PHYSICAL EXAMINATION:  Physical Exam  GENERAL:  84 y.o.-year-old patient lying in the bed with no acute distress.  LUNGS: Normal breath sounds bilaterally, no wheezing, rales,rhonchi or crepitation. No use of accessory muscles of respiration.  CARDIOVASCULAR: S1, S2 normal. No murmurs, rubs, or gallops.  ABDOMEN: Soft, non-tender, non-distended. Bowel sounds present. No organomegaly or mass.  NEUROLOGIC: Moves all 4 extremities. PSYCHIATRIC: The patient is alert and oriented x 3.  SKIN: No obvious rash, lesion, or ulcer.   DATA REVIEW:    CBC Recent Labs  Lab 11/28/17 1454  WBC 7.5  HGB 12.3  HCT 38.6  PLT 224    Chemistries  Recent Labs  Lab 11/28/17 1454  11/30/17 0443  NA 127*   < > 135  K 4.1   < > 3.7  CL 93*   < > 99  CO2 23   < > 25  GLUCOSE 138*   < > 115*  BUN 20   < > 23  CREATININE 0.71   < > 0.82  CALCIUM 8.5*   < > 8.4*  AST 33  --   --   ALT 27  --   --   ALKPHOS 99  --   --   BILITOT 0.8  --   --    < > =  values in this interval not displayed.    Cardiac Enzymes Recent Labs  Lab 11/28/17 1454  TROPONINI <0.03    Microbiology Results  Results for orders placed or performed during the hospital encounter of 11/28/17  Urine culture     Status: Abnormal (Preliminary result)   Collection Time: 11/28/17  5:22 PM  Result Value Ref Range Status   Specimen Description   Final    URINE, RANDOM Performed at [facility], [address], [place], [place] [address]    Special Requests   Final    NONE Performed at [facility], [address], [place], [place] [address]    Culture >=100,000 COLONIES/mL ESCHERICHIA COLI (A)  Final   Report Status PENDING  Incomplete  Culture, blood (routine x 2)     Status: None (Preliminary result)   Collection Time: 11/28/17  5:27 PM  Result Value Ref Range Status   Specimen Description BLOOD LEFT ANTECUBITAL  Final   Special Requests   Final    BOTTLES DRAWN AEROBIC AND ANAEROBIC Blood Culture adequate volume   Culture   Final    NO GROWTH 3 DAYS Performed at [facility], [address] [place], [place] [address]    Report Status PENDING  Incomplete  Culture, blood (routine x 2)     Status: None (Preliminary result)   Collection Time: 11/28/17  5:30 PM  Result Value Ref Range Status   Specimen Description BLOOD RIGHT ANTECUBITAL  Final   Special Requests   Final    BOTTLES DRAWN AEROBIC AND ANAEROBIC Blood Culture adequate volume   Culture   Final    NO GROWTH 3 DAYS Performed at [facility], [address], [place], [place] [address]    Report Status PENDING  Incomplete  MRSA PCR Screening     Status: None   Collection Time: 11/29/17  6:12 PM  Result Value Ref Range Status   MRSA by PCR NEGATIVE NEGATIVE Final    Comment:        The GeneXpert MRSA Assay (FDA approved for NASAL specimens only), is one component of a comprehensive MRSA colonization surveillance program. It is not intended to diagnose MRSA infection nor to guide or monitor treatment for MRSA infections. Performed at [facility], [address] [place], [place] [address]     RADIOLOGY:  Dg Chest 2 View  Result Date: 11/29/2017 CLINICAL DATA:  Hypoxia.  History of CHF, atrial fibrillation EXAM: CHEST - 2 VIEW COMPARISON:  Chest x-ray and chest CT scan of September 12, 2017 FINDINGS: The lungs are less well inflated today. There are bilateral pleural effusions greatest on the left. There is bibasilar atelectasis. The cardiac silhouette is enlarged. The pulmonary vascularity is prominent centrally with mild cephalization. The ICD is in stable position. IMPRESSION: CHF with mild interstitial edema. Bibasilar atelectasis with bilateral pleural effusions greatest on the left. Electronically Signed   By: David  Martinique M.D.   On: 11/29/2017 10:43    Follow up with PCP in 1 week.  Management plans discussed with the patient, family and they are in agreement.  CODE STATUS:     Code Status Orders  (From admission, onward)         Start     Ordered   11/28/17 1831  Do not attempt resuscitation (DNR)  Continuous    Question Answer Comment  In the event of cardiac or respiratory ARREST Do not call a "code blue"   In the  event of cardiac or respiratory ARREST Do not perform Intubation, CPR, defibrillation or ACLS   In the event of cardiac or respiratory ARREST Use medication by any route, position, wound care, and other measures to relive pain and suffering. May use oxygen, suction and manual treatment of  airway obstruction as needed for comfort.   Comments Nurse may pronounce      11/28/17 1830        Code Status History    Date Active Date Inactive Code Status Order ID Comments User Context   11/28/2017 1830 11/28/2017 1830 Full Code [id number]  Gorden Harms, MD ED   11/28/2017 1830 11/28/2017 1830 DNR [id number]  Salary, Avel Peace, MD ED   09/12/2017 1808 09/16/2017 1815 DNR [id number]  Dustin Flock, MD Inpatient   09/12/2017 1611 09/12/2017 1808 DNR [id number]  Dustin Flock, MD ED   08/26/2017 0521 08/27/2017 1640 Full Code [id number]  Lance Coon, MD Inpatient   06/23/2017 1732 06/24/2017 1701 Full Code [id number]  Gladstone Lighter, MD Inpatient   09/23/2016 1825 09/24/2016 1702 Full Code [id number]  Hessie Knows, MD Inpatient    Advance Directive Documentation     Most Recent Value  Type of Advance Directive  Living will  Pre-existing out of facility DNR order (yellow form or pink MOST form)  -  "MOST" Form in Place?  -      TOTAL TIME TAKING CARE OF THIS PATIENT ON DAY OF DISCHARGE: more than 30 minutes.   Neita Carp M.D on 12/01/2017 at 9:05 AM  Between 7am to 6pm - Pager - [phone]  After 6pm go to [email or web address] - password [email or web address] [facility]  Office  [phone]  CC: Primary care physician; Kirk Ruths, MD  Note: This dictation was prepared with Dragon dictation along with smaller phrase technology. Any transcriptional errors that result from this process are unintentional.

## 2017-12-01 NOTE — Consult Note (Signed)
ANTICOAGULATION CONSULT NOTE - Initial Consult  Pharmacy Consult for warfarin Indication: atrial fibrillation  Allergies  Allergen Reactions  . Latex Other (See Comments)    blisters  . Other Other (See Comments)    Blisters/ paper tape is Market researcher paper tape is Market researcher paper tape is Ok  Camera operator is Ok  . Tape Other (See Comments)    Blisters/ paper tape is Market researcher paper tape is Ok  . Xarelto [Rivaroxaban] Other (See Comments)    Gums bleeding and too much bruising  . Nickel Rash and Other (See Comments)    Patient Measurements: Height: 5\' 7"  (170.2 cm) Weight: 122 lb 14.4 oz (55.7 kg) IBW/kg (Calculated) : 61.6   Vital Signs: Temp: 97.7 F (36.5 C) (11/14 0802) Temp Source: Oral (11/14 0802) BP: 165/87 (11/14 0802) Pulse Rate: 72 (11/14 0802)  Labs: Recent Labs    11/28/17 1454  11/29/17 0448 11/30/17 0443 12/01/17 0330  HGB 12.3  --   --   --   --   HCT 38.6  --   --   --   --   PLT 224  --   --   --   --   LABPROT  --    < > 17.5* 20.0* 18.9*  INR  --    < > 1.45 1.73 1.60  CREATININE 0.71  --  0.72 0.82  --   TROPONINI <0.03  --   --   --   --    < > = values in this interval not displayed.    Estimated Creatinine Clearance: 46.5 mL/min (by C-G formula based on SCr of 0.82 mg/dL).   Medical History: Past Medical History:  Diagnosis Date  . Bradycardia   . 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 CHF (congestive heart failure) ([place])   . Hypertension   . Pacemaker 12/06/2003   Medtronic Impulse DTDR01  . Pacemaker 2015  . PAF (paroxysmal atrial fibrillation) ([facility])    a. on Coumadin and flecainide; b. CHADS2VASc 3 (HTN, age x 2, female)  . Sick sinus syndrome ([place])   . Sinoatrial node dysfunction (H[facility])   . Vaginal intraepithelial neoplasia     Medications:  11/11 AM INR 1.16 11/11 PM Warfarin 2mg        11/12 AM INR 1.45 11/12 PM Warfarin 2.5mg   11/13 AM INR 1.73 11/13 PM Warfarin 2.5mg   11/14 AM  INR 1.6 11/14 PM Warfarin 3mg   Goal of Therapy:  INR 2-3 Monitor platelets by anticoagulation protocol: Yes   Plan:  11/14 PM Warfarin 3mg  Will monitor INR 11/15 am and CBC q 3days per protocol    Lu Duffel, PharmD Clinical Pharmacist 12/01/2017 8:38 AM

## 2017-12-02 ENCOUNTER — Telehealth: Payer: Self-pay | Admitting: Internal Medicine

## 2017-12-02 DIAGNOSIS — I480 Paroxysmal atrial fibrillation: Secondary | ICD-10-CM | POA: Diagnosis not present

## 2017-12-02 DIAGNOSIS — I5032 Chronic diastolic (congestive) heart failure: Secondary | ICD-10-CM | POA: Diagnosis not present

## 2017-12-02 DIAGNOSIS — R2689 Other abnormalities of gait and mobility: Secondary | ICD-10-CM | POA: Diagnosis not present

## 2017-12-02 DIAGNOSIS — E43 Unspecified severe protein-calorie malnutrition: Secondary | ICD-10-CM | POA: Diagnosis not present

## 2017-12-02 DIAGNOSIS — I10 Essential (primary) hypertension: Secondary | ICD-10-CM | POA: Diagnosis not present

## 2017-12-02 DIAGNOSIS — M6281 Muscle weakness (generalized): Secondary | ICD-10-CM | POA: Diagnosis not present

## 2017-12-02 DIAGNOSIS — Z950 Presence of cardiac pacemaker: Secondary | ICD-10-CM | POA: Diagnosis not present

## 2017-12-02 NOTE — Telephone Encounter (Signed)
Please call regarding medication that was changed when pt was in the hospital.

## 2017-12-02 NOTE — Telephone Encounter (Signed)
Attempted to call the patient. No answer- no voice mail after multiple rings.    

## 2017-12-03 LAB — CULTURE, BLOOD (ROUTINE X 2)
CULTURE: NO GROWTH
CULTURE: NO GROWTH
SPECIAL REQUESTS: ADEQUATE
Special Requests: ADEQUATE

## 2017-12-05 DIAGNOSIS — Z950 Presence of cardiac pacemaker: Secondary | ICD-10-CM | POA: Diagnosis not present

## 2017-12-05 DIAGNOSIS — R2689 Other abnormalities of gait and mobility: Secondary | ICD-10-CM | POA: Diagnosis not present

## 2017-12-05 DIAGNOSIS — I5032 Chronic diastolic (congestive) heart failure: Secondary | ICD-10-CM | POA: Diagnosis not present

## 2017-12-05 DIAGNOSIS — I10 Essential (primary) hypertension: Secondary | ICD-10-CM | POA: Diagnosis not present

## 2017-12-05 DIAGNOSIS — M6281 Muscle weakness (generalized): Secondary | ICD-10-CM | POA: Diagnosis not present

## 2017-12-05 DIAGNOSIS — I480 Paroxysmal atrial fibrillation: Secondary | ICD-10-CM | POA: Diagnosis not present

## 2017-12-05 DIAGNOSIS — E43 Unspecified severe protein-calorie malnutrition: Secondary | ICD-10-CM | POA: Diagnosis not present

## 2017-12-07 NOTE — Telephone Encounter (Signed)
I spoke with the patient. She states she was in the hospital for 4 days last week and that her nadolol was doubled at discharge from 20 mg BID to 40 mg BID.  She states that she feels this is too much.  In the middle of the conversation someone came to pick her up and she stated "this was not a good time to talk." I asked if I could call her back at more convenient time.  She asked that I call her back between 11 am- 1 pm tomorrow.  I advised I will try to call her back at that time.

## 2017-12-08 DIAGNOSIS — I480 Paroxysmal atrial fibrillation: Secondary | ICD-10-CM | POA: Diagnosis not present

## 2017-12-08 DIAGNOSIS — I10 Essential (primary) hypertension: Secondary | ICD-10-CM | POA: Diagnosis not present

## 2017-12-08 DIAGNOSIS — R2689 Other abnormalities of gait and mobility: Secondary | ICD-10-CM | POA: Diagnosis not present

## 2017-12-08 DIAGNOSIS — Z950 Presence of cardiac pacemaker: Secondary | ICD-10-CM | POA: Diagnosis not present

## 2017-12-08 DIAGNOSIS — E43 Unspecified severe protein-calorie malnutrition: Secondary | ICD-10-CM | POA: Diagnosis not present

## 2017-12-08 DIAGNOSIS — M6281 Muscle weakness (generalized): Secondary | ICD-10-CM | POA: Diagnosis not present

## 2017-12-08 DIAGNOSIS — I5032 Chronic diastolic (congestive) heart failure: Secondary | ICD-10-CM | POA: Diagnosis not present

## 2017-12-08 NOTE — Telephone Encounter (Signed)
I spoke with the patient.  She states she has only been taking nadolol 30 mg BID as she felt that the prescribed dose of nadolol 40 mg BID at her recent discharge was too much. She has been taking her BP first thing in the morning with readings as follows:  11/21- 176/100 11/20- 177/100 11/19- 169/98 11/18- 177/99 11/17- 164/98  I have asked her to track her BP readings at least an hour after she takes her medications in the mornings for about 5 days, then I will call her next week to follow up.   She is agreeable and voices understanding.

## 2017-12-13 DIAGNOSIS — Z950 Presence of cardiac pacemaker: Secondary | ICD-10-CM | POA: Diagnosis not present

## 2017-12-13 DIAGNOSIS — M6281 Muscle weakness (generalized): Secondary | ICD-10-CM | POA: Diagnosis not present

## 2017-12-13 DIAGNOSIS — I10 Essential (primary) hypertension: Secondary | ICD-10-CM | POA: Diagnosis not present

## 2017-12-13 DIAGNOSIS — I5032 Chronic diastolic (congestive) heart failure: Secondary | ICD-10-CM | POA: Diagnosis not present

## 2017-12-13 DIAGNOSIS — R2689 Other abnormalities of gait and mobility: Secondary | ICD-10-CM | POA: Diagnosis not present

## 2017-12-13 DIAGNOSIS — I480 Paroxysmal atrial fibrillation: Secondary | ICD-10-CM | POA: Diagnosis not present

## 2017-12-13 DIAGNOSIS — E43 Unspecified severe protein-calorie malnutrition: Secondary | ICD-10-CM | POA: Diagnosis not present

## 2017-12-13 NOTE — Telephone Encounter (Signed)
I spoke with the patient to follow up on her BP readings.  She has been taking these ~ 1 hour after her medications in the morning:  11/22- 131/69 11/23- 130/70 11/24- 152/89 11/25- no reading 11/26- 148/79  The patient states she is feeling well on the nadolol 30 mg BID dosing. I have advised her I will review with Dr. Caryl Comes and only call her back if further recommendations are received.  She voices understanding and is agreeable.

## 2017-12-19 NOTE — Telephone Encounter (Signed)
Continue current therapies.

## 2017-12-20 DIAGNOSIS — R2689 Other abnormalities of gait and mobility: Secondary | ICD-10-CM | POA: Diagnosis not present

## 2017-12-20 DIAGNOSIS — I110 Hypertensive heart disease with heart failure: Secondary | ICD-10-CM | POA: Diagnosis not present

## 2017-12-20 DIAGNOSIS — E43 Unspecified severe protein-calorie malnutrition: Secondary | ICD-10-CM | POA: Diagnosis not present

## 2017-12-20 DIAGNOSIS — Z87440 Personal history of urinary (tract) infections: Secondary | ICD-10-CM | POA: Diagnosis not present

## 2017-12-20 DIAGNOSIS — I5032 Chronic diastolic (congestive) heart failure: Secondary | ICD-10-CM | POA: Diagnosis not present

## 2017-12-20 DIAGNOSIS — Z950 Presence of cardiac pacemaker: Secondary | ICD-10-CM | POA: Diagnosis not present

## 2017-12-20 DIAGNOSIS — R531 Weakness: Secondary | ICD-10-CM | POA: Diagnosis not present

## 2017-12-20 DIAGNOSIS — M6281 Muscle weakness (generalized): Secondary | ICD-10-CM | POA: Diagnosis not present

## 2017-12-20 DIAGNOSIS — I480 Paroxysmal atrial fibrillation: Secondary | ICD-10-CM | POA: Diagnosis not present

## 2017-12-22 DIAGNOSIS — I5032 Chronic diastolic (congestive) heart failure: Secondary | ICD-10-CM | POA: Diagnosis not present

## 2017-12-22 DIAGNOSIS — R531 Weakness: Secondary | ICD-10-CM | POA: Diagnosis not present

## 2017-12-22 DIAGNOSIS — Z87440 Personal history of urinary (tract) infections: Secondary | ICD-10-CM | POA: Diagnosis not present

## 2017-12-22 DIAGNOSIS — I480 Paroxysmal atrial fibrillation: Secondary | ICD-10-CM | POA: Diagnosis not present

## 2017-12-22 DIAGNOSIS — R2689 Other abnormalities of gait and mobility: Secondary | ICD-10-CM | POA: Diagnosis not present

## 2017-12-22 DIAGNOSIS — M6281 Muscle weakness (generalized): Secondary | ICD-10-CM | POA: Diagnosis not present

## 2017-12-22 DIAGNOSIS — E43 Unspecified severe protein-calorie malnutrition: Secondary | ICD-10-CM | POA: Diagnosis not present

## 2017-12-22 DIAGNOSIS — Z950 Presence of cardiac pacemaker: Secondary | ICD-10-CM | POA: Diagnosis not present

## 2017-12-22 DIAGNOSIS — I110 Hypertensive heart disease with heart failure: Secondary | ICD-10-CM | POA: Diagnosis not present

## 2017-12-22 NOTE — Progress Notes (Signed)
Patient ID: Alejandra Wiley, female    DOB: [birth year], 84 y.o.   MRN: [id number]  HPI  Alejandra Wiley is a 84 y/o female with a history of vaginal cancer, sick sinus syndrome, paroxysmal atrial fibrillation, HTN and chronic heart failure.   Echo report from 09/14/17 reviewed and showed an EF of 50-55% along with mod/severe MR and mild/mod TR with atrial fibrillation. Echo report from 06/24/17 reviewed and showed an EF of 50-55% along with mild/moderate MR and a mildly elevated PA pressure of 44 mm Hg.   Admitted 11/28/17 due to E. Coli UTI along with acute on chronic HF. Given IV antibiotics and then transitioned to oral antibiotics. Given IV diuretics initially and then transitioned to oral diuretics. Discharged after 3 days. Admitted 09/12/17 due to syncope. Cardiology and EP consults obtained. Negative fractures or bleeding in the brain. No pulmonary embolus. Pacemaker was reprogrammed. Discharged to SNF after 4 days for rehab. Admitted 08/26/17 due to acute pyelonephritis. Initially given IV antibiotics. Head CT was negative. Discharged the next day. Admitted 06/23/17 due to atrial tachycardia and pulmonary edema after kyphoplasty of T-9 on 06/23/17. Cardiology consult obtained. Discharged the following day.   She presents today for a follow-up visit with a chief complaint of moderate fatigue upon minimal exertion. She describes this as chronic in nature having been present for several years. She feels like it may be a little bit better than previously. She has associated shortness of breath, light-headedness, slight edema, palpitations and difficulty sleeping along with this. She denies any abdominal distention, chest pain or weight gain.   Past Medical History:  Diagnosis Date  . Bradycardia   . Cancer ([place])    vaginal intraepithelial neoplasia  . CHF (congestive heart failure) ([place])   . Hypertension   . Pacemaker 12/06/2003   Medtronic Impulse DTDR01  . Pacemaker 2015  . PAF (paroxysmal atrial  fibrillation) (HCC)    a. on Coumadin and flecainide; b. CHADS2VASc 3 (HTN, age x 2, female)  . Sick sinus syndrome ([place])   . Sinoatrial node dysfunction (HCC)   . Vaginal intraepithelial neoplasia    Past Surgical History:  Procedure Laterality Date  . APPENDECTOMY    . HERNIA REPAIR     lower abd  . IMAGE GUIDED SINUS SURGERY  2015  . INSERT / REPLACE / REMOVE PACEMAKER  12/06/2003   Medtronic impulse DTDR01  . JOINT REPLACEMENT Right    knee  . KYPHOPLASTY N/A 06/23/2017   Procedure: [id number];  Surgeon: Hessie Knows, MD;  Location: [facility] ORS;  Service: Orthopedics;  Laterality: N/A;  . LEEP  1999  . ORIF HIP FRACTURE Right 02/2016  . PACEMAKER GENERATOR CHANGE N/A 09/18/2012   Procedure: PACEMAKER GENERATOR CHANGE;  Surgeon: Deboraha Sprang, MD;  Location: [facility] CATH LAB;  Service: Cardiovascular;  Laterality: N/A;  . TONSILLECTOMY    . TOTAL KNEE ARTHROPLASTY    . TOTAL KNEE REVISION Right 09/23/2016   Procedure: TOTAL KNEE REVISION-POLYETHYLENE EXCHANGE;  Surgeon: Hessie Knows, MD;  Location: [facility] ORS;  Service: Orthopedics;  Laterality: Right;  Marland Kitchen VAGINAL HYSTERECTOMY     Family History  Problem Relation Age of Onset  . Stroke Mother   . Heart disease Father   . Diabetes Daughter   . Diabetes Son    Social History   Tobacco Use  . Smoking status: Never Smoker  . Smokeless tobacco: Never Used  Substance Use Topics  . Alcohol use: Yes    Alcohol/week: 1.0 standard drinks  Types: 1 Glasses of wine per week    Comment: occasionally   Allergies  Allergen Reactions  . Latex Other (See Comments)    blisters  . Other Other (See Comments)    Blisters/ paper tape is Market researcher paper tape is Market researcher paper tape is Ok  Camera operator is Ok  . Tape Other (See Comments)    Blisters/ paper tape is Market researcher paper tape is Ok  . Xarelto [Rivaroxaban] Other (See Comments)    Gums bleeding and too much bruising  . Nickel Rash and Other (See Comments)    Prior to Admission medications   Medication Sig Start Date End Date Taking? Authorizing Provider  acetaminophen (TYLENOL) 500 MG tablet Take 500 mg by mouth every 8 (eight) hours as needed for mild pain or headache.   Yes [provider]  diphenhydrAMINE (BENADRYL) 25 mg capsule Take 25 mg by mouth at bedtime as needed for sleep. Takes along with Tylenol    Yes [provider]  flecainide (TAMBOCOR) 100 MG tablet Take 1 tablet (100 mg total) by mouth 2 (two) times daily. 11/22/17  Yes Deboraha Sprang, MD  fluticasone [facility]) 50 MCG/ACT nasal spray Place 2 sprays into both nostrils daily as needed for allergies or rhinitis.    Yes [provider]  ibandronate (BONIVA) 150 MG tablet Take 150 mg by mouth every 30 (thirty) days. Take in the morning with a full glass of water, on an empty stomach, and do not take anything else by mouth or lie down for the next 30 min.   Yes [provider]  mirtazapine (REMERON SOL-TAB) 15 MG disintegrating tablet Take 0.5 tablets (7.5 mg total) by mouth at bedtime. 12/01/17 01/30/18 Yes Sudini, Alveta Heimlich, MD  nadolol (CORGARD) 40 MG tablet Take 1 tablet (40 mg total) by mouth 2 (two) times daily. Patient taking differently: Take 20 mg by mouth 2 (two) times daily.  12/01/17 12/31/17 Yes Sudini, Alveta Heimlich, MD  OVER THE COUNTER MEDICATION Place 1 spray into the nose daily as needed (for nasal congestion). NeilMed Sinus Rinse    Yes [provider]  potassium chloride 20 MEQ TBCR Take 10 mEq by mouth daily. 12/01/17  Yes Hillary Bow, MD  warfarin (COUMADIN) 1 MG tablet Take 2 mg by mouth at bedtime.    Yes [provider]  furosemide (LASIX) 40 MG tablet Take 1 tablet (40 mg total) by mouth daily. Patient not taking: Reported on 12/23/2017 12/01/17   Hillary Bow, MD    Review of Systems  Constitutional: Positive for appetite change (decreased) and fatigue (tire easily).  HENT: Positive for congestion (left sinus  congestion). Negative for postnasal drip and sore throat.   Eyes: Negative.   Respiratory: Positive for shortness of breath (with minimal exertion). Negative for chest tightness.   Cardiovascular: Positive for palpitations (at times) and leg swelling. Negative for chest pain.  Gastrointestinal: Negative for abdominal distention, abdominal pain, nausea and vomiting.  Endocrine: Negative.   Genitourinary: Negative.   Musculoskeletal: Positive for arthralgias (both legs hurt) and back pain.  Skin: Negative.   Allergic/Immunologic: Negative.   Neurological: Positive for light-headedness. Negative for dizziness.  Hematological: Negative for adenopathy. Does not bruise/bleed easily.  Psychiatric/Behavioral: Positive for hallucinations and sleep disturbance (sleeping 8-9 hours/ night along with 2 naps daily). Negative for dysphoric mood. The patient is nervous/anxious.    Vitals:   12/23/17 1316  BP: 137/81  Pulse: 72  Resp: 18  SpO2: 98%  Weight:  123 lb (55.8 kg)  Height: 5\' 7"  (1.702 m)   Wt Readings from Last 3 Encounters:  12/23/17 123 lb (55.8 kg)  12/01/17 122 lb 14.4 oz (55.7 kg)  11/08/17 123 lb (55.8 kg)   Lab Results  Component Value Date   CREATININE 0.82 11/30/2017   CREATININE 0.72 11/29/2017   CREATININE 0.71 11/28/2017    Physical Exam  Constitutional: She is oriented to person, place, and time. She appears well-developed and well-nourished.  HENT:  Head: Normocephalic and atraumatic.  Neck: Normal range of motion. Neck supple. No JVD present.  Cardiovascular: Normal rate and regular rhythm.  Pulmonary/Chest: Effort normal. No respiratory distress. She has no wheezes. She has no rhonchi. She has no rales.  Abdominal: Soft. Bowel sounds are normal.  Musculoskeletal:       Right lower leg: She exhibits no tenderness and no edema.       Left lower leg: She exhibits no tenderness and no edema.  Neurological: She is alert and oriented to person, place, and time.   Skin: Skin is warm and dry.  Psychiatric: She has a normal mood and affect. Her behavior is normal.  Nursing note and vitals reviewed.  Assessment & Plan:  1: Chronic heart failure with preserved ejection fraction- - NYHA class III - euvolemic today - weighing daily; Reminded to call for an overnight weight gain of >2 pounds or a weekly weight gain of >5 pounds - weight down 8 pounds from last visit 2 months ago - not adding salt to her food. Reviewed the importance of reading food labels and closely following a 2000mg  sodium diet.  - saw cardiology Rockey Situ) 10/10/17 - says that she's received her flu vaccine - receiving PT twice a week  2: Paroxymal atrial fibrillation- - saw EP Caryl Comes) 10/11/17 - INR 12/22/17 was 1.2 - pacemaker present - on flecainide  3: HTN- - BP looks good today - saw PCP Ouida Sills) 10/20/17 - BMP 12/22/17 reviewed and showed sodium 138, potassium 4.7, creatinine 1.1  and GFR 47  Medication list was reviewed.   Return in 3 months or sooner for any questions/problems before then.

## 2017-12-23 ENCOUNTER — Encounter: Payer: Self-pay | Admitting: Family

## 2017-12-23 ENCOUNTER — Ambulatory Visit: Payer: Medicare HMO | Attending: Family | Admitting: Family

## 2017-12-23 VITALS — BP 137/81 | HR 72 | Resp 18 | Ht 67.0 in | Wt 123.0 lb

## 2017-12-23 DIAGNOSIS — I495 Sick sinus syndrome: Secondary | ICD-10-CM | POA: Diagnosis not present

## 2017-12-23 DIAGNOSIS — Z96651 Presence of right artificial knee joint: Secondary | ICD-10-CM | POA: Insufficient documentation

## 2017-12-23 DIAGNOSIS — Z7901 Long term (current) use of anticoagulants: Secondary | ICD-10-CM | POA: Diagnosis not present

## 2017-12-23 DIAGNOSIS — Z888 Allergy status to other drugs, medicaments and biological substances status: Secondary | ICD-10-CM | POA: Insufficient documentation

## 2017-12-23 DIAGNOSIS — R5383 Other fatigue: Secondary | ICD-10-CM | POA: Diagnosis present

## 2017-12-23 DIAGNOSIS — Z8249 Family history of ischemic heart disease and other diseases of the circulatory system: Secondary | ICD-10-CM | POA: Diagnosis not present

## 2017-12-23 DIAGNOSIS — Z79899 Other long term (current) drug therapy: Secondary | ICD-10-CM | POA: Insufficient documentation

## 2017-12-23 DIAGNOSIS — Z950 Presence of cardiac pacemaker: Secondary | ICD-10-CM | POA: Diagnosis not present

## 2017-12-23 DIAGNOSIS — I5032 Chronic diastolic (congestive) heart failure: Secondary | ICD-10-CM | POA: Diagnosis not present

## 2017-12-23 DIAGNOSIS — I480 Paroxysmal atrial fibrillation: Secondary | ICD-10-CM | POA: Insufficient documentation

## 2017-12-23 DIAGNOSIS — I110 Hypertensive heart disease with heart failure: Secondary | ICD-10-CM | POA: Insufficient documentation

## 2017-12-23 DIAGNOSIS — I10 Essential (primary) hypertension: Secondary | ICD-10-CM

## 2017-12-23 NOTE — Patient Instructions (Signed)
Continue weighing daily and call for an overnight weight gain of > 2 pounds or a weekly weight gain of >5 pounds. 

## 2017-12-29 DIAGNOSIS — Z79899 Other long term (current) drug therapy: Secondary | ICD-10-CM | POA: Diagnosis not present

## 2017-12-29 DIAGNOSIS — I480 Paroxysmal atrial fibrillation: Secondary | ICD-10-CM | POA: Diagnosis not present

## 2018-01-03 DIAGNOSIS — E43 Unspecified severe protein-calorie malnutrition: Secondary | ICD-10-CM | POA: Diagnosis not present

## 2018-01-03 DIAGNOSIS — I110 Hypertensive heart disease with heart failure: Secondary | ICD-10-CM | POA: Diagnosis not present

## 2018-01-03 DIAGNOSIS — M6281 Muscle weakness (generalized): Secondary | ICD-10-CM | POA: Diagnosis not present

## 2018-01-03 DIAGNOSIS — R531 Weakness: Secondary | ICD-10-CM | POA: Diagnosis not present

## 2018-01-03 DIAGNOSIS — I480 Paroxysmal atrial fibrillation: Secondary | ICD-10-CM | POA: Diagnosis not present

## 2018-01-03 DIAGNOSIS — Z950 Presence of cardiac pacemaker: Secondary | ICD-10-CM | POA: Diagnosis not present

## 2018-01-03 DIAGNOSIS — I5032 Chronic diastolic (congestive) heart failure: Secondary | ICD-10-CM | POA: Diagnosis not present

## 2018-01-03 DIAGNOSIS — R2689 Other abnormalities of gait and mobility: Secondary | ICD-10-CM | POA: Diagnosis not present

## 2018-01-03 DIAGNOSIS — Z87440 Personal history of urinary (tract) infections: Secondary | ICD-10-CM | POA: Diagnosis not present

## 2018-01-04 DIAGNOSIS — M6281 Muscle weakness (generalized): Secondary | ICD-10-CM | POA: Diagnosis not present

## 2018-01-04 DIAGNOSIS — R2689 Other abnormalities of gait and mobility: Secondary | ICD-10-CM | POA: Diagnosis not present

## 2018-01-04 DIAGNOSIS — Z87440 Personal history of urinary (tract) infections: Secondary | ICD-10-CM | POA: Diagnosis not present

## 2018-01-04 DIAGNOSIS — I5032 Chronic diastolic (congestive) heart failure: Secondary | ICD-10-CM | POA: Diagnosis not present

## 2018-01-04 DIAGNOSIS — R531 Weakness: Secondary | ICD-10-CM | POA: Diagnosis not present

## 2018-01-04 DIAGNOSIS — Z950 Presence of cardiac pacemaker: Secondary | ICD-10-CM | POA: Diagnosis not present

## 2018-01-04 DIAGNOSIS — I480 Paroxysmal atrial fibrillation: Secondary | ICD-10-CM | POA: Diagnosis not present

## 2018-01-04 DIAGNOSIS — I110 Hypertensive heart disease with heart failure: Secondary | ICD-10-CM | POA: Diagnosis not present

## 2018-01-04 DIAGNOSIS — E43 Unspecified severe protein-calorie malnutrition: Secondary | ICD-10-CM | POA: Diagnosis not present

## 2018-01-06 DIAGNOSIS — I110 Hypertensive heart disease with heart failure: Secondary | ICD-10-CM | POA: Diagnosis not present

## 2018-01-06 DIAGNOSIS — Z950 Presence of cardiac pacemaker: Secondary | ICD-10-CM | POA: Diagnosis not present

## 2018-01-06 DIAGNOSIS — I5032 Chronic diastolic (congestive) heart failure: Secondary | ICD-10-CM | POA: Diagnosis not present

## 2018-01-06 DIAGNOSIS — M6281 Muscle weakness (generalized): Secondary | ICD-10-CM | POA: Diagnosis not present

## 2018-01-06 DIAGNOSIS — E43 Unspecified severe protein-calorie malnutrition: Secondary | ICD-10-CM | POA: Diagnosis not present

## 2018-01-06 DIAGNOSIS — R2689 Other abnormalities of gait and mobility: Secondary | ICD-10-CM | POA: Diagnosis not present

## 2018-01-06 DIAGNOSIS — Z87440 Personal history of urinary (tract) infections: Secondary | ICD-10-CM | POA: Diagnosis not present

## 2018-01-06 DIAGNOSIS — R531 Weakness: Secondary | ICD-10-CM | POA: Diagnosis not present

## 2018-01-06 DIAGNOSIS — I480 Paroxysmal atrial fibrillation: Secondary | ICD-10-CM | POA: Diagnosis not present

## 2018-01-09 DIAGNOSIS — I5032 Chronic diastolic (congestive) heart failure: Secondary | ICD-10-CM | POA: Diagnosis not present

## 2018-01-09 DIAGNOSIS — Z950 Presence of cardiac pacemaker: Secondary | ICD-10-CM | POA: Diagnosis not present

## 2018-01-09 DIAGNOSIS — R2689 Other abnormalities of gait and mobility: Secondary | ICD-10-CM | POA: Diagnosis not present

## 2018-01-09 DIAGNOSIS — J320 Chronic maxillary sinusitis: Secondary | ICD-10-CM | POA: Diagnosis not present

## 2018-01-09 DIAGNOSIS — E43 Unspecified severe protein-calorie malnutrition: Secondary | ICD-10-CM | POA: Diagnosis not present

## 2018-01-09 DIAGNOSIS — Z87440 Personal history of urinary (tract) infections: Secondary | ICD-10-CM | POA: Diagnosis not present

## 2018-01-09 DIAGNOSIS — R531 Weakness: Secondary | ICD-10-CM | POA: Diagnosis not present

## 2018-01-09 DIAGNOSIS — I110 Hypertensive heart disease with heart failure: Secondary | ICD-10-CM | POA: Diagnosis not present

## 2018-01-09 DIAGNOSIS — R0982 Postnasal drip: Secondary | ICD-10-CM | POA: Diagnosis not present

## 2018-01-09 DIAGNOSIS — I480 Paroxysmal atrial fibrillation: Secondary | ICD-10-CM | POA: Diagnosis not present

## 2018-01-09 DIAGNOSIS — M6281 Muscle weakness (generalized): Secondary | ICD-10-CM | POA: Diagnosis not present

## 2018-01-12 DIAGNOSIS — R531 Weakness: Secondary | ICD-10-CM | POA: Diagnosis not present

## 2018-01-12 DIAGNOSIS — I110 Hypertensive heart disease with heart failure: Secondary | ICD-10-CM | POA: Diagnosis not present

## 2018-01-12 DIAGNOSIS — Z950 Presence of cardiac pacemaker: Secondary | ICD-10-CM | POA: Diagnosis not present

## 2018-01-12 DIAGNOSIS — I5032 Chronic diastolic (congestive) heart failure: Secondary | ICD-10-CM | POA: Diagnosis not present

## 2018-01-12 DIAGNOSIS — M6281 Muscle weakness (generalized): Secondary | ICD-10-CM | POA: Diagnosis not present

## 2018-01-12 DIAGNOSIS — Z87440 Personal history of urinary (tract) infections: Secondary | ICD-10-CM | POA: Diagnosis not present

## 2018-01-12 DIAGNOSIS — I480 Paroxysmal atrial fibrillation: Secondary | ICD-10-CM | POA: Diagnosis not present

## 2018-01-12 DIAGNOSIS — R2689 Other abnormalities of gait and mobility: Secondary | ICD-10-CM | POA: Diagnosis not present

## 2018-01-12 DIAGNOSIS — E43 Unspecified severe protein-calorie malnutrition: Secondary | ICD-10-CM | POA: Diagnosis not present

## 2018-01-13 DIAGNOSIS — I480 Paroxysmal atrial fibrillation: Secondary | ICD-10-CM | POA: Diagnosis not present

## 2018-01-13 DIAGNOSIS — Z0189 Encounter for other specified special examinations: Secondary | ICD-10-CM | POA: Diagnosis not present

## 2018-01-19 ENCOUNTER — Ambulatory Visit (INDEPENDENT_AMBULATORY_CARE_PROVIDER_SITE_OTHER): Payer: Medicare HMO

## 2018-01-19 DIAGNOSIS — Z950 Presence of cardiac pacemaker: Secondary | ICD-10-CM | POA: Diagnosis not present

## 2018-01-19 DIAGNOSIS — I110 Hypertensive heart disease with heart failure: Secondary | ICD-10-CM | POA: Diagnosis not present

## 2018-01-19 DIAGNOSIS — R2689 Other abnormalities of gait and mobility: Secondary | ICD-10-CM | POA: Diagnosis not present

## 2018-01-19 DIAGNOSIS — E43 Unspecified severe protein-calorie malnutrition: Secondary | ICD-10-CM | POA: Diagnosis not present

## 2018-01-19 DIAGNOSIS — I5032 Chronic diastolic (congestive) heart failure: Secondary | ICD-10-CM | POA: Diagnosis not present

## 2018-01-19 DIAGNOSIS — M6281 Muscle weakness (generalized): Secondary | ICD-10-CM | POA: Diagnosis not present

## 2018-01-19 DIAGNOSIS — I495 Sick sinus syndrome: Secondary | ICD-10-CM

## 2018-01-19 DIAGNOSIS — Z87440 Personal history of urinary (tract) infections: Secondary | ICD-10-CM | POA: Diagnosis not present

## 2018-01-19 DIAGNOSIS — I480 Paroxysmal atrial fibrillation: Secondary | ICD-10-CM | POA: Diagnosis not present

## 2018-01-19 DIAGNOSIS — R531 Weakness: Secondary | ICD-10-CM | POA: Diagnosis not present

## 2018-01-19 LAB — CUP PACEART REMOTE DEVICE CHECK
Battery Impedance: 759 Ohm
Battery Remaining Longevity: 49 mo
Battery Voltage: 2.77 V
Brady Statistic AP VP Percent: 90 %
Brady Statistic AP VS Percent: 0 %
Brady Statistic AS VP Percent: 2 %
Implantable Lead Implant Date: 20051130
Implantable Lead Location: 753860
Implantable Lead Model: 5076
Lead Channel Impedance Value: 544 Ohm
Lead Channel Pacing Threshold Pulse Width: 0.4 ms
Lead Channel Setting Pacing Amplitude: 2.5 V
Lead Channel Setting Pacing Amplitude: 2.5 V
Lead Channel Setting Pacing Pulse Width: 0.46 ms
Lead Channel Setting Sensing Sensitivity: 2.8 mV
MDC IDC LEAD IMPLANT DT: 20051130
MDC IDC LEAD LOCATION: 753859
MDC IDC MSMT LEADCHNL RV IMPEDANCE VALUE: 547 Ohm
MDC IDC MSMT LEADCHNL RV PACING THRESHOLD AMPLITUDE: 1.125 V
MDC IDC PG IMPLANT DT: 20140901
MDC IDC SESS DTM: 20200102194838
MDC IDC STAT BRADY AS VS PERCENT: 8 %

## 2018-01-20 NOTE — Progress Notes (Signed)
Remote pacemaker transmission.   

## 2018-01-23 DIAGNOSIS — R2689 Other abnormalities of gait and mobility: Secondary | ICD-10-CM | POA: Diagnosis not present

## 2018-01-23 DIAGNOSIS — Z950 Presence of cardiac pacemaker: Secondary | ICD-10-CM | POA: Diagnosis not present

## 2018-01-23 DIAGNOSIS — M6281 Muscle weakness (generalized): Secondary | ICD-10-CM | POA: Diagnosis not present

## 2018-01-23 DIAGNOSIS — I110 Hypertensive heart disease with heart failure: Secondary | ICD-10-CM | POA: Diagnosis not present

## 2018-01-23 DIAGNOSIS — R531 Weakness: Secondary | ICD-10-CM | POA: Diagnosis not present

## 2018-01-23 DIAGNOSIS — E43 Unspecified severe protein-calorie malnutrition: Secondary | ICD-10-CM | POA: Diagnosis not present

## 2018-01-23 DIAGNOSIS — Z87440 Personal history of urinary (tract) infections: Secondary | ICD-10-CM | POA: Diagnosis not present

## 2018-01-23 DIAGNOSIS — I480 Paroxysmal atrial fibrillation: Secondary | ICD-10-CM | POA: Diagnosis not present

## 2018-01-23 DIAGNOSIS — I5032 Chronic diastolic (congestive) heart failure: Secondary | ICD-10-CM | POA: Diagnosis not present

## 2018-01-26 ENCOUNTER — Other Ambulatory Visit: Payer: Self-pay

## 2018-01-26 DIAGNOSIS — R2689 Other abnormalities of gait and mobility: Secondary | ICD-10-CM | POA: Diagnosis not present

## 2018-01-26 DIAGNOSIS — I480 Paroxysmal atrial fibrillation: Secondary | ICD-10-CM | POA: Diagnosis not present

## 2018-01-26 DIAGNOSIS — Z87440 Personal history of urinary (tract) infections: Secondary | ICD-10-CM | POA: Diagnosis not present

## 2018-01-26 DIAGNOSIS — I5032 Chronic diastolic (congestive) heart failure: Secondary | ICD-10-CM | POA: Diagnosis not present

## 2018-01-26 DIAGNOSIS — M6281 Muscle weakness (generalized): Secondary | ICD-10-CM | POA: Diagnosis not present

## 2018-01-26 DIAGNOSIS — I110 Hypertensive heart disease with heart failure: Secondary | ICD-10-CM | POA: Diagnosis not present

## 2018-01-26 DIAGNOSIS — R531 Weakness: Secondary | ICD-10-CM | POA: Diagnosis not present

## 2018-01-26 DIAGNOSIS — E43 Unspecified severe protein-calorie malnutrition: Secondary | ICD-10-CM | POA: Diagnosis not present

## 2018-01-26 DIAGNOSIS — Z950 Presence of cardiac pacemaker: Secondary | ICD-10-CM | POA: Diagnosis not present

## 2018-01-26 NOTE — Telephone Encounter (Signed)
[facility] pharmacy requesting a 90 day supply for Potassium chloride 20 Meq & Warfarin 2 mg.  Please review for refill. Thanks!

## 2018-01-30 DIAGNOSIS — M6281 Muscle weakness (generalized): Secondary | ICD-10-CM | POA: Diagnosis not present

## 2018-01-30 DIAGNOSIS — E43 Unspecified severe protein-calorie malnutrition: Secondary | ICD-10-CM | POA: Diagnosis not present

## 2018-01-30 DIAGNOSIS — R791 Abnormal coagulation profile: Secondary | ICD-10-CM | POA: Diagnosis not present

## 2018-01-30 DIAGNOSIS — R2689 Other abnormalities of gait and mobility: Secondary | ICD-10-CM | POA: Diagnosis not present

## 2018-01-30 DIAGNOSIS — I110 Hypertensive heart disease with heart failure: Secondary | ICD-10-CM | POA: Diagnosis not present

## 2018-01-30 DIAGNOSIS — Z87440 Personal history of urinary (tract) infections: Secondary | ICD-10-CM | POA: Diagnosis not present

## 2018-01-30 DIAGNOSIS — I480 Paroxysmal atrial fibrillation: Secondary | ICD-10-CM | POA: Diagnosis not present

## 2018-01-30 DIAGNOSIS — Z950 Presence of cardiac pacemaker: Secondary | ICD-10-CM | POA: Diagnosis not present

## 2018-01-30 DIAGNOSIS — R531 Weakness: Secondary | ICD-10-CM | POA: Diagnosis not present

## 2018-01-30 DIAGNOSIS — I5032 Chronic diastolic (congestive) heart failure: Secondary | ICD-10-CM | POA: Diagnosis not present

## 2018-01-30 NOTE — Telephone Encounter (Signed)
Please call the patient to clarify if:  1) is she taking potassium 20 meq- 1/2 tablet (10 meq) once daily.  If so, would she like a 10 meq tablet sent in?  2) who follows her for coumadin? It doesn't look like she's followed here?

## 2018-01-30 NOTE — Telephone Encounter (Signed)
[employer] calling to check status of refill request

## 2018-01-31 ENCOUNTER — Ambulatory Visit (INDEPENDENT_AMBULATORY_CARE_PROVIDER_SITE_OTHER): Payer: Medicare HMO | Admitting: Internal Medicine

## 2018-01-31 ENCOUNTER — Encounter: Payer: Self-pay | Admitting: Internal Medicine

## 2018-01-31 DIAGNOSIS — I480 Paroxysmal atrial fibrillation: Secondary | ICD-10-CM

## 2018-01-31 DIAGNOSIS — I495 Sick sinus syndrome: Secondary | ICD-10-CM

## 2018-01-31 DIAGNOSIS — Z950 Presence of cardiac pacemaker: Secondary | ICD-10-CM | POA: Diagnosis not present

## 2018-01-31 MED ORDER — NADOLOL 40 MG PO TABS: ORAL_TABLET | ORAL | 3 refills | Status: DC

## 2018-01-31 MED ORDER — WARFARIN SODIUM 1 MG PO TABS: 2.0000 mg | ORAL_TABLET | Freq: Every day | ORAL | 3 refills | Status: DC

## 2018-01-31 MED ORDER — POTASSIUM CHLORIDE ER 20 MEQ PO TBCR: 10.0000 meq | EXTENDED_RELEASE_TABLET | Freq: Every day | ORAL | 2 refills | Status: DC

## 2018-01-31 NOTE — Patient Instructions (Signed)
Medication Instructions:  - Your physician has recommended you make the following change in your medication:   1) INCREASE lasix (furosemide) 40 mg- take 2 tablets (80 mg) by mouth once daily x 3 days, then resume 1 tablet (40 mg) once daily  2) DECREASE corgard (nadolol) 40 mg- take 1 tablet (40 mg) by mouth once daily at bedtime x 3 days, then resume 1 tablet (40 mg) twice daily  3) INCREASE potassium 20 meq- take 1 tablet (20 meq) by mouth once daily x 3 days, then resume 1/2 tablet (10 meq) once daily  If you need a refill on your cardiac medications before your next appointment, please call your pharmacy.   Lab work: - none ordered  If you have labs (blood work) drawn today and your tests are completely normal, you will receive your results only by: Marland Kitchen MyChart Message (if you have MyChart) OR . A paper copy in the mail If you have any lab test that is abnormal or we need to change your treatment, we will call you to review the results.  Testing/Procedures: - none ordered  Follow-Up: At [facility], you and your health needs are our priority.  As part of our continuing mission to provide you with exceptional heart care, we have created designated Provider Care Teams.  These Care Teams include your primary Cardiologist (physician) and Advanced Practice Providers (APPs -  Physician Assistants and Nurse Practitioners) who all work together to provide you with the care you need, when you need it. . You will need a follow up appointment in 3 months with Dr. Caryl Comes- please call the office at the end of the month to try to schedule this appointment.   Any Other Special Instructions Will Be Listed Below (If Applicable). - N/A

## 2018-01-31 NOTE — Addendum Note (Signed)
Addended by: Alvis Lemmings C on: 01/31/2018 02:29 PM   Modules accepted: Orders

## 2018-01-31 NOTE — Telephone Encounter (Signed)
Tried to contact patient to discuss medications but voice mail was not properly working or full in order to leave a message to call back.

## 2018-01-31 NOTE — Progress Notes (Signed)
Patient Care Team: Kirk Ruths, MD as PCP - General (Unknown Physician Specialty)   HPI  Alejandra Wiley is a 85 y.o. female seen in followup for palpitations associated with atrial arrhythmias as well as bradycardia requiring backup bradycardia pacing. She takes flecainide   Her husband is no longer at home.    Hospitalized for kyphoplasty.  Complicated by atrial tachycardia with pacing at upper rate limit   Echocardiogram 2103 demonstrated normal left ventricular function  Date Cr Hgb  8/18 0.67 12.2  9/18 0.64 10   Hospitalized 8/19 with syncope, thought prob orthostatic with objective evidence variable, ( one day no change, the next day 30 + HR increase.)   Also preceded by atrial fib which might have contributed to assoc weakness and fatigue and excessive rates assoc with walking>> rate response reprogrammed   Flecainide was increased   Pacemaker interrogation >> atrial failure to capture and device reprogrammed; also leading to uninterpretable data re Atrial arrhythmia \ DATE PR interval QRSduration Dose  6/13 AR 268 112 50  6/19 AR 308  VP 138 75  9/19 -- -- 100   She is considerably better than she has been.  Less dyspnea.  Still some edema.  Notes it with dyspnea as well as phonation.  Also with orthostatic intolerance.  Husband is doing better.  Daughter came to visit for Christmas and they had a wonderful time.  Past Medical History:  Diagnosis Date  . Bradycardia   . Cancer ([place])    vaginal intraepithelial neoplasia  . CHF (congestive heart failure) ([place])   . Hypertension   . Pacemaker 12/06/2003   Medtronic Impulse DTDR01  . Pacemaker 2015  . PAF (paroxysmal atrial fibrillation) (H[facility])    a. on Coumadin and flecainide; b. CHADS2VASc 3 (HTN, age x 2, female)  . Sick sinus syndrome ([place])   . Sinoatrial node dysfunction (HCC)   . Vaginal intraepithelial neoplasia     Past Surgical History:  Procedure Laterality Date  . APPENDECTOMY      . HERNIA REPAIR     lower abd  . IMAGE GUIDED SINUS SURGERY  2015  . INSERT / REPLACE / REMOVE PACEMAKER  12/06/2003   Medtronic impulse DTDR01  . JOINT REPLACEMENT Right    knee  . KYPHOPLASTY N/A 06/23/2017   Procedure: [id number];  Surgeon: Hessie Knows, MD;  Location: [facility] ORS;  Service: Orthopedics;  Laterality: N/A;  . LEEP  1999  . ORIF HIP FRACTURE Right 02/2016  . PACEMAKER GENERATOR CHANGE N/A 09/18/2012   Procedure: PACEMAKER GENERATOR CHANGE;  Surgeon: Deboraha Sprang, MD;  Location: [facility] CATH LAB;  Service: Cardiovascular;  Laterality: N/A;  . TONSILLECTOMY    . TOTAL KNEE ARTHROPLASTY    . TOTAL KNEE REVISION Right 09/23/2016   Procedure: TOTAL KNEE REVISION-POLYETHYLENE EXCHANGE;  Surgeon: Hessie Knows, MD;  Location: [facility] ORS;  Service: Orthopedics;  Laterality: Right;  Marland Kitchen VAGINAL HYSTERECTOMY      Current Outpatient Medications  Medication Sig Dispense Refill  . acetaminophen (TYLENOL) 500 MG tablet Take 500 mg by mouth every 8 (eight) hours as needed for mild pain or headache.    . diphenhydrAMINE (BENADRYL) 25 mg capsule Take 25 mg by mouth at bedtime as needed for sleep. Takes along with Tylenol     . flecainide (TAMBOCOR) 100 MG tablet Take 1 tablet (100 mg total) by mouth 2 (two) times daily. 180 tablet 3  . fluticasone (FLONASE) 50 MCG/ACT nasal spray Place  2 sprays into both nostrils daily as needed for allergies or rhinitis.     . furosemide (LASIX) 40 MG tablet Take 1 tablet (40 mg total) by mouth daily. 30 tablet 0  . ibandronate (BONIVA) 150 MG tablet Take 150 mg by mouth every 30 (thirty) days. Take in the morning with a full glass of water, on an empty stomach, and do not take anything else by mouth or lie down for the next 30 min.    . mirtazapine (REMERON SOL-TAB) 15 MG disintegrating tablet Take 0.5 tablets (7.5 mg total) by mouth at bedtime. 30 tablet 0  . nadolol (CORGARD) 40 MG tablet Take 1 tablet (40 mg total) by mouth 2 (two) times daily. (Patient  taking differently: Take 20 mg by mouth 2 (two) times daily. ) 60 tablet 0  . OVER THE COUNTER MEDICATION Place 1 spray into the nose daily as needed (for nasal congestion). NeilMed Sinus Rinse     . pantoprazole (PROTONIX) 40 MG tablet Take 40 mg by mouth daily.    . Potassium Chloride ER 20 MEQ TBCR Take 10 mEq by mouth daily. 90 tablet 2  . warfarin (COUMADIN) 1 MG tablet Take 2 tablets (2 mg total) by mouth at bedtime. 90 tablet 3   No current facility-administered medications for this visit.     Allergies  Allergen Reactions  . Latex Other (See Comments)    blisters  . Other Other (See Comments)    Blisters/ paper tape is Market researcher paper tape is Market researcher paper tape is Ok  Camera operator is Ok  . Tape Other (See Comments)    Blisters/ paper tape is Market researcher paper tape is Ok  . Xarelto [Rivaroxaban] Other (See Comments)    Gums bleeding and too much bruising  . Nickel Rash and Other (See Comments)    Review of Systems negative except from HPI and PMH  Physical Exam BP 132/70 (BP Location: Left Arm, Patient Position: Sitting, Cuff Size: Normal)   Pulse 75   Ht 5\' 7"  (1.702 m)   Wt 120 lb 8 oz (54.7 kg)   SpO2 98%   BMI 18.87 kg/m  Well developed and nourished in no acute distress HENT normal Neck supple with JVP 8-10 Clear Regular rate and rhythm, no murmurs or gallops Abd-soft with active BS No Clubbing cyanosis 1+edema Skin-warm and dry A & Oriented  Grossly normal sensory and motor function   ECG   AV pacing with an upright QRS lead I and V1  Assessment and  Plan  Atrial arrhythmia/Fib  Sinus node dysfunction  Hypertension  Syncope  Pacemaker  Medtronic    Elevated pacing thresholds A Greater    HFpEF  Orthostatic hypotension    Continues with orthostasis.  Will need to consider compressive wear or medications.  For now have reviewed physiological maneuvers  She is volume overloaded.  We will try 3 days of increased  furosemide 40--80 with concomitant decrease in nadolol from twice daily--daily  Diet is salt limited already.  Fluid intake is significant; have encouraged decrease.  We have discussed the physiology of heart failure including the importance of salt restriction and fluid restriction and have reviewed sources of dietary salt and water.  On Anticoagulation;  No bleeding issues   No intercurrent atrial fibrillation or flutter    We spent more than 50% of our >25 min visit in face to face counseling regarding the above

## 2018-02-02 LAB — CUP PACEART INCLINIC DEVICE CHECK
Battery Impedance: 784 Ohm
Battery Remaining Longevity: 44 mo
Brady Statistic AP VS Percent: 0 %
Brady Statistic AS VS Percent: 7 %
Date Time Interrogation Session: 20200114184422
Implantable Lead Location: 753859
Implantable Lead Model: 5076
Lead Channel Impedance Value: 584 Ohm
Lead Channel Pacing Threshold Pulse Width: 0.46 ms
Lead Channel Pacing Threshold Pulse Width: 1 ms
Lead Channel Setting Pacing Amplitude: 2.5 V
Lead Channel Setting Pacing Amplitude: 2.5 V
Lead Channel Setting Sensing Sensitivity: 4 mV
MDC IDC LEAD IMPLANT DT: 20051130
MDC IDC LEAD IMPLANT DT: 20051130
MDC IDC LEAD LOCATION: 753860
MDC IDC MSMT BATTERY VOLTAGE: 2.77 V
MDC IDC MSMT LEADCHNL RA IMPEDANCE VALUE: 563 Ohm
MDC IDC MSMT LEADCHNL RA PACING THRESHOLD AMPLITUDE: 0.75 V
MDC IDC MSMT LEADCHNL RV PACING THRESHOLD AMPLITUDE: 1.25 V
MDC IDC PG IMPLANT DT: 20140901
MDC IDC SET LEADCHNL RV PACING PULSEWIDTH: 0.46 ms
MDC IDC STAT BRADY AP VP PERCENT: 91 %
MDC IDC STAT BRADY AS VP PERCENT: 2 %

## 2018-02-03 DIAGNOSIS — I110 Hypertensive heart disease with heart failure: Secondary | ICD-10-CM | POA: Diagnosis not present

## 2018-02-03 DIAGNOSIS — I5032 Chronic diastolic (congestive) heart failure: Secondary | ICD-10-CM | POA: Diagnosis not present

## 2018-02-03 DIAGNOSIS — Z950 Presence of cardiac pacemaker: Secondary | ICD-10-CM | POA: Diagnosis not present

## 2018-02-03 DIAGNOSIS — I480 Paroxysmal atrial fibrillation: Secondary | ICD-10-CM | POA: Diagnosis not present

## 2018-02-03 DIAGNOSIS — R2689 Other abnormalities of gait and mobility: Secondary | ICD-10-CM | POA: Diagnosis not present

## 2018-02-03 DIAGNOSIS — Z87440 Personal history of urinary (tract) infections: Secondary | ICD-10-CM | POA: Diagnosis not present

## 2018-02-03 DIAGNOSIS — E43 Unspecified severe protein-calorie malnutrition: Secondary | ICD-10-CM | POA: Diagnosis not present

## 2018-02-03 DIAGNOSIS — M6281 Muscle weakness (generalized): Secondary | ICD-10-CM | POA: Diagnosis not present

## 2018-02-03 DIAGNOSIS — R531 Weakness: Secondary | ICD-10-CM | POA: Diagnosis not present

## 2018-02-06 DIAGNOSIS — R791 Abnormal coagulation profile: Secondary | ICD-10-CM | POA: Diagnosis not present

## 2018-02-09 ENCOUNTER — Other Ambulatory Visit: Payer: Self-pay

## 2018-02-09 ENCOUNTER — Inpatient Hospital Stay
Admission: EM | Admit: 2018-02-09 | Discharge: 2018-02-10 | DRG: 065 | Disposition: A | Discharge status: Home Health | Payer: Medicare HMO | Attending: Internal Medicine | Admitting: Internal Medicine

## 2018-02-09 ENCOUNTER — Emergency Department: Payer: Medicare HMO

## 2018-02-09 DIAGNOSIS — I110 Hypertensive heart disease with heart failure: Secondary | ICD-10-CM | POA: Diagnosis present

## 2018-02-09 DIAGNOSIS — Z8589 Personal history of malignant neoplasm of other organs and systems: Secondary | ICD-10-CM | POA: Diagnosis not present

## 2018-02-09 DIAGNOSIS — E43 Unspecified severe protein-calorie malnutrition: Secondary | ICD-10-CM | POA: Diagnosis not present

## 2018-02-09 DIAGNOSIS — R2689 Other abnormalities of gait and mobility: Secondary | ICD-10-CM | POA: Diagnosis not present

## 2018-02-09 DIAGNOSIS — Z7901 Long term (current) use of anticoagulants: Secondary | ICD-10-CM

## 2018-02-09 DIAGNOSIS — I5032 Chronic diastolic (congestive) heart failure: Secondary | ICD-10-CM | POA: Diagnosis not present

## 2018-02-09 DIAGNOSIS — Z91048 Other nonmedicinal substance allergy status: Secondary | ICD-10-CM

## 2018-02-09 DIAGNOSIS — R531 Weakness: Secondary | ICD-10-CM

## 2018-02-09 DIAGNOSIS — Z888 Allergy status to other drugs, medicaments and biological substances status: Secondary | ICD-10-CM | POA: Diagnosis not present

## 2018-02-09 DIAGNOSIS — Z66 Do not resuscitate: Secondary | ICD-10-CM | POA: Diagnosis not present

## 2018-02-09 DIAGNOSIS — Z79899 Other long term (current) drug therapy: Secondary | ICD-10-CM | POA: Diagnosis not present

## 2018-02-09 DIAGNOSIS — I4891 Unspecified atrial fibrillation: Secondary | ICD-10-CM | POA: Diagnosis not present

## 2018-02-09 DIAGNOSIS — I480 Paroxysmal atrial fibrillation: Secondary | ICD-10-CM | POA: Diagnosis not present

## 2018-02-09 DIAGNOSIS — R29898 Other symptoms and signs involving the musculoskeletal system: Secondary | ICD-10-CM | POA: Diagnosis present

## 2018-02-09 DIAGNOSIS — R51 Headache: Secondary | ICD-10-CM | POA: Diagnosis not present

## 2018-02-09 DIAGNOSIS — Z96651 Presence of right artificial knee joint: Secondary | ICD-10-CM | POA: Diagnosis present

## 2018-02-09 DIAGNOSIS — I6389 Other cerebral infarction: Secondary | ICD-10-CM | POA: Diagnosis not present

## 2018-02-09 DIAGNOSIS — M25521 Pain in right elbow: Secondary | ICD-10-CM | POA: Diagnosis not present

## 2018-02-09 DIAGNOSIS — Z950 Presence of cardiac pacemaker: Secondary | ICD-10-CM

## 2018-02-09 DIAGNOSIS — Z87440 Personal history of urinary (tract) infections: Secondary | ICD-10-CM | POA: Diagnosis not present

## 2018-02-09 DIAGNOSIS — Z7951 Long term (current) use of inhaled steroids: Secondary | ICD-10-CM | POA: Diagnosis not present

## 2018-02-09 DIAGNOSIS — G8191 Hemiplegia, unspecified affecting right dominant side: Secondary | ICD-10-CM | POA: Diagnosis not present

## 2018-02-09 DIAGNOSIS — I639 Cerebral infarction, unspecified: Principal | ICD-10-CM | POA: Diagnosis present

## 2018-02-09 DIAGNOSIS — I495 Sick sinus syndrome: Secondary | ICD-10-CM | POA: Diagnosis present

## 2018-02-09 DIAGNOSIS — G459 Transient cerebral ischemic attack, unspecified: Secondary | ICD-10-CM | POA: Diagnosis not present

## 2018-02-09 DIAGNOSIS — Z823 Family history of stroke: Secondary | ICD-10-CM

## 2018-02-09 DIAGNOSIS — Z91040 Latex allergy status: Secondary | ICD-10-CM

## 2018-02-09 DIAGNOSIS — R29702 NIHSS score 2: Secondary | ICD-10-CM | POA: Diagnosis not present

## 2018-02-09 DIAGNOSIS — I509 Heart failure, unspecified: Secondary | ICD-10-CM | POA: Diagnosis not present

## 2018-02-09 DIAGNOSIS — Z90710 Acquired absence of both cervix and uterus: Secondary | ICD-10-CM | POA: Diagnosis not present

## 2018-02-09 DIAGNOSIS — I6523 Occlusion and stenosis of bilateral carotid arteries: Secondary | ICD-10-CM | POA: Diagnosis not present

## 2018-02-09 DIAGNOSIS — J449 Chronic obstructive pulmonary disease, unspecified: Secondary | ICD-10-CM | POA: Diagnosis not present

## 2018-02-09 DIAGNOSIS — M6281 Muscle weakness (generalized): Secondary | ICD-10-CM | POA: Diagnosis not present

## 2018-02-09 LAB — URINALYSIS, COMPLETE (UACMP) WITH MICROSCOPIC
Bacteria, UA: NONE SEEN
Bilirubin Urine: NEGATIVE
Glucose, UA: NEGATIVE mg/dL
Ketones, ur: NEGATIVE mg/dL
Leukocytes, UA: NEGATIVE
Nitrite: NEGATIVE
Protein, ur: NEGATIVE mg/dL
Specific Gravity, Urine: 1.006 (ref 1.005–1.030)
pH: 6 (ref 5.0–8.0)

## 2018-02-09 LAB — PROTIME-INR
INR: 1.57
Prothrombin Time: 18.6 seconds — ABNORMAL HIGH (ref 11.4–15.2)

## 2018-02-09 LAB — CBC
HCT: 37.8 % (ref 36.0–46.0)
Hemoglobin: 11.8 g/dL — ABNORMAL LOW (ref 12.0–15.0)
MCH: 28 pg (ref 26.0–34.0)
MCHC: 31.2 g/dL (ref 30.0–36.0)
MCV: 89.8 fL (ref 80.0–100.0)
Platelets: 220 10*3/uL (ref 150–400)
RBC: 4.21 MIL/uL (ref 3.87–5.11)
RDW: 17.2 % — ABNORMAL HIGH (ref 11.5–15.5)
WBC: 8.8 10*3/uL (ref 4.0–10.5)
nRBC: 0 % (ref 0.0–0.2)

## 2018-02-09 LAB — BASIC METABOLIC PANEL
Anion gap: 7 (ref 5–15)
BUN: 30 mg/dL — ABNORMAL HIGH (ref 8–23)
CHLORIDE: 98 mmol/L (ref 98–111)
CO2: 29 mmol/L (ref 22–32)
CREATININE: 0.95 mg/dL (ref 0.44–1.00)
Calcium: 9 mg/dL (ref 8.9–10.3)
GFR calc Af Amer: >60 mL/min (ref >60)
GFR, EST NON AFRICAN AMERICAN: 55 mL/min — AB (ref >60)
Glucose, Bld: 100 mg/dL — ABNORMAL HIGH (ref 70–99)
POTASSIUM: 4.2 mmol/L (ref 3.5–5.1)
SODIUM: 134 mmol/L — AB (ref 135–145)

## 2018-02-09 LAB — MRSA PCR SCREENING: MRSA by PCR: POSITIVE — AB

## 2018-02-09 LAB — GLUCOSE, CAPILLARY: Glucose-Capillary: 106 mg/dL — ABNORMAL HIGH (ref 70–99)

## 2018-02-09 LAB — TROPONIN I: Troponin I: <0.03 ng/mL (ref <0.03)

## 2018-02-09 MED ORDER — FUROSEMIDE 40 MG PO TABS
40.0000 mg | ORAL_TABLET | Freq: Every day | ORAL | Status: DC
  Administered 2018-02-10: 11:00:00 40 mg via ORAL
  Filled 2018-02-09: qty 1

## 2018-02-09 MED ORDER — DIPHENHYDRAMINE HCL 25 MG PO CAPS: 25.0000 mg | ORAL_CAPSULE | Freq: Every evening | ORAL | Status: DC | PRN

## 2018-02-09 MED ORDER — ACETAMINOPHEN 500 MG PO TABS: 500.0000 mg | ORAL_TABLET | Freq: Three times a day (TID) | ORAL | Status: DC | PRN

## 2018-02-09 MED ORDER — ACETAMINOPHEN 650 MG RE SUPP: 650.0000 mg | RECTAL | Status: DC | PRN

## 2018-02-09 MED ORDER — ACETAMINOPHEN 160 MG/5ML PO SOLN
650.0000 mg | ORAL | Status: DC | PRN
  Filled 2018-02-09: qty 20.3

## 2018-02-09 MED ORDER — ASPIRIN 325 MG PO TABS
325.0000 mg | ORAL_TABLET | Freq: Every day | ORAL | Status: DC
  Filled 2018-02-09 (×2): qty 1

## 2018-02-09 MED ORDER — POTASSIUM CHLORIDE CRYS ER 10 MEQ PO TBCR
10.0000 meq | EXTENDED_RELEASE_TABLET | Freq: Every day | ORAL | Status: DC
  Administered 2018-02-10: 11:00:00 10 meq via ORAL
  Filled 2018-02-09: qty 1

## 2018-02-09 MED ORDER — IBANDRONATE SODIUM 150 MG PO TABS: 150.0000 mg | ORAL_TABLET | ORAL | Status: DC

## 2018-02-09 MED ORDER — MIRTAZAPINE 15 MG PO TBDP
7.5000 mg | ORAL_TABLET | Freq: Every day | ORAL | Status: DC
  Administered 2018-02-09: 22:00:00 7.5 mg via ORAL
  Filled 2018-02-09 (×2): qty 0.5

## 2018-02-09 MED ORDER — WARFARIN - PHARMACIST DOSING INPATIENT
Freq: Every day | Status: DC
  Filled 2018-02-09: qty 1

## 2018-02-09 MED ORDER — FLUTICASONE PROPIONATE 50 MCG/ACT NA SUSP
2.0000 | Freq: Every day | NASAL | Status: DC | PRN
  Filled 2018-02-09: qty 16

## 2018-02-09 MED ORDER — STROKE: EARLY STAGES OF RECOVERY BOOK
Freq: Once | Status: AC
  Administered 2018-02-09: 21:00:00

## 2018-02-09 MED ORDER — NADOLOL 40 MG PO TABS
40.0000 mg | ORAL_TABLET | Freq: Every day | ORAL | Status: DC
  Administered 2018-02-10: 40 mg via ORAL
  Filled 2018-02-09: qty 1

## 2018-02-09 MED ORDER — WARFARIN SODIUM 2.5 MG PO TABS: 2.5000 mg | ORAL_TABLET | Freq: Every day | ORAL | Status: DC

## 2018-02-09 MED ORDER — MUPIROCIN 2 % EX OINT
1.0000 "application " | TOPICAL_OINTMENT | Freq: Two times a day (BID) | CUTANEOUS | Status: DC
  Administered 2018-02-10 (×2): 1 via NASAL
  Filled 2018-02-09: qty 22

## 2018-02-09 MED ORDER — SENNOSIDES-DOCUSATE SODIUM 8.6-50 MG PO TABS: 1.0000 | ORAL_TABLET | Freq: Every evening | ORAL | Status: DC | PRN

## 2018-02-09 MED ORDER — ACETAMINOPHEN 325 MG PO TABS
650.0000 mg | ORAL_TABLET | ORAL | Status: DC | PRN
  Administered 2018-02-09: 23:00:00 650 mg via ORAL
  Filled 2018-02-09: qty 2

## 2018-02-09 MED ORDER — FLECAINIDE ACETATE 100 MG PO TABS
100.0000 mg | ORAL_TABLET | Freq: Two times a day (BID) | ORAL | Status: DC
  Administered 2018-02-09 – 2018-02-10 (×2): 100 mg via ORAL
  Filled 2018-02-09 (×3): qty 1

## 2018-02-09 MED ORDER — WARFARIN SODIUM 4 MG PO TABS
4.0000 mg | ORAL_TABLET | Freq: Once | ORAL | Status: DC
  Filled 2018-02-09: qty 1

## 2018-02-09 MED ORDER — CHLORHEXIDINE GLUCONATE CLOTH 2 % EX PADS
6.0000 | MEDICATED_PAD | Freq: Every day | CUTANEOUS | Status: DC
  Administered 2018-02-10: 6 via TOPICAL

## 2018-02-09 MED ORDER — ASPIRIN 300 MG RE SUPP: 300.0000 mg | Freq: Every day | RECTAL | Status: DC

## 2018-02-09 NOTE — ED Triage Notes (Signed)
Pt is from [facility] C/O difficulty with ambulation - right arm weakness/pain - right leg heaviness - denies change in vision - c/o headache but denies dizziness or lightheadedness

## 2018-02-09 NOTE — Consult Note (Signed)
ANTICOAGULATION CONSULT NOTE - Initial Consult  Pharmacy Consult for warfarin dose management Indication: atrial fibrillation  Patient Measurements: Height: 5\' 7"  (170.2 cm) Weight: 118 lb (53.5 kg) IBW/kg (Calculated) : 61.6   Vital Signs: Temp: 98.2 F (36.8 C) (01/23 1255) Temp Source: Oral (01/23 1255) BP: 160/97 (01/23 1549) Pulse Rate: 74 (01/23 1549)  Labs: Recent Labs    02/09/18 1259 02/09/18 1728  HGB 11.8*  --   HCT 37.8  --   PLT 220  --   LABPROT  --  18.6*  INR  --  1.57  CREATININE 0.95  --   TROPONINI <0.03  --     Estimated Creatinine Clearance: 37.9 mL/min (by C-G formula based on SCr of 0.95 mg/dL).   Medical History: Past Medical History:  Diagnosis Date  . Bradycardia   . Cancer ([place])    vaginal intraepithelial neoplasia  . CHF (congestive heart failure) ([place])   . Hypertension   . Pacemaker 12/06/2003   Medtronic Impulse DTDR01  . Pacemaker 2015  . PAF (paroxysmal atrial fibrillation) (H[facility])    a. on Coumadin and flecainide; b. CHADS2VASc 3 (HTN, age x 2, female)  . Sick sinus syndrome ([place])   . Sinoatrial node dysfunction (HCC)   . Vaginal intraepithelial neoplasia     Assessment: 85 year-old female admitted today for evaluation of right sided weakness. She takes warfarin 2.5mg  daily PTA with her last dose 1/22. She is noted to have a subtherapeutic INR on admission with no compliance issues. She was admitted here to [facility] back in November 2019 where we managed her warfarin doses with 2-3 mg doses and she did not achieve a therapeutic INR prior to discharge. Evaluation of Care Everywhere shows multiple recent subtherapeutic INRs. There are no new significant DDIs as of this note.  Goal of Therapy:  INR 2-3 Monitor platelets by anticoagulation protocol: Yes   Plan:  Based on the above information it seems likely that her home dose is too low. A dose of warfarin 4 mg will be administered tonight and we will re-evaluate INR tomorrow and  daily to adjust dose.  Dallie Piles, PharmD 02/09/2018,7:30 PM

## 2018-02-09 NOTE — ED Provider Notes (Addendum)
[facility] Emergency Department Provider Note  ____________________________________________   I have reviewed the triage vital signs and the nursing notes.   HISTORY  Chief Complaint Weakness   History limited by: Not Limited   HPI Alejandra Wiley is a 85 y.o. female who presents to the emergency department today because of concerns by physical therapist today the patient might of had a stroke.  Patient states that for the past 2 weeks she has been feeling off.  She has a somewhat hard time describing exactly what she means but she does states she feels like she is having a harder time ambulating.  Patient had not noticed any specific unilateral weakness.  Today however when physical therapist came to evaluate her did notice some right-sided weakness.  Patient denies any headaches.  She has had some cough and phlegm recently.  Patient denies similar symptoms in the past.  She is on warfarin and states to have been having a hard time getting her levels right.   Per medical record review patient has a history of cancer, htn, pacemaker.  Past Medical History:  Diagnosis Date  . Bradycardia   . 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 CHF (congestive heart failure) ([place])   . Hypertension   . Pacemaker 12/06/2003   Medtronic Impulse DTDR01  . Pacemaker 2015  . PAF (paroxysmal atrial fibrillation) (HCC)    a. on Coumadin and flecainide; b. CHADS2VASc 3 (HTN, age x 2, female)  . Sick sinus syndrome ([place])   . Sinoatrial node dysfunction (HCC)   . Vaginal intraepithelial neoplasia     Patient Active Problem List   Diagnosis Date Noted  . UTI (urinary tract infection) 11/28/2017  . Protein calorie malnutrition ([place]) 10/10/2017  . Falls 10/10/2017  . Weakness 10/10/2017  . Muscle atrophy 10/10/2017  . Sinus node dysfunction ([place]) 10/10/2017  . Syncope and collapse 09/12/2017  . Pyelonephritis 08/26/2017  . Chronic diastolic heart failure ([place])  07/19/2017  . HTN (hypertension) 07/19/2017  . Painful total knee replacement ([place]) 09/23/2016  . 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 ATRIAL FIBRILLATION 06/13/2008  . PACEMAKER-Medtronic 06/13/2008    Past Surgical History:  Procedure Laterality Date  . APPENDECTOMY    . HERNIA REPAIR     lower abd  . IMAGE GUIDED SINUS SURGERY  2015  . INSERT / REPLACE / REMOVE PACEMAKER  12/06/2003   Medtronic impulse DTDR01  . JOINT REPLACEMENT Right    knee  . KYPHOPLASTY N/A 06/23/2017   Procedure: PPIRJJOACZY-S0;  Surgeon: Hessie Knows, MD;  Location: [facility] ORS;  Service: Orthopedics;  Laterality: N/A;  . LEEP  1999  . ORIF HIP FRACTURE Right 02/2016  . PACEMAKER GENERATOR CHANGE N/A 09/18/2012   Procedure: PACEMAKER GENERATOR CHANGE;  Surgeon: Deboraha Sprang, MD;  Location: [facility] CATH LAB;  Service: Cardiovascular;  Laterality: N/A;  . TONSILLECTOMY    . TOTAL KNEE ARTHROPLASTY    . TOTAL KNEE REVISION Right 09/23/2016   Procedure: TOTAL KNEE REVISION-POLYETHYLENE EXCHANGE;  Surgeon: Hessie Knows, MD;  Location: [facility] ORS;  Service: Orthopedics;  Laterality: Right;  Marland Kitchen VAGINAL HYSTERECTOMY      Prior to Admission medications   Medication Sig Start Date End Date Taking? Authorizing Provider  acetaminophen (TYLENOL) 500 MG tablet Take 500 mg by mouth every 8 (eight) hours as needed for mild pain or headache.    [provider]  diphenhydrAMINE (BENADRYL) 25 mg capsule Take 25 mg by mouth at bedtime as needed for  sleep. Takes along with Tylenol     [provider]  flecainide (TAMBOCOR) 100 MG tablet Take 1 tablet (100 mg total) by mouth 2 (two) times daily. 11/22/17   Deboraha Sprang, MD  fluticasone [facility]) 50 MCG/ACT nasal spray Place 2 sprays into both nostrils daily as needed for allergies or rhinitis.     [provider]  furosemide (LASIX) 40 MG tablet Take 1 tablet (40 mg total) by mouth daily. 12/01/17   Hillary Bow, MD  ibandronate (BONIVA) 150 MG tablet Take 150  mg by mouth every 30 (thirty) days. Take in the morning with a full glass of water, on an empty stomach, and do not take anything else by mouth or lie down for the next 30 min.    [provider]  mirtazapine (REMERON SOL-TAB) 15 MG disintegrating tablet Take 0.5 tablets (7.5 mg total) by mouth at bedtime. 12/01/17 01/31/18  Hillary Bow, MD  nadolol (CORGARD) 40 MG tablet Take 1 tablet (40 mg) by mouth twice daily 01/31/18   Deboraha Sprang, MD  OVER THE COUNTER MEDICATION Place 1 spray into the nose daily as needed (for nasal congestion). [facility] Sinus Rinse     [provider]  pantoprazole (PROTONIX) 40 MG tablet Take 40 mg by mouth daily.    [provider]  Potassium Chloride ER 20 MEQ TBCR Take 10 mEq by mouth daily. 01/31/18   Deboraha Sprang, MD  warfarin (COUMADIN) 1 MG tablet Take 2 tablets (2 mg total) by mouth at bedtime. 01/31/18   Deboraha Sprang, MD    Allergies Latex; Other; Tape; Xarelto [rivaroxaban]; and Nickel  Family History  Problem Relation Age of Onset  . Stroke Mother   . Heart disease Father   . Diabetes Daughter   . Diabetes Son     Social History Social History   Tobacco Use  . Smoking status: Never Smoker  . Smokeless tobacco: Never Used  Substance Use Topics  . Alcohol use: Yes    Alcohol/week: 1.0 standard drinks    Types: 1 Glasses of wine per week    Comment: occasionally  . Drug use: No    Review of Systems Constitutional: No fever/chills. Positive for generalized weakness.  Eyes: No visual changes. ENT: No sore throat. Cardiovascular: Denies chest pain. Respiratory: Denies shortness of breath. Gastrointestinal: No abdominal pain.  No nausea, no vomiting.  No diarrhea.   Genitourinary: Negative for dysuria. Musculoskeletal: Negative for back pain. Skin: Negative for rash. Neurological: Negative for headaches, focal weakness or numbness.  ____________________________________________   PHYSICAL EXAM:  VITAL  SIGNS: ED Triage Vitals  Enc Vitals Group     BP 02/09/18 1255 109/65     Pulse Rate 02/09/18 1255 72     Resp 02/09/18 1255 18     Temp 02/09/18 1255 98.2 F (36.8 C)     Temp Source 02/09/18 1255 Oral     SpO2 02/09/18 1255 100 %     Weight 02/09/18 1256 118 lb (53.5 kg)     Height 02/09/18 1256 5\' 7"  (1.702 m)     Head Circumference --      Peak Flow --      Pain Score 02/09/18 1256 0   Constitutional: Alert and oriented.  Eyes: Conjunctivae are normal.  ENT      Head: Normocephalic and atraumatic.      Nose: No congestion/rhinnorhea.      Mouth/Throat: Mucous membranes are moist.  Neck: No stridor. Hematological/Lymphatic/Immunilogical: No cervical lymphadenopathy. Cardiovascular: Normal rate, regular rhythm.  No murmurs, rubs, or gallops.  Respiratory: Normal respiratory effort without tachypnea nor retractions. Breath sounds are clear and equal bilaterally. No wheezes/rales/rhonchi. Gastrointestinal: Soft and non tender. No rebound. No guarding.  Genitourinary: Deferred Musculoskeletal: Normal range of motion in all extremities. No lower extremity edema. Neurologic:  Somewhat stuttering speech. Face symmetric. Tongue midline. Right upper extremity drift. Right lower extremity 4+/5, left lower extremity 5/5. Skin:  Skin is warm, dry and intact. No rash noted. Psychiatric: Mood and affect are normal. Speech and behavior are normal. Patient exhibits appropriate insight and judgment.  ____________________________________________    LABS (pertinent positives/negatives)  BMP na 134, k 4.2, glu 100, cr 0.95 CBC wbc 8.8, hgb 11.8, plt 220 Trop <0.03 UA clear, moderate hgb dipstick, 0-5 rbc and wbc  ____________________________________________   EKG  I, Nance Pear, attending physician, personally viewed and interpreted this EKG  EKG Time: 1303 Rate: 72 Rhythm: atrial paced rhythm Axis: left axis deviation Intervals: qtc 510 QRS: LBBB ST changes: no st  elevation Impression: abnormal ekg   ____________________________________________    RADIOLOGY  CT head No acute findings  ____________________________________________   PROCEDURES  Procedures  ____________________________________________   INITIAL IMPRESSION / ASSESSMENT AND PLAN / ED COURSE  Pertinent labs & imaging results that were available during my care of the patient were reviewed by me and considered in my medical decision making (see chart for details).   Patient presents to the emergency department today because of concerns for right-sided weakness identified by physical therapy today.  Patient states she has not been feeling well for the past week.  On my exam she does have right upper extremity drift as well as decreased right lower extremity weakness.  Patient's head CT did not show any acute stroke.  Unfortunately given that the patient has a pacemaker unable to obtain MRI report I do have concerns however the patient had a stroke.  Will plan on admission.  Discussed findings and plan with patient.   ____________________________________________   FINAL CLINICAL IMPRESSION(S) / ED DIAGNOSES  Final diagnoses:  Right sided weakness     Note: This dictation was prepared with Dragon dictation. Any transcriptional errors that result from this process are unintentional     Nance Pear, MD 02/09/18 Katheren Puller, MD 02/09/18 [phone]

## 2018-02-09 NOTE — Progress Notes (Signed)
Family Meeting Note  Advance Directive:yes  Today a meeting took place with the Patient.  Patient is able to participate   The following clinical team members were present during this meeting:MD  The following were discussed:Patient's diagnosis: Sick sinus syndrome, COPD, heart failure, A. fib, Patient's progosis: Unable to determine and Goals for treatment: DNR  Additional follow-up to be provided: prn  Time spent during discussion:20 minutes  Gorden Harms, MD

## 2018-02-09 NOTE — ED Notes (Signed)
Took patient to bathroom to void--rolled her to bathroom in recliner.

## 2018-02-09 NOTE — ED Notes (Signed)
Pt passed swallow screen test. This RN has provided a blanket, meal tray and milk for pt. This RN will continue to monitor pt.

## 2018-02-09 NOTE — Progress Notes (Signed)

## 2018-02-09 NOTE — ED Notes (Signed)
ED TO INPATIENT HANDOFF REPORT  Name/Age/Gender Alejandra Wiley 85 y.o. female  Code Status Code Status History    Date Active Date Inactive Code Status Order ID Comments User Context   11/28/2017 1831 12/01/2017 1434 DNR [id number]  Gorden Harms, MD ED   11/28/2017 1830 11/28/2017 1830 Full Code [id number]  Salary, Avel Peace, MD ED   11/28/2017 1830 11/28/2017 1830 DNR [id number]  Salary, Avel Peace, MD ED   09/12/2017 1808 09/16/2017 1815 DNR [id number]  Dustin Flock, MD Inpatient   09/12/2017 1611 09/12/2017 1808 DNR [id number]  Dustin Flock, MD ED   08/26/2017 0521 08/27/2017 1640 Full Code [id number]  Lance Coon, MD Inpatient   06/23/2017 1732 06/24/2017 1701 Full Code [id number]  Gladstone Lighter, MD Inpatient   09/23/2016 1825 09/24/2016 1702 Full Code [id number]  Hessie Knows, MD Inpatient    Questions for Most Recent Historical Code Status (Order [id number])    Question Answer Comment   In the event of cardiac or respiratory ARREST Do not call a "code blue"    In the event of cardiac or respiratory ARREST Do not perform Intubation, CPR, defibrillation or ACLS    In the event of cardiac or respiratory ARREST Use medication by any route, position, wound care, and other measures to relive pain and suffering. May use oxygen, suction and manual treatment of airway obstruction as needed for comfort.    Comments Nurse may pronounce       Home/SNF/Other Nursing Home  Chief Complaint weakness  Level of Care/Admitting Diagnosis ED Disposition    ED Disposition Condition [place]: [facility] [[id number]]  Level of Care: Med-Surg [16]  Diagnosis: Arm weakness [[id number]]  Admitting Physician: Gorden Harms [[id number]]  Attending Physician: Gorden Harms [[id number]]  Estimated length of stay: past midnight tomorrow  Certification:: I certify this patient will need inpatient services for at least 2 midnights  PT Class (Do Not Modify): Inpatient  [101]  PT Acc Code (Do Not Modify): Private [1]       Medical History Past Medical History:  Diagnosis Date  . Bradycardia   . Cancer ([place])    vaginal intraepithelial neoplasia  . CHF (congestive heart failure) ([place])   . Hypertension   . Pacemaker 12/06/2003   Medtronic Impulse DTDR01  . Pacemaker 2015  . PAF (paroxysmal atrial fibrillation) (H[facility])    a. on Coumadin and flecainide; b. CHADS2VASc 3 (HTN, age x 2, female)  . Sick sinus syndrome ([place])   . Sinoatrial node dysfunction (HCC)   . Vaginal intraepithelial neoplasia     Allergies Allergies  Allergen Reactions  . Latex Other (See Comments)    blisters  . Other Other (See Comments)    Blisters/ paper tape is Market researcher paper tape is Market researcher paper tape is Ok  Camera operator is Ok  . Tape Other (See Comments)    Blisters/ paper tape is Market researcher paper tape is Ok  . Xarelto [Rivaroxaban] Other (See Comments)    Gums bleeding and too much bruising  . Nickel Rash and Other (See Comments)    IV Location/Drains/Wounds Patient Lines/Drains/Airways Status   Active Line/Drains/Airways    Name:   Placement date:   Placement time:   Site:   Days:   Peripheral IV 09/14/17 Left;Lateral Forearm   09/14/17    1847    Forearm   148   Wound / Incision (Open or Dehisced) 09/12/17 Other (Comment) Elbow Left  skin tear   09/12/17    1907    Elbow   150          Labs/Imaging Results for orders placed or performed during the hospital encounter of 02/09/18 (from the past 48 hour(s))  Glucose, capillary     Status: Abnormal   Collection Time: 02/09/18 12:55 PM  Result Value Ref Range   Glucose-Capillary 106 (H) 70 - 99 mg/dL  Basic metabolic panel     Status: Abnormal   Collection Time: 02/09/18 12:59 PM  Result Value Ref Range   Sodium 134 (L) 135 - 145 mmol/L   Potassium 4.2 3.5 - 5.1 mmol/L   Chloride 98 98 - 111 mmol/L   CO2 29 22 - 32 mmol/L   Glucose, Bld 100 (H) 70 - 99 mg/dL   BUN 30 (H) 8 - 23  mg/dL   Creatinine, Ser 0.95 0.44 - 1.00 mg/dL   Calcium 9.0 8.9 - 10.3 mg/dL   GFR calc non Af Amer 55 (L) >60 mL/min   GFR calc Af Amer >60 >60 mL/min   Anion gap 7 5 - 15    Comment: Performed at [facility], [address] [place], [place] [address]  CBC     Status: Abnormal   Collection Time: 02/09/18 12:59 PM  Result Value Ref Range   WBC 8.8 4.0 - 10.5 K/uL   RBC 4.21 3.87 - 5.11 MIL/uL   Hemoglobin 11.8 (L) 12.0 - 15.0 g/dL   HCT 37.8 36.0 - 46.0 %   MCV 89.8 80.0 - 100.0 fL   MCH 28.0 26.0 - 34.0 pg   MCHC 31.2 30.0 - 36.0 g/dL   RDW 17.2 (H) 11.5 - 15.5 %   Platelets 220 150 - 400 K/uL   nRBC 0.0 0.0 - 0.2 %    Comment: Performed at [facility], [address] [place], [place] [address]  Troponin I - Add-On to previous collection     Status: None   Collection Time: 02/09/18 12:59 PM  Result Value Ref Range   Troponin I <0.03 <0.03 ng/mL    Comment: Performed at [facility], [address] [place], [place] [address]  Urinalysis, Complete w Microscopic     Status: Abnormal   Collection Time: 02/09/18  3:59 PM  Result Value Ref Range   Color, Urine STRAW (A) YELLOW   APPearance CLEAR (A) CLEAR   Specific Gravity, Urine 1.006 1.005 - 1.030   pH 6.0 5.0 - 8.0   Glucose, UA NEGATIVE NEGATIVE mg/dL   Hgb urine dipstick MODERATE (A) NEGATIVE   Bilirubin Urine NEGATIVE NEGATIVE   Ketones, ur NEGATIVE NEGATIVE mg/dL   Protein, ur NEGATIVE NEGATIVE mg/dL   Nitrite NEGATIVE NEGATIVE   Leukocytes, UA NEGATIVE NEGATIVE   RBC / HPF 0-5 0 - 5 RBC/hpf   WBC, UA 0-5 0 - 5 WBC/hpf   Bacteria, UA NONE SEEN NONE SEEN   Squamous Epithelial / LPF 0-5 0 - 5    Comment: Performed at [facility], [address] [place], [place] [address]  Protime-INR     Status: Abnormal   Collection Time: 02/09/18  5:28 PM  Result Value Ref Range   Prothrombin Time 18.6 (H) 11.4 - 15.2 seconds   INR 1.57     Comment: Performed at [facility], [place],  [address]   Ct Head Wo Contrast  Result Date: 02/09/2018 CLINICAL DATA:  Difficulty with ambulation EXAM: CT HEAD  WITHOUT CONTRAST TECHNIQUE: Contiguous axial images were obtained from the base of the skull through the vertex without intravenous contrast. COMPARISON:  09/12/2017 head CT FINDINGS: Brain: No acute territorial infarction, hemorrhage or intracranial mass. Atrophy with mild small vessel ischemic changes of the white matter. Stable ventricle size. Vascular: No hyperdense vessels.  Carotid vascular calcification Skull: Normal. Negative for fracture or focal lesion. Sinuses/Orbits: Mucosal thickening in the ethmoid and maxillary sinuses and left frontal sinus. Other: None IMPRESSION: 1. No CT evidence for acute intracranial abnormality. 2. Atrophy and mild small vessel ischemic changes of the white matter 3. Paranasal sinus disease Electronically Signed   By: Donavan Foil M.D.   On: 02/09/2018 17:03    Pending Labs Unresulted Labs (From admission, onward)    Start     Ordered   02/10/18 0500  Protime-INR  Daily,   STAT     02/09/18 1916   Signed and Held  Hemoglobin A1c  Tomorrow morning,   R     Signed and Held   Signed and Held  Lipid panel  Tomorrow morning,   R    Comments:  Fasting    Signed and Held          Vitals/Pain Today's Vitals   02/09/18 1255 02/09/18 1256 02/09/18 1540 02/09/18 1549  BP: 109/65  (!) 160/97 (!) 160/97  Pulse: 72  94 74  Resp: 18   14  Temp: 98.2 F (36.8 C)     TempSrc: Oral     SpO2: 100%  100% 100%  Weight:  53.5 kg    Height:  5\' 7"  (1.702 m)    PainSc:  0-No pain      Isolation Precautions No active isolations  Medications Medications - No data to display  Mobility walks

## 2018-02-09 NOTE — H&P (Signed)
[facility] [facility] NAME: Alejandra Wiley    MR#:  [id number]  DATE OF BIRTH:  02/09/1932  DATE OF ADMISSION:  02/09/2018  PRIMARY CARE PHYSICIAN: Kirk Ruths, MD   REQUESTING/REFERRING PHYSICIAN:   CHIEF COMPLAINT:   Chief Complaint  Patient presents with  . Weakness    HISTORY OF PRESENT ILLNESS: Alejandra Wiley  is a 85 y.o. female with a known history per below presenting with generalized weakness/fatigue over a week, was found by physical therapist to have right arm and leg weakness more than usual, patient sent to the emergency room for further evaluation/care, ER work-up largely unimpressive, CT head was negative, unable to do MRI of the brain due to presence of pacemaker, patient evaluated in the emergency room, no apparent distress, resting comfortably in bed, patient is now been admitted for acute right arm and leg weakness concerning for acute CVA.  PAST MEDICAL HISTORY:   Past Medical History:  Diagnosis Date  . Bradycardia   . 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 CHF (congestive heart failure) ([place])   . Hypertension   . Pacemaker 12/06/2003   Medtronic Impulse DTDR01  . Pacemaker 2015  . PAF (paroxysmal atrial fibrillation) (H[facility])    a. on Coumadin and flecainide; b. CHADS2VASc 3 (HTN, age x 2, female)  . Sick sinus syndrome ([place])   . Sinoatrial node dysfunction (H[facility])   . Vaginal intraepithelial neoplasia     PAST SURGICAL HISTORY:  Past Surgical History:  Procedure Laterality Date  . APPENDECTOMY    . HERNIA REPAIR     lower abd  . IMAGE GUIDED SINUS SURGERY  2015  . INSERT / REPLACE / REMOVE PACEMAKER  12/06/2003   Medtronic impulse DTDR01  . JOINT REPLACEMENT Right    knee  . KYPHOPLASTY N/A 06/23/2017   Procedure: [id number];  Surgeon: Hessie Knows, MD;  Location: [facility] ORS;  Service: Orthopedics;  Laterality: N/A;  . LEEP  1999  . ORIF HIP FRACTURE Right 02/2016  . PACEMAKER  GENERATOR CHANGE N/A 09/18/2012   Procedure: PACEMAKER GENERATOR CHANGE;  Surgeon: Deboraha Sprang, MD;  Location: [facility] CATH LAB;  Service: Cardiovascular;  Laterality: N/A;  . TONSILLECTOMY    . TOTAL KNEE ARTHROPLASTY    . TOTAL KNEE REVISION Right 09/23/2016   Procedure: TOTAL KNEE REVISION-POLYETHYLENE EXCHANGE;  Surgeon: Hessie Knows, MD;  Location: [facility] ORS;  Service: Orthopedics;  Laterality: Right;  Marland Kitchen VAGINAL HYSTERECTOMY      SOCIAL HISTORY:  Social History   Tobacco Use  . Smoking status: Never Smoker  . Smokeless tobacco: Never Used  Substance Use Topics  . Alcohol use: Yes    Alcohol/week: 1.0 standard drinks    Types: 1 Glasses of wine per week    Comment: occasionally    FAMILY HISTORY:  Family History  Problem Relation Age of Onset  . Stroke Mother   . Heart disease Father   . Diabetes Daughter   . Diabetes Son     DRUG ALLERGIES:  Allergies  Allergen Reactions  . Latex Other (See Comments)    blisters  . Other Other (See Comments)    Blisters/ paper tape is Market researcher paper tape is Market researcher paper tape is Ok  Camera operator is Ok  . Tape Other (See Comments)    Blisters/ paper tape is Market researcher paper tape is Ok  . Xarelto [Rivaroxaban] Other (See Comments)    Gums bleeding  and too much bruising  . Nickel Rash and Other (See Comments)    REVIEW OF SYSTEMS:   CONSTITUTIONAL: No fever+ generalized weakness EYES: No blurred or double vision.  EARS, NOSE, AND THROAT: No tinnitus or ear pain.  RESPIRATORY: No cough, shortness of breath, wheezing or hemoptysis.  CARDIOVASCULAR: No chest pain, orthopnea, edema.  GASTROINTESTINAL: No nausea, vomiting, diarrhea or abdominal pain.  GENITOURINARY: No dysuria, hematuria.  ENDOCRINE: No polyuria, nocturia,  HEMATOLOGY: No anemia, easy bruising or bleeding SKIN: No rash or lesion. MUSCULOSKELETAL: No joint pain or arthritis.   NEUROLOGIC: Right arm/leg weakness   PSYCHIATRY: No anxiety or  depression.   MEDICATIONS AT HOME:  Prior to Admission medications   Medication Sig Start Date End Date Taking? Authorizing Provider  acetaminophen (TYLENOL) 500 MG tablet Take 500 mg by mouth every 8 (eight) hours as needed for mild pain or headache.   Yes [provider]  diphenhydrAMINE (BENADRYL) 25 mg capsule Take 25 mg by mouth at bedtime as needed for sleep. Takes along with Tylenol    Yes [provider]  flecainide (TAMBOCOR) 100 MG tablet Take 1 tablet (100 mg total) by mouth 2 (two) times daily. 11/22/17  Yes Deboraha Sprang, MD  fluticasone [facility]) 50 MCG/ACT nasal spray Place 2 sprays into both nostrils daily as needed for allergies or rhinitis.    Yes [provider]  furosemide (LASIX) 40 MG tablet Take 1 tablet (40 mg total) by mouth daily. 12/01/17  Yes Sudini, Alveta Heimlich, MD  ibandronate (BONIVA) 150 MG tablet Take 150 mg by mouth every 30 (thirty) days. Take in the morning with a full glass of water, on an empty stomach, and do not take anything else by mouth or lie down for the next 30 min.   Yes [provider]  mirtazapine (REMERON SOL-TAB) 15 MG disintegrating tablet Take 0.5 tablets (7.5 mg total) by mouth at bedtime. 12/01/17 02/09/18 Yes Sudini, Alveta Heimlich, MD  nadolol (CORGARD) 40 MG tablet Take 1 tablet (40 mg) by mouth twice daily Patient taking differently: Take 40 mg by mouth daily.  01/31/18  Yes Deboraha Sprang, MD  OVER THE COUNTER MEDICATION Place 1 spray into the nose daily as needed (for nasal congestion). [facility] Sinus Rinse    Yes [provider]  Potassium Chloride ER 20 MEQ TBCR Take 10 mEq by mouth daily. 01/31/18  Yes Deboraha Sprang, MD  warfarin (COUMADIN) 1 MG tablet Take 2 tablets (2 mg total) by mouth at bedtime. Patient taking differently: Take 2.5 mg by mouth at bedtime.  01/31/18  Yes Deboraha Sprang, MD      PHYSICAL EXAMINATION:   VITAL SIGNS: Blood pressure (!) 160/97, pulse 74, temperature 98.2 F (36.8  C), temperature source Oral, resp. rate 14, height 5\' 7"  (1.702 m), weight 53.5 kg, SpO2 100 %.  GENERAL:  85 y.o.-year-old patient lying in the bed with no acute distress.  Frail-appearing EYES: Pupils equal, round, reactive to light and accommodation. No scleral icterus. Extraocular muscles intact.  HEENT: Head atraumatic, normocephalic. Oropharynx and nasopharynx clear.  NECK:  Supple, no jugular venous distention. No thyroid enlargement, no tenderness.  LUNGS: Normal breath sounds bilaterally, no wheezing, rales,rhonchi or crepitation. No use of accessory muscles of respiration.  CARDIOVASCULAR: S1, S2 normal. No murmurs, rubs, or gallops.  ABDOMEN: Soft, nontender, nondistended. Bowel sounds present. No organomegaly or mass.  EXTREMITIES: No pedal edema, cyanosis, or clubbing.  Diffuse muscular atrophy NEUROLOGIC: Cranial nerves II through XII are  intact.  Global extremity weakness Gait not checked.  PSYCHIATRIC: The patient is alert and oriented x 3.  SKIN: No obvious rash, lesion, or ulcer.   LABORATORY PANEL:   CBC Recent Labs  Lab 02/09/18 1259  WBC 8.8  HGB 11.8*  HCT 37.8  PLT 220  MCV 89.8  MCH 28.0  MCHC 31.2  RDW 17.2*   ------------------------------------------------------------------------------------------------------------------  Chemistries  Recent Labs  Lab 02/09/18 1259  NA 134*  K 4.2  CL 98  CO2 29  GLUCOSE 100*  BUN 30*  CREATININE 0.95  CALCIUM 9.0   ------------------------------------------------------------------------------------------------------------------ estimated creatinine clearance is 37.9 mL/min (by C-G formula based on SCr of 0.95 mg/dL). ------------------------------------------------------------------------------------------------------------------ No results for input(s): TSH, T4TOTAL, T3FREE, THYROIDAB in the last 72 hours.  Invalid input(s): FREET3   Coagulation profile Recent Labs  Lab 02/09/18 1728  INR 1.57    ------------------------------------------------------------------------------------------------------------------- No results for input(s): DDIMER in the last 72 hours. -------------------------------------------------------------------------------------------------------------------  Cardiac Enzymes Recent Labs  Lab 02/09/18 1259  TROPONINI <0.03   ------------------------------------------------------------------------------------------------------------------ Invalid input(s): POCBNP  ---------------------------------------------------------------------------------------------------------------  Urinalysis    Component Value Date/Time   COLORURINE STRAW (A) 02/09/2018 1559   APPEARANCEUR CLEAR (A) 02/09/2018 1559   APPEARANCEUR Clear 09/16/2012 2023   LABSPEC 1.006 02/09/2018 1559   LABSPEC 1.016 09/16/2012 2023   PHURINE 6.0 02/09/2018 1559   GLUCOSEU NEGATIVE 02/09/2018 1559   GLUCOSEU Negative 09/16/2012 2023   HGBUR MODERATE (A) 02/09/2018 1559   BILIRUBINUR NEGATIVE 02/09/2018 1559   BILIRUBINUR Negative 09/16/2012 2023   KETONESUR NEGATIVE 02/09/2018 1559   PROTEINUR NEGATIVE 02/09/2018 1559   NITRITE NEGATIVE 02/09/2018 1559   LEUKOCYTESUR NEGATIVE 02/09/2018 1559   LEUKOCYTESUR 1+ 09/16/2012 2023     RADIOLOGY: Ct Head Wo Contrast  Result Date: 02/09/2018 CLINICAL DATA:  Difficulty with ambulation EXAM: CT HEAD WITHOUT CONTRAST TECHNIQUE: Contiguous axial images were obtained from the base of the skull through the vertex without intravenous contrast. COMPARISON:  09/12/2017 head CT FINDINGS: Brain: No acute territorial infarction, hemorrhage or intracranial mass. Atrophy with mild small vessel ischemic changes of the white matter. Stable ventricle size. Vascular: No hyperdense vessels.  Carotid vascular calcification Skull: Normal. Negative for fracture or focal lesion. Sinuses/Orbits: Mucosal thickening in the ethmoid and maxillary sinuses and left frontal  sinus. Other: None IMPRESSION: 1. No CT evidence for acute intracranial abnormality. 2. Atrophy and mild small vessel ischemic changes of the white matter 3. Paranasal sinus disease Electronically Signed   By: Donavan Foil M.D.   On: 02/09/2018 17:03    EKG: Orders placed or performed during the hospital encounter of 02/09/18  . ED EKG  . ED EKG  . EKG 12-Lead  . EKG 12-Lead    IMPRESSION AND PLAN: *Acute right arm/leg weakness Admit to regular nursing for bed on our CVA versus TIA protocol, neurochecks per routine, on Coumadin, aspirin, neurology to see, check echocardiogram, carotid Dopplers, unable to do MRI of the brain given pacemaker, PT/OT/speech therapy to evaluate/treat, and continue close medical monitoring  *History of sick sinus syndrome Status post pacer Conservative management  *History of paroxysmal A. Fib Stable Continue flecainide, Corgard, Coumadin-pharmacy to dose  *CHF without exacerbation Continue Corgard, Lasix, aspirin, on Coumadin, heart healthy diet   All the records are reviewed and case discussed with ED provider. Management plans discussed with the patient, family and they are in agreement.  CODE STATUS:dnr Code Status History    Date Active Date Inactive Code Status Order ID Comments User Context   11/28/2017  1831 12/01/2017 1434 DNR [id number]  Gorden Harms, MD ED   11/28/2017 1830 11/28/2017 1830 Full Code [id number]  Nathanyl Andujo, Avel Peace, MD ED   11/28/2017 1830 11/28/2017 1830 DNR [id number]  Gorden Harms, MD ED   09/12/2017 1808 09/16/2017 1815 DNR [id number]  Dustin Flock, MD Inpatient   09/12/2017 1611 09/12/2017 1808 DNR [id number]  Dustin Flock, MD ED   08/26/2017 0521 08/27/2017 1640 Full Code [id number]  Lance Coon, MD Inpatient   06/23/2017 1732 06/24/2017 1701 Full Code [id number]  Gladstone Lighter, MD Inpatient   09/23/2016 1825 09/24/2016 1702 Full Code [id number]  Hessie Knows, MD Inpatient    Questions for Most Recent Historical  Code Status (Order [id number])    Question Answer Comment   In the event of cardiac or respiratory ARREST Do not call a "code blue"    In the event of cardiac or respiratory ARREST Do not perform Intubation, CPR, defibrillation or ACLS    In the event of cardiac or respiratory ARREST Use medication by any route, position, wound care, and other measures to relive pain and suffering. May use oxygen, suction and manual treatment of airway obstruction as needed for comfort.    Comments Nurse may pronounce        TOTAL TIME TAKING CARE OF THIS PATIENT: 40 minutes.    Avel Peace Johnita Palleschi M.D on 02/09/2018   Between 7am to 6pm - Pager - [phone]  After 6pm go to [email or web address] - password [email or web address] [facility]  Office  [phone]  CC: Primary care physician; Kirk Ruths, MD   Note: This dictation was prepared with Dragon dictation along with smaller phrase technology. Any transcriptional errors that result from this process are unintentional.

## 2018-02-10 ENCOUNTER — Inpatient Hospital Stay: Payer: Medicare HMO

## 2018-02-10 ENCOUNTER — Inpatient Hospital Stay
Admit: 2018-02-10 | Discharge: 2018-02-10 | Disposition: A | Discharge status: Home / Self Care | Payer: Medicare HMO | Attending: Family Medicine | Admitting: Family Medicine

## 2018-02-10 DIAGNOSIS — I639 Cerebral infarction, unspecified: Principal | ICD-10-CM

## 2018-02-10 LAB — HEMOGLOBIN A1C
Hgb A1c MFr Bld: 6.7 % — ABNORMAL HIGH (ref 4.8–5.6)
Mean Plasma Glucose: 145.59 mg/dL

## 2018-02-10 LAB — LIPID PANEL
Cholesterol: 157 mg/dL (ref 0–200)
HDL: 62 mg/dL (ref >40)
LDL Cholesterol: 83 mg/dL (ref 0–99)
Total CHOL/HDL Ratio: 2.5 RATIO
Triglycerides: 61 mg/dL (ref <150)
VLDL: 12 mg/dL (ref 0–40)

## 2018-02-10 LAB — PROTIME-INR
INR: 1.52
Prothrombin Time: 18.1 seconds — ABNORMAL HIGH (ref 11.4–15.2)

## 2018-02-10 MED ORDER — NADOLOL 40 MG PO TABS: 40.0000 mg | ORAL_TABLET | Freq: Every day | ORAL | 0 refills | Status: DC

## 2018-02-10 MED ORDER — APIXABAN 2.5 MG PO TABS: 2.5000 mg | ORAL_TABLET | Freq: Two times a day (BID) | ORAL | 0 refills | Status: DC

## 2018-02-10 MED ORDER — ASPIRIN EC 81 MG PO TBEC: 81.0000 mg | DELAYED_RELEASE_TABLET | Freq: Every day | ORAL | Status: DC

## 2018-02-10 MED ORDER — APIXABAN 2.5 MG PO TABS: 2.5000 mg | ORAL_TABLET | Freq: Two times a day (BID) | ORAL | Status: DC

## 2018-02-10 NOTE — Care Management Note (Signed)
Case Management Note  Patient Details  Name: Alejandra Wiley MRN: [id number] Date of Birth: 18-May-1932  Subjective/Objective:     Admitted to [facility] with the diagnosis of arm weakness. Lives at [place]   X 7 years. Husband is in long term care at [facility]. Seen Dr, [place] in December.  Daughter is Charise Carwin  [phone]). She lives in [place].  Prescriptions are filled at [place].         Takes care of all basic activities of daily living herself, can't drive anymore. Currently is being seen by [facility]. Would like to continue with [facility]. [place] in August 2019. Skilled Nursing facility in [place] in the past.  Attended the Congestive Heart Failure in the past.,  Last fall was September. Uses cane to aid in ambulation. Decreased appetite. Weight was 134, now 118. Pacemaker in place Neighbor will transport.  Action/Plan: Physical, occupational and speech consults pending.    Expected Discharge Date:  02/10/18               Expected Discharge Plan:     In-House Referral:   yes  Discharge planning Services   yes  Post Acute Care Choice:    Choice offered to:     DME Arranged:    DME Agency:     HH Arranged:    HH Agency:     Status of Service:     If discussed at [employer], dates discussed:    Additional Comments:  Shelbie Ammons, RN MSN CCM Care Management [phone] 02/10/2018, 9:21 AM

## 2018-02-10 NOTE — Progress Notes (Signed)
*  PRELIMINARY RESULTS* Echocardiogram 2D Echocardiogram has been performed.  Sherrie Sport 02/10/2018, 2:12 PM

## 2018-02-10 NOTE — Evaluation (Signed)
Physical Therapy Evaluation Patient Details Name: Alejandra Wiley MRN: [id number] DOB: November 07, 1932 Today's Date: 02/10/2018   History of Present Illness  Pt is an 85 y.o. female presenting to hospital 02/09/18 with difficulty ambulating, R UE weakness/pain, R leg heaviness.  Pt admitted with concern for acute CVA (unable to have MRI d/t pacemaker).  PMH includes h/o kyphoplasty, R TKA with revision, CA, CHF, htn, PAF, SSS, SA node dysfunction, syncope.  Clinical Impression  Prior to hospital admission, pt was independent with ambulation.  Pt lives alone at [facility].  Currently pt is independent with transfers and CGA progressing to SBA ambulating with SPC (pt unsteady ambulating with no AD and improved gait quality and balance noted ambulating with SPC).  R UE and R LE weakness noted.  Pt able to stand and reach just outside BOS forward and to L and R without any loss of balance.  Pt would benefit from skilled PT to address noted impairments and functional limitations (see below for any additional details).  Upon hospital discharge, recommend pt discharge with HHPT and use SPC for mobility (pt reports having SPC at home already).    Follow Up Recommendations Home health PT    Equipment Recommendations  Cane    Recommendations for Other Services OT consult     Precautions / Restrictions Precautions Precautions: Fall Restrictions Weight Bearing Restrictions: No      Mobility  Bed Mobility               General bed mobility comments: Deferred (pt sitting up in chair beginning and end of session)  Transfers Overall transfer level: Independent Equipment used: None             General transfer comment: steady sit to/from stand from recliner  Ambulation/Gait Ambulation/Gait assistance: Min guard;Supervision Gait Distance (Feet): 200 Feet Assistive device: None;Straight cane   Gait velocity: mildly decreased   General Gait Details: pt initially  mildly unsteady ambulating 20 feet with no AD (CGA provided for safety but no overt loss of balance noted) so trialed SPC; improved balance and gait mechanics noted with ambulation using SPC  Stairs            Wheelchair Mobility    Modified Rankin (Stroke Patients Only)       Balance Overall balance assessment: Needs assistance Sitting-balance support: No upper extremity supported;Feet supported Sitting balance-Leahy Scale: Normal Sitting balance - Comments: steady sitting reaching outside BOS   Standing balance support: No upper extremity supported Standing balance-Leahy Scale: Good Standing balance comment: steady standing reaching just outside BOS                             Pertinent Vitals/Pain Pain Assessment: No/denies pain  HR stable and WFL throughout treatment session.    Home Living Family/patient expects to be discharged to:: Private residence([place]) Living Arrangements: Alone             Home Equipment: Cane - single point;Walker - 2 wheels Additional Comments: Pt's husband is in long term care at [facility].    Prior Function Level of Independence: Independent         Comments: Pt's reports h/o falls but nonspecific when asked (only that she had one bad fall last August)     Hand Dominance   Dominant Hand: Right    Extremity/Trunk Assessment   Upper Extremity Assessment Upper Extremity Assessment: Defer to OT evaluation;RUE  deficits/detail;LUE deficits/detail RUE Deficits / Details: shoulder flexion 4/5; elbow flexion/extension 4+/5; good hand grip strength LUE Deficits / Details: L UE ROM and strength WFL    Lower Extremity Assessment Lower Extremity Assessment: RLE deficits/detail;LLE deficits/detail RLE Deficits / Details: hip flexion 3+/5; knee flexion/extension 4/5; DF 4/5; R LE tends to internally rotate at rest sitting in chair RLE Coordination: (limited d/t R LE strength impairments) LLE  Deficits / Details: strength and ROM WFL    Cervical / Trunk Assessment Cervical / Trunk Assessment: Normal  Communication   Communication: No difficulties  Cognition Arousal/Alertness: Awake/alert Behavior During Therapy: WFL for tasks assessed/performed(Verbose) Overall Cognitive Status: Within Functional Limits for tasks assessed                                        General Comments   Nursing cleared pt for participation in physical therapy.  Pt agreeable to PT session.    Exercises  Gait training with [facility]   Assessment/Plan    PT Assessment Patient needs continued PT services  PT Problem List Decreased strength;Decreased balance;Decreased mobility;Decreased knowledge of use of DME;Decreased knowledge of precautions       PT Treatment Interventions DME instruction;Gait training;Functional mobility training;Therapeutic activities;Therapeutic exercise;Balance training;Patient/family education    PT Goals (Current goals can be found in the Care Plan section)  Acute Rehab PT Goals Patient Stated Goal: to improve balance PT Goal Formulation: With patient Time For Goal Achievement: 02/24/18 Potential to Achieve Goals: Good    Frequency 7X/week   Barriers to discharge        Co-evaluation               AM-PAC PT "6 Clicks" Mobility  Outcome Measure Help needed turning from your back to your side while in a flat bed without using bedrails?: None Help needed moving from lying on your back to sitting on the side of a flat bed without using bedrails?: None Help needed moving to and from a bed to a chair (including a wheelchair)?: None Help needed standing up from a chair using your arms (e.g., wheelchair or bedside chair)?: None Help needed to walk in hospital room?: A Little Help needed climbing 3-5 steps with a railing? : A Little 6 Click Score: 22    End of Session Equipment Utilized During Treatment: Gait belt Activity Tolerance: Patient  tolerated treatment well Patient left: in chair;with call bell/phone within reach;with chair alarm set Nurse Communication: Mobility status;Precautions PT Visit Diagnosis: Other abnormalities of gait and mobility (R26.89);Unsteadiness on feet (R26.81);Muscle weakness (generalized) (M62.81)    Time: 1610-9604 PT Time Calculation (min) (ACUTE ONLY): 43 min   Charges:   PT Evaluation $PT Eval Low Complexity: 1 Low PT Treatments $Gait Training: 8-22 mins       Leitha Bleak, PT 02/10/18, 4:34 PM [phone]

## 2018-02-10 NOTE — Discharge Instructions (Signed)
It was so nice to meet you during this hospitalization!   You came into the hospital because you had right arm and leg weakness. We think you had a stroke. This may have been related to your coumadin level being too low. The neurologist switched you to another blood thinner called Eliquis. Please take this twice a day.  Please make sure you follow-up with your primary care doctor and cardiologist.  Take care, Dr. Brett Albino

## 2018-02-10 NOTE — Consult Note (Signed)
ANTICOAGULATION CONSULT NOTE - Follow up [facility] for warfarin dose management Indication: atrial fibrillation  Patient Measurements: Height: 5\' 7"  (170.2 cm) Weight: 122 lb 1.6 oz (55.4 kg) IBW/kg (Calculated) : 61.6   Vital Signs: Temp: 97.6 F (36.4 C) (01/24 0626) Temp Source: Oral (01/24 0626) BP: 147/74 (01/24 0626) Pulse Rate: 69 (01/24 0626)  Labs: Recent Labs    02/09/18 1259 02/09/18 1728 02/10/18 0440  HGB 11.8*  --   --   HCT 37.8  --   --   PLT 220  --   --   LABPROT  --  18.6* 18.1*  INR  --  1.57 1.52  CREATININE 0.95  --   --   TROPONINI <0.03  --   --     Estimated Creatinine Clearance: 39.2 mL/min (by C-G formula based on SCr of 0.95 mg/dL).  Assessment: 85 year-old female admitted today for evaluation of right sided weakness. She takes warfarin 2.5mg  daily PTA with her last dose 1/22. She is noted to have a subtherapeutic INR on admission with no compliance issues. She was admitted here to [facility] back in November 2019 where we managed her warfarin doses with 2-3 mg doses and she did not achieve a therapeutic INR prior to discharge. Evaluation of Care Everywhere shows multiple recent subtherapeutic INRs. There are no new significant DDIs as of this note.  Date  INR Dose 1/23  1.57 -- 1/24 1.52  Goal of Therapy:  INR 2-3 Monitor platelets by anticoagulation protocol: Yes   Plan:  It appears that yesterday's dose was ordered for tonight instead of last night. No dose charted as given on 1/23 (PTA med rec states last dose 1/22). Will continue with order for warfarin 4 mg PO x1 tonight and we will re-evaluate INR tomorrow and daily to adjust dose. Will need CBC at least every three days per policy.   Pharmacy will continue to follow.   Rocky Morel, PharmD, BCPS 02/10/2018,8:04 AM

## 2018-02-10 NOTE — Plan of Care (Signed)
  Problem: Health Behavior/Discharge Planning: Goal: Ability to manage health-related needs will improve Outcome: Progressing   Problem: Clinical Measurements: Goal: Ability to maintain clinical measurements within normal limits will improve Outcome: Progressing Goal: Will remain free from infection Outcome: Progressing Goal: Respiratory complications will improve Outcome: Progressing   Problem: Activity: Goal: Risk for activity intolerance will decrease Outcome: Progressing   Problem: Nutrition: Goal: Adequate nutrition will be maintained Outcome: Progressing   Problem: Coping: Goal: Level of anxiety will decrease Outcome: Progressing   Problem: Elimination: Goal: Will not experience complications related to bowel motility Outcome: Progressing   Problem: Pain Managment: Goal: General experience of comfort will improve Outcome: Progressing   Problem: Safety: Goal: Ability to remain free from injury will improve Outcome: Progressing   Problem: Skin Integrity: Goal: Risk for impaired skin integrity will decrease Outcome: Progressing   Problem: Education: Goal: Knowledge of disease or condition will improve Outcome: Progressing Goal: Knowledge of secondary prevention will improve Outcome: Progressing Goal: Knowledge of patient specific risk factors addressed and post discharge goals established will improve Outcome: Progressing   Problem: Coping: Goal: Will verbalize positive feelings about self Outcome: Progressing   Problem: Health Behavior/Discharge Planning: Goal: Ability to manage health-related needs will improve Outcome: Progressing   Problem: Self-Care: Goal: Ability to communicate needs accurately will improve Outcome: Progressing   Problem: Nutrition: Goal: Risk of aspiration will decrease Outcome: Progressing   Problem: Intracerebral Hemorrhage Tissue Perfusion: Goal: Complications of Intracerebral Hemorrhage will be minimized Outcome:  Progressing   Problem: Ischemic Stroke/TIA Tissue Perfusion: Goal: Complications of ischemic stroke/TIA will be minimized Outcome: Progressing

## 2018-02-10 NOTE — Progress Notes (Signed)
PT Cancellation Note  Patient Details Name: KHANDI KERNES MRN: [id number] DOB: 1932-06-23   Cancelled Treatment:    Reason Eval/Treat Not Completed: Patient at procedure or test/unavailable.  PT consult received.  Chart reviewed.  Transport present to take pt off floor to testing.  Will re-attempt PT evaluation at a later date/time.  Leitha Bleak, PT 02/10/18, 9:19 AM [phone]

## 2018-02-10 NOTE — Progress Notes (Signed)
SLP Cancellation Note  Patient Details Name: Alejandra Wiley MRN: [id number] DOB: April 30, 1932   Cancelled treatment:       Reason Eval/Treat Not Completed: Patient at procedure or test/unavailable  Leroy Sea, MS/CCC- SLP  Lou Miner 02/10/2018, 3:24 PM

## 2018-02-10 NOTE — Discharge Summary (Signed)
[facility] [place] at [place] NAME: Alejandra Wiley    MR#:  [id number]  DATE OF BIRTH:  09/06/1932  DATE OF ADMISSION:  02/09/2018   ADMITTING PHYSICIAN: Gorden Harms, MD  DATE OF DISCHARGE: 02/10/18  PRIMARY CARE PHYSICIAN: Kirk Ruths, MD   ADMISSION DIAGNOSIS:  Right sided weakness [R53.1] DISCHARGE DIAGNOSIS:  Active Problems:   Arm weakness  SECONDARY DIAGNOSIS:   Past Medical History:  Diagnosis Date  . Bradycardia   . 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 CHF (congestive heart failure) ([place])   . Hypertension   . Pacemaker 12/06/2003   Medtronic Impulse [id number]  . Pacemaker 2015  . PAF (paroxysmal atrial fibrillation) ([facility])    a. on Coumadin and flecainide; b. CHADS2VASc 3 (HTN, age x 2, female)  . Sick sinus syndrome ([place])   . Sinoatrial node dysfunction ([facility])   . Vaginal intraepithelial neoplasia    HOSPITAL COURSE:   [name] is an 86 year old female who presented to the ED with right arm and leg weakness. She is on coumadin for atrial fibrillation, but her INR was subtherapeutic in the ED. CT head was negative. She was unable to get an MRI because of her pacemaker. Carotid dopplers were negative. ECHO was performed, but the read was pending at the time of discharge. She was seen by neurology, who changed her coumadin to eliquis 2.5mg  bid. She was discharged home with home health PT/OT.  DISCHARGE CONDITIONS:  Acute CVA History of sick sinus syndrome Paroxysmal atrial fibrillation Chronic diastolic congestive heart failure CONSULTS OBTAINED:  Treatment Team:  Dala Dock, MD Catarina Hartshorn, MD DRUG ALLERGIES:   Allergies  Allergen Reactions  . Latex Other (See Comments)    blisters  . Other Other (See Comments)    Blisters/ paper tape is Market researcher paper tape is Market researcher paper tape is Ok  Camera operator is Ok  . Tape Other (See Comments)    Blisters/ paper tape is  Market researcher paper tape is Ok  . Xarelto [Rivaroxaban] Other (See Comments)    Gums bleeding and too much bruising  . Nickel Rash and Other (See Comments)   DISCHARGE MEDICATIONS:   Allergies as of 02/10/2018      Reactions   Latex Other (See Comments)   blisters   Other Other (See Comments)   Blisters/ paper tape is Market researcher paper tape is Market researcher paper tape is Market researcher paper tape is Ok   Adult nurse (See Comments)   Blisters/ paper tape is Market researcher paper tape is Ok   Xarelto [rivaroxaban] Other (See Comments)   Gums bleeding and too much bruising   Nickel Rash, Other (See Comments)      Medication List    STOP taking these medications   warfarin 1 MG tablet Commonly known as:  COUMADIN     TAKE these medications   acetaminophen 500 MG tablet Commonly known as:  TYLENOL Take 500 mg by mouth every 8 (eight) hours as needed for mild pain or headache.   apixaban 2.5 MG Tabs tablet Commonly known as:  ELIQUIS Take 1 tablet (2.5 mg total) by mouth 2 (two) times daily.   diphenhydrAMINE 25 mg capsule Commonly known as:  BENADRYL Take 25 mg by mouth at bedtime as needed for sleep. Takes along with Tylenol   flecainide 100 MG tablet Commonly known as:  TAMBOCOR Take 1 tablet (100 mg total) by  mouth 2 (two) times daily.   fluticasone 50 MCG/ACT nasal spray Commonly known as:  FLONASE Place 2 sprays into both nostrils daily as needed for allergies or rhinitis.   furosemide 40 MG tablet Commonly known as:  LASIX Take 1 tablet (40 mg total) by mouth daily.   ibandronate 150 MG tablet Commonly known as:  BONIVA Take 150 mg by mouth every 30 (thirty) days. Take in the morning with a full glass of water, on an empty stomach, and do not take anything else by mouth or lie down for the next 30 min.   mirtazapine 15 MG disintegrating tablet Commonly known as:  REMERON SOL-TAB Take 0.5 tablets (7.5 mg total) by mouth at bedtime.   nadolol 40 MG  tablet Commonly known as:  CORGARD Take 1 tablet (40 mg total) by mouth daily.   OVER THE COUNTER MEDICATION Place 1 spray into the nose daily as needed (for nasal congestion). NeilMed Sinus Rinse   Potassium Chloride ER 20 MEQ Tbcr Take 10 mEq by mouth daily.        DISCHARGE INSTRUCTIONS:  1. Follow-up with PCP in 5 days 2. Follow-up with cardiologist in 1-2 weeks 3. STOP warfarin and start Eliquis 2.5mg  bid DIET:  Cardiac diet DISCHARGE CONDITION:  Stable ACTIVITY:  Activity as tolerated OXYGEN:  Home Oxygen: No.  Oxygen Delivery: room air DISCHARGE LOCATION:  home   If you experience worsening of your admission symptoms, develop shortness of breath, life threatening emergency, suicidal or homicidal thoughts you must seek medical attention immediately by calling 911 or calling your MD immediately  if symptoms less severe.  You Must read complete instructions/literature along with all the possible adverse reactions/side effects for all the Medicines you take and that have been prescribed to you. Take any new Medicines after you have completely understood and accpet all the possible adverse reactions/side effects.   Please note  You were cared for by a hospitalist during your hospital stay. If you have any questions about your discharge medications or the care you received while you were in the hospital after you are discharged, you can call the unit and asked to speak with the hospitalist on call if the hospitalist that took care of you is not available. Once you are discharged, your primary care physician will handle any further medical issues. Please note that NO REFILLS for any discharge medications will be authorized once you are discharged, as it is imperative that you return to your primary care physician (or establish a relationship with a primary care physician if you do not have one) for your aftercare needs so that they can reassess your need for medications and  monitor your lab values.    On the day of Discharge:  VITAL SIGNS:  Blood pressure 116/70, pulse 71, temperature 97.8 F (36.6 C), temperature source Oral, resp. rate 18, height 5\' 7"  (1.702 m), weight 55.4 kg, SpO2 91 %. PHYSICAL EXAMINATION:  GENERAL:84 y.o.-year-old patient lying in the bed with no acute distress.Frail-appearing EYES: Pupils equal, round, reactive to light and accommodation. No scleral icterus. Extraocular muscles intact.  HEENT: Head atraumatic, normocephalic. Oropharynx and nasopharynx clear.  NECK: Supple, no jugular venous distention. No thyroid enlargement, no tenderness.  LUNGS: Normal breath sounds bilaterally, no wheezing, rales,rhonchi or crepitation. No use of accessory muscles of respiration.  CARDIOVASCULAR: RRR, S1, S2 normal. No murmurs, rubs, or gallops.  ABDOMEN: Soft, nontender, nondistended. Bowel sounds present. No organomegaly or mass.  EXTREMITIES: No pedal edema, cyanosis, or  clubbing.Diffuse muscular atrophy NEUROLOGIC: Cranial nerves II through XII are intact.5/5 muscle strength throughout all lower extremities, sensation intact throughout. PSYCHIATRIC: The patient is alert and oriented x 3.  SKIN: No obvious rash, lesion, or ulcer.  DATA REVIEW:   CBC Recent Labs  Lab 02/09/18 1259  WBC 8.8  HGB 11.8*  HCT 37.8  PLT 220    Chemistries  Recent Labs  Lab 02/09/18 1259  NA 134*  K 4.2  CL 98  CO2 29  GLUCOSE 100*  BUN 30*  CREATININE 0.95  CALCIUM 9.0     Microbiology Results  Results for orders placed or performed during the hospital encounter of 02/09/18  MRSA PCR Screening     Status: Abnormal   Collection Time: 02/09/18  8:52 PM  Result Value Ref Range Status   MRSA by PCR POSITIVE (A) NEGATIVE Final    Comment:        The GeneXpert MRSA Assay (FDA approved for NASAL specimens only), is one component of a comprehensive MRSA colonization surveillance program. It is not intended to diagnose MRSA infection  nor to guide or monitor treatment for MRSA infections. RESULT CALLED TO, READ BACK BY AND VERIFIED WITH: Charlean Merl @ [id number] 02/09/18 AKT Performed at [facility], [address] [place], [place] [address]     RADIOLOGY:  Dg Elbow 2 Views Right  Result Date: 02/10/2018 CLINICAL DATA:  Pain.  No swelling or bruising.  No prior injury. EXAM: RIGHT ELBOW - 2 VIEW COMPARISON:  No prior. FINDINGS: No acute bony or joint abnormality identified. No evidence of fracture or dislocation. IMPRESSION: No acute abnormality. Electronically Signed   By: Marcello Moores  Register   On: 02/10/2018 14:01   Ct Head Wo Contrast  Result Date: 02/09/2018 CLINICAL DATA:  Difficulty with ambulation EXAM: CT HEAD WITHOUT CONTRAST TECHNIQUE: Contiguous axial images were obtained from the base of the skull through the vertex without intravenous contrast. COMPARISON:  09/12/2017 head CT FINDINGS: Brain: No acute territorial infarction, hemorrhage or intracranial mass. Atrophy with mild small vessel ischemic changes of the white matter. Stable ventricle size. Vascular: No hyperdense vessels.  Carotid vascular calcification Skull: Normal. Negative for fracture or focal lesion. Sinuses/Orbits: Mucosal thickening in the ethmoid and maxillary sinuses and left frontal sinus. Other: None IMPRESSION: 1. No CT evidence for acute intracranial abnormality. 2. Atrophy and mild small vessel ischemic changes of the white matter 3. Paranasal sinus disease Electronically Signed   By: Donavan Foil M.D.   On: 02/09/2018 17:03   US Carotid Bilateral (at [facility] Only)  Result Date: 02/10/2018 CLINICAL DATA:  85 year old female with a history of CVA. EXAM: BILATERAL CAROTID DUPLEX ULTRASOUND TECHNIQUE: Pearline Cables scale imaging, color Doppler and duplex ultrasound were performed of bilateral carotid and vertebral arteries in the neck. COMPARISON:  None. FINDINGS: Criteria: Quantification of carotid stenosis is based on velocity parameters  that correlate the residual internal carotid diameter with NASCET-based stenosis levels, using the diameter of the distal internal carotid lumen as the denominator for stenosis measurement. The following velocity measurements were obtained: RIGHT ICA:  Systolic 75 cm/sec, Diastolic 20 cm/sec CCA:  60 cm/sec SYSTOLIC ICA/CCA RATIO:  1.3 ECA:  91 cm/sec LEFT ICA:  Systolic 782 cm/sec, Diastolic 38 cm/sec CCA:  65 cm/sec SYSTOLIC ICA/CCA RATIO:  1.8 ECA:  73 cm/sec Right Brachial SBP: Not acquired Left Brachial SBP: Not acquired RIGHT CAROTID ARTERY: No significant calcifications of the right common carotid artery. Intermediate waveform maintained. Heterogeneous and partially calcified  plaque at the right carotid bifurcation. No significant lumen shadowing. Low resistance waveform of the right ICA. No significant tortuosity. RIGHT VERTEBRAL ARTERY: Antegrade flow with low resistance waveform. LEFT CAROTID ARTERY: No significant calcifications of the left common carotid artery. Intermediate waveform maintained. Heterogeneous and partially calcified plaque at the left carotid bifurcation without significant lumen shadowing. Low resistance waveform of the left ICA. No significant tortuosity. LEFT VERTEBRAL ARTERY:  Antegrade flow with low resistance waveform. IMPRESSION: Color duplex indicates minimal heterogeneous and calcified plaque, with no hemodynamically significant stenosis by duplex criteria in the extracranial cerebrovascular circulation. Signed, Dulcy Fanny. Dellia Nims, RPVI [facility] Radiology Electronically Signed   By: Corrie Mckusick D.O.   On: 02/10/2018 10:53     Management plans discussed with the patient, family and they are in agreement.  CODE STATUS: DNR   TOTAL TIME TAKING CARE OF THIS PATIENT: 45 minutes.    Berna Spare Amandajo Gonder M.D on 02/10/2018 at 4:22 PM  Between 7am to 6pm - Pager [phone]  After 6pm go to [email or web address] - Clinical research associate [facility]  Office  [phone]  CC: Primary care physician; Kirk Ruths, MD   Note: This dictation was prepared with Dragon dictation along with smaller phrase technology. Any transcriptional errors that result from this process are unintentional.

## 2018-02-10 NOTE — Progress Notes (Signed)
[name]    MR#:  [id number]  DATE OF BIRTH:  [birth year]  SUBJECTIVE:   States she feels about the same today.  She is still having some right arm weakness and endorses right elbow pain that is worse with movement.  Denies any numbness, tingling, vision changes, speech changes.  REVIEW OF SYSTEMS:  Review of Systems  Constitutional: Negative for chills and fever.  HENT: Negative for congestion and sore throat.   Eyes: Negative for blurred vision and double vision.  Respiratory: Negative for cough and shortness of breath.   Cardiovascular: Negative for chest pain and palpitations.  Gastrointestinal: Negative for nausea and vomiting.  Genitourinary: Negative for dysuria and urgency.  Musculoskeletal: Positive for joint pain. Negative for back pain.  Neurological: Positive for focal weakness. Negative for dizziness, tingling, sensory change, speech change and headaches.  Psychiatric/Behavioral: Negative for depression. The patient is not nervous/anxious.    DRUG ALLERGIES:   Allergies  Allergen Reactions  . Latex Other (See Comments)    blisters  . Other Other (See Comments)    Blisters/ paper tape is Market researcher paper tape is Market researcher paper tape is Ok  Camera operator is Ok  . Tape Other (See Comments)    Blisters/ paper tape is Market researcher paper tape is Ok  . Xarelto [Rivaroxaban] Other (See Comments)    Gums bleeding and too much bruising  . Nickel Rash and Other (See Comments)   VITALS:  Blood pressure 116/70, pulse 71, temperature 97.8 F (36.6 C), temperature source Oral, resp. rate 18, height 5\' 7"  (1.702 m), weight 55.4 kg, SpO2 91 %. PHYSICAL EXAMINATION:  Physical Exam  GENERAL:  84 y.o.-year-old patient lying in the bed with no acute distress.  Frail-appearing EYES: Pupils equal, round, reactive to light and accommodation. No scleral icterus. Extraocular muscles intact.    HEENT: Head atraumatic, normocephalic. Oropharynx and nasopharynx clear.  NECK:  Supple, no jugular venous distention. No thyroid enlargement, no tenderness.  LUNGS: Normal breath sounds bilaterally, no wheezing, rales,rhonchi or crepitation. No use of accessory muscles of respiration.  CARDIOVASCULAR: RRR, S1, S2 normal. No murmurs, rubs, or gallops.  ABDOMEN: Soft, nontender, nondistended. Bowel sounds present. No organomegaly or mass.  EXTREMITIES: No pedal edema, cyanosis, or clubbing.  Diffuse muscular atrophy NEUROLOGIC: Cranial nerves II through XII are intact.  5/5 muscle strength throughout all lower extremities, sensation intact throughout. PSYCHIATRIC: The patient is alert and oriented x 3.  SKIN: No obvious rash, lesion, or ulcer.  LABORATORY PANEL:  Female CBC Recent Labs  Lab 02/09/18 1259  WBC 8.8  HGB 11.8*  HCT 37.8  PLT 220   ------------------------------------------------------------------------------------------------------------------ Chemistries  Recent Labs  Lab 02/09/18 1259  NA 134*  K 4.2  CL 98  CO2 29  GLUCOSE 100*  BUN 30*  CREATININE 0.95  CALCIUM 9.0   RADIOLOGY:  Dg Elbow 2 Views Right  Result Date: 02/10/2018 CLINICAL DATA:  Pain.  No swelling or bruising.  No prior injury. EXAM: RIGHT ELBOW - 2 VIEW COMPARISON:  No prior. FINDINGS: No acute bony or joint abnormality identified. No evidence of fracture or dislocation. IMPRESSION: No acute abnormality. Electronically Signed   By: Marcello Moores  Register   On: 02/10/2018 14:01   Ct Head Wo Contrast  Result Date: 02/09/2018 CLINICAL DATA:  Difficulty with ambulation EXAM: CT HEAD WITHOUT CONTRAST TECHNIQUE: Contiguous axial images were obtained from the base of the skull  through the vertex without intravenous contrast. COMPARISON:  09/12/2017 head CT FINDINGS: Brain: No acute territorial infarction, hemorrhage or intracranial mass. Atrophy with mild small vessel ischemic changes of the white matter.  Stable ventricle size. Vascular: No hyperdense vessels.  Carotid vascular calcification Skull: Normal. Negative for fracture or focal lesion. Sinuses/Orbits: Mucosal thickening in the ethmoid and maxillary sinuses and left frontal sinus. Other: None IMPRESSION: 1. No CT evidence for acute intracranial abnormality. 2. Atrophy and mild small vessel ischemic changes of the white matter 3. Paranasal sinus disease Electronically Signed   By: Donavan Foil M.D.   On: 02/09/2018 17:03   US Carotid Bilateral (at [facility] Only)  Result Date: 02/10/2018 CLINICAL DATA:  85 year old female with a history of CVA. EXAM: BILATERAL CAROTID DUPLEX ULTRASOUND TECHNIQUE: Pearline Cables scale imaging, color Doppler and duplex ultrasound were performed of bilateral carotid and vertebral arteries in the neck. COMPARISON:  None. FINDINGS: Criteria: Quantification of carotid stenosis is based on velocity parameters that correlate the residual internal carotid diameter with NASCET-based stenosis levels, using the diameter of the distal internal carotid lumen as the denominator for stenosis measurement. The following velocity measurements were obtained: RIGHT ICA:  Systolic 75 cm/sec, Diastolic 20 cm/sec CCA:  60 cm/sec SYSTOLIC ICA/CCA RATIO:  1.3 ECA:  91 cm/sec LEFT ICA:  Systolic 607 cm/sec, Diastolic 38 cm/sec CCA:  65 cm/sec SYSTOLIC ICA/CCA RATIO:  1.8 ECA:  73 cm/sec Right Brachial SBP: Not acquired Left Brachial SBP: Not acquired RIGHT CAROTID ARTERY: No significant calcifications of the right common carotid artery. Intermediate waveform maintained. Heterogeneous and partially calcified plaque at the right carotid bifurcation. No significant lumen shadowing. Low resistance waveform of the right ICA. No significant tortuosity. RIGHT VERTEBRAL ARTERY: Antegrade flow with low resistance waveform. LEFT CAROTID ARTERY: No significant calcifications of the left common carotid artery. Intermediate waveform maintained. Heterogeneous and  partially calcified plaque at the left carotid bifurcation without significant lumen shadowing. Low resistance waveform of the left ICA. No significant tortuosity. LEFT VERTEBRAL ARTERY:  Antegrade flow with low resistance waveform. IMPRESSION: Color duplex indicates minimal heterogeneous and calcified plaque, with no hemodynamically significant stenosis by duplex criteria in the extracranial cerebrovascular circulation. Signed, Dulcy Fanny. Dellia Nims, RPVI [facility] Rad[facility] Electronically Signed   By: Corrie Mckusick D.O.   On: 02/10/2018 10:53   ASSESSMENT AND PLAN:   Acute right arm/leg weakness- endorses subjective weakness, but no weakness appreciated on exam.  Unable to perform MRI due to pacemaker. -Neurology consulted -Carotid Dopplers unremarkable -Echo read pending -Continue Coumadin.  Patient placed on aspirin on admission, can likely stop this. -PT/OT/SLP evals  History of sick sinus syndrome- s/p pacemaker -Monitor  History of paroxysmal A. Fib- INR subtherapeutic -Continue flecainide and nadolol -Coumadin per pharmacy  Chronic diastolic congestive heart failure-no signs of acute exacerbation -Continue nadolol and Lasix  All the records are reviewed and case discussed with Care Management/Social Worker. Management plans discussed with the patient, family and they are in agreement.  CODE STATUS: DNR  TOTAL TIME TAKING CARE OF THIS PATIENT: 35 minutes.   More than 50% of the time was spent in counseling/coordination of care: YES  POSSIBLE D/C today or tomorrow, DEPENDING ON CLINICAL CONDITION.   Berna Spare Mayo M.D on 02/10/2018 at 2:38 PM  Between 7am to 6pm - Pager [phone]  After 6pm go to [email or web address] - Technical brewer [facility]  Office  [phone]  CC: Primary care physician; Ouida Sills,  Ocie Cornfield, MD  Note: This dictation was prepared with Dragon dictation along with  smaller phrase technology. Any transcriptional errors that result from this process are unintentional.

## 2018-02-10 NOTE — Consult Note (Signed)
Referring Physician: Sela Hua MD    Chief Complaint: Right sided weakness  HPI: Alejandra Wiley is an 85 y.o. female pertinent past medical history of paroxysmal atrial fibrillation on Coumadin and flecainide, sick sinus syndrome, SA node dysfunction, bradycardia, pacemaker placement, CHF and hypertension presenting from [place] with chief complaints of right-sided weakness and gait difficulty.  Patient reports that she has not been feeling well over the past week or so, she describes generalized weakness, fatigue and intolerance to activity.  Per ED records patient was working with physical therapy at her retirement community facility and the physical therapist noticed that she was unusually weak on her right side concerning for possible stroke.  Patient complained of numbness and heaviness in her right leg without associated speech abnormality, cranial nerve deficit,vision disturbance, nausea or vomiting, dizziness, headache or other associated focal neurological deficit. She however endorses feeling of clumsiness and unsteady gait.  Unclear how long she has had the weakness in her right side.  CT head showed no evidence of acute intracranial abnormality.  Unable to obtain MRI of the brain due to pacemaker.  Labs revealed BUN 30, decreased kidney function from baseline, sodium 134, unremarkable CBC, UA negative for UTI, LDL 83, hemoglobin A1c 6.7, INR 1.52.  Patient was therefore admitted for further stroke work-up and management.  Date last known well: Unable to determine Time last known well: Unable to determine tPA Given: No: unable to determine LKW  Past Medical History:  Diagnosis Date  . Bradycardia   . Cancer ([place])    vaginal intraepithelial neoplasia  . CHF (congestive heart failure) ([place])   . Hypertension   . Pacemaker 12/06/2003   Medtronic Impulse DTDR01  . Pacemaker 2015  . PAF (paroxysmal atrial fibrillation) (H[facility])    a. on Coumadin and flecainide;  b. CHADS2VASc 3 (HTN, age x 2, female)  . Sick sinus syndrome ([place])   . Sinoatrial node dysfunction (H[facility])   . Vaginal intraepithelial neoplasia     Past Surgical History:  Procedure Laterality Date  . APPENDECTOMY    . HERNIA REPAIR     lower abd  . IMAGE GUIDED SINUS SURGERY  2015  . INSERT / REPLACE / REMOVE PACEMAKER  12/06/2003   Medtronic impulse DTDR01  . JOINT REPLACEMENT Right    knee  . KYPHOPLASTY N/A 06/23/2017   Procedure: [id number];  Surgeon: Hessie Knows, MD;  Location: [facility] ORS;  Service: Orthopedics;  Laterality: N/A;  . LEEP  1999  . ORIF HIP FRACTURE Right 02/2016  . PACEMAKER GENERATOR CHANGE N/A 09/18/2012   Procedure: PACEMAKER GENERATOR CHANGE;  Surgeon: Deboraha Sprang, MD;  Location[facility] CATH LAB;  Service: Cardiovascular;  Laterality: N/A;  . TONSILLECTOMY    . TOTAL KNEE ARTHROPLASTY    . TOTAL KNEE REVISION Right 09/23/2016   Procedure: TOTAL KNEE REVISION-POLYETHYLENE EXCHANGE;  Surgeon: Hessie Knows, MD;  Location: [facility] ORS;  Service: Orthopedics;  Laterality: Right;  Marland Kitchen VAGINAL HYSTERECTOMY      Family History  Problem Relation Age of Onset  . Stroke Mother   . Heart disease Father   . Diabetes Daughter   . Diabetes Son    Social History:  reports that she has never smoked. She has never used smokeless tobacco. She reports current alcohol use of about 1.0 standard drinks of alcohol per week. She reports that she does not use drugs.  Allergies:  Allergies  Allergen Reactions  . Latex Other (See Comments)    blisters  .  Other Other (See Comments)    Blisters/ paper tape is Market researcher paper tape is Market researcher paper tape is Ok  Camera operator is Ok  . Tape Other (See Comments)    Blisters/ paper tape is Market researcher paper tape is Ok  . Xarelto [Rivaroxaban] Other (See Comments)    Gums bleeding and too much bruising  . Nickel Rash and Other (See Comments)    Medications:  I have reviewed the patient's current  medications. Prior to Admission:  Medications Prior to Admission  Medication Sig Dispense Refill Last Dose  . acetaminophen (TYLENOL) 500 MG tablet Take 500 mg by mouth every 8 (eight) hours as needed for mild pain or headache.   Unknown at PRN  . diphenhydrAMINE (BENADRYL) 25 mg capsule Take 25 mg by mouth at bedtime as needed for sleep. Takes along with Tylenol    Unknown at PRN  . flecainide (TAMBOCOR) 100 MG tablet Take 1 tablet (100 mg total) by mouth 2 (two) times daily. 180 tablet 3 02/09/2018 at 0800  . fluticasone (FLONASE) 50 MCG/ACT nasal spray Place 2 sprays into both nostrils daily as needed for allergies or rhinitis.    Unknown at PRN  . furosemide (LASIX) 40 MG tablet Take 1 tablet (40 mg total) by mouth daily. 30 tablet 0 02/09/2018 at 0800  . ibandronate (BONIVA) 150 MG tablet Take 150 mg by mouth every 30 (thirty) days. Take in the morning with a full glass of water, on an empty stomach, and do not take anything else by mouth or lie down for the next 30 min.   Past Month at Unknown time  . mirtazapine (REMERON SOL-TAB) 15 MG disintegrating tablet Take 0.5 tablets (7.5 mg total) by mouth at bedtime. 30 tablet 0 02/08/2018 at 2000  . nadolol (CORGARD) 40 MG tablet Take 1 tablet (40 mg) by mouth twice daily (Patient taking differently: Take 40 mg by mouth daily. ) 180 tablet 3 02/09/2018 at 0800  . OVER THE COUNTER MEDICATION Place 1 spray into the nose daily as needed (for nasal congestion). [facility] Sinus Rinse    Unknown at PRN  . Potassium Chloride ER 20 MEQ TBCR Take 10 mEq by mouth daily. 90 tablet 2 02/09/2018 at 0800  . warfarin (COUMADIN) 1 MG tablet Take 2 tablets (2 mg total) by mouth at bedtime. (Patient taking differently: Take 2.5 mg by mouth at bedtime. ) 90 tablet 3 02/08/2018 at 2000   Scheduled: . aspirin EC  81 mg Oral Daily  . Chlorhexidine Gluconate Cloth  6 each Topical Q0600  . flecainide  100 mg Oral BID  . furosemide  40 mg Oral Daily  . mirtazapine  7.5 mg Oral  QHS  . mupirocin ointment  1 application Nasal BID  . nadolol  40 mg Oral Daily  . potassium chloride  10 mEq Oral Daily  . warfarin  4 mg Oral ONCE-1800  . Warfarin - Pharmacist Dosing Inpatient   Does not apply q1800    ROS: History obtained from the patient   General ROS: negative for - chills, fatigue, fever, night sweats, weight gain or weight loss Psychological ROS: negative for - behavioral disorder, hallucinations, memory difficulties, mood swings or suicidal ideation Ophthalmic ROS: negative for - blurry vision, double vision, eye pain or loss of vision ENT ROS: negative for - epistaxis, nasal discharge, oral lesions, sore throat, tinnitus or vertigo Allergy and Immunology ROS: negative for - hives or itchy/watery eyes Hematological and Lymphatic ROS: negative  for - bleeding problems, bruising or swollen lymph nodes Endocrine ROS: negative for - galactorrhea, hair pattern changes, polydipsia/polyuria or temperature intolerance Respiratory ROS: negative for - cough, hemoptysis, shortness of breath or wheezing Cardiovascular ROS: negative for - chest pain, dyspnea on exertion, edema or irregular heartbeat Gastrointestinal ROS: negative for - abdominal pain, diarrhea, hematemesis, nausea/vomiting or stool incontinence Genito-Urinary ROS: negative for - dysuria, hematuria, incontinence or urinary frequency/urgency Musculoskeletal ROS: negative for - joint swelling or muscular weakness Neurological ROS: as noted in HPI Dermatological ROS: negative for rash and skin lesion changes  Physical Examination: Blood pressure 116/70, pulse 71, temperature 97.8 F (36.6 C), temperature source Oral, resp. rate 18, height 5\' 7"  (1.702 m), weight 55.4 kg, SpO2 91 %.   HEENT-  Normocephalic, no lesions, without obvious abnormality.  Normal external eye and conjunctiva.  Normal TM's bilaterally.  Normal auditory canals and external ears. Normal external nose, mucus membranes and septum.  Normal  pharynx. Cardiovascular- S1, S2 normal, pulses palpable throughout   Lungs- chest clear, no wheezing, rales, normal symmetric air entry Abdomen- soft, non-tender; bowel sounds normal; no masses,  no organomegaly Extremities- no edema Lymph-no adenopathy palpable Musculoskeletal-no joint tenderness, deformity or swelling Skin-warm and dry, no hyperpigmentation, vitiligo, or suspicious lesions  Neurological Exam   Mental Status: Alert, oriented, thought content appropriate.  Speech fluent without evidence of aphasia.  Able to follow 3 step commands without difficulty. Attention span and concentration seemed appropriate  Cranial Nerves: II: Discs flat bilaterally; Visual fields grossly normal, pupils equal, round, reactive to light and accommodation III,IV, VI: ptosis not present, extra-ocular motions intact bilaterally V,VII: smile symmetric, facial light touch sensation intact VIII: hearing normal bilaterally IX,X: gag reflex present XI: bilateral shoulder shrug XII: midline tongue extension Motor: Right :  Upper extremity   4+/5        Left: Upper extremity   5/5 without pronator drift Right:   Lower extremity   4/5                                          Left: Lower extremity   5/5 Tone and bulk:normal tone throughout; no atrophy noted Sensory: Pinprick and light touch decreased on the right lower extremity Deep Tendon Reflexes: 2+ and symmetric throughout Plantars: Right: mute                              Left: mute Cerebellar: Finger-to-nose testing intact bilaterally. Heel to shin testing normal bilaterally Gait: not tested due to safety concerns  Data Reviewed  Laboratory Studies:  Basic Metabolic Panel: Recent Labs  Lab 02/09/18 1259  NA 134*  K 4.2  CL 98  CO2 29  GLUCOSE 100*  BUN 30*  CREATININE 0.95  CALCIUM 9.0    Liver Function Tests: No results for input(s): AST, ALT, ALKPHOS, BILITOT, PROT, ALBUMIN in the last 168 hours. No results for input(s):  LIPASE, AMYLASE in the last 168 hours. No results for input(s): AMMONIA in the last 168 hours.  CBC: Recent Labs  Lab 02/09/18 1259  WBC 8.8  HGB 11.8*  HCT 37.8  MCV 89.8  PLT 220    Cardiac Enzymes: Recent Labs  Lab 02/09/18 1259  TROPONINI <0.03    BNP: Invalid input(s): POCBNP  CBG: Recent Labs  Lab 02/09/18 1255  GLUCAP 106*  Microbiology: Results for orders placed or performed during the hospital encounter of 02/09/18  MRSA PCR Screening     Status: Abnormal   Collection Time: 02/09/18  8:52 PM  Result Value Ref Range Status   MRSA by PCR POSITIVE (A) NEGATIVE Final    Comment:        The GeneXpert MRSA Assay (FDA approved for NASAL specimens only), is one component of a comprehensive MRSA colonization surveillance program. It is not intended to diagnose MRSA infection nor to guide or monitor treatment for MRSA infections. RESULT CALLED TO, READ BACK BY AND VERIFIED WITH: SYLVIA FUENTES @ 4696 02/09/18 AKT Performed at [facility], [address] [place],  [address]     Coagulation Studies: Recent Labs    02/09/18 1728 02/10/18 0440  LABPROT 18.6* 18.1*  INR 1.57 1.52    Urinalysis:  Recent Labs  Lab 02/09/18 1559  COLORURINE STRAW*  LABSPEC 1.006  PHURINE 6.0  GLUCOSEU NEGATIVE  HGBUR MODERATE*  BILIRUBINUR NEGATIVE  KETONESUR NEGATIVE  PROTEINUR NEGATIVE  NITRITE NEGATIVE  LEUKOCYTESUR NEGATIVE    Lipid Panel:    Component Value Date/Time   CHOL 157 02/10/2018 0440   TRIG 61 02/10/2018 0440   HDL 62 02/10/2018 0440   CHOLHDL 2.5 02/10/2018 0440   VLDL 12 02/10/2018 0440   LDLCALC 83 02/10/2018 0440    HgbA1C:  Lab Results  Component Value Date   HGBA1C 6.7 (H) 02/10/2018    Urine Drug Screen:  No results found for: LABOPIA, COCAINSCRNUR, LABBENZ, AMPHETMU, THCU, LABBARB  Alcohol Level: No results for input(s): ETH in the last 168 hours.  Other results: EKG: unchanged from previous tracings,  Atrial paced rhythm Vent. rate 72 BPM PR interval * ms QRS duration 176 ms QT/QTc 466/510 ms P-R-T axes * -31 163.  Imaging: Ct Head Wo Contrast  Result Date: 02/09/2018 CLINICAL DATA:  Difficulty with ambulation EXAM: CT HEAD WITHOUT CONTRAST TECHNIQUE: Contiguous axial images were obtained from the base of the skull through the vertex without intravenous contrast. COMPARISON:  09/12/2017 head CT FINDINGS: Brain: No acute territorial infarction, hemorrhage or intracranial mass. Atrophy with mild small vessel ischemic changes of the white matter. Stable ventricle size. Vascular: No hyperdense vessels.  Carotid vascular calcification Skull: Normal. Negative for fracture or focal lesion. Sinuses/Orbits: Mucosal thickening in the ethmoid and maxillary sinuses and left frontal sinus. Other: None IMPRESSION: 1. No CT evidence for acute intracranial abnormality. 2. Atrophy and mild small vessel ischemic changes of the white matter 3. Paranasal sinus disease Electronically Signed   By: Donavan Foil M.D.   On: 02/09/2018 17:03   US Carotid Bilateral (at [facility] And [facility] Only)  Result Date: 02/10/2018 CLINICAL DATA:  85 year old female with a history of CVA. EXAM: BILATERAL CAROTID DUPLEX ULTRASOUND TECHNIQUE: Pearline Cables scale imaging, color Doppler and duplex ultrasound were performed of bilateral carotid and vertebral arteries in the neck. COMPARISON:  None. FINDINGS: Criteria: Quantification of carotid stenosis is based on velocity parameters that correlate the residual internal carotid diameter with NASCET-based stenosis levels, using the diameter of the distal internal carotid lumen as the denominator for stenosis measurement. The following velocity measurements were obtained: RIGHT ICA:  Systolic 75 cm/sec, Diastolic 20 cm/sec CCA:  60 cm/sec SYSTOLIC ICA/CCA RATIO:  1.3 ECA:  91 cm/sec LEFT ICA:  Systolic 413 cm/sec, Diastolic 38 cm/sec CCA:  65 cm/sec SYSTOLIC ICA/CCA RATIO:  1.8 ECA:  73 cm/sec Right Brachial  SBP: Not acquired Left Brachial SBP: Not acquired RIGHT  CAROTID ARTERY: No significant calcifications of the right common carotid artery. Intermediate waveform maintained. Heterogeneous and partially calcified plaque at the right carotid bifurcation. No significant lumen shadowing. Low resistance waveform of the right ICA. No significant tortuosity. RIGHT VERTEBRAL ARTERY: Antegrade flow with low resistance waveform. LEFT CAROTID ARTERY: No significant calcifications of the left common carotid artery. Intermediate waveform maintained. Heterogeneous and partially calcified plaque at the left carotid bifurcation without significant lumen shadowing. Low resistance waveform of the left ICA. No significant tortuosity. LEFT VERTEBRAL ARTERY:  Antegrade flow with low resistance waveform. IMPRESSION: Color duplex indicates minimal heterogeneous and calcified plaque, with no hemodynamically significant stenosis by duplex criteria in the extracranial cerebrovascular circulation. Signed, Dulcy Fanny. Dellia Nims, RPVI [facility]iology Electronically Signed   By: Corrie Mckusick D.O.   On: 02/10/2018 10:53   Assessment: 85 y.o. female pertinent past medical history of paroxysmal atrial fibrillation on Coumadin and flecainide, sick sinus syndrome, SA node dysfunction bradycardia, pacemaker placement, CHF and hypertension presenting from [facility] with chief complaints of right-sided weakness and gait difficulty. Concerns remain for possible ischemic event however unable to obtain MRI of the brain due to pacemaker. US carotids show no hemodynamically significant stenosis   Patient reports she has been taking Coumadin consistently as prescribed for atrial fibrillation prior to this event.  INR subtherapeutic 1.52.   Stroke Risk Factors - atrial fibrillation, family history and hypertension  Plan: 1. Prophylactic therapy-Anticoagulation: stop coumadin and  start Eliquis 2.5 mg BID 2. PT consult, OT consult, Speech consult 3. Statin with goal LDL <70 4. Echocardiogram pending 5. NPO until RN stroke swallow screen 6. Telemetry monitoring 7. Frequent neuro checks  This patient was staffed with Dr. Roland Rack who personally evaluated patient, reviewed documentation and agreed with assessment and plan of care as above.  Rufina Falco, DNP, FNP-BC Board certified Nurse Practitioner Neurology Department  02/10/2018, 1:56 PM   I have seen and evaluated the patient. I have reviewed the above note.  She has > 2 weeks of increased R sided weakness. She also has subtheraputic coumadin level at 1.52.   And a history of bleeding gums with xarelto.    It is certainly possible that a small stroke is not showing up on CT, which I have reviewed. She has had consistent difficulty with theraputic coumadin levels, and I would favor changing to a novel anticoagulant.   Given that she has a weight < 60kg and age > 51, she would be dosed at 2.5mg  IBD of eliquis and I would favor this as opposed to continuing coumadin. She had problems in the past with xarelto, but with this dosing, she may do better. She will need to stop coumadin and start eliquis.   Given that I cannot be sure that this is a small embolus, I would also favor statin therapy to target LDL < 70.   No other recommendations at this time, please call with further questions or concerns.   Roland Rack, MD [facility] [phone]  If 7pm- 7am, please page neurology on call as listed in [place].

## 2018-02-10 NOTE — Progress Notes (Signed)
OT Cancellation Note  Patient Details Name: Alejandra Wiley MRN: [id number] DOB: Apr 06, 1932   Cancelled Treatment:    Reason Eval/Treat Not Completed: Patient at procedure or test/ unavailable(Pt. currently at testing. WIll continue to monitor, and eval at a later time, or date.)  Harrel Carina, MS, OTR/L 02/10/2018, 10:26 AM

## 2018-02-12 LAB — ECHOCARDIOGRAM COMPLETE
Height: 67 in
Weight: 1953.6 oz

## 2018-02-13 DIAGNOSIS — I110 Hypertensive heart disease with heart failure: Secondary | ICD-10-CM | POA: Diagnosis not present

## 2018-02-13 DIAGNOSIS — I5032 Chronic diastolic (congestive) heart failure: Secondary | ICD-10-CM | POA: Diagnosis not present

## 2018-02-13 DIAGNOSIS — I480 Paroxysmal atrial fibrillation: Secondary | ICD-10-CM | POA: Diagnosis not present

## 2018-02-13 DIAGNOSIS — R531 Weakness: Secondary | ICD-10-CM | POA: Diagnosis not present

## 2018-02-13 DIAGNOSIS — M6281 Muscle weakness (generalized): Secondary | ICD-10-CM | POA: Diagnosis not present

## 2018-02-13 DIAGNOSIS — E43 Unspecified severe protein-calorie malnutrition: Secondary | ICD-10-CM | POA: Diagnosis not present

## 2018-02-13 DIAGNOSIS — Z87440 Personal history of urinary (tract) infections: Secondary | ICD-10-CM | POA: Diagnosis not present

## 2018-02-13 DIAGNOSIS — Z950 Presence of cardiac pacemaker: Secondary | ICD-10-CM | POA: Diagnosis not present

## 2018-02-13 DIAGNOSIS — R2689 Other abnormalities of gait and mobility: Secondary | ICD-10-CM | POA: Diagnosis not present

## 2018-02-14 ENCOUNTER — Other Ambulatory Visit: Payer: Self-pay | Admitting: *Deleted

## 2018-02-14 NOTE — Patient Outreach (Signed)
[facility]) Care Management  02/14/2018  AVONNE BERKERY 1932/12/31 [id number]   Subjective: Telephone call to patient's home / mobile number, spoke with patient, and HIPAA verified.  Discussed [place] EMMI General Discharge follow up, patient voiced understanding, and is in agreement to follow up.  Patient states remembers getting the EMMI automated follow up calls and explained why she answered the questions the way she answered.   States she caught her little finger of right hand in the flushing mechanism of the toilet while using the bathroom at the hospital on 02/09/2018, small amount of blood at the site, she reported the issue to her nurse at the hospital, and she declined a Band-Aid to the site.  States this finger now has increased redness, no bleeding, has no fever, has history of MRSA, and has set up a follow up appointment with primary MD on 02/15/2018.   States she also has a productive cough, feeling tired, and is planning to discuss all of her signs / symptoms with primary MD during follow up visit.   Patient is aware of signs / symptoms to report to MD and states she will also contact MD if symptoms worsen prior to her follow up visit.  Patient states she has a follow up visit with cardiologist on 02/28/2018.  States she is receiving home health physical therapy and therapy going well.   Patient states she currently lives at [facility] in an independent living [place] and her husband who is 83 years old, is in the skilled nursing portion of the facility.  States she worries about her husband and was prescribed Remeron.   Patient states she receives her medications through [facility] mail order pharmacy,  her Remeron has not been refilled, and last dose given when she was in the hospital on 02/09/2018.   States she is planning to call pharmacy today and follow up on status of refills for Remeron.  States she will also follow up with primary MD regarding Remeron  refill.  Patient states she does not have any education material, EMMI follow up, care management, disease monitoring, transportation, community resource, or pharmacy needs at this time.  Patient states [place] has a lot of resources that she can access if needed and declines [facility] Care Management information at this time.    States her daughter is also a Marine scientist and is aware of available resources if needed.  States she is very appreciative of the follow up.    Objective: Per KPN (Knowledge Performance Now, point of care tool) and chart review, patient hospitalized 02/09/2018 - 02/10/2018 for acute CVA.  Patient also has a history of Bradycardia, vaginal intraepithelial neoplasia (cancer), hypertension, pacemaker 2015, PAF (paroxysmal atrial fibrillation), and Sick sinus syndrome.     Assessment: Received [facility] EMMI General Discharge referral on 02/14/2018.   Red alert times 2, Day #1, patient answered yes to the following question: Unfilled prescriptions?   Patient answered no to the following question: Wounds healing well?    EMMI follow up completed and no further care management needs.     Plan: RNCM will complete case closure due to follow up completed / no care management needs.  RNCM will send MD case closure letter.       Marlenne Ridge H. Annia Friendly, BSN, [place] Management [facility] Telephonic CM Phone: [phone] Fax: [phone]

## 2018-02-15 DIAGNOSIS — Z950 Presence of cardiac pacemaker: Secondary | ICD-10-CM | POA: Diagnosis not present

## 2018-02-15 DIAGNOSIS — I110 Hypertensive heart disease with heart failure: Secondary | ICD-10-CM | POA: Diagnosis not present

## 2018-02-15 DIAGNOSIS — I739 Peripheral vascular disease, unspecified: Secondary | ICD-10-CM | POA: Diagnosis not present

## 2018-02-15 DIAGNOSIS — I5032 Chronic diastolic (congestive) heart failure: Secondary | ICD-10-CM | POA: Diagnosis not present

## 2018-02-15 DIAGNOSIS — I480 Paroxysmal atrial fibrillation: Secondary | ICD-10-CM | POA: Diagnosis not present

## 2018-02-15 DIAGNOSIS — Z9989 Dependence on other enabling machines and devices: Secondary | ICD-10-CM | POA: Diagnosis not present

## 2018-02-15 DIAGNOSIS — I779 Disorder of arteries and arterioles, unspecified: Secondary | ICD-10-CM | POA: Insufficient documentation

## 2018-02-15 DIAGNOSIS — I69051 Hemiplegia and hemiparesis following nontraumatic subarachnoid hemorrhage affecting right dominant side: Secondary | ICD-10-CM | POA: Diagnosis not present

## 2018-02-15 DIAGNOSIS — Z87440 Personal history of urinary (tract) infections: Secondary | ICD-10-CM | POA: Diagnosis not present

## 2018-02-15 DIAGNOSIS — Z7901 Long term (current) use of anticoagulants: Secondary | ICD-10-CM | POA: Diagnosis not present

## 2018-02-15 DIAGNOSIS — I10 Essential (primary) hypertension: Secondary | ICD-10-CM | POA: Diagnosis not present

## 2018-02-15 DIAGNOSIS — E43 Unspecified severe protein-calorie malnutrition: Secondary | ICD-10-CM | POA: Diagnosis not present

## 2018-02-21 DIAGNOSIS — E43 Unspecified severe protein-calorie malnutrition: Secondary | ICD-10-CM | POA: Diagnosis not present

## 2018-02-21 DIAGNOSIS — I5032 Chronic diastolic (congestive) heart failure: Secondary | ICD-10-CM | POA: Diagnosis not present

## 2018-02-21 DIAGNOSIS — Z87440 Personal history of urinary (tract) infections: Secondary | ICD-10-CM | POA: Diagnosis not present

## 2018-02-21 DIAGNOSIS — I69051 Hemiplegia and hemiparesis following nontraumatic subarachnoid hemorrhage affecting right dominant side: Secondary | ICD-10-CM | POA: Diagnosis not present

## 2018-02-21 DIAGNOSIS — I480 Paroxysmal atrial fibrillation: Secondary | ICD-10-CM | POA: Diagnosis not present

## 2018-02-21 DIAGNOSIS — I110 Hypertensive heart disease with heart failure: Secondary | ICD-10-CM | POA: Diagnosis not present

## 2018-02-21 DIAGNOSIS — Z950 Presence of cardiac pacemaker: Secondary | ICD-10-CM | POA: Diagnosis not present

## 2018-02-21 DIAGNOSIS — Z7901 Long term (current) use of anticoagulants: Secondary | ICD-10-CM | POA: Diagnosis not present

## 2018-02-24 DIAGNOSIS — I110 Hypertensive heart disease with heart failure: Secondary | ICD-10-CM | POA: Diagnosis not present

## 2018-02-24 DIAGNOSIS — I480 Paroxysmal atrial fibrillation: Secondary | ICD-10-CM | POA: Diagnosis not present

## 2018-02-24 DIAGNOSIS — Z950 Presence of cardiac pacemaker: Secondary | ICD-10-CM | POA: Diagnosis not present

## 2018-02-24 DIAGNOSIS — Z87440 Personal history of urinary (tract) infections: Secondary | ICD-10-CM | POA: Diagnosis not present

## 2018-02-24 DIAGNOSIS — Z7901 Long term (current) use of anticoagulants: Secondary | ICD-10-CM | POA: Diagnosis not present

## 2018-02-24 DIAGNOSIS — I5032 Chronic diastolic (congestive) heart failure: Secondary | ICD-10-CM | POA: Diagnosis not present

## 2018-02-24 DIAGNOSIS — E43 Unspecified severe protein-calorie malnutrition: Secondary | ICD-10-CM | POA: Diagnosis not present

## 2018-02-24 DIAGNOSIS — I69051 Hemiplegia and hemiparesis following nontraumatic subarachnoid hemorrhage affecting right dominant side: Secondary | ICD-10-CM | POA: Diagnosis not present

## 2018-02-28 ENCOUNTER — Encounter: Payer: Self-pay | Admitting: Internal Medicine

## 2018-02-28 ENCOUNTER — Ambulatory Visit: Payer: Medicare HMO | Admitting: Internal Medicine

## 2018-02-28 VITALS — BP 126/80 | HR 73 | Ht 66.5 in | Wt 121.0 lb

## 2018-02-28 DIAGNOSIS — I480 Paroxysmal atrial fibrillation: Secondary | ICD-10-CM | POA: Diagnosis not present

## 2018-02-28 DIAGNOSIS — I495 Sick sinus syndrome: Secondary | ICD-10-CM

## 2018-02-28 DIAGNOSIS — I5032 Chronic diastolic (congestive) heart failure: Secondary | ICD-10-CM

## 2018-02-28 DIAGNOSIS — Z950 Presence of cardiac pacemaker: Secondary | ICD-10-CM | POA: Diagnosis not present

## 2018-02-28 NOTE — Progress Notes (Signed)
Patient Care Team: Kirk Ruths, MD as PCP - General (Unknown Physician Specialty)   HPI  Alejandra Wiley is a 85 y.o. female seen in followup for palpitations associated with atrial arrhythmias as well as bradycardia requiring backup bradycardia pacing. She takes flecainide;  During kyphoplast  Course complicated by atrial tachycardia with pacing at upper rate limit    Her husband is no longer at home.      Date VP%  3/14 1  3/*16 95  10/17 95  10/18 92-DDD-DDI  6/19 68  10/19 84      DATE TEST EF Vp %    /13 Echo  55-65%    8/19 Echo  50-55% 84%  1/20 Echo   30-35%           Date Cr Hgb  8/18 0.67 12.2  9/18 0.64 10  1/20 0.95 11.8       Hospitalized 8/19 with syncope, thought prob orthostatic with objective evidence variable, ( one day no change, the next day 30 + HR increase.)   Also preceded by atrial fib which might have contributed to assoc weakness and fatigue and excessive rates assoc with walking>> rate response reprogrammed   Flecainide was increased   Pacemaker interrogation >> atrial failure to capture and device reprogrammed; also leading to uninterpretable data re Atrial arrhythmia \ DATE PR interval QRSduration Dose  6/13 AR 268 112 50  6/19 AR 308  VP 138 75  9/19 -- -- 100     1/20 Hospitalized with stroke- R sided weakness.  INR subtherapeutic 1.52.  >>imaging  CT  No acute stroke.  Carotids neg.  Coumadin d/c and apixoban started ( 2.5 mg bid -wt 55 kg;age->82)   She continues to have less dyspnea.  No edema.  No orthopnea.  Fatigue with moderate effort.   Thromboembolic risk factors ( age  -2, HTN-1, TIA/CVA-2, DM-1, CHF-1, Gender-1) for a CHADSVASc Score of >=8    Past Medical History:  Diagnosis Date  . Bradycardia   . 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 CHF (congestive heart failure) ([place])   . Hypertension   . Pacemaker 12/06/2003   Medtronic Impulse DTDR01  . Pacemaker 2015  . PAF (paroxysmal  atrial fibrillation) ([facility])    a. on Coumadin and flecainide; b. CHADS2VASc 3 (HTN, age x 2, female)  . Sick sinus syndrome ([place])   . Sinoatrial node dysfunction ([facility])   . Vaginal intraepithelial neoplasia     Past Surgical History:  Procedure Laterality Date  . APPENDECTOMY    . HERNIA REPAIR     lower abd  . IMAGE GUIDED SINUS SURGERY  2015  . INSERT / REPLACE / REMOVE PACEMAKER  12/06/2003   Medtronic impulse DTDR01  . JOINT REPLACEMENT Right    knee  . KYPHOPLASTY N/A 06/23/2017   Procedure: [id number];  Surgeon: Hessie Knows, MD;  Location: [facility] ORS;  Service: Orthopedics;  Laterality: N/A;  . LEEP  1999  . ORIF HIP FRACTURE Right 02/2016  . PACEMAKER GENERATOR CHANGE N/A 09/18/2012   Procedure: PACEMAKER GENERATOR CHANGE;  Surgeon: Deboraha Sprang, MD;  Location: [facility] CATH LAB;  Service: Cardiovascular;  Laterality: N/A;  . TONSILLECTOMY    . TOTAL KNEE ARTHROPLASTY    . TOTAL KNEE REVISION Right 09/23/2016   Procedure: TOTAL KNEE REVISION-POLYETHYLENE EXCHANGE;  Surgeon: Hessie Knows, MD;  Location: [facility] ORS;  Service: Orthopedics;  Laterality: Right;  Marland Kitchen VAGINAL HYSTERECTOMY  Current Outpatient Medications  Medication Sig Dispense Refill  . acetaminophen (TYLENOL) 500 MG tablet Take 500 mg by mouth every 8 (eight) hours as needed for mild pain or headache.    Marland Kitchen apixaban (ELIQUIS) 2.5 MG TABS tablet Take 1 tablet (2.5 mg total) by mouth 2 (two) times daily. 60 tablet 0  . diphenhydrAMINE (BENADRYL) 25 mg capsule Take 25 mg by mouth at bedtime as needed for sleep. Takes along with Tylenol     . flecainide (TAMBOCOR) 100 MG tablet Take 1 tablet (100 mg total) by mouth 2 (two) times daily. 180 tablet 3  . fluticasone (FLONASE) 50 MCG/ACT nasal spray Place 2 sprays into both nostrils daily as needed for allergies or rhinitis.     . furosemide (LASIX) 40 MG tablet Take 1 tablet (40 mg total) by mouth daily. 30 tablet 0  . ibandronate (BONIVA) 150 MG tablet Take 150 mg by mouth  every 30 (thirty) days. Take in the morning with a full glass of water, on an empty stomach, and do not take anything else by mouth or lie down for the next 30 min.    . nadolol (CORGARD) 40 MG tablet Take 1 tablet (40 mg total) by mouth daily. 30 tablet 0  . OVER THE COUNTER MEDICATION Place 1 spray into the nose daily as needed (for nasal congestion). NeilMed Sinus Rinse     . Potassium Chloride ER 20 MEQ TBCR Take 10 mEq by mouth daily. 90 tablet 2  . mirtazapine (REMERON SOL-TAB) 15 MG disintegrating tablet Take 0.5 tablets (7.5 mg total) by mouth at bedtime. 30 tablet 0   No current facility-administered medications for this visit.     Allergies  Allergen Reactions  . Latex Other (See Comments)    blisters  . Other Other (See Comments)    Blisters/ paper tape is Market researcher paper tape is Market researcher paper tape is Ok  Camera operator is Ok  . Tape Other (See Comments)    Blisters/ paper tape is Market researcher paper tape is Ok  . Xarelto [Rivaroxaban] Other (See Comments)    Gums bleeding and too much bruising  . Nickel Rash and Other (See Comments)    Review of Systems negative except from HPI and PMH  Physical Exam BP 126/80 (BP Location: Left Arm, Patient Position: Sitting, Cuff Size: Normal)   Pulse 73   Ht 5' 6.5" (1.689 m)   Wt 121 lb (54.9 kg)   BMI 19.24 kg/m  Well developed and nourished in no acute distress HENT normal Neck supple with JVP-flat Clear Regular rate and rhythm, no murmurs or gallops Abd-soft with active BS No Clubbing cyanosis edema Skin-warm and dry A & Oriented  Grossly normal sensory and motor function    ECG      Assessment and  Plan  Atrial arrhythmia/Fib  Sinus node dysfunction  Hypertension  Syncope  Pacemaker  Medtronic  Dual chamber   Elevated pacing thresholds A Greater    HFpEF>> LV dysfunction new  Orthostatic hypotension   Much improved.    Her LV function is considerably worse than previously.  I do  not know if this is related to the stroke or not.  We will plan to repeat it in a couple of months.  Her nadolol was increased.  This is been associated with still mildly persistent elevated systolic blood pressure.  Continues with orthostatic lightheadedness but this is no worse.  Reviewed physiology and isometric contraction.  Euvolemic.  Continue  current diuretics.  No bleeding.  Less being on apixaban so she can eat spinach.    We spent more than 50% of our >25 min visit in face to face counseling regarding the above

## 2018-02-28 NOTE — Patient Instructions (Signed)
Medication Instructions:  - Your physician recommends that you continue on your current medications as directed. Please refer to the Current Medication list given to you today.  If you need a refill on your cardiac medications before your next appointment, please call your pharmacy.   Lab work: - none ordered  If you have labs (blood work) drawn today and your tests are completely normal, you will receive your results only by: Marland Kitchen MyChart Message (if you have MyChart) OR . A paper copy in the mail If you have any lab test that is abnormal or we need to change your treatment, we will call you to review the results.  Testing/Procedures: - none ordered  Follow-Up: At [facility], you and your health needs are our priority.  As part of our continuing mission to provide you with exceptional heart care, we have created designated Provider Care Teams.  These Care Teams include your primary Cardiologist (physician) and Advanced Practice Providers (APPs -  Physician Assistants and Nurse Practitioners) who all work together to provide you with the care you need, when you need it. Marland Kitchen as scheduled with Dr. Caryl Comes on 05/09/18   Remote monitoring is used to monitor your Pacemaker of ICD from home. This monitoring reduces the number of office visits required to check your device to one time per year. It allows [place] to keep an eye on the functioning of your device to ensure it is working properly. You are scheduled for a device check from home on 04/20/18. You may send your transmission at any time that day. If you have a wireless device, the transmission will be sent automatically. After your physician reviews your transmission, you will receive a postcard with your next transmission date.   Any Other Special Instructions Will Be Listed Below (If Applicable). - N/A

## 2018-03-02 ENCOUNTER — Other Ambulatory Visit: Payer: Self-pay | Admitting: *Deleted

## 2018-03-03 DIAGNOSIS — Z7901 Long term (current) use of anticoagulants: Secondary | ICD-10-CM | POA: Diagnosis not present

## 2018-03-03 DIAGNOSIS — E43 Unspecified severe protein-calorie malnutrition: Secondary | ICD-10-CM | POA: Diagnosis not present

## 2018-03-03 DIAGNOSIS — I5032 Chronic diastolic (congestive) heart failure: Secondary | ICD-10-CM | POA: Diagnosis not present

## 2018-03-03 DIAGNOSIS — Z950 Presence of cardiac pacemaker: Secondary | ICD-10-CM | POA: Diagnosis not present

## 2018-03-03 DIAGNOSIS — Z87440 Personal history of urinary (tract) infections: Secondary | ICD-10-CM | POA: Diagnosis not present

## 2018-03-03 DIAGNOSIS — I480 Paroxysmal atrial fibrillation: Secondary | ICD-10-CM | POA: Diagnosis not present

## 2018-03-03 DIAGNOSIS — I110 Hypertensive heart disease with heart failure: Secondary | ICD-10-CM | POA: Diagnosis not present

## 2018-03-03 DIAGNOSIS — I69051 Hemiplegia and hemiparesis following nontraumatic subarachnoid hemorrhage affecting right dominant side: Secondary | ICD-10-CM | POA: Diagnosis not present

## 2018-03-03 MED ORDER — APIXABAN 2.5 MG PO TABS: 2.5000 mg | ORAL_TABLET | Freq: Two times a day (BID) | ORAL | 1 refills | Status: DC

## 2018-03-07 DIAGNOSIS — I5032 Chronic diastolic (congestive) heart failure: Secondary | ICD-10-CM | POA: Diagnosis not present

## 2018-03-07 DIAGNOSIS — Z87440 Personal history of urinary (tract) infections: Secondary | ICD-10-CM | POA: Diagnosis not present

## 2018-03-07 DIAGNOSIS — I480 Paroxysmal atrial fibrillation: Secondary | ICD-10-CM | POA: Diagnosis not present

## 2018-03-07 DIAGNOSIS — Z7901 Long term (current) use of anticoagulants: Secondary | ICD-10-CM | POA: Diagnosis not present

## 2018-03-07 DIAGNOSIS — Z950 Presence of cardiac pacemaker: Secondary | ICD-10-CM | POA: Diagnosis not present

## 2018-03-07 DIAGNOSIS — E43 Unspecified severe protein-calorie malnutrition: Secondary | ICD-10-CM | POA: Diagnosis not present

## 2018-03-07 DIAGNOSIS — I69051 Hemiplegia and hemiparesis following nontraumatic subarachnoid hemorrhage affecting right dominant side: Secondary | ICD-10-CM | POA: Diagnosis not present

## 2018-03-07 DIAGNOSIS — I110 Hypertensive heart disease with heart failure: Secondary | ICD-10-CM | POA: Diagnosis not present

## 2018-03-31 ENCOUNTER — Encounter: Payer: Self-pay | Admitting: Pharmacist

## 2018-03-31 ENCOUNTER — Other Ambulatory Visit: Payer: Self-pay

## 2018-03-31 ENCOUNTER — Encounter: Payer: Self-pay | Admitting: Family

## 2018-03-31 ENCOUNTER — Ambulatory Visit: Payer: Medicare HMO | Admitting: Family

## 2018-03-31 ENCOUNTER — Ambulatory Visit: Payer: Medicare HMO | Attending: Family | Admitting: Family

## 2018-03-31 VITALS — BP 129/83 | HR 80 | Resp 18 | Ht 67.0 in | Wt 122.4 lb

## 2018-03-31 DIAGNOSIS — Z8544 Personal history of malignant neoplasm of other female genital organs: Secondary | ICD-10-CM | POA: Insufficient documentation

## 2018-03-31 DIAGNOSIS — I110 Hypertensive heart disease with heart failure: Secondary | ICD-10-CM | POA: Diagnosis not present

## 2018-03-31 DIAGNOSIS — I10 Essential (primary) hypertension: Secondary | ICD-10-CM

## 2018-03-31 DIAGNOSIS — Z8249 Family history of ischemic heart disease and other diseases of the circulatory system: Secondary | ICD-10-CM | POA: Insufficient documentation

## 2018-03-31 DIAGNOSIS — Z79899 Other long term (current) drug therapy: Secondary | ICD-10-CM | POA: Diagnosis not present

## 2018-03-31 DIAGNOSIS — I480 Paroxysmal atrial fibrillation: Secondary | ICD-10-CM

## 2018-03-31 DIAGNOSIS — Z888 Allergy status to other drugs, medicaments and biological substances status: Secondary | ICD-10-CM | POA: Diagnosis not present

## 2018-03-31 DIAGNOSIS — I5022 Chronic systolic (congestive) heart failure: Secondary | ICD-10-CM | POA: Insufficient documentation

## 2018-03-31 DIAGNOSIS — Z7901 Long term (current) use of anticoagulants: Secondary | ICD-10-CM | POA: Diagnosis not present

## 2018-03-31 DIAGNOSIS — Z950 Presence of cardiac pacemaker: Secondary | ICD-10-CM | POA: Diagnosis not present

## 2018-03-31 DIAGNOSIS — Z8673 Personal history of transient ischemic attack (TIA), and cerebral infarction without residual deficits: Secondary | ICD-10-CM | POA: Diagnosis not present

## 2018-03-31 DIAGNOSIS — I495 Sick sinus syndrome: Secondary | ICD-10-CM | POA: Diagnosis not present

## 2018-03-31 NOTE — Progress Notes (Signed)
[facility] - PHARMACIST COUNSELING NOTE  ADHERENCE ASSESSMENT  Adherence strategy: pill box   Do you ever forget to take your medication? [] Yes (1) [x] No (0)  Do you ever skip doses due to side effects? [] Yes (1) [x] No (0)  Do you have trouble affording your medicines? [] Yes (1) [x] No (0)  Are you ever unable to pick up your medication due to transportation difficulties? [] Yes (1) [x] No (0)  Do you ever stop taking your medications because you don't believe they are helping? [] Yes (1) [x] No (0)  Total score _0______    Recommendations given to patient about increasing adherence: None. Good compliance with current adherence strategy. Medications vial mail order.  Guideline-Directed Medical Therapy/Evidence Based Medicine    ACE/ARB/ARNI: none   Beta Blocker: nadolol 40 mg daily   Aldosterone Antagonist: none Diuretic: furosemide 40 mg daily    SUBJECTIVE   HPI: Here for follow up visit. Weighing and recording BP daily. Warfarin recently switched to apixaban, which patient is happy to not have INR checked anymore.  Past Medical History:  Diagnosis Date  . Bradycardia   . Cancer ([address])    vaginal intraepithelial neoplasia  . CHF (congestive heart failure) ([place])   . Hypertension   . Pacemaker 12/06/2003   Medtronic Impulse DTDR01  . Pacemaker 2015  . PAF (paroxysmal atrial fibrillation) ([facility])    a. on Coumadin and flecainide; b. CHADS2VASc 3 (HTN, age x 2, female)  . Sick sinus syndrome ([place])   . Sinoatrial node dysfunction ([facility])   . Stroke ([place])   . Vaginal intraepithelial neoplasia         OBJECTIVE    Vital signs: HR 80, BP 129/83, weight (pounds) 122.6  ECHO: Date 02/10/18, EF 30-35%, notes EF reduced from ECHO 09/14/17 where EF 50-55%    BMP Latest Ref Rng & Units 02/09/2018 11/30/2017 11/29/2017  Glucose 70 - 99 mg/dL 100(H) 115(H) 173(H)  BUN 8 - 23 mg/dL 30(H) 23 22  Creatinine 0.44 - 1.00 mg/dL 0.95 0.82 0.72   BUN/Creat Ratio 12 - 28 - - -  Sodium 135 - 145 mmol/L 134(L) 135 133(L)  Potassium 3.5 - 5.1 mmol/L 4.2 3.7 4.3  Chloride 98 - 111 mmol/L 98 99 102  CO2 22 - 32 mmol/L 29 25 20(L)  Calcium 8.9 - 10.3 mg/dL 9.0 8.4(L) 8.4(L)    ASSESSMENT 85 year old female with HFrEF. EF 30-35% reduced from previous ECHO 09/14/17 where EF was 50-55%. Cardiology unsure if significant change in LV function related to stroke. Plan to repeat ECHO in a few months. Patient is weighing daily with no significant change in weight. BP today WNL.   PLAN Unclear why EF significantly reduced from previous. If repeat ECHO still shows reduced EF, would consider optimization of HF regimen as BP allows. Patient is currently not on any evidence-based medication for HF.    Time spent: 10 minutes  [place], Pharm.D. 03/31/2018 12:06 PM    Current Outpatient Medications:  .  acetaminophen (TYLENOL) 500 MG tablet, Take 500 mg by mouth every 8 (eight) hours as needed for mild pain or headache., Disp: , Rfl:  .  apixaban (ELIQUIS) 2.5 MG TABS tablet, Take 1 tablet (2.5 mg total) by mouth 2 (two) times daily., Disp: 180 tablet, Rfl: 1 .  flecainide (TAMBOCOR) 100 MG tablet, Take 1 tablet (100 mg total) by mouth 2 (two) times daily., Disp: 180 tablet, Rfl: 3 .  fluticasone (FLONASE) 50 MCG/ACT nasal  spray, Place 2 sprays into both nostrils daily as needed for allergies or rhinitis. , Disp: , Rfl:  .  furosemide (LASIX) 40 MG tablet, Take 1 tablet (40 mg total) by mouth daily., Disp: 30 tablet, Rfl: 0 .  ibandronate (BONIVA) 150 MG tablet, Take 150 mg by mouth every 30 (thirty) days. Take in the morning with a full glass of water, on an empty stomach, and do not take anything else by mouth or lie down for the next 30 min., Disp: , Rfl:  .  mirtazapine (REMERON SOL-TAB) 15 MG disintegrating tablet, Take 0.5 tablets (7.5 mg total) by mouth at bedtime. (Patient not taking: Reported on 03/31/2018), Disp: 30 tablet, Rfl: 0 .   nadolol (CORGARD) 40 MG tablet, Take 1 tablet (40 mg total) by mouth daily., Disp: 30 tablet, Rfl: 0 .  OVER THE COUNTER MEDICATION, Place 1 spray into the nose daily as needed (for nasal congestion). NeilMed Sinus Rinse , Disp: , Rfl:  .  Potassium Chloride ER 20 MEQ TBCR, Take 10 mEq by mouth daily., Disp: 90 tablet, Rfl: 2   COUNSELING POINTS/CLINICAL PEARLS Furosemide  Drug causes sun-sensitivity. Advise patient to use sunscreen and avoid tanning beds. Patient should avoid activities requiring coordination until drug effects are realized, as drug may cause dizziness, vertigo, or blurred vision. This drug may cause hyperglycemia, hyperuricemia, constipation, diarrhea, loss of appetite, nausea, vomiting, purpuric disorder, cramps, spasticity, asthenia, headache, paresthesia, or scaling eczema. Instruct patient to report unusual bleeding/bruising or signs/symptoms of hypotension, infection, pancreatitis, or ototoxicity (tinnitus, hearing impairment). Advise patient to report signs/symptoms of a severe skin reactions (flu-like symptoms, spreading red rash, or skin/mucous membrane blistering) or erythema multiforme. Instruct patient to eat high-potassium foods during drug therapy, as directed by healthcare professional.  Patient should not drink alcohol while taking this drug.   DRUGS TO AVOID IN HEART FAILURE  Drug or Class Mechanism  Analgesics . NSAIDs . COX-2 inhibitors . Glucocorticoids  Sodium and water retention, increased systemic vascular resistance, decreased response to diuretics   Diabetes Medications . Metformin . Thiazolidinediones o Rosiglitazone (Avandia) o Pioglitazone (Actos) . DPP4 Inhibitors o Saxagliptin (Onglyza) o Sitagliptin (Januvia)   Lactic acidosis Possible calcium channel blockade   Unknown  Antiarrhythmics . Class I  o Flecainide o Disopyramide . Class III o Sotalol . Other o Dronedarone  Negative inotrope, proarrhythmic   Proarrhythmic,  beta blockade  Negative inotrope  Antihypertensives . Alpha Blockers o Doxazosin . Calcium Channel Blockers o Diltiazem o Verapamil o Nifedipine . Central Alpha Adrenergics o Moxonidine . Peripheral Vasodilators o Minoxidil  Increases renin and aldosterone  Negative inotrope    Possible sympathetic withdrawal  Unknown  Anti-infective . Itraconazole . Amphotericin B  Negative inotrope Unknown  Hematologic . Anagrelide . Cilostazol   Possible inhibition of PD IV Inhibition of PD III causing arrhythmias  Neurologic/Psychiatric . Stimulants . Anti-Seizure Drugs o Carbamazepine o Pregabalin . Antidepressants o Tricyclics o Citalopram . Parkinsons o Bromocriptine o Pergolide o Pramipexole . Antipsychotics o Clozapine . Antimigraine o Ergotamine o Methysergide . Appetite suppressants . Bipolar o Lithium  Peripheral alpha and beta agonist activity  Negative inotrope and chronotrope Calcium channel blockade  Negative inotrope, proarrhythmic Dose-dependent QT prolongation  Excessive serotonin activity/valvular damage Excessive serotonin activity/valvular damage Unknown  IgE mediated hypersensitivy, calcium channel blockade  Excessive serotonin activity/valvular damage Excessive serotonin activity/valvular damage Valvular damage  Direct myofibrillar degeneration, adrenergic stimulation  Antimalarials . Chloroquine . Hydroxychloroquine Intracellular inhibition of lysosomal enzymes  Urologic Agents .  Alpha Blockers o Doxazosin o Prazosin o Tamsulosin o Terazosin  Increased renin and aldosterone  Adapted from Page RL, et al. "Drugs That May Cause or Exacerbate Heart Failure: A Scientific Statement from the [place]." Circulation 2016; 726:O03-T59. DOI: 10.1161/CIR.0000000000000426   MEDICATION ADHERENCES TIPS AND STRATEGIES 1. Taking medication as prescribed improves patient outcomes in heart failure (reduces  hospitalizations, improves symptoms, increases survival) 2. Side effects of medications can be managed by decreasing doses, switching agents, stopping drugs, or adding additional therapy. Please let someone in the [facility] know if you have having bothersome side effects so we can modify your regimen. Do not alter your medication regimen without talking to [place].  3. Medication reminders can help patients remember to take drugs on time. If you are missing or forgetting doses you can try linking behaviors, using pill boxes, or an electronic reminder like an alarm on your phone or an app. Some people can also get automated phone calls as medication reminders.

## 2018-03-31 NOTE — Patient Instructions (Signed)
Continue weighing daily and call for an overnight weight gain of > 2 pounds or a weekly weight gain of >5 pounds. 

## 2018-03-31 NOTE — Progress Notes (Signed)
Patient ID: Alejandra Wiley, female    DOB: [birth year], 85 y.o.   MRN: [id number]  HPI  Alejandra Wiley is a 85 y/o female with a history of vaginal cancer, sick sinus syndrome, paroxysmal atrial fibrillation, HTN and chronic heart failure.   Echo report from 02/10/2018 reviewed and showed an EF of 30-35% without clot. Echo report from 09/14/17 reviewed and showed an EF of 50-55% along with mod/severe MR and mild/mod TR with atrial fibrillation. Echo report from 06/24/17 reviewed and showed an EF of 50-55% along with mild/moderate MR and a mildly elevated PA pressure of 44 mm Hg.   Admitted 02/09/2018 due to acute stroke. Neurology consult obtained. Head CT & carotid dopplers were negative. Unable to get an MRI due to her pacemaker. Anticoagulant changed to eliquis and she was discharged the next day. Admitted 11/28/17 due to E. Coli UTI along with acute on chronic HF. Given IV antibiotics and then transitioned to oral antibiotics. Given IV diuretics initially and then transitioned to oral diuretics. Discharged after 3 days. Admitted 09/12/17 due to syncope. Cardiology and EP consults obtained. Negative fractures or bleeding in the brain. No pulmonary embolus. Pacemaker was reprogrammed. Discharged to SNF after 4 days for rehab. Admitted 08/26/17 due to acute pyelonephritis. Initially given IV antibiotics. Head CT was negative. Discharged the next day. Admitted 06/23/17 due to atrial tachycardia and pulmonary edema after kyphoplasty of T-9 on 06/23/17. Cardiology consult obtained. Discharged the following day.   She presents today for a follow-up visit with a chief complaint of moderate fatigue upon minimal exertion. She says that this has been present for several years. She has associated head congestion, shortness of breath, light-headedness, tremors, weakness, palpitations and chronic difficulty sleeping along with this. She denies any abdominal distention, chest pain, pedal edema or weight gain.   Past Medical  History:  Diagnosis Date  . Bradycardia   . Cancer ([place])    vaginal intraepithelial neoplasia  . CHF (congestive heart failure) ([place])   . Hypertension   . Pacemaker 12/06/2003   Medtronic Impulse DTDR01  . Pacemaker 2015  . PAF (paroxysmal atrial fibrillation) (H[facility])    a. on Coumadin and flecainide; b. CHADS2VASc 3 (HTN, age x 2, female)  . Sick sinus syndrome ([place])   . Sinoatrial node dysfunction (HCC)   . Stroke ([place])   . Vaginal intraepithelial neoplasia    Past Surgical History:  Procedure Laterality Date  . APPENDECTOMY    . HERNIA REPAIR     lower abd  . IMAGE GUIDED SINUS SURGERY  2015  . INSERT / REPLACE / REMOVE PACEMAKER  12/06/2003   Medtronic impulse DTDR01  . JOINT REPLACEMENT Right    knee  . KYPHOPLASTY N/A 06/23/2017   Procedure: [id number];  Surgeon: Hessie Knows, MD;  Location: [facility] ORS;  Service: Orthopedics;  Laterality: N/A;  . LEEP  1999  . ORIF HIP FRACTURE Right 02/2016  . PACEMAKER GENERATOR CHANGE N/A 09/18/2012   Procedure: PACEMAKER GENERATOR CHANGE;  Surgeon: Deboraha Sprang, MD;  Location: [facility] CATH LAB;  Service: Cardiovascular;  Laterality: N/A;  . TONSILLECTOMY    . TOTAL KNEE ARTHROPLASTY    . TOTAL KNEE REVISION Right 09/23/2016   Procedure: TOTAL KNEE REVISION-POLYETHYLENE EXCHANGE;  Surgeon: Hessie Knows, MD;  Location: [facility] ORS;  Service: Orthopedics;  Laterality: Right;  Marland Kitchen VAGINAL HYSTERECTOMY     Family History  Problem Relation Age of Onset  . Stroke Mother   . Heart disease Father   . Diabetes  Daughter   . Diabetes Son    Social History   Tobacco Use  . Smoking status: Never Smoker  . Smokeless tobacco: Never Used  Substance Use Topics  . Alcohol use: Yes    Alcohol/week: 1.0 standard drinks    Types: 1 Glasses of wine per week    Comment: occasionally   Allergies  Allergen Reactions  . Latex Other (See Comments)    blisters  . Other Other (See Comments)    Blisters/ paper tape is Market researcher paper tape is  Market researcher paper tape is Ok  Camera operator is Ok  . Tape Other (See Comments)    Blisters/ paper tape is Market researcher paper tape is Ok  . Xarelto [Rivaroxaban] Other (See Comments)    Gums bleeding and too much bruising  . Nickel Rash and Other (See Comments)   Prior to Admission medications   Medication Sig Start Date End Date Taking? Authorizing Provider  acetaminophen (TYLENOL) 500 MG tablet Take 500 mg by mouth every 8 (eight) hours as needed for mild pain or headache.   Yes [provider]  apixaban (ELIQUIS) 2.5 MG TABS tablet Take 1 tablet (2.5 mg total) by mouth 2 (two) times daily. 03/03/18  Yes Deboraha Sprang, MD  flecainide (TAMBOCOR) 100 MG tablet Take 1 tablet (100 mg total) by mouth 2 (two) times daily. 11/22/17  Yes Deboraha Sprang, MD  fluticasone [facility]) 50 MCG/ACT nasal spray Place 2 sprays into both nostrils daily as needed for allergies or rhinitis.    Yes [provider]  furosemide (LASIX) 40 MG tablet Take 1 tablet (40 mg total) by mouth daily. 12/01/17  Yes Sudini, Alveta Heimlich, MD  ibandronate (BONIVA) 150 MG tablet Take 150 mg by mouth every 30 (thirty) days. Take in the morning with a full glass of water, on an empty stomach, and do not take anything else by mouth or lie down for the next 30 min.   Yes [provider]  nadolol (CORGARD) 40 MG tablet Take 1 tablet (40 mg total) by mouth daily. 02/10/18  Yes Mayo, Pete Pelt, MD  OVER THE COUNTER MEDICATION Place 1 spray into the nose daily as needed (for nasal congestion). NeilMed Sinus Rinse    Yes [provider]  Potassium Chloride ER 20 MEQ TBCR Take 10 mEq by mouth daily. 01/31/18  Yes Deboraha Sprang, MD  mirtazapine (REMERON SOL-TAB) 15 MG disintegrating tablet Take 0.5 tablets (7.5 mg total) by mouth at bedtime. Patient not taking: Reported on 03/31/2018 12/01/17 02/09/18  Hillary Bow, MD    Review of Systems  Constitutional: Positive for fatigue (tire easily).  Negative for appetite change.  HENT: Positive for congestion (sinus congestion). Negative for postnasal drip and sore throat.   Eyes: Negative.   Respiratory: Positive for shortness of breath (with minimal exertion). Negative for chest tightness.   Cardiovascular: Positive for palpitations (at times). Negative for chest pain and leg swelling.  Gastrointestinal: Negative for abdominal distention, abdominal pain, nausea and vomiting.  Endocrine: Negative.   Genitourinary: Negative.   Musculoskeletal: Positive for arthralgias (right leg at times) and back pain.  Skin: Negative.   Allergic/Immunologic: Negative.   Neurological: Positive for tremors, weakness (right leg) and light-headedness. Negative for dizziness.  Hematological: Negative for adenopathy. Does not bruise/bleed easily.  Psychiatric/Behavioral: Positive for sleep disturbance (sleeping 8-9 hours/ night along with 2 naps daily). Negative for dysphoric mood. The patient is nervous/anxious.    Vitals:   03/31/18 1059  BP: 129/83  Pulse: 80  Resp: 18  SpO2: 100%  Weight: 122 lb 6 oz (55.5 kg)  Height: 5\' 7"  (1.702 m)   Wt Readings from Last 3 Encounters:  03/31/18 122 lb 6 oz (55.5 kg)  02/28/18 121 lb (54.9 kg)  02/09/18 122 lb 1.6 oz (55.4 kg)   Lab Results  Component Value Date   CREATININE 0.95 02/09/2018   CREATININE 0.82 11/30/2017   CREATININE 0.72 11/29/2017   Physical Exam Vitals signs and nursing note reviewed.  Constitutional:      Appearance: She is well-developed.  HENT:     Head: Normocephalic and atraumatic.  Neck:     Musculoskeletal: Normal range of motion and neck supple.     Vascular: No JVD.  Cardiovascular:     Rate and Rhythm: Normal rate and regular rhythm.  Pulmonary:     Effort: Pulmonary effort is normal. No respiratory distress.     Breath sounds: No wheezing, rhonchi or rales.  Abdominal:     General: Bowel sounds are normal.     Palpations: Abdomen is soft.  Musculoskeletal:      Right lower leg: She exhibits no tenderness. No edema.     Left lower leg: She exhibits no tenderness. No edema.  Skin:    General: Skin is warm and dry.  Neurological:     Mental Status: She is alert and oriented to person, place, and time.  Psychiatric:        Behavior: Behavior normal.    Assessment & Plan:  1: Chronic heart failure with now reduced ejection fraction- - NYHA class III - echo to be repeated in a few months - euvolemic today - weighing daily; Reminded to call for an overnight weight gain of >2 pounds or a weekly weight gain of >5 pounds - weight unchanged from last visit here 3 months ago - not adding salt to her food. Reviewed the importance of reading food labels and closely following a 2000mg  sodium diet.  - saw cardiology Rockey Situ) 10/10/17 - says that she's received her flu vaccine - receiving PT twice a week  2: Paroxymal atrial fibrillation- - saw EP Caryl Comes) 02/28/2018 - pacemaker present - on flecainide  3: HTN- - BP looks good today - saw PCP Ouida Sills) 02/15/2018 - BMP 02/09/2018 reviewed and showed sodium 134, potassium 4.2, creatinine 0.95  and GFR 55  Medication list was reviewed.   Return in 4 months or sooner for any questions/problems before then.

## 2018-04-05 LAB — CUP PACEART INCLINIC DEVICE CHECK
Battery Impedance: 784 Ohm
Battery Remaining Longevity: 69 mo
Battery Voltage: 2.78 V
Brady Statistic AP VP Percent: 1 %
Brady Statistic AS VS Percent: 2 %
Date Time Interrogation Session: 20200211172947
Implantable Lead Implant Date: 20051130
Implantable Lead Implant Date: 20051130
Implantable Lead Location: 753859
Implantable Lead Model: 5076
Implantable Pulse Generator Implant Date: 20140901
Lead Channel Impedance Value: 605 Ohm
Lead Channel Impedance Value: 621 Ohm
Lead Channel Setting Pacing Amplitude: 2.5 V
Lead Channel Setting Pacing Amplitude: 2.5 V
Lead Channel Setting Pacing Pulse Width: 0.46 ms
Lead Channel Setting Sensing Sensitivity: 5.6 mV
MDC IDC LEAD LOCATION: 753860
MDC IDC STAT BRADY AP VS PERCENT: 97 %
MDC IDC STAT BRADY AS VP PERCENT: 0 %

## 2018-04-12 DIAGNOSIS — I480 Paroxysmal atrial fibrillation: Secondary | ICD-10-CM | POA: Diagnosis not present

## 2018-04-12 DIAGNOSIS — I5022 Chronic systolic (congestive) heart failure: Secondary | ICD-10-CM | POA: Diagnosis not present

## 2018-04-12 DIAGNOSIS — I739 Peripheral vascular disease, unspecified: Secondary | ICD-10-CM | POA: Diagnosis not present

## 2018-04-12 DIAGNOSIS — Z9989 Dependence on other enabling machines and devices: Secondary | ICD-10-CM | POA: Diagnosis not present

## 2018-04-20 ENCOUNTER — Other Ambulatory Visit: Payer: Self-pay

## 2018-04-20 ENCOUNTER — Ambulatory Visit (INDEPENDENT_AMBULATORY_CARE_PROVIDER_SITE_OTHER): Payer: Medicare HMO | Admitting: *Deleted

## 2018-04-20 DIAGNOSIS — I495 Sick sinus syndrome: Secondary | ICD-10-CM

## 2018-04-21 LAB — CUP PACEART REMOTE DEVICE CHECK
Battery Impedance: 864 Ohm
Battery Remaining Longevity: 65 mo
Battery Voltage: 2.78 V
Brady Statistic AP VP Percent: 3 %
Brady Statistic AP VS Percent: 94 %
Brady Statistic AS VP Percent: 0 %
Brady Statistic AS VS Percent: 2 %
Date Time Interrogation Session: 20200403133702
Implantable Lead Implant Date: 20051130
Implantable Lead Implant Date: 20051130
Implantable Lead Location: 753859
Implantable Lead Location: 753860
Implantable Lead Model: 5076
Implantable Pulse Generator Implant Date: 20140901
Lead Channel Impedance Value: 594 Ohm
Lead Channel Impedance Value: 619 Ohm
Lead Channel Pacing Threshold Amplitude: 1.25 V
Lead Channel Pacing Threshold Amplitude: 1.625 V
Lead Channel Pacing Threshold Pulse Width: 0.4 ms
Lead Channel Pacing Threshold Pulse Width: 0.4 ms
Lead Channel Setting Pacing Amplitude: 2.5 V
Lead Channel Setting Pacing Amplitude: 2.5 V
Lead Channel Setting Pacing Pulse Width: 0.46 ms
Lead Channel Setting Sensing Sensitivity: 5.6 mV

## 2018-04-26 ENCOUNTER — Telehealth: Payer: Self-pay

## 2018-04-26 NOTE — Telephone Encounter (Signed)
[facility]  Reference E-visit ,patient has an appointment scheduled with Dr. Caryl Comes 05/09/2018

## 2018-04-27 NOTE — Telephone Encounter (Signed)
Virtual Visit Pre-Appointment Phone Call   Confirm consent - "In the setting of the current Covid19 crisis, you are scheduled for a TELEPHONE visit with your provider on 05/09/2018 at 10:00 AM.  Just as we do with many in-office visits, in order for you to participate in this visit, we must obtain consent.  If you'd like, I can send this to your email for you to review.  Otherwise, I can obtain your verbal consent now.  All virtual visits are billed to your insurance company just like a normal visit would be.  By agreeing to a virtual visit, we'd like you to understand that the technology does not allow for your provider to perform an examination, and thus may limit your provider's ability to fully assess your condition.  Finally, though the technology is pretty good, we cannot assure that it will always work on either your or our end, and in the setting of a video visit, we may have to convert it to a phone-only visit.  In either situation, we cannot ensure that we have a secure connection.  Are you willing to proceed?"YES  A copy of consent was also e-mailed to patient.   TELEPHONE CALL NOTE  Alejandra Wiley has been deemed a candidate for a follow-up tele-health visit to limit community exposure during the Covid-19 pandemic. I spoke with the patient via phone to ensure availability of phone/video source, confirm preferred email & phone number, and discuss instructions and expectations.  I reminded Alejandra Wiley to be prepared with any vital sign and/or heart rhythm information that could potentially be obtained via home monitoring, at the time of her visit. I reminded Alejandra Wiley to expect a phone call at the time of her visit if her visit.  Did the patient verbally acknowledge consent to treatment? [place], RMA 04/27/2018 2:34 PM     CONSENT FOR TELE-HEALTH VISIT - PLEASE REVIEW  I hereby voluntarily request, consent and authorize [facility] and its employed or  contracted physicians, physician assistants, nurse practitioners or other licensed health care professionals (the Practitioner), to provide me with telemedicine health care services (the "Services") as deemed necessary by the treating Practitioner. I acknowledge and consent to receive the Services by the Practitioner via telemedicine. I understand that the telemedicine visit will involve communicating with the Practitioner through live audiovisual communication technology and the disclosure of certain medical information by electronic transmission. I acknowledge that I have been given the opportunity to request an in-person assessment or other available alternative prior to the telemedicine visit and am voluntarily participating in the telemedicine visit.  I understand that I have the right to withhold or withdraw my consent to the use of telemedicine in the course of my care at any time, without affecting my right to future care or treatment, and that the Practitioner or I may terminate the telemedicine visit at any time. I understand that I have the right to inspect all information obtained and/or recorded in the course of the telemedicine visit and may receive copies of available information for a reasonable fee.  I understand that some of the potential risks of receiving the Services via telemedicine include:  Marland Kitchen Delay or interruption in medical evaluation due to technological equipment failure or disruption; . Information transmitted may not be sufficient (e.g. poor resolution of images) to allow for appropriate medical decision making by the Practitioner; and/or  . In rare instances, security protocols could fail, causing a  breach of personal health information.  Furthermore, I acknowledge that it is my responsibility to provide information about my medical history, conditions and care that is complete and accurate to the best of my ability. I acknowledge that Practitioner's advice, recommendations,  and/or decision may be based on factors not within their control, such as incomplete or inaccurate data provided by me or distortions of diagnostic images or specimens that may result from electronic transmissions. I understand that the practice of medicine is not an exact science and that Practitioner makes no warranties or guarantees regarding treatment outcomes. I acknowledge that I will receive a copy of this consent concurrently upon execution via email to the email address I last provided but may also request a printed copy by calling the office of [facility].    I understand that my insurance will be billed for this visit.   I have read or had this consent read to me. . I understand the contents of this consent, which adequately explains the benefits and risks of the Services being provided via telemedicine.  . I have been provided ample opportunity to ask questions regarding this consent and the Services and have had my questions answered to my satisfaction. . I give my informed consent for the services to be provided through the use of telemedicine in my medical care  By participating in this telemedicine visit I agree to the above.

## 2018-04-28 ENCOUNTER — Encounter: Payer: Self-pay | Admitting: Cardiology

## 2018-04-28 NOTE — Progress Notes (Signed)
Remote pacemaker transmission.   

## 2018-05-09 ENCOUNTER — Telehealth (INDEPENDENT_AMBULATORY_CARE_PROVIDER_SITE_OTHER): Payer: Medicare HMO | Admitting: Internal Medicine

## 2018-05-09 ENCOUNTER — Other Ambulatory Visit: Payer: Self-pay

## 2018-05-09 VITALS — BP 179/95 | HR 79 | Ht 66.5 in | Wt 124.0 lb

## 2018-05-09 DIAGNOSIS — I5022 Chronic systolic (congestive) heart failure: Secondary | ICD-10-CM | POA: Diagnosis not present

## 2018-05-09 DIAGNOSIS — I951 Orthostatic hypotension: Secondary | ICD-10-CM

## 2018-05-09 DIAGNOSIS — R531 Weakness: Secondary | ICD-10-CM

## 2018-05-09 DIAGNOSIS — I10 Essential (primary) hypertension: Secondary | ICD-10-CM

## 2018-05-09 DIAGNOSIS — I442 Atrioventricular block, complete: Secondary | ICD-10-CM | POA: Diagnosis not present

## 2018-05-09 DIAGNOSIS — I4891 Unspecified atrial fibrillation: Secondary | ICD-10-CM | POA: Diagnosis not present

## 2018-05-09 NOTE — Patient Instructions (Addendum)
Medication Instructions:  - Your physician recommends that you continue on your current medications as directed. Please refer to the Current Medication list given to you today.  If you need a refill on your cardiac medications before your next appointment, please call your pharmacy.   Lab work: - none ordered  If you have labs (blood work) drawn today and your tests are completely normal, you will receive your results only by: Marland Kitchen MyChart Message (if you have MyChart) OR . A paper copy in the mail If you have any lab test that is abnormal or we need to change your treatment, we will call you to review the results.  Testing/Procedures: - none ordered  Follow-Up: At [facility], you and your health needs are our priority.  As part of our continuing mission to provide you with exceptional heart care, we have created designated Provider Care Teams.  These Care Teams include your primary Cardiologist (physician) and Advanced Practice Providers (APPs -  Physician Assistants and Nurse Practitioners) who all work together to provide you with the care you need, when you need it. . In 4 weeks with Dr. Caryl Comes  Any Other Special Instructions Will Be Listed Below (If Applicable). - N/A

## 2018-05-09 NOTE — Progress Notes (Signed)
Electrophysiology TeleHealth Note   Due to national recommendations of social distancing due to COVID 19, an audio/video telehealth visit is felt to be most appropriate for this patient at this time.  See MyChart message from today for the patient's consent to telehealth for [facility].   Date:  05/09/2018   ID:  Alejandra Wiley, DOB [birth year], MRN [id number]  Location: patient's home  Provider location: [address], [place]  Evaluation Performed: Follow-up visit  PCP:  Kirk Ruths, MD  Cardiologist:     Electrophysiologist:  SK   Chief Complaint:  Fatigue and orthostasis   History of Present Illness:    Alejandra Wiley is a 85 y.o. female who presents via audio/video conferencing for a telehealth visit today.  Since last being seen in our clinic, the patient reports persistent fatigue  Many BP measurements were reviewed, and her weakness is worse if BP < 110  Significant Orthostatic LH with tunnel vision--very careful with standing   Elevated HOB, nocturnal Orthostatic LH and so uses walker  Struggling with depression and stress.  Her husbands expenses are very high and the market has aggravated her concerns  She admits to depression-- on low dose remeron per dr MA  No chest pain,   Date VP%  3/14 1  3/*16 95  10/17 95  10/18 92-DDD-DDI  6/19 68  10/19 84  4/20  3.3      DATE TEST EF Vp %    /13 Echo  55-65%    8/19 Echo  50-55% 84%  1/20 Echo   30-35%           Date Cr Hgb  8/18 0.67 12.2  9/18 0.64 10  1/20 0.95 11.8           The patient denies symptoms of fevers, chills, cough, or new SOB worrisome for COVID 19.    Past Medical History:  Diagnosis Date  . Bradycardia   . Cancer ([place])    vaginal intraepithelial neoplasia  . CHF (congestive heart failure) ([place])   . Hypertension   . Pacemaker 12/06/2003   Medtronic Impulse DTDR01  . Pacemaker 2015  . PAF (paroxysmal atrial fibrillation) ([facility])     a. on Coumadin and flecainide; b. CHADS2VASc 3 (HTN, age x 2, female)  . Sick sinus syndrome ([place])   . Sinoatrial node dysfunction ([facility])   . Stroke ([place])   . Vaginal intraepithelial neoplasia     Past Surgical History:  Procedure Laterality Date  . APPENDECTOMY    . HERNIA REPAIR     lower abd  . IMAGE GUIDED SINUS SURGERY  2015  . INSERT / REPLACE / REMOVE PACEMAKER  12/06/2003   Medtronic impulse DTDR01  . JOINT REPLACEMENT Right    knee  . KYPHOPLASTY N/A 06/23/2017   Procedure: [id number];  Surgeon: Hessie Knows, MD;  Location: [facility] ORS;  Service: Orthopedics;  Laterality: N/A;  . LEEP  1999  . ORIF HIP FRACTURE Right 02/2016  . PACEMAKER GENERATOR CHANGE N/A 09/18/2012   Procedure: PACEMAKER GENERATOR CHANGE;  Surgeon: Deboraha Sprang, MD;  Location: [facility] CATH LAB;  Service: Cardiovascular;  Laterality: N/A;  . TONSILLECTOMY    . TOTAL KNEE ARTHROPLASTY    . TOTAL KNEE REVISION Right 09/23/2016   Procedure: TOTAL KNEE REVISION-POLYETHYLENE EXCHANGE;  Surgeon: Hessie Knows, MD;  Location: [facility] ORS;  Service: Orthopedics;  Laterality: Right;  Marland Kitchen VAGINAL HYSTERECTOMY      Current Outpatient Medications  Medication Sig Dispense Refill  . acetaminophen (TYLENOL) 500 MG tablet Take 500 mg by mouth every 8 (eight) hours as needed for mild pain or headache.    Marland Kitchen apixaban (ELIQUIS) 2.5 MG TABS tablet Take 1 tablet (2.5 mg total) by mouth 2 (two) times daily. 180 tablet 1  . flecainide (TAMBOCOR) 100 MG tablet Take 1 tablet (100 mg total) by mouth 2 (two) times daily. 180 tablet 3  . furosemide (LASIX) 40 MG tablet Take 1 tablet (40 mg total) by mouth daily. 30 tablet 0  . ibandronate (BONIVA) 150 MG tablet Take 150 mg by mouth every 30 (thirty) days. Take in the morning with a full glass of water, on an empty stomach, and do not take anything else by mouth or lie down for the next 30 min.    . nadolol (CORGARD) 40 MG tablet Take 1 tablet (40 mg total) by mouth daily. (Patient taking  differently: Take 40 mg by mouth 2 (two) times daily. ) 30 tablet 0  . OVER THE COUNTER MEDICATION Place 1 spray into the nose daily as needed (for nasal congestion). NeilMed Sinus Rinse     . Potassium Chloride ER 20 MEQ TBCR Take 10 mEq by mouth daily. 90 tablet 2  . fluticasone (FLONASE) 50 MCG/ACT nasal spray Place 2 sprays into both nostrils daily as needed for allergies or rhinitis.     . mirtazapine (REMERON SOL-TAB) 15 MG disintegrating tablet Take 0.5 tablets (7.5 mg total) by mouth at bedtime. (Patient not taking: Reported on 03/31/2018) 30 tablet 0   No current facility-administered medications for this visit.     Allergies:   Latex; Other; Tape; Xarelto [rivaroxaban]; and Nickel   Social History:  The patient  reports that she has never smoked. She has never used smokeless tobacco. She reports current alcohol use of about 1.0 standard drinks of alcohol per week. She reports that she does not use drugs.   Family History:  The patient's   family history includes Diabetes in her daughter and son; Heart disease in her father; Stroke in her mother.   ROS:  Please see the history of present illness.   All other systems are personally reviewed and negative.    Exam:    Vital Signs:  BP (!) 179/95 (BP Location: Left Arm, Patient Position: Sitting, Cuff Size: Normal)   Pulse 79   Ht 5' 6.5" (1.689 m)   Wt 124 lb (56.2 kg)   BMI 19.71 kg/m     Well appearing, alert and conversant, regular work of breathing,  good skin color Eyes- anicteric, neuro- grossly intact, skin- no apparent rash or lesions or cyanosis, mouth- oral mucosa is pink   Labs/Other Tests and Data Reviewed:    Recent Labs: 11/08/2017: Magnesium 2.0 11/28/2017: ALT 27; TSH 4.700 02/09/2018: BUN 30; Creatinine, Ser 0.95; Hemoglobin 11.8; Platelets 220; Potassium 4.2; Sodium 134   Wt Readings from Last 3 Encounters:  05/09/18 124 lb (56.2 kg)  03/31/18 122 lb 6 oz (55.5 kg)  02/28/18 121 lb (54.9 kg)       T   Last device remote is reviewed from [place] PDF dated 4/20 which reveals normal device function,   arrhythmias - no significant atrial fibrillation   ASSESSMENT & PLAN:    *Atrial arrhythmia/Fib  Sinus node dysfunction  Hypertension  Syncope  Pacemaker  Medtronic  Dual chamber   Elevated pacing thresholds A Greater    HFpEF>> LV dysfunction new  Orthostatic hypotension  Depression  Lengthy discussion regarding orthostatic intolerance and the risk for falls.  Reviewed blood pressure measurements taken before and after her nadolol.  Typically about a 30 mm drop.  Her orthostatic lightheadedness however is quite profound; she has tunnel vision near loss of vision most times when she stands; I extremely worried about her risk of falling.  We reviewed strategies including obtaining an abdominal binder to use particularly at night, we will change her nadolol to 40 mg twice daily instead of 80 mg every morning  This may also help with her daytime low blood pressures, but I am concerned about the impact on nocturnal orthostatic hypotension, hence the abdominal binder.  Mestinon might be useful to try to mitigate her orthostasis in the context of systolic hypertension.  Would like to avoid more medication if possible  Struggling with depression and isolation and financial worries.  I will reach out to Dr. Ouida Sills  COVID 19 screen The patient denies symptoms of COVID 19 at this time.  The importance of social distancing was discussed today.  Follow-up:  4 weeks  Next remote: As Scheduled   Current medicines are reviewed at length with the patient today.   The patient does not have concerns regarding her medicines.  The following changes were made today:  As above Change nadolol from 80 daily--40 twice daily  Labs/ tests ordered today include:   No orders of the defined types were placed in this encounter.    Patient Risk:  after full review of this patients clinical  status, I feel that they are at moderate risk at this time.  Today, I have spent 31 minutes with the patient with telehealth technology discussing the above.  Signed, [name], MD  05/09/2018 10:26 AM     [facility] [address] [place] [address] [phone] (office) [phone] (fax)

## 2018-06-08 ENCOUNTER — Telehealth (INDEPENDENT_AMBULATORY_CARE_PROVIDER_SITE_OTHER): Payer: Medicare HMO | Admitting: Internal Medicine

## 2018-06-08 ENCOUNTER — Other Ambulatory Visit: Payer: Self-pay

## 2018-06-08 VITALS — BP 172/89 | HR 81 | Ht 66.5 in | Wt 125.0 lb

## 2018-06-08 DIAGNOSIS — Z950 Presence of cardiac pacemaker: Secondary | ICD-10-CM

## 2018-06-08 DIAGNOSIS — I951 Orthostatic hypotension: Secondary | ICD-10-CM

## 2018-06-08 DIAGNOSIS — I4891 Unspecified atrial fibrillation: Secondary | ICD-10-CM | POA: Diagnosis not present

## 2018-06-08 MED ORDER — NADOLOL 40 MG PO TABS: 40.0000 mg | ORAL_TABLET | Freq: Two times a day (BID) | ORAL | Status: DC

## 2018-06-08 NOTE — Progress Notes (Signed)
Electrophysiology TeleHealth Note   Due to national recommendations of social distancing due to COVID 19, an audio/video telehealth visit is felt to be most appropriate for this patient at this time.  See MyChart message from today for the patient's consent to telehealth for Alejandra Wiley.   Date:  06/08/2018   ID:  Alejandra Wiley, DOB December 21, 1932, MRN [id number]  Location: patient's home  Provider location: [address], [place]  Evaluation Performed: Follow-up visit  PCP:  Kirk Ruths, MD  Cardiologist:     Electrophysiologist:  SK   Chief Complaint:  Fatigue and orthostasis   History of Present Illness:    Alejandra Wiley is a 85 y.o. female who presents via audio/video conferencing for a telehealth visit today.  Since last being seen in our clinic, the patient reports persistent fatigue  Significant problems with orthostasis prompted re-dosing her from the lateral wall, consideration of [place] a compressive wear.  Struggling  with depression and stress.    Date VP%  3/14 1  3/*16 95  10/17 95  10/18 92-DDD-DDI  6/19 68  10/19 84  4/20  3.3      DATE TEST EF Vp %    /13 Echo  55-65%    8/19 Echo  50-55% 84%  1/20 Echo   30-35%           Date Cr Hgb  8/18 0.67 12.2  9/18 0.64 10  1/20 0.95 11.8           The patient denies symptoms of fevers, chills, cough, or new SOB worrisome for COVID 19.    Past Medical History:  Diagnosis Date  . Bradycardia   . 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 CHF (congestive heart failure) ([place])   . Hypertension   . Pacemaker 12/06/2003   Medtronic Impulse DTDR01  . Pacemaker 2015  . PAF (paroxysmal atrial fibrillation) ([facility])    a. on Coumadin and flecainide; b. CHADS2VASc 3 (HTN, age x 2, female)  . Sick sinus syndrome ([employer])   . Sinoatrial node dysfunction ([facility])   . Stroke ([place])   . Vaginal intraepithelial neoplasia     Past Surgical History:   Procedure Laterality Date  . APPENDECTOMY    . HERNIA REPAIR     lower abd  . IMAGE GUIDED SINUS SURGERY  2015  . INSERT / REPLACE / REMOVE PACEMAKER  12/06/2003   Medtronic impulse DTDR01  . JOINT REPLACEMENT Right    knee  . KYPHOPLASTY N/A 06/23/2017   Procedure: [id number];  Surgeon: Hessie Knows, MD;  Location: [facility] ORS;  Service: Orthopedics;  Laterality: N/A;  . LEEP  1999  . ORIF HIP FRACTURE Right 02/2016  . PACEMAKER GENERATOR CHANGE N/A 09/18/2012   Procedure: PACEMAKER GENERATOR CHANGE;  Surgeon: Deboraha Sprang, MD;  Location: [facility] CATH LAB;  Service: Cardiovascular;  Laterality: N/A;  . TONSILLECTOMY    . TOTAL KNEE ARTHROPLASTY    . TOTAL KNEE REVISION Right 09/23/2016   Procedure: TOTAL KNEE REVISION-POLYETHYLENE EXCHANGE;  Surgeon: Hessie Knows, MD;  Location: [facility] ORS;  Service: Orthopedics;  Laterality: Right;  Marland Kitchen VAGINAL HYSTERECTOMY      Current Outpatient Medications  Medication Sig Dispense Refill  . acetaminophen (TYLENOL) 500 MG tablet Take 500 mg by mouth every 8 (eight) hours as needed for mild pain or headache.    Marland Kitchen apixaban (ELIQUIS) 2.5 MG TABS tablet Take 1 tablet (2.5 mg total) by mouth  2 (two) times daily. 180 tablet 1  . flecainide (TAMBOCOR) 100 MG tablet Take 1 tablet (100 mg total) by mouth 2 (two) times daily. 180 tablet 3  . fluticasone (FLONASE) 50 MCG/ACT nasal spray Place 2 sprays into both nostrils daily as needed for allergies or rhinitis.     . furosemide (LASIX) 40 MG tablet Take 1 tablet (40 mg total) by mouth daily. 30 tablet 0  . ibandronate (BONIVA) 150 MG tablet Take 150 mg by mouth every 30 (thirty) days. Take in the morning with a full glass of water, on an empty stomach, and do not take anything else by mouth or lie down for the next 30 min.    . mirtazapine (REMERON SOL-TAB) 15 MG disintegrating tablet Take 0.5 tablets (7.5 mg total) by mouth at bedtime. 30 tablet 0  . nadolol (CORGARD) 40 MG tablet Take 1 tablet (40 mg total) by  mouth daily. (Patient taking differently: Take 40 mg by mouth 2 (two) times daily. ) 30 tablet 0  . OVER THE COUNTER MEDICATION Place 1 spray into the nose daily as needed (for nasal congestion). NeilMed Sinus Rinse     . Potassium Chloride ER 20 MEQ TBCR Take 10 mEq by mouth daily. 90 tablet 2   No current facility-administered medications for this visit.     Allergies:   Latex; Other; Tape; Xarelto [rivaroxaban]; and Nickel   Social History:  The patient  reports that she has never smoked. She has never used smokeless tobacco. She reports current alcohol use of about 1.0 standard drinks of alcohol per week. She reports that she does not use drugs.   Family History:  The patient's   family history includes Diabetes in her daughter and son; Heart disease in her father; Stroke in her mother.   ROS:  Please see the history of present illness.   All other systems are personally reviewed and negative.    Exam:    Vital Signs:  BP (!) 172/89 Comment: am reading  Pulse 81   Ht 5' 6.5" (1.689 m)   Wt 125 lb (56.7 kg)   BMI 19.87 kg/m     BP at home 97/60 pm lowest  171/96  AM Avg 150-160's   PM Avg 100-150  Labs/Other Tests and Data Reviewed:    Recent Labs: 11/08/2017: Magnesium 2.0 11/28/2017: ALT 27; TSH 4.700 02/09/2018: BUN 30; Creatinine, Ser 0.95; Hemoglobin 11.8; Platelets 220; Potassium 4.2; Sodium 134   Wt Readings from Last 3 Encounters:  06/08/18 125 lb (56.7 kg)  05/09/18 124 lb (56.2 kg)  03/31/18 122 lb 6 oz (55.5 kg)       T  Last device remote is reviewed from [facility] [place] PDF dated 4/20 which reveals normal device function,   arrhythmias - no significant atrial fibrillation   ASSESSMENT & PLAN:    *Atrial arrhythmia/Fib  Sinus node dysfunction  Hypertension  Syncope  Pacemaker  Medtronic  Dual chamber   Elevated pacing thresholds A Greater    HFpEF>> LV dysfunction new  Orthostatic hypotension  Depression   e  Struggling with  depression and isolation and financial worries.  I will reach out to Dr. Ouida Sills  COVID 19 screen The patient denies symptoms of COVID 19 at this time.  The importance of social distancing was discussed today.  Follow-up:  4 weeks  Next remote: As Scheduled   Current medicines are reviewed at length with the patient today.   The patient does not have concerns regarding her  medicines.  The following changes were made today:  As above Change nadolol from 80 daily--40 twice daily  Labs/ tests ordered today include:   No orders of the defined types were placed in this encounter.    Patient Risk:  after full review of this patients clinical status, I feel that they are at moderate risk at this time.  Today, I have spent 15 minutes with the patient with telehealth technology discussing the above.  Signed, [name], MD  06/08/2018 11:28 AM     [place] [place] [address] [phone] (office) [phone] (fax)

## 2018-06-08 NOTE — Patient Instructions (Signed)
Medication Instructions:  - Your physician has recommended you make the following change in your medication:   1) Change nadolol 40 mg- take 1 tablet by mouth twice daily  If you need a refill on your cardiac medications before your next appointment, please call your pharmacy.   Lab work: - none ordered  If you have labs (blood work) drawn today and your tests are completely normal, you will receive your results only by: Marland Kitchen MyChart Message (if you have MyChart) OR . A paper copy in the mail If you have any lab test that is abnormal or we need to change your treatment, we will call you to review the results.  Testing/Procedures: - none ordered  Follow-Up: At [facility], you and your health needs are our priority.  As part of our continuing mission to provide you with exceptional heart care, we have created designated Provider Care Teams.  These Care Teams include your primary Cardiologist (physician) and Advanced Practice Providers (APPs -  Physician Assistants and Nurse Practitioners) who all work together to provide you with the care you need, when you need it.  . in 4-6 weeks with Dr. Caryl Comes- Tuesday 07/25/2018 at 10:30 am  Remote monitoring is used to monitor your Pacemaker of ICD from home. This monitoring reduces the number of office visits required to check your device to one time per year. It allows [place] to keep an eye on the functioning of your device to ensure it is working properly. You are scheduled for a device check from home on 07/20/2018. You may send your transmission at any time that day. If you have a wireless device, the transmission will be sent automatically. After your physician reviews your transmission, you will receive a postcard with your next transmission date.   Any Other Special Instructions Will Be Listed Below (If Applicable).  Nira Conn, RN for Dr. Caryl Comes will call you on Tuesday 5/26 to follow up on your orthostatic blood pressure readings.

## 2018-06-13 ENCOUNTER — Telehealth: Payer: Medicare HMO | Admitting: Internal Medicine

## 2018-06-13 ENCOUNTER — Telehealth: Payer: Self-pay | Admitting: *Deleted

## 2018-06-13 NOTE — Telephone Encounter (Signed)
-----   Message from Emily Filbert, RN sent at 06/08/2018 12:37 PM EDT ----- Call her on 5/26 - PM to follow up on her orthostatic BP readings.

## 2018-06-13 NOTE — Telephone Encounter (Signed)
I spoke with the patient regarding her orthostatic BP readings per Dr. Olin Pia request from her e-visit on 06/08/18.  Day 1:  Lying- 168/100 Sitting:- 176/86 (72) Standing- 155/78 (71)  Day 2: Lying:- 152/88 Sitting- 171/86  Standing- 139/86 (81)  Day 3:  Lying- 152/87 Sitting- 156/65 Standing- 147/90 (80)   Day 4 (today): Lying- 169/86 (77) Sitting- 170/80 Standing- 154/87 (75)   Per the patient, she is accustomed to using her walker when she first stands up in the AM or when she uses the restroom at night to get her balance.  During the day at home she requires no walker assistants.  For her walks outside, she will use a cane.  She denies extreme dizziness when going from sitting to standing.  She is aware I will review these readings with Dr. Caryl Comes and call her back with any recommendations.

## 2018-06-23 ENCOUNTER — Ambulatory Visit: Payer: Medicare HMO | Admitting: Family

## 2018-07-10 DIAGNOSIS — R0982 Postnasal drip: Secondary | ICD-10-CM | POA: Diagnosis not present

## 2018-07-10 DIAGNOSIS — J320 Chronic maxillary sinusitis: Secondary | ICD-10-CM | POA: Diagnosis not present

## 2018-07-12 DIAGNOSIS — I5022 Chronic systolic (congestive) heart failure: Secondary | ICD-10-CM | POA: Diagnosis not present

## 2018-07-12 DIAGNOSIS — I10 Essential (primary) hypertension: Secondary | ICD-10-CM | POA: Diagnosis not present

## 2018-07-12 DIAGNOSIS — I480 Paroxysmal atrial fibrillation: Secondary | ICD-10-CM | POA: Diagnosis not present

## 2018-07-12 DIAGNOSIS — I779 Disorder of arteries and arterioles, unspecified: Secondary | ICD-10-CM | POA: Diagnosis not present

## 2018-07-12 DIAGNOSIS — E7800 Pure hypercholesterolemia, unspecified: Secondary | ICD-10-CM | POA: Diagnosis not present

## 2018-07-12 DIAGNOSIS — Z9989 Dependence on other enabling machines and devices: Secondary | ICD-10-CM | POA: Diagnosis not present

## 2018-07-20 ENCOUNTER — Ambulatory Visit (INDEPENDENT_AMBULATORY_CARE_PROVIDER_SITE_OTHER): Payer: Medicare HMO | Admitting: *Deleted

## 2018-07-20 ENCOUNTER — Telehealth: Payer: Self-pay

## 2018-07-20 DIAGNOSIS — I442 Atrioventricular block, complete: Secondary | ICD-10-CM

## 2018-07-20 LAB — CUP PACEART REMOTE DEVICE CHECK
Battery Impedance: 913 Ohm
Battery Remaining Longevity: 63 mo
Battery Voltage: 2.78 V
Brady Statistic AP VP Percent: 6 %
Brady Statistic AP VS Percent: 92 %
Brady Statistic AS VP Percent: 1 %
Brady Statistic AS VS Percent: 2 %
Date Time Interrogation Session: 20200702141749
Implantable Lead Implant Date: 20051130
Implantable Lead Implant Date: 20051130
Implantable Lead Location: 753859
Implantable Lead Location: 753860
Implantable Lead Model: 5076
Implantable Pulse Generator Implant Date: 20140901
Lead Channel Impedance Value: 642 Ohm
Lead Channel Impedance Value: 693 Ohm
Lead Channel Pacing Threshold Amplitude: 1.125 V
Lead Channel Pacing Threshold Amplitude: 1.25 V
Lead Channel Pacing Threshold Pulse Width: 0.4 ms
Lead Channel Pacing Threshold Pulse Width: 0.4 ms
Lead Channel Sensing Intrinsic Amplitude: 11.2 mV
Lead Channel Setting Pacing Amplitude: 2.5 V
Lead Channel Setting Pacing Amplitude: 2.5 V
Lead Channel Setting Pacing Pulse Width: 0.46 ms
Lead Channel Setting Sensing Sensitivity: 5.6 mV

## 2018-07-20 NOTE — Telephone Encounter (Signed)

## 2018-07-25 ENCOUNTER — Ambulatory Visit (INDEPENDENT_AMBULATORY_CARE_PROVIDER_SITE_OTHER): Payer: Medicare HMO | Admitting: Internal Medicine

## 2018-07-25 ENCOUNTER — Other Ambulatory Visit: Payer: Self-pay

## 2018-07-25 ENCOUNTER — Encounter: Payer: Self-pay | Admitting: Internal Medicine

## 2018-07-25 VITALS — BP 136/78 | HR 153 | Ht 66.5 in | Wt 126.0 lb

## 2018-07-25 DIAGNOSIS — I4891 Unspecified atrial fibrillation: Secondary | ICD-10-CM

## 2018-07-25 DIAGNOSIS — Z950 Presence of cardiac pacemaker: Secondary | ICD-10-CM | POA: Diagnosis not present

## 2018-07-25 DIAGNOSIS — I495 Sick sinus syndrome: Secondary | ICD-10-CM | POA: Diagnosis not present

## 2018-07-25 MED ORDER — FLECAINIDE ACETATE 100 MG PO TABS: 50.0000 mg | ORAL_TABLET | Freq: Two times a day (BID) | ORAL | Status: DC

## 2018-07-25 MED ORDER — NADOLOL 40 MG PO TABS: 40.0000 mg | ORAL_TABLET | Freq: Every day | ORAL | Status: DC

## 2018-07-25 NOTE — Progress Notes (Signed)
Patient Care Team: Kirk Ruths, MD as PCP - General (Unknown Physician Specialty)   HPI  Alejandra Wiley is a 85 y.o. female seen in followup for palpitations associated with atrial arrhythmias as well as bradycardia requiring backup bradycardia pacing. She takes flecainide;  During kyphoplast  Course complicated by atrial tachycardia with pacing at upper rate limit     Date VP%  3/14 1  3/*16 95  10/17 95  10/18 92-DDD-DDI  6/19 68  10/19 84  4/20  3.3     DATE TEST EF Vp %    /13 Echo  55-65%    8/19 Echo  50-55% 84%  1/20 Echo   30-35%           Date Cr Hgb  8/18 0.67 12.2  9/18 0.64 10  1/20 0.95 11.8  6/20 0.9 11.7   Hospitalized 8/19 with syncope, thought prob orthostatic with objective evidence variable, ( one day no change, the next day 30 + HR increase.)   Also preceded by atrial fib which might have contributed to assoc weakness and fatigue and excessive rates assoc with walking>> rate response reprogrammed   Flecainide was increased   Pacemaker interrogation >> atrial failure to capture and device reprogrammed; also leading to uninterpretable data re Atrial arrhythmia \ DATE PR interval QRSduration Dose  6/13 AR 268 112 50  6/19 AR 308  VP 138 75  9/19 -- -- 100  7/20 AR420 Vp 174 100     1/20 Hospitalized with stroke- R sided weakness.  INR subtherapeutic 1.52.  >>imaging  CT  No acute stroke.  Carotids neg.  Coumadin d/c and apixoban started ( 2.5 mg bid -wt 55 kg;age->82)   Interval telehealth visit is notable for orthostasis prompting down titration of her nadolol  Still struggling with isolation and depression   I reached out to her PCP. His note reviewed  Still with LH   Finger swollen and unresponsive to prednisone  Some lower extremity swelling, now abated  No dietary indiscretion  Porch visits with her husband, struggling with isolation and loneliness.  He told her they probably will be able to see her again before he  dies, this related to the policies at [facility]   Thromboembolic risk factors ( age  -2, HTN-1, TIA/CVA-2, DM-1, CHF-1, Gender-1) for a CHADSVASc Score of >=8    Past Medical History:  Diagnosis Date  . Bradycardia   . 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 CHF (congestive heart failure) ([place])   . Hypertension   . Pacemaker 12/06/2003   Medtronic Impulse DTDR01  . Pacemaker 2015  . PAF (paroxysmal atrial fibrillation) ([facility])    a. on Coumadin and flecainide; b. CHADS2VASc 3 (HTN, age x 2, female)  . Sick sinus syndrome ([place])   . Sinoatrial node dysfunction ([facility])   . Stroke ([place])   . Vaginal intraepithelial neoplasia     Past Surgical History:  Procedure Laterality Date  . APPENDECTOMY    . HERNIA REPAIR     lower abd  . IMAGE GUIDED SINUS SURGERY  2015  . INSERT / REPLACE / REMOVE PACEMAKER  12/06/2003   Medtronic impulse DTDR01  . JOINT REPLACEMENT Right    knee  . KYPHOPLASTY N/A 06/23/2017   Procedure: [id number];  Surgeon: Hessie Knows, MD;  Location: [facility] ORS;  Service: Orthopedics;  Laterality: N/A;  . LEEP  1999  . ORIF HIP FRACTURE Right 02/2016  .  PACEMAKER GENERATOR CHANGE N/A 09/18/2012   Procedure: PACEMAKER GENERATOR CHANGE;  Surgeon: Deboraha Sprang, MD;  Location: [facility] CATH LAB;  Service: Cardiovascular;  Laterality: N/A;  . TONSILLECTOMY    . TOTAL KNEE ARTHROPLASTY    . TOTAL KNEE REVISION Right 09/23/2016   Procedure: TOTAL KNEE REVISION-POLYETHYLENE EXCHANGE;  Surgeon: Hessie Knows, MD;  Location: [facility] ORS;  Service: Orthopedics;  Laterality: Right;  Marland Kitchen VAGINAL HYSTERECTOMY      Current Outpatient Medications  Medication Sig Dispense Refill  . acetaminophen (TYLENOL) 500 MG tablet Take 500 mg by mouth every 8 (eight) hours as needed for mild pain or headache.    Marland Kitchen apixaban (ELIQUIS) 2.5 MG TABS tablet Take 1 tablet (2.5 mg total) by mouth 2 (two) times daily. 180 tablet 1  . flecainide (TAMBOCOR) 100 MG tablet Take 1 tablet (100 mg total)  by mouth 2 (two) times daily. 180 tablet 3  . fluticasone (FLONASE) 50 MCG/ACT nasal spray Place 2 sprays into both nostrils daily as needed for allergies or rhinitis.     . furosemide (LASIX) 40 MG tablet Take 1 tablet (40 mg total) by mouth daily. 30 tablet 0  . ibandronate (BONIVA) 150 MG tablet Take 150 mg by mouth every 30 (thirty) days. Take in the morning with a full glass of water, on an empty stomach, and do not take anything else by mouth or lie down for the next 30 min.    Marland Kitchen levothyroxine (SYNTHROID) 25 MCG tablet Take by mouth.    . nadolol (CORGARD) 40 MG tablet Take 1 tablet (40 mg total) by mouth 2 (two) times daily.    Marland Kitchen OVER THE COUNTER MEDICATION Place 1 spray into the nose daily as needed (for nasal congestion). NeilMed Sinus Rinse     . Potassium Chloride ER 20 MEQ TBCR Take 10 mEq by mouth daily. 90 tablet 2  . mirtazapine (REMERON SOL-TAB) 15 MG disintegrating tablet Take 0.5 tablets (7.5 mg total) by mouth at bedtime. 30 tablet 0   No current facility-administered medications for this visit.     Allergies  Allergen Reactions  . Latex Other (See Comments)    blisters  . Other Other (See Comments)    Blisters/ paper tape is Market researcher paper tape is Market researcher paper tape is Ok  Camera operator is Ok  . Tape Other (See Comments)    Blisters/ paper tape is Market researcher paper tape is Ok  . Xarelto [Rivaroxaban] Other (See Comments)    Gums bleeding and too much bruising  . Nickel Rash and Other (See Comments)    Review of Systems negative except from HPI and PMH  Physical Exam BP 136/78 (BP Location: Left Arm, Patient Position: Sitting, Cuff Size: Normal)   Pulse (!) 153   Ht 5' 6.5" (1.689 m)   Wt 126 lb (57.2 kg)   BMI 20.03 kg/m  Well developed and nourished in no acute distress HENT normal Neck supple with JVP-flat Clear Regular rate and rhythm, no murmurs or gallops Abd-soft with active BS No Clubbing cyanosis edema Skin-warm and dry A &  Oriented  Grossly normal sensory and motor function    ECG  AV pacing    Assessment and  Plan  Atrial arrhythmia/Fib  Sinus node dysfunction  Hypertension  Syncope  Pacemaker  Medtronic  Dual chamber   Elevated pacing thresholds A Greater    HFpEF>> LV dysfunction new  Orthostatic hypotension  1 AVB  worseing    Orthostasis still  an issue.  Will decrease nadolol from 40--20  Because of LV dysfunction not clear; did seem to track with ventricular pacing which is been addressed by reprogramming.  Now with significant worsening conduction delay on flecainide, will decrease it from 100--50 mg and repeat ECG next week.  We may need to stop it.  We will plan repeat echo in about 8 weeks but will determine this finally after the ECG  No swelling today.  Have told her she can take as needed extra dose of furosemide.   More than 50% of 40 min was spent in counseling related to the above

## 2018-07-25 NOTE — Patient Instructions (Addendum)
Medication Instructions:  - Your physician has recommended you make the following change in your medication:   1) Decrease flecainide 100 mg -take 1/2 tablet (50 mg) by mouth twice daily  2) Decrease nadolol 40 mg- take 1 tablet (40 mg) by mouth once daily  If you need a refill on your cardiac medications before your next appointment, please call your pharmacy.   Lab work: - none ordered  If you have labs (blood work) drawn today and your tests are completely normal, you will receive your results only by: Marland Kitchen MyChart Message (if you have MyChart) OR . A paper copy in the mail If you have any lab test that is abnormal or we need to change your treatment, we will call you to review the results.  Testing/Procedures: - none ordered  Follow-Up: At [facility], you and your health needs are our priority.  As part of our continuing mission to provide you with exceptional heart care, we have created designated Provider Care Teams.  These Care Teams include your primary Cardiologist (physician) and Advanced Practice Providers (APPs -  Physician Assistants and Nurse Practitioners) who all work together to provide you with the care you need, when you need it. . You will need a follow up appointment in 3 months with Dr. Caryl Comes (October) . Please call our office 2 months in advance to schedule this appointment (call in early-mid August to schedule)  - Nurse visit EKG in 1 week (please schedule on a day Dr. Caryl Comes is in the office to review)  Any Other Special Instructions Will Be Listed Below (If Applicable). - N/A

## 2018-07-27 ENCOUNTER — Encounter: Payer: Self-pay | Admitting: Cardiology

## 2018-07-27 NOTE — Progress Notes (Signed)
Remote pacemaker transmission.   

## 2018-07-28 ENCOUNTER — Ambulatory Visit: Payer: Medicare HMO | Admitting: Family

## 2018-08-03 ENCOUNTER — Encounter: Payer: Self-pay | Admitting: Internal Medicine

## 2018-08-03 ENCOUNTER — Ambulatory Visit (INDEPENDENT_AMBULATORY_CARE_PROVIDER_SITE_OTHER): Payer: Medicare HMO | Admitting: *Deleted

## 2018-08-03 ENCOUNTER — Other Ambulatory Visit: Payer: Self-pay

## 2018-08-03 VITALS — HR 82 | Resp 18 | Ht 66.5 in | Wt 128.5 lb

## 2018-08-03 DIAGNOSIS — I480 Paroxysmal atrial fibrillation: Secondary | ICD-10-CM

## 2018-08-03 NOTE — Patient Instructions (Addendum)
Medication Instructions:  - Your physician has recommended you make the following change in your medication:   1) STOP flecainide  If you need a refill on your cardiac medications before your next appointment, please call your pharmacy.   Lab work: - none ordered  If you have labs (blood work) drawn today and your tests are completely normal, you will receive your results only by: Marland Kitchen MyChart Message (if you have MyChart) OR . A paper copy in the mail If you have any lab test that is abnormal or we need to change your treatment, we will call you to review the results.  Testing/Procedures: - none ordered  Follow-Up: At [facility], you and your health needs are our priority.  As part of our continuing mission to provide you with exceptional heart care, we have created designated Provider Care Teams.  These Care Teams include your primary Cardiologist (physician) and Advanced Practice Providers (APPs -  Physician Assistants and Nurse Practitioners) who all work together to provide you with the care you need, when you need it. . - in 2 weeks for an EKG (on a day Dr. Caryl Comes is in the office)  Any Other Special Instructions Will Be Listed Below (If Applicable). - N/A

## 2018-08-04 NOTE — Progress Notes (Signed)
1.) Reason for visit: EKG  2.) Name of MD requesting visit: Caryl Comes  3.) H&P: Patient was seen in the office on 07/25/18 by Dr. Caryl Comes. She was noted to have significant conduction delay on her EKG and flecainide was decreased from 100 mg BID to 50 mg BID. She was advised to come back today for a repeat EKG on the lower dose flecainide.  4.) ROS related to problem: The patient is without complaints/ stable today. EKG obtained and reviewed with Dr. Caryl Comes.  5.) Assessment and plan per MD: Reviewed the patient's EKG with Dr. Caryl Comes. The patient is still with a fairly significant conduction delay. Per Dr. Caryl Comes, orders received to stop flecainide and repeat and EKG in 2 weeks. The patient voices understanding and is agreeable.

## 2018-08-04 NOTE — Progress Notes (Deleted)
Patient ID: Alejandra Wiley, female    DOB: [birth year], 85 y.o.   MRN: [id number]  HPI  [name] is a 85 y/o female with a history of vaginal cancer, sick sinus syndrome, paroxysmal atrial fibrillation, HTN and chronic heart failure.   Echo report from 02/10/2018 reviewed and showed an EF of 30-35% without clot. Echo report from 09/14/17 reviewed and showed an EF of 50-55% along with mod/severe MR and mild/mod TR with atrial fibrillation. Echo report from 06/24/17 reviewed and showed an EF of 50-55% along with mild/moderate MR and a mildly elevated PA pressure of 44 mm Hg.   Admitted 02/09/2018 due to acute stroke. Neurology consult obtained. Head CT & carotid dopplers were negative. Unable to get an MRI due to her pacemaker. Anticoagulant changed to eliquis and she was discharged the next day.   She presents today for a follow-up visit with a chief complaint of   Past Medical History:  Diagnosis Date  . Bradycardia   . Cancer ([place])    vaginal intraepithelial neoplasia  . CHF (congestive heart failure) ([place])   . Hypertension   . Pacemaker 12/06/2003   Medtronic Impulse DTDR01  . Pacemaker 2015  . PAF (paroxysmal atrial fibrillation) (HCC)    a. on Coumadin and flecainide; b. CHADS2VASc 3 (HTN, age x 2, female)  . Sick sinus syndrome ([place])   . Sinoatrial node dysfunction (HCC)   . Stroke ([place])   . Vaginal intraepithelial neoplasia    Past Surgical History:  Procedure Laterality Date  . APPENDECTOMY    . HERNIA REPAIR     lower abd  . IMAGE GUIDED SINUS SURGERY  2015  . INSERT / REPLACE / REMOVE PACEMAKER  12/06/2003   Medtronic impulse DTDR01  . JOINT REPLACEMENT Right    knee  . KYPHOPLASTY N/A 06/23/2017   Procedure: [id number];  Surgeon: Hessie Knows, MD;  Location: [facility] ORS;  Service: Orthopedics;  Laterality: N/A;  . LEEP  1999  . ORIF HIP FRACTURE Right 02/2016  . PACEMAKER GENERATOR CHANGE N/A 09/18/2012   Procedure: PACEMAKER GENERATOR CHANGE;  Surgeon: Deboraha Sprang, MD;  Location: [facility] CATH LAB;  Service: Cardiovascular;  Laterality: N/A;  . TONSILLECTOMY    . TOTAL KNEE ARTHROPLASTY    . TOTAL KNEE REVISION Right 09/23/2016   Procedure: TOTAL KNEE REVISION-POLYETHYLENE EXCHANGE;  Surgeon: Hessie Knows, MD;  Location: [facility] ORS;  Service: Orthopedics;  Laterality: Right;  Marland Kitchen VAGINAL HYSTERECTOMY     Family History  Problem Relation Age of Onset  . Stroke Mother   . Heart disease Father   . Diabetes Daughter   . Diabetes Son    Social History   Tobacco Use  . Smoking status: Never Smoker  . Smokeless tobacco: Never Used  Substance Use Topics  . Alcohol use: Yes    Alcohol/week: 1.0 standard drinks    Types: 1 Glasses of wine per week    Comment: occasionally   Allergies  Allergen Reactions  . Latex Other (See Comments)    blisters  . Other Other (See Comments)    Blisters/ paper tape is Market researcher paper tape is Market researcher paper tape is Ok  Camera operator is Ok  . Tape Other (See Comments)    Blisters/ paper tape is Market researcher paper tape is Ok  . Xarelto [Rivaroxaban] Other (See Comments)    Gums bleeding and too much bruising  . Nickel Rash and Other (See Comments)     Review of  Systems  Constitutional: Positive for fatigue (tire easily). Negative for appetite change.  HENT: Positive for congestion (sinus congestion). Negative for postnasal drip and sore throat.   Eyes: Negative.   Respiratory: Positive for shortness of breath (with minimal exertion). Negative for chest tightness.   Cardiovascular: Positive for palpitations (at times). Negative for chest pain and leg swelling.  Gastrointestinal: Negative for abdominal distention, abdominal pain, nausea and vomiting.  Endocrine: Negative.   Genitourinary: Negative.   Musculoskeletal: Positive for arthralgias (right leg at times) and back pain.  Skin: Negative.   Allergic/Immunologic: Negative.   Neurological: Positive for tremors, weakness (right leg) and  light-headedness. Negative for dizziness.  Hematological: Negative for adenopathy. Does not bruise/bleed easily.  Psychiatric/Behavioral: Positive for sleep disturbance (sleeping 8-9 hours/ night along with 2 naps daily). Negative for dysphoric mood. The patient is nervous/anxious.     Physical Exam Vitals signs and nursing note reviewed.  Constitutional:      Appearance: She is well-developed.  HENT:     Head: Normocephalic and atraumatic.  Neck:     Musculoskeletal: Normal range of motion and neck supple.     Vascular: No JVD.  Cardiovascular:     Rate and Rhythm: Normal rate and regular rhythm.  Pulmonary:     Effort: Pulmonary effort is normal. No respiratory distress.     Breath sounds: No wheezing, rhonchi or rales.  Abdominal:     General: Bowel sounds are normal.     Palpations: Abdomen is soft.  Musculoskeletal:     Right lower leg: She exhibits no tenderness. No edema.     Left lower leg: She exhibits no tenderness. No edema.  Skin:    General: Skin is warm and dry.  Neurological:     Mental Status: She is alert and oriented to person, place, and time.  Psychiatric:        Behavior: Behavior normal.    Assessment & Plan:  1: Chronic heart failure with now reduced ejection fraction- - NYHA class III - echo to be repeated in a few months - euvolemic today - weighing daily; Reminded to call for an overnight weight gain of >2 pounds or a weekly weight gain of >5 pounds - weight 122.6 from last visit here 4 months ago - not adding salt to her food. Reviewed the importance of reading food labels and closely following a 2000mg  sodium diet.  - saw cardiology Rockey Situ) 10/10/17 - receiving PT twice a week  2: Paroxymal atrial fibrillation- - saw EP Caryl Comes) 07/25/2018 - pacemaker present - on flecainide  3: HTN- - BP  - saw PCP Ouida Sills) 07/12/2018 - BMP 07/12/2018 reviewed and showed sodium 139, potassium 4.3, creatinine 0.9  and GFR 60  Medication list was  reviewed.

## 2018-08-07 ENCOUNTER — Ambulatory Visit: Payer: Medicare HMO | Admitting: Family

## 2018-08-14 ENCOUNTER — Telehealth: Payer: Self-pay | Admitting: Internal Medicine

## 2018-08-14 NOTE — Telephone Encounter (Signed)

## 2018-08-15 ENCOUNTER — Ambulatory Visit (INDEPENDENT_AMBULATORY_CARE_PROVIDER_SITE_OTHER): Payer: Medicare HMO

## 2018-08-15 ENCOUNTER — Other Ambulatory Visit: Payer: Self-pay

## 2018-08-15 VITALS — BP 113/73 | HR 73

## 2018-08-15 DIAGNOSIS — I480 Paroxysmal atrial fibrillation: Secondary | ICD-10-CM

## 2018-08-15 NOTE — Progress Notes (Signed)
1. Reason for visit: EKG  2. Name of MD requesting visit: Caryl Comes  3. H&P: Patient was seen in the office on 07/25/18 by Dr. Caryl Comes. She was noted to have significant conduction delay on her EKG and flecainide was decreased from 100 mg BID to 50 mg BID. She was seen for a nurse EKG visit on 08/03/18. It was noted that she still had prolonged conduction and flecainide was stopped with instructions for her to return in 2 weeks for a follow up EKG  4. ROS related to problem: The patient is without complaints/ stable today. EKG obtained and reviewed with Dr. Caryl Comes.  5. Assessment and plan per MD: Reviewed the patient's EKG with Dr. Caryl Comes. The patient EKG noted "normal" conduction.Per Dr. Caryl Comes, orders received patient should permanently stop flecainide. Patient is to follow up as planned. She should contact the office to report break through episodes of A-Fib. The patient voices understanding and is agreeable.

## 2018-08-16 ENCOUNTER — Other Ambulatory Visit: Payer: Self-pay | Admitting: Internal Medicine

## 2018-08-16 NOTE — Telephone Encounter (Signed)
Age 85, wt 58.3 kg, SCr 0.95, CrCl 41.3, last ov w/ SK 07/25/18.

## 2018-08-22 ENCOUNTER — Ambulatory Visit: Payer: Medicare HMO | Admitting: Family

## 2018-08-23 ENCOUNTER — Other Ambulatory Visit: Payer: Self-pay

## 2018-08-23 ENCOUNTER — Encounter: Payer: Self-pay | Admitting: Family

## 2018-08-23 ENCOUNTER — Ambulatory Visit: Payer: Medicare HMO | Attending: Family | Admitting: Family

## 2018-08-23 VITALS — BP 134/64 | HR 80 | Resp 18 | Ht 66.0 in | Wt 130.1 lb

## 2018-08-23 DIAGNOSIS — I10 Essential (primary) hypertension: Secondary | ICD-10-CM

## 2018-08-23 DIAGNOSIS — Z8559 Personal history of malignant neoplasm of other urinary tract organ: Secondary | ICD-10-CM | POA: Diagnosis not present

## 2018-08-23 DIAGNOSIS — I495 Sick sinus syndrome: Secondary | ICD-10-CM | POA: Insufficient documentation

## 2018-08-23 DIAGNOSIS — I110 Hypertensive heart disease with heart failure: Secondary | ICD-10-CM | POA: Insufficient documentation

## 2018-08-23 DIAGNOSIS — Z91048 Other nonmedicinal substance allergy status: Secondary | ICD-10-CM | POA: Insufficient documentation

## 2018-08-23 DIAGNOSIS — Z96651 Presence of right artificial knee joint: Secondary | ICD-10-CM | POA: Diagnosis not present

## 2018-08-23 DIAGNOSIS — Z888 Allergy status to other drugs, medicaments and biological substances status: Secondary | ICD-10-CM | POA: Insufficient documentation

## 2018-08-23 DIAGNOSIS — Z950 Presence of cardiac pacemaker: Secondary | ICD-10-CM | POA: Diagnosis not present

## 2018-08-23 DIAGNOSIS — Z833 Family history of diabetes mellitus: Secondary | ICD-10-CM | POA: Insufficient documentation

## 2018-08-23 DIAGNOSIS — Z8673 Personal history of transient ischemic attack (TIA), and cerebral infarction without residual deficits: Secondary | ICD-10-CM | POA: Diagnosis not present

## 2018-08-23 DIAGNOSIS — Z79890 Hormone replacement therapy (postmenopausal): Secondary | ICD-10-CM | POA: Insufficient documentation

## 2018-08-23 DIAGNOSIS — Z91040 Latex allergy status: Secondary | ICD-10-CM | POA: Diagnosis not present

## 2018-08-23 DIAGNOSIS — I5022 Chronic systolic (congestive) heart failure: Secondary | ICD-10-CM | POA: Diagnosis not present

## 2018-08-23 DIAGNOSIS — I480 Paroxysmal atrial fibrillation: Secondary | ICD-10-CM | POA: Diagnosis not present

## 2018-08-23 DIAGNOSIS — Z8249 Family history of ischemic heart disease and other diseases of the circulatory system: Secondary | ICD-10-CM | POA: Diagnosis not present

## 2018-08-23 DIAGNOSIS — Z7901 Long term (current) use of anticoagulants: Secondary | ICD-10-CM | POA: Insufficient documentation

## 2018-08-23 DIAGNOSIS — Z823 Family history of stroke: Secondary | ICD-10-CM | POA: Insufficient documentation

## 2018-08-23 DIAGNOSIS — Z79899 Other long term (current) drug therapy: Secondary | ICD-10-CM | POA: Diagnosis not present

## 2018-08-23 NOTE — Progress Notes (Signed)
Patient ID: Alejandra Wiley, female    DOB: 07/03/1932, 85 y.o.   MRN: [id number]  HPI  [name] is a 85 y/o female with a history of vaginal cancer, sick sinus syndrome, paroxysmal atrial fibrillation, HTN and chronic heart failure.   Echo report from 02/10/2018 reviewed and showed an EF of 30-35% without clot. Echo report from 09/14/17 reviewed and showed an EF of 50-55% along with mod/severe MR and mild/mod TR with atrial fibrillation. Echo report from 06/24/17 reviewed and showed an EF of 50-55% along with mild/moderate MR and a mildly elevated PA pressure of 44 mm Hg.   Has not been admitted or been in the ED in the last 6 months.    She presents today for a follow-up visit with a chief complaint of moderate fatigue upon minimal exertion. She describes this as chronic in nature having been present for several years. She has associated shortness of breath, light-headedness, pedal edema, palpitations, anxiety, difficulty sleeping and slight weight gain along with this. She denies any abdominal distention, chest pain or weight gain. She says that her home BP continues to fluctuate.   Past Medical History:  Diagnosis Date  . Bradycardia   . Cancer ([place])    vaginal intraepithelial neoplasia  . CHF (congestive heart failure) ([place])   . Hypertension   . Pacemaker 12/06/2003   Medtronic Impulse DTDR01  . Pacemaker 2015  . PAF (paroxysmal atrial fibrillation) (HCC)    a. on Coumadin and flecainide; b. CHADS2VASc 3 (HTN, age x 2, female)  . Sick sinus syndrome ([place])   . Sinoatrial node dysfunction (HCC)   . Stroke ([place])   . Vaginal intraepithelial neoplasia    Past Surgical History:  Procedure Laterality Date  . APPENDECTOMY    . HERNIA REPAIR     lower abd  . IMAGE GUIDED SINUS SURGERY  2015  . INSERT / REPLACE / REMOVE PACEMAKER  12/06/2003   Medtronic impulse DTDR01  . JOINT REPLACEMENT Right    knee  . KYPHOPLASTY N/A 06/23/2017   Procedure: [id number];  Surgeon: Hessie Knows,  MD;  Location: [facility] ORS;  Service: Orthopedics;  Laterality: N/A;  . LEEP  1999  . ORIF HIP FRACTURE Right 02/2016  . PACEMAKER GENERATOR CHANGE N/A 09/18/2012   Procedure: PACEMAKER GENERATOR CHANGE;  Surgeon: Deboraha Sprang, MD;  Location: [facility] CATH LAB;  Service: Cardiovascular;  Laterality: N/A;  . TONSILLECTOMY    . TOTAL KNEE ARTHROPLASTY    . TOTAL KNEE REVISION Right 09/23/2016   Procedure: TOTAL KNEE REVISION-POLYETHYLENE EXCHANGE;  Surgeon: Hessie Knows, MD;  Location: [facility] ORS;  Service: Orthopedics;  Laterality: Right;  Marland Kitchen VAGINAL HYSTERECTOMY     Family History  Problem Relation Age of Onset  . Stroke Mother   . Heart disease Father   . Diabetes Daughter   . Diabetes Son    Social History   Tobacco Use  . Smoking status: Never Smoker  . Smokeless tobacco: Never Used  Substance Use Topics  . Alcohol use: Yes    Alcohol/week: 1.0 standard drinks    Types: 1 Glasses of wine per week    Comment: occasionally   Allergies  Allergen Reactions  . Latex Other (See Comments)    blisters  . Other Other (See Comments)    Blisters/ paper tape is Market researcher paper tape is Market researcher paper tape is Ok  Camera operator is Ok  . Tape Other (See Comments)    Blisters/ paper tape is  Ok Blisters/ paper tape is Ok  . Xarelto [Rivaroxaban] Other (See Comments)    Gums bleeding and too much bruising  . Nickel Rash and Other (See Comments)   Prior to Admission medications   Medication Sig Start Date End Date Taking? Authorizing Provider  acetaminophen (TYLENOL) 500 MG tablet Take 500 mg by mouth every 8 (eight) hours as needed for mild pain or headache.   Yes [provider]  ELIQUIS 2.5 MG TABS tablet TAKE 1 TABLET TWICE DAILY 08/16/18  Yes Deboraha Sprang, MD  fluticasone [facility]) 50 MCG/ACT nasal spray Place 2 sprays into both nostrils daily as needed for allergies or rhinitis.    Yes [provider]  furosemide (LASIX) 40 MG tablet Take 1 tablet (40 mg  total) by mouth daily. 12/01/17  Yes Sudini, Alveta Heimlich, MD  ibandronate (BONIVA) 150 MG tablet Take 150 mg by mouth every 30 (thirty) days. Take in the morning with a full glass of water, on an empty stomach, and do not take anything else by mouth or lie down for the next 30 min.   Yes [provider]  levothyroxine (SYNTHROID) 25 MCG tablet Take by mouth. 07/13/18 07/13/19 Yes [provider]  mirtazapine (REMERON SOL-TAB) 15 MG disintegrating tablet Take 0.5 tablets (7.5 mg total) by mouth at bedtime. Patient taking differently: Take 15 mg by mouth at bedtime.  12/01/17 08/23/18 Yes Sudini, Alveta Heimlich, MD  nadolol (CORGARD) 40 MG tablet Take 1 tablet (40 mg total) by mouth daily. Patient taking differently: Take 40 mg by mouth 2 (two) times daily.  07/25/18  Yes Deboraha Sprang, MD  OVER THE COUNTER MEDICATION Place 1 spray into the nose daily as needed (for nasal congestion). NeilMed Sinus Rinse    Yes [provider]  Potassium Chloride ER 20 MEQ TBCR Take 10 mEq by mouth daily. 01/31/18  Yes Deboraha Sprang, MD    Review of Systems  Constitutional: Positive for fatigue (tire easily). Negative for appetite change.  HENT: Positive for congestion (sinus congestion). Negative for postnasal drip and sore throat.   Eyes: Negative.   Respiratory: Positive for shortness of breath (with minimal exertion). Negative for chest tightness.   Cardiovascular: Positive for palpitations (at times) and leg swelling (at times). Negative for chest pain.  Gastrointestinal: Negative for abdominal distention, abdominal pain, nausea and vomiting.  Endocrine: Negative.   Genitourinary: Negative.   Musculoskeletal: Positive for arthralgias (right leg at times) and back pain.  Skin: Negative.   Allergic/Immunologic: Negative.   Neurological: Positive for tremors, weakness (right leg) and light-headedness (at times). Negative for dizziness.  Hematological: Negative for adenopathy. Does not bruise/bleed  easily.  Psychiatric/Behavioral: Positive for sleep disturbance (sleeping 8-9 hours/ night along with 1-2 naps daily). Negative for dysphoric mood. The patient is nervous/anxious (at times).    Vitals:   08/23/18 1404  BP: 134/64  Pulse: 80  Resp: 18  SpO2: 99%  Weight: 130 lb 2 oz (59 kg)  Height: 5\' 6"  (1.676 m)   Wt Readings from Last 3 Encounters:  08/23/18 130 lb 2 oz (59 kg)  08/04/18 128 lb 8 oz (58.3 kg)  07/25/18 126 lb (57.2 kg)   Lab Results  Component Value Date   CREATININE 0.95 02/09/2018   CREATININE 0.82 11/30/2017   CREATININE 0.72 11/29/2017    Physical Exam Vitals signs and nursing note reviewed.  Constitutional:      Appearance: She is well-developed.  HENT:     Head: Normocephalic and atraumatic.  Neck:     Musculoskeletal: Normal range of motion and neck supple.     Vascular: No JVD.  Cardiovascular:     Rate and Rhythm: Normal rate and regular rhythm.  Pulmonary:     Effort: Pulmonary effort is normal. No respiratory distress.     Breath sounds: No wheezing, rhonchi or rales.  Abdominal:     General: Bowel sounds are normal.     Palpations: Abdomen is soft.  Musculoskeletal:     Right lower leg: She exhibits no tenderness. No edema.     Left lower leg: She exhibits no tenderness. No edema.  Skin:    General: Skin is warm and dry.  Neurological:     Mental Status: She is alert and oriented to person, place, and time.  Psychiatric:        Behavior: Behavior normal.    Assessment & Plan:  1: Chronic heart failure with reduced ejection fraction- - NYHA class III - euvolemic today - weighing daily; Reminded to call for an overnight weight gain of >2 pounds or a weekly weight gain of >5 pounds - weight up 8 pounds from last visit here 4 months ago; patient says that she's been trying to gain weight and that this weight gain has been gradual - not adding salt to her food. Reviewed the importance of reading food labels and closely following a  2000mg  sodium diet.  - saw cardiology Rockey Situ) 10/10/17 - has been walking ~ 1 mile three times/ week  2: Paroxymal atrial fibrillation- - saw EP Caryl Comes) 07/25/2018 - pacemaker present - on flecainide  3: HTN- - BP looks good today; home BP chart was reviewed and it does show fluctuations - saw PCP Ouida Sills) 07/12/2018 - BMP 07/12/2018 reviewed and showed sodium 139, potassium 4.3, creatinine 0.9  and GFR 60  Medication list was reviewed.   Will not make a return appointment at this time. Advised patient that she could call back at anytime to make another appointment. Patient was comfortable with this plan.

## 2018-08-23 NOTE — Patient Instructions (Signed)
Continue weighing daily and call for an overnight weight gain of > 2 pounds or a weekly weight gain of >5 pounds. 

## 2018-09-19 DIAGNOSIS — E039 Hypothyroidism, unspecified: Secondary | ICD-10-CM | POA: Diagnosis not present

## 2018-10-19 ENCOUNTER — Telehealth: Payer: Self-pay | Admitting: Internal Medicine

## 2018-10-19 ENCOUNTER — Ambulatory Visit (INDEPENDENT_AMBULATORY_CARE_PROVIDER_SITE_OTHER): Payer: Medicare HMO | Admitting: *Deleted

## 2018-10-19 DIAGNOSIS — I495 Sick sinus syndrome: Secondary | ICD-10-CM

## 2018-10-19 LAB — CUP PACEART REMOTE DEVICE CHECK
Battery Impedance: 913 Ohm
Battery Remaining Longevity: 66 mo
Battery Voltage: 2.78 V
Brady Statistic AP VP Percent: 2 %
Brady Statistic AP VS Percent: 71 %
Brady Statistic AS VP Percent: 1 %
Brady Statistic AS VS Percent: 26 %
Date Time Interrogation Session: 20201001160905
Implantable Lead Implant Date: 20051130
Implantable Lead Implant Date: 20051130
Implantable Lead Location: 753859
Implantable Lead Location: 753860
Implantable Lead Model: 5076
Implantable Pulse Generator Implant Date: 20140901
Lead Channel Impedance Value: 610 Ohm
Lead Channel Impedance Value: 618 Ohm
Lead Channel Pacing Threshold Amplitude: 0.75 V
Lead Channel Pacing Threshold Amplitude: 1.125 V
Lead Channel Pacing Threshold Pulse Width: 0.4 ms
Lead Channel Pacing Threshold Pulse Width: 0.4 ms
Lead Channel Sensing Intrinsic Amplitude: 0.7 mV
Lead Channel Sensing Intrinsic Amplitude: 11.2 mV
Lead Channel Setting Pacing Amplitude: 2 V
Lead Channel Setting Pacing Amplitude: 2.5 V
Lead Channel Setting Pacing Pulse Width: 0.46 ms
Lead Channel Setting Sensing Sensitivity: 5.6 mV

## 2018-10-19 NOTE — Telephone Encounter (Signed)
I help the pt send the transmission for her appointment. Once the monitor got going she thanked me for the call and said good bye.

## 2018-10-19 NOTE — Telephone Encounter (Signed)
°  1. Has your device fired? No   2. Is you device beeping? No   3. Are you experiencing draining or swelling at device site? No   4. Are you calling to see if we received your device transmission? Yes patient states remote check is not working   5. Have you passed out? No     Please route to [place]

## 2018-10-26 ENCOUNTER — Encounter: Payer: Self-pay | Admitting: Cardiology

## 2018-10-26 NOTE — Progress Notes (Signed)
Remote pacemaker transmission.   

## 2018-10-31 ENCOUNTER — Encounter: Payer: Self-pay | Admitting: Internal Medicine

## 2018-10-31 ENCOUNTER — Other Ambulatory Visit: Payer: Self-pay

## 2018-10-31 ENCOUNTER — Ambulatory Visit (INDEPENDENT_AMBULATORY_CARE_PROVIDER_SITE_OTHER): Payer: Medicare HMO | Admitting: Internal Medicine

## 2018-10-31 VITALS — BP 112/60 | HR 80 | Temp 97.7°F | Ht 67.0 in | Wt 133.5 lb

## 2018-10-31 DIAGNOSIS — I480 Paroxysmal atrial fibrillation: Secondary | ICD-10-CM | POA: Diagnosis not present

## 2018-10-31 DIAGNOSIS — I495 Sick sinus syndrome: Secondary | ICD-10-CM | POA: Diagnosis not present

## 2018-10-31 DIAGNOSIS — Z950 Presence of cardiac pacemaker: Secondary | ICD-10-CM

## 2018-10-31 DIAGNOSIS — I442 Atrioventricular block, complete: Secondary | ICD-10-CM | POA: Diagnosis not present

## 2018-10-31 LAB — CUP PACEART INCLINIC DEVICE CHECK
Battery Impedance: 938 Ohm
Battery Remaining Longevity: 65 mo
Battery Voltage: 2.78 V
Brady Statistic AP VP Percent: 2 %
Brady Statistic AP VS Percent: 70 %
Brady Statistic AS VP Percent: 1 %
Brady Statistic AS VS Percent: 27 %
Date Time Interrogation Session: 20201013155944
Implantable Lead Implant Date: 20051130
Implantable Lead Implant Date: 20051130
Implantable Lead Location: 753859
Implantable Lead Location: 753860
Implantable Lead Model: 5076
Implantable Pulse Generator Implant Date: 20140901
Lead Channel Impedance Value: 606 Ohm
Lead Channel Impedance Value: 619 Ohm
Lead Channel Pacing Threshold Amplitude: 0.75 V
Lead Channel Pacing Threshold Amplitude: 0.75 V
Lead Channel Pacing Threshold Amplitude: 1.125 V
Lead Channel Pacing Threshold Amplitude: 1.25 V
Lead Channel Pacing Threshold Pulse Width: 0.4 ms
Lead Channel Pacing Threshold Pulse Width: 0.4 ms
Lead Channel Pacing Threshold Pulse Width: 0.4 ms
Lead Channel Pacing Threshold Pulse Width: 0.46 ms
Lead Channel Sensing Intrinsic Amplitude: 0.5 mV
Lead Channel Sensing Intrinsic Amplitude: 11.2 mV
Lead Channel Setting Pacing Amplitude: 2 V
Lead Channel Setting Pacing Amplitude: 2.5 V
Lead Channel Setting Pacing Pulse Width: 0.46 ms
Lead Channel Setting Sensing Sensitivity: 5.6 mV

## 2018-10-31 NOTE — Progress Notes (Signed)
Patient Care Team: Kirk Ruths, MD as PCP - General (Unknown Physician Specialty)   HPI  Alejandra Wiley is a 85 y.o. female seen in followup for palpitations associated with atrial arrhythmias as well as bradycardia requiring backup bradycardia pacing. She takes flecainide;  During kyphoplast  Course complicated by atrial tachycardia with pacing at upper rate limit     Date VP%  3/14 1  3/*16 95  10/17 95  10/18 92-DDD-DDI  6/19 68  10/19 84  4/20  3.3     DATE TEST EF Vp %    /13 Echo  55-65%    8/19 Echo  50-55% 84%  1/20 Echo   30-35%           Date Cr Hgb  8/18 0.67 12.2  9/18 0.64 10  1/20 0.95 11.8  6/20 0.9 11.7   Hospitalized 8/19 with syncope, thought prob orthostatic with objective evidence variable, ( one day no change, the next day 30 + HR increase.)   Also preceded by atrial fib which might have contributed to assoc weakness and fatigue and excessive rates assoc with walking>> rate response reprogrammed   Flecainide was increased   Pacemaker interrogation >> atrial failure to capture and device reprogrammed; also leading to uninterpretable data re Atrial arrhythmia \ DATE PR interval QRSduration Dose  6/13 AR 268 112 50  6/19 AR 308  VP 138 75  9/19 -- -- 100  7/20 AR420 Vp 174 100  08/15/18 AR 242 92  0     1/20 Hospitalized with stroke- R sided weakness.  INR subtherapeutic 1.52.  >>imaging  CT  No acute stroke.  Carotids neg.  Coumadin d/c and apixoban started ( 2.5 mg bid -wt 55 kg;age->82)   Interval telehealth visit is notable for orthostasis prompting down titration of her nadolol  Finger still an issue, precludes the piano-- ambivalent about playing again  Meeting her husband tomorrow for their anniversary  Walking with less sob; no edema,  Some orthostasis  No chest pain       Thromboembolic risk factors ( age  -2, HTN-1, TIA/CVA-2, DM-1, CHF-1, Gender-1) for a CHADSVASc Score of >=8    Past Medical History:   Diagnosis Date  . Bradycardia   . Cancer ([place])    vaginal intraepithelial neoplasia  . CHF (congestive heart failure) ([place])   . Hypertension   . Pacemaker 12/06/2003   Medtronic Impulse DTDR01  . Pacemaker 2015  . PAF (paroxysmal atrial fibrillation) (HCC)    a. on Coumadin and flecainide; b. CHADS2VASc 3 (HTN, age x 2, female)  . Sick sinus syndrome ([place])   . Sinoatrial node dysfunction (HCC)   . Stroke ([place])   . Vaginal intraepithelial neoplasia     Past Surgical History:  Procedure Laterality Date  . APPENDECTOMY    . HERNIA REPAIR     lower abd  . IMAGE GUIDED SINUS SURGERY  2015  . INSERT / REPLACE / REMOVE PACEMAKER  12/06/2003   Medtronic impulse DTDR01  . JOINT REPLACEMENT Right    knee  . KYPHOPLASTY N/A 06/23/2017   Procedure: [id number];  Surgeon: Hessie Knows, MD;  Location: [facility] ORS;  Service: Orthopedics;  Laterality: N/A;  . LEEP  1999  . ORIF HIP FRACTURE Right 02/2016  . PACEMAKER GENERATOR CHANGE N/A 09/18/2012   Procedure: PACEMAKER GENERATOR CHANGE;  Surgeon: Deboraha Sprang, MD;  Location: [facility] CATH LAB;  Service: Cardiovascular;  Laterality: N/A;  .  TONSILLECTOMY    . TOTAL KNEE ARTHROPLASTY    . TOTAL KNEE REVISION Right 09/23/2016   Procedure: TOTAL KNEE REVISION-POLYETHYLENE EXCHANGE;  Surgeon: Hessie Knows, MD;  Location: [facility] ORS;  Service: Orthopedics;  Laterality: Right;  Marland Kitchen VAGINAL HYSTERECTOMY      Current Outpatient Medications  Medication Sig Dispense Refill  . ELIQUIS 2.5 MG TABS tablet TAKE 1 TABLET TWICE DAILY 180 tablet 1  . furosemide (LASIX) 40 MG tablet Take 1 tablet (40 mg total) by mouth daily. 30 tablet 0  . ibandronate (BONIVA) 150 MG tablet Take 150 mg by mouth every 30 (thirty) days. Take in the morning with a full glass of water, on an empty stomach, and do not take anything else by mouth or lie down for the next 30 min.    Marland Kitchen levothyroxine (SYNTHROID) 25 MCG tablet Take 25 mcg by mouth daily before breakfast.     . mirtazapine  (REMERON SOL-TAB) 15 MG disintegrating tablet Take 0.5 tablets (7.5 mg total) by mouth at bedtime. (Patient taking differently: Take 15 mg by mouth at bedtime. ) 30 tablet 0  . nadolol (CORGARD) 40 MG tablet Take 1 tablet (40 mg total) by mouth daily. (Patient taking differently: Take 40 mg by mouth 2 (two) times daily. )    . OVER THE COUNTER MEDICATION Place 1 spray into the nose daily as needed (for nasal congestion). NeilMed Sinus Rinse     . Potassium Chloride ER 20 MEQ TBCR Take 10 mEq by mouth daily. 90 tablet 2  . acetaminophen (TYLENOL) 500 MG tablet Take 500 mg by mouth every 8 (eight) hours as needed for mild pain or headache.    . fluticasone (FLONASE) 50 MCG/ACT nasal spray Place 2 sprays into both nostrils daily as needed for allergies or rhinitis.      No current facility-administered medications for this visit.     Allergies  Allergen Reactions  . Latex Other (See Comments)    blisters  . Other Other (See Comments)    Blisters/ paper tape is Market researcher paper tape is Market researcher paper tape is Ok  Camera operator is Ok  . Tape Other (See Comments)    Blisters/ paper tape is Market researcher paper tape is Ok  . Xarelto [Rivaroxaban] Other (See Comments)    Gums bleeding and too much bruising  . Nickel Rash and Other (See Comments)    Review of Systems negative except from HPI and PMH  Physical Exam BP 112/60 (BP Location: Left Arm, Patient Position: Sitting, Cuff Size: Normal)   Pulse 80   Temp 97.7 F (36.5 C)   Ht 5\' 7"  (1.702 m)   Wt 133 lb 8 oz (60.6 kg)   BMI 20.91 kg/m  Well developed and nourished in no acute distress HENT normal Neck supple with JVP-  flat   Clear Device pocket well healed; without hematoma or erythema.  There is no tethering  Regular rate and rhythm, no murmurs or gallops Abd-soft with active BS No Clubbing cyanosis edema Skin-warm and dry A & Oriented  Grossly normal sensory and motor function  ECG sinus @ 80 27/10/40  freq PAC with aberrant conduction   Assessment and  Plan  Atrial arrhythmia/Fib  Sinus node dysfunction  Hypertension  Syncope  Pacemaker  Medtronic  Dual chamber   Elevated pacing thresholds A Greater    HFpEF>> LV dysfunction new  Orthostatic hypotension  1 AVB worsening   Orthostasis still an issue.  Will decrease nadolol  from 40--20   Conduction markedly improved off of flecainide.  This is also decreased the ventricular pacing burden from 100--3%.  Hopefully this will translate into improved LV function.  We will repeat echo next month.  Euvolemic continue current meds  Less lightheadedness.  No interval syncope.  No significant atrial fibrillation off the flecainide.  There are frequent PACs. On Anticoagulation;  No bleeding issues    Blood pressure well controlled.  We spent more than 50% of our >25 min visit in face to face counseling regarding the above

## 2018-10-31 NOTE — Patient Instructions (Addendum)
Medication Instructions:  - Your physician recommends that you continue on your current medications as directed. Please refer to the Current Medication list given to you today.  If you need a refill on your cardiac medications before your next appointment, please call your pharmacy.   Lab work: - none ordered  If you have labs (blood work) drawn today and your tests are completely normal, you will receive your results only by: Marland Kitchen MyChart Message (if you have MyChart) OR . A paper copy in the mail If you have any lab test that is abnormal or we need to change your treatment, we will call you to review the results.  Testing/Procedures: - Your physician has requested that you have an echocardiogram- in 1 month. Echocardiography is a painless test that uses sound waves to create images of your heart. It provides your doctor with information about the size and shape of your heart and how well your heart's chambers and valves are working. This procedure takes approximately one hour. There are no restrictions for this procedure.   Follow-Up: At [facility], you and your health needs are our priority.  As part of our continuing mission to provide you with exceptional heart care, we have created designated Provider Care Teams.  These Care Teams include your primary Cardiologist (physician) and Advanced Practice Providers (APPs -  Physician Assistants and Nurse Practitioners) who all work together to provide you with the care you need, when you need it. . You will need a follow up appointment in 6 months (April 2021 with Dr. Caryl Comes) . Please call our office 2 months in advance to schedule this appointment. (Call in early February to schedule).  Remote monitoring is used to monitor your Pacemaker of ICD from home. This monitoring reduces the number of office visits required to check your device to one time per year. It allows [place] to keep an eye on the functioning of your device to ensure it is working  properly. You are scheduled for a device check from home on 01/18/19. You may send your transmission at any time that day. If you have a wireless device, the transmission will be sent automatically. After your physician reviews your transmission, you will receive a postcard with your next transmission date.   Any Other Special Instructions Will Be Listed Below (If Applicable). - N/A

## 2018-11-02 ENCOUNTER — Telehealth: Payer: Self-pay | Admitting: Internal Medicine

## 2018-11-02 NOTE — Telephone Encounter (Signed)
I attempted to call the patient. °No answer- I left a message to please call back.  °

## 2018-11-02 NOTE — Telephone Encounter (Signed)
Remeron 75 mg po q d     Nadolol 40 po BID   Pt c/o medication issue:  1. Name of Medication: Remeron and Nadolol   2. How are you currently taking this medication (dosage and times per day)?  Remeron 15 mg po q d     Nadolol 40 po BID    3. Are you having a reaction (difficulty breathing--STAT)?  No   4. What is your medication issue? Patient had recent visit With Dr. Caryl Comes and was told to fu with heather about medication doses.  She has been taking these differently than rx

## 2018-11-08 DIAGNOSIS — E039 Hypothyroidism, unspecified: Secondary | ICD-10-CM | POA: Insufficient documentation

## 2018-11-08 DIAGNOSIS — Z0000 Encounter for general adult medical examination without abnormal findings: Secondary | ICD-10-CM | POA: Diagnosis not present

## 2018-11-08 DIAGNOSIS — I779 Disorder of arteries and arterioles, unspecified: Secondary | ICD-10-CM | POA: Diagnosis not present

## 2018-11-08 DIAGNOSIS — Z9989 Dependence on other enabling machines and devices: Secondary | ICD-10-CM | POA: Diagnosis not present

## 2018-11-08 DIAGNOSIS — I5022 Chronic systolic (congestive) heart failure: Secondary | ICD-10-CM | POA: Diagnosis not present

## 2018-11-08 DIAGNOSIS — I110 Hypertensive heart disease with heart failure: Secondary | ICD-10-CM | POA: Diagnosis not present

## 2018-11-08 DIAGNOSIS — Z20828 Contact with and (suspected) exposure to other viral communicable diseases: Secondary | ICD-10-CM | POA: Diagnosis not present

## 2018-11-08 DIAGNOSIS — I480 Paroxysmal atrial fibrillation: Secondary | ICD-10-CM | POA: Diagnosis not present

## 2018-11-08 DIAGNOSIS — E7800 Pure hypercholesterolemia, unspecified: Secondary | ICD-10-CM | POA: Diagnosis not present

## 2018-11-08 DIAGNOSIS — Z23 Encounter for immunization: Secondary | ICD-10-CM | POA: Diagnosis not present

## 2018-11-15 NOTE — Telephone Encounter (Signed)
I spoke with a rep from [facility] that the patient was requesting a lower cost alternative to her nadolol. They were calling to confirm if we had received a fax from them from June in regards to this. I advised that I had not seen anything come in on this patient. They verified the fax they sent this to was our [place] location.   They will refax the form.

## 2018-11-15 NOTE — Telephone Encounter (Signed)
Follow Up  Lyric from [facility] is calling in to verify if the office received the medication assistance program for patient to Dr. Caryl Comes. Please give Lyric a call back to confirm.    Lyric from [place]

## 2018-11-17 NOTE — Telephone Encounter (Signed)
Dr. Caryl Comes made aware of Remeron and Nadolol dosing per the patient's call back. Doses changed on the [facility] to reflect this.  Dr. Caryl Comes states he tried to call the patient after I did and was unable to reach her as well. He was wanting to see how her visit went with her husband at memory care [facility]).  I attempted to call the patient back again today. No answer- no voice mail after multiple rings.   Will try to call back at a later time.

## 2018-11-21 NOTE — Telephone Encounter (Signed)
I was able to speak with the patient today. I advised her Dr. Caryl Comes reviewed the med doses she called in on her remeron and nadolol and we have adjusted these dose on her med list to reflect exactly what she is taking.  I advised Dr. Caryl Comes was ok with her current doses on these meds.   I also advised her Dr. Caryl Comes was wanting to know how the visit went with her husband recently. She states it went fairly well. He did know who she was the day of her visit, though her time with him was limited. 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  She will write letters to him and speak with him daily.  [name] states that [employer] had 3 COVID related patient deaths at the [employer] where her husband is located, so he is very much on lock down now.  She advised she is still not able to have visitors, but they may go out for the holidays to meet family. She states she usually flies to [place] and goes to visit her granddaughter in [place], [place] for the holidays, but she will not be doing that this year.   I advised her that my heart goes out to her during this difficult time of the pandemic in general, but also as a caregiver. I asked that she call us back if there is anything she feels we can do for her.   The patient was very appreciative for the call back. She is aware I will update Dr. Caryl Comes.

## 2018-11-23 NOTE — Telephone Encounter (Signed)
Heather °Thanks SK   °

## 2018-11-28 ENCOUNTER — Encounter: Payer: Self-pay | Admitting: Nurse Practitioner

## 2018-11-28 ENCOUNTER — Other Ambulatory Visit: Payer: Self-pay

## 2018-11-28 ENCOUNTER — Ambulatory Visit: Payer: Medicare HMO | Admitting: Nurse Practitioner

## 2018-11-28 VITALS — BP 122/68 | HR 73 | Temp 98.2°F | Ht 67.0 in | Wt 130.0 lb

## 2018-11-28 DIAGNOSIS — M720 Palmar fascial fibromatosis [Dupuytren]: Secondary | ICD-10-CM

## 2018-11-28 NOTE — Patient Instructions (Signed)
Dupuytren's Contracture Dupuytren's contracture is a condition in which tissue under the skin of the palm becomes thick. This causes one or more of the fingers to curl inward (contract) toward the palm. After a while, the fingers may not be able to straighten out. This condition affects some or all of the fingers and the palm of the hand. This condition may affect one or both hands. Dupuytren's contracture is a long-term (chronic) condition that develops (progresses) slowly over time. There is no cure, but symptoms can be managed and progression can be slowed with treatment. This condition is usually not dangerous or painful, but it can interfere with everyday tasks. What are the causes?  This condition is caused by tissue (fascia) in the palm that gets thicker and tighter. When the fascia thickens, it pulls on the cords of tissue (tendons) that control finger movement. This causes the fingers to contract. The cause of fascia thickening is not known. However, the condition is often passed along from parent to child (inherited). What increases the risk? The following factors may make you more likely to develop this condition:  Being 40 years of age or older.  Being female.  Having a family history of this condition.  Using tobacco products, including cigarettes, chewing tobacco, and e-cigarettes.  Drinking alcohol excessively.  Having diabetes.  Having a seizure disorder. What are the signs or symptoms? Early symptoms of this condition may include:  Thick, puckered skin on the hand.  One or more lumps (nodules) on the palm. Nodules may be tender when they first appear, but they are generally painless. Later symptoms of this condition may include:  Thick cords of tissue in the palm.  Fingers curled up toward the palm.  Inability to straighten the fingers into their normal position. Though this condition is usually painless, you may have discomfort when holding or grabbing objects.  How is this diagnosed? This condition is diagnosed with a physical exam, which may include:  Looking at your hands and feeling your palms. This is to check for thickened fascia and nodules.  Measuring finger motion.  Doing the [place] tabletop test. You may be asked to try to put your hand on a surface, with your palm down and your fingers straight out. How is this treated? There is no cure for this condition, but treatment can relieve discomfort and make symptoms more manageable. Treatment options may include:  Physical therapy. This can strengthen your hand and increase flexibility.  Occupational therapy. This can help you with everyday tasks that may be more difficult because of your condition.  Shots (injections). Substances may be injected into your hand, such as: ? Medicines that help to decrease swelling (corticosteroids). ? Proteins (collagenase) to weaken thick tissue. After a collagenase injection, your health care provider may stretch your fingers.  Needle aponeurotomy. A needle is pushed through the skin and into the fascia. Moving the needle against the fascia can weaken or break up the thick tissue.  Surgery. This may be needed if your condition causes discomfort or interferes with everyday activities. Physical therapy is usually needed after surgery. No treatment is guaranteed to cure this condition. Recurrence of symptoms is common. Follow these instructions at home: Hand care  Take these actions to help protect your hand from possible injury: ? Use tools that have padded grips. ? Wear protective gloves while you work with your hands. ? Avoid repetitive hand movements. General instructions  Take over-the-counter and prescription medicines only as told by your health care   provider.  Manage any other conditions that you have, such as diabetes.  If physical therapy was prescribed, do exercises as told by your health care provider.  Do not use any products that  contain nicotine or tobacco, such as cigarettes, e-cigarettes, and chewing tobacco. If you need help quitting, ask your health care provider.  If you drink alcohol: ? Limit how much you use to:  0-1 drink a day for women.  0-2 drinks a day for men. ? Be aware of how much alcohol is in your drink. In the [place] one drink equals one 12 oz bottle of beer (355 mL), one 5 oz glass of wine (148 mL), or one 1 oz glass of hard liquor (44 mL).  Keep all follow-up visits as told by your health care provider. This is important. Contact a health care provider if:  You develop new symptoms, or your symptoms get worse.  You have pain that gets worse or does not get better with medicine.  You have difficulty or discomfort with everyday tasks.  You develop numbness or tingling. Get help right away if:  You have severe pain.  Your fingers change color or become unusually cold. Summary  Dupuytren's contracture is a condition in which tissue under the skin of the palm becomes thick.  This condition is caused by tissue (fascia) that thickens. When it thickens, it pulls on the cords of tissue (tendons) that control finger movement and makes the fingers to contract.  You are more likely to develop this condition if you are a man, are over 40 years of age, have a family history of the condition, and drink a lot of alcohol.  This condition can be treated with physical and occupational therapy, injections, and surgery.  Follow instructions about how to care for your hand. Get help right away if you have severe pain or your fingers change color or become cold. This information is not intended to replace advice given to you by your health care provider. Make sure you discuss any questions you have with your health care provider. Document Released: 11/01/2008 Document Revised: 07/26/2017 Document Reviewed: 07/26/2017 Elsevier Patient Education  2020 Elsevier Inc.  

## 2018-11-28 NOTE — Progress Notes (Signed)
Careteam: Patient Care Team: Kirk Ruths, MD as PCP - General (Unknown Physician Specialty)  Advanced Directive information Does Patient Have a Medical Advance Directive?: Yes, Type of Advance Directive: Out of facility DNR (pink MOST or yellow form);Living will, Does patient want to make changes to medical advance directive?: No - Patient declined  Allergies  Allergen Reactions  . Latex Other (See Comments)    blisters  . Other Other (See Comments)    Blisters/ paper tape is Market researcher paper tape is Market researcher paper tape is Ok  Camera operator is Ok  . Tape Other (See Comments)    Blisters/ paper tape is Market researcher paper tape is Ok  . Xarelto [Rivaroxaban] Other (See Comments)    Gums bleeding and too much bruising  . Nickel Rash and Other (See Comments)    Chief Complaint  Patient presents with  . Acute Visit    Pain in pinkie finger of right hand.     HPI: Patient is a 85 y.o. female seen at [facility] For the last year right hand has gradually gotten worse.  20 years ago she had a knot that grew on the side of her left hand 3rd finger knuckle and had this removed by Dr Burney Gauze. She has another appt scheduled with him in 1 week. He is a hand specialist.  She plays the piano and unable to spread fingers out and pinkie finger is contracted, tender and now knuckle is red. Redness and tenderness has gradually been getting worse for the last year. Has not been seen at all for this.  She is unable to play the piano due to pain and contractures.  [facility] is not in patients network.   Review of Systems:  Review of Systems  Constitutional: Negative for chills, fever and weight loss.  Musculoskeletal: Positive for joint pain and myalgias.  Skin: Negative for itching and rash.  Neurological: Negative for tingling.   Past Medical History:  Diagnosis Date  . Bradycardia   . 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 CHF  (congestive heart failure) ([place])   . Hypertension   . Pacemaker 12/06/2003   Medtronic Impulse DTDR01  . Pacemaker 2015  . PAF (paroxysmal atrial fibrillation) ([facility])    a. on Coumadin and flecainide; b. CHADS2VASc 3 (HTN, age x 2, female)  . Sick sinus syndrome ([place])   . Sinoatrial node dysfunction ([facility])   . Stroke ([place])   . Vaginal intraepithelial neoplasia    Past Surgical History:  Procedure Laterality Date  . APPENDECTOMY    . HERNIA REPAIR     lower abd  . IMAGE GUIDED SINUS SURGERY  2015  . INSERT / REPLACE / REMOVE PACEMAKER  12/06/2003   Medtronic impulse DTDR01  . JOINT REPLACEMENT Right    knee  . KYPHOPLASTY N/A 06/23/2017   Procedure: [id number];  Surgeon: Hessie Knows, MD;  Location: [facility] ORS;  Service: Orthopedics;  Laterality: N/A;  . LEEP  1999  . ORIF HIP FRACTURE Right 02/2016  . PACEMAKER GENERATOR CHANGE N/A 09/18/2012   Procedure: PACEMAKER GENERATOR CHANGE;  Surgeon: Deboraha Sprang, MD;  Location: [facility] CATH LAB;  Service: Cardiovascular;  Laterality: N/A;  . TONSILLECTOMY    . TOTAL KNEE ARTHROPLASTY    . TOTAL KNEE REVISION Right 09/23/2016   Procedure: TOTAL KNEE REVISION-POLYETHYLENE EXCHANGE;  Surgeon: Hessie Knows, MD;  Location: [facility] ORS;  Service: Orthopedics;  Laterality: Right;  Marland Kitchen VAGINAL  HYSTERECTOMY     Social History:   reports that she has never smoked. She has never used smokeless tobacco. She reports current alcohol use of about 1.0 standard drinks of alcohol per week. She reports that she does not use drugs.  Family History  Problem Relation Age of Onset  . Stroke Mother   . Heart disease Father   . Diabetes Daughter   . Diabetes Son     Medications: Patient's Medications  New Prescriptions   No medications on file  Previous Medications   ACETAMINOPHEN (TYLENOL) 500 MG TABLET    Take 500 mg by mouth every 8 (eight) hours as needed for mild pain or headache.   ELIQUIS 2.5 MG TABS TABLET    TAKE 1 TABLET TWICE DAILY   FLUTICASONE  (FLONASE) 50 MCG/ACT NASAL SPRAY    Place 2 sprays into both nostrils daily as needed for allergies or rhinitis.    FUROSEMIDE (LASIX) 40 MG TABLET    Take 1 tablet (40 mg total) by mouth daily.   IBANDRONATE (BONIVA) 150 MG TABLET    Take 150 mg by mouth every 30 (thirty) days. Take in the morning with a full glass of water, on an empty stomach, and do not take anything else by mouth or lie down for the next 30 min.   LEVOTHYROXINE (SYNTHROID) 25 MCG TABLET    Take 25 mcg by mouth daily before breakfast.    MIRTAZAPINE (REMERON) 15 MG TABLET    Take 1 tablet (15 mg) by mouth once daily at bedtime   MULTIPLE VITAMINS-MINERALS (SENIOR MULTIVITAMIN PLUS PO)    Take by mouth.   NADOLOL (CORGARD) 40 MG TABLET    Take 1 tablet (40 mg) by mouth twice daily   POTASSIUM CHLORIDE SA (KLOR-CON) 20 MEQ TABLET    Take 10 mEq by mouth 2 (two) times daily. Take 1/2 tablet QAM and 1/2 tablet QPM  Modified Medications   No medications on file  Discontinued Medications   OVER THE COUNTER MEDICATION    Place 1 spray into the nose daily as needed (for nasal congestion). [facility]Med Sinus Rinse    POTASSIUM CHLORIDE ER 20 MEQ TBCR    Take 10 mEq by mouth daily.    Physical Exam:  Vitals:   11/28/18 1141  BP: 122/68  Pulse: 73  Temp: 98.2 F (36.8 C)  TempSrc: Oral  SpO2: 97%  Weight: 130 lb (59 kg)  Height: 5\' 7"  (1.702 m)   Body mass index is 20.36 kg/m. Wt Readings from Last 3 Encounters:  11/28/18 130 lb (59 kg)  10/31/18 133 lb 8 oz (60.6 kg)  08/23/18 130 lb 2 oz (59 kg)    Physical Exam Constitutional:      Appearance: Normal appearance.     Comments: Thin female  HENT:     Head: Normocephalic and atraumatic.  Musculoskeletal:     Right hand: She exhibits decreased range of motion, tenderness and deformity. Decreased strength noted. She exhibits finger abduction.  Skin:    General: Skin is warm and dry.     Capillary Refill: Capillary refill takes less than 2 seconds.  Neurological:      General: No focal deficit present.     Mental Status: She is alert and oriented to person, place, and time.  Psychiatric:        Mood and Affect: Mood normal.        Behavior: Behavior normal.     Labs reviewed: Basic Metabolic Panel: Recent  Labs    11/28/17 2014 11/29/17 0448 11/30/17 0443 02/09/18 1259  NA  --  133* 135 134*  K  --  4.3 3.7 4.2  CL  --  102 99 98  CO2  --  20* 25 29  GLUCOSE  --  173* 115* 100*  BUN  --  22 23 30*  CREATININE  --  0.72 0.82 0.95  CALCIUM  --  8.4* 8.4* 9.0  TSH 4.700*  --   --   --    Liver Function Tests: Recent Labs    11/28/17 1454  AST 33  ALT 27  ALKPHOS 99  BILITOT 0.8  PROT 8.1  ALBUMIN 3.6   No results for input(s): LIPASE, AMYLASE in the last 8760 hours. No results for input(s): AMMONIA in the last 8760 hours. CBC: Recent Labs    11/28/17 1454 02/09/18 1259  WBC 7.5 8.8  NEUTROABS 5.1  --   HGB 12.3 11.8*  HCT 38.6 37.8  MCV 89.1 89.8  PLT 224 220   Lipid Panel: Recent Labs    02/10/18 0440  CHOL 157  HDL 62  LDLCALC 83  TRIG 61  CHOLHDL 2.5   TSH: Recent Labs    11/28/17 2014  TSH 4.700*   A1C: Lab Results  Component Value Date   HGBA1C 6.7 (H) 02/10/2018     Assessment/Plan 1. Dupuytren's contracture of right hand Progressive worsening of contracture of right and and 5th finger. Has upcoming appt scheduled with hand specialist. Given confirmation that this is who she should be seeing for this issue.  She will likely need some form of PT due to this but [facility] is not in network so she will get referral from ortho office when hand in evaluated if he chooses to refer her.   Carlos American. [place], [facility] [phone]

## 2018-12-05 DIAGNOSIS — M720 Palmar fascial fibromatosis [Dupuytren]: Secondary | ICD-10-CM | POA: Diagnosis not present

## 2018-12-08 ENCOUNTER — Other Ambulatory Visit: Payer: Self-pay | Admitting: Internal Medicine

## 2018-12-08 ENCOUNTER — Other Ambulatory Visit: Payer: Self-pay

## 2018-12-08 ENCOUNTER — Ambulatory Visit (INDEPENDENT_AMBULATORY_CARE_PROVIDER_SITE_OTHER): Payer: Medicare HMO

## 2018-12-08 DIAGNOSIS — I480 Paroxysmal atrial fibrillation: Secondary | ICD-10-CM

## 2018-12-11 ENCOUNTER — Telehealth: Payer: Self-pay | Admitting: Internal Medicine

## 2018-12-11 NOTE — Telephone Encounter (Signed)
The patient was in the office on Friday 11/20 for an echo to be done for Dr. Caryl Comes. She asked me to come into the room to advise me she is scheduled to have hand surgery on 01/03/19 with Dr. Burney Gauze. She states she was told she would just be having a nerve block but wanted to ensure with Dr. Caryl Comes that there was no issue with this and heart.  I asked the patient if the surgeon's office was requesting a cardiac clearance and she advised they were not, but she needed to know for her own peace of mind, what Dr. Olin Pia thoughts were.  I advised the patient I would call Dr. Bertis Ruddy office to confirm.  I was able to call Dr. Bertis Ruddy office today and was told by the surgery scheduler that she will just be having MAC & a ancillary block. They are not requesting she hold eliquis prior to her procedure.  I will forward this to Dr. Caryl Comes prior to calling the patient back.

## 2018-12-11 NOTE — Telephone Encounter (Signed)
The patient called the office back after her last office visit with Dr. Caryl Comes and advised she was taking nadolol 40 mg BID.  He was ok with her continuing nadolol 40 mg BID.

## 2018-12-11 NOTE — Telephone Encounter (Signed)
Please advise if ok to refill Nadolol 40 mg tablet bid.  Pt due for ov with Gollan and pt see's Dr.Klein as well.

## 2018-12-13 ENCOUNTER — Encounter: Payer: Self-pay | Admitting: *Deleted

## 2018-12-13 NOTE — Telephone Encounter (Signed)
Good for her-- we had talked about this the last time in the office cause she couldn't reach an octave on the piano and I had encouraged her to find a hand guy Happy thanksgiving to her and you

## 2018-12-13 NOTE — Telephone Encounter (Signed)
I spoke with the patient and advised her Dr. Caryl Comes had reviewed the message I sent him about her upcoming hand surgery and he voiced no concerns from a cardiac perspective for her to have this done.

## 2018-12-20 IMAGING — CT CT KNEE*R* W/O CM
1 series · 12 of 14 positions shown, 15 images · non-contrast
Comparison: None.

CLINICAL DATA: Chronic medial and lateral knee pain without
swelling. Limited range of motion. Knee replacement x3.

EXAM:
CT OF THE RIGHT KNEE WITHOUT CONTRAST
TECHNIQUE: Multidetector CT imaging of the RIGHT knee was performed according
to the standard protocol. Multiplanar CT image reconstructions were
also generated.

[Series 8: axial st · axial · 0.30mm/px · z∈[+322,+507]mm · 12 of 219 slices shown, 15 images]
[im 17/219  soft-tissue]
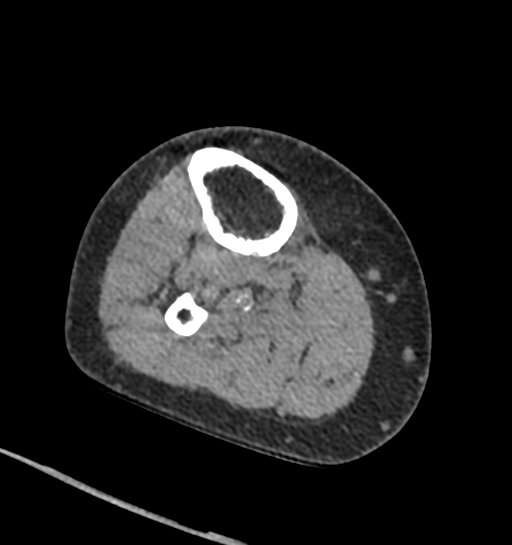
[im 17/219  bone]
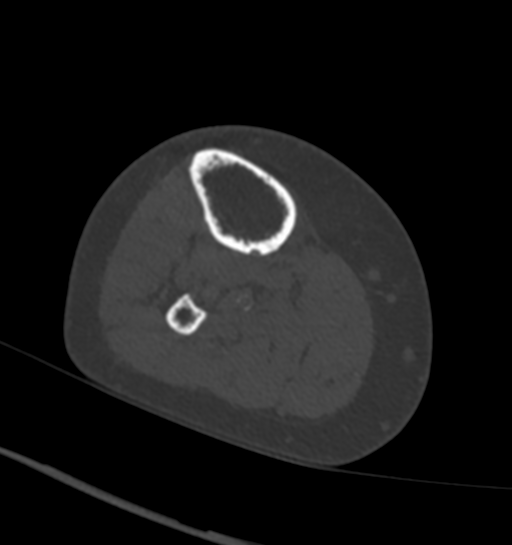
[im 34/219  bone]
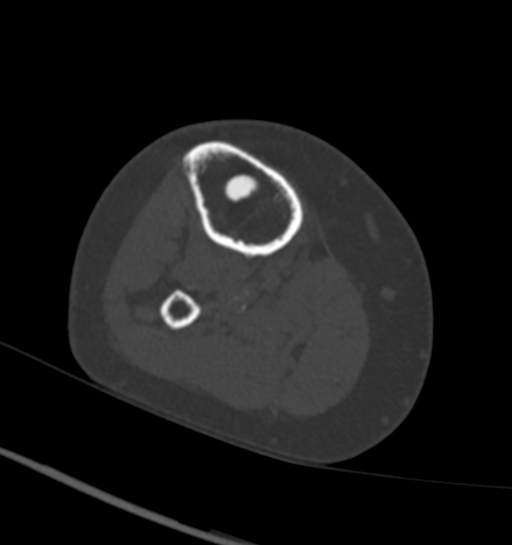
[im 51/219  bone]
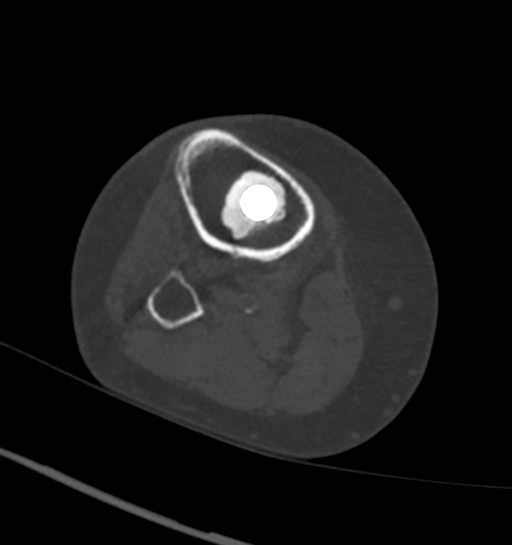
[im 68/219  bone]
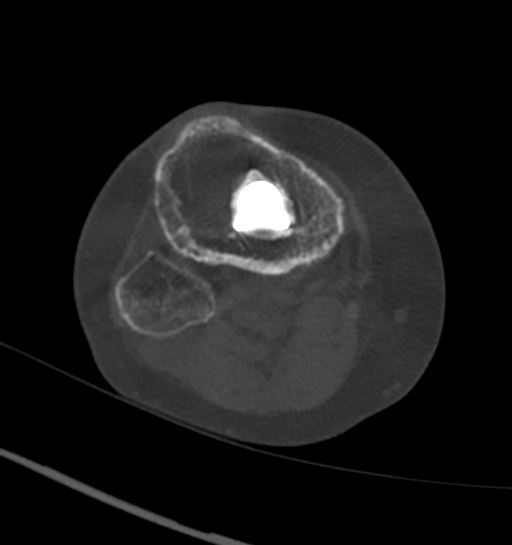
[im 84/219  soft-tissue]
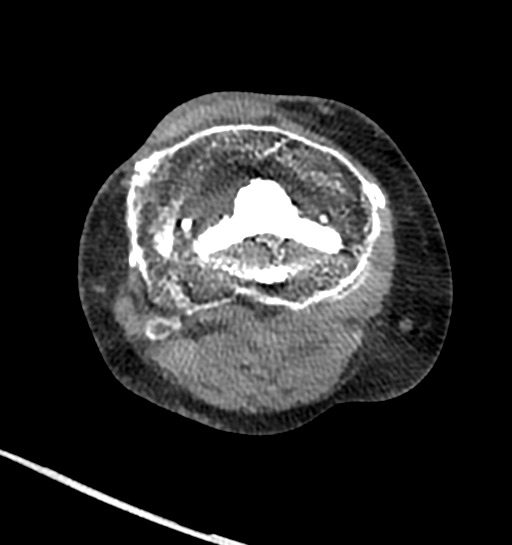
[im 84/219  bone]
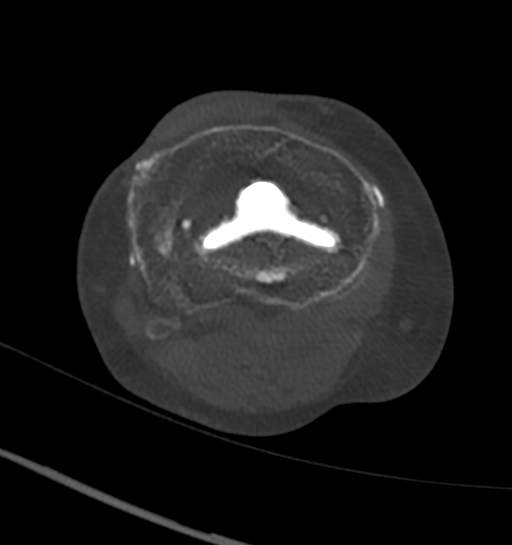
[im 101/219  bone]
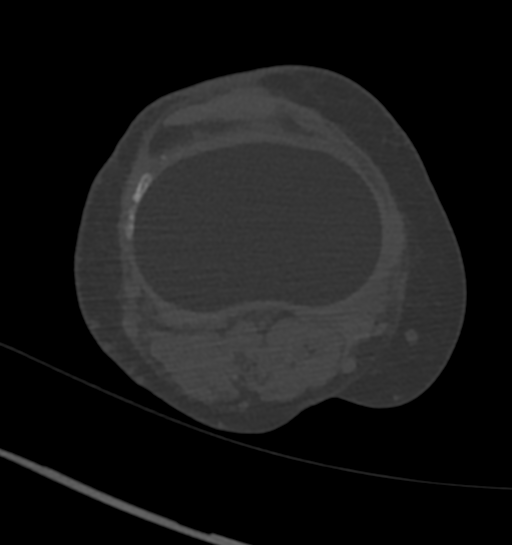
[im 118/219  bone]
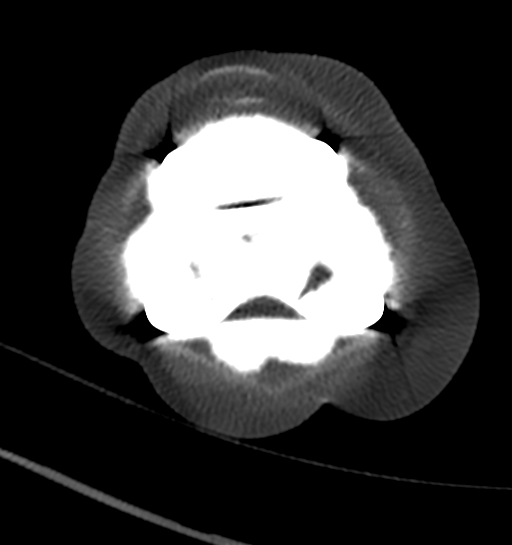
[im 135/219  bone]
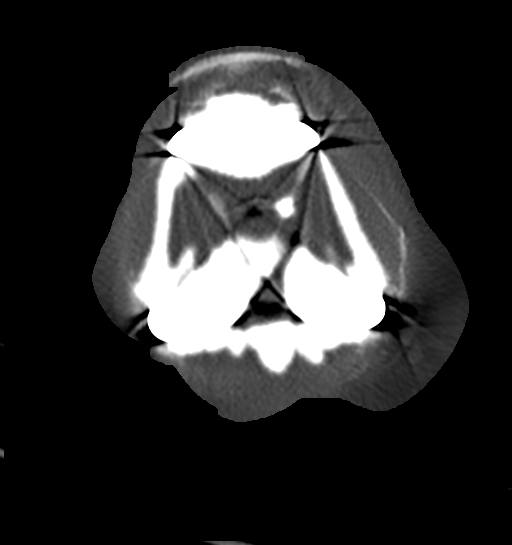
[im 151/219  soft-tissue]
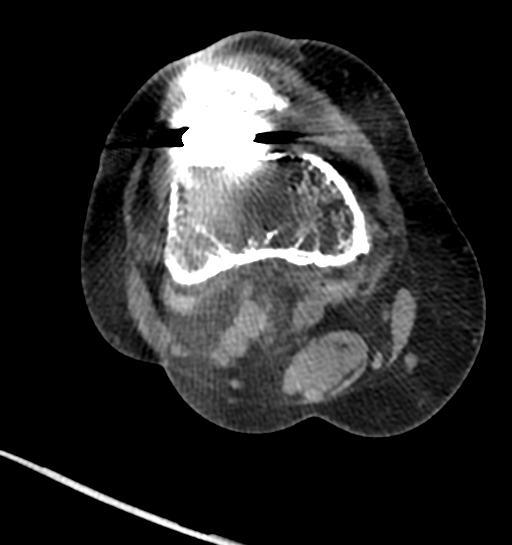
[im 151/219  bone]
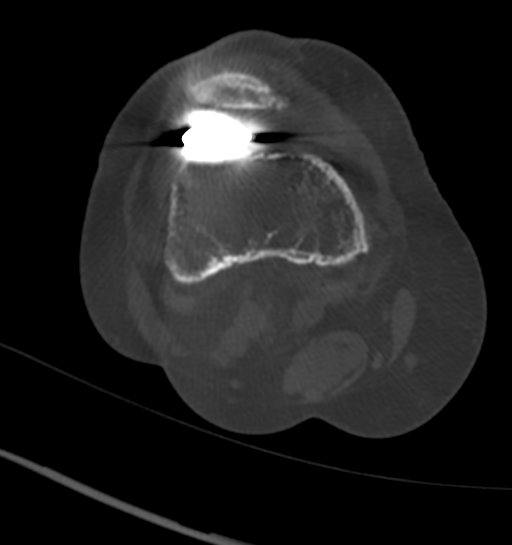
[im 168/219  bone]
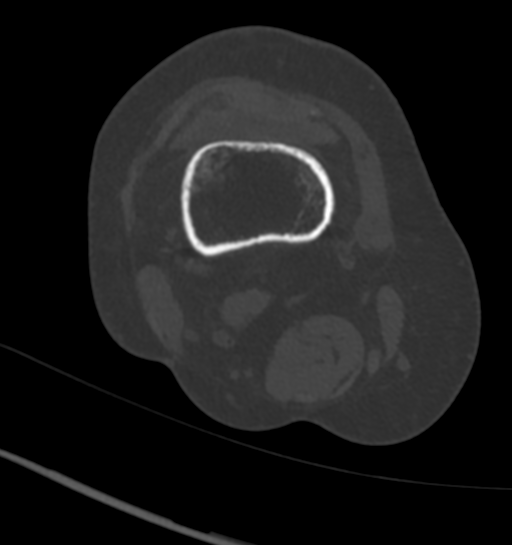
[im 185/219  bone]
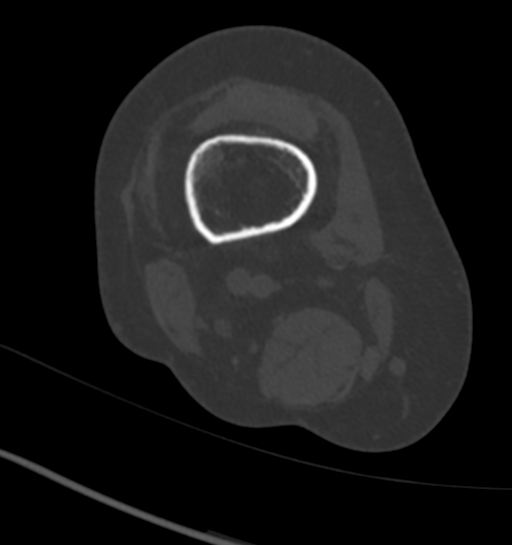
[im 202/219  bone]
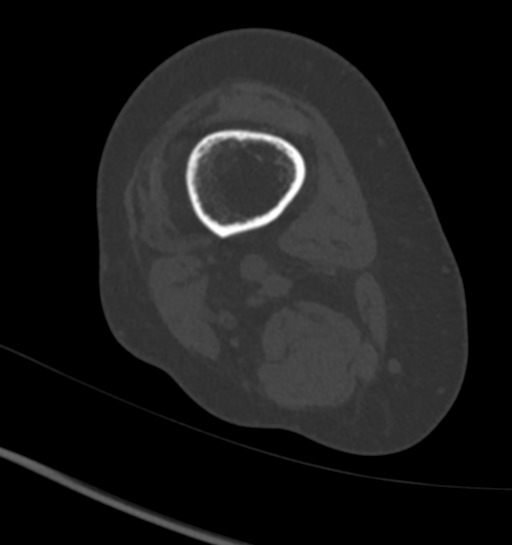

[12 of 14 positions shown; findings below may reference images not displayed]

FINDINGS: Bones/Joint/Cartilage

Streak artifacts from the patient's indwelling right total knee
arthroplasty limits assessment. No conclusive evidence for hardware
loosening or failure. No lucencies are seen at the cement-prosthetic
interface. Small suprapatellar joint effusion is partially imaged
with a few scattered punctate metallic densities likely secondary to
prior surgeries. No focal bony lucency or fracture is identified. No
soft tissue mass.

Ligaments

Suboptimally assessed by CT.

Muscles and Tendons

No intramuscular hemorrhage or mass.  No atrophy.

Soft tissues

No significant soft swelling.
IMPRESSION: 1. No CT evidence of hardware failure or loosening of an indwelling
right total knee arthroplasty.
2. No acute fracture, bone destruction or malalignment of the right
knee. Small suprapatellar joint effusion is partially imaged.

## 2018-12-21 ENCOUNTER — Other Ambulatory Visit: Payer: Self-pay | Admitting: Internal Medicine

## 2018-12-22 NOTE — Telephone Encounter (Signed)
Prescription refill request for Eliquis received.  Last office visit: 10/13/2020[name] Scr: 0.95, 02/09/2018 Age: 85 y.o. Weight: 59 kg  Prescription refill sent.

## 2019-01-02 DIAGNOSIS — M720 Palmar fascial fibromatosis [Dupuytren]: Secondary | ICD-10-CM | POA: Diagnosis not present

## 2019-01-03 DIAGNOSIS — M720 Palmar fascial fibromatosis [Dupuytren]: Secondary | ICD-10-CM | POA: Diagnosis not present

## 2019-01-09 DIAGNOSIS — M720 Palmar fascial fibromatosis [Dupuytren]: Secondary | ICD-10-CM | POA: Diagnosis not present

## 2019-01-09 DIAGNOSIS — M25641 Stiffness of right hand, not elsewhere classified: Secondary | ICD-10-CM | POA: Diagnosis not present

## 2019-01-09 DIAGNOSIS — M79644 Pain in right finger(s): Secondary | ICD-10-CM | POA: Diagnosis not present

## 2019-01-18 ENCOUNTER — Ambulatory Visit (INDEPENDENT_AMBULATORY_CARE_PROVIDER_SITE_OTHER): Payer: Medicare HMO | Admitting: *Deleted

## 2019-01-18 DIAGNOSIS — Z950 Presence of cardiac pacemaker: Secondary | ICD-10-CM | POA: Diagnosis not present

## 2019-01-19 LAB — CUP PACEART REMOTE DEVICE CHECK
Battery Impedance: 1018 Ohm
Battery Remaining Longevity: 62 mo
Battery Voltage: 2.78 V
Brady Statistic AP VP Percent: 6 %
Brady Statistic AP VS Percent: 57 %
Brady Statistic AS VP Percent: 2 %
Brady Statistic AS VS Percent: 36 %
Date Time Interrogation Session: 20201231171246
Implantable Lead Implant Date: 20051130
Implantable Lead Implant Date: 20051130
Implantable Lead Location: 753859
Implantable Lead Location: 753860
Implantable Lead Model: 5076
Implantable Pulse Generator Implant Date: 20140901
Lead Channel Impedance Value: 630 Ohm
Lead Channel Impedance Value: 658 Ohm
Lead Channel Pacing Threshold Amplitude: 0.75 V
Lead Channel Pacing Threshold Amplitude: 1.125 V
Lead Channel Pacing Threshold Pulse Width: 0.4 ms
Lead Channel Pacing Threshold Pulse Width: 0.4 ms
Lead Channel Setting Pacing Amplitude: 2 V
Lead Channel Setting Pacing Amplitude: 2.5 V
Lead Channel Setting Pacing Pulse Width: 0.46 ms
Lead Channel Setting Sensing Sensitivity: 5.6 mV

## 2019-01-31 DIAGNOSIS — R0982 Postnasal drip: Secondary | ICD-10-CM | POA: Diagnosis not present

## 2019-01-31 DIAGNOSIS — J320 Chronic maxillary sinusitis: Secondary | ICD-10-CM | POA: Diagnosis not present

## 2019-02-12 DIAGNOSIS — M79644 Pain in right finger(s): Secondary | ICD-10-CM | POA: Diagnosis not present

## 2019-02-12 DIAGNOSIS — M25641 Stiffness of right hand, not elsewhere classified: Secondary | ICD-10-CM | POA: Diagnosis not present

## 2019-02-12 DIAGNOSIS — M720 Palmar fascial fibromatosis [Dupuytren]: Secondary | ICD-10-CM | POA: Diagnosis not present

## 2019-04-19 ENCOUNTER — Ambulatory Visit (INDEPENDENT_AMBULATORY_CARE_PROVIDER_SITE_OTHER): Payer: Medicare HMO | Admitting: *Deleted

## 2019-04-19 DIAGNOSIS — Z950 Presence of cardiac pacemaker: Secondary | ICD-10-CM | POA: Diagnosis not present

## 2019-04-19 LAB — CUP PACEART REMOTE DEVICE CHECK
Battery Impedance: 1074 Ohm
Battery Remaining Longevity: 59 mo
Battery Voltage: 2.78 V
Brady Statistic AP VP Percent: 6 %
Brady Statistic AP VS Percent: 60 %
Brady Statistic AS VP Percent: 2 %
Brady Statistic AS VS Percent: 33 %
Date Time Interrogation Session: 20210401184950
Implantable Lead Implant Date: 20051130
Implantable Lead Implant Date: 20051130
Implantable Lead Location: 753859
Implantable Lead Location: 753860
Implantable Lead Model: 5076
Implantable Pulse Generator Implant Date: 20140901
Lead Channel Impedance Value: 589 Ohm
Lead Channel Impedance Value: 605 Ohm
Lead Channel Pacing Threshold Amplitude: 0.625 V
Lead Channel Pacing Threshold Amplitude: 1.25 V
Lead Channel Pacing Threshold Pulse Width: 0.4 ms
Lead Channel Pacing Threshold Pulse Width: 0.4 ms
Lead Channel Setting Pacing Amplitude: 2 V
Lead Channel Setting Pacing Amplitude: 2.5 V
Lead Channel Setting Pacing Pulse Width: 0.46 ms
Lead Channel Setting Sensing Sensitivity: 5.6 mV

## 2019-04-19 NOTE — Progress Notes (Signed)
PPM Remote  

## 2019-05-02 DIAGNOSIS — E7800 Pure hypercholesterolemia, unspecified: Secondary | ICD-10-CM | POA: Diagnosis not present

## 2019-05-02 DIAGNOSIS — I480 Paroxysmal atrial fibrillation: Secondary | ICD-10-CM | POA: Diagnosis not present

## 2019-05-09 DIAGNOSIS — E7800 Pure hypercholesterolemia, unspecified: Secondary | ICD-10-CM | POA: Diagnosis not present

## 2019-05-09 DIAGNOSIS — E039 Hypothyroidism, unspecified: Secondary | ICD-10-CM | POA: Diagnosis not present

## 2019-05-09 DIAGNOSIS — Z9989 Dependence on other enabling machines and devices: Secondary | ICD-10-CM | POA: Diagnosis not present

## 2019-05-09 DIAGNOSIS — I779 Disorder of arteries and arterioles, unspecified: Secondary | ICD-10-CM | POA: Diagnosis not present

## 2019-05-09 DIAGNOSIS — I5022 Chronic systolic (congestive) heart failure: Secondary | ICD-10-CM | POA: Diagnosis not present

## 2019-05-09 DIAGNOSIS — I110 Hypertensive heart disease with heart failure: Secondary | ICD-10-CM | POA: Diagnosis not present

## 2019-05-09 DIAGNOSIS — I480 Paroxysmal atrial fibrillation: Secondary | ICD-10-CM | POA: Diagnosis not present

## 2019-05-16 ENCOUNTER — Other Ambulatory Visit: Payer: Self-pay | Admitting: Internal Medicine

## 2019-05-16 NOTE — Telephone Encounter (Signed)
Pt's age 86, wt 59kg, SCr 0.9, CrCl 43.34, last ov w/ SK 10/31/18. Since pt's age >73 & wt <60 kg, Eliquis dosage of 2.5 mg BID is appropriate.

## 2019-07-19 ENCOUNTER — Telehealth: Payer: Self-pay

## 2019-07-19 ENCOUNTER — Ambulatory Visit (INDEPENDENT_AMBULATORY_CARE_PROVIDER_SITE_OTHER): Payer: Medicare HMO | Admitting: *Deleted

## 2019-07-19 DIAGNOSIS — I495 Sick sinus syndrome: Secondary | ICD-10-CM | POA: Diagnosis not present

## 2019-07-19 LAB — CUP PACEART REMOTE DEVICE CHECK
Battery Impedance: 1129 Ohm
Battery Remaining Longevity: 58 mo
Battery Voltage: 2.77 V
Brady Statistic AP VP Percent: 7 %
Brady Statistic AP VS Percent: 58 %
Brady Statistic AS VP Percent: 2 %
Brady Statistic AS VS Percent: 33 %
Date Time Interrogation Session: 20210701100312
Implantable Lead Implant Date: 20051130
Implantable Lead Implant Date: 20051130
Implantable Lead Location: 753859
Implantable Lead Location: 753860
Implantable Lead Model: 5076
Implantable Pulse Generator Implant Date: 20140901
Lead Channel Impedance Value: 592 Ohm
Lead Channel Impedance Value: 599 Ohm
Lead Channel Pacing Threshold Amplitude: 0.625 V
Lead Channel Pacing Threshold Amplitude: 1.25 V
Lead Channel Pacing Threshold Pulse Width: 0.4 ms
Lead Channel Pacing Threshold Pulse Width: 0.4 ms
Lead Channel Setting Pacing Amplitude: 2 V
Lead Channel Setting Pacing Amplitude: 2.5 V
Lead Channel Setting Pacing Pulse Width: 0.46 ms
Lead Channel Setting Sensing Sensitivity: 5.6 mV

## 2019-07-19 NOTE — Telephone Encounter (Signed)
[facility] scheduled remote reviewed for 7/1/21for increasing AF burden (5.4%) with well controlled VR's. Longest AF episode 3 hours 22 minutes.   Called patient to assess. States she has been under a lot of stress due to her husband being sick. States she has been feeling ok just under a lot of stress. Patient reports of compliance with Eliquis and Nadolol. Advised patient she missed apt. In April of 2021, patient was unaware of apt. Advised patient I will forward his information to scheduling who will call her to set up apt. Patient agreeable and verbalizes understanding. Advised patient if anything changes or she has any questions to please call the DC. Direct number provided. Verbalizes understanding.

## 2019-07-20 NOTE — Progress Notes (Signed)
Remote pacemaker transmission.   

## 2019-07-23 NOTE — Telephone Encounter (Signed)
H  good am Do you have a minute ( or 10) to calll mrs olson and see how she is and if there is anything we can do Thanks SK

## 2019-07-24 NOTE — Telephone Encounter (Signed)
I called and spoke with the patient. I advised her I was calling to check on her at the request of Dr. Caryl Comes. The patient states she is doing "ok." She attributes a lot of how she is feeling to what is going on with her husband.  She is now able to see him everyday.   I advised the patient she was overdue for follow up with Dr. Caryl Comes and I could go ahead and schedule this now.  She is agreeable to an appointment on 08/07/19 at 8:40 am.   To Dr. Caryl Comes as an Juluis Rainier.

## 2019-08-01 DIAGNOSIS — R0982 Postnasal drip: Secondary | ICD-10-CM | POA: Diagnosis not present

## 2019-08-01 DIAGNOSIS — J320 Chronic maxillary sinusitis: Secondary | ICD-10-CM | POA: Diagnosis not present

## 2019-08-07 ENCOUNTER — Ambulatory Visit (INDEPENDENT_AMBULATORY_CARE_PROVIDER_SITE_OTHER): Payer: Medicare HMO | Admitting: Internal Medicine

## 2019-08-07 ENCOUNTER — Other Ambulatory Visit: Payer: Self-pay

## 2019-08-07 ENCOUNTER — Encounter: Payer: Self-pay | Admitting: Internal Medicine

## 2019-08-07 VITALS — BP 146/85 | HR 77 | Ht 66.0 in | Wt 133.5 lb

## 2019-08-07 DIAGNOSIS — I480 Paroxysmal atrial fibrillation: Secondary | ICD-10-CM | POA: Diagnosis not present

## 2019-08-07 DIAGNOSIS — I5022 Chronic systolic (congestive) heart failure: Secondary | ICD-10-CM | POA: Diagnosis not present

## 2019-08-07 DIAGNOSIS — Z950 Presence of cardiac pacemaker: Secondary | ICD-10-CM

## 2019-08-07 DIAGNOSIS — I495 Sick sinus syndrome: Secondary | ICD-10-CM | POA: Diagnosis not present

## 2019-08-07 DIAGNOSIS — I10 Essential (primary) hypertension: Secondary | ICD-10-CM | POA: Diagnosis not present

## 2019-08-07 NOTE — Patient Instructions (Signed)

## 2019-08-07 NOTE — Progress Notes (Signed)
Patient Care Team: Kirk Ruths, MD as PCP - General (Unknown Physician Specialty)   HPI  Alejandra Wiley is a 86 y.o. female seen in followup for palpitations associated with atrial arrhythmias as well as bradycardia requiring backup bradycardia pacing. She previously took flecainide.  Resulted in prolonged AV nodal conduction and ventricular pacing.  Deterioration of LV function; flecainide discontinued with subsequent normalization of LV function.    Date VP%  3/14 1  3/*16 95  10/17 95  10/18 92-DDD-DDI  6/19 68  10/19 84  4/20  3.3  7/21 9     DATE TEST EF Vp %    /13 Echo  55-65%    8/19 Echo  50-55% 84%  1/20 Echo   30-35%   11/21 Echo  55-60      Date Cr Hgb  8/18 0.67 12.2  9/18 0.64 10  1/20 0.95 11.8  6/20 0.9 11.7   Hospitalized 8/19 with syncope, thought prob orthostatic with objective evidence variable, ( one day no change, the next day 30 + HR increase.)   Also preceded by atrial fib which might have contributed to assoc weakness and fatigue and excessive rates assoc with walking>> rate response reprogrammed   Flecainide was increased   Pacemaker interrogation >> atrial failure to capture and device reprogrammed; also leading to uninterpretable data re Atrial arrhythmia  1/20 Hospitalized with stroke- R sided weakness.  INR subtherapeutic 1.52.  >>imaging  CT  No acute stroke.  Carotids neg.  Coumadin d/c and apixoban started ( 2.5 mg bid -wt 55 kg;age->82)   Interval telehealth visit is notable for orthostasis prompting down titration of her nadolol  The patient denies chest pain, shortness of breath, nocturnal dyspnea, orthopnea or peripheral edema.  There have been no palpitations, lightheadedness or syncope  [place]  Significant improvement.  Husband situation continues to deteriorate.  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 on Coumadin and flecainide; b. CHADS2VASc 3 (HTN, age x 2, female)   Sick sinus syndrome ([place])    Sinoatrial node dysfunction ([place])    Stroke ([place])    Vaginal intraepithelial neoplasia     Past Surgical History:  Procedure Laterality Date   APPENDECTOMY     HERNIA REPAIR     lower abd   IMAGE GUIDED SINUS SURGERY  2015   INSERT / REPLACE / REMOVE PACEMAKER  12/06/2003   Medtronic impulse DTDR01   JOINT REPLACEMENT Right    knee   KYPHOPLASTY N/A 06/23/2017   Procedure: [id number];  Surgeon: Hessie Knows, MD;  Location: [facility] ORS;  Service: Orthopedics;  Laterality: N/A;   LEEP  1999   ORIF HIP FRACTURE Right 02/2016  PACEMAKER GENERATOR CHANGE N/A 09/18/2012   Procedure: PACEMAKER GENERATOR CHANGE;  Surgeon: Deboraha Sprang, MD;  Location: [facility] CATH LAB;  Service: Cardiovascular;  Laterality: N/A;   TONSILLECTOMY     TOTAL KNEE ARTHROPLASTY     TOTAL KNEE REVISION Right 09/23/2016   Procedure: TOTAL KNEE REVISION-POLYETHYLENE EXCHANGE;  Surgeon: Hessie Knows, MD;  Location: [facility] ORS;  Service: Orthopedics;  Laterality: Right;   VAGINAL HYSTERECTOMY      Current Outpatient Medications  Medication Sig Dispense Refill   acetaminophen (TYLENOL) 500 MG tablet Take 500 mg by mouth every 8 (eight) hours as needed for mild pain or headache.     ELIQUIS 2.5 MG TABS tablet TAKE 1 TABLET TWICE DAILY 180 tablet 1   fluticasone (FLONASE) 50 MCG/ACT nasal spray Place 2  sprays into both nostrils daily as needed for allergies or rhinitis.      furosemide (LASIX) 40 MG tablet Take 1 tablet (40 mg total) by mouth daily. 30 tablet 0   ibandronate (BONIVA) 150 MG tablet Take 150 mg by mouth every 30 (thirty) days. Take in the morning with a full glass of water, on an empty stomach, and do not take anything else by mouth or lie down for the next 30 min.     mirtazapine (REMERON) 15 MG tablet Take 1 tablet (15 mg) by mouth once daily at bedtime     Multiple Vitamins-Minerals (SENIOR MULTIVITAMIN PLUS PO) Take by mouth.     nadolol (CORGARD) 40 MG tablet TAKE 1 TABLET (40 MG) BY MOUTH TWICE DAILY 180 tablet 3   potassium chloride SA (KLOR-CON) 20 MEQ tablet Take 10 mEq by mouth 2 (two) times daily. Take 1/2 tablet QAM and 1/2 tablet QPM     No current facility-administered medications for this visit.    Allergies  Allergen Reactions   Latex Other (See Comments)    blisters   Other Other (See Comments)    Blisters/ paper tape is Market researcher paper tape is Market researcher paper tape is Clinical research associate paper tape is Ok   Adult nurse (See Comments)    Blisters/ paper tape is Market researcher paper tape is Ok   Xarelto [Rivaroxaban] Other (See Comments)    Gums bleeding and too much bruising   Nickel Rash and Other (See Comments)    Review of Systems negative except from HPI and PMH  Physical Exam BP (!) 146/85 (BP Location: Left Arm, Patient Position: Sitting, Cuff Size: Normal)    Pulse 77    Ht 5\' 6"  (1.676 m)    Wt 133 lb 8 oz (60.6 kg)    BMI 21.55 kg/m  Well developed and well nourished in no acute distress HENT normal Neck supple with JVP-flat Clear Device pocket well healed; without hematoma or erythema.  There is no tethering  Regular rate and rhythm, no  gallop No murmur Abd-soft with active BS No Clubbing cyanosis   edema Skin-warm and dry A & Oriented  Grossly normal sensory and motor function  ECG atrial paced at 77 with frequent PACs  with intrinsic conduction and rare ventricular pacing  Assessment and  Plan  Atrial arrhythmia/Fib  Sinus node dysfunction  Hypertension  Syncope  Pacemaker  Medtronic  Dual chamber   Elevated pacing thresholds A Greater    HFpEF>> LV dysfunction resolved  Orthostatic hypotension  1 AVB    Much improved. Euvolemic continue current meds  Systolic blood pressure little bit elevated; however, with her history of orthostasis would  prefer it this way as opposed to lowering it and leaving her risk for syncope.  No atrial fibrillation of which she is aware; minimal amount of atrial tachycardia noted on her device.  On Anticoagulation;  No bleeding issues

## 2019-10-18 ENCOUNTER — Ambulatory Visit: Payer: Medicare HMO

## 2019-10-20 LAB — CUP PACEART REMOTE DEVICE CHECK
Battery Impedance: 1236 Ohm
Battery Remaining Longevity: 53 mo
Battery Voltage: 2.77 V
Brady Statistic AP VP Percent: 18 %
Brady Statistic AP VS Percent: 46 %
Brady Statistic AS VP Percent: 2 %
Brady Statistic AS VS Percent: 34 %
Date Time Interrogation Session: 20211001102621
Implantable Lead Implant Date: 20051130
Implantable Lead Implant Date: 20051130
Implantable Lead Location: 753859
Implantable Lead Location: 753860
Implantable Lead Model: 5076
Implantable Pulse Generator Implant Date: 20140901
Lead Channel Impedance Value: 599 Ohm
Lead Channel Impedance Value: 613 Ohm
Lead Channel Pacing Threshold Amplitude: 0.625 V
Lead Channel Pacing Threshold Amplitude: 1 V
Lead Channel Pacing Threshold Pulse Width: 0.4 ms
Lead Channel Pacing Threshold Pulse Width: 0.4 ms
Lead Channel Setting Pacing Amplitude: 2 V
Lead Channel Setting Pacing Amplitude: 2.5 V
Lead Channel Setting Pacing Pulse Width: 0.46 ms
Lead Channel Setting Sensing Sensitivity: 5.6 mV

## 2019-11-06 DIAGNOSIS — E039 Hypothyroidism, unspecified: Secondary | ICD-10-CM | POA: Diagnosis not present

## 2019-11-06 DIAGNOSIS — I10 Essential (primary) hypertension: Secondary | ICD-10-CM | POA: Diagnosis not present

## 2019-11-06 DIAGNOSIS — I5022 Chronic systolic (congestive) heart failure: Secondary | ICD-10-CM | POA: Diagnosis not present

## 2019-11-13 DIAGNOSIS — I110 Hypertensive heart disease with heart failure: Secondary | ICD-10-CM | POA: Diagnosis not present

## 2019-11-13 DIAGNOSIS — E7800 Pure hypercholesterolemia, unspecified: Secondary | ICD-10-CM | POA: Diagnosis not present

## 2019-11-13 DIAGNOSIS — I480 Paroxysmal atrial fibrillation: Secondary | ICD-10-CM | POA: Diagnosis not present

## 2019-11-13 DIAGNOSIS — E039 Hypothyroidism, unspecified: Secondary | ICD-10-CM | POA: Diagnosis not present

## 2019-11-13 DIAGNOSIS — Z0000 Encounter for general adult medical examination without abnormal findings: Secondary | ICD-10-CM | POA: Diagnosis not present

## 2019-11-13 DIAGNOSIS — I779 Disorder of arteries and arterioles, unspecified: Secondary | ICD-10-CM | POA: Diagnosis not present

## 2019-11-13 DIAGNOSIS — Z23 Encounter for immunization: Secondary | ICD-10-CM | POA: Diagnosis not present

## 2019-11-13 DIAGNOSIS — I5022 Chronic systolic (congestive) heart failure: Secondary | ICD-10-CM | POA: Diagnosis not present

## 2019-12-05 DIAGNOSIS — Z96651 Presence of right artificial knee joint: Secondary | ICD-10-CM | POA: Diagnosis not present

## 2019-12-05 DIAGNOSIS — M25561 Pain in right knee: Secondary | ICD-10-CM | POA: Diagnosis not present

## 2019-12-05 DIAGNOSIS — G8929 Other chronic pain: Secondary | ICD-10-CM | POA: Diagnosis not present

## 2019-12-07 ENCOUNTER — Other Ambulatory Visit: Payer: Self-pay | Admitting: Internal Medicine

## 2019-12-07 NOTE — Telephone Encounter (Signed)
Pt last saw Dr Caryl Comes 08/07/19, last labs 11/06/19 Creat 0.8 at [facility] per care everywhere, age 86, weight 59.6kg at [facility] on 11/13/19 at PCP, based on specified criteria pt is on appropriate dosage of Eliquis 2.5mg  BID.

## 2020-01-17 ENCOUNTER — Ambulatory Visit (INDEPENDENT_AMBULATORY_CARE_PROVIDER_SITE_OTHER): Payer: Medicare HMO

## 2020-01-17 DIAGNOSIS — I495 Sick sinus syndrome: Secondary | ICD-10-CM

## 2020-01-18 LAB — CUP PACEART REMOTE DEVICE CHECK
Battery Impedance: 1374 Ohm
Battery Remaining Longevity: 49 mo
Battery Voltage: 2.77 V
Brady Statistic AP VP Percent: 17 %
Brady Statistic AP VS Percent: 45 %
Brady Statistic AS VP Percent: 2 %
Brady Statistic AS VS Percent: 36 %
Date Time Interrogation Session: 20211230144919
Implantable Lead Implant Date: 20051130
Implantable Lead Implant Date: 20051130
Implantable Lead Location: 753859
Implantable Lead Location: 753860
Implantable Lead Model: 5076
Implantable Pulse Generator Implant Date: 20140901
Lead Channel Impedance Value: 677 Ohm
Lead Channel Impedance Value: 698 Ohm
Lead Channel Pacing Threshold Amplitude: 0.625 V
Lead Channel Pacing Threshold Amplitude: 1.125 V
Lead Channel Pacing Threshold Pulse Width: 0.4 ms
Lead Channel Pacing Threshold Pulse Width: 0.4 ms
Lead Channel Setting Pacing Amplitude: 2 V
Lead Channel Setting Pacing Amplitude: 2.5 V
Lead Channel Setting Pacing Pulse Width: 0.46 ms
Lead Channel Setting Sensing Sensitivity: 5.6 mV

## 2020-01-21 ENCOUNTER — Telehealth: Payer: Self-pay

## 2020-01-21 ENCOUNTER — Other Ambulatory Visit: Payer: Self-pay | Admitting: Internal Medicine

## 2020-01-21 NOTE — Telephone Encounter (Signed)
Scheduled remote transmission received- Long episode of AF occurred 12/11 lasting for >96 hours.  Sever HVR episodes occurred during this time as well.   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    Pt reports she has been under increased stress following the passing of her husband back in September.   Pt confirms compliance with meds as ordered including Eliquis  And Nadolol.    Advised pt anticipate continued monitoring.  She requested Dr. Graciela Husbands be informed of her husbands death as MD was familiar with her husband.

## 2020-01-21 NOTE — Telephone Encounter (Signed)
Thanks & noted

## 2020-01-25 DIAGNOSIS — Z1152 Encounter for screening for COVID-19: Secondary | ICD-10-CM | POA: Diagnosis not present

## 2020-01-25 DIAGNOSIS — Z03818 Encounter for observation for suspected exposure to other biological agents ruled out: Secondary | ICD-10-CM | POA: Diagnosis not present

## 2020-01-31 NOTE — Progress Notes (Signed)
Remote pacemaker transmission.   

## 2020-03-10 ENCOUNTER — Telehealth: Payer: Self-pay | Admitting: Internal Medicine

## 2020-03-10 NOTE — Telephone Encounter (Signed)
Patient only seeing dr. Caryl Comes now.  Deleting recall per patient request.

## 2020-04-09 ENCOUNTER — Ambulatory Visit: Payer: Medicare HMO | Admitting: Dermatology

## 2020-04-17 ENCOUNTER — Ambulatory Visit (INDEPENDENT_AMBULATORY_CARE_PROVIDER_SITE_OTHER): Payer: Medicare HMO

## 2020-04-17 DIAGNOSIS — I442 Atrioventricular block, complete: Secondary | ICD-10-CM | POA: Diagnosis not present

## 2020-04-18 LAB — CUP PACEART REMOTE DEVICE CHECK
Battery Impedance: 1401 Ohm
Battery Remaining Longevity: 48 mo
Battery Voltage: 2.77 V
Brady Statistic AP VP Percent: 17 %
Brady Statistic AP VS Percent: 44 %
Brady Statistic AS VP Percent: 2 %
Brady Statistic AS VS Percent: 37 %
Date Time Interrogation Session: 20220401110606
Implantable Lead Implant Date: 20051130
Implantable Lead Implant Date: 20051130
Implantable Lead Location: 753859
Implantable Lead Location: 753860
Implantable Lead Model: 5076
Implantable Pulse Generator Implant Date: 20140901
Lead Channel Impedance Value: 592 Ohm
Lead Channel Impedance Value: 621 Ohm
Lead Channel Pacing Threshold Amplitude: 0.75 V
Lead Channel Pacing Threshold Amplitude: 1 V
Lead Channel Pacing Threshold Pulse Width: 0.4 ms
Lead Channel Pacing Threshold Pulse Width: 0.4 ms
Lead Channel Setting Pacing Amplitude: 2 V
Lead Channel Setting Pacing Amplitude: 2.5 V
Lead Channel Setting Pacing Pulse Width: 0.46 ms
Lead Channel Setting Sensing Sensitivity: 5.6 mV

## 2020-04-30 NOTE — Progress Notes (Signed)
Remote pacemaker transmission.   

## 2020-05-01 DIAGNOSIS — R0982 Postnasal drip: Secondary | ICD-10-CM | POA: Diagnosis not present

## 2020-05-01 DIAGNOSIS — J320 Chronic maxillary sinusitis: Secondary | ICD-10-CM | POA: Diagnosis not present

## 2020-05-07 DIAGNOSIS — E7800 Pure hypercholesterolemia, unspecified: Secondary | ICD-10-CM | POA: Diagnosis not present

## 2020-05-07 DIAGNOSIS — E039 Hypothyroidism, unspecified: Secondary | ICD-10-CM | POA: Diagnosis not present

## 2020-05-07 DIAGNOSIS — I10 Essential (primary) hypertension: Secondary | ICD-10-CM | POA: Diagnosis not present

## 2020-05-14 DIAGNOSIS — E039 Hypothyroidism, unspecified: Secondary | ICD-10-CM | POA: Diagnosis not present

## 2020-05-14 DIAGNOSIS — I5022 Chronic systolic (congestive) heart failure: Secondary | ICD-10-CM | POA: Diagnosis not present

## 2020-05-14 DIAGNOSIS — I517 Cardiomegaly: Secondary | ICD-10-CM | POA: Diagnosis not present

## 2020-05-14 DIAGNOSIS — I110 Hypertensive heart disease with heart failure: Secondary | ICD-10-CM | POA: Diagnosis not present

## 2020-05-14 DIAGNOSIS — R079 Chest pain, unspecified: Secondary | ICD-10-CM | POA: Diagnosis not present

## 2020-05-14 DIAGNOSIS — I779 Disorder of arteries and arterioles, unspecified: Secondary | ICD-10-CM | POA: Diagnosis not present

## 2020-05-14 DIAGNOSIS — I480 Paroxysmal atrial fibrillation: Secondary | ICD-10-CM | POA: Diagnosis not present

## 2020-05-17 ENCOUNTER — Other Ambulatory Visit: Payer: Self-pay

## 2020-05-17 ENCOUNTER — Emergency Department: Payer: Medicare HMO

## 2020-05-17 ENCOUNTER — Encounter: Payer: Self-pay | Admitting: Intensive Care

## 2020-05-17 ENCOUNTER — Telehealth: Payer: Self-pay | Admitting: Medical

## 2020-05-17 ENCOUNTER — Observation Stay
Admission: EM | Admit: 2020-05-17 | Discharge: 2020-05-19 | Disposition: A | Discharge status: Home / Self Care | Payer: Medicare HMO | Attending: Family Medicine | Admitting: Family Medicine

## 2020-05-17 DIAGNOSIS — I10 Essential (primary) hypertension: Secondary | ICD-10-CM | POA: Diagnosis not present

## 2020-05-17 DIAGNOSIS — R8271 Bacteriuria: Secondary | ICD-10-CM | POA: Insufficient documentation

## 2020-05-17 DIAGNOSIS — R002 Palpitations: Secondary | ICD-10-CM | POA: Diagnosis not present

## 2020-05-17 DIAGNOSIS — Z96659 Presence of unspecified artificial knee joint: Secondary | ICD-10-CM | POA: Diagnosis not present

## 2020-05-17 DIAGNOSIS — I4891 Unspecified atrial fibrillation: Secondary | ICD-10-CM | POA: Diagnosis not present

## 2020-05-17 DIAGNOSIS — Z7901 Long term (current) use of anticoagulants: Secondary | ICD-10-CM | POA: Diagnosis not present

## 2020-05-17 DIAGNOSIS — Z950 Presence of cardiac pacemaker: Secondary | ICD-10-CM | POA: Diagnosis not present

## 2020-05-17 DIAGNOSIS — R531 Weakness: Secondary | ICD-10-CM

## 2020-05-17 DIAGNOSIS — Z20822 Contact with and (suspected) exposure to covid-19: Secondary | ICD-10-CM | POA: Insufficient documentation

## 2020-05-17 DIAGNOSIS — Z91040 Latex allergy status: Secondary | ICD-10-CM | POA: Diagnosis not present

## 2020-05-17 DIAGNOSIS — I5032 Chronic diastolic (congestive) heart failure: Secondary | ICD-10-CM

## 2020-05-17 DIAGNOSIS — N3090 Cystitis, unspecified without hematuria: Secondary | ICD-10-CM

## 2020-05-17 DIAGNOSIS — I110 Hypertensive heart disease with heart failure: Secondary | ICD-10-CM | POA: Insufficient documentation

## 2020-05-17 DIAGNOSIS — Z79899 Other long term (current) drug therapy: Secondary | ICD-10-CM | POA: Diagnosis not present

## 2020-05-17 DIAGNOSIS — R0989 Other specified symptoms and signs involving the circulatory and respiratory systems: Secondary | ICD-10-CM | POA: Diagnosis not present

## 2020-05-17 DIAGNOSIS — R55 Syncope and collapse: Secondary | ICD-10-CM | POA: Diagnosis not present

## 2020-05-17 DIAGNOSIS — R000 Tachycardia, unspecified: Secondary | ICD-10-CM | POA: Diagnosis not present

## 2020-05-17 DIAGNOSIS — Z8544 Personal history of malignant neoplasm of other female genital organs: Secondary | ICD-10-CM | POA: Diagnosis not present

## 2020-05-17 LAB — HEPATIC FUNCTION PANEL
ALT: 14 U/L (ref 0–44)
AST: 20 U/L (ref 15–41)
Albumin: 3.6 g/dL (ref 3.5–5.0)
Alkaline Phosphatase: 55 U/L (ref 38–126)
Bilirubin, Direct: 0.1 mg/dL (ref 0.0–0.2)
Indirect Bilirubin: 0.7 mg/dL (ref 0.3–0.9)
Total Bilirubin: 0.8 mg/dL (ref 0.3–1.2)
Total Protein: 7 g/dL (ref 6.5–8.1)

## 2020-05-17 LAB — RESP PANEL BY RT-PCR (FLU A&B, COVID) ARPGX2
Influenza A by PCR: NEGATIVE
Influenza B by PCR: NEGATIVE
SARS Coronavirus 2 by RT PCR: NEGATIVE

## 2020-05-17 LAB — CBC
HCT: 36.2 % (ref 36.0–46.0)
Hemoglobin: 11.6 g/dL — ABNORMAL LOW (ref 12.0–15.0)
MCH: 30.4 pg (ref 26.0–34.0)
MCHC: 32 g/dL (ref 30.0–36.0)
MCV: 95 fL (ref 80.0–100.0)
Platelets: 203 10*3/uL (ref 150–400)
RBC: 3.81 MIL/uL — ABNORMAL LOW (ref 3.87–5.11)
RDW: 13.9 % (ref 11.5–15.5)
WBC: 7.3 10*3/uL (ref 4.0–10.5)
nRBC: 0 % (ref 0.0–0.2)

## 2020-05-17 LAB — BASIC METABOLIC PANEL
Anion gap: 9 (ref 5–15)
BUN: 23 mg/dL (ref 8–23)
CO2: 25 mmol/L (ref 22–32)
Calcium: 9 mg/dL (ref 8.9–10.3)
Chloride: 102 mmol/L (ref 98–111)
Creatinine, Ser: 0.92 mg/dL (ref 0.44–1.00)
GFR, Estimated: >60 mL/min (ref >60)
Glucose, Bld: 92 mg/dL (ref 70–99)
Potassium: 4 mmol/L (ref 3.5–5.1)
Sodium: 136 mmol/L (ref 135–145)

## 2020-05-17 LAB — URINALYSIS, COMPLETE (UACMP) WITH MICROSCOPIC
Bilirubin Urine: NEGATIVE
Glucose, UA: NEGATIVE mg/dL
Ketones, ur: NEGATIVE mg/dL
Leukocytes,Ua: NEGATIVE
Nitrite: NEGATIVE
Protein, ur: NEGATIVE mg/dL
Specific Gravity, Urine: 1.006 (ref 1.005–1.030)
pH: 5 (ref 5.0–8.0)

## 2020-05-17 LAB — TROPONIN I (HIGH SENSITIVITY)
Troponin I (High Sensitivity): 5 ng/L (ref <18)
Troponin I (High Sensitivity): 6 ng/L (ref <18)

## 2020-05-17 LAB — PROTIME-INR
INR: 1.2 (ref 0.8–1.2)
Prothrombin Time: 15.4 seconds — ABNORMAL HIGH (ref 11.4–15.2)

## 2020-05-17 LAB — BRAIN NATRIURETIC PEPTIDE: B Natriuretic Peptide: 371 pg/mL — ABNORMAL HIGH (ref 0.0–100.0)

## 2020-05-17 LAB — TSH: TSH: 2.42 u[IU]/mL (ref 0.350–4.500)

## 2020-05-17 LAB — T4, FREE: Free T4: 0.86 ng/dL (ref 0.61–1.12)

## 2020-05-17 LAB — MAGNESIUM: Magnesium: 2.2 mg/dL (ref 1.7–2.4)

## 2020-05-17 MED ORDER — NADOLOL 40 MG PO TABS
60.0000 mg | ORAL_TABLET | Freq: Two times a day (BID) | ORAL | Status: DC
  Administered 2020-05-18 – 2020-05-19 (×3): 60 mg via ORAL
  Filled 2020-05-17 (×4): qty 1

## 2020-05-17 MED ORDER — ACETAMINOPHEN 650 MG RE SUPP: 650.0000 mg | Freq: Four times a day (QID) | RECTAL | Status: DC | PRN

## 2020-05-17 MED ORDER — DILTIAZEM HCL-DEXTROSE 125-5 MG/125ML-% IV SOLN (PREMIX)
5.0000 mg/h | INTRAVENOUS | Status: DC
  Administered 2020-05-17 (×2): 5 mg/h via INTRAVENOUS
  Filled 2020-05-17: qty 125

## 2020-05-17 MED ORDER — ACETAMINOPHEN 500 MG PO TABS
1000.0000 mg | ORAL_TABLET | Freq: Once | ORAL | Status: AC
  Administered 2020-05-17: 1000 mg via ORAL
  Filled 2020-05-17: qty 2

## 2020-05-17 MED ORDER — NADOLOL 40 MG PO TABS
60.0000 mg | ORAL_TABLET | Freq: Once | ORAL | Status: AC
  Administered 2020-05-17: 60 mg via ORAL
  Filled 2020-05-17: qty 1

## 2020-05-17 MED ORDER — APIXABAN 2.5 MG PO TABS
2.5000 mg | ORAL_TABLET | Freq: Two times a day (BID) | ORAL | Status: DC
  Administered 2020-05-17 – 2020-05-19 (×4): 2.5 mg via ORAL
  Filled 2020-05-17 (×5): qty 1

## 2020-05-17 MED ORDER — METOPROLOL TARTRATE 5 MG/5ML IV SOLN
5.0000 mg | Freq: Once | INTRAVENOUS | Status: AC
  Administered 2020-05-17: 5 mg via INTRAVENOUS
  Filled 2020-05-17: qty 5

## 2020-05-17 MED ORDER — LORAZEPAM 2 MG/ML IJ SOLN: 0.5000 mg | Freq: Four times a day (QID) | INTRAMUSCULAR | Status: DC | PRN

## 2020-05-17 MED ORDER — ACETAMINOPHEN 325 MG PO TABS: 650.0000 mg | ORAL_TABLET | Freq: Four times a day (QID) | ORAL | Status: DC | PRN

## 2020-05-17 MED ORDER — SODIUM CHLORIDE 0.9 % IV SOLN
1.0000 g | Freq: Once | INTRAVENOUS | Status: AC
  Administered 2020-05-17: 1 g via INTRAVENOUS
  Filled 2020-05-17: qty 10

## 2020-05-17 NOTE — ED Notes (Signed)
Pt ambulated to bedside toilet with steady gait, assisted back to bed.

## 2020-05-17 NOTE — ED Provider Notes (Signed)
-----------------------------------------   3:03 PM on 05/17/2020 -----------------------------------------  Blood pressure (!) 144/78, pulse (!) 105, temperature 97.6 F (36.4 C), temperature source Oral, resp. rate 18, height 5' 6.5" (1.689 m), weight 57.2 kg, SpO2 97 %.  Assuming care from Dr. Jari Pigg.  In short, Alejandra Wiley is a 87 y.o. female with a chief complaint of Palpitations .  Refer to the original H&P for additional details.  The current plan of care is to follow-up rate control of atrial fibrillation following dose of metoprolol.  ----------------------------------------- 5:31 PM on 05/17/2020 -----------------------------------------  Heart rate remains highly variable despite dose of IV metoprolol, patient will go from the 70s and 80s to the 130s with atrial fibrillation.  Case discussed with Dr. Garen Lah of cardiology, who recommends increasing patient's nadolol to 60 mg twice daily.  She was given initial dose of this but continues to be tachycardic, we will give additional IV metoprolol.  Patient is also quite symptomatic with her atrial fibrillation, feels much weaker than usual.  Plan to discuss with hospitalist for admission.    Blake Divine, MD 05/17/20 [phone]

## 2020-05-17 NOTE — ED Notes (Signed)
Patient's pacemaker interrogated and information sent to [place].

## 2020-05-17 NOTE — ED Notes (Signed)
Pt ambulatory to bedside commode with steady gait and RN present in room. Assisted back to bed, continuous cardiac and pulse ox monitoring resumed. Bed in low position with wheels locked, side rails up x2, call light in reach. Ordered pt a diet tray, plan to call [facility] to see if patient can go to medical side instead of being admitted to the hospital per her request and discussion with Dr Charna Archer.

## 2020-05-17 NOTE — ED Notes (Addendum)
Arrived by EMS from [facility] independent living. Hx a-fib and CHF. Palpitations started Wednesday. Baseline HR with EMS 80-90 with periods of 130s. Patient reports feeling near syncope. Takes eliquis and beta blocker

## 2020-05-17 NOTE — ED Notes (Signed)
Orthostatic vitals completed, see charted vitals. Pt complains of dizziness when standing for blood pressure reading, see heart rate charted. Patient assisted back to bed. Stretcher in low position with wheels locked, side rails up x2, call light in reach. Continuous cardiac and pulse ox monitoring in use.

## 2020-05-17 NOTE — ED Notes (Signed)
Discussed plan of care with patient including admission. Called [facility] to notify them of change in plan of care to include admission to the hospital and spoke with Weldon Picking who states he can notify the appropriate people at the facility. Patient is agreeable with this plan of care and denies questions.

## 2020-05-17 NOTE — H&P (Addendum)
History and Physical    PLEASE NOTE THAT DRAGON DICTATION SOFTWARE WAS USED IN THE CONSTRUCTION OF THIS NOTE.   JEANAE WHITMILL CWC:[id number] DOB: 27-Jan-1932 DOA: 05/17/2020  PCP: Kirk Ruths, MD Patient coming from: home   I have personally briefly reviewed patient's old medical records in [place]  Chief Complaint: Palpitations  HPI: Alejandra Wiley is a 87 y.o. female with medical history significant for paroxysmal atrial fibrillation complicated by sick sinus syndrome status post pacemaker placement and chronically anticoagulated on Eliquis, chronic diastolic heart failure, hypertension, who is admitted to [facility] on 05/17/2020 with atrial fibrillation with RVRafter presenting from home to [facility] ED complaining of palpitations.   The patient reports 2 days of palpitations associated with dizziness in the absence of vertigo, presyncope, syncope, or fall.  Denies any associated chest pain, shortness of breath, diaphoresis, nausea, vomiting.  Denies any recent orthopnea, PND, or new onset peripheral edema.  Not associate with any recent cough, wheezing, hemoptysis, new lower extremity erythema, or calf tenderness.  Denies any recent trauma, travel, surgical procedures, or periods of diminished ambulatory status.  No recent melena or hematochezia.  She also denies any recent subjective fever, chills, rigors, or generalized myalgias.  No recent headache, neck stiffness, rhinitis, rhinorrhea, sore throat, abdominal pain, diarrhea, or rash.  No recent known COVID-19 exposures.  Denies any recent dysuria, gross hematuria, or change in urinary urgency/frequency.  Over the last 2 days she also reports associated generalized weakness in the absence of any acute focal weakness.  She denies any associated acute focal paresthesias, numbness, dysarthria, facial droop, dysphagia, or acute change in vision.  The patient conveys that at baseline, she lives independently  in a house by herself, and does not require any ambulatory assistance.  However, over the last 2 days in the setting of progressive generalized weakness, she notes significant difficulty relative to baseline perform her ADLs.   The patient confirms a history of paroxysmal atrial fibrillation for which she underwent pacemaker placement in the setting of sick sinus syndrome.  She acknowledges that she is on chronic anticoagulation with Eliquis, and reports outstanding compliance with this blood thinner.  Additionally, she reports outstanding compliance with her home AV nodal blocking regimen, which is limited to nadolol 40 mg p.o. twice daily.  She notes no recent dose adjustments to this medication.   Medical history is also notable for a history of chronic diastolic heart failure, most recent echocardiogram performed in November 2020, which was notable for LVEF 55%, mild concentric LVH, grade 1 diastolic dysfunction, mildly dilated left atrium, and mild to moderate tricuspid regurgitation.  Patient reports that she takes her Lasix on a as needed as opposed to scheduled basis, with close monitoring of her daily weights to guide indication for use of Lasix.  She conveys that she has not needed to use any recent diuretic.     ED Course:  Vital signs in the ED were notable for the following: Temperature max 97.6; initial heart rate while in atrial fibrillation with RVR found to be in the 130s.  Subsequently, following additional dose of nadolol per the recommendations of cardiology, as further detailed below, patient's heart rate has improved slightly, into the 110s to 120s, but with intermittent spikes still into the 130s.  Blood pressure is tolerating these heart rates as well as the associated rate control interventions, with blood pressure in the ED noted to be 131/85 -160/75; respiratory rate 15 and 21; oxygen saturation  95 to 100% on room air.  Labs were notable for the following: CMP was notable for  the following: Sodium 136, potassium 4.0, BUN 23, creatinine 0.92 relative to most recent prior value 0.95 in January 2020.  Serum magnesium level 2.2.  BNP 371 with no prior data point available for point comparison.  High-sensitivity troponin I x2 evaluated 5 followed by value of 6.  TSH within normal limits at 2.420.  CBC notable for white blood cell count 7300, hemoglobin 11.6, relative to 11.8 in January 2020, with presenting hemoglobin associated with normocytic and normochromic findings as well as nonelevated RDW, platelets 203.  INR 1.2 urinalysis showed no evidence of white blood cells and was nitrate negative as well as leukocyte esterase negative.  Screening nasopharyngeal COVID-19/influenza PCR were performed in the ED today, and found to be negative.  EKG showed atrial fibrillation with left bundle branch block, ventricular rate 87, QTc 488 MS, nonspecific T wave inversion in aVL, no evidence of ST changes, including no evidence of ST elevation.  Chest x-ray showed no evidence of acute cardiopulmonary process, including no evidence of infiltrate, edema, effusion, or pneumothorax.  The patient's case was discussed with the on-call [facility] cardiologist, Dr. Garen Lah, who recommended increasing home dose of nadolol from 40 mg p.o. twice daily to 60 mg p.o. twice daily.   While in the ED, the following were administered: Nadolol 60 mg p.o. x1, Lopressor 5 mg IV x2, Rocephin 1 g IV x1, and initiation of diltiazem drip.  Subsequently, the patient was admitted to the PCU for further evaluation and management of presenting atrial fibrillation with RVR, including for rate control measures.     Review of Systems: As per HPI otherwise 10 point review of systems negative.   Past Medical History:  Diagnosis Date  . Bradycardia   . 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 CHF (congestive heart failure) ([place])   . Hypertension   . Pacemaker 12/06/2003   Medtronic Impulse DTDR01  .  Pacemaker 2015  . PAF (paroxysmal atrial fibrillation) (HCC)    a. on Coumadin and flecainide; b. CHADS2VASc 3 (HTN, age x 2, female)  . Sick sinus syndrome ([place])   . Sinoatrial node dysfunction (HCC)   . Stroke ([place])   . Vaginal intraepithelial neoplasia     Past Surgical History:  Procedure Laterality Date  . APPENDECTOMY    . HERNIA REPAIR     lower abd  . IMAGE GUIDED SINUS SURGERY  2015  . INSERT / REPLACE / REMOVE PACEMAKER  12/06/2003   Medtronic impulse DTDR01  . JOINT REPLACEMENT Right    knee  . KYPHOPLASTY N/A 06/23/2017   Procedure: [id number];  Surgeon: Hessie Knows, MD;  Location: [facility] ORS;  Service: Orthopedics;  Laterality: N/A;  . LEEP  1999  . ORIF HIP FRACTURE Right 02/2016  . PACEMAKER GENERATOR CHANGE N/A 09/18/2012   Procedure: PACEMAKER GENERATOR CHANGE;  Surgeon: Deboraha Sprang, MD;  Location: [facility] CATH LAB;  Service: Cardiovascular;  Laterality: N/A;  . TONSILLECTOMY    . TOTAL KNEE ARTHROPLASTY    . TOTAL KNEE REVISION Right 09/23/2016   Procedure: TOTAL KNEE REVISION-POLYETHYLENE EXCHANGE;  Surgeon: Hessie Knows, MD;  Location: [facility] ORS;  Service: Orthopedics;  Laterality: Right;  Marland Kitchen VAGINAL HYSTERECTOMY      Social History:  reports that she has never smoked. She has never used smokeless tobacco. She reports current alcohol use of about 1.0 standard drink of alcohol per week. She reports  that she does not use drugs.   Allergies  Allergen Reactions  . Latex Other (See Comments)    blisters  . Other Other (See Comments)    Blisters/ paper tape is Market researcher paper tape is Market researcher paper tape is Ok  Camera operator is Ok  . Tape Other (See Comments)    Blisters/ paper tape is Market researcher paper tape is Ok  . Xarelto [Rivaroxaban] Other (See Comments)    Gums bleeding and too much bruising  . Nickel Rash and Other (See Comments)    Family History  Problem Relation Age of Onset  . Stroke Mother   . Heart disease Father   .  Diabetes Daughter   . Diabetes Son      Prior to Admission medications   Medication Sig Start Date End Date Taking? Authorizing Provider  acetaminophen (TYLENOL) 500 MG tablet Take 500 mg by mouth every 8 (eight) hours as needed for mild pain or headache.   Yes [provider]  ELIQUIS 2.5 MG TABS tablet TAKE 1 TABLET TWICE DAILY Patient taking differently: Take 2.5 mg by mouth 2 (two) times daily. 12/07/19  Yes Deboraha Sprang, MD  fluticasone [facility]) 50 MCG/ACT nasal spray Place 2 sprays into both nostrils daily as needed for allergies or rhinitis.    Yes [provider]  furosemide (LASIX) 40 MG tablet Take 1 tablet (40 mg total) by mouth daily. 12/01/17  Yes Sudini, Alveta Heimlich, MD  ibandronate (BONIVA) 150 MG tablet Take 150 mg by mouth every 30 (thirty) days. Take in the morning with a full glass of water, on an empty stomach, and do not take anything else by mouth or lie down for the next 30 min.   Yes [provider]  Multiple Vitamins-Minerals (SENIOR MULTIVITAMIN PLUS PO) Take by mouth.   Yes [provider]  potassium chloride SA (KLOR-CON) 20 MEQ tablet Take 10 mEq by mouth 2 (two) times daily. Take 1/2 tablet QAM and 1/2 tablet QPM   Yes [provider]  mirtazapine (REMERON) 15 MG tablet Take 1 tablet (15 mg) by mouth once daily at bedtime Patient not taking: Reported on 05/17/2020    [provider]  nadolol (CORGARD) 40 MG tablet TAKE 1 TABLET (40 MG) BY MOUTH TWICE DAILY Patient taking differently: Take 40 mg by mouth 2 (two) times daily. Take 1 tablet (40 mg) by mouth twice daily 01/21/20   Deboraha Sprang, MD     Objective    Physical Exam: Vitals:   05/17/20 1530 05/17/20 1600 05/17/20 1800 05/17/20 1830  BP: (!) 155/76 (!) 156/75 131/85 (!) 146/88  Pulse: 68 90 81 87  Resp: 17 15 17  (!) 22  Temp:      TempSrc:      SpO2: 97% 98% 95% 93%  Weight:      Height:        General: appears to be stated age; alert,  oriented Skin: warm, dry, no rash Head:  AT/[place] Mouth:  Oral mucosa membranes appear moist, normal dentition Neck: supple; trachea midline Heart:  Irregular, tachycardic; 2/6 systolic murmur noted Lungs: CTAB, did not appreciate any wheezes, rales, or rhonchi Abdomen: + BS; soft, ND, NT Vascular: 2+ pedal pulses b/l; 2+ radial pulses b/l Extremities: no peripheral edema, no muscle wasting Neuro: strength and sensation intact in upper and lower extremities b/l    Labs on Admission: I have personally reviewed following labs and imaging studies  CBC: Recent Labs  Lab 05/17/20 1146  WBC 7.3  HGB 11.6*  HCT 36.2  MCV 95.0  PLT 176   Basic Metabolic Panel: Recent Labs  Lab 05/17/20 1146  NA 136  K 4.0  CL 102  CO2 25  GLUCOSE 92  BUN 23  CREATININE 0.92  CALCIUM 9.0  MG 2.2   GFR: Estimated Creatinine Clearance: 40.4 mL/min (by C-G formula based on SCr of 0.92 mg/dL). Liver Function Tests: Recent Labs  Lab 05/17/20 1146  AST 20  ALT 14  ALKPHOS 55  BILITOT 0.8  PROT 7.0  ALBUMIN 3.6   No results for input(s): LIPASE, AMYLASE in the last 168 hours. No results for input(s): AMMONIA in the last 168 hours. Coagulation Profile: Recent Labs  Lab 05/17/20 1146  INR 1.2   Cardiac Enzymes: No results for input(s): CKTOTAL, CKMB, CKMBINDEX, TROPONINI in the last 168 hours. BNP (last 3 results) No results for input(s): PROBNP in the last 8760 hours. HbA1C: No results for input(s): HGBA1C in the last 72 hours. CBG: No results for input(s): GLUCAP in the last 168 hours. Lipid Profile: No results for input(s): CHOL, HDL, LDLCALC, TRIG, CHOLHDL, LDLDIRECT in the last 72 hours. Thyroid Function Tests: Recent Labs    05/17/20 1146  TSH 2.420  FREET4 0.86   Anemia Panel: No results for input(s): VITAMINB12, FOLATE, FERRITIN, TIBC, IRON, RETICCTPCT in the last 72 hours. Urine analysis:    Component Value Date/Time   COLORURINE STRAW (A) 05/17/2020 1247    APPEARANCEUR CLEAR (A) 05/17/2020 1247   APPEARANCEUR Clear 09/16/2012 2023   LABSPEC 1.006 05/17/2020 1247   LABSPEC 1.016 09/16/2012 2023   PHURINE 5.0 05/17/2020 1247   GLUCOSEU NEGATIVE 05/17/2020 1247   GLUCOSEU Negative 09/16/2012 2023   HGBUR MODERATE (A) 05/17/2020 1247   BILIRUBINUR NEGATIVE 05/17/2020 1247   BILIRUBINUR Negative 09/16/2012 2023   KETONESUR NEGATIVE 05/17/2020 1247   PROTEINUR NEGATIVE 05/17/2020 1247   NITRITE NEGATIVE 05/17/2020 1247   LEUKOCYTESUR NEGATIVE 05/17/2020 1247   LEUKOCYTESUR 1+ 09/16/2012 2023    Radiological Exams on Admission: DG Chest 2 View  Result Date: 05/17/2020 CLINICAL DATA:  Palpitations. EXAM: CHEST - 2 VIEW COMPARISON:  November 29, 2017 FINDINGS: Stable pacemaker. The heart size is borderline to mildly enlarged. The hila and mediastinum are normal. No pneumothorax. No nodules or masses. No focal infiltrates. Previous vertebroplasty of a midthoracic vertebral body. Hyperinflation of the lungs suggesting COPD or emphysema. IMPRESSION: 1. No acute abnormalities are identified. Hyperinflation of the lungs suggesting COPD or emphysema. Electronically Signed   By: Dorise Bullion III M.D   On: 05/17/2020 12:08     EKG: Independently reviewed, with result as described above.    Assessment/Plan   PYPER OLEXA is a 87 y.o. female with medical history significant for paroxysmal atrial fibrillation complicated by sick sinus syndrome status post pacemaker placement and chronically anticoagulated on Eliquis, chronic diastolic heart failure, hypertension, who is admitted to [facility] on 05/17/2020 with atrial fibrillation with RVRafter presenting from home to [facility] ED complaining of palpitations.     Principal Problem:   Atrial fibrillation with RVR (HCC) Active Problems:   Chronic diastolic heart failure (HCC)   HTN (hypertension)   Generalized weakness     #) Atrial fibrillation with RVR: In the setting of a  known history of paroxysmal atrial fibrillation, the patient presents today in atrial fibrillation with ventricular rates in the 130's and associated with 2 days of palpitations, dizziness, generalized weakness impacting the  patient's ability to independently perform her ADLs, which she is able to do a baseline.  Blood pressure appears to be tolerating these rates, with no evidence of hypotension.  Most recent echocardiogram performed in November 2020, with results as described above.  In the setting of CHA2DS2-VASc score of 5, there is an indication for the patient to be on chronic anticoagulation for thromboembolic prophylaxis. Consistent with this, the patient is chronically anticoagulated on Eliquis, with reported outstanding compliance with. Home AV nodal blocking regimen: Nadolol 40 mg p.o. twice daily, along which the patient reports excellent compliance and no recent dose adjustments.  ACS is felt to be less likely in the absence of any chest pain as well as finding of nonelevated high-sensitivity troponin I values after 2 days of symptoms, as well as presenting EKG, which shows no evidence of acute ischemic changes.,  Etiology leading to patient's presentation of RVR is currently unclear.  No evidence of underlying physiologic stressor, including no evidence of underlying infectious process.  Initially, no clinical or radiographic evidence to suggest underlying acutely decompensated heart failure.  Furthermore, TSH found to be within normal limits.  In the absence of any overt source for patient's presenting RVR, and given the most recent echocardiogram was performed in November 2020, with evidence of mild to moderate valvular pathology at that time, we will repeat an echocardiogram in the morning following improvement in rate control to further evaluate potential structural contributions to presenting RVR.  Of note, the patient received a dose of Rocephin in the department today for many bacteria  observed on urinalysis.  However, in the setting of no white blood cells and no acute urinary symptoms, have elected for now to not continue any additional antibiotics, although this could certainly be reconsidered tomorrow if subsequently deemed to be a potential contributing factor leading to the patient's atrial fibrillation with RVR.  She is not septic at this time.   The patient's case was discussed with the on-call [facility] cardiologist, Dr. Garen Lah, who recommended increasing home dose of nadolol from 40 mg p.o. twice daily to 60 mg p.o. twice daily.  This adjustment was made, with dose of nadolol 60 mg p.o. x1 administered in the ED. there appears to be an ensuing gradual improvement in heart rate, although not yet to goal.  Consequently, the patient has been started on diltiazem drip for further optimization of rate control, and will be admitted to the PCU for further evaluation and management of presenting atrial fibrillation with RVR.     Plan: Per cardiology recommendations, will increase home alcohol from 40 mg p.o. twice daily to 60 mg p.o. BID, with next such dose to occur tomorrow morning  (05/18/20).  Diltiazem drip, as above.  Continue chronic anticoagulation on Eliquis.  Monitor strict I's and O's and daily weights. Monitor on telemetry. Check serum magnesium level with prn supplementation to maintain levels of greater than or equal to 2.0. Repeat BMP in the AM.  Repeat CBC in the AM.  Echocardiogram has been ordered for the morning, as described above.       #) Generalized weakness: the patient reports 2 days of progressive generalized weakness in the absence of any acute focal weakness or acute focal neurologic deficits.  Consequently, acute ischemic CVA is felt to be less likely at this time.  Suspect that patient's generalized weakness is on the basis of presenting atrial fibrillation with RVR given corresponding onset of palpitations indicative of the start of this episode of RVR 2  days ago.  Will strive for improvement in rate control, as further described above, and closely monitor for corresponding improvement in the patient's generalized weakness, while evaluating for any additional organic contributions to such.  No evidence of underlying infectious process at this time, including no evidence of UTI in the setting of no pyuria as well as no acute urinary symptoms.  Additionally, chest x-ray shows no evidence of acute cardiopulmonary process, while COVID-19/influenza PCR were found to be negative.  Of note, TSH was checked in the ED today and found to be within normal limits.   Plan: Work-up and management of presenting atrial fibrillation with RVR, including rate control measures, as above.  Check MMA.  Repeat BMP and CBC in the morning.  I also placed a physical therapy consult to occur in the morning.      #) Chronic diastolic heart failure: Documented history of such, with most recent echocardiogram performed in November 2020, with results as detailed above.  On as needed Lasix as her sole outpatient diuretic medication.  No clinical or radiographic evidence to suggest acutely decompensated heart failure at this time, although the patient is certainly at risk for development of acute volume overload in setting of presenting atrial fibrillation with RVR and its corresponding relative decline in cardiac output due to underlying loss of atrial kick but also diminished diastolic filling time in the setting of corresponding tachycardia.  Plan: Monitor strict I's and O's and daily weights.  Monitor on telemetry as well as continuous pulse oximetry.  Recurrent management presenting atrial fibrillation with RVR, as above.  Repeat BMP in the morning.  Add on serum magnesium level.      #) Essential hypertension: Patient's antihypertensive regimen at home consists of nadolol as well as as needed Lasix, as further described above.  Blood pressures have been tolerating presenting  atrial fibrillation with RVR including the ensuing rate control measures, without evidence of hypotension.  Per cardiology recommendations made today, will increase home nadolol from 40 mg po BID to 60 mg po BID. we will continue to hold as needed Lasix, as appears that the patient is relatively euvolemic, while closely monitoring ensuing trend and volume status.  Plan: Increase dose of Tylenol to 60 mg p.o. twice daily per instructions of cardiology, as above.  Monitor strict I's and O's and daily weights.  Close monitoring of ensuing blood pressure via routine vital signs.       DVT prophylaxis: Continue home Eliquis Code Status: The patient conveys her wish to be DNR/DNI Family Communication: none Disposition Plan: Per Rounding Team Consults called: Patient's case was discussed with the on-call [facility] cardiologist, Dr. Garen Lah, as further detailed above. Admission status: Inpatient; PCU.     Of note, this patient was added by me to the following Admit List/Treatment Team: armcadmits.      PLEASE NOTE THAT DRAGON DICTATION SOFTWARE WAS USED IN THE CONSTRUCTION OF THIS NOTE.   [facility] Pager [phone] From 12PM - 12AM  Otherwise, please contact night-coverage  [email or web address] Password [email or web address]   05/17/2020, 6:50 PM

## 2020-05-17 NOTE — Telephone Encounter (Signed)
   Patient called the after hours line to report lightheadedness. She states she has been in atrial fibrillation since Wednesday; she has known history of paroxysmal atrial fibrillation. Today she has been feeling particularly lightheaded and unsteady on her feet. She reports shaking but does not feel febrile, nor does she have a fever. She called the EMS service at her facility and reported HR was elevated to 170. Thankfully BP stable at 142/70. Repeat HR check on pulse oximeter was 87 bpm after some difficulty obtaining a reading - unclear accuracy - though she continues to have significant lightheadedness. She denies chest pain or SOB. She has taken her morning nadolol and reports compliance with her eliquis. Recommended ED evaluation for further management of her atrial fibrillation. Will notify [facility] team as FYI. Patient was in agreement with the plan and appreciative of the call.   Abigail Butts, PA-C 05/17/20; 10:15 AM

## 2020-05-17 NOTE — ED Notes (Signed)
Spoke with Buffy (staff at [facility]) who states their director of nursing will call me back with bed assignment for respite care for this patient at their facility. Will notify Dr Charna Archer.   Patient given dinner tray, denies further needs. Bed in low position with wheels locked, side rails up x2, call light in reach.

## 2020-05-17 NOTE — ED Provider Notes (Signed)
[facility] Emergency Department Provider Note  ____________________________________________   Event Date/Time   First MD Initiated Contact with Patient 05/17/20 1121     (approximate)  I have reviewed the triage vital signs and the nursing notes.   HISTORY  Chief Complaint Palpitations    HPI Alejandra Wiley is a 87 y.o. female with CHF, Parox afib, medtronic PM for bradycardia. Weds she felt like she was in Afib. It is intermittent, nothing makes it better or worse. Associated with dizziness, headache, shaky and thirsty and not feeling well. NO chest pain, no SOB.   HR between 70-170s with EMS. Pt on eliquis and BB.           Past Medical History:  Diagnosis Date  . Bradycardia   . 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 CHF (congestive heart failure) ([place])   . Hypertension   . Pacemaker 12/06/2003   Medtronic Impulse DTDR01  . Pacemaker 2015  . PAF (paroxysmal atrial fibrillation) (HCC)    a. on Coumadin and flecainide; b. CHADS2VASc 3 (HTN, age x 2, female)  . Sick sinus syndrome ([place])   . Sinoatrial node dysfunction (HCC)   . Stroke ([place])   . Vaginal intraepithelial neoplasia     Patient Active Problem List   Diagnosis Date Noted  . Chronic systolic (congestive) heart failure ([place]) 03/31/2018  . Arm weakness 02/09/2018  . UTI (urinary tract infection) 11/28/2017  . Protein calorie malnutrition ([place]) 10/10/2017  . Falls 10/10/2017  . Weakness 10/10/2017  . Muscle atrophy 10/10/2017  . Sinus node dysfunction ([place]) 10/10/2017  . Syncope and collapse 09/12/2017  . Pyelonephritis 08/26/2017  . Chronic diastolic heart failure ([place]) 07/19/2017  . HTN (hypertension) 07/19/2017  . Painful total knee replacement ([place]) 09/23/2016  . 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 ATRIAL FIBRILLATION 06/13/2008  . PACEMAKER-Medtronic 06/13/2008    Past Surgical History:  Procedure Laterality Date  . APPENDECTOMY    . HERNIA REPAIR      lower abd  . IMAGE GUIDED SINUS SURGERY  2015  . INSERT / REPLACE / REMOVE PACEMAKER  12/06/2003   Medtronic impulse DTDR01  . JOINT REPLACEMENT Right    knee  . KYPHOPLASTY N/A 06/23/2017   Procedure: VZCHYIFOYDX-A1;  Surgeon: Hessie Knows, MD;  Location: [facility] ORS;  Service: Orthopedics;  Laterality: N/A;  . LEEP  1999  . ORIF HIP FRACTURE Right 02/2016  . PACEMAKER GENERATOR CHANGE N/A 09/18/2012   Procedure: PACEMAKER GENERATOR CHANGE;  Surgeon: Deboraha Sprang, MD;  Location: [facility] CATH LAB;  Service: Cardiovascular;  Laterality: N/A;  . TONSILLECTOMY    . TOTAL KNEE ARTHROPLASTY    . TOTAL KNEE REVISION Right 09/23/2016   Procedure: TOTAL KNEE REVISION-POLYETHYLENE EXCHANGE;  Surgeon: Hessie Knows, MD;  Location: [facility] ORS;  Service: Orthopedics;  Laterality: Right;  [place] Kitchen VAGINAL HYSTERECTOMY      Prior to Admission medications   Medication Sig Start Date End Date Taking? Authorizing Provider  acetaminophen (TYLENOL) 500 MG tablet Take 500 mg by mouth every 8 (eight) hours as needed for mild pain or headache.    [provider]  ELIQUIS 2.5 MG TABS tablet TAKE 1 TABLET TWICE DAILY 12/07/19   Deboraha Sprang, MD  fluticasone [facility]) 50 MCG/ACT nasal spray Place 2 sprays into both nostrils daily as needed for allergies or rhinitis.     [provider]  furosemide (LASIX) 40 MG tablet Take 1 tablet (40 mg total) by mouth daily. 12/01/17  Hillary Bow, MD  ibandronate (BONIVA) 150 MG tablet Take 150 mg by mouth every 30 (thirty) days. Take in the morning with a full glass of water, on an empty stomach, and do not take anything else by mouth or lie down for the next 30 min.    [provider]  mirtazapine (REMERON) 15 MG tablet Take 1 tablet (15 mg) by mouth once daily at bedtime    [provider]  Multiple Vitamins-Minerals (SENIOR MULTIVITAMIN PLUS PO) Take by mouth.    [provider]  nadolol (CORGARD) 40 MG tablet TAKE 1 TABLET (40 MG) BY  MOUTH TWICE DAILY 01/21/20   Deboraha Sprang, MD  potassium chloride SA (KLOR-CON) 20 MEQ tablet Take 10 mEq by mouth 2 (two) times daily. Take 1/2 tablet QAM and 1/2 tablet QPM    [provider]    Allergies Latex, Other, Tape, Xarelto [rivaroxaban], and Nickel  Family History  Problem Relation Age of Onset  . Stroke Mother   . Heart disease Father   . Diabetes Daughter   . Diabetes Son     Social History Social History   Tobacco Use  . Smoking status: Never Smoker  . Smokeless tobacco: Never Used  Vaping Use  . Vaping Use: Never used  Substance Use Topics  . Alcohol use: Yes    Alcohol/week: 1.0 standard drink    Types: 1 Glasses of wine per week    Comment: occasionally  . Drug use: No      Review of Systems Constitutional: No fever/chills, feeling unwell Eyes: No visual changes. ENT: No sore throat. Cardiovascular: Denies chest pain.  Palpitations Respiratory: Denies shortness of breath. Gastrointestinal: No abdominal pain.  No nausea, no vomiting.  No diarrhea.  No constipation. Genitourinary: Negative for dysuria. Musculoskeletal: Negative for back pain. Skin: Negative for rash. Neurological: Negative for headaches, focal weakness or numbness.  Dizzy All other ROS negative ____________________________________________   PHYSICAL EXAM:  VITAL SIGNS: ED Triage Vitals  Enc Vitals Group     BP 05/17/20 1117 (!) 162/75     Pulse Rate 05/17/20 1117 87     Resp 05/17/20 1117 20     Temp 05/17/20 1117 97.6 F (36.4 C)     Temp Source 05/17/20 1117 Oral     SpO2 05/17/20 1117 100 %     Weight 05/17/20 1108 126 lb (57.2 kg)     Height 05/17/20 1108 5' 6.5" (1.689 m)     Head Circumference --      Peak Flow --      Pain Score 05/17/20 1108 0     Pain Loc --      Pain Edu? --      Excl. in [place]? --     Constitutional: Alert and oriented. Well appearing and in no acute distress. Eyes: Conjunctivae are normal. EOMI. Head: Atraumatic. Nose: No  congestion/rhinnorhea. Mouth/Throat: Mucous membranes are moist.   Neck: No stridor. Trachea Midline. FROM Cardiovascular: Irregular rate grossly normal heart sounds.  Good peripheral circulation. Respiratory: Normal respiratory effort.  No retractions. Lungs CTAB. Gastrointestinal: Soft and nontender. No distention. No abdominal bruits.  Musculoskeletal: No lower extremity tenderness nor edema.  No joint effusions. Neurologic:  Normal speech and language. No gross focal neurologic deficits are appreciated.  Skin:  Skin is warm, dry and intact. No rash noted. Psychiatric: Mood and affect are normal. Speech and behavior are normal. GU: Deferred   ____________________________________________   LABS (all labs ordered are listed,  but only abnormal results are displayed)  Labs Reviewed  CBC - Abnormal; Notable for the following components:      Result Value   RBC 3.81 (*)    Hemoglobin 11.6 (*)    All other components within normal limits  PROTIME-INR - Abnormal; Notable for the following components:   Prothrombin Time 15.4 (*)    All other components within normal limits  BRAIN NATRIURETIC PEPTIDE - Abnormal; Notable for the following components:   B Natriuretic Peptide 371.0 (*)    All other components within normal limits  URINALYSIS, COMPLETE (UACMP) WITH MICROSCOPIC - Abnormal; Notable for the following components:   Color, Urine STRAW (*)    APPearance CLEAR (*)    Hgb urine dipstick MODERATE (*)    Bacteria, UA MANY (*)    All other components within normal limits  RESP PANEL BY RT-PCR (FLU A&B, COVID) ARPGX2  BASIC METABOLIC PANEL  HEPATIC FUNCTION PANEL  MAGNESIUM  TSH  T4, FREE  TROPONIN I (HIGH SENSITIVITY)  TROPONIN I (HIGH SENSITIVITY)   ____________________________________________   ED ECG REPORT I, Vanessa [place], the attending physician, personally viewed and interpreted this ECG.  Atrial fibrillation rate of 87, no ST elevation, T wave version in aVL V5 and  V6 with left bundle branch block ____________________________________________  RADIOLOGY I, Vanessa [place], personally viewed and evaluated these images (plain radiographs) as part of my medical decision making, as well as reviewing the written report by the radiologist.  ED MD interpretation: No pneumonia  Official radiology report(s): DG Chest 2 View  Result Date: 05/17/2020 CLINICAL DATA:  Palpitations. EXAM: CHEST - 2 VIEW COMPARISON:  November 29, 2017 FINDINGS: Stable pacemaker. The heart size is borderline to mildly enlarged. The hila and mediastinum are normal. No pneumothorax. No nodules or masses. No focal infiltrates. Previous vertebroplasty of a midthoracic vertebral body. Hyperinflation of the lungs suggesting COPD or emphysema. IMPRESSION: 1. No acute abnormalities are identified. Hyperinflation of the lungs suggesting COPD or emphysema. Electronically Signed   By: Dorise Bullion III M.D   On: 05/17/2020 12:08    ____________________________________________   PROCEDURES  Procedure(s) performed (including Critical Care):  .1-3 Lead EKG Interpretation Performed by: Vanessa [place], MD Authorized by: Vanessa [place], MD     Interpretation: abnormal     ECG rate:  80-130s    ECG rate assessment: tachycardic     Rhythm: atrial fibrillation     Ectopy: none     Conduction: normal   Comments:     Patient occasionally will go into sinus rate and will be less than 100 but then goes into short runs of atrial fibrillation.  .Critical Care Performed by: Vanessa [place], MD Authorized by: Vanessa , MD   Critical care provider statement:    Critical care time (minutes):  45   Critical care was necessary to treat or prevent imminent or life-threatening deterioration of the following conditions:  Cardiac failure   Critical care was time spent personally by me on the following activities:  Discussions with consultants, evaluation of patient's response to treatment, examination of  patient, ordering and performing treatments and interventions, ordering and review of laboratory studies, ordering and review of radiographic studies, pulse oximetry, re-evaluation of patient's condition, obtaining history from patient or surrogate and review of old charts     ____________________________________________   INITIAL IMPRESSION / [place] / ED COURSE  Alejandra Wiley was evaluated in Emergency Department on 05/17/2020 for the  symptoms described in the history of present illness. She was evaluated in the context of the global COVID-19 pandemic, which necessitated consideration that the patient might be at risk for infection with the SARS-CoV-2 virus that causes COVID-19. Institutional protocols and algorithms that pertain to the evaluation of patients at risk for COVID-19 are in a state of rapid change based on information released by regulatory bodies including the CDC and federal and state organizations. These policies and algorithms were followed during the patient's care in the ED.    Patient is a 87 year old who was going in and out of A. fib with her sinus rhythm.  Will get labs to evaluate for Electra abnormalities, AKI, ACS, UTI, thyroid dysfunction.  Unlikely PE given on blood thinner.  Patient's been on her nadolol.  Patient denies any falls to suggest intracranial hemorrhage  Labs are reassuring.  Urine does show many bacteria.  Will send urine culture and give a dose of ceftriaxone.  On my repeat evaluation patient's heart rates are now stay more sustained into the 120s 130s.  We will give a dose of metoprolol 5 mg IV.  Patient handed off to oncoming team pending reassessment of heart rates for her A. fib with RVR.         ____________________________________________   FINAL CLINICAL IMPRESSION(S) / ED DIAGNOSES   Final diagnoses:  Atrial fibrillation with RVR (HCC)  Cystitis      MEDICATIONS GIVEN DURING THIS VISIT:  Medications   cefTRIAXone (ROCEPHIN) 1 g in sodium chloride 0.9 % 100 mL IVPB (has no administration in time range)  acetaminophen (TYLENOL) tablet 1,000 mg (1,000 mg Oral Given 05/17/20 1258)  metoprolol tartrate (LOPRESSOR) injection 5 mg (5 mg Intravenous Given 05/17/20 1454)     ED Discharge Orders    None       Note:  This document was prepared using Dragon voice recognition software and may include unintentional dictation errors.   Vanessa [place], MD 05/17/20 [phone]

## 2020-05-17 NOTE — ED Notes (Signed)
Dr. Damita Dunnings notified patient sustaining a heart rate between 80-90 bpms for over an hour prior to Cardizem drip initiation. Cardizem drip stopped at this time, does not meet parameters for this medication at this time. Will notify provider if pt sustains a heart rate greater than 100bpm.

## 2020-05-17 NOTE — ED Notes (Signed)
Spoke with Debby Bud, [place] at [facility]. Report given. Patient to return to [facility] for respite care. Will arrange EMS transport.   Prior to discharge, we will administer meds as ordered in [facility] and continue to evaluate heart rate/cardiac rhythm.

## 2020-05-18 DIAGNOSIS — R531 Weakness: Secondary | ICD-10-CM | POA: Diagnosis not present

## 2020-05-18 DIAGNOSIS — I10 Essential (primary) hypertension: Secondary | ICD-10-CM | POA: Diagnosis not present

## 2020-05-18 DIAGNOSIS — I4891 Unspecified atrial fibrillation: Secondary | ICD-10-CM | POA: Diagnosis not present

## 2020-05-18 DIAGNOSIS — I5032 Chronic diastolic (congestive) heart failure: Secondary | ICD-10-CM | POA: Diagnosis not present

## 2020-05-18 LAB — CBC
HCT: 34.6 % — ABNORMAL LOW (ref 36.0–46.0)
Hemoglobin: 11.3 g/dL — ABNORMAL LOW (ref 12.0–15.0)
MCH: 30.7 pg (ref 26.0–34.0)
MCHC: 32.7 g/dL (ref 30.0–36.0)
MCV: 94 fL (ref 80.0–100.0)
Platelets: 194 10*3/uL (ref 150–400)
RBC: 3.68 MIL/uL — ABNORMAL LOW (ref 3.87–5.11)
RDW: 13.7 % (ref 11.5–15.5)
WBC: 5.6 10*3/uL (ref 4.0–10.5)
nRBC: 0 % (ref 0.0–0.2)

## 2020-05-18 LAB — BASIC METABOLIC PANEL
Anion gap: 7 (ref 5–15)
BUN: 20 mg/dL (ref 8–23)
CO2: 25 mmol/L (ref 22–32)
Calcium: 8.8 mg/dL — ABNORMAL LOW (ref 8.9–10.3)
Chloride: 106 mmol/L (ref 98–111)
Creatinine, Ser: 0.87 mg/dL (ref 0.44–1.00)
GFR, Estimated: >60 mL/min (ref >60)
Glucose, Bld: 103 mg/dL — ABNORMAL HIGH (ref 70–99)
Potassium: 3.9 mmol/L (ref 3.5–5.1)
Sodium: 138 mmol/L (ref 135–145)

## 2020-05-18 LAB — MAGNESIUM: Magnesium: 2.2 mg/dL (ref 1.7–2.4)

## 2020-05-18 LAB — MRSA PCR SCREENING: MRSA by PCR: POSITIVE — AB

## 2020-05-18 MED ORDER — CHLORHEXIDINE GLUCONATE CLOTH 2 % EX PADS
6.0000 | MEDICATED_PAD | Freq: Every day | CUTANEOUS | Status: DC
  Administered 2020-05-19: 6 via TOPICAL

## 2020-05-18 MED ORDER — MUPIROCIN 2 % EX OINT
1.0000 "application " | TOPICAL_OINTMENT | Freq: Two times a day (BID) | CUTANEOUS | Status: DC
  Administered 2020-05-18 – 2020-05-19 (×3): 1 via NASAL
  Filled 2020-05-18: qty 22

## 2020-05-18 NOTE — Progress Notes (Signed)
PT Cancellation Note  Patient Details Name: Alejandra Wiley MRN: [id number] DOB: [birth year]   Cancelled Treatment:    Reason Eval/Treat Not Completed: PT screened, no needs identified, will sign off. PT orders received, chart reviewed. Pt received in room with NT, both reporting pt ambulated multiple ("more than 4") laps around the nurses station without issue. Pt elects to demonstrate ability to stand on single leg without LOB in room. No acute PT needs identified at this time, will sign off.  Pt requesting cardiac rehab after speaking with MD.    Lavone Nian, PT, DPT 05/18/20, 3:14 PM  Waunita Schooner 05/18/2020, 3:13 PM

## 2020-05-18 NOTE — Progress Notes (Signed)
PROGRESS NOTE  Alejandra Wiley  JAS:[id number] DOB: 12-12-1932 DOA: 05/17/2020 PCP: Kirk Ruths, MD   Brief Narrative: Alejandra Wiley is an 87 y.o. female with a history of PAF, SSS s/p PPM, chronic HFpEF, HTN who presented to the ED 4/30 from [place] by EMS for lightheadedness, palpitations, diffuse weakness worsening since 4/27 associated with very rapid atrial fibrillation into 170's per EMT.    Assessment & Plan: Principal Problem:   Atrial fibrillation with RVR (HCC) Active Problems:   Chronic diastolic heart failure (HCC)   HTN (hypertension)   Generalized weakness  PAF with RVR: Ventricular response improved. Unclear precipitant.  - Continue nadolol increased to 60mg  po BID - Wean diltiazem gtt > rate in 70's on 5mg /hr currently.  - Measure HR with PT - Update echo to investigate structural cause for attack of otherwise unexplained RVR. - Regulate K, Mg. TSH wnl.  - Continue dose-adjusted eliquis w/CHA2DS2-VASc score of 5.  Chronic HFpEF, HTN: Last echo Nov 2020: LVEF 55%, mild concentric LVH, G1DD, mild LAE, mild-mod TR.  - prn lasix on hold. Appears euvolemic. BNP elevation likely related to rapid AFib.  - I/O, daily weights.  Generalized weakness:  - PT evaluation  Asymptomatic bacteriuria:  - Urine culture pending. Given CTX x1. Without symptoms or fever, leukocytosis, will hold further abx at this time.  DVT prophylaxis: Eliquis Code Status: DNR Family Communication: None at bedside Disposition Plan:  Status is: Inpatient  Remains inpatient appropriate because:Hemodynamically unstable   Dispo: The patient is from: [name]              Anticipated d/c is to: [facility]              Patient currently is not medically stable to d/c.   Difficult to place patient No  Consultants:   [facility], Dr. Garen Lah  Procedures:   Echocardiogram 05/18/2020 pending  Antimicrobials:  Ceftriaxone x1 4/30  Subjective: Feels much  better than at admission. Still some palpitation, but not bothersome. No dizziness, chest pain, leg swelling currently or recently. No fever. Again denies recent missed or changed dosing of medications.  Objective: Vitals:   05/18/20 0014 05/18/20 0200 05/18/20 0400 05/18/20 0750  BP: 140/86 (!) 145/80 124/69 140/69  Pulse:    70  Resp: 20 20 20 15   Temp: 97.8 F (36.6 C) 98 F (36.7 C) 97.7 F (36.5 C) 97.6 F (36.4 C)  TempSrc: Oral Oral Oral   SpO2: 99% 98% 97% 98%  Weight: 56.6 kg     Height: 5\' 7"  (1.702 m)       Intake/Output Summary (Last 24 hours) at 05/18/2020 0908 Last data filed at 05/18/2020 0700 Gross per 24 hour  Intake 134.33 ml  Output --  Net 134.33 ml   Filed Weights   05/17/20 1108 05/18/20 0014  Weight: 57.2 kg 56.6 kg    Gen: Very pleasant elderly female in no distress. Thin but WDWN. Pulm: Non-labored breathing room air. Clear to auscultation bilaterally.  CV: Irreg irreg, rate squarely in 70's bpm. No murmur, rub, or gallop. No JVD, no pedal edema. GI: Abdomen soft, non-tender, non-distended, with normoactive bowel sounds. No organomegaly or masses felt. Ext: Warm, no deformities Skin: No rashes, lesions or ulcers on visualized skin Neuro: Alert and oriented. No focal neurological deficits. Psych: Judgement and insight appear normal. Mood & affect appropriate.   Data Reviewed: I have personally reviewed following labs and imaging studies  CBC: Recent  Labs  Lab 05/17/20 1146 05/18/20 0447  WBC 7.3 5.6  HGB 11.6* 11.3*  HCT 36.2 34.6*  MCV 95.0 94.0  PLT 203 854   Basic Metabolic Panel: Recent Labs  Lab 05/17/20 1146 05/18/20 0447  NA 136 138  K 4.0 3.9  CL 102 106  CO2 25 25  GLUCOSE 92 103*  BUN 23 20  CREATININE 0.92 0.87  CALCIUM 9.0 8.8*  MG 2.2 2.2   GFR: Estimated Creatinine Clearance: 42.2 mL/min (by C-G formula based on SCr of 0.87 mg/dL). Liver Function Tests: Recent Labs  Lab 05/17/20 1146  AST 20  ALT 14  ALKPHOS  55  BILITOT 0.8  PROT 7.0  ALBUMIN 3.6   No results for input(s): LIPASE, AMYLASE in the last 168 hours. No results for input(s): AMMONIA in the last 168 hours. Coagulation Profile: Recent Labs  Lab 05/17/20 1146  INR 1.2   Cardiac Enzymes: No results for input(s): CKTOTAL, CKMB, CKMBINDEX, TROPONINI in the last 168 hours. BNP (last 3 results) No results for input(s): PROBNP in the last 8760 hours. HbA1C: No results for input(s): HGBA1C in the last 72 hours. CBG: No results for input(s): GLUCAP in the last 168 hours. Lipid Profile: No results for input(s): CHOL, HDL, LDLCALC, TRIG, CHOLHDL, LDLDIRECT in the last 72 hours. Thyroid Function Tests: Recent Labs    05/17/20 1146  TSH 2.420  FREET4 0.86   Anemia Panel: No results for input(s): VITAMINB12, FOLATE, FERRITIN, TIBC, IRON, RETICCTPCT in the last 72 hours. Urine analysis:    Component Value Date/Time   COLORURINE STRAW (A) 05/17/2020 1247   APPEARANCEUR CLEAR (A) 05/17/2020 1247   APPEARANCEUR Clear 09/16/2012 2023   LABSPEC 1.006 05/17/2020 1247   LABSPEC 1.016 09/16/2012 2023   PHURINE 5.0 05/17/2020 1247   GLUCOSEU NEGATIVE 05/17/2020 1247   GLUCOSEU Negative 09/16/2012 2023   HGBUR MODERATE (A) 05/17/2020 1247   BILIRUBINUR NEGATIVE 05/17/2020 1247   BILIRUBINUR Negative 09/16/2012 2023   KETONESUR NEGATIVE 05/17/2020 1247   PROTEINUR NEGATIVE 05/17/2020 1247   NITRITE NEGATIVE 05/17/2020 1247   LEUKOCYTESUR NEGATIVE 05/17/2020 1247   LEUKOCYTESUR 1+ 09/16/2012 2023   Recent Results (from the past 240 hour(s))  Resp Panel by RT-PCR (Flu A&B, Covid) Nasopharyngeal Swab     Status: None   Collection Time: 05/17/20 12:47 PM   Specimen: Nasopharyngeal Swab; Nasopharyngeal(NP) swabs in vial transport medium  Result Value Ref Range Status   SARS Coronavirus 2 by RT PCR NEGATIVE NEGATIVE Final    Comment: (NOTE) SARS-CoV-2 target nucleic acids are NOT DETECTED.  The SARS-CoV-2 RNA is generally detectable  in upper respiratory specimens during the acute phase of infection. The lowest concentration of SARS-CoV-2 viral copies this assay can detect is 138 copies/mL. A negative result does not preclude SARS-Cov-2 infection and should not be used as the sole basis for treatment or other patient management decisions. A negative result may occur with  improper specimen collection/handling, submission of specimen other than nasopharyngeal swab, presence of viral mutation(s) within the areas targeted by this assay, and inadequate number of viral copies(<138 copies/mL). A negative result must be combined with clinical observations, patient history, and epidemiological information. The expected result is Negative.  Fact Sheet for Patients:  EntrepreneurPulse.com.au  Fact Sheet for Healthcare Providers:  IncredibleEmployment.be  This test is no t yet approved or cleared by the [place] FDA and  has been authorized for detection and/or diagnosis of SARS-CoV-2 by FDA under an Emergency Use Authorization (EUA). This EUA  will remain  in effect (meaning this test can be used) for the duration of the COVID-19 declaration under Section 564(b)(1) of the Act, 21 U.S.C.section 360bbb-3(b)(1), unless the authorization is terminated  or revoked sooner.       Influenza A by PCR NEGATIVE NEGATIVE Final   Influenza B by PCR NEGATIVE NEGATIVE Final    Comment: (NOTE) The Xpert Xpress SARS-CoV-2/FLU/RSV plus assay is intended as an aid in the diagnosis of influenza from Nasopharyngeal swab specimens and should not be used as a sole basis for treatment. Nasal washings and aspirates are unacceptable for Xpert Xpress SARS-CoV-2/FLU/RSV testing.  Fact Sheet for Patients: EntrepreneurPulse.com.au  Fact Sheet for Healthcare Providers: IncredibleEmployment.be  This test is not yet approved or cleared by the [place] FDA and has  been authorized for detection and/or diagnosis of SARS-CoV-2 by FDA under an Emergency Use Authorization (EUA). This EUA will remain in effect (meaning this test can be used) for the duration of the COVID-19 declaration under Section 564(b)(1) of the Act, 21 U.S.C. section 360bbb-3(b)(1), unless the authorization is terminated or revoked.  Performed at [facility], [address] [place], [place] [address]   MRSA PCR Screening     Status: Abnormal   Collection Time: 05/18/20  1:30 AM   Specimen: Nasopharyngeal  Result Value Ref Range Status   MRSA by PCR POSITIVE (A) NEGATIVE Final    Comment:        The GeneXpert MRSA Assay (FDA approved for NASAL specimens only), is one component of a comprehensive MRSA colonization surveillance program. It is not intended to diagnose MRSA infection nor to guide or monitor treatment for MRSA infections. RESULT CALLED TO, READ BACK BY AND VERIFIED WITH: MICHELLE ROGERS ON 05/18/20 AT 0356 QSD Performed at [facility], [address] [place], [place] [address]       Radiology Studies: DG Chest 2 View  Result Date: 05/17/2020 CLINICAL DATA:  Palpitations. EXAM: CHEST - 2 VIEW COMPARISON:  November 29, 2017 FINDINGS: Stable pacemaker. The heart size is borderline to mildly enlarged. The hila and mediastinum are normal. No pneumothorax. No nodules or masses. No focal infiltrates. Previous vertebroplasty of a midthoracic vertebral body. Hyperinflation of the lungs suggesting COPD or emphysema. IMPRESSION: 1. No acute abnormalities are identified. Hyperinflation of the lungs suggesting COPD or emphysema. Electronically Signed   By: Dorise Bullion III M.D   On: 05/17/2020 12:08    Scheduled Meds: . apixaban  2.5 mg Oral BID  . Chlorhexidine Gluconate Cloth  6 each Topical Q0600  . mupirocin ointment  1 application Nasal BID  . nadolol  60 mg Oral BID   Continuous Infusions:   LOS: 1 day   Time spent: 35 minutes.  Patrecia Pour, MD [facility] [email or web address] 05/18/2020, 9:08 AM

## 2020-05-18 NOTE — Plan of Care (Signed)
  Problem: Education: °Goal: Knowledge of General Education information will improve °Description: Including pain rating scale, medication(s)/side effects and non-pharmacologic comfort measures °Outcome: Progressing °  °Problem: Clinical Measurements: °Goal: Ability to maintain clinical measurements within normal limits will improve °Outcome: Progressing °  °Problem: Clinical Measurements: °Goal: Cardiovascular complication will be avoided °Outcome: Progressing °  °Problem: Activity: °Goal: Risk for activity intolerance will decrease °Outcome: Progressing °  °Problem: Pain Managment: °Goal: General experience of comfort will improve °Outcome: Progressing °  °Problem: Safety: °Goal: Ability to remain free from injury will improve °Outcome: Progressing °  °

## 2020-05-19 ENCOUNTER — Inpatient Hospital Stay (HOSPITAL_BASED_OUTPATIENT_CLINIC_OR_DEPARTMENT_OTHER)
Admit: 2020-05-19 | Discharge: 2020-05-19 | Disposition: A | Discharge status: Home / Self Care | Payer: Medicare HMO | Attending: Family Medicine | Admitting: Family Medicine

## 2020-05-19 DIAGNOSIS — I4891 Unspecified atrial fibrillation: Secondary | ICD-10-CM | POA: Diagnosis not present

## 2020-05-19 DIAGNOSIS — I10 Essential (primary) hypertension: Secondary | ICD-10-CM | POA: Diagnosis not present

## 2020-05-19 DIAGNOSIS — I5032 Chronic diastolic (congestive) heart failure: Secondary | ICD-10-CM | POA: Diagnosis not present

## 2020-05-19 DIAGNOSIS — R531 Weakness: Secondary | ICD-10-CM | POA: Diagnosis not present

## 2020-05-19 LAB — ECHOCARDIOGRAM COMPLETE
AR max vel: 2.27 cm2
AV Area VTI: 2.63 cm2
AV Area mean vel: 2.14 cm2
AV Mean grad: 1 mmHg
AV Peak grad: 2.5 mmHg
Ao pk vel: 0.8 m/s
Area-P 1/2: 4.65 cm2
Height: 67 in
MV VTI: 2.05 cm2
S' Lateral: 2.9 cm
Weight: 2000 oz

## 2020-05-19 LAB — BASIC METABOLIC PANEL
Anion gap: 7 (ref 5–15)
BUN: 28 mg/dL — ABNORMAL HIGH (ref 8–23)
CO2: 25 mmol/L (ref 22–32)
Calcium: 8.9 mg/dL (ref 8.9–10.3)
Chloride: 106 mmol/L (ref 98–111)
Creatinine, Ser: 0.77 mg/dL (ref 0.44–1.00)
GFR, Estimated: >60 mL/min (ref >60)
Glucose, Bld: 103 mg/dL — ABNORMAL HIGH (ref 70–99)
Potassium: 4.1 mmol/L (ref 3.5–5.1)
Sodium: 138 mmol/L (ref 135–145)

## 2020-05-19 LAB — MAGNESIUM: Magnesium: 2.2 mg/dL (ref 1.7–2.4)

## 2020-05-19 MED ORDER — NADOLOL 20 MG PO TABS: 60.0000 mg | ORAL_TABLET | Freq: Two times a day (BID) | ORAL | 1 refills | Status: DC

## 2020-05-19 NOTE — Progress Notes (Signed)
*  PRELIMINARY RESULTS* Echocardiogram 2D Echocardiogram has been performed.  Alejandra Wiley 05/19/2020, 9:53 AM

## 2020-05-19 NOTE — Care Management Obs Status (Signed)
[facility] NOTIFICATION   Patient Details  Name: Alejandra Wiley MRN: [id number] Date of Birth: June 24, 1932   Medicare Observation Status Notification Given:  Yes    Latonda Larrivee A Ladd Cen, LCSW 05/19/2020, 10:24 AM

## 2020-05-19 NOTE — Plan of Care (Signed)
  Problem: Health Behavior/Discharge Planning: Goal: Ability to manage health-related needs will improve Outcome: Progressing   Problem: Clinical Measurements: Goal: Ability to maintain clinical measurements within normal limits will improve Outcome: Progressing   Problem: Clinical Measurements: Goal: Cardiovascular complication will be avoided Outcome: Progressing   Problem: Safety: Goal: Ability to remain free from injury will improve Outcome: Progressing   

## 2020-05-19 NOTE — Discharge Summary (Signed)
Physician Discharge Summary  Alejandra Wiley [id number]:[id number] DOB: 12/24/1932 DOA: 05/17/2020  PCP: Kirk Ruths, MD  Admit date: 05/17/2020 Discharge date: 05/19/2020  Admitted From: Home Disposition: Home   Recommendations for Outpatient Follow-up:  1. Follow up with cardiology, Dr. Caryl Comes, in the next 1-2 weeks  Home Health: None new, recommend referral for cardiac rehab as outpatient. Equipment/Devices: None new Discharge Condition: Stable CODE STATUS: DNR Diet recommendation: Heart healthy  Brief/Interim Summary: Alejandra Wiley is an 87 y.o. female with a history of PAF, SSS s/p PPM, chronic HFpEF, HTN who presented to the ED 4/30 from [place] by EMS for lightheadedness, palpitations, diffuse weakness worsening since 4/27 associated with very rapid atrial fibrillation into 170's per EMT. Nadolol dose was increased and diltiazem infusion was required for rate control on admission. With these measures, rate control has significantly improved and remained stable. Echocardiogram was performed (results below) which showed preserved LVEF at 50% and grade 2 diastolic dysfunction. She is discharged in stable condition with plans to follow up with cardiology.   Discharge Diagnoses:  Principal Problem:   Atrial fibrillation with RVR (HCC) Active Problems:   Chronic diastolic heart failure (HCC)   HTN (hypertension)   Generalized weakness   Atrial fibrillation with rapid ventricular response (HCC)  PAF with RVR: Ventricular response improved. Unclear precipitant. TSH wnl.  - Continue nadolol increased to 60mg  po BID - Continue dose-adjusted eliquis w/CHA2DS2-VASc scoreof 5.  Chronic HFpEF, HTN: Last echo Nov 2020: LVEF 55%, mild concentric LVH, G1DD, mild LAE, mild-mod TR. Echocardiogram this admission relatively stable. - No changes to home medications other than nadolol as above. Appears euvolemic. BNP elevation likely related to rapid AFib.   Generalized weakness:  -  PT evaluation > recommending outpatient cardiac rehab.  Asymptomatic bacteriuria: Given Ceftriaxone at admission but stopped without symptoms or fever, leukocytosis, will hold further abx at this time.  IMPRESSIONS 1. Left ventricular ejection fraction, by estimation, is 50%. The left  ventricle has low normal function. The left ventricle has no regional wall  motion abnormalities. There is mild left ventricular hypertrophy. Left  ventricular diastolic parameters are  consistent with Grade II diastolic dysfunction (pseudonormalization).  2. Right ventricular systolic function is low normal. The right  ventricular size is normal.  3. Left atrial size was moderately dilated.  4. The mitral valve is normal in structure. Mild to moderate mitral valve  regurgitation.  5. The aortic valve is tricuspid. Aortic valve regurgitation is not  visualized.  6. The inferior vena cava is normal in size with greater than 50%  respiratory variability, suggesting right atrial pressure of 3 mmHg.   Discharge Instructions Discharge Instructions    Diet - low sodium heart healthy   Complete by: As directed    Discharge instructions   Complete by: As directed    You were evaluated with rapid atrial fibrillation which has improved with increase in nadolol. You no longer require infusion of medications and are stable for discharge. Your echocardiogram showed preserved left ventricle systolic (pumping) function and some diastolic (relaxing) dysfunction which is not significantly changed from prior echocardiogram. There are no regional abnormalities in the wall motion.  - You can follow up with Dr. Caryl Comes in the next 1-2 weeks to discuss further management.  - Until that time, take nadolol 60mg  (this will unfortunately be 3 pills) twice per day. You may alternatively take 1.5 of your current 40mg  tablets twice daily. Otherwise continue taking other medications as you were.  Increase activity slowly    Complete by: As directed      Allergies as of 05/19/2020      Reactions   Latex Other (See Comments)   blisters   Other Other (See Comments)   Blisters/ paper tape is Market researcher paper tape is Market researcher paper tape is Market researcher paper tape is Ok   Adult nurse (See Comments)   Blisters/ paper tape is Market researcher paper tape is Ok   Xarelto [rivaroxaban] Other (See Comments)   Gums bleeding and too much bruising   Nickel Rash, Other (See Comments)      Medication List    STOP taking these medications   mirtazapine 15 MG tablet Commonly known as: REMERON     TAKE these medications   acetaminophen 500 MG tablet Commonly known as: TYLENOL Take 500 mg by mouth every 8 (eight) hours as needed for mild pain or headache.   Eliquis 2.5 MG Tabs tablet Generic drug: apixaban TAKE 1 TABLET TWICE DAILY What changed: how much to take   fluticasone 50 MCG/ACT nasal spray Commonly known as: FLONASE Place 2 sprays into both nostrils daily as needed for allergies or rhinitis.   furosemide 40 MG tablet Commonly known as: Lasix Take 1 tablet (40 mg total) by mouth daily.   ibandronate 150 MG tablet Commonly known as: BONIVA Take 150 mg by mouth every 30 (thirty) days. Take in the morning with a full glass of water, on an empty stomach, and do not take anything else by mouth or lie down for the next 30 min.   nadolol 20 MG tablet Commonly known as: CORGARD Take 3 tablets (60 mg total) by mouth 2 (two) times daily. What changed:   medication strength  See the new instructions.   potassium chloride SA 20 MEQ tablet Commonly known as: KLOR-CON Take 10 mEq by mouth 2 (two) times daily. Take 1/2 tablet QAM and 1/2 tablet QPM   SENIOR MULTIVITAMIN PLUS PO Take by mouth.       Follow-up Information    Kirk Ruths, MD Follow up.   Specialty: Internal Medicine Contact information: [place]  [phone]        Deboraha Sprang, MD. Schedule an appointment as soon as possible for a visit in 1 week(s).   Specialty: Cardiology Contact information: [address] [place] [address] [phone]              Allergies  Allergen Reactions  . Latex Other (See Comments)    blisters  . Other Other (See Comments)    Blisters/ paper tape is Market researcher paper tape is Market researcher paper tape is Ok  Camera operator is Ok  . Tape Other (See Comments)    Blisters/ paper tape is Market researcher paper tape is Ok  . Xarelto [Rivaroxaban] Other (See Comments)    Gums bleeding and too much bruising  . Nickel Rash and Other (See Comments)    Consultations:  Cardiology by phone  Procedures/Studies: DG Chest 2 View  Result Date: 05/17/2020 CLINICAL DATA:  Palpitations. EXAM: CHEST - 2 VIEW COMPARISON:  November 29, 2017 FINDINGS: Stable pacemaker. The heart size is borderline to mildly enlarged. The hila and mediastinum are normal. No pneumothorax. No nodules or masses. No focal infiltrates. Previous vertebroplasty of a midthoracic vertebral body. Hyperinflation of the lungs suggesting COPD or emphysema. IMPRESSION: 1. No acute abnormalities are  identified. Hyperinflation of the lungs suggesting COPD or emphysema. Electronically Signed   By: Dorise Bullion III M.D   On: 05/17/2020 12:08   ECHOCARDIOGRAM COMPLETE  Result Date: 05/19/2020    ECHOCARDIOGRAM REPORT   Patient Name:   Alejandra Wiley Date of Exam: 05/19/2020 Medical Rec #:  [id number]       Height:       67.0 in Accession #:    [id number]      Weight:       125.0 lb Date of Birth:  [birth year]      BSA:          1.656 m Patient Age:    87 years        BP:           126/63 mmHg Patient Gender: F               HR:           74 bpm. Exam Location:  [facility] Procedure: 2D Echo, Color Doppler and Cardiac Doppler Indications:     I48.91 Atrial fibrillation  History:         Patient has prior  history of Echocardiogram examinations. CHF,                  Pacemaker, Stroke; Risk Factors:Hypertension. Hx of SSS.  Sonographer:     Charmayne Sheer RDCS (AE) Referring Phys:  Ty Ty Diagnosing Phys: Kate Sable MD  Sonographer Comments: Suboptimal apical window and suboptimal subcostal window. IMPRESSIONS  1. Left ventricular ejection fraction, by estimation, is 50%. The left ventricle has low normal function. The left ventricle has no regional wall motion abnormalities. There is mild left ventricular hypertrophy. Left ventricular diastolic parameters are  consistent with Grade II diastolic dysfunction (pseudonormalization).  2. Right ventricular systolic function is low normal. The right ventricular size is normal.  3. Left atrial size was moderately dilated.  4. The mitral valve is normal in structure. Mild to moderate mitral valve regurgitation.  5. The aortic valve is tricuspid. Aortic valve regurgitation is not visualized.  6. The inferior vena cava is normal in size with greater than 50% respiratory variability, suggesting right atrial pressure of 3 mmHg. FINDINGS  Left Ventricle: Left ventricular ejection fraction, by estimation, is 50%. The left ventricle has low normal function. The left ventricle has no regional wall motion abnormalities. The left ventricular internal cavity size was normal in size. There is mild left ventricular hypertrophy. Left ventricular diastolic parameters are consistent with Grade II diastolic dysfunction (pseudonormalization). Right Ventricle: The right ventricular size is normal. No increase in right ventricular wall thickness. Right ventricular systolic function is low normal. Left Atrium: Left atrial size was moderately dilated. Right Atrium: Right atrial size was normal in size. Pericardium: There is no evidence of pericardial effusion. Mitral Valve: The mitral valve is normal in structure. Mild to moderate mitral valve regurgitation. MV peak gradient, 4.5  mmHg. The mean mitral valve gradient is 2.0 mmHg. Tricuspid Valve: The tricuspid valve is normal in structure. Tricuspid valve regurgitation is mild. Aortic Valve: The aortic valve is tricuspid. Aortic valve regurgitation is not visualized. Aortic valve mean gradient measures 1.0 mmHg. Aortic valve peak gradient measures 2.5 mmHg. Aortic valve area, by VTI measures 2.63 cm. Pulmonic Valve: The pulmonic valve was not well visualized. Pulmonic valve regurgitation is not visualized. Aorta: The aortic root is normal in size and structure. Venous: The inferior vena cava is normal in size with  greater than 50% respiratory variability, suggesting right atrial pressure of 3 mmHg. IAS/Shunts: No atrial level shunt detected by color flow Doppler. Additional Comments: A device lead is visualized.  LEFT VENTRICLE PLAX 2D LVIDd:         3.60 cm  Diastology LVIDs:         2.90 cm  LV e' medial:    4.35 cm/s LV PW:         1.20 cm  LV E/e' medial:  18.5 LV IVS:        0.80 cm  LV e' lateral:   5.66 cm/s LVOT diam:     1.80 cm  LV E/e' lateral: 14.2 LV SV:         38 LV SV Index:   23 LVOT Area:     2.54 cm  LEFT ATRIUM             Index LA diam:        3.40 cm 2.05 cm/m LA Vol (A2C):   45.6 ml 27.53 ml/m LA Vol (A4C):   78.3 ml 47.28 ml/m LA Biplane Vol: 60.6 ml 36.59 ml/m  AORTIC VALVE                   PULMONIC VALVE AV Area (Vmax):    2.27 cm    PV Vmax:       0.54 m/s AV Area (Vmean):   2.14 cm    PV Vmean:      40.300 cm/s AV Area (VTI):     2.63 cm    PV VTI:        0.090 m AV Vmax:           79.70 cm/s  PV Peak grad:  1.2 mmHg AV Vmean:          57.000 cm/s PV Mean grad:  1.0 mmHg AV VTI:            0.144 m AV Peak Grad:      2.5 mmHg AV Mean Grad:      1.0 mmHg LVOT Vmax:         71.10 cm/s LVOT Vmean:        48.000 cm/s LVOT VTI:          0.149 m LVOT/AV VTI ratio: 1.03  AORTA Ao Root diam: 3.20 cm MITRAL VALVE               TRICUSPID VALVE MV Area (PHT): 4.65 cm    TR Peak grad:   28.1 mmHg MV Area VTI:   2.05  cm    TR Vmax:        265.00 cm/s MV Peak grad:  4.5 mmHg MV Mean grad:  2.0 mmHg    SHUNTS MV Vmax:       1.06 m/s    Systemic VTI:  0.15 m MV Vmean:      72.9 cm/s   Systemic Diam: 1.80 cm MV Decel Time: 163 msec MV E velocity: 80.30 cm/s Kate Sable MD Electronically signed by Kate Sable MD Signature Date/Time: 05/19/2020/1:27:47 PM    Final    Subjective: Feels well. No palpitations or lightheadedness while working with PT. No chest pain reported.  Discharge Exam: Vitals:   05/19/20 0735 05/19/20 1134  BP: 126/63 (!) 158/87  Pulse: 68 70  Resp: 19 19  Temp: (!) 97.5 F (36.4 C) 98 F (36.7 C)  SpO2: 98% 98%   General: No distress, pleasant older female WDWN  Cardiovascular: Irreg w/rate in 60-70's. No murmur, no JVD, no edema. Respiratory: Nonlabored and clear. Abdominal: Soft, NT, ND, bowel sounds +  Labs: BNP (last 3 results) Recent Labs    05/17/20 1146  BNP [id number]*   Basic Metabolic Panel: Recent Labs  Lab 05/17/20 1146 05/18/20 0447 05/19/20 0523  NA 136 138 138  K 4.0 3.9 4.1  CL 102 106 106  CO2 25 25 25   GLUCOSE 92 103* 103*  BUN 23 20 28*  CREATININE 0.92 0.87 0.77  CALCIUM 9.0 8.8* 8.9  MG 2.2 2.2 2.2   Liver Function Tests: Recent Labs  Lab 05/17/20 1146  AST 20  ALT 14  ALKPHOS 55  BILITOT 0.8  PROT 7.0  ALBUMIN 3.6   No results for input(s): LIPASE, AMYLASE in the last 168 hours. No results for input(s): AMMONIA in the last 168 hours. CBC: Recent Labs  Lab 05/17/20 1146 05/18/20 0447  WBC 7.3 5.6  HGB 11.6* 11.3*  HCT 36.2 34.6*  MCV 95.0 94.0  PLT 203 194   Cardiac Enzymes: No results for input(s): CKTOTAL, CKMB, CKMBINDEX, TROPONINI in the last 168 hours. BNP: Invalid input(s): POCBNP CBG: No results for input(s): GLUCAP in the last 168 hours. D-Dimer No results for input(s): DDIMER in the last 72 hours. Hgb A1c No results for input(s): HGBA1C in the last 72 hours. Lipid Profile No results for input(s): CHOL,  HDL, LDLCALC, TRIG, CHOLHDL, LDLDIRECT in the last 72 hours. Thyroid function studies Recent Labs    05/17/20 1146  TSH 2.420   Anemia work up No results for input(s): VITAMINB12, FOLATE, FERRITIN, TIBC, IRON, RETICCTPCT in the last 72 hours. Urinalysis    Component Value Date/Time   COLORURINE STRAW (A) 05/17/2020 1247   APPEARANCEUR CLEAR (A) 05/17/2020 1247   APPEARANCEUR Clear 09/16/2012 2023   LABSPEC 1.006 05/17/2020 1247   LABSPEC 1.016 09/16/2012 2023   PHURINE 5.0 05/17/2020 1247   GLUCOSEU NEGATIVE 05/17/2020 1247   GLUCOSEU Negative 09/16/2012 2023   HGBUR MODERATE (A) 05/17/2020 1247   BILIRUBINUR NEGATIVE 05/17/2020 1247   BILIRUBINUR Negative 09/16/2012 2023   KETONESUR NEGATIVE 05/17/2020 1247   PROTEINUR NEGATIVE 05/17/2020 1247   NITRITE NEGATIVE 05/17/2020 1247   LEUKOCYTESUR NEGATIVE 05/17/2020 1247   LEUKOCYTESUR 1+ 09/16/2012 2023    Microbiology Recent Results (from the past 240 hour(s))  Resp Panel by RT-PCR (Flu A&B, Covid) Nasopharyngeal Swab     Status: None   Collection Time: 05/17/20 12:47 PM   Specimen: Nasopharyngeal Swab; Nasopharyngeal(NP) swabs in vial transport medium  Result Value Ref Range Status   SARS Coronavirus 2 by RT PCR NEGATIVE NEGATIVE Final    Comment: (NOTE) SARS-CoV-2 target nucleic acids are NOT DETECTED.  The SARS-CoV-2 RNA is generally detectable in upper respiratory specimens during the acute phase of infection. The lowest concentration of SARS-CoV-2 viral copies this assay can detect is 138 copies/mL. A negative result does not preclude SARS-Cov-2 infection and should not be used as the sole basis for treatment or other patient management decisions. A negative result may occur with  improper specimen collection/handling, submission of specimen other than nasopharyngeal swab, presence of viral mutation(s) within the areas targeted by this assay, and inadequate number of viral copies(<138 copies/mL). A negative result  must be combined with clinical observations, patient history, and epidemiological information. The expected result is Negative.  Fact Sheet for Patients:  EntrepreneurPulse.com.au  Fact Sheet for Healthcare Providers:  IncredibleEmployment.be  This test is no t yet approved or cleared  by the [place] and  has been authorized for detection and/or diagnosis of SARS-CoV-2 by FDA under an Emergency Use Authorization (EUA). This EUA will remain  in effect (meaning this test can be used) for the duration of the COVID-19 declaration under Section 564(b)(1) of the Act, 21 U.S.C.section 360bbb-3(b)(1), unless the authorization is terminated  or revoked sooner.       Influenza A by PCR NEGATIVE NEGATIVE Final   Influenza B by PCR NEGATIVE NEGATIVE Final    Comment: (NOTE) The Xpert Xpress SARS-CoV-2/FLU/RSV plus assay is intended as an aid in the diagnosis of influenza from Nasopharyngeal swab specimens and should not be used as a sole basis for treatment. Nasal washings and aspirates are unacceptable for Xpert Xpress SARS-CoV-2/FLU/RSV testing.  Fact Sheet for Patients: EntrepreneurPulse.com.au  Fact Sheet for Healthcare Providers: IncredibleEmployment.be  This test is not yet approved or cleared by the [place] FDA and has been authorized for detection and/or diagnosis of SARS-CoV-2 by FDA under an Emergency Use Authorization (EUA). This EUA will remain in effect (meaning this test can be used) for the duration of the COVID-19 declaration under Section 564(b)(1) of the Act, 21 U.S.C. section 360bbb-3(b)(1), unless the authorization is terminated or revoked.  Performed at [facility], [address] [place], [place] [address]   Urine culture     Status: Abnormal (Preliminary result)   Collection Time: 05/17/20 12:47 PM   Specimen: Urine, Random  Result Value Ref Range Status    Specimen Description   Final    URINE, RANDOM Performed at [facility], [address], [place], [place] [address]    Special Requests   Final    NONE Performed at [facility], [address], [place], [place] [address]    Culture (A)  Final    >=100,000 COLONIES/mL KLEBSIELLA PNEUMONIAE SUSCEPTIBILITIES TO FOLLOW Performed at [facility], [place] [address], [place], [place] [address]    Report Status PENDING  Incomplete  MRSA PCR Screening     Status: Abnormal   Collection Time: 05/18/20  1:30 AM   Specimen: Nasopharyngeal  Result Value Ref Range Status   MRSA by PCR POSITIVE (A) NEGATIVE Final    Comment:        The GeneXpert MRSA Assay (FDA approved for NASAL specimens only), is one component of a comprehensive MRSA colonization surveillance program. It is not intended to diagnose MRSA infection nor to guide or monitor treatment for MRSA infections. RESULT CALLED TO, READ BACK BY AND VERIFIED WITH: MICHELLE ROGERS ON 05/18/20 AT 0356 QSD Performed at [facility], [address] [place], [place] [address]     Time coordinating discharge: Approximately 40 minutes  Patrecia Pour, MD  [facility] 05/19/2020, 1:48 PM

## 2020-05-19 NOTE — Care Management CC44 (Signed)
Condition Code 44 Documentation Completed  Patient Details  Name: Alejandra Wiley MRN: [id number] Date of Birth: 12-05-1932   Condition Code 44 given:  Yes Patient signature on Condition Code 44 notice:  Yes Documentation of 2 MD's agreement:  Yes Code 44 added to claim:  Yes    Gerrianne Scale [place], LCSW 05/19/2020, 10:24 AM

## 2020-05-19 NOTE — Discharge Instructions (Signed)
Seek medical attention right away if your palpitations return, you develop lightheadedness, dizziness, chest discomfort or trouble breathing.

## 2020-05-19 NOTE — NC FL2 (Signed)
[place] LEVEL OF CARE SCREENING TOOL     IDENTIFICATION  Patient Name: Alejandra Wiley Birthdate: March 13, 1932 Sex: female Admission Date (Current Location): 05/17/2020  [place] and [place] Number:  Engineering geologist and Address:  [facility], [address], [place], [place] [address]      Provider Number: [id number]  Attending Physician Name and Address:  Patrecia Pour, MD  Relative Name and Phone Number:       Current Level of Care: Hospital Recommended Level of Care: Memory Care Prior Approval Number:    Date Approved/Denied:   PASRR Number: [id number]  Discharge Plan: Other (Comment) (memory care)    Current Diagnoses: Patient Active Problem List   Diagnosis Date Noted  . Atrial fibrillation with rapid ventricular response ([place]) 05/19/2020  . Atrial fibrillation with RVR ([place]) 05/17/2020  . Generalized weakness 05/17/2020  . Chronic systolic (congestive) heart failure ([place]) 03/31/2018  . Arm weakness 02/09/2018  . UTI (urinary tract infection) 11/28/2017  . Protein calorie malnutrition ([place]) 10/10/2017  . Falls 10/10/2017  . Weakness 10/10/2017  . Muscle atrophy 10/10/2017  . Sinus node dysfunction ([place]) 10/10/2017  . Syncope and collapse 09/12/2017  . Pyelonephritis 08/26/2017  . Chronic diastolic heart failure ([place]) 07/19/2017  . HTN (hypertension) 07/19/2017  . Painful total knee replacement ([place]) 09/23/2016  . 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 ATRIAL FIBRILLATION 06/13/2008  . PACEMAKER-[employer] 06/13/2008    Orientation RESPIRATION BLADDER Height & Weight     Self,Time,Situation,Place  Normal Continent Weight: 125 lb (56.7 kg) Height:  5\' 7"  (170.2 cm)  BEHAVIORAL SYMPTOMS/MOOD NEUROLOGICAL BOWEL NUTRITION STATUS      Continent Diet (heart healthy, thin liquids)  AMBULATORY STATUS COMMUNICATION OF NEEDS Skin   Independent Verbally Normal                       Personal Care Assistance Level of  Assistance  Feeding,Bathing,Dressing Bathing Assistance: Independent Feeding assistance: Independent Dressing Assistance: Independent     Functional Limitations Info  Sight,Hearing,Speech Sight Info: Adequate Hearing Info: Adequate Speech Info: Adequate    SPECIAL CARE FACTORS FREQUENCY                       Contractures Contractures Info: Not present    Additional Factors Info  Code Status,Allergies Code Status Info: DNR Allergies Info: Latex, Other, Tape, Xarelto (Rivaroxaban), Nickel           Current Medications (05/19/2020):  This is the current hospital active medication list Current Facility-Administered Medications  Medication Dose Route Frequency Provider Last Rate Last Admin  . acetaminophen (TYLENOL) tablet 650 mg  650 mg Oral Q6H PRN Howerter, Justin B, DO       Or  . acetaminophen (TYLENOL) suppository 650 mg  650 mg Rectal Q6H PRN Howerter, Justin B, DO      . apixaban (ELIQUIS) tablet 2.5 mg  2.5 mg Oral BID Howerter, Justin B, DO   2.5 mg at 05/19/20 0904  . Chlorhexidine Gluconate Cloth 2 % PADS 6 each  6 each Topical Q0600 Athena Masse, MD   6 each at 05/19/20 0600  . LORazepam (ATIVAN) injection 0.5 mg  0.5 mg Intravenous Q6H PRN Howerter, Justin B, DO      . mupirocin ointment (BACTROBAN) 2 % 1 application  1 application Nasal BID Athena Masse, MD   1 application at 37/62/83 [phone]  . nadolol (CORGARD) tablet 60 mg  60  mg Oral BID Howerter, Justin B, DO   60 mg at 05/19/20 8850     Discharge Medications: Please see discharge summary for a list of discharge medications.  Relevant Imaging Results:  Relevant Lab Results:   Additional Information [phone]  Eileen Stanford, LCSW

## 2020-05-20 LAB — URINE CULTURE: Culture: >=100000 — AB

## 2020-05-20 LAB — CALCIUM, IONIZED: Calcium, Ionized, Serum: 4.9 mg/dL (ref 4.5–5.6)

## 2020-05-21 DIAGNOSIS — I110 Hypertensive heart disease with heart failure: Secondary | ICD-10-CM | POA: Diagnosis not present

## 2020-05-21 DIAGNOSIS — I5042 Chronic combined systolic (congestive) and diastolic (congestive) heart failure: Secondary | ICD-10-CM | POA: Diagnosis not present

## 2020-05-21 DIAGNOSIS — I480 Paroxysmal atrial fibrillation: Secondary | ICD-10-CM | POA: Diagnosis not present

## 2020-05-29 ENCOUNTER — Ambulatory Visit: Payer: Medicare HMO | Admitting: Medical

## 2020-05-29 ENCOUNTER — Encounter: Payer: Self-pay | Admitting: Medical

## 2020-05-29 ENCOUNTER — Other Ambulatory Visit: Payer: Self-pay

## 2020-05-29 VITALS — BP 120/82 | HR 71 | Ht 67.0 in | Wt 129.0 lb

## 2020-05-29 DIAGNOSIS — Z950 Presence of cardiac pacemaker: Secondary | ICD-10-CM

## 2020-05-29 DIAGNOSIS — R531 Weakness: Secondary | ICD-10-CM | POA: Diagnosis not present

## 2020-05-29 DIAGNOSIS — I5022 Chronic systolic (congestive) heart failure: Secondary | ICD-10-CM | POA: Diagnosis not present

## 2020-05-29 DIAGNOSIS — I10 Essential (primary) hypertension: Secondary | ICD-10-CM | POA: Diagnosis not present

## 2020-05-29 DIAGNOSIS — I951 Orthostatic hypotension: Secondary | ICD-10-CM | POA: Diagnosis not present

## 2020-05-29 DIAGNOSIS — I495 Sick sinus syndrome: Secondary | ICD-10-CM

## 2020-05-29 DIAGNOSIS — I4891 Unspecified atrial fibrillation: Secondary | ICD-10-CM | POA: Diagnosis not present

## 2020-05-29 MED ORDER — FUROSEMIDE 40 MG PO TABS: 40.0000 mg | ORAL_TABLET | ORAL | 5 refills | Status: DC | PRN

## 2020-05-29 MED ORDER — FUROSEMIDE 40 MG PO TABS: 40.0000 mg | ORAL_TABLET | ORAL | 3 refills | Status: DC | PRN

## 2020-05-29 NOTE — Patient Instructions (Signed)
Medication Instructions:  Your physician recommends that you continue on your current medications as directed. Please refer to the Current Medication list given to you today.  *If you need a refill on your cardiac medications before your next appointment, please call your pharmacy*   Lab Work:  None ordered   Testing/Procedures:  None ordered   Follow-Up: At [facility], you and your health needs are our priority.  As part of our continuing mission to provide you with exceptional heart care, we have created designated Provider Care Teams.  These Care Teams include your primary Cardiologist (physician) and Advanced Practice Providers (APPs -  Physician Assistants and Nurse Practitioners) who all work together to provide you with the care you need, when you need it.  We recommend signing up for the patient portal called "MyChart".  Sign up information is provided on this After Visit Summary.  MyChart is used to connect with patients for Virtual Visits (Telemedicine).  Patients are able to view lab/test results, encounter notes, upcoming appointments, etc.  Non-urgent messages can be sent to your provider as well.   To learn more about what you can do with MyChart, go to [email or web address].    Your next appointment:    Follow up with Dr. Caryl Comes next available  The format for your next appointment:   In Person  Provider:   [name], MD   Other Instructions  We will work on referral for Physical Therapy

## 2020-05-29 NOTE — Progress Notes (Signed)
Cardiology Office Note:    Date:  05/29/2020   ID:  NIGEL WESSMAN, DOB [birth year], MRN [id number]  PCP:  Kirk Ruths, MD  [facility] Cardiologist:  None  [facility] Electrophysiologist:  None   Referring MD: Kirk Ruths, MD   Chief Complaint: Hospital follow-up  History of Present Illness:    Alejandra Wiley is a 87 y.o. female with a hx of paroxysmal A. fib, sick sinus syndrome status post dual-chamber MDP PPM in 2005 with generator change in 2014, HTN, and HFpEF who presents for hospital follow-up.  Patient has seen Dr. Rockey Situ in the past but more recently has been following with Dr. Caryl Comes regularly.  Patient has a long history of atrial fibrillation and atrial arrhythmias.  History of bradycardia requiring backup bradycardia pacing.  She previously took flecainide which resulted in prolonged AV nodal conduction and ventricular pacing.  LV function noted to deteriorate.   Patient was hospitalized 08/2017 was syncope thought to be orthostatic, with A. fib possibly contributing.  Cannot was increased.  Pacemaker interrogation showed failure to capture atrial rhythms and device was reprogrammed.  She was hospitalized January 2020 for right-sided stroke.  INR was subtherapeutic at 1.52.  Carotids negative.  Coumadin was discontinued and she was started on Eliquis 2.5 mg twice daily.  Echo 11/2018 showed normalization of EF. Flecainide was discontinued due to subsequent normalization of LV function.    She had an interval telehealth visit and patient reported orthostasis and beta-blocker was decreased.    Patient was last seen 08/07/2019 by Dr. Caryl Comes.  Was doing well.  Euvolemic on exam.  Blood pressure minimally elevated, but with a history of orthostasis beta-blocker was untouched.  Some minimal A. tach on device noted.  No issues on anticoagulation.  Patient was admitted 4/30-5/2 for lightheadedness, palpitations, weakness found to be in rapid A. fib with  rates in the 170s.  Nadolol was increased and IV dilt was started provement of heart rate.  Echo showed LVEF 50% with grade 2 diastolic dysfunction. No EKGs showing elevated rates, only SR  Today, the patient reports she feels weak. She has been napping a lot. She has been trying to gain weight. Appetite is low, but still eating. No lightheadedness right now, this is occasional. She is taking nadolol 60mg  daily. No more palpitations. No chest pain, sob, LLE, orthopnea, pnd . She takes lasix as needed, has not needed. BP wnl today, Heart rate 71bpm.EKG today shows a-paced rhythm, 71bpm, PSI 269ms, LVH Refill lasix. Requesting refarral to PT.   Past Medical History:  Diagnosis Date  . Bradycardia   . 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 CHF (congestive heart failure) ([place])   . Hypertension   . Pacemaker 12/06/2003   Medtronic Impulse DTDR01  . Pacemaker 2015  . PAF (paroxysmal atrial fibrillation) (HCC)    a. on Coumadin and flecainide; b. CHADS2VASc 3 (HTN, age x 2, female)  . Sick sinus syndrome ([place])   . Sinoatrial node dysfunction (HCC)   . Stroke ([place])   . Vaginal intraepithelial neoplasia     Past Surgical History:  Procedure Laterality Date  . APPENDECTOMY    . HERNIA REPAIR     lower abd  . IMAGE GUIDED SINUS SURGERY  2015  . INSERT / REPLACE / REMOVE PACEMAKER  12/06/2003   Medtronic impulse DTDR01  . JOINT REPLACEMENT Right    knee  . KYPHOPLASTY N/A 06/23/2017   Procedure: [id number];  Surgeon: Rudene Christians,  Legrand Como, MD;  Location: [facility] ORS;  Service: Orthopedics;  Laterality: N/A;  . LEEP  1999  . ORIF HIP FRACTURE Right 02/2016  . PACEMAKER GENERATOR CHANGE N/A 09/18/2012   Procedure: PACEMAKER GENERATOR CHANGE;  Surgeon: Deboraha Sprang, MD;  Location: [facility] CATH LAB;  Service: Cardiovascular;  Laterality: N/A;  . TONSILLECTOMY    . TOTAL KNEE ARTHROPLASTY    . TOTAL KNEE REVISION Right 09/23/2016   Procedure: TOTAL KNEE REVISION-POLYETHYLENE EXCHANGE;   Surgeon: Hessie Knows, MD;  Location: [facility] ORS;  Service: Orthopedics;  Laterality: Right;  Marland Kitchen VAGINAL HYSTERECTOMY      Current Medications: No outpatient medications have been marked as taking for the 05/29/20 encounter (Appointment) with Kathlen Mody, Shaela Boer H, PA-C.     Allergies:   Latex, Other, Tape, Xarelto [rivaroxaban], and Nickel   Social History   Socioeconomic History  . Marital status: Married    Spouse name: Not on file  . Number of children: Not on file  . Years of education: Not on file  . Highest education level: Not on file  Occupational History  . Occupation: Part time  Tobacco Use  . Smoking status: Never Smoker  . Smokeless tobacco: Never Used  Vaping Use  . Vaping Use: Never used  Substance and Sexual Activity  . Alcohol use: Yes    Alcohol/week: 1.0 standard drink    Types: 1 Glasses of wine per week    Comment: occasionally  . Drug use: No  . Sexual activity: Not Currently  Other Topics Concern  . Not on file  Social History Narrative   Pt gets regular exercise.   Social Determinants of Health   Financial Resource Strain: Not on file  Food Insecurity: Not on file  Transportation Needs: Not on file  Physical Activity: Not on file  Stress: Not on file  Social Connections: Not on file     Family History: The patient's family history includes Diabetes in her daughter and son; Heart disease in her father; Stroke in her mother.  ROS:   Please see the history of present illness.     All other systems reviewed and are negative.  EKGs/Labs/Other Studies Reviewed:    The following studies were reviewed today:  Echo 05/19/20 1. Left ventricular ejection fraction, by estimation, is 50%. The left  ventricle has low normal function. The left ventricle has no regional wall  motion abnormalities. There is mild left ventricular hypertrophy. Left  ventricular diastolic parameters are  consistent with Grade II diastolic dysfunction (pseudonormalization).   2. Right ventricular systolic function is low normal. The right  ventricular size is normal.  3. Left atrial size was moderately dilated.  4. The mitral valve is normal in structure. Mild to moderate mitral valve  regurgitation.  5. The aortic valve is tricuspid. Aortic valve regurgitation is not  visualized.  6. The inferior vena cava is normal in size with greater than 50%  respiratory variability, suggesting right atrial pressure of 3 mmHg.   Ppm check 04/18/20 AF burden 6.2%; longest >96 hours; EGMs suggest A. Fib and AT; V rate histogram shows ~35% binned >100 bpm. History of A. Fib and on apixaban.  6 new VHR events logged 3 to 8 seconds w/ rates 150's bpm; EGMs suggest SVT  Next remote 91 days. HB   EKG:  EKG is ordered today.  The ekg ordered today demonstrates a-paced rhythm, 71bpm, PSI 265ms, LVH  Recent Labs: 05/17/2020: ALT 14; B Natriuretic Peptide 371.0; TSH 2.420 05/18/2020: Hemoglobin  11.3; Platelets 194 05/19/2020: BUN 28; Creatinine, Ser 0.77; Magnesium 2.2; Potassium 4.1; Sodium 138  Recent Lipid Panel    Component Value Date/Time   CHOL 157 02/10/2018 0440   TRIG 61 02/10/2018 0440   HDL 62 02/10/2018 0440   CHOLHDL 2.5 02/10/2018 0440   VLDL 12 02/10/2018 0440   LDLCALC 83 02/10/2018 0440    Physical Exam:    VS:  There were no vitals taken for this visit.    Wt Readings from Last 3 Encounters:  05/18/20 125 lb (56.7 kg)  08/07/19 133 lb 8 oz (60.6 kg)  11/28/18 130 lb (59 kg)     GEN: Well nourished, well developed in no acute distress HEENT: Normal NECK: No JVD; No carotid bruits LYMPHATICS: No lymphadenopathy CARDIAC: RRR, + murmur, rubs, gallops RESPIRATORY:  Clear to auscultation without rales, wheezing or rhonchi  ABDOMEN: Soft, non-tender, non-distended MUSCULOSKELETAL:  No edema; No deformity  SKIN: Warm and dry NEUROLOGIC:  Alert and oriented x 3 PSYCHIATRIC:  Normal affect   ASSESSMENT:    1. Atrial fibrillation, unspecified type  ([place])   2. Orthostatic hypotension   3. Chronic systolic (congestive) heart failure (HCC)   4. Cardiac pacemaker in situ   5. Sinus node dysfunction (HCC)   6. Essential hypertension    PLAN:    In order of problems listed above:  PAF with recent RVR Rates reportedly elevated to 170s per EMS report, improved with IV dilt in the ER. No EKG showing elevated rates. Since discharge she has been feeling okay, still weak. No further palpitation, significant lightheadedness, or dizziness. At d/c her nadolol was incressed to 60mg  daily. She is tolerating this well. EKG today shows a-paced rhythm. Continue Eliquis 25mg  BID. Follow-up with Dr. Caryl Comes.   Hypertension with history of orthostasis Nadolol increased as above. Monitor for worsening orthostasis symptoms. Can always decrease BB back down to 40mg  daily.   HFpEF with improved LV function Euvolemic on exam. Echo during admission showed LVEF 55-60%, G2DD, mod dilated LA, mild to mod MR. She takes lasix as needed, but hasn't needed it recently. Will refill lasix.  Weakness Will try and refer to PT, PCP might have to do this.   SSS s/p pacemaker Most recent check 04/18/2020 showed atrial tachycardia. Continue BB. follow-up with dr. Caryl Comes as above.   Disposition: Follow up in 3 month(s) with Dr. Caryl Comes    Signed, Sarita, PA-C  05/29/2020 8:33 AM    [place]

## 2020-06-02 ENCOUNTER — Telehealth: Payer: Self-pay | Admitting: Internal Medicine

## 2020-06-02 MED ORDER — POTASSIUM CHLORIDE CRYS ER 20 MEQ PO TBCR: 10.0000 meq | EXTENDED_RELEASE_TABLET | Freq: Two times a day (BID) | ORAL | 0 refills | Status: DC

## 2020-06-02 NOTE — Telephone Encounter (Signed)
Requested Prescriptions   Signed Prescriptions Disp Refills   potassium chloride SA (KLOR-CON) 20 MEQ tablet 90 tablet 0    Sig: Take 0.5 tablets (10 mEq total) by mouth 2 (two) times daily. Take 1/2 tablet QAM and 1/2 tablet QPM    Authorizing Provider: Antony Madura    Ordering User: NEWCOMER MCCLAIN, Kristie Bracewell L

## 2020-06-02 NOTE — Telephone Encounter (Signed)
*  STAT* If patient is at the pharmacy, call can be transferred to refill team.   1. Which medications need to be refilled? (please list name of each medication and dose if known) Potassium Chloride SA   2. Which pharmacy/location (including street and city if local pharmacy) is medication to be sent to? [facility], mail delivery  3. Do they need a 30 day or 90 day supply? 90 day

## 2020-06-05 NOTE — Addendum Note (Signed)
Addended by: Darlyne Russian on: 06/05/2020 04:05 PM   Modules accepted: Orders

## 2020-06-07 ENCOUNTER — Other Ambulatory Visit: Payer: Self-pay | Admitting: Home Health

## 2020-06-07 ENCOUNTER — Telehealth: Payer: Self-pay | Admitting: Home Health

## 2020-06-07 NOTE — Telephone Encounter (Signed)
   Returned page to the patient, who reports she is not feeling very well today, reports increased fatigue that she felt she is wiped out.  She feels her heart beats are erratic.  She has a new meter that reads her heart rate fluctuates between 68-138 bpm.  Recheck of her heart rate during telephone encounter was 70 bpm.  She endorses mild shortness of breath and dizziness.  She is wondering if she should take additional dose of Nadolol today.    Chart reviewed, patient suffers paroxysmal atrial fibrillation with intermittent RVR, recent hospitalization with nadolol increased to 60 mg daily.  She has hypertension, HFpEF, and sick sinus syndrome with history of pacemaker implant.  She follows Dr. Caryl Comes and was last evaluated in the office on 05/29/2020 by APP, EKG was atrial paced rhythm.  She was continued on nadolol 60 mg daily for rate control.  She was referred to PT due to generalized weakness.   Advised patient not to take additional nadolol as heart rate is controlled at this time. Advised patient to go to nearest urgent care or ER for in-person evaluation, as she is significantly symptomatic today, concern for A fib RVR, will need 12 lead EKG evaluation.  Advised patient not to drive herself as she is feeling dizzy. Patient states she will have her neighbor drive her. She has agreed with above plan.

## 2020-06-09 ENCOUNTER — Ambulatory Visit: Payer: Medicare HMO | Admitting: Dermatology

## 2020-06-09 ENCOUNTER — Other Ambulatory Visit: Payer: Self-pay

## 2020-06-09 DIAGNOSIS — L820 Inflamed seborrheic keratosis: Secondary | ICD-10-CM

## 2020-06-09 DIAGNOSIS — D2339 Other benign neoplasm of skin of other parts of face: Secondary | ICD-10-CM

## 2020-06-09 DIAGNOSIS — D1800 Hemangioma unspecified site: Secondary | ICD-10-CM | POA: Diagnosis not present

## 2020-06-09 DIAGNOSIS — L578 Other skin changes due to chronic exposure to nonionizing radiation: Secondary | ICD-10-CM | POA: Diagnosis not present

## 2020-06-09 DIAGNOSIS — L821 Other seborrheic keratosis: Secondary | ICD-10-CM

## 2020-06-09 DIAGNOSIS — L814 Other melanin hyperpigmentation: Secondary | ICD-10-CM | POA: Diagnosis not present

## 2020-06-09 DIAGNOSIS — D239 Other benign neoplasm of skin, unspecified: Secondary | ICD-10-CM

## 2020-06-09 DIAGNOSIS — D229 Melanocytic nevi, unspecified: Secondary | ICD-10-CM | POA: Diagnosis not present

## 2020-06-09 DIAGNOSIS — H02836 Dermatochalasis of left eye, unspecified eyelid: Secondary | ICD-10-CM

## 2020-06-09 DIAGNOSIS — H02834 Dermatochalasis of left upper eyelid: Secondary | ICD-10-CM | POA: Diagnosis not present

## 2020-06-09 DIAGNOSIS — Z1283 Encounter for screening for malignant neoplasm of skin: Secondary | ICD-10-CM | POA: Diagnosis not present

## 2020-06-09 NOTE — Telephone Encounter (Signed)
Taylor good morning and hope you had a good weekend.  Could you see if this lady is available for  telehealth visit  tomorrow at 28 or so or whether she would be able to come to the office  she has been struggling with here afib and looks like we need a plan Thanks SK

## 2020-06-09 NOTE — Patient Instructions (Addendum)
Cryotherapy Aftercare  . Wash gently with soap and water everyday.   Marland Kitchen Apply Vaseline and Band-Aid daily until healed.  Recommend a Claritin or Allegra at bedtime if approved by heart doctor.   Melanoma ABCDEs  Melanoma is the most dangerous type of skin cancer, and is the leading cause of death from skin disease.  You are more likely to develop melanoma if you:  Have light-colored skin, light-colored eyes, or red or blond hair  Spend a lot of time in the sun  Tan regularly, either outdoors or in a tanning bed  Have had blistering sunburns, especially during childhood  Have a close family member who has had a melanoma  Have atypical moles or large birthmarks  Early detection of melanoma is key since treatment is typically straightforward and cure rates are extremely high if we catch it early.   The first sign of melanoma is often a change in a mole or a new dark spot.  The ABCDE system is a way of remembering the signs of melanoma.  A for asymmetry:  The two halves do not match. B for border:  The edges of the growth are irregular. C for color:  A mixture of colors are present instead of an even brown color. D for diameter:  Melanomas are usually (but not always) greater than 17mm - the size of a pencil eraser. E for evolution:  The spot keeps changing in size, shape, and color.  Please check your skin once per month between visits. You can use a small mirror in front and a large mirror behind you to keep an eye on the back side or your body.   If you see any new or changing lesions before your next follow-up, please call to schedule a visit.  Please continue daily skin protection including broad spectrum sunscreen SPF 30+ to sun-exposed areas, reapplying every 2 hours as needed when you're outdoors.   If you have any questions or concerns for your doctor, please call our main line at [phone] and press option 4 to reach your doctor's medical assistant. If no one answers,  please leave a voicemail as directed and we will return your call as soon as possible. Messages left after 4 pm will be answered the following business day.   You may also send [place] a message via [place]. We typically respond to MyChart messages within 1-2 business days.  For prescription refills, please ask your pharmacy to contact our office. Our fax number is [phone].  If you have an urgent issue when the clinic is closed that cannot wait until the next business day, you can page your doctor at the number below.    Please note that while we do our best to be available for urgent issues outside of office hours, we are not available 24/7.   If you have an urgent issue and are unable to reach [place], you may choose to seek medical care at your doctor's office, retail clinic, urgent care center, or emergency room.  If you have a medical emergency, please immediately call 911 or go to the emergency department.  Pager Numbers  - Dr. Nehemiah Massed: [phone]  - Dr. Laurence Ferrari: [phone]  - Dr. Nicole Kindred: [phone]  In the event of inclement weather, please call our main line at [phone] for an update on the status of any delays or closures.  Dermatology Medication Tips: Please keep the boxes that topical medications come in in order to help keep track of the instructions about  where and how to use these. Pharmacies typically print the medication instructions only on the boxes and not directly on the medication tubes.   If your medication is too expensive, please contact our office at [phone] option 4 or send [place] a message through [place].   We are unable to tell what your co-pay for medications will be in advance as this is different depending on your insurance coverage. However, we may be able to find a substitute medication at lower cost or fill out paperwork to get insurance to cover a needed medication.   If a prior authorization is required to get your medication covered by your  insurance company, please allow [place] 1-2 business days to complete this process.  Drug prices often vary depending on where the prescription is filled and some pharmacies may offer cheaper prices.  The website www.goodrx.com contains coupons for medications through different pharmacies. The prices here do not account for what the cost may be with help from insurance (it may be cheaper with your insurance), but the website can give you the price if you did not use any insurance.  - You can print the associated coupon and take it with your prescription to the pharmacy.  - You may also stop by our office during regular business hours and pick up a GoodRx coupon card.  - If you need your prescription sent electronically to a different pharmacy, notify our office through [facility] or by phone at [phone] option 4.

## 2020-06-09 NOTE — Progress Notes (Signed)
Follow-Up Visit   Subjective  Alejandra Wiley is a 87 y.o. female who presents for the following: Annual Exam (Patient here for full body skin exam and skin cancer screening. Patient with no hx of skin cancer and not aware of any new or changing spots today. ).  He has an irritating lesion on her nose.  She also has a dilated pore in her nose she would like checked to discuss treatment options.  She also complains of swelling of her left upper eyelid that is worse when she wakes up in the morning and improves.  The following portions of the chart were reviewed this encounter and updated as appropriate:   Tobacco  Allergies  Meds  Problems  Med Hx  Surg Hx  Fam Hx     Review of Systems:  No other skin or systemic complaints except as noted in HPI or Assessment and Plan.  Objective  Well appearing patient in no apparent distress; mood and affect are within normal limits.  A full examination was performed including scalp, head, eyes, ears, nose, lips, neck, chest, axillae, abdomen, back, buttocks, bilateral upper extremities, bilateral lower extremities, hands, feet, fingers, toes, fingernails, and toenails. All findings within normal limits unless otherwise noted below.  Objective  left nasal bridge: Erythematous keratotic or waxy stuck-on papule or plaque.   Objective  Left Eye: Edema   Objective  Right Nose: Dilated pore   Assessment & Plan  Inflamed seborrheic keratosis left nasal bridge Destruction of lesion - left nasal bridge Complexity: simple   Destruction method: cryotherapy   Informed consent: discussed and consent obtained   Timeout:  patient name, date of birth, surgical site, and procedure verified Lesion destroyed using liquid nitrogen: Yes   Region frozen until ice ball extended beyond lesion: Yes   Outcome: patient tolerated procedure well with no complications   Post-procedure details: wound care instructions given    Dermatochalasis with morning  edema of left upper eyelid  Left Eye Recommend a Claritin or Allegra at bedtime to see if that helps. Chronic and persistent May need blepharoplasty.   She is advised to consult her ophthalmologist for evaluation of this.  Dilated pore of [name] Right Nose Chronic and persistent. Discussed excision but do not recommend.  Skin cancer screening  Lentigines - Scattered tan macules - Due to sun exposure - Benign-appering, observe - Recommend daily broad spectrum sunscreen SPF 30+ to sun-exposed areas, reapply every 2 hours as needed. - Call for any changes  Seborrheic Keratoses - Stuck-on, waxy, tan-brown papules and/or plaques  - Benign-appearing - Discussed benign etiology and prognosis. - Observe - Call for any changes  Melanocytic Nevi - Tan-brown and/or pink-flesh-colored symmetric macules and papules - Benign appearing on exam today - Observation - Call clinic for new or changing moles - Recommend daily use of broad spectrum spf 30+ sunscreen to sun-exposed areas.   Hemangiomas - Red papules - Discussed benign nature - Observe - Call for any changes  Actinic Damage - Chronic condition, secondary to cumulative UV/sun exposure - diffuse scaly erythematous macules with underlying dyspigmentation - Recommend daily broad spectrum sunscreen SPF 30+ to sun-exposed areas, reapply every 2 hours as needed.  - Staying in the shade or wearing long sleeves, sun glasses (UVA+UVB protection) and wide brim hats (4-inch brim around the entire circumference of the hat) are also recommended for sun protection.  - Call for new or changing lesions.  Skin cancer screening performed today.  Return in about 1  year (around 06/09/2021) for TBSE.  Alejandra Wiley, RMA, am acting as scribe for Sarina Ser, MD . Documentation: I have reviewed the above documentation for accuracy and completeness, and I agree with the above.  Sarina Ser, MD

## 2020-06-12 NOTE — Telephone Encounter (Signed)
I spoke with the patient to see how she is doing today. She advised she is feeling ok today, but has been having "a time" with her a-fib. I have advised her Dr. Caryl Comes would like to see her in the office to follow up on next steps to treat her atrial fibrillation.  I have offered her an appointment on Tuesday 06/17/20 at 11:00 am. The patient voices understanding and is agreeable.

## 2020-06-14 ENCOUNTER — Encounter: Payer: Self-pay | Admitting: Dermatology

## 2020-06-17 ENCOUNTER — Encounter: Payer: Self-pay | Admitting: Internal Medicine

## 2020-06-17 ENCOUNTER — Other Ambulatory Visit: Payer: Self-pay

## 2020-06-17 ENCOUNTER — Ambulatory Visit: Payer: Medicare HMO | Admitting: Internal Medicine

## 2020-06-17 VITALS — BP 130/84 | HR 80 | Ht 67.0 in | Wt 127.0 lb

## 2020-06-17 DIAGNOSIS — Z950 Presence of cardiac pacemaker: Secondary | ICD-10-CM | POA: Diagnosis not present

## 2020-06-17 DIAGNOSIS — I495 Sick sinus syndrome: Secondary | ICD-10-CM | POA: Diagnosis not present

## 2020-06-17 DIAGNOSIS — I5022 Chronic systolic (congestive) heart failure: Secondary | ICD-10-CM | POA: Diagnosis not present

## 2020-06-17 DIAGNOSIS — I10 Essential (primary) hypertension: Secondary | ICD-10-CM

## 2020-06-17 DIAGNOSIS — I480 Paroxysmal atrial fibrillation: Secondary | ICD-10-CM

## 2020-06-17 LAB — PACEMAKER DEVICE OBSERVATION

## 2020-06-17 NOTE — Patient Instructions (Signed)
Medication Instructions:  - Your physician recommends that you continue on your current medications as directed. Please refer to the Current Medication list given to you today.  *If you need a refill on your cardiac medications before your next appointment, please call your pharmacy*   Lab Work: - none ordered  If you have labs (blood work) drawn today and your tests are completely normal, you will receive your results only by: Marland Kitchen MyChart Message (if you have MyChart) OR . A paper copy in the mail If you have any lab test that is abnormal or we need to change your treatment, we will call you to review the results.   Testing/Procedures: - none ordered   Follow-Up: At [facility], you and your health needs are our priority.  As part of our continuing mission to provide you with exceptional heart care, we have created designated Provider Care Teams.  These Care Teams include your primary Cardiologist (physician) and Advanced Practice Providers (APPs -  Physician Assistants and Nurse Practitioners) who all work together to provide you with the care you need, when you need it.  We recommend signing up for the patient portal called "MyChart".  Sign up information is provided on this After Visit Summary.  MyChart is used to connect with patients for Virtual Visits (Telemedicine).  Patients are able to view lab/test results, encounter notes, upcoming appointments, etc.  Non-urgent messages can be sent to your provider as well.   To learn more about what you can do with MyChart, go to [email or web address].    Your next appointment:   8 week(s)  The format for your next appointment:   In Person  Provider:   [name], MD   Other Instructions n/a

## 2020-06-17 NOTE — Progress Notes (Signed)
Patient Care Team: Kirk Ruths, MD as PCP - General (Unknown Physician Specialty)   HPI  Alejandra Wiley is a 87 y.o. female seen in followup for palpitations associated with atrial arrhythmias as well as bradycardia requiring backup bradycardia pacing. She previously took flecainide.  Resulted in prolonged AV nodal conduction and ventricular pacing.  Deterioration of LV function; flecainide discontinued with subsequent normalization of LV function.    Date VP%  3/14 1  3/*16 95  10/17 95  10/18 92-DDD-DDI  6/19 68  10/19 84  4/20  3.3  7/21 9     DATE TEST EF Vp %    /13 Echo  55-65%    8/19 Echo  50-55% 84%  1/20 Echo   30-35%   11/21 Echo  55-60      Date Cr Hgb  8/18 0.67 12.2  9/18 0.64 10  1/20 0.95 11.8  6/20 0.9 11.7  Lab  1/20 Hospitalized with stroke- R sided weakness.  INR subtherapeutic 1.52.  >>imaging  CT  No acute stroke.  Carotids neg.  Coumadin d/c and apixoban started ( 2.5 mg bid -wt 55 kg;age->82)   B have reached out to the patient because of increasing atrial fibrillation burden she was seen in the emergency room 4/22 with increasing symptoms and found to be in rapid atrial fibrillation.  More recently, she called the EMS at [facility] where they recorded her pulse in the 40s; she was at this point according to her device in atrial fibrillation again with rapid rates.  She is frustrated that the sporadic nature of these complaints and interfere with her choices to do certain types of things like playing the piano  These episodes cause her to "feel lousy "characterized by fatigue and some lightheadedness but no significant shortness of breath or chest pain.  Has not had peripheral edema no clinical bleeding   .  She is redecorating her [employer].Marland Kitchen    DATE PR interval QRSduration Dose  6/13 AR 268 112 50  6/19 AR 308  VP 138 75  9/19 -- -- 100  7/20 AR420 Vp 174 100  08/15/18 AR 242 92  0          Thromboembolic risk factors  ( age  -2, HTN-1, TIA/CVA-2, DM-1, CHF-1, Gender-1) for a CHADSVASc Score of >=8    Past Medical History:  Diagnosis Date  . Bradycardia   . 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 CHF (congestive heart failure) ([place])   . Hypertension   . Pacemaker 12/06/2003   Medtronic Impulse DTDR01  . Pacemaker 2015  . PAF (paroxysmal atrial fibrillation) (HCC)    a. on Coumadin and flecainide; b. CHADS2VASc 3 (HTN, age x 2, female)  . Sick sinus syndrome ([place])   . Sinoatrial node dysfunction (HCC)   . Stroke ([place])   . Vaginal intraepithelial neoplasia     Past Surgical History:  Procedure Laterality Date  . APPENDECTOMY    . HERNIA REPAIR     lower abd  . IMAGE GUIDED SINUS SURGERY  2015  . INSERT / REPLACE / REMOVE PACEMAKER  12/06/2003   Medtronic impulse DTDR01  . JOINT REPLACEMENT Right    knee  . KYPHOPLASTY N/A 06/23/2017   Procedure: [id number];  Surgeon: Hessie Knows, MD;  Location: [facility] ORS;  Service: Orthopedics;  Laterality: N/A;  . LEEP  1999  . ORIF HIP FRACTURE Right 02/2016  . PACEMAKER GENERATOR CHANGE  N/A 09/18/2012   Procedure: PACEMAKER GENERATOR CHANGE;  Surgeon: Deboraha Sprang, MD;  Location: [facility] CATH LAB;  Service: Cardiovascular;  Laterality: N/A;  . TONSILLECTOMY    . TOTAL KNEE ARTHROPLASTY    . TOTAL KNEE REVISION Right 09/23/2016   Procedure: TOTAL KNEE REVISION-POLYETHYLENE EXCHANGE;  Surgeon: Hessie Knows, MD;  Location: [facility] ORS;  Service: Orthopedics;  Laterality: Right;  Marland Kitchen VAGINAL HYSTERECTOMY      Current Outpatient Medications  Medication Sig Dispense Refill  . acetaminophen (TYLENOL) 500 MG tablet Take 500 mg by mouth every 8 (eight) hours as needed for mild pain or headache.    Marland Kitchen ELIQUIS 2.5 MG TABS tablet TAKE 1 TABLET TWICE DAILY 180 tablet 1  . fluticasone (FLONASE) 50 MCG/ACT nasal spray Place 2 sprays into both nostrils daily as needed for allergies or rhinitis.     . furosemide (LASIX) 40 MG tablet Take 1 tablet (40 mg total)  by mouth as needed. 90 tablet 3  . ibandronate (BONIVA) 150 MG tablet Take 150 mg by mouth every 30 (thirty) days. Take in the morning with a full glass of water, on an empty stomach, and do not take anything else by mouth or lie down for the next 30 min.    . Multiple Vitamins-Minerals (SENIOR MULTIVITAMIN PLUS PO) Take by mouth.    . nadolol (CORGARD) 20 MG tablet Take 3 tablets (60 mg total) by mouth 2 (two) times daily. 180 tablet 1  . potassium chloride SA (KLOR-CON) 20 MEQ tablet Take 0.5 tablets (10 mEq total) by mouth 2 (two) times daily. Take 1/2 tablet QAM and 1/2 tablet QPM 90 tablet 0   No current facility-administered medications for this visit.    Allergies  Allergen Reactions  . Latex Other (See Comments)    blisters  . Other Other (See Comments)    Blisters/ paper tape is Market researcher paper tape is Market researcher paper tape is Ok  Camera operator is Ok  . Tape Other (See Comments)    Blisters/ paper tape is Market researcher paper tape is Ok  . Xarelto [Rivaroxaban] Other (See Comments)    Gums bleeding and too much bruising  . Nickel Rash and Other (See Comments)    Review of Systems negative except from HPI and PMH  Physical Exam BP 130/84 (BP Location: Left Arm, Patient Position: Sitting, Cuff Size: Normal)   Pulse 80   Ht 5\' 7"  (1.702 m)   Wt 127 lb (57.6 kg)   SpO2 98%   BMI 19.89 kg/m  Well developed and well nourished in no acute distress HENT normal Neck supple with JVP-flat Clear Device pocket well healed; without hematoma or erythema.  There is no tethering  Regular rate and rhythm, no  gallop No murmur Abd-soft with active BS No Clubbing cyanosis   edema Skin-warm and dry A & Oriented  Grossly normal sensory and motor function  ECG atrial paced at 77 with frequent PACs with intrinsic conduction and rare ventricular pacing  Assessment and  Plan  Atrial arrhythmia/Fib  Sinus node dysfunction  Hypertension  Syncope  Pacemaker   Medtronic  Dual chamber   Elevated pacing thresholds A Greater    HFpEF>> LV dysfunction resolved  Orthostatic hypotension  1 AVB   Blood pressure remains a little bit elevated; however, given her significant orthostasis we will continue her on her nadolol now increased to 60 mg twice daily.  There may be a role for a little bit of an adjunctive  medication but will hold.  Her volume status is euvolemic.  Heart failure is better.  We will continue her on furosemide 40 mg a day taken as needed.  Intercurrent atrial fibrillation is the biggest issue.  It is associated with rapid rates.  She was seen in the ER 4/22 with rapid rates and her nadolol was increased as noted above.  We will continue that for right now.  It is hard to know as her data set goes back to December as to how much impact the increase nadolol has had on her rapid rates associated with atrial fibrillation where it is about 45% of the beats have been faster than 100 bpm.  We will reset that and then recheck in 6-8 weeks.  The atrial fibrillation seems to be contributing to major lifestyle modifications which are disheartening for her.  She says she "cannot be relied upon "to be able to play when requested.  We thus broached the topic of antiarrhythmic therapies including but not limited to antiarrhythmic drugs which in her case would probably be dofetilide as she has had significant conduction system issues with flecainide previously and would next be expected to have them again with any other drug besides dofetilide/sotalol.  I favor the former because of its greater efficacy.  We also discussed catheter ablation.  She is an octogenarian; there are data for the effectiveness of catheter ablation in this cohort.  She is relatively vigorous and healthy for her age.  At this point however she would like to consider whether rhythm therapies would be worth undertaking or just to redirect her lifestyle choices.  She remains on apixaban  without clinical bleeding  47 min was spent in care of the patient including the review of records

## 2020-06-26 DIAGNOSIS — M6281 Muscle weakness (generalized): Secondary | ICD-10-CM | POA: Diagnosis not present

## 2020-06-26 DIAGNOSIS — R262 Difficulty in walking, not elsewhere classified: Secondary | ICD-10-CM | POA: Diagnosis not present

## 2020-07-01 DIAGNOSIS — R262 Difficulty in walking, not elsewhere classified: Secondary | ICD-10-CM | POA: Diagnosis not present

## 2020-07-01 DIAGNOSIS — M6281 Muscle weakness (generalized): Secondary | ICD-10-CM | POA: Diagnosis not present

## 2020-07-04 DIAGNOSIS — R262 Difficulty in walking, not elsewhere classified: Secondary | ICD-10-CM | POA: Diagnosis not present

## 2020-07-04 DIAGNOSIS — M6281 Muscle weakness (generalized): Secondary | ICD-10-CM | POA: Diagnosis not present

## 2020-07-09 DIAGNOSIS — R262 Difficulty in walking, not elsewhere classified: Secondary | ICD-10-CM | POA: Diagnosis not present

## 2020-07-09 DIAGNOSIS — M6281 Muscle weakness (generalized): Secondary | ICD-10-CM | POA: Diagnosis not present

## 2020-07-11 DIAGNOSIS — R262 Difficulty in walking, not elsewhere classified: Secondary | ICD-10-CM | POA: Diagnosis not present

## 2020-07-11 DIAGNOSIS — M6281 Muscle weakness (generalized): Secondary | ICD-10-CM | POA: Diagnosis not present

## 2020-07-15 ENCOUNTER — Encounter: Payer: Medicare HMO | Admitting: Internal Medicine

## 2020-07-17 ENCOUNTER — Other Ambulatory Visit: Payer: Self-pay | Admitting: Internal Medicine

## 2020-07-17 ENCOUNTER — Ambulatory Visit (INDEPENDENT_AMBULATORY_CARE_PROVIDER_SITE_OTHER): Payer: Medicare HMO

## 2020-07-17 ENCOUNTER — Other Ambulatory Visit: Payer: Self-pay | Admitting: Medical

## 2020-07-17 DIAGNOSIS — I495 Sick sinus syndrome: Secondary | ICD-10-CM

## 2020-07-18 NOTE — Telephone Encounter (Signed)
Pt last saw Dr Caryl Comes 06/17/20, last labs 05/19/20 Creat 0.77, age 87, weight 57.6kg, based on specified criteria pt is on appropriate dosage of Eliquis 2.5mg  BID.  Will refill rx.

## 2020-07-21 LAB — CUP PACEART REMOTE DEVICE CHECK
Battery Impedance: 1572 Ohm
Battery Remaining Longevity: 45 mo
Battery Voltage: 2.76 V
Brady Statistic AP VP Percent: 11 %
Brady Statistic AP VS Percent: 58 %
Brady Statistic AS VP Percent: 3 %
Brady Statistic AS VS Percent: 29 %
Date Time Interrogation Session: 20220701132258
Implantable Lead Implant Date: 20051130
Implantable Lead Implant Date: 20051130
Implantable Lead Location: 753859
Implantable Lead Location: 753860
Implantable Lead Model: 5076
Implantable Pulse Generator Implant Date: 20140901
Lead Channel Impedance Value: 618 Ohm
Lead Channel Impedance Value: 728 Ohm
Lead Channel Pacing Threshold Amplitude: 0.375 V
Lead Channel Pacing Threshold Amplitude: 1 V
Lead Channel Pacing Threshold Pulse Width: 0.4 ms
Lead Channel Pacing Threshold Pulse Width: 0.4 ms
Lead Channel Setting Pacing Amplitude: 2 V
Lead Channel Setting Pacing Amplitude: 2.5 V
Lead Channel Setting Pacing Pulse Width: 0.46 ms
Lead Channel Setting Sensing Sensitivity: 5.6 mV

## 2020-07-23 DIAGNOSIS — M6281 Muscle weakness (generalized): Secondary | ICD-10-CM | POA: Diagnosis not present

## 2020-07-23 DIAGNOSIS — R262 Difficulty in walking, not elsewhere classified: Secondary | ICD-10-CM | POA: Diagnosis not present

## 2020-07-25 DIAGNOSIS — M6281 Muscle weakness (generalized): Secondary | ICD-10-CM | POA: Diagnosis not present

## 2020-07-25 DIAGNOSIS — R262 Difficulty in walking, not elsewhere classified: Secondary | ICD-10-CM | POA: Diagnosis not present

## 2020-07-29 DIAGNOSIS — M6281 Muscle weakness (generalized): Secondary | ICD-10-CM | POA: Diagnosis not present

## 2020-07-29 DIAGNOSIS — R262 Difficulty in walking, not elsewhere classified: Secondary | ICD-10-CM | POA: Diagnosis not present

## 2020-07-30 ENCOUNTER — Telehealth: Payer: Self-pay | Admitting: Medical

## 2020-07-30 NOTE — Telephone Encounter (Signed)
Spoke with Hildred Alamin with [facility]. Reviewed that we received orders for physical therapy and those need to be sent to patients primary care provider Dr. Ouida Sills with [facility]. Provided her with their number and fax as well. She verbalized understanding and will reach out to them. No further questions.

## 2020-08-01 DIAGNOSIS — R262 Difficulty in walking, not elsewhere classified: Secondary | ICD-10-CM | POA: Diagnosis not present

## 2020-08-01 DIAGNOSIS — M6281 Muscle weakness (generalized): Secondary | ICD-10-CM | POA: Diagnosis not present

## 2020-08-05 DIAGNOSIS — M6281 Muscle weakness (generalized): Secondary | ICD-10-CM | POA: Diagnosis not present

## 2020-08-05 DIAGNOSIS — R262 Difficulty in walking, not elsewhere classified: Secondary | ICD-10-CM | POA: Diagnosis not present

## 2020-08-06 NOTE — Progress Notes (Signed)
Remote pacemaker transmission.   

## 2020-08-07 ENCOUNTER — Ambulatory Visit: Payer: Medicare HMO | Admitting: Internal Medicine

## 2020-08-07 ENCOUNTER — Other Ambulatory Visit: Payer: Self-pay

## 2020-08-07 ENCOUNTER — Encounter: Payer: Self-pay | Admitting: Internal Medicine

## 2020-08-07 VITALS — BP 148/98 | HR 83 | Ht 67.0 in | Wt 123.0 lb

## 2020-08-07 DIAGNOSIS — I4891 Unspecified atrial fibrillation: Secondary | ICD-10-CM

## 2020-08-07 DIAGNOSIS — Z950 Presence of cardiac pacemaker: Secondary | ICD-10-CM | POA: Diagnosis not present

## 2020-08-07 DIAGNOSIS — I495 Sick sinus syndrome: Secondary | ICD-10-CM

## 2020-08-07 LAB — PACEMAKER DEVICE OBSERVATION

## 2020-08-07 NOTE — Progress Notes (Signed)
Patient Care Team: Kirk Ruths, MD as PCP - General (Unknown Physician Specialty)   HPI  Alejandra Wiley is a 87 y.o. female seen in followup for palpitations associated with atrial arrhythmias as well as bradycardia requiring backup bradycardia pacing. She previously took flecainide.  Resulted in prolonged AV nodal conduction and ventricular pacing.  Deterioration of LV function; flecainide discontinued with subsequent normalization of LV function.    1/20 Hospitalized with stroke- R sided weakness.  INR subtherapeutic 1.52.  >>imaging  CT  No acute stroke.  Carotids neg.  Coumadin d/c and apixoban started ( 2.5 mg bid -wt 55 kg;age->82)  4/22 hospitalized with weakness and found to have Afib RVR>>> prompting increase of her nadolol. She has been feeling good of late.  She is no longer walking with a cane.  No edema.  Shortness of breath is minimal.  She is back again playing the piano.  She comes in smiling broadly with her new man   Date VP%  3/14 1  3/*16 95  10/17 95  10/18 92-DDD-DDI  6/19 68  10/19 84  4/20  3.3  7/21 9      DATE TEST EF Vp %    /13 Echo  55-65%    8/19 Echo  50-55% 84%  1/20 Echo   30-35%   11/21 Echo  55-60   5/22 Echo  55-55%      Date Cr Hgb  8/18 0.67 12.2  9/18 0.64 10  1/20 0.95 11.8  6/20 0.9 11.7  5/22 0.77 11.3          Thromboembolic risk factors ( age  -2, HTN-1, TIA/CVA-2, DM-1, CHF-1, Gender-1) for a CHADSVASc Score of >=8    Past Medical History:  Diagnosis Date   Bradycardia    Cancer (HCC)    vaginal intraepithelial neoplasia   CHF (congestive heart failure) ([place])    Hypertension    Pacemaker 12/06/2003   Medtronic Impulse DTDR01   Pacemaker 2015   PAF (paroxysmal atrial fibrillation) (HCC)    a. on Coumadin and flecainide; b. CHADS2VASc 3 (HTN, age x 2, female)   Sick sinus syndrome ([place])    Sinoatrial node dysfunction ([place])    Stroke ([place])    Vaginal intraepithelial neoplasia     Past  Surgical History:  Procedure Laterality Date   APPENDECTOMY     HERNIA REPAIR     lower abd   IMAGE GUIDED SINUS SURGERY  2015   INSERT / REPLACE / REMOVE PACEMAKER  12/06/2003   Medtronic impulse DTDR01   JOINT REPLACEMENT Right    knee   KYPHOPLASTY N/A 06/23/2017   Procedure: [id number];  Surgeon: Hessie Knows, MD;  Location: [facility] ORS;  Service: Orthopedics;  Laterality: N/A;   LEEP  1999   ORIF HIP FRACTURE Right 02/2016   PACEMAKER GENERATOR CHANGE N/A 09/18/2012   Procedure: PACEMAKER GENERATOR CHANGE;  Surgeon: Deboraha Sprang, MD;  Location: [facility] CATH LAB;  Service: Cardiovascular;  Laterality: N/A;   TONSILLECTOMY     TOTAL KNEE ARTHROPLASTY     TOTAL KNEE REVISION Right 09/23/2016   Procedure: TOTAL KNEE REVISION-POLYETHYLENE EXCHANGE;  Surgeon: Hessie Knows, MD;  Location: [facility] ORS;  Service: Orthopedics;  Laterality: Right;   VAGINAL HYSTERECTOMY      Current Outpatient Medications  Medication Sig Dispense Refill   acetaminophen (TYLENOL) 500 MG tablet Take 500 mg by mouth every 8 (eight) hours as needed for mild pain or headache.  ELIQUIS 2.5 MG TABS tablet TAKE 1 TABLET TWICE DAILY 180 tablet 1   fluticasone (FLONASE) 50 MCG/ACT nasal spray Place 2 sprays into both nostrils daily as needed for allergies or rhinitis.      furosemide (LASIX) 40 MG tablet Take 1 tablet (40 mg total) by mouth as needed. 90 tablet 3   ibandronate (BONIVA) 150 MG tablet Take 150 mg by mouth every 30 (thirty) days. Take in the morning with a full glass of water, on an empty stomach, and do not take anything else by mouth or lie down for the next 30 min.     Multiple Vitamins-Minerals (SENIOR MULTIVITAMIN PLUS PO) Take by mouth.     nadolol (CORGARD) 20 MG tablet Take 3 tablets (60 mg total) by mouth 2 (two) times daily. 180 tablet 1   potassium chloride SA (KLOR-CON) 20 MEQ tablet TAKE 1/2 TABLET TWICE DAILY (EVERY MORNING AND EVERY EVENING) 90 tablet 3   No current facility-administered  medications for this visit.    Allergies  Allergen Reactions   Latex Other (See Comments)    blisters   Other Other (See Comments)    Blisters/ paper tape is Market researcher paper tape is Market researcher paper tape is Clinical research associate paper tape is Ok   Adult nurse (See Comments)    Blisters/ paper tape is Market researcher paper tape is Ok   Xarelto [Rivaroxaban] Other (See Comments)    Gums bleeding and too much bruising   Nickel Rash and Other (See Comments)    Review of Systems negative except from HPI and PMH BP (!) 148/98 (BP Location: Left Arm, Patient Position: Sitting, Cuff Size: Normal)   Pulse 83   Ht 5\' 7"  (1.702 m)   Wt 123 lb (55.8 kg)   SpO2 94%   BMI 19.26 kg/m   Well developed and nourished in no acute distress HENT normal Neck supple with JVP-  flat   Clear Regular rate and rhythm, no murmurs or gallops Abd-soft with active BS No Clubbing cyanosis edema Skin-warm and dry A & Oriented  Grossly normal sensory and motor function    ECG atrial paced at 77 with frequent PACs with intrinsic conduction and rare ventricular pacing  Assessment and  Plan  Atrial arrhythmia/Fib  Sinus node dysfunction  Hypertension  Syncope  Pacemaker  Medtronic  Dual chamber   Elevated pacing thresholds A Greater    HFpEF>> LV dysfunction resolved  Orthostatic hypotension  1 AVB     Blood pressure remains elevated.  However, given her history of orthostatic hypotension we will allow it to rise to the 140-160 range.  No interval atrial fibrillation.  Thus cannot assess the benefits of the higher dose of nadolol in terms of rate control.  Lengthy discussion regarding options including continuing rate control, antiarrhythmic therapy with either dofetilide and/or amiodarone.  In this regard we reviewed potential side effects.  We also discussed the role of catheter ablation.  In an octogenarian, suspect there is less enthusiasm and doing it without prior drug exposure; I will  review this with Dr. Daune Perch.  For now, she is elected to proceed with nadolol at the higher dose.  Next drug rate option I think would include low-dose digoxin as an adjunct.

## 2020-08-07 NOTE — Patient Instructions (Signed)
Medication Instructions:  Your physician recommends that you continue on your current medications as directed. Please refer to the Current Medication list given to you today.  *If you need a refill on your cardiac medications before your next appointment, please call your pharmacy*   Lab Work: None ordered.  If you have labs (blood work) drawn today and your tests are completely normal, you will receive your results only by: [place] (if you have MyChart) OR A paper copy in the mail If you have any lab test that is abnormal or we need to change your treatment, we will call you to review the results.   Testing/Procedures: None ordered.    Follow-Up: At [facility], you and your health needs are our priority.  As part of our continuing mission to provide you with exceptional heart care, we have created designated Provider Care Teams.  These Care Teams include your primary Cardiologist (physician) and Advanced Practice Providers (APPs -  Physician Assistants and Nurse Practitioners) who all work together to provide you with the care you need, when you need it.  We recommend signing up for the patient portal called "MyChart".  Sign up information is provided on this After Visit Summary.  MyChart is used to connect with patients for Virtual Visits (Telemedicine).  Patients are able to view lab/test results, encounter notes, upcoming appointments, etc.  Non-urgent messages can be sent to your provider as well.   To learn more about what you can do with MyChart, go to [email or web address].    Your next appointment:   4 month(s)  The format for your next appointment:   In Person  Provider:   [name], MD

## 2020-08-15 DIAGNOSIS — M6281 Muscle weakness (generalized): Secondary | ICD-10-CM | POA: Diagnosis not present

## 2020-08-15 DIAGNOSIS — R262 Difficulty in walking, not elsewhere classified: Secondary | ICD-10-CM | POA: Diagnosis not present

## 2020-08-19 DIAGNOSIS — M6281 Muscle weakness (generalized): Secondary | ICD-10-CM | POA: Diagnosis not present

## 2020-08-19 DIAGNOSIS — R262 Difficulty in walking, not elsewhere classified: Secondary | ICD-10-CM | POA: Diagnosis not present

## 2020-08-26 DIAGNOSIS — M6281 Muscle weakness (generalized): Secondary | ICD-10-CM | POA: Diagnosis not present

## 2020-08-26 DIAGNOSIS — R262 Difficulty in walking, not elsewhere classified: Secondary | ICD-10-CM | POA: Diagnosis not present

## 2020-09-02 ENCOUNTER — Encounter: Payer: Medicare HMO | Admitting: Internal Medicine

## 2020-09-04 DIAGNOSIS — M6281 Muscle weakness (generalized): Secondary | ICD-10-CM | POA: Diagnosis not present

## 2020-09-04 DIAGNOSIS — R262 Difficulty in walking, not elsewhere classified: Secondary | ICD-10-CM | POA: Diagnosis not present

## 2020-09-12 DIAGNOSIS — R262 Difficulty in walking, not elsewhere classified: Secondary | ICD-10-CM | POA: Diagnosis not present

## 2020-09-12 DIAGNOSIS — M6281 Muscle weakness (generalized): Secondary | ICD-10-CM | POA: Diagnosis not present

## 2020-09-17 DIAGNOSIS — H2513 Age-related nuclear cataract, bilateral: Secondary | ICD-10-CM | POA: Diagnosis not present

## 2020-10-09 DIAGNOSIS — J208 Acute bronchitis due to other specified organisms: Secondary | ICD-10-CM | POA: Diagnosis not present

## 2020-10-09 DIAGNOSIS — J209 Acute bronchitis, unspecified: Secondary | ICD-10-CM | POA: Diagnosis not present

## 2020-10-09 DIAGNOSIS — I5022 Chronic systolic (congestive) heart failure: Secondary | ICD-10-CM | POA: Diagnosis not present

## 2020-10-09 DIAGNOSIS — I480 Paroxysmal atrial fibrillation: Secondary | ICD-10-CM | POA: Diagnosis not present

## 2020-10-09 DIAGNOSIS — U071 COVID-19: Secondary | ICD-10-CM | POA: Diagnosis not present

## 2020-10-16 ENCOUNTER — Ambulatory Visit (INDEPENDENT_AMBULATORY_CARE_PROVIDER_SITE_OTHER): Payer: Medicare HMO

## 2020-10-16 DIAGNOSIS — I4891 Unspecified atrial fibrillation: Secondary | ICD-10-CM | POA: Diagnosis not present

## 2020-10-16 LAB — CUP PACEART REMOTE DEVICE CHECK
Battery Impedance: 1685 Ohm
Battery Remaining Longevity: 41 mo
Battery Voltage: 2.76 V
Brady Statistic AP VP Percent: 15 %
Brady Statistic AP VS Percent: 50 %
Brady Statistic AS VP Percent: 2 %
Brady Statistic AS VS Percent: 32 %
Date Time Interrogation Session: 20220929105840
Implantable Lead Implant Date: 20051130
Implantable Lead Implant Date: 20051130
Implantable Lead Location: 753859
Implantable Lead Location: 753860
Implantable Lead Model: 5076
Implantable Pulse Generator Implant Date: 20140901
Lead Channel Impedance Value: 581 Ohm
Lead Channel Impedance Value: 640 Ohm
Lead Channel Pacing Threshold Amplitude: 0.5 V
Lead Channel Pacing Threshold Amplitude: 0.875 V
Lead Channel Pacing Threshold Pulse Width: 0.4 ms
Lead Channel Pacing Threshold Pulse Width: 0.4 ms
Lead Channel Setting Pacing Amplitude: 2 V
Lead Channel Setting Pacing Amplitude: 2.5 V
Lead Channel Setting Pacing Pulse Width: 0.46 ms
Lead Channel Setting Sensing Sensitivity: 5.6 mV

## 2020-10-24 NOTE — Progress Notes (Signed)
Remote pacemaker transmission.   

## 2020-11-06 DIAGNOSIS — E039 Hypothyroidism, unspecified: Secondary | ICD-10-CM | POA: Diagnosis not present

## 2020-11-06 DIAGNOSIS — I480 Paroxysmal atrial fibrillation: Secondary | ICD-10-CM | POA: Diagnosis not present

## 2020-11-06 DIAGNOSIS — I779 Disorder of arteries and arterioles, unspecified: Secondary | ICD-10-CM | POA: Diagnosis not present

## 2020-11-13 DIAGNOSIS — I779 Disorder of arteries and arterioles, unspecified: Secondary | ICD-10-CM | POA: Diagnosis not present

## 2020-11-13 DIAGNOSIS — Z0000 Encounter for general adult medical examination without abnormal findings: Secondary | ICD-10-CM | POA: Diagnosis not present

## 2020-11-13 DIAGNOSIS — I5022 Chronic systolic (congestive) heart failure: Secondary | ICD-10-CM | POA: Diagnosis not present

## 2020-11-13 DIAGNOSIS — Z23 Encounter for immunization: Secondary | ICD-10-CM | POA: Diagnosis not present

## 2020-11-13 DIAGNOSIS — I480 Paroxysmal atrial fibrillation: Secondary | ICD-10-CM | POA: Diagnosis not present

## 2020-11-13 DIAGNOSIS — E039 Hypothyroidism, unspecified: Secondary | ICD-10-CM | POA: Diagnosis not present

## 2020-11-19 ENCOUNTER — Other Ambulatory Visit: Payer: Self-pay | Admitting: Internal Medicine

## 2020-11-20 ENCOUNTER — Other Ambulatory Visit: Payer: Self-pay

## 2020-11-20 MED ORDER — NADOLOL 20 MG PO TABS: 60.0000 mg | ORAL_TABLET | Freq: Two times a day (BID) | ORAL | 2 refills | Status: DC

## 2020-12-16 ENCOUNTER — Ambulatory Visit (INDEPENDENT_AMBULATORY_CARE_PROVIDER_SITE_OTHER): Payer: Medicare HMO | Admitting: Internal Medicine

## 2020-12-16 ENCOUNTER — Encounter: Payer: Self-pay | Admitting: Internal Medicine

## 2020-12-16 ENCOUNTER — Other Ambulatory Visit: Payer: Self-pay

## 2020-12-16 VITALS — BP 130/84 | HR 81 | Ht 67.0 in | Wt 123.0 lb

## 2020-12-16 DIAGNOSIS — I495 Sick sinus syndrome: Secondary | ICD-10-CM

## 2020-12-16 DIAGNOSIS — I5032 Chronic diastolic (congestive) heart failure: Secondary | ICD-10-CM | POA: Diagnosis not present

## 2020-12-16 DIAGNOSIS — I480 Paroxysmal atrial fibrillation: Secondary | ICD-10-CM | POA: Diagnosis not present

## 2020-12-16 DIAGNOSIS — I10 Essential (primary) hypertension: Secondary | ICD-10-CM

## 2020-12-16 DIAGNOSIS — Z950 Presence of cardiac pacemaker: Secondary | ICD-10-CM | POA: Diagnosis not present

## 2020-12-16 LAB — PACEMAKER DEVICE OBSERVATION

## 2020-12-16 NOTE — Progress Notes (Signed)
Patient Care Team: Kirk Ruths, MD as PCP - General (Unknown Physician Specialty)   HPI  Alejandra Wiley is a 87 y.o. female seen in followup for palpitations associated with atrial arrhythmias as well as bradycardia requiring backup bradycardia pacing. She previously took flecainide.  Resulted in prolonged AV nodal conduction and ventricular pacing.  Deterioration of LV function; flecainide discontinued with subsequent normalization of LV function.    1/20 Hospitalized with stroke- R sided weakness.  INR subtherapeutic 1.52.  >>imaging  CT  No acute stroke.  Carotids neg.  Coumadin d/c and apixoban started ( 2.5 mg bid -wt 55 kg;age->82)  4/22 hospitalized with weakness and found to have Afib RVR>>> prompting increase of her nadolol.  Further uptitration to 60 bid, still with some rapid rates ( (See Below)    The patient denies chest pain, nocturnal dyspnea, orthopnea or peripheral edema.  There have been no palpitations, lightheadedness or syncope.  Complains of s dyspnea on exertion which is relatively predictable i.e. not variable as is her atrial fibrillation  Overall she feels much better on the higher dose of nadolol in nondescript ways.  She is excited that she is getting married in just a few weeks.  Her children will be coming.    Date VP%  3/14 1  3/*16 95  10/17 95  10/18 92-DDD-DDI  6/19 68  10/19 84  4/20  3.3  7/21 9      DATE TEST EF Vp %    /13 Echo  55-65%    8/19 Echo  50-55% 84%  1/20 Echo   30-35%   11/21 Echo  55-60   5/22 Echo  55-55%      Date Cr K Hgb TSH  8/18 0.67  12.2   9/18 0.64  10   1/20 0.95  11.8   6/20 0.9  11.7   5/22 0.77  11.3   10/22 0.9 4.4  4.84          Thromboembolic risk factors ( age  -2, HTN-1, TIA/CVA-2, DM-1, CHF-1, Gender-1) for a CHADSVASc Score of >=8    Past Medical History:  Diagnosis Date   Bradycardia    Cancer (HCC)    vaginal intraepithelial neoplasia   CHF (congestive heart failure)  ([place])    Hypertension    Pacemaker 12/06/2003   Medtronic Impulse DTDR01   Pacemaker 2015   PAF (paroxysmal atrial fibrillation) (HCC)    a. on Coumadin and flecainide; b. CHADS2VASc 3 (HTN, age x 2, female)   Sick sinus syndrome ([place])    Sinoatrial node dysfunction ([place])    Stroke ([place])    Vaginal intraepithelial neoplasia     Past Surgical History:  Procedure Laterality Date   APPENDECTOMY     HERNIA REPAIR     lower abd   IMAGE GUIDED SINUS SURGERY  2015   INSERT / REPLACE / REMOVE PACEMAKER  12/06/2003   Medtronic impulse DTDR01   JOINT REPLACEMENT Right    knee   KYPHOPLASTY N/A 06/23/2017   Procedure: [id number];  Surgeon: Hessie Knows, MD;  Location: [facility] ORS;  Service: Orthopedics;  Laterality: N/A;   LEEP  1999   ORIF HIP FRACTURE Right 02/2016   PACEMAKER GENERATOR CHANGE N/A 09/18/2012   Procedure: PACEMAKER GENERATOR CHANGE;  Surgeon: Deboraha Sprang, MD;  Location[facility] CATH LAB;  Service: Cardiovascular;  Laterality: N/A;   TONSILLECTOMY     TOTAL KNEE ARTHROPLASTY  TOTAL KNEE REVISION Right 09/23/2016   Procedure: TOTAL KNEE REVISION-POLYETHYLENE EXCHANGE;  Surgeon: Hessie Knows, MD;  Location: [facility] ORS;  Service: Orthopedics;  Laterality: Right;   VAGINAL HYSTERECTOMY      Current Outpatient Medications  Medication Sig Dispense Refill   acetaminophen (TYLENOL) 500 MG tablet Take 500 mg by mouth every 8 (eight) hours as needed for mild pain or headache.     ELIQUIS 2.5 MG TABS tablet TAKE 1 TABLET TWICE DAILY 180 tablet 1   fluticasone (FLONASE) 50 MCG/ACT nasal spray Place 2 sprays into both nostrils daily as needed for allergies or rhinitis.      ibandronate (BONIVA) 150 MG tablet Take 150 mg by mouth every 30 (thirty) days. Take in the morning with a full glass of water, on an empty stomach, and do not take anything else by mouth or lie down for the next 30 min.     Multiple Vitamins-Minerals (SENIOR MULTIVITAMIN PLUS PO) Take by mouth.     Multiple  Vitamins-Minerals (ZINC PO) Take by mouth.     nadolol (CORGARD) 20 MG tablet Take 3 tablets (60 mg total) by mouth 2 (two) times daily. 540 tablet 2   potassium chloride SA (KLOR-CON) 20 MEQ tablet TAKE 1/2 TABLET TWICE DAILY (EVERY MORNING AND EVERY EVENING) 90 tablet 3   furosemide (LASIX) 40 MG tablet Take 1 tablet (40 mg total) by mouth as needed. (Patient not taking: Reported on 08/07/2020) 90 tablet 3   No current facility-administered medications for this visit.    Allergies  Allergen Reactions   Latex Other (See Comments)    blisters   Other Other (See Comments)    Blisters/ paper tape is Market researcher paper tape is Market researcher paper tape is Clinical research associate paper tape is Ok   Adult nurse (See Comments)    Blisters/ paper tape is Market researcher paper tape is Ok   Xarelto [Rivaroxaban] Other (See Comments)    Gums bleeding and too much bruising   Nickel Rash and Other (See Comments)    Review of Systems negative except from HPI and PMH BP 130/84 (BP Location: Left Arm, Patient Position: Sitting, Cuff Size: Normal)   Pulse 81   Ht 5\' 7"  (1.702 m)   Wt 123 lb (55.8 kg)   SpO2 94%   BMI 19.26 kg/m  Well developed and well nourished in no acute distress HENT normal Neck supple with JVP-flat Clear Device pocket well healed; without hematoma or erythema.  There is no tethering  Regular rate and rhythm, no  gallop No  murmur Abd-soft with active BS No Clubbing cyanosis  edema Skin-warm and dry A & Oriented  Grossly normal sensory and motor function  ECG sinus with Freq PACs   Assessment and  Plan  Atrial arrhythmia/Fib (5.9%) with some RVR   Sinus node dysfunction  Hypertension  Syncope  Pacemaker  Medtronic  Dual chamber   Elevated pacing thresholds A Greater    HFpEF>> LV dysfunction resolved  Orthostatic hypotension  1 AVB   Much better on nadolol 60 bid, will continue   BP better but not significant impact on her HR with afib,  will add dig but delay  until after her wedding 1/14   Unaware of her interval AFib, Chronic dyspnea without variability-- will also consider SGLT-2   Orthostasis quiescent   No fluid continue furosemide prn

## 2020-12-16 NOTE — Patient Instructions (Signed)
Medication Instructions:  - Your physician recommends that you continue on your current medications as directed. Please refer to the Current Medication list given to you today.  *If you need a refill on your cardiac medications before your next appointment, please call your pharmacy*   Lab Work: - none ordered  If you have labs (blood work) drawn today and your tests are completely normal, you will receive your results only by: [place] (if you have MyChart) OR A paper copy in the mail If you have any lab test that is abnormal or we need to change your treatment, we will call you to review the results.   Testing/Procedures: - none ordered   Follow-Up: At [facility], you and your health needs are our priority.  As part of our continuing mission to provide you with exceptional heart care, we have created designated Provider Care Teams.  These Care Teams include your primary Cardiologist (physician) and Advanced Practice Providers (APPs -  Physician Assistants and Nurse Practitioners) who all work together to provide you with the care you need, when you need it.  We recommend signing up for the patient portal called "MyChart".  Sign up information is provided on this After Visit Summary.  MyChart is used to connect with patients for Virtual Visits (Telemedicine).  Patients are able to view lab/test results, encounter notes, upcoming appointments, etc.  Non-urgent messages can be sent to your provider as well.   To learn more about what you can do with MyChart, go to [email or web address].    Your next appointment:   3-4 month(s)  The format for your next appointment:   In Person  Provider:   [name], MD    Other Instructions N/a

## 2020-12-19 LAB — CUP PACEART INCLINIC DEVICE CHECK
Battery Impedance: 1741 Ohm
Battery Remaining Longevity: 39 mo
Battery Voltage: 2.76 V
Brady Statistic AP VP Percent: 13 %
Brady Statistic AP VS Percent: 46 %
Brady Statistic AS VP Percent: 2 %
Brady Statistic AS VS Percent: 39 %
Date Time Interrogation Session: 20221129102600
Implantable Lead Implant Date: 20051130
Implantable Lead Implant Date: 20051130
Implantable Lead Location: 753859
Implantable Lead Location: 753860
Implantable Lead Model: 5076
Implantable Pulse Generator Implant Date: 20140901
Lead Channel Impedance Value: 498 Ohm
Lead Channel Impedance Value: 548 Ohm
Lead Channel Pacing Threshold Amplitude: 0.5 V
Lead Channel Pacing Threshold Amplitude: 0.75 V
Lead Channel Pacing Threshold Pulse Width: 0.4 ms
Lead Channel Pacing Threshold Pulse Width: 0.46 ms
Lead Channel Sensing Intrinsic Amplitude: 0.7 mV
Lead Channel Sensing Intrinsic Amplitude: 11.2 mV
Lead Channel Setting Pacing Amplitude: 2 V
Lead Channel Setting Pacing Amplitude: 2.5 V
Lead Channel Setting Pacing Pulse Width: 0.46 ms
Lead Channel Setting Sensing Sensitivity: 4 mV

## 2020-12-29 ENCOUNTER — Encounter: Payer: Self-pay | Admitting: Physician Assistant

## 2020-12-29 ENCOUNTER — Ambulatory Visit: Payer: Medicare HMO | Admitting: Physician Assistant

## 2020-12-29 ENCOUNTER — Emergency Department: Payer: Medicare HMO

## 2020-12-29 ENCOUNTER — Telehealth: Payer: Self-pay | Admitting: Internal Medicine

## 2020-12-29 ENCOUNTER — Inpatient Hospital Stay
Admission: EM | Admit: 2020-12-29 | Discharge: 2020-12-31 | DRG: 308 | Disposition: A | Discharge status: Home Health | Payer: Medicare HMO | Source: Ambulatory Visit | Attending: Internal Medicine | Admitting: Internal Medicine

## 2020-12-29 ENCOUNTER — Other Ambulatory Visit: Payer: Self-pay

## 2020-12-29 VITALS — BP 138/80 | HR 118 | Ht 67.0 in | Wt 125.4 lb

## 2020-12-29 DIAGNOSIS — I4819 Other persistent atrial fibrillation: Secondary | ICD-10-CM | POA: Diagnosis not present

## 2020-12-29 DIAGNOSIS — I4891 Unspecified atrial fibrillation: Secondary | ICD-10-CM

## 2020-12-29 DIAGNOSIS — I200 Unstable angina: Secondary | ICD-10-CM

## 2020-12-29 DIAGNOSIS — Z91040 Latex allergy status: Secondary | ICD-10-CM | POA: Diagnosis not present

## 2020-12-29 DIAGNOSIS — Z7901 Long term (current) use of anticoagulants: Secondary | ICD-10-CM

## 2020-12-29 DIAGNOSIS — I5021 Acute systolic (congestive) heart failure: Secondary | ICD-10-CM | POA: Diagnosis not present

## 2020-12-29 DIAGNOSIS — J9811 Atelectasis: Secondary | ICD-10-CM | POA: Diagnosis not present

## 2020-12-29 DIAGNOSIS — I495 Sick sinus syndrome: Secondary | ICD-10-CM

## 2020-12-29 DIAGNOSIS — I5033 Acute on chronic diastolic (congestive) heart failure: Secondary | ICD-10-CM | POA: Diagnosis not present

## 2020-12-29 DIAGNOSIS — Z833 Family history of diabetes mellitus: Secondary | ICD-10-CM | POA: Diagnosis not present

## 2020-12-29 DIAGNOSIS — Z79899 Other long term (current) drug therapy: Secondary | ICD-10-CM | POA: Diagnosis not present

## 2020-12-29 DIAGNOSIS — R54 Age-related physical debility: Secondary | ICD-10-CM | POA: Diagnosis not present

## 2020-12-29 DIAGNOSIS — Z96651 Presence of right artificial knee joint: Secondary | ICD-10-CM | POA: Diagnosis present

## 2020-12-29 DIAGNOSIS — I951 Orthostatic hypotension: Secondary | ICD-10-CM

## 2020-12-29 DIAGNOSIS — I5032 Chronic diastolic (congestive) heart failure: Secondary | ICD-10-CM | POA: Diagnosis not present

## 2020-12-29 DIAGNOSIS — Z7983 Long term (current) use of bisphosphonates: Secondary | ICD-10-CM

## 2020-12-29 DIAGNOSIS — I110 Hypertensive heart disease with heart failure: Secondary | ICD-10-CM | POA: Diagnosis not present

## 2020-12-29 DIAGNOSIS — I10 Essential (primary) hypertension: Secondary | ICD-10-CM | POA: Diagnosis present

## 2020-12-29 DIAGNOSIS — Z950 Presence of cardiac pacemaker: Secondary | ICD-10-CM | POA: Diagnosis not present

## 2020-12-29 DIAGNOSIS — Z91048 Other nonmedicinal substance allergy status: Secondary | ICD-10-CM | POA: Diagnosis not present

## 2020-12-29 DIAGNOSIS — I5043 Acute on chronic combined systolic (congestive) and diastolic (congestive) heart failure: Secondary | ICD-10-CM | POA: Diagnosis not present

## 2020-12-29 DIAGNOSIS — Z823 Family history of stroke: Secondary | ICD-10-CM | POA: Diagnosis not present

## 2020-12-29 DIAGNOSIS — R079 Chest pain, unspecified: Secondary | ICD-10-CM | POA: Diagnosis not present

## 2020-12-29 DIAGNOSIS — Z8616 Personal history of COVID-19: Secondary | ICD-10-CM

## 2020-12-29 DIAGNOSIS — Z66 Do not resuscitate: Secondary | ICD-10-CM | POA: Diagnosis not present

## 2020-12-29 DIAGNOSIS — I4892 Unspecified atrial flutter: Secondary | ICD-10-CM | POA: Diagnosis not present

## 2020-12-29 DIAGNOSIS — R0602 Shortness of breath: Secondary | ICD-10-CM | POA: Diagnosis not present

## 2020-12-29 DIAGNOSIS — R072 Precordial pain: Secondary | ICD-10-CM | POA: Diagnosis not present

## 2020-12-29 DIAGNOSIS — I5042 Chronic combined systolic (congestive) and diastolic (congestive) heart failure: Secondary | ICD-10-CM

## 2020-12-29 DIAGNOSIS — J449 Chronic obstructive pulmonary disease, unspecified: Secondary | ICD-10-CM | POA: Diagnosis not present

## 2020-12-29 DIAGNOSIS — R0609 Other forms of dyspnea: Secondary | ICD-10-CM | POA: Diagnosis not present

## 2020-12-29 DIAGNOSIS — Z8249 Family history of ischemic heart disease and other diseases of the circulatory system: Secondary | ICD-10-CM

## 2020-12-29 DIAGNOSIS — I517 Cardiomegaly: Secondary | ICD-10-CM | POA: Diagnosis not present

## 2020-12-29 DIAGNOSIS — I447 Left bundle-branch block, unspecified: Secondary | ICD-10-CM

## 2020-12-29 DIAGNOSIS — R0600 Dyspnea, unspecified: Secondary | ICD-10-CM

## 2020-12-29 DIAGNOSIS — R5383 Other fatigue: Secondary | ICD-10-CM

## 2020-12-29 LAB — BRAIN NATRIURETIC PEPTIDE: B Natriuretic Peptide: 1365.8 pg/mL — ABNORMAL HIGH (ref 0.0–100.0)

## 2020-12-29 LAB — TROPONIN I (HIGH SENSITIVITY)
Troponin I (High Sensitivity): 16 ng/L (ref <18)
Troponin I (High Sensitivity): 18 ng/L — ABNORMAL HIGH (ref <18)

## 2020-12-29 LAB — CBC
HCT: 37.9 % (ref 36.0–46.0)
Hemoglobin: 12.2 g/dL (ref 12.0–15.0)
MCH: 31.4 pg (ref 26.0–34.0)
MCHC: 32.2 g/dL (ref 30.0–36.0)
MCV: 97.4 fL (ref 80.0–100.0)
Platelets: 236 10*3/uL (ref 150–400)
RBC: 3.89 MIL/uL (ref 3.87–5.11)
RDW: 13.8 % (ref 11.5–15.5)
WBC: 7.9 10*3/uL (ref 4.0–10.5)
nRBC: 0 % (ref 0.0–0.2)

## 2020-12-29 LAB — BASIC METABOLIC PANEL
Anion gap: 8 (ref 5–15)
BUN: 30 mg/dL — ABNORMAL HIGH (ref 8–23)
CO2: 21 mmol/L — ABNORMAL LOW (ref 22–32)
Calcium: 9.4 mg/dL (ref 8.9–10.3)
Chloride: 106 mmol/L (ref 98–111)
Creatinine, Ser: 0.87 mg/dL (ref 0.44–1.00)
GFR, Estimated: >60 mL/min (ref >60)
Glucose, Bld: 131 mg/dL — ABNORMAL HIGH (ref 70–99)
Potassium: 4.7 mmol/L (ref 3.5–5.1)
Sodium: 135 mmol/L (ref 135–145)

## 2020-12-29 LAB — MAGNESIUM: Magnesium: 2.4 mg/dL (ref 1.7–2.4)

## 2020-12-29 MED ORDER — AMIODARONE HCL IN DEXTROSE 360-4.14 MG/200ML-% IV SOLN
60.0000 mg/h | INTRAVENOUS | Status: AC
  Administered 2020-12-29: 60 mg/h via INTRAVENOUS
  Filled 2020-12-29 (×2): qty 200

## 2020-12-29 MED ORDER — NADOLOL 40 MG PO TABS
60.0000 mg | ORAL_TABLET | Freq: Two times a day (BID) | ORAL | Status: DC
  Administered 2020-12-29 – 2020-12-31 (×4): 60 mg via ORAL
  Filled 2020-12-29 (×6): qty 1

## 2020-12-29 MED ORDER — AMIODARONE HCL IN DEXTROSE 360-4.14 MG/200ML-% IV SOLN
30.0000 mg/h | INTRAVENOUS | Status: DC
  Administered 2020-12-29 – 2020-12-30 (×2): 30 mg/h via INTRAVENOUS
  Filled 2020-12-29: qty 200

## 2020-12-29 MED ORDER — AMIODARONE LOAD VIA INFUSION
150.0000 mg | Freq: Once | INTRAVENOUS | Status: AC
  Administered 2020-12-29: 150 mg via INTRAVENOUS
  Filled 2020-12-29: qty 83.34

## 2020-12-29 MED ORDER — ACETAMINOPHEN 500 MG PO TABS
500.0000 mg | ORAL_TABLET | Freq: Three times a day (TID) | ORAL | Status: DC | PRN
  Administered 2020-12-29 – 2020-12-31 (×2): 500 mg via ORAL
  Filled 2020-12-29 (×2): qty 1

## 2020-12-29 MED ORDER — ONDANSETRON HCL 4 MG/2ML IJ SOLN: 4.0000 mg | Freq: Four times a day (QID) | INTRAMUSCULAR | Status: DC | PRN

## 2020-12-29 MED ORDER — APIXABAN 2.5 MG PO TABS
2.5000 mg | ORAL_TABLET | Freq: Two times a day (BID) | ORAL | Status: DC
  Administered 2020-12-29 – 2020-12-31 (×4): 2.5 mg via ORAL
  Filled 2020-12-29 (×6): qty 1

## 2020-12-29 MED ORDER — ADULT MULTIVITAMIN W/MINERALS CH
1.0000 | ORAL_TABLET | Freq: Every day | ORAL | Status: DC
  Administered 2020-12-31: 10:00:00 1 via ORAL
  Filled 2020-12-29: qty 1

## 2020-12-29 NOTE — ED Notes (Signed)
Patient given applesauce, peanut butter, saltines, and whole milk at this time

## 2020-12-29 NOTE — Addendum Note (Signed)
Addended by: Rise Mu on: 12/29/2020 01:11 PM   Modules accepted: Level of Service

## 2020-12-29 NOTE — Telephone Encounter (Signed)
I spoke with Sharyn Lull, CMA at [facility]. Sharyn Lull advised the patient used her pull cord at [facility] on Friday stating she wasn't feeling well and thought her heart may be out of rhythm.  Sharyn Lull did a follow up call with the patient this morning and the patient is still not feeling well and advised she thinks her heart is still out of rhythm.  I advised Sharyn Lull that I could offer that patient to be seen in the clinic this morning with Christell Faith, PA to evaluate her rhythm.  Sharyn Lull offered this to the patient and she is agreeable.   The patient has been scheduled to see Christell Faith, PA at 10:55 am.

## 2020-12-29 NOTE — Telephone Encounter (Signed)
CMA is calling from [facility] stating that patient feels like her heart is out of rhythm. States she is feeling tired and weak. HR on 12/10 was 98. Please call to discuss. BP 132/86

## 2020-12-29 NOTE — H&P (Signed)
History and Physical    Alejandra Wiley:[id number] DOB: Jan 23, 1932 DOA: 12/29/2020  PCP: Alejandra Ruths, MD   Patient coming from: Home  I have personally briefly reviewed patient's old medical records in [place]  Chief Complaint: Palpitations/chest pressure  HPI: Alejandra Wiley is a 87 y.o. female with medical history significant for paroxysmal atrial fibrillation, chronic combined systolic and diastolic dysfunction CHF with last known LVEF of 50%, hypertension, status post pacemaker insertion for sick sinus syndrome who presents to the ER for evaluation of a 3-day history of chest pressure associated with palpitations and shortness of breath. She states that it feels like her prior episodes of rapid atrial fibrillation. She has had nausea but denies any vomiting.  There is no radiation of her chest pressure.  She complains of feeling dizzy and lightheaded but denies having any abdominal pain, no fever, no chills, no cough, no urinary symptoms, no changes in her bowel habits, no lower extremity swelling, no focal deficit or blurred vision. Sodium 135, potassium 4.7, chloride 106, bicarb 21, glucose 131, BUN 30, creatinine 0.87, calcium 9.4, BNP 1365, troponin 16, white count 7.9, hemoglobin 12.2, hematocrit 37.9, MCV 97.4, RDW 13.8, platelet count 236 Chest x-ray reviewed by me shows stable cardiomegaly.  COPD with bibasilar atelectasis.  No focal consolidation or pleural effusion. Twelve-lead EKG reviewed by me shows atrial fibrillation with a rapid ventricular response and left bundle branch block.   ED Course: Patient is an 87 year old female who presents to the ER for evaluation of a 3-day history of palpitations, chest pressure and weakness.  She was found to be in rapid A. fib. Patient was started on amiodarone drip in the emergency room and will be admitted to the hospital for further evaluation.   Review of Systems: As per HPI otherwise all other systems  reviewed and negative.    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 on Coumadin and flecainide; b. CHADS2VASc 3 (HTN, age x 2, female)   Sick sinus syndrome ([place])    Sinoatrial node dysfunction ([place])    Stroke [facility])    Vaginal intraepithelial neoplasia     Past Surgical History:  Procedure Laterality Date   APPENDECTOMY     HERNIA REPAIR     lower abd   IMAGE GUIDED SINUS SURGERY  2015   INSERT / REPLACE / REMOVE PACEMAKER  12/06/2003   Medtronic impulse [id number]   JOINT REPLACEMENT Right    knee   KYPHOPLASTY N/A 06/23/2017   Procedure: [id number];  Surgeon: Hessie Knows, MD;  Location: [facility] ORS;  Service: Orthopedics;  Laterality: N/A;   LEEP  1999   ORIF HIP FRACTURE Right 02/2016   PACEMAKER GENERATOR CHANGE N/A 09/18/2012   Procedure: PACEMAKER GENERATOR CHANGE;  Surgeon: Deboraha Sprang, MD;  Location: [facility] C[facility] LAB;  Service: Cardiovascular;  Laterality: N/A;   TONSILLECTOMY     TOTAL KNEE ARTHROPLASTY     TOTAL KNEE REVISION Right 09/23/2016   Procedure: TOTAL KNEE REVISION-POLYETHYLENE EXCHANGE;  Surgeon: Hessie Knows, MD;  Location: [facility] ORS;  Service: Orthopedics;  Laterality: Right;   VAGINAL HYSTERECTOMY       reports that she has never smoked. She has never used smokeless  tobacco. She reports current alcohol use of about 1.0 standard drink per week. She reports that she does not use drugs.  Allergies  Allergen Reactions   Latex Other (See Comments)    blisters   Other Other (See Comments)    Blisters/ paper tape is Market researcher paper tape is Market researcher paper tape is Clinical research associate paper tape is Ok   Adult nurse (See Comments)    Blisters/ paper tape is Market researcher paper tape is Ok   Xarelto [Rivaroxaban] Other (See  Comments)    Gums bleeding and too much bruising   Nickel Rash and Other (See Comments)    Family History  Problem Relation Age of Onset   Stroke Mother    Heart disease Father    Diabetes Daughter    Diabetes Son       Prior to Admission medications   Medication Sig Start Date End Date Taking? Authorizing Provider  acetaminophen (TYLENOL) 500 MG tablet Take 500 mg by mouth every 8 (eight) hours as needed for mild pain or headache.    [provider]  ELIQUIS 2.5 MG TABS tablet TAKE 1 TABLET TWICE DAILY 07/18/20   Deboraha Sprang, MD  fluticasone [facility]) 50 MCG/ACT nasal spray Place 2 sprays into both nostrils daily as needed for allergies or rhinitis.     [provider]  furosemide (LASIX) 40 MG tablet Take 1 tablet (40 mg total) by mouth as needed. 05/29/20 12/29/20  Furth, Cadence H, PA-C  ibandronate (BONIVA) 150 MG tablet Take 150 mg by mouth every 30 (thirty) days. Take in the morning with a full glass of water, on an empty stomach, and do not take anything else by mouth or lie down for the next 30 min.    [provider]  Multiple Vitamins-Minerals (SENIOR MULTIVITAMIN PLUS PO) Take by mouth.    [provider]  Multiple Vitamins-Minerals (ZINC PO) Take by mouth.    [provider]  nadolol (CORGARD) 20 MG tablet Take 3 tablets (60 mg total) by mouth 2 (two) times daily. 11/20/20 01/19/21  Deboraha Sprang, MD  potassium chloride SA (KLOR-CON) 20 MEQ tablet TAKE 1/2 TABLET TWICE DAILY (EVERY MORNING AND EVERY EVENING) 07/18/20   Constance Haw, MD    Physical Exam: Vitals:   12/29/20 1330 12/29/20 1400 12/29/20 1430 12/29/20 1500  BP: (!) 151/115 (!) 134/106 (!) 121/94 135/87  Pulse: (!) 120 91 90 (!) 108  Resp: 20 (!) 21 (!) 21 20  Temp:      TempSrc:      SpO2: 96% 97% 97% 98%  Weight:      Height:         Vitals:   12/29/20 1330 12/29/20 1400 12/29/20 1430 12/29/20 1500  BP: (!) 151/115 (!) 134/106 (!) 121/94 135/87   Pulse: (!) 120 91 90 (!) 108  Resp: 20 (!) 21 (!) 21 20  Temp:      TempSrc:      SpO2: 96% 97% 97% 98%  Weight:      Height:          Constitutional: Alert and oriented x 3 . Not in any apparent distress HEENT:      Head: Normocephalic and atraumatic.         Eyes: PERLA, EOMI, Conjunctivae are normal. Sclera is non-icteric.       Mouth/Throat: Mucous membranes are moist.       Neck: Supple with no signs of meningismus. Cardiovascular:  Irregularly irregular, tachycardic. No murmurs, gallops, or rubs. 2+ symmetrical distal pulses are present . No JVD. No LE edema Respiratory: Respiratory effort normal .Lungs sounds clear bilaterally. No wheezes, crackles, or rhonchi.  Gastrointestinal: Soft, non tender, and non distended with positive bowel sounds.  Genitourinary: No CVA tenderness. Musculoskeletal: Nontender with normal range of motion in all extremities. No cyanosis, or erythema of extremities. Neurologic:  Face is symmetric. Moving all extremities. No gross focal neurologic deficits . Skin: Skin is warm, dry.  No rash or ulcers Psychiatric: Mood and affect are normal    Labs on Admission: I have personally reviewed following labs and imaging studies  CBC: Recent Labs  Lab 12/29/20 1237  WBC 7.9  HGB 12.2  HCT 37.9  MCV 97.4  PLT 993   Basic Metabolic Panel: Recent Labs  Lab 12/29/20 1237  NA 135  K 4.7  CL 106  CO2 21*  GLUCOSE 131*  BUN 30*  CREATININE 0.87  CALCIUM 9.4  MG 2.4   GFR: Estimated Creatinine Clearance: 39.9 mL/min (by C-G formula based on SCr of 0.87 mg/dL). Liver Function Tests: No results for input(s): AST, ALT, ALKPHOS, BILITOT, PROT, ALBUMIN in the last 168 hours. No results for input(s): LIPASE, AMYLASE in the last 168 hours. No results for input(s): AMMONIA in the last 168 hours. Coagulation Profile: No results for input(s): INR, PROTIME in the last 168 hours. Cardiac Enzymes: No results for input(s): CKTOTAL, CKMB, CKMBINDEX,  TROPONINI in the last 168 hours. BNP (last 3 results) No results for input(s): PROBNP in the last 8760 hours. HbA1C: No results for input(s): HGBA1C in the last 72 hours. CBG: No results for input(s): GLUCAP in the last 168 hours. Lipid Profile: No results for input(s): CHOL, HDL, LDLCALC, TRIG, CHOLHDL, LDLDIRECT in the last 72 hours. Thyroid Function Tests: No results for input(s): TSH, T4TOTAL, FREET4, T3FREE, THYROIDAB in the last 72 hours. Anemia Panel: No results for input(s): VITAMINB12, FOLATE, FERRITIN, TIBC, IRON, RETICCTPCT in the last 72 hours. Urine analysis:    Component Value Date/Time   COLORURINE STRAW (A) 05/17/2020 1247   APPEARANCEUR CLEAR (A) 05/17/2020 1247   APPEARANCEUR Clear 09/16/2012 2023   LABSPEC 1.006 05/17/2020 1247   LABSPEC 1.016 09/16/2012 2023   PHURINE 5.0 05/17/2020 1247   GLUCOSEU NEGATIVE 05/17/2020 1247   GLUCOSEU Negative 09/16/2012 2023   HGBUR MODERATE (A) 05/17/2020 1247   BILIRUBINUR NEGATIVE 05/17/2020 1247   BILIRUBINUR Negative 09/16/2012 2023   KETONESUR NEGATIVE 05/17/2020 1247   PROTEINUR NEGATIVE 05/17/2020 1247   NITRITE NEGATIVE 05/17/2020 1247   LEUKOCYTESUR NEGATIVE 05/17/2020 1247   LEUKOCYTESUR 1+ 09/16/2012 2023    Radiological Exams on Admission: DG Chest Port 1 View  Result Date: 12/29/2020 CLINICAL DATA:  Chest pain EXAM: PORTABLE CHEST 1 VIEW COMPARISON:  Chest radiograph dated May 17, 2020 FINDINGS: The heart is mildly enlarged. Pacemaker leads terminating in the right atrium and right ventricle. Hyperinflated lungs. Bibasilar atelectasis. No acute osseous abnormality. IMPRESSION: 1.  Stable cardiomegaly. 2. COPD with bibasilar atelectasis. No focal consolidation or large pleural effusion. Electronically Signed   By: Keane Police D.O.   On: 12/29/2020 13:15     Assessment/Plan Principal Problem:   Atrial fibrillation with rapid ventricular response (HCC) Active Problems:   Chronic diastolic heart failure  ([place])   Hypertension     Patient is an 87 year old female admitted to the hospital for A. fib with rapid ventricular rate.   Atrial fibrillation with rapid ventricular rate Symptomatic Started on  amiodarone drip per cardiology recommendation Continue nadolol for rate control Continue Eliquis as primary prophylaxis for an acute stroke    Chronic diastolic dysfunction CHF Not acutely exacerbated Continue low-sodium diet  DVT prophylaxis: Apixaban Code Status: DO NOT RESUSCITATE Family Communication: Greater than 50% of time was spent discussing patient's condition and plan of care with her at the bedside.  All questions and concerns have been addressed.  She verbalizes understanding and agrees with the plan.  CODE STATUS was discussed and she is a DNR.  She lists her daughter as her healthcare power of attorney. Disposition Plan: Back to previous home environment Consults called: Cardiology  Status:At the time of admission, it appears that the appropriate admission status for this patient is inpatient. This is judged to be reasonable and necessary to provide the required intensity of service to ensure the patient's safety given the presenting symptoms, physical exam findings, and initial radiographic and laboratory data in the context of their comorbid conditions. Patient requires inpatient status due to high intensity of service, high risk for further deterioration and high frequency of surveillance required.     Collier Bullock MD [facility]     12/29/2020, 3:33 PM

## 2020-12-29 NOTE — ED Provider Notes (Signed)
[facility] Emergency Department Provider Note  ____________________________________________   Event Date/Time   First MD Initiated Contact with Patient 12/29/20 1232     (approximate)  I have reviewed the triage vital signs and the nursing notes.   HISTORY  Chief Complaint Chest Pain    HPI Alejandra Wiley is a 87 y.o. female with extensive past medical history including paroxysmal A. fib, hypertension, CHF, here with shortness of breath and palpitations.  Per report, the patient states that over the last several days, she has felt generally fatigued.  She has felt intermittent palpitations as well as chest pressure.  She had a concert at the facility where she lives so she was concerned and nervous about that, thought it was related.  She took an extra nadolol prior to this.  She went to see her cardiologist today and was noted to be in A. fib RVR, sent in for further evaluation.  She has an extensive history of paroxysmal A. Fib.  She states she is been taking her medications as prescribed.  Denies any recent fevers or chills.  No missed medications.  She currently feels tired but has no chest pain.    Past Medical History:  Diagnosis Date   Bradycardia    Cancer ([place])    vaginal intraepithelial neoplasia   CHF (congestive heart failure) ([place])    Hypertension    Pacemaker 12/06/2003   Medtronic Impulse DTDR01   Pacemaker 2015   PAF (paroxysmal atrial fibrillation) (HCC)    a. on Coumadin and flecainide; b. CHADS2VASc 3 (HTN, age x 2, female)   Sick sinus syndrome ([place])    Sinoatrial node dysfunction ([place])    Stroke [facility])    Vaginal intraepithelial neoplasia     Patient Active Problem List   Diagnosis Date Noted   Atrial fibrillation with rapid ventricular response ([place]) 05/19/2020   Atrial fibrillation with RVR ([place]) 05/17/2020   Generalized weakness 71/24/5809   Chronic systolic (congestive) heart failure ([place]) 03/31/2018   Arm weakness  02/09/2018   UTI (urinary tract infection) 11/28/2017   Protein calorie malnutrition ([place]) 10/10/2017   Falls 10/10/2017   Weakness 10/10/2017   Muscle atrophy 10/10/2017   Sinus node dysfunction (HCC) 10/10/2017   Syncope and collapse 09/12/2017   Pyelonephritis 08/26/2017   Chronic diastolic heart failure ([place]) 07/19/2017   Hypertension 07/19/2017   Painful total knee replacement ([place]) 09/23/2016   Fungal sinusitis 11/08/2013   ATRIAL FIBRILLATION 06/13/2008   PACEMAKER-Medtronic 06/13/2008    Past Surgical History:  Procedure Laterality Date   APPENDECTOMY     HERNIA REPAIR     lower abd   IMAGE GUIDED SINUS SURGERY  2015   INSERT / REPLACE / REMOVE PACEMAKER  12/06/2003   Medtronic impulse DTDR01   JOINT REPLACEMENT Right    knee   KYPHOPLASTY N/A 06/23/2017   Procedure: [id number];  Surgeon: Hessie Knows, MD;  Location: [facility] ORS;  Service: Orthopedics;  Laterality: N/A;   LEEP  1999   ORIF HIP FRACTURE Right 02/2016   PACEMAKER GENERATOR CHANGE N/A 09/18/2012   Procedure: PACEMAKER GENERATOR CHANGE;  Surgeon: Deboraha Sprang, MD;  Location: [facility] CATH LAB;  Service: Cardiovascular;  Laterality: N/A;   TONSILLECTOMY     TOTAL KNEE ARTHROPLASTY     TOTAL KNEE REVISION Right 09/23/2016   Procedure: TOTAL KNEE REVISION-POLYETHYLENE EXCHANGE;  Surgeon: Hessie Knows, MD;  Location: [facility] ORS;  Service: Orthopedics;  Laterality: Right;   VAGINAL HYSTERECTOMY  Prior to Admission medications   Medication Sig Start Date End Date Taking? Authorizing Provider  acetaminophen (TYLENOL) 500 MG tablet Take 500 mg by mouth every 8 (eight) hours as needed for mild pain or headache.   Yes [provider]  ELIQUIS 2.5 MG TABS tablet TAKE 1 TABLET TWICE DAILY 07/18/20  Yes Deboraha Sprang, MD  fluticasone [facility]) 50 MCG/ACT nasal spray Place 2 sprays into both nostrils daily as needed for allergies or rhinitis.    Yes [provider]  ibandronate (BONIVA) 150 MG tablet  Take 150 mg by mouth every 30 (thirty) days. Take in the morning with a full glass of water, on an empty stomach, and do not take anything else by mouth or lie down for the next 30 min.   Yes [provider]  Multiple Vitamins-Minerals (SENIOR MULTIVITAMIN PLUS PO) Take 1 tablet by mouth daily.   Yes [provider]  Multiple Vitamins-Minerals (ZINC PO) Take 1 tablet by mouth daily.   Yes [provider]  nadolol (CORGARD) 20 MG tablet Take 3 tablets (60 mg total) by mouth 2 (two) times daily. 11/20/20 01/19/21 Yes Deboraha Sprang, MD  potassium chloride SA (KLOR-CON) 20 MEQ tablet TAKE 1/2 TABLET TWICE DAILY (EVERY MORNING AND EVERY EVENING) Patient taking differently: Take 10 mEq by mouth daily as needed (leg cramps). 07/18/20  Yes Camnitz, Will Hassell Done, MD  furosemide (LASIX) 40 MG tablet Take 1 tablet (40 mg total) by mouth as needed. Patient not taking: Reported on 12/29/2020 05/29/20 12/29/20  Kathlen Mody, Cadence H, PA-C    Allergies Latex, Other, Tape, Xarelto [rivaroxaban], and Nickel  Family History  Problem Relation Age of Onset   Stroke Mother    Heart disease Father    Diabetes Daughter    Diabetes Son     Social History Social History   Tobacco Use   Smoking status: Never   Smokeless tobacco: Never  Vaping Use   Vaping Use: Never used  Substance Use Topics   Alcohol use: Yes    Alcohol/week: 1.0 standard drink    Types: 1 Glasses of wine per week    Comment: occasionally   Drug use: No    Review of Systems  Review of Systems  Constitutional:  Negative for fatigue and fever.  HENT:  Negative for congestion and sore throat.   Eyes:  Negative for visual disturbance.  Respiratory:  Positive for shortness of breath. Negative for cough.   Cardiovascular:  Positive for chest pain and palpitations.  Gastrointestinal:  Negative for abdominal pain, diarrhea, nausea and vomiting.  Genitourinary:  Negative for flank pain.  Musculoskeletal:  Negative for  back pain and neck pain.  Skin:  Negative for rash and wound.  Neurological:  Positive for weakness.  All other systems reviewed and are negative.   ____________________________________________  PHYSICAL EXAM:      VITAL SIGNS: ED Triage Vitals  Enc Vitals Group     BP 12/29/20 1226 (!) 133/92     Pulse Rate 12/29/20 1226 (!) 103     Resp 12/29/20 1226 16     Temp 12/29/20 1226 98 F (36.7 C)     Temp Source 12/29/20 1226 Oral     SpO2 12/29/20 1226 94 %     Weight 12/29/20 1227 120 lb (54.4 kg)     Height 12/29/20 1227 5\' 6"  (1.676 m)     Head Circumference --      Peak Flow --  Pain Score 12/29/20 1227 2     Pain Loc --      Pain Edu? --      Excl. in [place]? --      Physical Exam Vitals and nursing note reviewed.  Constitutional:      General: She is not in acute distress.    Appearance: She is well-developed.  HENT:     Head: Normocephalic and atraumatic.  Eyes:     Conjunctiva/sclera: Conjunctivae normal.  Cardiovascular:     Rate and Rhythm: Tachycardia present. Rhythm irregularly irregular.     Heart sounds: Normal heart sounds. No murmur heard.   No friction rub.  Pulmonary:     Effort: Pulmonary effort is normal. No respiratory distress.     Breath sounds: Examination of the left-lower field reveals rales. Rales present. No wheezing.  Abdominal:     General: There is no distension.     Palpations: Abdomen is soft.     Tenderness: There is no abdominal tenderness.  Musculoskeletal:     Cervical back: Neck supple.  Skin:    General: Skin is warm.     Capillary Refill: Capillary refill takes less than 2 seconds.  Neurological:     Mental Status: She is alert and oriented to person, place, and time.     Motor: No abnormal muscle tone.      ____________________________________________   LABS (all labs ordered are listed, but only abnormal results are displayed)  Labs Reviewed  BASIC METABOLIC PANEL - Abnormal; Notable for the following  components:      Result Value   CO2 21 (*)    Glucose, Bld 131 (*)    BUN 30 (*)    All other components within normal limits  BRAIN NATRIURETIC PEPTIDE - Abnormal; Notable for the following components:   B Natriuretic Peptide 1,365.8 (*)    All other components within normal limits  TROPONIN I (HIGH SENSITIVITY) - Abnormal; Notable for the following components:   Troponin I (High Sensitivity) 18 (*)    All other components within normal limits  CBC  MAGNESIUM  BASIC METABOLIC PANEL  CBC  TROPONIN I (HIGH SENSITIVITY)    ____________________________________________  EKG: Atrial fibrillation with rapid ventricular response, ventricular rate 132.  QRS 128, QTc 554.  Left bundle branch block.  No acute ST elevations. ________________________________________  RADIOLOGY All imaging, including plain films, CT scans, and ultrasounds, independently reviewed by me, and interpretations confirmed via formal radiology reads.  ED MD interpretation:   Chest x-ray: Stable cardiomegaly  Official radiology report(s): DG Chest Port 1 View  Result Date: 12/29/2020 CLINICAL DATA:  Chest pain EXAM: PORTABLE CHEST 1 VIEW COMPARISON:  Chest radiograph dated May 17, 2020 FINDINGS: The heart is mildly enlarged. Pacemaker leads terminating in the right atrium and right ventricle. Hyperinflated lungs. Bibasilar atelectasis. No acute osseous abnormality. IMPRESSION: 1.  Stable cardiomegaly. 2. COPD with bibasilar atelectasis. No focal consolidation or large pleural effusion. Electronically Signed   By: Keane Police D.O.   On: 12/29/2020 13:15    ____________________________________________  PROCEDURES   Procedure(s) performed (including Critical Care):  .Critical Care Performed by: Duffy Bruce, MD Authorized by: Duffy Bruce, MD   Critical care provider statement:    Critical care time (minutes):  30   Critical care time was exclusive of:  Separately billable procedures and treating  other patients   Critical care was necessary to treat or prevent imminent or life-threatening deterioration of the following conditions:  Cardiac failure, circulatory  failure and respiratory failure   Critical care was time spent personally by me on the following activities:  Development of treatment plan with patient or surrogate, discussions with consultants, evaluation of patient's response to treatment, examination of patient, ordering and review of laboratory studies, ordering and review of radiographic studies, ordering and performing treatments and interventions, pulse oximetry, re-evaluation of patient's condition and review of old charts  ____________________________________________  [place] / MDM / [place] / ED COURSE  As part of my medical decision making, I reviewed the following data within the [place] notes reviewed and incorporated, Old chart reviewed, Notes from prior ED visits, and [place] Controlled Substance Database       *TERAH ROBEY was evaluated in Emergency Department on 12/29/2020 for the symptoms described in the history of present illness. She was evaluated in the context of the global COVID-19 pandemic, which necessitated consideration that the patient might be at risk for infection with the SARS-CoV-2 virus that causes COVID-19. Institutional protocols and algorithms that pertain to the evaluation of patients at risk for COVID-19 are in a state of rapid change based on information released by regulatory bodies including the CDC and federal and state organizations. These policies and algorithms were followed during the patient's care in the ED.  Some ED evaluations and interventions may be delayed as a result of limited staffing during the pandemic.*     Medical Decision Making:  87 yo F here with generalized weakness, shortness of breath. EKG on arrival shows AFib RVR with likely rate-related changes. No CP. BP stable.  CXR clear, and she has normal WOB. CBC, BMP unremarkable. No leukocytosis or significant anemia. Electrolytes wnl. Trop mildly elevated likely 2/2 demand. BNP elevated as well, though does not appear overtly hypervolemic. Reviewed prior Cardiology notes from Dr. Caryl Comes. Pt has had difficulty tolerating additional BB in the past. Discussed with Dr. Fletcher Anon via telephone. Will start on amio drip, admit to medicine.  Amio bolus and gtt started with improvement. Admit to medicine.   ____________________________________________  FINAL CLINICAL IMPRESSION(S) / ED DIAGNOSES  Final diagnoses:  Atrial fibrillation with rapid ventricular response (HCC)     MEDICATIONS GIVEN DURING THIS VISIT:  Medications  amiodarone (NEXTERONE) 1.8 mg/mL load via infusion 150 mg (150 mg Intravenous Bolus from Bag 12/29/20 1405)    Followed by  amiodarone (NEXTERONE PREMIX) 360-4.14 MG/200ML-% (1.8 mg/mL) IV infusion (60 mg/hr Intravenous New Bag/Given 12/29/20 1405)    Followed by  amiodarone (NEXTERONE PREMIX) 360-4.14 MG/200ML-% (1.8 mg/mL) IV infusion (has no administration in time range)  acetaminophen (TYLENOL) tablet 500 mg (has no administration in time range)  nadolol (CORGARD) tablet 60 mg (has no administration in time range)  apixaban (ELIQUIS) tablet 2.5 mg (has no administration in time range)  multivitamin with minerals tablet 1 tablet (has no administration in time range)  ondansetron (ZOFRAN) injection 4 mg (has no administration in time range)     ED Discharge Orders          Ordered    Amb referral to AFIB Clinic        12/29/20 1532             Note:  This document was prepared using Dragon voice recognition software and may include unintentional dictation errors.   Duffy Bruce, MD 12/29/20 [phone]

## 2020-12-29 NOTE — ED Triage Notes (Signed)
Pt here with CP that started this morning. Pain is centered and does not radiate. Pt in a fib on arrival to ED.

## 2020-12-29 NOTE — Progress Notes (Signed)
Cardiology Office Note    Date:  12/29/2020   ID:  Alejandra Wiley, DOB [birth year], MRN [id number]  PCP:  Kirk Ruths, MD  Cardiologist:  [name], MD  Electrophysiologist:  [name], MD   Chief Complaint: Chest pain/palpitations/shortness of breath/fatigue  History of Present Illness:   Alejandra Wiley is a 87 y.o. female with history of sinus node dysfunction status with atrial arrhythmias as well as bradycardia previously on flecainide resulting in prolonged AV nodal conduction post PPM in 2005 with generator change out in 2014, PAF, chronic combined systolic and diastolic CHF with subsequent normalization of LV systolic function by echo in 11/2018, possible CVA, Covid in 09/2020, and HTN who presents for evaluation of chest pain, palpitations, shortness of breath, and fatigue.  Echo in 06/2017 demonstrated an EF of 50 to 55%, normal wall motion, grade 2 diastolic dysfunction, mild to moderate mitral regurgitation, normal-sized left atrium, normal RV systolic function, mildly dilated right atrium, PASP 44 mmHg.  Echo in 08/2017 demonstrated an EF of 50 to 55%, no regional wall motion abnormalities, moderate to severe mitral regurgitation, mildly dilated left atrium, mildly dilated right atrium, PASP 38 mmHg.  Echo in 01/2018, in the context of possible CVA with subtherapeutic INR at 1.5 to, demonstrated an EF of 30 to 35%, diffuse hypokinesis, grade 1 diastolic dysfunction, moderately dilated RV, and mildly dilated RA.  CT head showed no acute stroke.  MRI was not performed due to pacemaker.  Carotid artery ultrasound demonstrated minimal heterogeneous and calcified plaque with no hemodynamically significant stenosis.  Given LV dysfunction, flecainide was discontinued.  Repeat echo in 11/2018 demonstrated an improved LVSF with an EF of 55%, possible mild concentric LVH, grade 1 diastolic dysfunction, normal RV systolic function and ventricular cavity size, mildly dilated left  atrium, mild to moderate tricuspid regurgitation, mildly elevated PASP estimated at 37.7 mmHg.  She was admitted in 04/2020 with weakness and found to have A. fib with RVR leading to an increase in nadolol.  Echo at that time demonstrated an EF of 50%, no regional wall motion abnormalities, mild LVH, grade 2 diastolic dysfunction, low normal RV systolic function with normal ventricular cavity size, moderately dilated left atrium, mild to moderate mitral regurgitation, and an estimated right atrial pressure of 3 mmHg.  Throughout the summer 2022 she was having increased palpitations.  In follow-up with EP in 07/2020 note indicates no interval A. fib.  Treatment options were discussed including continuing rate control, antiarrhythmic therapy with either dofetilide or amiodarone versus catheter ablation.  Primary electrophysiologist indicated he would discuss this with one of his partners.  She was most recently seen in the office, by her primary electrophysiologist, on 12/16/2020 and reported she was feeling much better on higher dose nadolol.  She did report exertional dyspnea.  She was continued on nadolol 60 mg twice daily.  Orthostatic hypotension has precluded further escalation of this medication.  It was recommended digoxin to be added, though delayed until after her upcoming wedding in early 2023.  Most recent pacemaker check showed normal device function.  There were 165 AHR episodes with a burden of 5.9%, longest episode was 31 hours.  There were 15 V HR episodes with the longest episode appearing to be A. fib with RVR lasting 7 seconds.  Device was programmed to optimize intrinsic conduction.  She contacted our office earlier this morning reporting fatigue and weakness and felt like her heart was "out of rhythm."  Documented heart rate of 98  bpm with a BP of 132/86.  Given this, she was scheduled for available appointment today.  She comes in accompanied by her fianc today.  She had been in her  usual state of health and was doing quite well up until 10/12.  She had a very busy day that day helping to move furniture.  With this, she began to note left-sided chest pressure with associated shortness of breath and profound fatigue.  This was followed by the development of palpitations.  Typically, she is asymptomatic when in A. fib.  These symptoms have been present and intermittent since.  She also reports having had 4 episodes of severe, sharp right sided neck discomfort radiating to her ear approximately 4 weeks ago.  Here in the office this morning she continues to note left-sided chest pressure with associated shortness of breath and profound fatigue.  She indicates she feels so weak she "cannot even get off of the exam table."  She reports she has been adherent to her anticoagulation and denies missing any doses.  Case has been discussed with Drs. Gollan, Agbor-Etang, and Arida.   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 on Coumadin and flecainide; b. CHADS2VASc 3 (HTN, age x 2, female)   Sick sinus syndrome ([place])    Sinoatrial node dysfunction ([place])    Stroke ([place])    Vaginal intraepithelial neoplasia     Past Surgical History:  Procedure Laterality Date   APPENDECTOMY     HERNIA REPAIR     lower abd   IMAGE GUIDED SINUS SURGERY  2015   INSERT / REPLACE / REMOVE PACEMAKER  12/06/2003   Medtronic impulse DTDR01   JOINT REPLACEMENT Right    knee   KYPHOPLASTY N/A 06/23/2017   Procedure: [id number];  Surgeon: Hessie Knows, MD;  Location:  [facility] ORS;  Service: Orthopedics;  Laterality: N/A;   LEEP  1999   ORIF HIP FRACTURE Right 02/2016   PACEMAKER GENERATOR CHANGE N/A 09/18/2012   Procedure: PACEMAKER GENERATOR CHANGE;  Surgeon: Deboraha Sprang, MD;  Location: [facility] CATH LAB;  Service: Cardiovascular;  Laterality: N/A;   TONSILLECTOMY     TOTAL KNEE ARTHROPLASTY     TOTAL KNEE REVISION Right 09/23/2016   Procedure: TOTAL KNEE REVISION-POLYETHYLENE EXCHANGE;  Surgeon: Hessie Knows, MD;  Location: [facility] ORS;  Service: Orthopedics;  Laterality: Right;   VAGINAL HYSTERECTOMY      Current Medications: Current Meds  Medication Sig   acetaminophen (TYLENOL) 500 MG tablet Take 500 mg by mouth every 8 (eight) hours as needed for mild pain or headache.   ELIQUIS 2.5 MG TABS tablet TAKE 1 TABLET TWICE DAILY   fluticasone (FLONASE) 50 MCG/ACT nasal spray Place 2 sprays into both nostrils daily as needed for allergies or rhinitis.    furosemide (LASIX) 40 MG tablet Take 1 tablet (40 mg total) by mouth as needed.   ibandronate (BONIVA) 150 MG tablet Take 150 mg by mouth every 30 (thirty) days. Take in the morning with a full glass of water, on  an empty stomach, and do not take anything else by mouth or lie down for the next 30 min.   Multiple Vitamins-Minerals (SENIOR MULTIVITAMIN PLUS PO) Take by mouth.   Multiple Vitamins-Minerals (ZINC PO) Take by mouth.   nadolol (CORGARD) 20 MG tablet Take 3 tablets (60 mg total) by mouth 2 (two) times daily.   potassium chloride SA (KLOR-CON) 20 MEQ tablet TAKE 1/2 TABLET TWICE DAILY (EVERY MORNING AND EVERY EVENING)    Allergies:   Latex, Other, Tape, Xarelto [rivaroxaban], and Nickel   Social History   Socioeconomic History   Marital status: Married    Spouse name: Not on file   Number of children: Not on file   Years of education: Not on file   Highest education level: Not on file  Occupational History   Occupation: Part time  Tobacco Use   Smoking status: Never   Smokeless tobacco: Never   Vaping Use   Vaping Use: Never used  Substance and Sexual Activity   Alcohol use: Yes    Alcohol/week: 1.0 standard drink    Types: 1 Glasses of wine per week    Comment: occasionally   Drug use: No   Sexual activity: Not Currently  Other Topics Concern   Not on file  Social History Narrative   Pt gets regular exercise.   Social Determinants of Health   Financial Resource Strain: Not on file  Food Insecurity: Not on file  Transportation Needs: Not on file  Physical Activity: Not on file  Stress: Not on file  Social Connections: Not on file     Family History:  The patient's family history includes Diabetes in her daughter and son; Heart disease in her father; Stroke in her mother.  ROS:   Review of Systems  Constitutional:  Positive for malaise/fatigue. Negative for chills, diaphoresis, fever and weight loss.  HENT:  Negative for congestion.   Eyes:  Negative for discharge and redness.  Respiratory:  Positive for shortness of breath. Negative for cough, sputum production and wheezing.   Cardiovascular:  Positive for chest pain and palpitations. Negative for orthopnea, claudication, leg swelling and PND.  Gastrointestinal:  Positive for nausea. Negative for abdominal pain, blood in stool, heartburn, melena and vomiting.  Musculoskeletal:  Negative for falls and myalgias.  Skin:  Negative for rash.  Neurological:  Positive for weakness. Negative for dizziness, tingling, tremors, sensory change, speech change, focal weakness and loss of consciousness.  Endo/Heme/Allergies:  Does not bruise/bleed easily.  Psychiatric/Behavioral:  Negative for substance abuse. The patient is not nervous/anxious.   All other systems reviewed and are negative.   EKGs/Labs/Other Studies Reviewed:    Studies reviewed were summarized above. The additional studies were reviewed today: As above.  EKG:  EKG is ordered today.  The EKG ordered today demonstrates A. fib with RVR, 118 bpm, LBBB, rare  PVC, uncertain if LBBB is new as prior EKGs show a paced rhythm  Recent Labs: 05/17/2020: ALT 14; B Natriuretic Peptide 371.0; TSH 2.420 05/18/2020: Hemoglobin 11.3; Platelets 194 05/19/2020: BUN 28; Creatinine, Ser 0.77; Magnesium 2.2; Potassium 4.1; Sodium 138  Recent Lipid Panel    Component Value Date/Time   CHOL 157 02/10/2018 0440   TRIG 61 02/10/2018 0440   HDL 62 02/10/2018 0440   CHOLHDL 2.5 02/10/2018 0440   VLDL 12 02/10/2018 0440   LDLCALC 83 02/10/2018 0440    PHYSICAL EXAM:    VS:  BP 138/80 (BP Location: Left Arm, Patient Position: Sitting, Cuff Size: Normal)  Pulse (!) 118   Ht 5\' 7"  (1.702 m)   Wt 125 lb 6 oz (56.9 kg)   SpO2 96%   BMI 19.64 kg/m   BMI: Body mass index is 19.64 kg/m.  Physical Exam Vitals reviewed.  Constitutional:      Appearance: She is well-developed.     Comments: Weak appearing  HENT:     Head: Normocephalic and atraumatic.  Eyes:     General:        Right eye: No discharge.        Left eye: No discharge.  Neck:     Vascular: No JVD.  Cardiovascular:     Rate and Rhythm: Tachycardia present. Rhythm irregularly irregular.     Pulses:          Posterior tibial pulses are 2+ on the right side and 2+ on the left side.     Heart sounds: Normal heart sounds, S1 normal and S2 normal. Heart sounds not distant. No midsystolic click and no opening snap. No murmur heard.   No friction rub.  Pulmonary:     Effort: Pulmonary effort is normal. No respiratory distress.     Breath sounds: Normal breath sounds. No decreased breath sounds, wheezing or rales.  Chest:     Chest wall: No tenderness.  Abdominal:     General: There is no distension.     Palpations: Abdomen is soft.     Tenderness: There is no abdominal tenderness.  Musculoskeletal:     Cervical back: Normal range of motion.     Right lower leg: No edema.     Left lower leg: No edema.  Skin:    General: Skin is warm and dry.     Nails: There is no clubbing.  Neurological:      Mental Status: She is alert and oriented to person, place, and time.  Psychiatric:        Speech: Speech normal.        Behavior: Behavior normal.        Thought Content: Thought content normal.        Judgment: Judgment normal.    Wt Readings from Last 3 Encounters:  12/29/20 120 lb (54.4 kg)  12/29/20 125 lb 6 oz (56.9 kg)  12/16/20 123 lb (55.8 kg)     ASSESSMENT & PLAN:   PAF with RVR: Typically she does not feel her episodes of A. fib.  Currently in A. fib with RVR in the 1 teens to 120s bpm.  She remains on nadolol 60 mg twice daily with orthostatic hypotension precluding further escalation of beta-blocker.  Electrophysiology has mentioned possible addition of digoxin moving forward, given her advanced age I will defer that at this time.  They also have considered initiating dofetilide or amiodarone.  Pending ED evaluation, she may benefit from addition of amiodarone followed by DCCV.  CHA2DS2-VASc at least 7 (CHF, HTN, age x2, CVA x2, sex category).  She remains on apixaban 2.5 mg twice daily (age and weight and denies missing any doses or bleeding.  Most recent device interrogation demonstrates her overall A. fib burden appears consistent with prior readings throughout 2021 and 2022, however she is very symptomatic with her current episode and I cannot exclude alternative etiologies contributing to her symptoms.  Unstable angina/dyspnea/fatigue/LBBB: She continues to have intermittent chest pressure in the office today.  Case was discussed with interventional cardiology with recommendation to proceed through the ED and not activate code STEMI at this time.  It is uncertain if her LBBB is new or chronic as her prior EKGs in the office demonstrate a paced rhythm.  LBBB may be rate related as well.  If she rules out, and remains in A. fib with RVR, she would need DCCV and reassessment of symptoms thereafter.  Chronic combined systolic and diastolic CHF: LV systolic function normalized by  echo in 11/2018 and was normal by most recent echo earlier this year.  With possible new left bundle branch block she will need an echo initially to reevaluate LV systolic function with possible ischemic evaluation thereafter pending the above work-up and treatment plan.  Weight is stable when compared to her last outpatient visit.  She remains on nadolol.  Orthostatic hypotension precludes escalation of GDMT at this time.  Evidence-based medical therapy should be escalated as tolerated.  Sinus node dysfunction: Status post PPM.  Follow-up with EP as directed.  HTN with orthostatic hypotension: Blood pressure blood pressure is reasonably controlled in the office today at 138/80.  She remains on nadolol 60 mg twice daily, and with history of orthostatic hypotension we will defer escalation of beta-blocker for added rate control at this time.    Discussed with DOD and interventional cardiology with recommendation to proceed through the ED for full work-up.  Interventional cardiology does not recommend activating code STEMI at this time.    Disposition: F/u with Dr. Caryl Comes or an APP after ED evaluation.   Medication Adjustments/Labs and Tests Ordered: Current medicines are reviewed at length with the patient today.  Concerns regarding medicines are outlined above. Medication changes, Labs and Tests ordered today are summarized above and listed in the Patient Instructions accessible in Encounters.   Signed, Christell Faith, PA-C 12/29/2020 12:36 PM     [address] [place], [place] [address] [phone]

## 2020-12-29 NOTE — Consult Note (Signed)
Cardiology Consultation:   Patient ID: MIAISABELLA BACORN MRN: [id number]; DOB: [birth year]  Admit date: 12/29/2020 Date of Consult: 12/29/2020  PCP:  Kirk Ruths, MD   [facility] Providers Cardiologist:  [name], MD  Electrophysiologist:  [name], MD       Patient Profile:   Alejandra Wiley is a 87 y.o. female with a hx of sinus node dysfunction status post pacemaker and paroxysmal atrial fibrillation who is being seen 12/29/2020 for the evaluation of A. fib with RVR at the request of Dr. Francine Graven. .  History of Present Illness:   Alejandra Wiley is a 87 y.o. female with history of sinus node dysfunction status with atrial arrhythmias as well as bradycardia previously on flecainide resulting in prolonged AV nodal conduction post PPM in 2005 with generator change out in 2014, PAF, chronic combined systolic and diastolic CHF with subsequent normalization of LV systolic function by echo in 11/2018, possible CVA, Covid in 09/2020, and HTN who presents for evaluation of chest pain, palpitations, shortness of breath, and fatigue.    Echo in 01/2018, in the context of possible CVA with subtherapeutic INR at 1.5 to, demonstrated an EF of 30 to 35%, diffuse hypokinesis, grade 1 diastolic dysfunction, moderately dilated RV, and mildly dilated RA.  CT head showed no acute stroke.  MRI was not performed due to pacemaker.  Carotid artery ultrasound demonstrated minimal heterogeneous and calcified plaque with no hemodynamically significant stenosis.  Given LV dysfunction, flecainide was discontinued.   Repeat echo in 11/2018 demonstrated an improved LVSF with an EF of 55%, possible mild concentric LVH, grade 1 diastolic dysfunction, normal RV systolic function and ventricular cavity size, mildly dilated left atrium, mild to moderate tricuspid regurgitation, mildly elevated PASP estimated at 37.7 mmHg.   She was admitted in 04/2020 with weakness and found to have A. fib with RVR leading to an  increase in nadolol.  Echo at that time demonstrated an EF of 50%, no regional wall motion abnormalities, mild LVH, grade 2 diastolic dysfunction, low normal RV systolic function with normal ventricular cavity size, moderately dilated left atrium, mild to moderate mitral regurgitation, and an estimated right atrial pressure of 3 mmHg.   Throughout the summer 2022 she was having increased palpitations.  In follow-up with EP in 07/2020 note indicates no interval A. fib.  Treatment options were discussed including continuing rate control, antiarrhythmic therapy with either dofetilide or amiodarone versus catheter ablation.     She was most recently seen in the office, by her primary electrophysiologist, on 12/16/2020 and reported she was feeling much better on higher dose nadolol.     She contacted our office earlier this morning reporting fatigue and weakness and felt like her heart was "out of rhythm."  Documented heart rate of 98 bpm with a BP of 132/86.  She has been feeling poorly for 5 days.  She was seen in our office by Thurmond Butts done today due to increased fatigue and significant shortness of breath with minimal chest discomfort. She was noted to be in A. fib with RVR and thus she was sent to the ED for evaluation. Given significant symptoms on presentation and tachycardia, I recommended starting amiodarone drip which was initiated.  She seems to be in sinus rhythm now and she feels better but not back to baseline.  Troponin was only minimally elevated.   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 on Coumadin and flecainide; b. CHADS2VASc 3 (HTN, age x 2, female)   Sick sinus syndrome ([place])    Sinoatrial node dysfunction ([place])    Stroke [facility])    Vaginal  intraepithelial neoplasia     Past Surgical History:  Procedure Laterality Date   APPENDECTOMY     HERNIA REPAIR     lower abd   IMAGE GUIDED SINUS SURGERY  2015   INSERT / REPLACE / REMOVE PACEMAKER  12/06/2003   Medtronic impulse DTDR01   JOINT REPLACEMENT Right    knee   KYPHOPLASTY N/A 06/23/2017   Procedure: [id number];  Surgeon: Hessie Knows, MD;  Location: [facility] ORS;  Service: Orthopedics;  Laterality: N/A;   LEEP  1999   ORIF HIP FRACTURE Right 02/2016   PACEMAKER GENERATOR CHANGE N/A 09/18/2012   Procedure: PACEMAKER GENERATOR CHANGE;  Surgeon: Deboraha Sprang, MD;  Location: [facility] CATH LAB;  Service: Cardiovascular;  Laterality: N/A;   TONSILLECTOMY     TOTAL KNEE ARTHROPLASTY     TOTAL KNEE REVISION Right 09/23/2016   Procedure: TOTAL KNEE REVISION-POLYETHYLENE EXCHANGE;  Surgeon: Hessie Knows, MD;  Location: [facility] ORS;  Service: Orthopedics;  Laterality: Right;   VAGINAL HYSTERECTOMY       Home Medications:  Prior to Admission medications   Medication Sig Start Date End Date Taking? Authorizing Provider  acetaminophen (TYLENOL) 500 MG tablet Take 500 mg by mouth every 8 (eight) hours as needed for mild pain or headache.   Yes [provider]  ELIQUIS 2.5 MG TABS tablet TAKE 1 TABLET TWICE DAILY 07/18/20  Yes Deboraha Sprang, MD  fluticasone [facility]) 50 MCG/ACT nasal spray Place 2 sprays into both nostrils daily as needed for allergies or rhinitis.    Yes [provider]  ibandronate (BONIVA) 150 MG tablet Take 150 mg by mouth every 30 (thirty) days. Take in the morning with a full glass of water, on an empty stomach, and do not take anything else by mouth or lie down for the next 30 min.   Yes [provider]  Multiple Vitamins-Minerals (SENIOR MULTIVITAMIN PLUS PO) Take 1 tablet by mouth daily.   Yes [provider]  Multiple Vitamins-Minerals (ZINC PO) Take 1 tablet by mouth daily.   Yes [provider]  nadolol (CORGARD) 20 MG  tablet Take 3 tablets (60 mg total) by mouth 2 (two) times daily. 11/20/20 01/19/21 Yes Deboraha Sprang, MD  potassium chloride SA (KLOR-CON) 20 MEQ tablet TAKE 1/2 TABLET TWICE DAILY (EVERY MORNING AND EVERY EVENING) Patient taking differently: Take 10 mEq by mouth daily as needed (leg cramps). 07/18/20  Yes Camnitz, Will Hassell Done, MD  furosemide (LASIX) 40 MG tablet Take 1 tablet (40 mg total) by mouth as needed. Patient not taking: Reported on 12/29/2020 05/29/20 12/29/20  Antony Madura, PA-C    Inpatient Medications: Scheduled Meds:  apixaban  2.5 mg Oral BID   [START ON 12/30/2020] multivitamin with minerals  1 tablet Oral Daily   nadolol  60 mg Oral BID   Continuous Infusions:  amiodarone 60 mg/hr (12/29/20 1405)   Followed by   amiodarone     PRN Meds: acetaminophen, ondansetron (ZOFRAN) IV  Allergies:    Allergies  Allergen Reactions   Latex Other (See Comments)    blisters  Other Other (See Comments)    Blisters/ paper tape is Market researcher paper tape is Market researcher paper tape is Clinical research associate paper tape is Ok   Adult nurse (See Comments)    Blisters/ paper tape is Market researcher paper tape is Ok   Xarelto [Rivaroxaban] Other (See Comments)    Gums bleeding and too much bruising   Nickel Rash and Other (See Comments)    Social History:   Social History   Socioeconomic History   Marital status: Widowed    Spouse name: Not on file   Number of children: Not on file   Years of education: Not on file   Highest education level: Not on file  Occupational History   Occupation: Part time  Tobacco Use   Smoking status: Never   Smokeless tobacco: Never  Vaping Use   Vaping Use: Never used  Substance and Sexual Activity   Alcohol use: Yes    Alcohol/week: 1.0 standard drink    Types: 1 Glasses of wine per week    Comment: occasionally   Drug use: No   Sexual activity: Not Currently  Other Topics Concern   Not on file  Social History Narrative   Pt gets regular  exercise.   Social Determinants of Health   Financial Resource Strain: Not on file  Food Insecurity: Not on file  Transportation Needs: Not on file  Physical Activity: Not on file  Stress: Not on file  Social Connections: Not on file  Intimate Partner Violence: Not on file    Family History:    Family History  Problem Relation Age of Onset   Stroke Mother    Heart disease Father    Diabetes Daughter    Diabetes Son      ROS:  Please see the history of present illness.   All other ROS reviewed and negative.     Physical Exam/Data:   Vitals:   12/29/20 1330 12/29/20 1400 12/29/20 1430 12/29/20 1500  BP: (!) 151/115 (!) 134/106 (!) 121/94 135/87  Pulse: (!) 120 91 90 (!) 108  Resp: 20 (!) 21 (!) 21 20  Temp:      TempSrc:      SpO2: 96% 97% 97% 98%  Weight:      Height:       No intake or output data in the 24 hours ending 12/29/20 1736 Last 3 Weights 12/29/2020 12/29/2020 12/16/2020  Weight (lbs) 120 lb 125 lb 6 oz 123 lb  Weight (kg) 54.432 kg 56.87 kg 55.792 kg     Body mass index is 19.37 kg/m.  General:  Well nourished, well developed, in no acute distress HEENT: normal Neck: no JVD Vascular: No carotid bruits; Distal pulses 2+ bilaterally Cardiac:  normal S1, S2; regular rate and rhythm with 1 out of 6 systolic murmur in the aortic area. Lungs:  clear to auscultation bilaterally, no wheezing, rhonchi or rales  Abd: soft, nontender, no hepatomegaly  Ext: no edema Musculoskeletal:  No deformities, BUE and BLE strength normal and equal Skin: warm and dry  Neuro:  CNs 2-12 intact, no focal abnormalities noted Psych:  Normal affect   EKG:  The EKG was personally reviewed and demonstrates: Atrial fibrillation with rapid ventricular response and left bundle branch block. Telemetry:  Telemetry was personally reviewed and demonstrates: Sinus rhythm with premature beats.  Relevant CV Studies:   Laboratory Data:  High Sensitivity Troponin:   Recent Labs   Lab 12/29/20 1237 12/29/20 1519  TROPONINIHS  16 18*     Chemistry Recent Labs  Lab 12/29/20 1237  NA 135  K 4.7  CL 106  CO2 21*  GLUCOSE 131*  BUN 30*  CREATININE 0.87  CALCIUM 9.4  MG 2.4  GFRNONAA >60  ANIONGAP 8    No results for input(s): PROT, ALBUMIN, AST, ALT, ALKPHOS, BILITOT in the last 168 hours. Lipids No results for input(s): CHOL, TRIG, HDL, LABVLDL, LDLCALC, CHOLHDL in the last 168 hours.  Hematology Recent Labs  Lab 12/29/20 1237  WBC 7.9  RBC 3.89  HGB 12.2  HCT 37.9  MCV 97.4  MCH 31.4  MCHC 32.2  RDW 13.8  PLT 236   Thyroid No results for input(s): TSH, FREET4 in the last 168 hours.  BNP Recent Labs  Lab 12/29/20 1237  BNP 1,365.8*    DDimer No results for input(s): DDIMER in the last 168 hours.   Radiology/Studies:  DG Chest Port 1 View  Result Date: 12/29/2020 CLINICAL DATA:  Chest pain EXAM: PORTABLE CHEST 1 VIEW COMPARISON:  Chest radiograph dated May 17, 2020 FINDINGS: The heart is mildly enlarged. Pacemaker leads terminating in the right atrium and right ventricle. Hyperinflated lungs. Bibasilar atelectasis. No acute osseous abnormality. IMPRESSION: 1.  Stable cardiomegaly. 2. COPD with bibasilar atelectasis. No focal consolidation or large pleural effusion. Electronically Signed   By: Keane Police D.O.   On: 12/29/2020 13:15     Assessment and Plan:   Persistent atrial fibrillation with RVR: The patient was highly symptomatic on presentation with significant tachycardia.  It seems that her episodes of atrial fibrillation are happening more frequently.  Thus, I feel that she requires an antiarrhythmic medication.  She responded very well to IV amiodarone and we will plan to keep her on IV infusion overnight.  We will transition to oral amiodarone tomorrow morning.  Continue anticoagulation with Eliquis.  CHA2DS2-VASc score is at least 7. Acute on chronic diastolic heart failure: I suspect that worsening dyspnea is likely due to  heart failure in the setting of A. fib with RVR.  Her BNP was elevated.  By exam, I do not appreciate significant volume overload.  Hopefully, with restoration of sinus rhythm she will feel back to baseline.  If she is still dyspneic tomorrow, can consider 1 dose of IV furosemide.  She does have prior history of cardiomyopathy and there is a possibility of tachycardia induced cardiomyopathy given that she has been in A. fib for at least few days.  Most recent echocardiogram in May showed low normal ejection fraction. Unstable angina: Some of her chest pain is likely due to demand ischemia in setting of significant tachycardia.  Left bundle branch block likely rate dependent.  Troponin was only minimally elevated.  No evidence of acute coronary syndrome at this point.   For questions or updates, please contact [place] Please consult [email or web address] for contact info under    Signed, Kathlyn Sacramento, MD  12/29/2020 5:36 PM

## 2020-12-30 ENCOUNTER — Inpatient Hospital Stay (HOSPITAL_COMMUNITY): Payer: Medicare HMO

## 2020-12-30 ENCOUNTER — Encounter: Payer: Self-pay | Admitting: Internal Medicine

## 2020-12-30 DIAGNOSIS — R072 Precordial pain: Secondary | ICD-10-CM | POA: Diagnosis not present

## 2020-12-30 DIAGNOSIS — I5021 Acute systolic (congestive) heart failure: Secondary | ICD-10-CM

## 2020-12-30 DIAGNOSIS — I4891 Unspecified atrial fibrillation: Secondary | ICD-10-CM | POA: Diagnosis not present

## 2020-12-30 DIAGNOSIS — I10 Essential (primary) hypertension: Secondary | ICD-10-CM

## 2020-12-30 LAB — TROPONIN I (HIGH SENSITIVITY)
Troponin I (High Sensitivity): 21 ng/L — ABNORMAL HIGH (ref <18)
Troponin I (High Sensitivity): 23 ng/L — ABNORMAL HIGH (ref <18)

## 2020-12-30 LAB — NM MYOCAR MULTI W/SPECT W/WALL MOTION / EF
Estimated workload: 1
Exercise duration (min): 0 min
Exercise duration (sec): 0 s
LV dias vol: 48 mL (ref 46–106)
LV sys vol: 34 mL
MPHR: 134 {beats}/min
Nuc Stress EF: 29 %
Peak HR: 88 {beats}/min
Percent HR: 65 %
Rest HR: 78 {beats}/min
Rest Nuclear Isotope Dose: 10.3 mCi
SDS: 0
SRS: 9
SSS: 6
ST Depression (mm): 0 mm
Stress Nuclear Isotope Dose: 32.2 mCi
TID: 0.96

## 2020-12-30 LAB — BASIC METABOLIC PANEL
Anion gap: 7 (ref 5–15)
BUN: 28 mg/dL — ABNORMAL HIGH (ref 8–23)
CO2: 21 mmol/L — ABNORMAL LOW (ref 22–32)
Calcium: 8.9 mg/dL (ref 8.9–10.3)
Chloride: 104 mmol/L (ref 98–111)
Creatinine, Ser: 0.88 mg/dL (ref 0.44–1.00)
GFR, Estimated: >60 mL/min (ref >60)
Glucose, Bld: 145 mg/dL — ABNORMAL HIGH (ref 70–99)
Potassium: 4.2 mmol/L (ref 3.5–5.1)
Sodium: 132 mmol/L — ABNORMAL LOW (ref 135–145)

## 2020-12-30 LAB — CBC
HCT: 37.9 % (ref 36.0–46.0)
Hemoglobin: 12.5 g/dL (ref 12.0–15.0)
MCH: 31.3 pg (ref 26.0–34.0)
MCHC: 33 g/dL (ref 30.0–36.0)
MCV: 95 fL (ref 80.0–100.0)
Platelets: 224 10*3/uL (ref 150–400)
RBC: 3.99 MIL/uL (ref 3.87–5.11)
RDW: 13.9 % (ref 11.5–15.5)
WBC: 8 10*3/uL (ref 4.0–10.5)
nRBC: 0 % (ref 0.0–0.2)

## 2020-12-30 LAB — RESP PANEL BY RT-PCR (FLU A&B, COVID) ARPGX2
Influenza A by PCR: NEGATIVE
Influenza B by PCR: NEGATIVE
SARS Coronavirus 2 by RT PCR: NEGATIVE

## 2020-12-30 MED ORDER — NITROGLYCERIN 0.4 MG SL SUBL
0.4000 mg | SUBLINGUAL_TABLET | SUBLINGUAL | Status: AC | PRN
  Administered 2020-12-30 (×2): 0.4 mg via SUBLINGUAL

## 2020-12-30 MED ORDER — TECHNETIUM TC 99M TETROFOSMIN IV KIT
10.0000 | PACK | Freq: Once | INTRAVENOUS | Status: AC | PRN
  Administered 2020-12-30: 10.3 via INTRAVENOUS

## 2020-12-30 MED ORDER — TECHNETIUM TC 99M TETROFOSMIN IV KIT
30.0000 | PACK | Freq: Once | INTRAVENOUS | Status: AC | PRN
  Administered 2020-12-30: 31.7 via INTRAVENOUS

## 2020-12-30 MED ORDER — NITROGLYCERIN 0.4 MG SL SUBL: 0.4000 mg | SUBLINGUAL_TABLET | SUBLINGUAL | Status: DC | PRN

## 2020-12-30 MED ORDER — REGADENOSON 0.4 MG/5ML IV SOLN
0.4000 mg | Freq: Once | INTRAVENOUS | Status: AC
Start: 2020-12-30 — End: 2020-12-30
  Administered 2020-12-30: 0.4 mg via INTRAVENOUS
  Filled 2020-12-30: qty 5

## 2020-12-30 MED ORDER — FUROSEMIDE 10 MG/ML IJ SOLN
20.0000 mg | Freq: Once | INTRAMUSCULAR | Status: AC
  Administered 2020-12-30: 20 mg via INTRAVENOUS
  Filled 2020-12-30: qty 2

## 2020-12-30 MED ORDER — NITROGLYCERIN 0.4 MG SL SUBL
SUBLINGUAL_TABLET | SUBLINGUAL | Status: AC
  Administered 2020-12-30: 0.4 mg via SUBLINGUAL
  Filled 2020-12-30: qty 1

## 2020-12-30 MED ORDER — MENTHOL 3 MG MT LOZG
1.0000 | LOZENGE | OROMUCOSAL | Status: DC | PRN
  Filled 2020-12-30: qty 9

## 2020-12-30 MED ORDER — AMIODARONE HCL 200 MG PO TABS
200.0000 mg | ORAL_TABLET | Freq: Two times a day (BID) | ORAL | Status: DC
  Administered 2020-12-30 – 2020-12-31 (×3): 200 mg via ORAL
  Filled 2020-12-30 (×3): qty 1

## 2020-12-30 MED ORDER — SODIUM CHLORIDE 0.9 % IV SOLN: INTRAVENOUS | Status: DC

## 2020-12-30 NOTE — TOC Initial Note (Signed)
Transition of Care [facility]) - Initial/Assessment Note    Patient Details  Name: DEANE MELICK MRN: [id number] Date of Birth: 1932/06/30  Transition of [facility]) CM/SW Contact:    Alberteen Sam, LCSW Phone Number: 12/30/2020, 12:35 PM  Clinical Narrative:                  CSW spoke with patient and spouse at bedside regarding discharge planning. Patient reports her and spouse are from [facility] independent living reports at discharge she would be agreeable to participate in [facility] outpatient PT however would not be agreeable to go to their rehab.   CSW notes patient has stress test scheduled for 1:30.   TOC will continue to follow.   Expected Discharge Plan: [place] Barriers to Discharge: Continued Medical Work up   Patient Goals and CMS Choice Patient states their goals for this hospitalization and ongoing recovery are:: to go home CMS Medicare.gov Compare Post Acute Care list provided to:: Patient Choice offered to / list presented to : Patient  Expected Discharge Plan and Services Expected Discharge Plan: [place] Choice: [place] arrangements for the past 2 months: [place] [facility] [place])                                      Prior Living Arrangements/Services Living arrangements for the past 2 months: [place] ([place] [facility]) Lives with:: Spouse                   Activities of Daily Living Home Assistive Devices/Equipment: None ADL Screening (condition at time of admission) Patient's cognitive ability adequate to safely complete daily activities?: Yes Is the patient deaf or have difficulty hearing?: No Does the patient have difficulty seeing, even when wearing glasses/contacts?: No Does the patient have difficulty concentrating, remembering, or making decisions?: No Patient able to express need for assistance with ADLs?: Yes Does the patient  have difficulty dressing or bathing?: No Independently performs ADLs?: Yes (appropriate for developmental age) Does the patient have difficulty walking or climbing stairs?: No Weakness of Legs: None Weakness of Arms/Hands: None  Permission Sought/Granted                  Emotional Assessment Appearance:: Appears stated age Attitude/Demeanor/Rapport: Gracious Affect (typically observed): Calm Orientation: : Oriented to Self, Oriented to Place, Oriented to  Time, Oriented to Situation Alcohol / Substance Use: Not Applicable Psych Involvement: No (comment)  Admission diagnosis:  Atrial fibrillation with rapid ventricular response ([place]) [I48.91] Atrial fibrillation with RVR ([place]) [I48.91] Chest pain [R07.9] Patient Active Problem List   Diagnosis Date Noted   Atrial fibrillation with rapid ventricular response ([place]) 05/19/2020   Atrial fibrillation with RVR ([place]) 05/17/2020   Generalized weakness 97/35/3299   Chronic systolic (congestive) heart failure ([place]) 03/31/2018   Arm weakness 02/09/2018   UTI (urinary tract infection) 11/28/2017   Protein calorie malnutrition ([place]) 10/10/2017   Falls 10/10/2017   Weakness 10/10/2017   Muscle atrophy 10/10/2017   Sinus node dysfunction ([place]) 10/10/2017   Syncope and collapse 09/12/2017   Pyelonephritis 08/26/2017   Chronic diastolic heart failure ([place]) 07/19/2017   Hypertension 07/19/2017   Painful total knee replacement ([place]) 09/23/2016   Fungal sinusitis 11/08/2013   ATRIAL FIBRILLATION 06/13/2008   PACEMAKER-Medtronic 06/13/2008   PCP:  Kirk Ruths, MD Pharmacy:   [facility] [place], [address] [address] Phone: [phone] Fax: [phone]     Social Determinants of Health (SDOH) Interventions    Readmission Risk Interventions No flowsheet data found.

## 2020-12-30 NOTE — Progress Notes (Signed)
O2 increased to 4 per standing order

## 2020-12-30 NOTE — Progress Notes (Signed)
Nitro tabs x2 given with positive effectiveness, chest pressure 2/10.  3rd Nitro tab administered.

## 2020-12-30 NOTE — Progress Notes (Signed)
Bedside report given from night nurse, Crystal.  Patient states her chest pressure is gone at this time and she is resting comfortably.

## 2020-12-30 NOTE — Progress Notes (Signed)
PROGRESS NOTE  Alejandra Wiley QIO:[id number] DOB: February 19, 1932 DOA: 12/29/2020 PCP: Kirk Ruths, MD   LOS: 1 day   Brief narrative:  Alejandra Wiley is a 87 y.o. female with past medical history of paroxysmal atrial fibrillation, chronic combined systolic and diastolic heart failure with left ventricular ejection fraction of 50%, hypertension, status post pacemaker placement for sick sinus syndrome presented to hospital with 3-day history of chest pressure, palpitations and shortness of breath breath.  In the ED, sodium was 135 with potassium of 4.7.  Creatinine was 0.8.  Troponin was 16.  BNP mildly elevated at 1365.  Chest x-ray showed some cardiomegaly with bibasilar atelectasis.  No focal consolidation or pleural effusion.  CT showed atrial fibrillation with rapid ventricular response and left bundle branch block.  Patient was given amiodarone drip in the ED and was admitted hospital.  Cardiology was consulted.  Assessment/Plan:  Principal Problem:   Atrial fibrillation with rapid ventricular response (HCC) Active Problems:   Chronic diastolic heart failure (HCC)   Hypertension  Persistent symptomatic atrial fibrillation with rapid ventricular rate On presentation.  Was started on amiodarone drip as per cardiology recommendation.  On nadolol..  Continue Eliquis for anticoagulation for CHA2DS2-VASc score of 7   Acute on chronic combined heart failure. Currently on beta-blocker and amiodarone for rate control.  Continue Eliquis.  Cardiology on board.  Will follow recommendations.  Check 2D echocardiogram.  Received 1 dose of IV Lasix today.  DVT prophylaxis: apixaban (ELIQUIS) tablet 2.5 mg Start: 12/29/20 2200 apixaban (ELIQUIS) tablet 2.5 mg    Code Status: DNR  Family Communication:    Status is: Inpatient  Remains inpatient appropriate because: Atrial fibrillation with RVR, heart failure, amiodarone drip    Consultants: Cardiology  Procedures: None  Anti-infectives:  None  Anti-infectives (From admission, onward)    None       Subjective: Today, patient was seen and examined at bedside.  Patient denies any chest pain, dizziness, lightheadedness or shortness of breath.  Earlier in the day she felt like congested in her lungs and had mild chest pressure which improved with nitroglycerin  Objective: Vitals:   12/30/20 0658 12/30/20 0700  BP: (!) 165/104 (!) 162/107  Pulse:    Resp: (!) 23 20  Temp:    SpO2:  95%    Intake/Output Summary (Last 24 hours) at 12/30/2020 0724 Last data filed at 12/30/2020 0345 Gross per 24 hour  Intake 331.65 ml  Output --  Net 331.65 ml   Filed Weights   12/29/20 1227  Weight: 54.4 kg   Body mass index is 19.37 kg/m.   Physical Exam: GENERAL: Patient is alert awake and oriented. Not in obvious distress.  Thinly built nasal cannula oxygen HENT: No scleral pallor or icterus. Pupils equally reactive to light. Oral mucosa is moist NECK: is supple, no gross swelling noted. CHEST:.  Diminished breath sounds bilaterally.  Pacemaker in place.  Mild basal crackles noted. CVS: S1 and S2 heard, no murmur.  Irregular rhythm ABDOMEN: Soft, non-tender, bowel sounds are present. EXTREMITIES: No edema. CNS: Cranial nerves are intact. No focal motor deficits. SKIN: warm and dry without rashes.  Data Review: I have personally reviewed the following laboratory data and studies,  CBC: Recent Labs  Lab 12/29/20 1237  WBC 7.9  HGB 12.2  HCT 37.9  MCV 97.4  PLT 324   Basic Metabolic Panel: Recent Labs  Lab 12/29/20 1237  NA 135  K 4.7  CL 106  CO2 21*  GLUCOSE 131*  BUN 30*  CREATININE 0.87  CALCIUM 9.4  MG 2.4   Liver Function Tests: No results for input(s): AST, ALT, ALKPHOS, BILITOT, PROT, ALBUMIN in the last 168 hours. No results for input(s): LIPASE, AMYLASE in the last 168 hours. No results for input(s): AMMONIA in the last  168 hours. Cardiac Enzymes: No results for input(s): CKTOTAL, CKMB, CKMBINDEX, TROPONINI in the last 168 hours. BNP (last 3 results) Recent Labs    05/17/20 1146 12/29/20 1237  BNP 371.0* 1,365.8*    ProBNP (last 3 results) No results for input(s): PROBNP in the last 8760 hours.  CBG: No results for input(s): GLUCAP in the last 168 hours. Recent Results (from the past 240 hour(s))  Resp Panel by RT-PCR (Flu A&B, Covid) Nasopharyngeal Swab     Status: None   Collection Time: 12/29/20 11:09 PM   Specimen: Nasopharyngeal Swab; Nasopharyngeal(NP) swabs in vial transport medium  Result Value Ref Range Status   SARS Coronavirus 2 by RT PCR NEGATIVE NEGATIVE Final    Comment: (NOTE) SARS-CoV-2 target nucleic acids are NOT DETECTED.  The SARS-CoV-2 RNA is generally detectable in upper respiratory specimens during the acute phase of infection. The lowest concentration of SARS-CoV-2 viral copies this assay can detect is 138 copies/mL. A negative result does not preclude SARS-Cov-2 infection and should not be used as the sole basis for treatment or other patient management decisions. A negative result may occur with  improper specimen collection/handling, submission of specimen other than nasopharyngeal swab, presence of viral mutation(s) within the areas targeted by this assay, and inadequate number of viral copies(<138 copies/mL). A negative result must be combined with clinical observations, patient history, and epidemiological information. The expected result is Negative.  Fact Sheet for Patients:  EntrepreneurPulse.com.au  Fact Sheet for Healthcare Providers:  IncredibleEmployment.be  This test is no t yet approved or cleared by the [place] FDA and  has been authorized for detection and/or diagnosis of SARS-CoV-2 by FDA under an Emergency Use Authorization (EUA). This EUA will remain  in effect (meaning this test can be used) for the  duration of the COVID-19 declaration under Section 564(b)(1) of the Act, 21 U.S.C.section 360bbb-3(b)(1), unless the authorization is terminated  or revoked sooner.       Influenza A by PCR NEGATIVE NEGATIVE Final   Influenza B by PCR NEGATIVE NEGATIVE Final    Comment: (NOTE) The Xpert Xpress SARS-CoV-2/FLU/RSV plus assay is intended as an aid in the diagnosis of influenza from Nasopharyngeal swab specimens and should not be used as a sole basis for treatment. Nasal washings and aspirates are unacceptable for Xpert Xpress SARS-CoV-2/FLU/RSV testing.  Fact Sheet for Patients: EntrepreneurPulse.com.au  Fact Sheet for Healthcare Providers: IncredibleEmployment.be  This test is not yet approved or cleared by the [place] FDA and has been authorized for detection and/or diagnosis of SARS-CoV-2 by FDA under an Emergency Use Authorization (EUA). This EUA will remain in effect (meaning this test can be used) for the duration of the COVID-19 declaration under Section 564(b)(1) of the Act, 21 U.S.C. section 360bbb-3(b)(1), unless the authorization is terminated or revoked.  Performed at [facility], [address], [place], [place] [address]      Studies: DG [facility] 1 View  Result Date: 12/29/2020 CLINICAL DATA:  Chest pain EXAM: PORTABLE CHEST 1 VIEW COMPARISON:  Chest radiograph dated May 17, 2020 FINDINGS: The heart is mildly enlarged. Pacemaker leads terminating in the right atrium and right ventricle. Hyperinflated lungs. Bibasilar atelectasis.  No acute osseous abnormality. IMPRESSION: 1.  Stable cardiomegaly. 2. COPD with bibasilar atelectasis. No focal consolidation or large pleural effusion. Electronically Signed   By: Keane Police D.O.   On: 12/29/2020 13:15      Flora Lipps, MD  [facility] 12/30/2020  If 7PM-7AM, please contact night-coverage

## 2020-12-30 NOTE — Plan of Care (Signed)

## 2020-12-30 NOTE — Progress Notes (Signed)
Progress Note  Patient Name: Alejandra Wiley Date of Encounter: 12/30/2020  Primary Cardiologist: [name], MD  Subjective   Converted to sinus rhythm in ED.  A paced, V sensed this AM w/ occas AV pacing.  Pt says that she developed "a rattling" sensation in her chest around 3-4 AM - felt like she couldn't get a satisfying breath and became nervous.  Hit call bell several times w/o response.  Noticed chest pressure - mild.  Eventually received 3 sl NTG w/ relief of all Ss sometime around 6:30 or 7 AM.  Currently symptom free.  Inpatient Medications    Scheduled Meds:  amiodarone  200 mg Oral BID   apixaban  2.5 mg Oral BID   multivitamin with minerals  1 tablet Oral Daily   nadolol  60 mg Oral BID   Continuous Infusions:   PRN Meds: acetaminophen, menthol-cetylpyridinium, nitroGLYCERIN, ondansetron (ZOFRAN) IV   Vital Signs    Vitals:   12/30/20 0720 12/30/20 0806 12/30/20 1124 12/30/20 1517  BP: (!) 158/93 (!) 152/90 (!) 124/110 133/87  Pulse:  82 83 88  Resp: (!) 21 20 18 18   Temp:  98.1 F (36.7 C) 98 F (36.7 C) 97.8 F (36.6 C)  TempSrc:  Oral Oral Oral  SpO2:  95% 96% 92%  Weight:      Height:        Intake/Output Summary (Last 24 hours) at 12/30/2020 1537 Last data filed at 12/30/2020 1518 Gross per 24 hour  Intake 331.65 ml  Output 1575 ml  Net -1243.35 ml   Filed Weights   12/29/20 1227  Weight: 54.4 kg    Physical Exam   GEN: Thin, frail, in no acute distress.  HEENT: Grossly normal.  Neck: Supple, no JVD, carotid bruits, or masses. Cardiac: RRR, no murmurs, rubs, or gallops. No clubbing, cyanosis, edema.  Radials 2+, DP/PT 2+ and equal bilaterally.  Respiratory:  Respirations regular and unlabored, bibasilar crackles. GI: Soft, nontender, nondistended, BS + x 4. MS: no deformity or atrophy. Skin: warm and dry, no rash. Neuro:  Strength and sensation are intact. Psych: AAOx3.  Normal affect.  Labs    Chemistry Recent Labs  Lab  12/29/20 1237 12/30/20 0632  NA 135 132*  K 4.7 4.2  CL 106 104  CO2 21* 21*  GLUCOSE 131* 145*  BUN 30* 28*  CREATININE 0.87 0.88  CALCIUM 9.4 8.9  GFRNONAA >60 >60  ANIONGAP 8 7     Hematology Recent Labs  Lab 12/29/20 1237 12/30/20 0632  WBC 7.9 8.0  RBC 3.89 3.99  HGB 12.2 12.5  HCT 37.9 37.9  MCV 97.4 95.0  MCH 31.4 31.3  MCHC 32.2 33.0  RDW 13.8 13.9  PLT 236 224    Cardiac Enzymes  Recent Labs  Lab 12/29/20 1237 12/29/20 1519 12/30/20 0632 12/30/20 0922  TROPONINIHS 16 18* 21* 23*      BNP Recent Labs  Lab 12/29/20 1237  BNP 1,365.8*    Lipids  Lab Results  Component Value Date   CHOL 157 02/10/2018   HDL 62 02/10/2018   LDLCALC 83 02/10/2018   TRIG 61 02/10/2018   CHOLHDL 2.5 02/10/2018    HbA1c  Lab Results  Component Value Date   HGBA1C 6.7 (H) 02/10/2018    Radiology    DG Chest Port 1 View  Result Date: 12/29/2020 CLINICAL DATA:  Chest pain EXAM: PORTABLE CHEST 1 VIEW COMPARISON:  Chest radiograph dated May 17, 2020 FINDINGS: The heart  is mildly enlarged. Pacemaker leads terminating in the right atrium and right ventricle. Hyperinflated lungs. Bibasilar atelectasis. No acute osseous abnormality. IMPRESSION: 1.  Stable cardiomegaly. 2. COPD with bibasilar atelectasis. No focal consolidation or large pleural effusion. Electronically Signed   By: Keane Police D.O.   On: 12/29/2020 13:15    Telemetry    A paced, V sensed, occas AV pacing - Personally Reviewed  ECG    A pacing, V sensing, 1st deg avb, LBBB - Personally Reviewed  Cardiac Studies   2D Echocardiogram 5.2022   1. Left ventricular ejection fraction, by estimation, is 50%. The left  ventricle has low normal function. The left ventricle has no regional wall  motion abnormalities. There is mild left ventricular hypertrophy. Left  ventricular diastolic parameters are   consistent with Grade II diastolic dysfunction (pseudonormalization).   2. Right ventricular  systolic function is low normal. The right  ventricular size is normal.   3. Left atrial size was moderately dilated.   4. The mitral valve is normal in structure. Mild to moderate mitral valve  regurgitation.   5. The aortic valve is tricuspid. Aortic valve regurgitation is not  visualized.   6. The inferior vena cava is normal in size with greater than 50%  respiratory variability, suggesting right atrial pressure of 3 mmHg.   Patient Profile     87 y.o. female w/ a h/o PAF, SSS s/p PPM, chronic combined syst/diast CHF (EF 50% 05/2020), ? CVA, HTN, and COVID infxn (09/2020), who was admitted from the office on 12/12 secondary to rapid afib and chest pain.  Converted to sinus in ED on IV amio.  Assessment & Plan    1.  Precordial chest pain: Patient seen in cardiology clinic on December 12 with complaints of chest pain and findings of rapid atrial fibrillation.  She converted to sinus rhythm in the emergency department.  HsTrop initially 16  18.  She had recurrent chest discomfort earlier this morning x3 to 4 hours and f/u HsTrops only minimally higher at 21  23.  We have arranged for a Lexiscan Myoview today.  If nonischemic, can likely be discharged home late this afternoon.  2.  PAF: Presented to ED with rapid atrial fibrillation and chest pain.  Converted to sinus rhythm on IV amiodarone.  We have switched amiodarone to 200 mg orally twice daily.  Recommend continuation of this dose x2 weeks, at which point we can reduce to 200 mg daily in the outpatient setting.  She remains anticoagulated with reduced dose Eliquis therapy in the setting of advanced age and weight less than 60 kg.  3.  Acute on chronic combined syst/diast CHF:  In setting of rapid afib, Pt w/ dyspnea and BNP of 1365 on admission.  CXR w/o CHF.  Noted rattling in her chest and dyspnea this AM - persisted x several hrs before receiving sl ntg as outlined above.  Currently w/o complaints but remains on O2.  Bibasilar crackles on  exam.  We gave her a dose of Lasix 1 mg IV earlier today.  4.  Essential HTN: Higher this morning but currently improved.  Signed, Murray Hodgkins, NP  12/30/2020, 3:37 PM    For questions or updates, please contact   Please consult [email or web address] for contact info under Cardiology/STEMI.

## 2020-12-30 NOTE — Progress Notes (Addendum)
Pt c/o chest pressure and SOB. 2L ox applied with min relief, EKG in progress, MD paged. Chest Pressure 7/10, no pain   Secure chat:  pt is c/o chest pressure, on Amio gtt, placed on 2L O2, lung sounds changed from clear to course and crackles.   obtaining 12 lead EKG now, no orders for Nitro tabs or nebs in place. has hx of CHF,   Standing orders activated

## 2020-12-31 ENCOUNTER — Inpatient Hospital Stay (HOSPITAL_COMMUNITY)
Admit: 2020-12-31 | Discharge: 2020-12-31 | Disposition: A | Discharge status: Home / Self Care | Payer: Medicare HMO | Attending: Nurse Practitioner | Admitting: Nurse Practitioner

## 2020-12-31 DIAGNOSIS — I4891 Unspecified atrial fibrillation: Secondary | ICD-10-CM | POA: Diagnosis not present

## 2020-12-31 DIAGNOSIS — I5032 Chronic diastolic (congestive) heart failure: Secondary | ICD-10-CM

## 2020-12-31 DIAGNOSIS — R0609 Other forms of dyspnea: Secondary | ICD-10-CM | POA: Diagnosis not present

## 2020-12-31 LAB — ECHOCARDIOGRAM COMPLETE
AR max vel: 2.77 cm2
AV Area VTI: 2.45 cm2
AV Area mean vel: 2.45 cm2
AV Mean grad: 2 mmHg
AV Peak grad: 3.5 mmHg
Ao pk vel: 0.94 m/s
Area-P 1/2: 4.83 cm2
Height: 66 in
S' Lateral: 2.67 cm
Weight: 1920 oz

## 2020-12-31 MED ORDER — FUROSEMIDE 40 MG PO TABS: 40.0000 mg | ORAL_TABLET | Freq: Every day | ORAL | 2 refills | Status: DC

## 2020-12-31 MED ORDER — AMIODARONE HCL 200 MG PO TABS: 200.0000 mg | ORAL_TABLET | Freq: Two times a day (BID) | ORAL | 0 refills | Status: DC

## 2020-12-31 MED ORDER — FUROSEMIDE 10 MG/ML IJ SOLN
20.0000 mg | Freq: Every day | INTRAMUSCULAR | Status: DC
  Administered 2020-12-31: 10:00:00 20 mg via INTRAVENOUS
  Filled 2020-12-31: qty 2

## 2020-12-31 MED ORDER — METOPROLOL SUCCINATE ER 50 MG PO TB24: 50.0000 mg | ORAL_TABLET | Freq: Every day | ORAL | Status: DC

## 2020-12-31 MED ORDER — METOPROLOL SUCCINATE ER 50 MG PO TB24: 50.0000 mg | ORAL_TABLET | Freq: Every day | ORAL | 0 refills | Status: DC

## 2020-12-31 NOTE — Progress Notes (Signed)
PROGRESS NOTE    Alejandra Wiley  NTI:[id number] DOB: 1932-03-31 DOA: 12/29/2020 PCP: Kirk Ruths, MD    Brief Narrative:  87 year old female with history of paroxysmal A. fib, chronic combined heart failure, hypertension, status post pacemaker for sick sinus syndrome admitted with 3 days of chest pressure sensation, palpitation and shortness of breath.  In the emergency room she was found with A. fib with RVR, left bundle branch block.  He started on amiodarone drip and admitted to the hospital.   Assessment & Plan:   Principal Problem:   Atrial fibrillation with rapid ventricular response (HCC) Active Problems:   Chronic diastolic heart failure (HCC)   Hypertension   Acute HFrEF (heart failure with reduced ejection fraction) (HCC)  Paroxysmal A. fib with RVR: Initially treated with amiodarone drip and converted to oral amiodarone.  On nadolol.  Eliquis for anticoagulation.  Fairly rate controlled today.  Acute on chronic combined heart failure: Patient was found with worsening ejection fraction and heart failure symptoms.  Treated with IV Lasix with good clinical response.  Lexiscan with ejection fraction less than 30%.  Echocardiogram today.  Results pending.  Continue IV diuretics.  Patient is on beta-blockers.  Essential hypertension Home blood pressure fairly stable.   DVT prophylaxis: apixaban (ELIQUIS) tablet 2.5 mg Start: 12/29/20 2200 apixaban (ELIQUIS) tablet 2.5 mg   Code Status: DNR Family Communication: None Disposition Plan: Status is: Inpatient  Remains inpatient appropriate because: Using treatment for heart failure, IV diuresis and close monitoring.         Consultants:  Cardiology  Procedures:  None  Antimicrobials:  None   Subjective: Patient seen and examined.  She is eager to go home.  She is missing her fianc and looking forward for their wedding day. She tells me her breathing is much better now.  She is on room air. Heart  rate is mostly controlled.  Occasional 105-110 but no palpitations.  Objective: Vitals:   12/31/20 0001 12/31/20 0335 12/31/20 0911 12/31/20 1155  BP: (!) 118/55 (!) 135/91 (!) 147/85 (!) 141/93  Pulse: 81  (!) 105 75  Resp: 18 17 16 15   Temp: 98.8 F (37.1 C) 97.6 F (36.4 C) (!) 97.5 F (36.4 C) 97.8 F (36.6 C)  TempSrc: Oral Oral    SpO2: 92% (!) 3% 94% 96%  Weight:      Height:        Intake/Output Summary (Last 24 hours) at 12/31/2020 1452 Last data filed at 12/31/2020 1404 Gross per 24 hour  Intake 960 ml  Output 676 ml  Net 284 ml   Filed Weights   12/29/20 1227  Weight: 54.4 kg    Examination:  General exam: Appears calm and comfortable  Mostly on room air.  Talkative. Respiratory system: Clear to auscultation. Respiratory effort normal.  No added sounds. Cardiovascular system: S1 & S2 heard, RRR.  Tachycardic.  No edema.  Pacemaker in place. Gastrointestinal system: Abdomen is nondistended, soft and nontender. No organomegaly or masses felt. Normal bowel sounds heard. Central nervous system: Alert and oriented. No focal neurological deficits. Extremities: Symmetric 5 x 5 power. Skin: No rashes, lesions or ulcers Psychiatry: Judgement and insight appear normal. Mood & affect appropriate.     Data Reviewed: I have personally reviewed following labs and imaging studies  CBC: Recent Labs  Lab 12/29/20 1237 12/30/20 0632  WBC 7.9 8.0  HGB 12.2 12.5  HCT 37.9 37.9  MCV 97.4 95.0  PLT 236 867   Basic Metabolic  Panel: Recent Labs  Lab 12/29/20 1237 12/30/20 0632  NA 135 132*  K 4.7 4.2  CL 106 104  CO2 21* 21*  GLUCOSE 131* 145*  BUN 30* 28*  CREATININE 0.87 0.88  CALCIUM 9.4 8.9  MG 2.4  --    GFR: Estimated Creatinine Clearance: 39.4 mL/min (by C-G formula based on SCr of 0.88 mg/dL). Liver Function Tests: No results for input(s): AST, ALT, ALKPHOS, BILITOT, PROT, ALBUMIN in the last 168 hours. No results for input(s): LIPASE, AMYLASE  in the last 168 hours. No results for input(s): AMMONIA in the last 168 hours. Coagulation Profile: No results for input(s): INR, PROTIME in the last 168 hours. Cardiac Enzymes: No results for input(s): CKTOTAL, CKMB, CKMBINDEX, TROPONINI in the last 168 hours. BNP (last 3 results) No results for input(s): PROBNP in the last 8760 hours. HbA1C: No results for input(s): HGBA1C in the last 72 hours. CBG: No results for input(s): GLUCAP in the last 168 hours. Lipid Profile: No results for input(s): CHOL, HDL, LDLCALC, TRIG, CHOLHDL, LDLDIRECT in the last 72 hours. Thyroid Function Tests: No results for input(s): TSH, T4TOTAL, FREET4, T3FREE, THYROIDAB in the last 72 hours. Anemia Panel: No results for input(s): VITAMINB12, FOLATE, FERRITIN, TIBC, IRON, RETICCTPCT in the last 72 hours. Sepsis Labs: No results for input(s): PROCALCITON, LATICACIDVEN in the last 168 hours.  Recent Results (from the past 240 hour(s))  Resp Panel by RT-PCR (Flu A&B, Covid) Nasopharyngeal Swab     Status: None   Collection Time: 12/29/20 11:09 PM   Specimen: Nasopharyngeal Swab; Nasopharyngeal(NP) swabs in vial transport medium  Result Value Ref Range Status   SARS Coronavirus 2 by RT PCR NEGATIVE NEGATIVE Final    Comment: (NOTE) SARS-CoV-2 target nucleic acids are NOT DETECTED.  The SARS-CoV-2 RNA is generally detectable in upper respiratory specimens during the acute phase of infection. The lowest concentration of SARS-CoV-2 viral copies this assay can detect is 138 copies/mL. A negative result does not preclude SARS-Cov-2 infection and should not be used as the sole basis for treatment or other patient management decisions. A negative result may occur with  improper specimen collection/handling, submission of specimen other than nasopharyngeal swab, presence of viral mutation(s) within the areas targeted by this assay, and inadequate number of viral copies(<138 copies/mL). A negative result must be  combined with clinical observations, patient history, and epidemiological information. The expected result is Negative.  Fact Sheet for Patients:  EntrepreneurPulse.com.au  Fact Sheet for Healthcare Providers:  IncredibleEmployment.be  This test is no t yet approved or cleared by the [place] FDA and  has been authorized for detection and/or diagnosis of SARS-CoV-2 by FDA under an Emergency Use Authorization (EUA). This EUA will remain  in effect (meaning this test can be used) for the duration of the COVID-19 declaration under Section 564(b)(1) of the Act, 21 U.S.C.section 360bbb-3(b)(1), unless the authorization is terminated  or revoked sooner.       Influenza A by PCR NEGATIVE NEGATIVE Final   Influenza B by PCR NEGATIVE NEGATIVE Final    Comment: (NOTE) The Xpert Xpress SARS-CoV-2/FLU/RSV plus assay is intended as an aid in the diagnosis of influenza from Nasopharyngeal swab specimens and should not be used as a sole basis for treatment. Nasal washings and aspirates are unacceptable for Xpert Xpress SARS-CoV-2/FLU/RSV testing.  Fact Sheet for Patients: EntrepreneurPulse.com.au  Fact Sheet for Healthcare Providers: IncredibleEmployment.be  This test is not yet approved or cleared by the [place] and has been authorized  for detection and/or diagnosis of SARS-CoV-2 by FDA under an Emergency Use Authorization (EUA). This EUA will remain in effect (meaning this test can be used) for the duration of the COVID-19 declaration under Section 564(b)(1) of the Act, 21 U.S.C. section 360bbb-3(b)(1), unless the authorization is terminated or revoked.  Performed at [facility], [address] [place], [place] [address]          Radiology Studies: NM Myocar Multi W/Spect W/Wall Motion / EF  Result Date: 12/30/2020   Abnormal, potentially high risk pharmacologic myocardial  perfusion stress test.   There is a large in size, moderate in severity, fixed defect involving the septum most likely due to LBBB but cannot exclude scar.   Left ventricular systolic function is severely reduced (LVEF < 30%) with global hypokinesis.  Recommend echocardiographic correlation.   There is no significant coronary artery calcification.  Dual chamber pacing leads are noted.   Aortic atherosclerosis is noted as well as small-moderate bilateral pleural effusion (right > left).        Scheduled Meds:  amiodarone  200 mg Oral BID   apixaban  2.5 mg Oral BID   furosemide  20 mg Intravenous Daily   multivitamin with minerals  1 tablet Oral Daily   nadolol  60 mg Oral BID   Continuous Infusions:   LOS: 2 days    Time spent: 30 minutes    Barb Merino, MD [facility] Pager [phone]

## 2020-12-31 NOTE — Progress Notes (Signed)
*  PRELIMINARY RESULTS* Echocardiogram 2D Echocardiogram has been performed.  Alejandra Wiley 12/31/2020, 1:36 PM

## 2020-12-31 NOTE — Progress Notes (Signed)
Progress Note  Patient Name: Alejandra Wiley Date of Encounter: 12/31/2020  [facility] Cardiologist: [name], MD   Subjective   Denies palpitations, denies chest pain or shortness of breath.  Echocardiogram Scheduled to be performed later today.  Inpatient Medications    Scheduled Meds:  amiodarone  200 mg Oral BID   apixaban  2.5 mg Oral BID   furosemide  20 mg Intravenous Daily   multivitamin with minerals  1 tablet Oral Daily   nadolol  60 mg Oral BID   Continuous Infusions:  PRN Meds: acetaminophen, menthol-cetylpyridinium, nitroGLYCERIN, ondansetron (ZOFRAN) IV   Vital Signs    Vitals:   12/31/20 0001 12/31/20 0335 12/31/20 0911 12/31/20 1155  BP: (!) 118/55 (!) 135/91 (!) 147/85 (!) 141/93  Pulse: 81  (!) 105 75  Resp: 18 17 16 15   Temp: 98.8 F (37.1 C) 97.6 F (36.4 C) (!) 97.5 F (36.4 C) 97.8 F (36.6 C)  TempSrc: Oral Oral    SpO2: 92% (!) 3% 94% 96%  Weight:      Height:        Intake/Output Summary (Last 24 hours) at 12/31/2020 1531 Last data filed at 12/31/2020 1404 Gross per 24 hour  Intake 960 ml  Output 301 ml  Net 659 ml   Last 3 Weights 12/29/2020 12/29/2020 12/16/2020  Weight (lbs) 120 lb 125 lb 6 oz 123 lb  Weight (kg) 54.432 kg 56.87 kg 55.792 kg      Telemetry    Atrial flutter heart rate 90- Personally Reviewed  ECG     - Personally Reviewed  Physical Exam   GEN: No acute distress.   Neck: No JVD Cardiac: Tachycardic, regular Respiratory: Clear to auscultation bilaterally. GI: Soft, nontender, non-distended  MS: No edema; No deformity. Neuro:  Nonfocal  Psych: Normal affect   Labs    High Sensitivity Troponin:   Recent Labs  Lab 12/29/20 1237 12/29/20 1519 12/30/20 0632 12/30/20 0922  TROPONINIHS 16 18* 21* 23*     Chemistry Recent Labs  Lab 12/29/20 1237 12/30/20 0632  NA 135 132*  K 4.7 4.2  CL 106 104  CO2 21* 21*  GLUCOSE 131* 145*  BUN 30* 28*  CREATININE 0.87 0.88  CALCIUM 9.4  8.9  MG 2.4  --   GFRNONAA >60 >60  ANIONGAP 8 7    Lipids No results for input(s): CHOL, TRIG, HDL, LABVLDL, LDLCALC, CHOLHDL in the last 168 hours.  Hematology Recent Labs  Lab 12/29/20 1237 12/30/20 0632  WBC 7.9 8.0  RBC 3.89 3.99  HGB 12.2 12.5  HCT 37.9 37.9  MCV 97.4 95.0  MCH 31.4 31.3  MCHC 32.2 33.0  RDW 13.8 13.9  PLT 236 224   Thyroid No results for input(s): TSH, FREET4 in the last 168 hours.  BNP Recent Labs  Lab 12/29/20 1237  BNP 1,365.8*    DDimer No results for input(s): DDIMER in the last 168 hours.   Radiology    NM Myocar Multi W/Spect W/Wall Motion / EF  Result Date: 12/30/2020   Abnormal, potentially high risk pharmacologic myocardial perfusion stress test.   There is a large in size, moderate in severity, fixed defect involving the septum most likely due to LBBB but cannot exclude scar.   Left ventricular systolic function is severely reduced (LVEF < 30%) with global hypokinesis.  Recommend echocardiographic correlation.   There is no significant coronary artery calcification.  Dual chamber pacing leads are noted.   Aortic atherosclerosis  is noted as well as small-moderate bilateral pleural effusion (right > left).    Cardiac Studies   Echocardiogram with mildly reduced ejection fraction EF 45%.  Patient Profile     87 y.o. female with history of sick sinus syndrome s/p permanent pacemaker, paroxysmal atrial fibrillation, presenting with palpitations diagnosed with A. fib RVR.  Assessment & Plan    .atrial Flutter/A. fib RVR -Currently in atrial flutter, heart rate around 107 -Asymptomatic with improvement in heart rates. -Continue Eliquis, amiodarone. -Stop nadolol, startToprol-XL.   2.  Echo with mildly reduced EF 45%. -Possibly tachycardia induced -Shortness of breath improved with Lasix. -Toprol-XL 50mg  qd, Lasix 20 mg daily.  Total encounter time 35 minutes  Greater than 50% was spent in counseling and coordination of care with  the patient     Signed, Kate Sable, MD  12/31/2020, 3:31 PM

## 2020-12-31 NOTE — Evaluation (Signed)
Physical Therapy Evaluation Patient Details Name: Alejandra Wiley MRN: [id number] DOB: 1932-02-01 Today's Date: 12/31/2020  History of Present Illness  Alejandra Wiley is a 87 y.o. female with medical history significant for paroxysmal atrial fibrillation, chronic combined systolic and diastolic dysfunction CHF with last known LVEF of 50%, hypertension, status post pacemaker insertion for sick sinus syndrome who presents to the ER for evaluation of a 3-day history of chest pressure associated with palpitations and shortness of breath.   Clinical Impression  Patient received in bed, she is pleasant and agreeable to PT assessment. Patient reports she is active and independent at baseline. She is getting married in 1 month. Patient is independent with bed mobility and transfers. She ambulated 200 feet without AD. Slight drifting with ambulation requiring min guard assist at times for safety. She will continue to benefit from skilled PT while here to improve safety with mobility and improve strength.        Recommendations for follow up therapy are one component of a multi-disciplinary discharge planning process, led by the attending physician.  Recommendations may be updated based on patient status, additional functional criteria and insurance authorization.  Follow Up Recommendations No PT follow up    Assistance Recommended at Discharge None  Functional Status Assessment Patient has had a recent decline in their functional status and demonstrates the ability to make significant improvements in function in a reasonable and predictable amount of time.  Equipment Recommendations  None recommended by PT    Recommendations for Other Services       Precautions / Restrictions Precautions Precaution Comments: mod fall Restrictions Weight Bearing Restrictions: No      Mobility  Bed Mobility Overal bed mobility: Independent                  Transfers Overall transfer level:  Independent Equipment used: None                    Ambulation/Gait Ambulation/Gait assistance: Min guard Gait Distance (Feet): 200 Feet Assistive device: None Gait Pattern/deviations: Step-through pattern;Drifts right/left Gait velocity: WNL     General Gait Details: patient generally steady, min guard.  Stairs            Wheelchair Mobility    Modified Rankin (Stroke Patients Only)       Balance Overall balance assessment: Mild deficits observed, not formally tested                                           Pertinent Vitals/Pain Pain Assessment: No/denies pain    Home Living Family/patient expects to be discharged to:: Private residence Living Arrangements: Alone;Spouse/significant other   Type of Home: House Home Access: Level entry       Home Layout: One level Home Equipment: [place] - single Barista (2 wheels) Additional Comments: Patient is independent and driving at baseline    Prior Function Prior Level of Function : Independent/Modified Independent             Mobility Comments: independent, takes Yoga, drives ADLs Comments: independent     Hand Dominance        Extremity/Trunk Assessment   Upper Extremity Assessment Upper Extremity Assessment: Overall WFL for tasks assessed    Lower Extremity Assessment Lower Extremity Assessment: Overall WFL for tasks assessed    Cervical / Trunk Assessment Cervical / Trunk Assessment:  Normal  Communication   Communication: No difficulties  Cognition Arousal/Alertness: Awake/alert Behavior During Therapy: WFL for tasks assessed/performed Overall Cognitive Status: Within Functional Limits for tasks assessed                                          General Comments      Exercises     Assessment/Plan    PT Assessment Patient needs continued PT services  PT Problem List Decreased strength;Decreased mobility;Decreased balance        PT Treatment Interventions Gait training;Therapeutic exercise;Balance training;Patient/family education;Therapeutic activities    PT Goals (Current goals can be found in the Care Plan section)  Acute Rehab PT Goals Patient Stated Goal: to return home, get stronger PT Goal Formulation: With patient Time For Goal Achievement: 01/07/21 Potential to Achieve Goals: Good    Frequency Min 2X/week   Barriers to discharge Decreased caregiver support      Co-evaluation               AM-PAC PT "6 Clicks" Mobility  Outcome Measure Help needed turning from your back to your side while in a flat bed without using bedrails?: None Help needed moving from lying on your back to sitting on the side of a flat bed without using bedrails?: None Help needed moving to and from a bed to a chair (including a wheelchair)?: None Help needed standing up from a chair using your arms (e.g., wheelchair or bedside chair)?: None Help needed to walk in hospital room?: A Little Help needed climbing 3-5 steps with a railing? : A Little 6 Click Score: 22    End of Session   Activity Tolerance: Patient tolerated treatment well Patient left: in bed;with call bell/phone within reach Nurse Communication: Mobility status PT Visit Diagnosis: Unsteadiness on feet (R26.81);Muscle weakness (generalized) (M62.81);Difficulty in walking, not elsewhere classified (R26.2)    Time: 0911-0950 PT Time Calculation (min) (ACUTE ONLY): 39 min   Charges:   PT Evaluation $PT Eval Low Complexity: 1 Low PT Treatments $Gait Training: 8-22 mins        Kellina Dreese, PT, GCS 12/31/20,10:53 AM

## 2020-12-31 NOTE — Discharge Summary (Signed)
Physician Discharge Summary  Alejandra Wiley CWC:[id number] DOB: 1932/08/05 DOA: 12/29/2020  PCP: Kirk Ruths, MD  Admit date: 12/29/2020 Discharge date: 12/31/2020  Admitted From: Home Disposition: Home  Recommendations for Outpatient Follow-up:  Follow up with PCP in 1-2 weeks Schedule follow-up with cardiology  Home Health: N/A Equipment/Devices: N/A  Discharge Condition: Stable CODE STATUS: DNR Diet recommendation: Low-salt diet.  Fluid restriction less than 1500 mL in a day.  Discharge summary:  87 year old with history of paroxysmal A. fib on nadolol and Eliquis, chronic combined heart failure, hypertension, sick sinus syndrome with pacemaker in place admitted with 3 days of chest pressure sensation, palpitation and shortness of breath.  She was found with A. fib with RVR and fluid overload.  She was admitted to the hospital treated with amiodarone infusion and IV diuresis with good clinical response.  Paroxysmal A. fib with RVR: Initially treated with amiodarone drip and converted to oral amiodarone.  Nadolol was stopped and started on Toprol-XL.  A flutter but rate controlled.  Therapeutic on Eliquis.  Mobilized with good rate control and discharged on Toprol-XL and amiodarone with plan to do outpatient follow-up.  Acute on chronic combined heart failure: Patient was found with worsening ejection fraction.  Evidence of heart failure.  Good response to diuresis.  Repeat echocardiogram today with EF 45%.  This was thought to be tachycardia induced decreased ejection fraction from previous EF of 50%.  Since already responded well with IV diuresis, changed to oral Lasix 40 mg daily, she is on Toprol-XL.  Blood pressures are stable.   Discharge Diagnoses:  Principal Problem:   Atrial fibrillation with rapid ventricular response (HCC) Active Problems:   Chronic diastolic heart failure (HCC)   Hypertension   Acute HFrEF (heart failure with reduced ejection fraction)  [facility])    Discharge Instructions  Discharge Instructions     Amb referral to AFIB Clinic   Complete by: As directed    Call MD for:  difficulty breathing, headache or visual disturbances   Complete by: As directed    Diet - low sodium heart healthy   Complete by: As directed    Fluid intake less than 1500 ml a day   Increase activity slowly   Complete by: As directed       Allergies as of 12/31/2020       Reactions   Latex Other (See Comments)   blisters   Other Other (See Comments)   Blisters/ paper tape is Market researcher paper tape is Market researcher paper tape is Market researcher paper tape is Production manager Other (See Comments)   Blisters/ paper tape is Market researcher paper tape is Ok   Xarelto [rivaroxaban] Other (See Comments)   Gums bleeding and too much bruising   Nickel Rash, Other (See Comments)        Medication List     STOP taking these medications    nadolol 20 MG tablet Commonly known as: CORGARD       TAKE these medications    acetaminophen 500 MG tablet Commonly known as: TYLENOL Take 500 mg by mouth every 8 (eight) hours as needed for mild pain or headache.   amiodarone 200 MG tablet Commonly known as: PACERONE Take 1 tablet (200 mg total) by mouth 2 (two) times daily.   Eliquis 2.5 MG Tabs tablet Generic drug: apixaban TAKE 1 TABLET TWICE DAILY   fluticasone 50 MCG/ACT nasal spray Commonly known as: FLONASE Place 2 sprays into both nostrils daily  as needed for allergies or rhinitis.   furosemide 40 MG tablet Commonly known as: Lasix Take 1 tablet (40 mg total) by mouth daily. What changed:  when to take this reasons to take this   ibandronate 150 MG tablet Commonly known as: BONIVA Take 150 mg by mouth every 30 (thirty) days. Take in the morning with a full glass of water, on an empty stomach, and do not take anything else by mouth or lie down for the next 30 min.   metoprolol succinate 50 MG 24 hr tablet Commonly known as:  TOPROL-XL Take 1 tablet (50 mg total) by mouth daily. Take with or immediately following a meal. Start taking on: January 01, 2021   potassium chloride SA 20 MEQ tablet Commonly known as: KLOR-CON M TAKE 1/2 TABLET TWICE DAILY (EVERY MORNING AND EVERY EVENING) What changed: See the new instructions.   SENIOR MULTIVITAMIN PLUS PO Take 1 tablet by mouth daily.   ZINC PO Take 1 tablet by mouth daily.        Follow-up Information     Kirk Ruths, MD Follow up in 1 week(s).   Specialty: Internal Medicine Contact information: [place] [address] [phone]         Deboraha Sprang, MD .   Specialty: Cardiology Contact information: [address] [place] [address] [phone]         Deboraha Sprang, MD .   Specialty: Cardiology Contact information: [place] [address] [phone]                Allergies  Allergen Reactions   Latex Other (See Comments)    blisters   Other Other (See Comments)    Blisters/ paper tape is Market researcher paper tape is Market researcher paper tape is Clinical research associate paper tape is Production manager Other (See Comments)    Blisters/ paper tape is Market researcher paper tape is Ok   Xarelto [Rivaroxaban] Other (See Comments)    Gums bleeding and too much bruising   Nickel Rash and Other (See Comments)    Consultations: Cardiology   Procedures/Studies: NM Myocar Multi W/Spect W/Wall Motion / EF  Result Date: 12/30/2020   Abnormal, potentially high risk pharmacologic myocardial perfusion stress test.   There is a large in size, moderate in severity, fixed defect involving the septum most likely due to LBBB but cannot exclude scar.   Left ventricular systolic function is severely reduced (LVEF < 30%) with global hypokinesis.  Recommend echocardiographic correlation.   There is no significant coronary artery  calcification.  Dual chamber pacing leads are noted.   Aortic atherosclerosis is noted as well as small-moderate bilateral pleural effusion (right > left).   DG Chest Port 1 View  Result Date: 12/29/2020 CLINICAL DATA:  Chest pain EXAM: PORTABLE CHEST 1 VIEW COMPARISON:  Chest radiograph dated May 17, 2020 FINDINGS: The heart is mildly enlarged. Pacemaker leads terminating in the right atrium and right ventricle. Hyperinflated lungs. Bibasilar atelectasis. No acute osseous abnormality. IMPRESSION: 1.  Stable cardiomegaly. 2. COPD with bibasilar atelectasis. No focal consolidation or large pleural effusion. Electronically Signed   By: Keane Police D.O.   On: 12/29/2020 13:15   ECHOCARDIOGRAM COMPLETE  Result Date: 12/31/2020    ECHOCARDIOGRAM REPORT   Patient Name:   Alejandra Wiley Date of Exam: 12/31/2020 Medical Rec #:  [id number]  Height:       66.0 in Accession #:    [id number]      Weight:       120.0 lb Date of Birth:  [birth year]      BSA:          1.610 m Patient Age:    11 years        BP:           141/93 mmHg Patient Gender: F               HR:           75 bpm. Exam Location:  [facility] Procedure: 2D Echo, Color Doppler and Cardiac Doppler Indications:     R06.00 Dyspnea  History:         Patient has prior history of Echocardiogram examinations, most                  recent 05/19/2020. CHF, Pacemaker, Arrythmias:Atrial                  Fibrillation; Risk Factors:Hypertension.  Sonographer:     Charmayne Sheer Referring Phys:  [id number] Clance Boll BERGE Diagnosing Phys: Kate Sable MD IMPRESSIONS  1. Left ventricular ejection fraction, by estimation, is 45%. The left ventricle has mildly decreased function. The left ventricle demonstrates global hypokinesis. Left ventricular diastolic parameters are consistent with Grade II diastolic dysfunction (pseudonormalization).  2. Right ventricular systolic function is normal. The right ventricular size is normal.  3. The mitral valve is normal in  structure. Mild mitral valve regurgitation.  4. The aortic valve is tricuspid. Aortic valve regurgitation is not visualized.  5. The inferior vena cava is normal in size with greater than 50% respiratory variability, suggesting right atrial pressure of 3 mmHg. FINDINGS  Left Ventricle: Left ventricular ejection fraction, by estimation, is 45%. The left ventricle has mildly decreased function. The left ventricle demonstrates global hypokinesis. The left ventricular internal cavity size was normal in size. There is no left ventricular hypertrophy. Left ventricular diastolic parameters are consistent with Grade II diastolic dysfunction (pseudonormalization). Right Ventricle: The right ventricular size is normal. No increase in right ventricular wall thickness. Right ventricular systolic function is normal. Left Atrium: Left atrial size was normal in size. Right Atrium: Right atrial size was normal in size. Pericardium: There is no evidence of pericardial effusion. Mitral Valve: The mitral valve is normal in structure. Mild mitral valve regurgitation. Tricuspid Valve: The tricuspid valve is normal in structure. Tricuspid valve regurgitation is mild. Aortic Valve: The aortic valve is tricuspid. Aortic valve regurgitation is not visualized. Aortic valve mean gradient measures 2.0 mmHg. Aortic valve peak gradient measures 3.5 mmHg. Aortic valve area, by VTI measures 2.45 cm. Pulmonic Valve: The pulmonic valve was normal in structure. Pulmonic valve regurgitation is not visualized. Aorta: The aortic root is normal in size and structure. Venous: The inferior vena cava is normal in size with greater than 50% respiratory variability, suggesting right atrial pressure of 3 mmHg. IAS/Shunts: No atrial level shunt detected by color flow Doppler. Additional Comments: A device lead is visualized.  LEFT VENTRICLE PLAX 2D LVIDd:         3.70 cm   Diastology LVIDs:         2.67 cm   LV e' medial:    6.42 cm/s LV PW:         1.13 cm    LV E/e' medial:  16.3 LV IVS:  0.94 cm   LV e' lateral:   4.03 cm/s LVOT diam:     2.10 cm   LV E/e' lateral: 25.9 LV SV:         42 LV SV Index:   26 LVOT Area:     3.46 cm  RIGHT VENTRICLE RV Basal diam:  3.20 cm RV S prime:     7.62 cm/s LEFT ATRIUM           Index        RIGHT ATRIUM           Index LA diam:      4.30 cm 2.67 cm/m   RA Area:     19.20 cm LA Vol (A2C): 35.0 ml 21.75 ml/m  RA Volume:   49.20 ml  30.57 ml/m LA Vol (A4C): 51.9 ml 32.25 ml/m  AORTIC VALVE                    PULMONIC VALVE AV Area (Vmax):    2.77 cm     PV Vmax:          0.60 m/s AV Area (Vmean):   2.45 cm     PV Vmean:         41.400 cm/s AV Area (VTI):     2.45 cm     PV VTI:           0.110 m AV Vmax:           94.00 cm/s   PV Peak grad:     1.4 mmHg AV Vmean:          68.600 cm/s  PV Mean grad:     1.0 mmHg AV VTI:            0.171 m      PR End Diast Vel: 2.92 msec AV Peak Grad:      3.5 mmHg AV Mean Grad:      2.0 mmHg LVOT Vmax:         75.10 cm/s LVOT Vmean:        48.500 cm/s LVOT VTI:          0.121 m LVOT/AV VTI ratio: 0.71  AORTA Ao Root diam: 3.30 cm MITRAL VALVE                TRICUSPID VALVE MV Area (PHT): 4.83 cm     TR Peak grad:   31.8 mmHg MV Decel Time: 157 msec     TR Vmax:        282.00 cm/s MV E velocity: 104.55 cm/s MV A velocity: 31.70 cm/s   SHUNTS MV E/A ratio:  3.30         Systemic VTI:  0.12 m                             Systemic Diam: 2.10 cm Kate Sable MD Electronically signed by Kate Sable MD Signature Date/Time: 12/31/2020/3:47:37 PM    Final    CUP PACEART INCLINIC DEVICE CHECK  Result Date: 12/19/2020 Pacemaker check in clinic. Normal device function. Thresholds, sensing, impedances consistent with previous measurements. Device programmed to maximize longevity. 165 AHR episodes (Burden 5.9%, +OAC), longest episode 31 hours, with peak A/V rates of 400/163.  15 VHR episodes, longest episode appears to be AF with RVR lasting 7 seconds with peak A/V rates of 317/154.   Histogram distribution appropriate for patient activity level. Device programmed  to optimize intrinsic conduction. Estimated longevity  3.5 years. Patient enrolled in remote follow-up, next scheduled check 01/15/21. Patient education completed.Trena Platt, BSN, RN  (Echo, Carotid, EGD, Colonoscopy, ERCP)    Subjective: Patient seen and examined today multiple times with her wishes to go home by evening.  By evening, she is moving around in the room, walking in the hallway without shortness of breath and desperately wanting to go home.  She denied any complaints. Discussed with cardiology, converted to oral medications and discharged with outpatient follow-up plan.   Discharge Exam: Vitals:   12/31/20 1155 12/31/20 1615  BP: (!) 141/93 111/72  Pulse: 75 70  Resp: 15 16  Temp: 97.8 F (36.6 C) (!) 97.5 F (36.4 C)  SpO2: 96% 97%   Vitals:   12/31/20 0335 12/31/20 0911 12/31/20 1155 12/31/20 1615  BP: (!) 135/91 (!) 147/85 (!) 141/93 111/72  Pulse:  (!) 105 75 70  Resp: 17 16 15 16   Temp: 97.6 F (36.4 C) (!) 97.5 F (36.4 C) 97.8 F (36.6 C) (!) 97.5 F (36.4 C)  TempSrc: Oral     SpO2: (!) 3% 94% 96% 97%  Weight:      Height:        General: Pt is alert, awake, not in acute distress Younger than his stated age.  Walking in room air. Cardiovascular: Irregularly irregular, pacemaker present S1/S2 +, no rubs, no gallops Respiratory: CTA bilaterally, no wheezing, no rhonchi Abdominal: Soft, NT, ND, bowel sounds + Extremities: no edema, no cyanosis    The results of significant diagnostics from this hospitalization (including imaging, microbiology, ancillary and laboratory) are listed below for reference.     Microbiology: Recent Results (from the past 240 hour(s))  Resp Panel by RT-PCR (Flu A&B, Covid) Nasopharyngeal Swab     Status: None   Collection Time: 12/29/20 11:09 PM   Specimen: Nasopharyngeal Swab; Nasopharyngeal(NP) swabs in vial transport medium  Result Value  Ref Range Status   SARS Coronavirus 2 by RT PCR NEGATIVE NEGATIVE Final    Comment: (NOTE) SARS-CoV-2 target nucleic acids are NOT DETECTED.  The SARS-CoV-2 RNA is generally detectable in upper respiratory specimens during the acute phase of infection. The lowest concentration of SARS-CoV-2 viral copies this assay can detect is 138 copies/mL. A negative result does not preclude SARS-Cov-2 infection and should not be used as the sole basis for treatment or other patient management decisions. A negative result may occur with  improper specimen collection/handling, submission of specimen other than nasopharyngeal swab, presence of viral mutation(s) within the areas targeted by this assay, and inadequate number of viral copies(<138 copies/mL). A negative result must be combined with clinical observations, patient history, and epidemiological information. The expected result is Negative.  Fact Sheet for Patients:  EntrepreneurPulse.com.au  Fact Sheet for Healthcare Providers:  IncredibleEmployment.be  This test is no t yet approved or cleared by the [place] FDA and  has been authorized for detection and/or diagnosis of SARS-CoV-2 by FDA under an Emergency Use Authorization (EUA). This EUA will remain  in effect (meaning this test can be used) for the duration of the COVID-19 declaration under Section 564(b)(1) of the Act, 21 U.S.C.section 360bbb-3(b)(1), unless the authorization is terminated  or revoked sooner.       Influenza A by PCR NEGATIVE NEGATIVE Final   Influenza B by PCR NEGATIVE NEGATIVE Final    Comment: (NOTE) The Xpert Xpress SARS-CoV-2/FLU/RSV plus assay is intended as an aid in the diagnosis of influenza from  Nasopharyngeal swab specimens and should not be used as a sole basis for treatment. Nasal washings and aspirates are unacceptable for Xpert Xpress SARS-CoV-2/FLU/RSV testing.  Fact Sheet for  Patients: EntrepreneurPulse.com.au  Fact Sheet for Healthcare Providers: IncredibleEmployment.be  This test is not yet approved or cleared by the [place] FDA and has been authorized for detection and/or diagnosis of SARS-CoV-2 by FDA under an Emergency Use Authorization (EUA). This EUA will remain in effect (meaning this test can be used) for the duration of the COVID-19 declaration under Section 564(b)(1) of the Act, 21 U.S.C. section 360bbb-3(b)(1), unless the authorization is terminated or revoked.  Performed at [facility], [address] [place], [place] [address]      Labs: BNP (last 3 results) Recent Labs    05/17/20 1146 12/29/20 1237  BNP 371.0* 0,300.9*   Basic Metabolic Panel: Recent Labs  Lab 12/29/20 1237 12/30/20 0632  NA 135 132*  K 4.7 4.2  CL 106 104  CO2 21* 21*  GLUCOSE 131* 145*  BUN 30* 28*  CREATININE 0.87 0.88  CALCIUM 9.4 8.9  MG 2.4  --    Liver Function Tests: No results for input(s): AST, ALT, ALKPHOS, BILITOT, PROT, ALBUMIN in the last 168 hours. No results for input(s): LIPASE, AMYLASE in the last 168 hours. No results for input(s): AMMONIA in the last 168 hours. CBC: Recent Labs  Lab 12/29/20 1237 12/30/20 0632  WBC 7.9 8.0  HGB 12.2 12.5  HCT 37.9 37.9  MCV 97.4 95.0  PLT 236 224   Cardiac Enzymes: No results for input(s): CKTOTAL, CKMB, CKMBINDEX, TROPONINI in the last 168 hours. BNP: Invalid input(s): POCBNP CBG: No results for input(s): GLUCAP in the last 168 hours. D-Dimer No results for input(s): DDIMER in the last 72 hours. Hgb A1c No results for input(s): HGBA1C in the last 72 hours. Lipid Profile No results for input(s): CHOL, HDL, LDLCALC, TRIG, CHOLHDL, LDLDIRECT in the last 72 hours. Thyroid function studies No results for input(s): TSH, T4TOTAL, T3FREE, THYROIDAB in the last 72 hours.  Invalid input(s): FREET3 Anemia work up No results for input(s):  VITAMINB12, FOLATE, FERRITIN, TIBC, IRON, RETICCTPCT in the last 72 hours. Urinalysis    Component Value Date/Time   COLORURINE STRAW (A) 05/17/2020 1247   APPEARANCEUR CLEAR (A) 05/17/2020 1247   APPEARANCEUR Clear 09/16/2012 2023   LABSPEC 1.006 05/17/2020 1247   LABSPEC 1.016 09/16/2012 2023   PHURINE 5.0 05/17/2020 1247   GLUCOSEU NEGATIVE 05/17/2020 1247   GLUCOSEU Negative 09/16/2012 2023   HGBUR MODERATE (A) 05/17/2020 1247   BILIRUBINUR NEGATIVE 05/17/2020 1247   BILIRUBINUR Negative 09/16/2012 2023   KETONESUR NEGATIVE 05/17/2020 1247   PROTEINUR NEGATIVE 05/17/2020 1247   NITRITE NEGATIVE 05/17/2020 1247   LEUKOCYTESUR NEGATIVE 05/17/2020 1247   LEUKOCYTESUR 1+ 09/16/2012 2023   Sepsis Labs Invalid input(s): PROCALCITONIN,  WBC,  LACTICIDVEN Microbiology Recent Results (from the past 240 hour(s))  Resp Panel by RT-PCR (Flu A&B, Covid) Nasopharyngeal Swab     Status: None   Collection Time: 12/29/20 11:09 PM   Specimen: Nasopharyngeal Swab; Nasopharyngeal(NP) swabs in vial transport medium  Result Value Ref Range Status   SARS Coronavirus 2 by RT PCR NEGATIVE NEGATIVE Final    Comment: (NOTE) SARS-CoV-2 target nucleic acids are NOT DETECTED.  The SARS-CoV-2 RNA is generally detectable in upper respiratory specimens during the acute phase of infection. The lowest concentration of SARS-CoV-2 viral copies this assay can detect is 138 copies/mL. A negative result does not preclude  SARS-Cov-2 infection and should not be used as the sole basis for treatment or other patient management decisions. A negative result may occur with  improper specimen collection/handling, submission of specimen other than nasopharyngeal swab, presence of viral mutation(s) within the areas targeted by this assay, and inadequate number of viral copies(<138 copies/mL). A negative result must be combined with clinical observations, patient history, and epidemiological information. The expected  result is Negative.  Fact Sheet for Patients:  EntrepreneurPulse.com.au  Fact Sheet for Healthcare Providers:  IncredibleEmployment.be  This test is no t yet approved or cleared by the [place] FDA and  has been authorized for detection and/or diagnosis of SARS-CoV-2 by FDA under an Emergency Use Authorization (EUA). This EUA will remain  in effect (meaning this test can be used) for the duration of the COVID-19 declaration under Section 564(b)(1) of the Act, 21 U.S.C.section 360bbb-3(b)(1), unless the authorization is terminated  or revoked sooner.       Influenza A by PCR NEGATIVE NEGATIVE Final   Influenza B by PCR NEGATIVE NEGATIVE Final    Comment: (NOTE) The Xpert Xpress SARS-CoV-2/FLU/RSV plus assay is intended as an aid in the diagnosis of influenza from Nasopharyngeal swab specimens and should not be used as a sole basis for treatment. Nasal washings and aspirates are unacceptable for Xpert Xpress SARS-CoV-2/FLU/RSV testing.  Fact Sheet for Patients: EntrepreneurPulse.com.au  Fact Sheet for Healthcare Providers: IncredibleEmployment.be  This test is not yet approved or cleared by the [place] FDA and has been authorized for detection and/or diagnosis of SARS-CoV-2 by FDA under an Emergency Use Authorization (EUA). This EUA will remain in effect (meaning this test can be used) for the duration of the COVID-19 declaration under Section 564(b)(1) of the Act, 21 U.S.C. section 360bbb-3(b)(1), unless the authorization is terminated or revoked.  Performed at [facility], [address] [place], [place] [address]      Time coordinating discharge: 40 minutes  SIGNED:   Barb Merino, MD  [facility] 12/31/2020, 4:45 PM

## 2021-01-05 DIAGNOSIS — I480 Paroxysmal atrial fibrillation: Secondary | ICD-10-CM | POA: Diagnosis not present

## 2021-01-15 ENCOUNTER — Ambulatory Visit (INDEPENDENT_AMBULATORY_CARE_PROVIDER_SITE_OTHER): Payer: Medicare HMO

## 2021-01-15 DIAGNOSIS — I495 Sick sinus syndrome: Secondary | ICD-10-CM

## 2021-01-16 LAB — CUP PACEART REMOTE DEVICE CHECK
Battery Impedance: 1864 Ohm
Battery Remaining Longevity: 37 mo
Battery Voltage: 2.75 V
Brady Statistic AP VP Percent: 5 %
Brady Statistic AP VS Percent: 55 %
Brady Statistic AS VP Percent: 1 %
Brady Statistic AS VS Percent: 38 %
Date Time Interrogation Session: 20221229190818
Implantable Lead Implant Date: 20051130
Implantable Lead Implant Date: 20051130
Implantable Lead Location: 753859
Implantable Lead Location: 753860
Implantable Lead Model: 5076
Implantable Pulse Generator Implant Date: 20140901
Lead Channel Impedance Value: 563 Ohm
Lead Channel Impedance Value: 601 Ohm
Lead Channel Pacing Threshold Amplitude: 0.5 V
Lead Channel Pacing Threshold Amplitude: 1.125 V
Lead Channel Pacing Threshold Pulse Width: 0.4 ms
Lead Channel Pacing Threshold Pulse Width: 0.4 ms
Lead Channel Setting Pacing Amplitude: 2 V
Lead Channel Setting Pacing Amplitude: 2.5 V
Lead Channel Setting Pacing Pulse Width: 0.46 ms
Lead Channel Setting Sensing Sensitivity: 5.6 mV

## 2021-01-27 ENCOUNTER — Other Ambulatory Visit: Payer: Self-pay

## 2021-01-27 MED ORDER — AMIODARONE HCL 200 MG PO TABS: 200.0000 mg | ORAL_TABLET | Freq: Two times a day (BID) | ORAL | 0 refills | Status: DC

## 2021-01-27 MED ORDER — METOPROLOL SUCCINATE ER 50 MG PO TB24: 50.0000 mg | ORAL_TABLET | Freq: Every day | ORAL | 0 refills | Status: DC

## 2021-01-27 NOTE — Telephone Encounter (Signed)
*  STAT* If patient is at the pharmacy, call can be transferred to refill team.   1. Which medications need to be refilled? (please list name of each medication and dose if known) Metoprolol, Amiodarone  2. Which pharmacy/location (including street and city if local pharmacy) is medication to be sent to?[facility] [address]  3. Do they need a 30 day or 90 day supply? [place]

## 2021-01-28 NOTE — Progress Notes (Signed)
Remote pacemaker transmission.   

## 2021-02-04 ENCOUNTER — Telehealth: Payer: Self-pay | Admitting: Internal Medicine

## 2021-02-04 NOTE — Telephone Encounter (Signed)
Patient calling Would like a clarification on how she is to take amiodarone 200 MG since leaving the hospital Please call to discuss

## 2021-02-05 NOTE — Telephone Encounter (Signed)
Late entry:  I spoke with the patient yesterday afternoon regarding his amiodarone dosing post discharge. She was discharged from [facility] 12/31/20 s/p a-fib w/ RVR. Orders were for her to take amiodarone 200 mg BID on her discharge paperwork.  Per the patient, she picked up a refill yesterday that had been sent in for amiodarone 200 mg BID. The patient confirmed she did take amiodarone 200 mg BID x 1 week post discharge, then she decreased amiodarone to 200 mg QD.  I advised the patient if she had already decreased her amiodarone to 200 mg QD and was not having any recurrent issues with a-fib w/ RVR, then she should continue with the once daily dosing. She is also advised to keep her appointment with Dr. Caryl Comes on 03/03/21.  The patient voices understanding of the above and is agreeable.

## 2021-02-21 ENCOUNTER — Other Ambulatory Visit: Payer: Self-pay | Admitting: Internal Medicine

## 2021-02-23 NOTE — Telephone Encounter (Signed)
Please advise if ok to refill Amiodarone 200 mg qd historical provider. Refill request received for Amiodarone 200 mg bid. Pt taking Amiodarone 200 mg qd.

## 2021-03-03 ENCOUNTER — Encounter: Payer: Medicare HMO | Admitting: Internal Medicine

## 2021-03-05 DIAGNOSIS — J320 Chronic maxillary sinusitis: Secondary | ICD-10-CM | POA: Diagnosis not present

## 2021-03-05 DIAGNOSIS — R0982 Postnasal drip: Secondary | ICD-10-CM | POA: Diagnosis not present

## 2021-03-17 ENCOUNTER — Encounter: Payer: Self-pay | Admitting: Internal Medicine

## 2021-03-17 ENCOUNTER — Other Ambulatory Visit: Payer: Self-pay

## 2021-03-17 ENCOUNTER — Ambulatory Visit (INDEPENDENT_AMBULATORY_CARE_PROVIDER_SITE_OTHER): Payer: Medicare HMO | Admitting: Internal Medicine

## 2021-03-17 VITALS — BP 158/88 | HR 81 | Ht 66.0 in | Wt 124.0 lb

## 2021-03-17 DIAGNOSIS — I10 Essential (primary) hypertension: Secondary | ICD-10-CM

## 2021-03-17 DIAGNOSIS — I951 Orthostatic hypotension: Secondary | ICD-10-CM | POA: Diagnosis not present

## 2021-03-17 DIAGNOSIS — I495 Sick sinus syndrome: Secondary | ICD-10-CM | POA: Diagnosis not present

## 2021-03-17 DIAGNOSIS — Z79899 Other long term (current) drug therapy: Secondary | ICD-10-CM | POA: Diagnosis not present

## 2021-03-17 DIAGNOSIS — Z950 Presence of cardiac pacemaker: Secondary | ICD-10-CM | POA: Diagnosis not present

## 2021-03-17 DIAGNOSIS — I480 Paroxysmal atrial fibrillation: Secondary | ICD-10-CM

## 2021-03-17 DIAGNOSIS — I491 Atrial premature depolarization: Secondary | ICD-10-CM | POA: Diagnosis not present

## 2021-03-17 LAB — PACEMAKER DEVICE OBSERVATION

## 2021-03-17 NOTE — Patient Instructions (Addendum)
Medication Instructions:  - Your physician recommends that you continue on your current medications as directed. Please refer to the Current Medication list given to you today.  *If you need a refill on your cardiac medications before your next appointment, please call your pharmacy*   Lab Work: - Your physician recommends that you have lab work today: TSH/ Liver  If you have labs (blood work) drawn today and your tests are completely normal, you will receive your results only by: [place] (if you have [place]) OR A paper copy in the mail If you have any lab test that is abnormal or we need to change your treatment, we will call you to review the results.   Testing/Procedures: - none ordered    Follow-Up: At [facility], you and your health needs are our priority.  As part of our continuing mission to provide you with exceptional heart care, we have created designated Provider Care Teams.  These Care Teams include your primary Cardiologist (physician) and Advanced Practice Providers (APPs -  Physician Assistants and Nurse Practitioners) who all work together to provide you with the care you need, when you need it.  We recommend signing up for the patient portal called "MyChart".  Sign up information is provided on this After Visit Summary.  MyChart is used to connect with patients for Virtual Visits (Telemedicine).  Patients are able to view lab/test results, encounter notes, upcoming appointments, etc.  Non-urgent messages can be sent to your provider as well.   To learn more about what you can do with MyChart, go to [email or web address].    Your next appointment:   3 month(s)  The format for your next appointment:   In Person  Provider:   [name], MD    Other Instructions N/a

## 2021-03-17 NOTE — Progress Notes (Signed)
Patient Care Team: Kirk Ruths, MD as PCP - General (Unknown Physician Specialty) Deboraha Sprang, MD as PCP - Cardiology (Cardiology) Deboraha Sprang, MD as PCP - Electrophysiology (Cardiology)   HPI  Alejandra Wiley is a 88 y.o. female seen in followup for palpitations associated with atrial arrhythmias as well as bradycardia requiring backup bradycardia pacing. She previously took flecainide.  Resulted in prolonged AV nodal conduction and ventricular pacing.  Deterioration of LV function; flecainide discontinued with subsequent normalization of LV function.    1/20 Hospitalized with stroke- R sided weakness.  INR subtherapeutic 1.52.  >>imaging  CT  No acute stroke.  Carotids neg.  Coumadin d/c and apixoban started ( 2.5 mg bid -wt 55 kg;age->82)  4/22 hospitalized with weakness and found to have Afib RVR>>> prompting increase of her nadolol.  Further uptitration to 60 bid, still with some rapid rates ( (See Below)    12/22 admitted to the hospital again with atrial fibrillation and rapid rate and acute congestive heart failure She was started on amiodarone and her nadolol was discontinued.  Short acting metoprolol was added.  She is tolerating these medications well without intolerances.  She did have an episode the other day where she became weak while walking.  Her heart rate was noted to be 90.  Nothing was detected on her device.  She is glad to be myriad again.  Happier than she has been she says in many years.        Date VP%  3/14 1  3/*16 95  10/17 95  10/18 92-DDD-DDI  6/19 68  10/19 84  4/20  3.3  7/21 9      DATE TEST EF Vp %    /13 Echo  55-65%    8/19 Echo  50-55% 84%  1/20 Echo   30-35%   11/21 Echo  55-60   5/22 Echo  55-55%   12/22 Echo  45% GLOBAL   12/22 Myoview <30%       Date Cr K Hgb TSH LFT  8/18 0.67  12.2    9/18 0.64  10    1/20 0.95  11.8    6/20 0.9  11.7    5/22 0.77  11.3    10/22 0.8 4.4  4.84   12/22 0.8 3.7  12.5 4.062 25          Thromboembolic risk factors ( age  -2, HTN-1, TIA/CVA-2, DM-1, CHF-1, Gender-1) for a CHADSVASc Score of >=8    Past Medical History:  Diagnosis Date   Bradycardia    Cancer (HCC)    vaginal intraepithelial neoplasia   CHF (congestive heart failure) ([place])    Hypertension    Pacemaker 12/06/2003   Medtronic Impulse DTDR01   Pacemaker 2015   PAF (paroxysmal atrial fibrillation) ([place])    a. on Coumadin and flecainide; b. CHADS2VASc 3 (HTN, age x 2, female)   Sick sinus syndrome ([place])    Sinoatrial node dysfunction ([place])    Stroke ([place])    Vaginal intraepithelial neoplasia     Past Surgical History:  Procedure Laterality Date   APPENDECTOMY     HERNIA REPAIR     lower abd   IMAGE GUIDED SINUS SURGERY  2015   INSERT / REPLACE / REMOVE PACEMAKER  12/06/2003   Medtronic impulse DTDR01   JOINT REPLACEMENT Right    knee   KYPHOPLASTY N/A 06/23/2017   Procedure: Deatra Canter;  Surgeon: Hessie Knows,  MD;  Location: [facility] ORS;  Service: Orthopedics;  Laterality: N/A;   LEEP  1999   ORIF HIP FRACTURE Right 02/2016   PACEMAKER GENERATOR CHANGE N/A 09/18/2012   Procedure: PACEMAKER GENERATOR CHANGE;  Surgeon: Deboraha Sprang, MD;  Location: [facility] CATH LAB;  Service: Cardiovascular;  Laterality: N/A;   TONSILLECTOMY     TOTAL KNEE ARTHROPLASTY     TOTAL KNEE REVISION Right 09/23/2016   Procedure: TOTAL KNEE REVISION-POLYETHYLENE EXCHANGE;  Surgeon: Hessie Knows, MD;  Location: [facility] ORS;  Service: Orthopedics;  Laterality: Right;   VAGINAL HYSTERECTOMY      Current Outpatient Medications  Medication Sig Dispense Refill   acetaminophen (TYLENOL) 500 MG tablet Take 500 mg by mouth every 8 (eight) hours as needed for mild pain or headache.     amiodarone (PACERONE) 200 MG tablet Take 1 tablet (200 mg total) by mouth daily. 90 tablet 1   ELIQUIS 2.5 MG TABS tablet TAKE 1 TABLET TWICE DAILY 180 tablet 1   fluticasone (FLONASE) 50 MCG/ACT nasal spray Place 2 sprays  into both nostrils daily as needed for allergies or rhinitis.      furosemide (LASIX) 40 MG tablet Take 1 tablet (40 mg total) by mouth daily. 30 tablet 2   ibandronate (BONIVA) 150 MG tablet Take 150 mg by mouth every 30 (thirty) days. Take in the morning with a full glass of water, on an empty stomach, and do not take anything else by mouth or lie down for the next 30 min.     metoprolol succinate (TOPROL-XL) 50 MG 24 hr tablet TAKE 1 TABLET BY MOUTH DAILY. TAKE WITH OR IMMEDIATELY FOLLOWING A MEAL. 30 tablet 1   Multiple Vitamins-Minerals (SENIOR MULTIVITAMIN PLUS PO) Take 1 tablet by mouth daily.     Multiple Vitamins-Minerals (ZINC PO) Take 1 tablet by mouth daily.     potassium chloride SA (KLOR-CON) 20 MEQ tablet TAKE 1/2 TABLET TWICE DAILY (EVERY MORNING AND EVERY EVENING) (Patient taking differently: Take 10 mEq by mouth daily as needed (leg cramps).) 90 tablet 3   No current facility-administered medications for this visit.    Allergies  Allergen Reactions   Latex Other (See Comments)    blisters   Other Other (See Comments)    Blisters/ paper tape is Market researcher paper tape is Market researcher paper tape is Clinical research associate paper tape is Ok   Adult nurse (See Comments)    Blisters/ paper tape is Market researcher paper tape is Ok   Xarelto [Rivaroxaban] Other (See Comments)    Gums bleeding and too much bruising   Nickel Rash and Other (See Comments)    Review of Systems negative except from HPI and PMH BP (!) 158/88 (BP Location: Left Arm, Patient Position: Sitting, Cuff Size: Normal)    Ht '5\' 6"'  (1.676 m)    Wt 124 lb (56.2 kg)    SpO2 96%    BMI 20.01 kg/m  Well developed and nourished in no acute distress HENT normal Neck supple with JVP-  flat   Device pocket well healed; without hematoma or erythema.  There is no tethering  Clear Regular rate and rhythm, no murmurs or gallops Abd-soft with active BS No Clubbing cyanosis edema Skin-warm and dry A & Oriented  Grossly normal  sensory and motor function    ECG atrial pacing at 81 Intervals 35/15/44 ECG 7/22 PR interval is about 2 70 ms  Assessment and  Plan  Atrial arrhythmia/Fib (5.9%) with some RVR  Sinus node dysfunction  Hypertension   Syncope  High Risk Medication Surveillance amiodarone  Pacemaker  Medtronic  Dual chamber   Elevated pacing thresholds A Greater    HFpEF>> LV dysfunction resolved and now recurring   Orthostatic hypotension  1 AVB-profound  Patient's orthostasis is quiescient.  We will continue her metoprolol.  Her cardiomyopathy was related temporally to her atrial fibrillation rapid rate; hopefully at next assessment we will have seen recovery.  We will check an echo in 3 months.  She was started on amiodarone.  Interval episodes of atrial fibrillation have been present they look more like flutter and the ventricular rates instead of averaging in the 130+ range are now averaging in the 80-100 range.  She is tolerating the much better.  We will continue the amiodarone and check her surveillance laboratories today.  We will see her again in 3 months  No interval syncope.  Exercise tolerance may turn out to be limited by profound first-degree AV block I suspect that this is being aggravated by her amiodarone.  She would be a candidate for consideration of CRT upgrade particularly if her LV function does not improve or if her functional tolerance worsens

## 2021-03-18 LAB — CUP PACEART INCLINIC DEVICE CHECK
Date Time Interrogation Session: 20230301104922
Implantable Lead Implant Date: 20051130
Implantable Lead Implant Date: 20051130
Implantable Lead Location: 753859
Implantable Lead Location: 753860
Implantable Lead Model: 5076
Implantable Pulse Generator Implant Date: 20140901

## 2021-03-18 LAB — HEPATIC FUNCTION PANEL
ALT: 18 IU/L (ref 0–32)
AST: 23 IU/L (ref 0–40)
Albumin: 4.1 g/dL (ref 3.6–4.6)
Alkaline Phosphatase: 65 IU/L (ref 44–121)
Bilirubin Total: 0.5 mg/dL (ref 0.0–1.2)
Bilirubin, Direct: 0.12 mg/dL (ref 0.00–0.40)
Total Protein: 6.7 g/dL (ref 6.0–8.5)

## 2021-03-18 LAB — TSH: TSH: 2.04 u[IU]/mL (ref 0.450–4.500)

## 2021-03-23 ENCOUNTER — Encounter: Payer: Self-pay | Admitting: Internal Medicine

## 2021-03-23 ENCOUNTER — Other Ambulatory Visit: Payer: Self-pay

## 2021-03-23 MED ORDER — APIXABAN 2.5 MG PO TABS: 2.5000 mg | ORAL_TABLET | Freq: Two times a day (BID) | ORAL | 0 refills | Status: DC

## 2021-03-23 NOTE — Telephone Encounter (Signed)
Prescription refill request for Eliquis received. ?Indication:Afib ?Last office visit:2/23 ?Scr:0.8 ?Age: 88 ?Weight:56.2 kg ? ?Prescription refilled ? ?

## 2021-03-27 ENCOUNTER — Emergency Department: Payer: Medicare Other

## 2021-03-27 ENCOUNTER — Emergency Department
Admission: EM | Admit: 2021-03-27 | Discharge: 2021-03-28 | Disposition: A | Discharge status: Home / Self Care | Payer: Medicare Other | Attending: Emergency Medicine | Admitting: Emergency Medicine

## 2021-03-27 ENCOUNTER — Other Ambulatory Visit: Payer: Self-pay

## 2021-03-27 DIAGNOSIS — S42291A Other displaced fracture of upper end of right humerus, initial encounter for closed fracture: Secondary | ICD-10-CM

## 2021-03-27 DIAGNOSIS — S0990XA Unspecified injury of head, initial encounter: Secondary | ICD-10-CM | POA: Diagnosis not present

## 2021-03-27 DIAGNOSIS — Y92512 Supermarket, store or market as the place of occurrence of the external cause: Secondary | ICD-10-CM | POA: Insufficient documentation

## 2021-03-27 DIAGNOSIS — I509 Heart failure, unspecified: Secondary | ICD-10-CM | POA: Diagnosis not present

## 2021-03-27 DIAGNOSIS — I110 Hypertensive heart disease with heart failure: Secondary | ICD-10-CM | POA: Diagnosis not present

## 2021-03-27 DIAGNOSIS — S01111A Laceration without foreign body of right eyelid and periocular area, initial encounter: Secondary | ICD-10-CM | POA: Insufficient documentation

## 2021-03-27 DIAGNOSIS — S0083XA Contusion of other part of head, initial encounter: Secondary | ICD-10-CM

## 2021-03-27 DIAGNOSIS — Z20822 Contact with and (suspected) exposure to covid-19: Secondary | ICD-10-CM | POA: Diagnosis not present

## 2021-03-27 DIAGNOSIS — S42201A Unspecified fracture of upper end of right humerus, initial encounter for closed fracture: Secondary | ICD-10-CM | POA: Diagnosis not present

## 2021-03-27 DIAGNOSIS — S4991XA Unspecified injury of right shoulder and upper arm, initial encounter: Secondary | ICD-10-CM | POA: Diagnosis present

## 2021-03-27 DIAGNOSIS — W010XXA Fall on same level from slipping, tripping and stumbling without subsequent striking against object, initial encounter: Secondary | ICD-10-CM | POA: Insufficient documentation

## 2021-03-27 LAB — CBC WITH DIFFERENTIAL/PLATELET
Abs Immature Granulocytes: 0.01 10*3/uL (ref 0.00–0.07)
Basophils Absolute: 0 10*3/uL (ref 0.0–0.1)
Basophils Relative: 1 %
Eosinophils Absolute: 0.3 10*3/uL (ref 0.0–0.5)
Eosinophils Relative: 5 %
HCT: 32.1 % — ABNORMAL LOW (ref 36.0–46.0)
Hemoglobin: 10 g/dL — ABNORMAL LOW (ref 12.0–15.0)
Immature Granulocytes: 0 %
Lymphocytes Relative: 32 %
Lymphs Abs: 1.9 10*3/uL (ref 0.7–4.0)
MCH: 30.8 pg (ref 26.0–34.0)
MCHC: 31.2 g/dL (ref 30.0–36.0)
MCV: 98.8 fL (ref 80.0–100.0)
Monocytes Absolute: 0.6 10*3/uL (ref 0.1–1.0)
Monocytes Relative: 10 %
Neutro Abs: 3.1 10*3/uL (ref 1.7–7.7)
Neutrophils Relative %: 52 %
Platelets: 161 10*3/uL (ref 150–400)
RBC: 3.25 MIL/uL — ABNORMAL LOW (ref 3.87–5.11)
RDW: 14.6 % (ref 11.5–15.5)
WBC: 5.9 10*3/uL (ref 4.0–10.5)
nRBC: 0 % (ref 0.0–0.2)

## 2021-03-27 LAB — URINALYSIS, ROUTINE W REFLEX MICROSCOPIC
Bacteria, UA: NONE SEEN
Bilirubin Urine: NEGATIVE
Glucose, UA: NEGATIVE mg/dL
Ketones, ur: NEGATIVE mg/dL
Leukocytes,Ua: NEGATIVE
Nitrite: NEGATIVE
Protein, ur: NEGATIVE mg/dL
Specific Gravity, Urine: 1.012 (ref 1.005–1.030)
pH: 7 (ref 5.0–8.0)

## 2021-03-27 LAB — BASIC METABOLIC PANEL
Anion gap: 9 (ref 5–15)
BUN: 20 mg/dL (ref 8–23)
CO2: 24 mmol/L (ref 22–32)
Calcium: 8.2 mg/dL — ABNORMAL LOW (ref 8.9–10.3)
Chloride: 106 mmol/L (ref 98–111)
Creatinine, Ser: 0.81 mg/dL (ref 0.44–1.00)
GFR, Estimated: >60 mL/min (ref >60)
Glucose, Bld: 129 mg/dL — ABNORMAL HIGH (ref 70–99)
Potassium: 3.8 mmol/L (ref 3.5–5.1)
Sodium: 139 mmol/L (ref 135–145)

## 2021-03-27 LAB — RESP PANEL BY RT-PCR (FLU A&B, COVID) ARPGX2
Influenza A by PCR: NEGATIVE
Influenza B by PCR: NEGATIVE
SARS Coronavirus 2 by RT PCR: NEGATIVE

## 2021-03-27 MED ORDER — LIDOCAINE-EPINEPHRINE-TETRACAINE (LET) SOLUTION
3.0000 mL | Freq: Once | NASAL | Status: AC
  Administered 2021-03-27: 3 mL via TOPICAL
  Filled 2021-03-27: qty 3

## 2021-03-27 MED ORDER — ONDANSETRON HCL 4 MG/2ML IJ SOLN
4.0000 mg | Freq: Once | INTRAMUSCULAR | Status: AC
  Administered 2021-03-27: 4 mg via INTRAVENOUS
  Filled 2021-03-27: qty 2

## 2021-03-27 MED ORDER — MORPHINE SULFATE (PF) 4 MG/ML IV SOLN
4.0000 mg | Freq: Once | INTRAVENOUS | Status: AC
  Administered 2021-03-27: 4 mg via INTRAVENOUS
  Filled 2021-03-27: qty 1

## 2021-03-27 MED ORDER — HYDROMORPHONE HCL 1 MG/ML IJ SOLN
1.0000 mg | Freq: Once | INTRAMUSCULAR | Status: AC
  Administered 2021-03-27: 1 mg via INTRAVENOUS
  Filled 2021-03-27: qty 1

## 2021-03-27 MED ORDER — METOCLOPRAMIDE HCL 5 MG/ML IJ SOLN
5.0000 mg | Freq: Once | INTRAMUSCULAR | Status: AC
Start: 2021-03-27 — End: 2021-03-27
  Administered 2021-03-27: 5 mg via INTRAVENOUS
  Filled 2021-03-27: qty 2

## 2021-03-27 MED ORDER — HYDROCODONE-ACETAMINOPHEN 5-325 MG PO TABS
2.0000 | ORAL_TABLET | Freq: Once | ORAL | Status: AC
  Administered 2021-03-27: 2 via ORAL
  Filled 2021-03-27: qty 2

## 2021-03-27 NOTE — ED Provider Notes (Signed)
[facility] Emergency Department Provider Note     Event Date/Time   First MD Initiated Contact with Patient 03/27/21 1742     (approximate)   History   Fall   HPI  Alejandra Wiley is a 88 y.o. female with a history of A-fib on Eliquis, heart failure with pacemaker, and hypertension, presents to the ED following mechanical fall.  Patient presents via EMS from a local grocery store she apparently slipped and fell on the sidewalk.  Patient landed primarily on her right side, presents with right shoulder pain and disability as well as a large hematoma and laceration over the right brow.  Patient denies any LOC, nausea, vomiting, or dizziness.  She was given 50 mcg of fentanyl and 4 mg of Zofran in route via EMS.     Physical Exam   Triage Vital Signs: ED Triage Vitals  Enc Vitals Group     BP 03/27/21 1727 (!) 192/94     Pulse Rate 03/27/21 1728 72     Resp 03/27/21 1727 18     Temp 03/27/21 1727 98 F (36.7 C)     Temp Source 03/27/21 1727 Oral     SpO2 03/27/21 1727 100 %     Weight --      Height --      Head Circumference --      Peak Flow --      Pain Score 03/27/21 1725 8     Pain Loc --      Pain Edu? --      Excl. in [place]? --     Most recent vital signs: Vitals:   03/27/21 2000 03/27/21 2100  BP: (!) 183/91 (!) 159/74  Pulse: 70 68  Resp: 18 16  Temp:    SpO2: 100% 94%    General Awake, no distress.  HEENT Norcephalic except for a large hematoma and laceration over the right brow. PERRL. EOMI. No rhinorrhea/epistaxis. Mucous membranes are moist.  CV:  Good peripheral perfusion.  RESP:  Normal effort. CTA ABD:  No distention. Soft, nontender MSK:  Right shoulder without obvious deformity or dislocation.  Patient with anterior puncture wound noted to the right shoulder with some active bleeding appreciated.  Normal composite fist distally.  He is supple with normal range of motion no midline tenderness appreciated.   ED Results  / Procedures / Treatments   Labs (all labs ordered are listed, but only abnormal results are displayed) Labs Reviewed  BASIC METABOLIC PANEL - Abnormal; Notable for the following components:      Result Value   Glucose, Bld 129 (*)    Calcium 8.2 (*)    All other components within normal limits  CBC WITH DIFFERENTIAL/PLATELET - Abnormal; Notable for the following components:   RBC 3.25 (*)    Hemoglobin 10.0 (*)    HCT 32.1 (*)    All other components within normal limits  URINALYSIS, ROUTINE W REFLEX MICROSCOPIC - Abnormal; Notable for the following components:   Color, Urine STRAW (*)    APPearance CLEAR (*)    Hgb urine dipstick MODERATE (*)    All other components within normal limits     EKG   RADIOLOGY  I personally viewed and evaluated these images as part of my medical decision making, as well as reviewing the written report by the radiologist.  ED Provider Interpretation: humeral head fracture noted}  DG Shoulder Right  Result Date: 03/27/2021 CLINICAL DATA:  pain and  disability s/p fall EXAM: RIGHT SHOULDER - 2+ VIEW COMPARISON:  None. FINDINGS: Comminuted right humeral head and neck fracture. No right shoulder dislocation. There is no evidence of arthropathy or other focal bone abnormality. Soft tissues are unremarkable. IMPRESSION: Comminuted right humeral head and neck fracture. Electronically Signed   By: Iven Finn M.D.   On: 03/27/2021 18:04   CT HEAD WO CONTRAST (5MM)  Result Date: 03/27/2021 CLINICAL DATA:  Head trauma, moderate to severe, fall EXAM: CT HEAD WITHOUT CONTRAST CT CERVICAL SPINE WITHOUT CONTRAST TECHNIQUE: Multidetector CT imaging of the head and cervical spine was performed following the standard protocol without intravenous contrast. Multiplanar CT image reconstructions of the cervical spine were also generated. RADIATION DOSE REDUCTION: This exam was performed according to the departmental dose-optimization program which includes automated  exposure control, adjustment of the mA and/or kV according to patient size and/or use of iterative reconstruction technique. COMPARISON:  02/09/2018 CT head, 09/12/2017 CT head and cervical spine FINDINGS: CT HEAD FINDINGS Brain: No evidence of acute infarction, hemorrhage, cerebral edema, mass, mass effect, or midline shift. No hydrocephalus or extra-axial fluid collection. Vascular: No hyperdense vessel. Skull: Normal. Negative for calvarial fracture or focal lesion. Sinuses/Orbits: Mild mucosal thickening in the left maxillary sinus, status post antrostomy. Otherwise clear. The mastoids are well aerated. No acute finding in the orbits. Other: Right frontal scalp and periorbital hematoma. CT CERVICAL SPINE FINDINGS Alignment: Physiologic. Skull base and vertebrae: No acute fracture. No primary bone lesion or focal pathologic process. Soft tissues and spinal canal: No prevertebral fluid or swelling. No visible canal hematoma. Disc levels: Discogenic degenerative changes. No spinal canal stenosis. Upper chest: No acute finding. Apical pleural-parenchymal scarring. Other: Subcentimeter hypoattenuating nodules in the thyroid. IMPRESSION: 1.  No intracranial large vessel occlusion or significant stenosis. 2.  No acute fracture or traumatic listhesis in the cervical spine. 3. Subcentimeter nodules in the thyroid, for which no follow-up is recommended, given the size of the lesions. (Reference: J Am Coll Radiol. 2015 Feb;12(2): 143-50) Electronically Signed   By: Merilyn Baba M.D.   On: 03/27/2021 18:33   CT Cervical Spine Wo Contrast  Result Date: 03/27/2021 CLINICAL DATA:  Head trauma, moderate to severe, fall EXAM: CT HEAD WITHOUT CONTRAST CT CERVICAL SPINE WITHOUT CONTRAST TECHNIQUE: Multidetector CT imaging of the head and cervical spine was performed following the standard protocol without intravenous contrast. Multiplanar CT image reconstructions of the cervical spine were also generated. RADIATION DOSE  REDUCTION: This exam was performed according to the departmental dose-optimization program which includes automated exposure control, adjustment of the mA and/or kV according to patient size and/or use of iterative reconstruction technique. COMPARISON:  02/09/2018 CT head, 09/12/2017 CT head and cervical spine FINDINGS: CT HEAD FINDINGS Brain: No evidence of acute infarction, hemorrhage, cerebral edema, mass, mass effect, or midline shift. No hydrocephalus or extra-axial fluid collection. Vascular: No hyperdense vessel. Skull: Normal. Negative for calvarial fracture or focal lesion. Sinuses/Orbits: Mild mucosal thickening in the left maxillary sinus, status post antrostomy. Otherwise clear. The mastoids are well aerated. No acute finding in the orbits. Other: Right frontal scalp and periorbital hematoma. CT CERVICAL SPINE FINDINGS Alignment: Physiologic. Skull base and vertebrae: No acute fracture. No primary bone lesion or focal pathologic process. Soft tissues and spinal canal: No prevertebral fluid or swelling. No visible canal hematoma. Disc levels: Discogenic degenerative changes. No spinal canal stenosis. Upper chest: No acute finding. Apical pleural-parenchymal scarring. Other: Subcentimeter hypoattenuating nodules in the thyroid. IMPRESSION: 1.  No intracranial large  vessel occlusion or significant stenosis. 2.  No acute fracture or traumatic listhesis in the cervical spine. 3. Subcentimeter nodules in the thyroid, for which no follow-up is recommended, given the size of the lesions. (Reference: J Am Coll Radiol. 2015 Feb;12(2): 143-50) Electronically Signed   By: Merilyn Baba M.D.   On: 03/27/2021 18:33     PROCEDURES:  Critical Care performed: No  ..Laceration Repair  Date/Time: 03/27/2021 7:09 PM Performed by: Melvenia Needles, PA-C Authorized by: Melvenia Needles, PA-C   Consent:    Consent obtained:  Verbal   Consent given by:  Patient   Risks, benefits, and alternatives  were discussed: yes     Risks discussed:  Pain, poor cosmetic result and poor wound healing Universal protocol:    Test results available: yes     Imaging studies available: yes     Site/side marked: yes     Patient identity confirmed:  Verbally with patient Anesthesia:    Anesthesia method:  Topical application   Topical anesthetic:  LET Laceration details:    Location:  Face   Face location:  R eyebrow   Length (cm):  1   Depth (mm):  4 Pre-procedure details:    Preparation:  Patient was prepped and draped in usual sterile fashion Exploration:    Hemostasis achieved with:  LET   Contaminated: no   Treatment:    Area cleansed with:  Saline   Amount of cleaning:  Standard   Irrigation solution:  Sterile saline   Irrigation volume:  10   Irrigation method:  Syringe   Debridement:  None   Undermining:  None   Scar revision: no   Skin repair:    Repair method:  Tissue adhesive Approximation:    Approximation:  Close Repair type:    Repair type:  Simple Post-procedure details:    Dressing:  Open (no dressing)   Procedure completion:  Tolerated well, no immediate complications   MEDICATIONS ORDERED IN ED: Medications  morphine (PF) 4 MG/ML injection 4 mg (4 mg Intravenous Given 03/27/21 1752)  ondansetron (ZOFRAN) injection 4 mg (4 mg Intravenous Given 03/27/21 1752)  lidocaine-EPINEPHrine-tetracaine (LET) solution (3 mLs Topical Given 03/27/21 2010)  metoCLOPramide (REGLAN) injection 5 mg (5 mg Intravenous Given 03/27/21 2010)  HYDROmorphone (DILAUDID) injection 1 mg (1 mg Intravenous Given 03/27/21 2011)     IMPRESSION / MDM / [place] / ED COURSE  I reviewed the triage vital signs and the nursing notes.                              Differential diagnosis includes, but is not limited to, SDH, hematoma, shoulder dislocation, humerus fracture, cervical strain  The patient is on the cardiac monitor to evaluate for evidence of arrhythmia and/or significant  heart rate changes.  Geriatric patient with a ED evaluation of injury sustained following a mechanical fall.  Patient is evaluated for complaints, found to have a reassuring work-up overall.  No signs of a serious closed head injury.  Patient has a laceration over the right brow which is repaired using wound adhesive at her request.  Patient with a closed nondisplaced comminuted right humeral head fracture, which will be treated in the outpatient setting.  She is placed in appropriate arm sling at this time, and will follow-up with Ortho as directed.  Patient will be discharged home with prescriptions for hydrocodone and Zofran. Patient is to  follow up with orthopedics as needed or otherwise directed. Patient is given ED precautions to return to the ED for any worsening or new symptoms.Marland Kitchen  ----------------------------------------- 9:26 PM on 03/27/2021 ----------------------------------------- Patient along with husband have requested to remain in the hospital overnight for pain control. The patient feels she is unable to transfer, ambulate, and control pain, independently, at this time.  Care is transferred to my attending, E. Bradler at this time.   FINAL CLINICAL IMPRESSION(S) / ED DIAGNOSES   Final diagnoses:  Contusion of face, initial encounter  Minor head injury, initial encounter  Humeral head fracture, right, closed, initial encounter     Rx / DC Orders   ED Discharge Orders     None        Note:  This document was prepared using Dragon voice recognition software and may include unintentional dictation errors.    Melvenia Needles, PA-C 03/27/21 2129    Vladimir Crofts, MD 03/28/21 [phone]

## 2021-03-27 NOTE — ED Triage Notes (Addendum)
Pt tripped and fell on crack in sidewalk going into grocery store.  Right shoulder and arm extremely painful.  Right eyebrow with swelling and bleeding noted.  Pt is on eliquis.  Given 50 mcg fentanyl and 4 zofran en route.  ?

## 2021-03-27 NOTE — Discharge Instructions (Addendum)
Please reach out to the orthopedic clinic during daytime hours to schedule an appointment to be seen to discuss surgery for your humerus fracture.  You should be seen in the clinic in the next 1-2 weeks. ? ?Wear the sling as directed to support the right arm.  You may take the sling off to shower or change clothes.  But please keep it on otherwise, including while you are sleeping. ? ?Use Tylenol for pain and fevers.  Up to 1000 mg per dose, up to 4 times per day.  Do not take more than 4000 mg of Tylenol/acetaminophen within 24 hours.. ? ?Use Oxycodone every 4-8hours as needed for more severe pain.  ? ?Please use lidocaine patches at your site of pain.  Apply 1 patch at a time, leave on for 12 hours, then remove for 12 hours.  12 hours on, 12 hours off.  Do not apply more than 1 patch at a time. ? ?Use Ice. 84-16 minutes per application  ? ?

## 2021-03-28 MED ORDER — LIDOCAINE 5 % EX PTCH: 1.0000 | MEDICATED_PATCH | Freq: Two times a day (BID) | CUTANEOUS | 0 refills | Status: AC

## 2021-03-28 MED ORDER — OXYCODONE HCL 5 MG PO TABS: 5.0000 mg | ORAL_TABLET | Freq: Three times a day (TID) | ORAL | 0 refills | Status: AC | PRN

## 2021-04-02 ENCOUNTER — Other Ambulatory Visit: Payer: Self-pay | Admitting: Orthopedic Surgery

## 2021-04-02 ENCOUNTER — Ambulatory Visit
Admission: RE | Admit: 2021-04-02 | Discharge: 2021-04-02 | Disposition: A | Discharge status: Home / Self Care | Payer: Medicare Other | Source: Ambulatory Visit | Attending: Orthopedic Surgery | Admitting: Orthopedic Surgery

## 2021-04-02 DIAGNOSIS — S42291A Other displaced fracture of upper end of right humerus, initial encounter for closed fracture: Secondary | ICD-10-CM | POA: Insufficient documentation

## 2021-04-03 ENCOUNTER — Telehealth: Payer: Self-pay | Admitting: Internal Medicine

## 2021-04-03 NOTE — Telephone Encounter (Signed)
? ?  Pre-operative Risk Assessment  ?  ?Patient Name: YASAMAN KOLEK  ?DOB: [birth year] ?MRN: [id number]  ? ?  ? ?Request for Surgical Clearance   ? ?Procedure:  Reverse Shoulder Arthroplasty for fractures, biceps tenodesis ? ?Date of Surgery:  Clearance 04/09/21                              ?   ?Surgeon:  Leim Fabry ?Surgeon's Group or Practice Name:  [facility] ?Phone number:  [phone] ?Fax number:  [phone] ?  ?Type of Clearance Requested:   ?- Medical  Lovenox after surgery ?  ?Type of Anesthesia:  not listed ?  ?Additional requests/questions:   ? ?Signed, ?Pilar A Ham   ?04/03/2021, 11:29 AM   ?

## 2021-04-03 NOTE — Telephone Encounter (Signed)
Alejandra Wiley. Spivak "Alejandra Wiley" is requesting preoperative cardiac evaluation for reverse shoulder arthroplasty for fracture as well as biceps tenodesis.  She was last seen in the clinic on 03/17/2021.  During that time she reported an episode where she became weak while walking.  Her heart rate was noted to be 90.  Her device was interrogated and nothing was detected.  She was not noted to have any interval syncope.  It was felt that her exercise tolerance may be limited due to her first-degree AV block. ? ?Her PMH includes bradycardia, vaginal cancer, CHF, hypertension, PPM 2005, PAF, SSS, and CVA. ? ?There is a plan for Lovenox postoperatively. ? ?Please provide cardiac recommendations for her upcoming surgery scheduled for 04/09/2021. ? ? ?Thank you for your help.  Please direct response to CV DIV preop pool. ? ?Jossie Ng. Dwayne Begay NP-C ? ?  ?04/03/2021, 11:48 AM ?[address] ?[address] ?Office [phone] Fax [phone] ? ?

## 2021-04-06 NOTE — Telephone Encounter (Signed)
[place] patient is calling for status update.  ? ?

## 2021-04-07 ENCOUNTER — Encounter
Admission: RE | Admit: 2021-04-07 | Discharge: 2021-04-07 | Disposition: A | Discharge status: Home / Self Care | Payer: Medicare Other | Source: Ambulatory Visit | Attending: Orthopedic Surgery | Admitting: Orthopedic Surgery

## 2021-04-07 ENCOUNTER — Encounter: Payer: Self-pay | Admitting: Orthopedic Surgery

## 2021-04-07 ENCOUNTER — Other Ambulatory Visit: Payer: Self-pay

## 2021-04-07 VITALS — BP 155/62 | Resp 20 | Ht 66.0 in | Wt 125.0 lb

## 2021-04-07 DIAGNOSIS — Z01812 Encounter for preprocedural laboratory examination: Secondary | ICD-10-CM | POA: Diagnosis present

## 2021-04-07 DIAGNOSIS — R799 Abnormal finding of blood chemistry, unspecified: Secondary | ICD-10-CM | POA: Diagnosis not present

## 2021-04-07 DIAGNOSIS — Z8614 Personal history of Methicillin resistant Staphylococcus aureus infection: Secondary | ICD-10-CM | POA: Diagnosis not present

## 2021-04-07 HISTORY — DX: Anxiety disorder, unspecified: F41.9

## 2021-04-07 HISTORY — DX: Other complications of anesthesia, initial encounter: T88.59XA

## 2021-04-07 HISTORY — DX: Unspecified osteoarthritis, unspecified site: M19.90

## 2021-04-07 LAB — TYPE AND SCREEN
ABO/RH(D): O POS
Antibody Screen: NEGATIVE

## 2021-04-07 LAB — URINALYSIS, ROUTINE W REFLEX MICROSCOPIC
Bilirubin Urine: NEGATIVE
Glucose, UA: NEGATIVE mg/dL
Ketones, ur: NEGATIVE mg/dL
Leukocytes,Ua: NEGATIVE
Nitrite: NEGATIVE
Protein, ur: NEGATIVE mg/dL
Specific Gravity, Urine: 1.025 (ref 1.005–1.030)
pH: 5 (ref 5.0–8.0)

## 2021-04-07 LAB — SURGICAL PCR SCREEN
MRSA, PCR: POSITIVE — AB
Staphylococcus aureus: POSITIVE — AB

## 2021-04-07 NOTE — Progress Notes (Signed)
?  Perioperative Services ? ?Abnormal Lab Notification and Treatment Plan of Care ? ?Date: 04/07/21 ? ?Name: Alejandra Wiley ?MRN:   [id number] ? ?Re: Abnormal labs noted during PAT appointment ? ?Provider Notified: Leim Fabry, MD ?Notification mode: Routed and/or faxed via [place] ? ?Labs of concern: ?Lab Results  ?Component Value Date  ? STAPHAUREUS POSITIVE (A) 04/07/2021  ? MRSAPCR POSITIVE (A) 04/07/2021  ?  ?Notes: Patient is scheduled for a RIGHT REVERSE SHOULDER ARTHROPLASTY FOR FRACTURE; BICEPS TENODESIS on 04/09/2021. She is scheduled to receive CEFAZOLIN pre-operatively. Surgical PCR (+) for MRSA; see above. ? ?PLANS:  ?Review renal function. Estimated Creatinine Clearance: 44.6 mL/min (by C-G formula based on SCr of 0.81 mg/dL). ?Review allergies. No documented allergy to vancomycin. ?Order added for VANCOMYCIN 1 gram to current preoperative prophylactic regimen.  ?Patient with orders for both CEFAZOLIN + VANCOMYCIN to be given in the setting of documented MRSA (+) surgical PCR.  ? ?Guidelines suggest that a beta-lactam antibiotic (first or second generation cephalosporin) should be added for activity against gram-negative organisms. ? ?Vancomycin appears to be less effective than cefazolin for preventing SSIs caused by MSSA. For this reason, the use of vancomycin in combination with cefazolin is favored for prevention of SSI due to MRSA and coagulase-negative staphylococci. ? ?Notify primary attending surgeon of (+) MRSA result and that order has been placed for the Vancomycin by perioperative APP.  ? ?This is a Community education officer; no formal response is required. ? ?Honor Loh, MSN, APRN, FNP-C, CEN ?[place]  ?Peri-operative Services Nurse Practitioner ?Phone: [phone] ?04/07/21 5:15 PM ? ?

## 2021-04-07 NOTE — Telephone Encounter (Signed)
Patient with diagnosis of afib on Eliquis for anticoagulation.   ? ?Procedure:  Reverse Shoulder Arthroplasty for fractures, biceps tenodesis ?Date of procedure: 04/09/21 ? ?Patient had a stroke while on warfarin, INR was subtherapeutic at the time. ? ? ?CHA2DS2-VASc Score = 7  ? This indicates a 11.2% annual risk of stroke. ?The patient's score is based upon: ?CHF History: 1 ?HTN History: 1 ?Diabetes History: 0 ?Stroke History: 2 ?Vascular Disease History: 0 ?Age Score: 2 ?Gender Score: 1 ?   ?CrCl 44 ml/min ?Platelet count 161 ? ?In surgeon note, it says that patient was previously bridge prior to surgery. I do not see documentation of this.  ?Clearance request says lovenox after surgery ? ?Patient is at higher risk due to hx of stroke. Patient did have subtherapeutic INR at that time. Will need to know anesthesia to determine length of hold.  ?ACC/AHA does not routine recommend bridge with DOAC. I will await determination of type of anesthesia. I will ask Dr. Quentin Ore to weigh in on thoughts of pre-operative bridge. ? ?

## 2021-04-07 NOTE — Telephone Encounter (Signed)
I s/w the pt and informed her that she has not been cleared and she will need an in office appt, per Dr. Quentin Ore pt had recent abnormal stress test. Pt is scheduled to see Christell Faith, [facility] 04/08/21 @ 1:55 in the [place] location. Pt tells me that her last dose of Eliquis was last night and she has not taken it today. Pt states snce she had not heard anything yet back about the surgery she called Dr. Serita Grit office and was advised to stop the Eliquis. I informed the pt that her cardiologist is who should be giving the instructions about the Eliquis as we will need to decide what will be an acceptable hold time for blood thinner as she will most likely need lovenox bridge as well. I informed the pt the reason why she did not hear anything yet was we had a question for the surgeon in regard to type of anesthesia being used as this is a very important part of [place] being able to give clearance. Pt is agreeable to plan of care and has appt tomorrow. I will forward these notes to [facility] for appt tomorrow. I will send FYI to surgeon's office the pt has appt 04/08/21. I did tell the pt that there may be a possibility that her surgery may need to be postponed. Pt asked now what should she do about her Eliquis. I assured the pt that I will send notes to Pharm-d as to what should the pt do about Eliquis at this time. I will call back once I have an answer. Pt said ok to leave detailed message on her cell phone.  ?

## 2021-04-07 NOTE — Telephone Encounter (Signed)
? ?  Name:  Alejandra Wiley  ?DOB:  Jan 26, 1932  ?MRN:  [id number]  ? ?Primary Cardiologist: [name], MD ? ?Chart reviewed as part of pre-operative protocol coverage.  ? ?Through direct communication with Dr. Quentin Ore and in review of her recent abnormal stress test, she is not cleared for surgery at this time. She will need to be evaluated in person.  ? ? ?Due to new or worsening symptoms, [name] will require a follow-up visit for further pre-operative risk assessment. ? ?Pre-op covering staff: ?- Please schedule appointment and call patient to inform them. If patient already had an upcoming appointment within acceptable timeframe, please add "pre-op clearance" to the appointment notes so provider is aware. ?- Please contact requesting surgeon's office via preferred method (i.e, phone, fax) to inform them of need for appointment prior to surgery. ? ?Ledora Bottcher, PA ?04/07/2021, 2:06 PM ? ? ? ?

## 2021-04-07 NOTE — Telephone Encounter (Signed)
We will plan to postpone surgery. My question is do we have any idea of how long? She has a fractured humerus so this should be something that's done relatively soon (ideally in the next week or two). I am also trying to find a time now to fit her into my surgical schedule. Do you think we can plan for 3/27 or 4/3?

## 2021-04-07 NOTE — Telephone Encounter (Signed)
Per Christell Faith, PAC who will be seeing the pt tomorrow, has stated to please let DR. Posey Pronto that the pt's surgery will need to be postponed until cleared by cardiology. I will fax notes to surgeon.  ?

## 2021-04-07 NOTE — Telephone Encounter (Signed)
Ebony Hail from Dr. Serita Grit office called with type of anesthesia being used: general/regular interscalene block with exparil.  ?

## 2021-04-07 NOTE — Telephone Encounter (Signed)
If not already done so, please let the surgeon's office know that her surgery will need to be postponed with date to be determined pending cardiac evaluation.  ?

## 2021-04-07 NOTE — Patient Instructions (Addendum)
Your procedure is scheduled on: 04/09/21 - Thursday ?Report to the Registration Desk on the 1st floor of the [place]. ?To find out your arrival time, please call [phone] between 1PM - 3PM on: 04/08/21- Wednesday ? ?REMEMBER: ?Instructions that are not followed completely may result in serious medical risk, up to and including death; or upon the discretion of your surgeon and anesthesiologist your surgery may need to be rescheduled. ? ?Do not eat food after midnight the night before surgery.  ?No gum chewing, lozengers or hard candies. ? ?You may however, drink CLEAR liquids up to 2 hours before you are scheduled to arrive for your surgery. Do not drink anything within 2 hours of your scheduled arrival time. ? ?Clear liquids include: ?- water  ?- apple juice without pulp ?- gatorade (not RED colors) ?- black coffee or tea (Do NOT add milk or creamers to the coffee or tea) ?Do NOT drink anything that is not on this list. ? ?Type 1 and Type 2 diabetics should only drink water. ? ?In addition, your doctor has ordered for you to drink the provided  ?Ensure Pre-Surgery Clear Carbohydrate Drink  ?Drinking this carbohydrate drink up to two hours before surgery helps to reduce insulin resistance and improve patient outcomes. Please complete drinking 2 hours prior to scheduled arrival time. ? ?TAKE THESE MEDICATIONS THE MORNING OF SURGERY WITH A SIP OF WATER: ? ?- amiodarone (PACERONE) 200 MG tablet ?- metoprolol succinate (TOPROL-XL) 50 MG 24 hr tablet ? ?Follow recommendations from Cardiologist, Pulmonologist or PCP regarding stopping Aspirin, Coumadin, Plavix, Eliquis, Pradaxa, or Pletal.  ? ?One week prior to surgery: ?Stop Anti-inflammatories (NSAIDS) such as Advil, Aleve, Ibuprofen, Motrin, Naproxen, Naprosyn and Aspirin based products such as Excedrin, Goodys Powder, BC Powder. ? ?Stop ANY OVER THE COUNTER supplements until after surgery. ? ?You may however, continue to take Tylenol if needed for pain up  until the day of surgery. ? ?No Alcohol for 24 hours before or after surgery. ? ?No Smoking including e-cigarettes for 24 hours prior to surgery.  ?No chewable tobacco products for at least 6 hours prior to surgery.  ?No nicotine patches on the day of surgery. ? ?Do not use any "recreational" drugs for at least a week prior to your surgery.  ?Please be advised that the combination of cocaine and anesthesia may have negative outcomes, up to and including death. ?If you test positive for cocaine, your surgery will be cancelled. ? ?On the morning of surgery brush your teeth with toothpaste and water, you may rinse your mouth with mouthwash if you wish. ?Do not swallow any toothpaste or mouthwash. ? ?Use CHG Soap or wipes as directed on instruction sheet. ? ?Do not wear jewelry, make-up, hairpins, clips or nail polish. ? ?Do not wear lotions, powders, or perfumes.  ? ?Do not shave body from the neck down 48 hours prior to surgery just in case you cut yourself which could leave a site for infection.  ?Also, freshly shaved skin may become irritated if using the CHG soap. ? ?Contact lenses, hearing aids and dentures may not be worn into surgery. ? ?Do not bring valuables to the hospital. [facility] is not responsible for any missing/lost belongings or valuables.  ? ?Total Shoulder Arthroplasty:  use Benzolyl Peroxide 5% Gel as directed on instruction sheet. ? ?Notify your doctor if there is any change in your medical condition (cold, fever, infection). ? ?Wear comfortable clothing (specific to your surgery type) to the hospital. ? ?After  surgery, you can help prevent lung complications by doing breathing exercises.  ?Take deep breaths and cough every 1-2 hours. Your doctor may order a device called an Incentive Spirometer to help you take deep breaths. ?When coughing or sneezing, hold a pillow firmly against your incision with both hands. This is called ?splinting.? Doing this helps protect your incision. It also  decreases belly discomfort. ? ?If you are being admitted to the hospital overnight, leave your suitcase in the car. ?After surgery it may be brought to your room. ? ?If you are being discharged the day of surgery, you will not be allowed to drive home. ?You will need a responsible adult (18 years or older) to drive you home and stay with you that night.  ? ?If you are taking public transportation, you will need to have a responsible adult (18 years or older) with you. ?Please confirm with your physician that it is acceptable to use public transportation.  ? ?Please call the [place] Dept. at [phone] if you have any questions about these instructions. ? ?Surgery Visitation Policy: ? ?Patients undergoing a surgery or procedure may have two family members or support persons with them as long as the person is not COVID-19 positive or experiencing its symptoms.  ? ?Inpatient Visitation:   ? ?Visiting hours are 7 a.m. to 8 p.m. ?Up to four visitors are allowed at one time in a patient room, including children. The visitors may rotate out with other people during the day. One designated support person (adult) may remain overnight. ? ?All Areas: ?All visitors must pass COVID-19 screenings, use hand sanitizer when entering and exiting the patient?s room and wear a mask at all times, including in the patient?s room. ?Patients must also wear a mask when staff or their visitor are in the room. ?Masking is required regardless of vaccination status.  ?

## 2021-04-07 NOTE — Telephone Encounter (Signed)
Dr. Posey Pronto has sent a message about the anesthesia: I will put his notes in the chart. ? ? ?Message from Dr. Posey Pronto ?General anesthesia with intubation. Usually from my end, I request 2-3 days off of eliquis and I am OK with restarting on postop day #1. anesthesia team also usually gives a nerve block so they need minimal (if any) narcotics for pain intraop and the first few days ?

## 2021-04-07 NOTE — Telephone Encounter (Signed)
Patient should resume taking Eliquis. Surgery will have to be postponed. If patient chooses to continue to hold Eliquis in attempts to have surgery on the 23rd she will have to accept the stroke risk as she has not been cleared to hold anticoagulation by Dr. Quentin Ore.  ? ?

## 2021-04-07 NOTE — Telephone Encounter (Signed)
The surgery states Choice anesthesia is listed. I will have to call the office to see if they can give more specifics if it has been decided yet.  ? ?I did call Dr. Serita Grit office. I was placed into the vm for the surgery scheduler for Dr. Posey Pronto. I left message that our Pharm-D is needing to know what type of anesthesia is going to be used, this is information is needed to due to [place]. Need information ASAP as surgery is planned for 04/09/21. I will fax these notes over to Dr. Posey Pronto office as well to please call our office with type of anesthesia.  ?

## 2021-04-07 NOTE — Telephone Encounter (Signed)
Pre op team- Dr. Caryl Comes is currently out [place] the [place] and I do not think this will be responded to by him prior to the patient's surgery.  ? ?He did not tell me that he was signing out his basket to another provider.  ? ?

## 2021-04-07 NOTE — Telephone Encounter (Signed)
[place] is calling back to check the status of pre op clearance.

## 2021-04-07 NOTE — Progress Notes (Signed)
? ?Cardiology Office Note   ? ?Date:  04/08/2021  ? ?ID:  Murrell Redden, DOB 1932/12/21, MRN [id number] ? ?PCP:  Kirk Ruths, MD  ?Cardiologist:  [name], MD  ?Electrophysiologist:  [name], MD  ? ?Chief Complaint: Preoperative cardiac risk stratification  ? ?History of Present Illness:  ? ?Alejandra Wiley is a 88 y.o. female with history of chronic combined systolic and diastolic CHF, sinus node dysfunction status with atrial arrhythmias as well as bradycardia previously on flecainide resulting in prolonged AV nodal conduction post PPM in 2005 with generator change out in 2014, PAF, CVA with subtherapeutic INR on warfarin, LBBB, Covid in 09/2020, and HTN who presents for preoperative cardiac risk stratification.  ? ?Echo in 06/2017 demonstrated an EF of 50 to 55%, normal wall motion, grade 2 diastolic dysfunction, mild to moderate mitral regurgitation, normal-sized left atrium, normal RV systolic function, mildly dilated right atrium, PASP 44 mmHg. ?  ?Echo in 08/2017 demonstrated an EF of 50 to 55%, no regional wall motion abnormalities, moderate to severe mitral regurgitation, mildly dilated left atrium, mildly dilated right atrium, PASP 38 mmHg. ?  ?Echo in 01/2018, in the context of CVA with subtherapeutic INR at 1.5 to, demonstrated an EF of 30 to 35%, diffuse hypokinesis, grade 1 diastolic dysfunction, moderately dilated RV, and mildly dilated RA.  CT head showed no acute stroke.  MRI was not performed due to pacemaker.  Carotid artery ultrasound demonstrated minimal heterogeneous and calcified plaque with no hemodynamically significant stenosis.  Given LV dysfunction, flecainide was discontinued. ?  ?Repeat echo in 11/2018 demonstrated an improved LVSF with an EF of 55%, possible mild concentric LVH, grade 1 diastolic dysfunction, normal RV systolic function and ventricular cavity size, mildly dilated left atrium, mild to moderate tricuspid regurgitation, mildly elevated PASP estimated at  37.7 mmHg. ?  ?She was admitted in 04/2020 with weakness and found to have A. fib with RVR leading to an increase in nadolol.  Echo at that time demonstrated an EF of 50%, no regional wall motion abnormalities, mild LVH, grade 2 diastolic dysfunction, low normal RV systolic function with normal ventricular cavity size, moderately dilated left atrium, mild to moderate mitral regurgitation, and an estimated right atrial pressure of 3 mmHg. ?  ?Throughout the summer 2022 she was having increased palpitations.  In follow-up with EP in 07/2020 note indicated no interval A. fib.  Treatment options were discussed including continuing rate control, antiarrhythmic therapy with either dofetilide or amiodarone versus catheter ablation.  Primary electrophysiologist indicated he would discuss this with one of his partners. ?  ?She was seen in the office, by her primary electrophysiologist, on 12/16/2020 and reported she was feeling much better on higher dose nadolol.  She did report exertional dyspnea.  She was continued on nadolol 60 mg twice daily.  Orthostatic hypotension has precluded further escalation of this medication.  It was recommended digoxin to be added, though delayed until after her upcoming wedding in early 2023. ?  ?She contacted our office earlier this morning reporting fatigue and weakness and felt like her heart was "out of rhythm."  Documented heart rate of 98 bpm with a BP of 132/86.  Given this, she was scheduled for available appointment today. ? ?She was seen in the office in 12/2020 with chest pain, dyspnea, fatigue and weakness.  She was noted to be in A-fib with RVR.  She was also noted to have a left bundle branch block of uncertain chronicity.  She was  subsequently admitted with symptomatic improvement with diuresis and improved rate control.  Lexiscan MPI on 12/30/2020 was abnormal, potentially high risk with a large in size, moderate in severity, fixed defect involving the septum that was felt to  most likely be due to underlying LBBB, though could not exclude scar.  LVEF was reduced at less than 30% with global hypokinesis.  There was no significant coronary artery calcification.  Aortic atherosclerosis as well as small to moderate bilateral pleural effusions with the right being greater than the left were noted.  Echo was performed to correlate EF on 12/31/2020, which demonstrated an EF of 45%, global hypokinesis, grade 2 diastolic dysfunction, normal RV systolic function and ventricular cavity size, mild mitral regurgitation, and an estimated right atrial pressure of 3 mmHg.  With regards to her A-fib, she was started on amiodarone and nadolol was discontinued.  Short acting metoprolol was added. ? ?She followed up with her primary cardiologist on 03/17/2021 and was doing well from a cardiac perspective.  It was felt her cardiomyopathy was rate related with recommendation to obtain an echo in May 2023.  Device interrogation demonstrated episodes of A-fib/flutter with recommendation to continue amiodarone. ? ?She was seen in the ED on 03/27/2021 after sustaining a mechanical fall, apparently slipping and falling on the sidewalk at a grocery store.  She landed on her right side.  CT head showed no intracranial large vessel process or significant stenosis.  Plain film of the right shoulder showed a comminuted right humeral head and neck fracture.  She subsequently followed up with orthopedic surgery with CT of the shoulder showing a comminuted fracture of the right humeral neck that extended into both tuberosities and demonstrated mildly increased displacement and angulation when compared with prior radiographs.  Orthopedic surgery has recommended surgical repair with general anesthesia with intubation.  It is noted anesthesia may give a nerve block as well.  It is requested Eliquis be interrupted for 2 to 3 days prior to the surgery with resumption on postop day #1. ? ?Her case was discussed with Dr. Quentin Ore  given Dr. Caryl Comes is currently out of the country, and she was not felt to be acceptable risk for noncardiac surgery and appointment was recommended for today.  ? ?She comes in accompanied by her husband today and is without symptoms of angina or decompensation.  She has been off her apixaban since the evening of 3/20.  She remains very active at baseline, was performing yoga and ambulating 1/2 mile daily with her husband prior to this fall.  Outside of the above fall, she has not had any prior or subsequent falls.  No hematochezia or melena.  No lower extremity swelling or orthopnea.  She feels very well from a cardiac perspective. ? ?Revised Cardiac Risk Index: She is moderate risk for noncardiac surgery with an estimated rate of 6.6% for adverse cardiac in the perioperative time frame ? ?D[facility] Activity Status Index: She is able to achieve > 4 METs without cardiac limitation  ? ? ?Labs independently reviewed: ?03/2021 - HGB 10.0, PLT 161, potassium 3.8, BUN 20, SCr 0.81 ?02/2021 - albumin 4.1, AST/ALT normal. TSH normal ?12/2020 - magnesium 2.4 ?10/2020 - TC 196, TG 83, HDL 64, LDL 115 ? ?Past Medical History:  ?Diagnosis Date  ? Anxiety   ? Aortic atherosclerosis ([place])   ? Arthritis   ? Bradycardia   ? 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 a.) CNB (+) moderate epithelial hyperplasia without atypia and fibrocystic changes  ? CHF (  congestive heart failure) ([place])   ? Complication of anesthesia   ? heart stopped at the end of surgery with Dr. Rudene Christians during a back surgery in approx 2019.  ? Hypertension   ? Long term current use of anticoagulant   ? a.) dose reduced apixaban  ? MRSA nasal colonization 09/17/2016  ? Pacemaker 12/06/2003  ? Medtronic Impulse [id number]  ? Pacemaker 2015  ? PAF (paroxysmal atrial fibrillation) (HCC)   ? a.) CHA2DS2-VASc = 8 (age x 2, sex, CHF, HTN, CVA x 2, aortic plaque). b.) rate/rhythm maintained on oral amiodarone + metoprolol succinate; chronically anticoagulated with dose reduced apixaban   ? Sick sinus syndrome ([place])   ? Sinoatrial node dysfunction (HCC)   ? Stroke [facility])   ? Vaginal intraepithelial neoplasia   ? VAIN (vaginal intraepithelial neoplasia)   ? ? ?Past Surgical History:  ?Proced

## 2021-04-08 ENCOUNTER — Encounter: Payer: Self-pay | Admitting: Physician Assistant

## 2021-04-08 ENCOUNTER — Ambulatory Visit (INDEPENDENT_AMBULATORY_CARE_PROVIDER_SITE_OTHER): Payer: Medicare Other | Admitting: Physician Assistant

## 2021-04-08 ENCOUNTER — Encounter: Payer: Self-pay | Admitting: Internal Medicine

## 2021-04-08 VITALS — BP 140/60 | HR 71 | Ht 66.0 in | Wt 126.2 lb

## 2021-04-08 DIAGNOSIS — Z01810 Encounter for preprocedural cardiovascular examination: Secondary | ICD-10-CM

## 2021-04-08 DIAGNOSIS — I495 Sick sinus syndrome: Secondary | ICD-10-CM

## 2021-04-08 DIAGNOSIS — I10 Essential (primary) hypertension: Secondary | ICD-10-CM

## 2021-04-08 DIAGNOSIS — Z950 Presence of cardiac pacemaker: Secondary | ICD-10-CM

## 2021-04-08 DIAGNOSIS — I480 Paroxysmal atrial fibrillation: Secondary | ICD-10-CM | POA: Diagnosis not present

## 2021-04-08 DIAGNOSIS — Z8673 Personal history of transient ischemic attack (TIA), and cerebral infarction without residual deficits: Secondary | ICD-10-CM

## 2021-04-08 DIAGNOSIS — I5042 Chronic combined systolic (congestive) and diastolic (congestive) heart failure: Secondary | ICD-10-CM | POA: Diagnosis not present

## 2021-04-08 DIAGNOSIS — I951 Orthostatic hypotension: Secondary | ICD-10-CM

## 2021-04-08 DIAGNOSIS — I447 Left bundle-branch block, unspecified: Secondary | ICD-10-CM

## 2021-04-08 NOTE — Progress Notes (Unsigned)
PERIOPERATIVE PRESCRIPTION FOR IMPLANTED CARDIAC DEVICE PROGRAMMING ? ?Patient Information: ?Name:  Alejandra Wiley  ?DOB:  1932-05-08  ?MRN:  [id number]  ?{TIP - You do not have to delete this tip  -  Copy the info from the staff message sent by the PAT staff  then press F2 here and paste the information using CTL - V on the next line :[id number]}  ?Planned Procedure: RIGHT REVERSE SHOULDER ARTHROPLASTY FOR FRACTURE; BICEPS TENODESIS  ? ?Surgeon:  Dr. Leim Fabry, MD  ?Requesting device clearance: Honor Loh, FNP-C  ?Date of Procedure:  04/09/2021  ?Cautery will be used.  ? ?Please route documentation back me via [facility], or may fax report to [facility] PAT APP at [phone].   ?Device Information: ? ?Clinic EP Physician:  [name], MD  ? ?Device Type:  Pacemaker ?Manufacturer and Phone #:  Medtronic: [phone] ?Pacemaker Dependent?:  No. ?Date of Last Device Check:  03/18/2021 Normal Device Function?:  Yes.   ? ?Electrophysiologist's Recommendations: ? ?Have magnet available. ?Provide continuous ECG monitoring when magnet is used or reprogramming is to be performed.  ?Procedure may interfere with device function.  Magnet should be placed over device during procedure. ? ?Per Device Clinic Standing Orders, ?Simone Curia, RN  ?9:41 AM 04/08/2021  ?

## 2021-04-08 NOTE — Telephone Encounter (Signed)
See OV from today for details. ?

## 2021-04-08 NOTE — Patient Instructions (Signed)
Medication Instructions:  ?Restart Eliquis when orthopedic surgeon approves.  ? ?*If you need a refill on your cardiac medications before your next appointment, please call your pharmacy* ? ? ?Lab Work: ?None ? ?If you have labs (blood work) drawn today and your tests are completely normal, you will receive your results only by: ?MyChart Message (if you have MyChart) OR ?A paper copy in the mail ?If you have any lab test that is abnormal or we need to change your treatment, we will call you to review the results. ? ? ?Testing/Procedures: ?None ? ? ?Follow-Up: ?At [facility], you and your health needs are our priority.  As part of our continuing mission to provide you with exceptional heart care, we have created designated Provider Care Teams.  These Care Teams include your primary Cardiologist (physician) and Advanced Practice Providers (APPs -  Physician Assistants and Nurse Practitioners) who all work together to provide you with the care you need, when you need it. ? ? ?Your next appointment:   ?2 month(s) ? ?The format for your next appointment:   ?In Person ? ?Provider:   ?[name], MD or Christell Faith, PA-C ?

## 2021-04-09 ENCOUNTER — Inpatient Hospital Stay: Payer: Medicare Other | Admitting: Urgent Care

## 2021-04-09 ENCOUNTER — Other Ambulatory Visit: Payer: Self-pay

## 2021-04-09 ENCOUNTER — Observation Stay: Payer: Medicare Other

## 2021-04-09 ENCOUNTER — Encounter: Payer: Self-pay | Admitting: Orthopedic Surgery

## 2021-04-09 ENCOUNTER — Encounter
Admission: RE | Disposition: A | Discharge status: Home Health | Payer: Self-pay | Source: Home / Self Care | Attending: Orthopedic Surgery

## 2021-04-09 ENCOUNTER — Inpatient Hospital Stay: Payer: Medicare Other

## 2021-04-09 ENCOUNTER — Observation Stay
Admission: RE | Admit: 2021-04-09 | Discharge: 2021-04-10 | Disposition: A | Discharge status: Home Health | Payer: Medicare Other | Attending: Orthopedic Surgery | Admitting: Orthopedic Surgery

## 2021-04-09 DIAGNOSIS — I110 Hypertensive heart disease with heart failure: Secondary | ICD-10-CM | POA: Insufficient documentation

## 2021-04-09 DIAGNOSIS — Z91040 Latex allergy status: Secondary | ICD-10-CM | POA: Diagnosis not present

## 2021-04-09 DIAGNOSIS — I509 Heart failure, unspecified: Secondary | ICD-10-CM | POA: Insufficient documentation

## 2021-04-09 DIAGNOSIS — S42201A Unspecified fracture of upper end of right humerus, initial encounter for closed fracture: Secondary | ICD-10-CM | POA: Diagnosis not present

## 2021-04-09 DIAGNOSIS — W19XXXA Unspecified fall, initial encounter: Secondary | ICD-10-CM | POA: Insufficient documentation

## 2021-04-09 DIAGNOSIS — Z950 Presence of cardiac pacemaker: Secondary | ICD-10-CM | POA: Insufficient documentation

## 2021-04-09 DIAGNOSIS — R2681 Unsteadiness on feet: Secondary | ICD-10-CM | POA: Diagnosis not present

## 2021-04-09 DIAGNOSIS — Z9181 History of falling: Secondary | ICD-10-CM | POA: Diagnosis not present

## 2021-04-09 DIAGNOSIS — Z7901 Long term (current) use of anticoagulants: Secondary | ICD-10-CM | POA: Diagnosis not present

## 2021-04-09 DIAGNOSIS — S42209A Unspecified fracture of upper end of unspecified humerus, initial encounter for closed fracture: Principal | ICD-10-CM | POA: Diagnosis present

## 2021-04-09 HISTORY — PX: BICEPT TENODESIS: SHX5116

## 2021-04-09 HISTORY — DX: Atherosclerosis of aorta: I70.0

## 2021-04-09 HISTORY — DX: Dysplasia of vagina, unspecified: N89.3

## 2021-04-09 HISTORY — DX: Long term (current) use of anticoagulants: Z79.01

## 2021-04-09 HISTORY — PX: REVERSE SHOULDER ARTHROPLASTY: SHX5054

## 2021-04-09 SURGERY — ARTHROPLASTY, SHOULDER, TOTAL, REVERSE
Anesthesia: General | Site: Shoulder | Laterality: Right

## 2021-04-09 MED ORDER — SODIUM CHLORIDE 0.9 % IV SOLN: INTRAVENOUS | Status: DC

## 2021-04-09 MED ORDER — BUPIVACAINE LIPOSOME 1.3 % IJ SUSP
INTRAMUSCULAR | Status: DC | PRN
  Administered 2021-04-09: 10 mL via PERINEURAL

## 2021-04-09 MED ORDER — BUPIVACAINE-EPINEPHRINE (PF) 0.25% -1:200000 IJ SOLN
INTRAMUSCULAR | Status: AC
  Filled 2021-04-09: qty 30

## 2021-04-09 MED ORDER — FENTANYL CITRATE (PF) 100 MCG/2ML IJ SOLN: 25.0000 ug | INTRAMUSCULAR | Status: DC | PRN

## 2021-04-09 MED ORDER — HYDROMORPHONE HCL 1 MG/ML IJ SOLN: 0.2000 mg | INTRAMUSCULAR | Status: DC | PRN

## 2021-04-09 MED ORDER — LIDOCAINE HCL (CARDIAC) PF 100 MG/5ML IV SOSY
PREFILLED_SYRINGE | INTRAVENOUS | Status: DC | PRN
  Administered 2021-04-09: 40 mg via INTRAVENOUS

## 2021-04-09 MED ORDER — 0.9 % SODIUM CHLORIDE (POUR BTL) OPTIME
TOPICAL | Status: DC | PRN
  Administered 2021-04-09: 1000 mL

## 2021-04-09 MED ORDER — BLISTEX MEDICATED EX OINT
TOPICAL_OINTMENT | CUTANEOUS | Status: DC | PRN
  Filled 2021-04-09: qty 6.3

## 2021-04-09 MED ORDER — OXYCODONE HCL 5 MG/5ML PO SOLN: 5.0000 mg | Freq: Once | ORAL | Status: DC | PRN

## 2021-04-09 MED ORDER — METOCLOPRAMIDE HCL 5 MG/ML IJ SOLN: 5.0000 mg | Freq: Three times a day (TID) | INTRAMUSCULAR | Status: DC | PRN

## 2021-04-09 MED ORDER — METOCLOPRAMIDE HCL 10 MG PO TABS: 5.0000 mg | ORAL_TABLET | Freq: Three times a day (TID) | ORAL | Status: DC | PRN

## 2021-04-09 MED ORDER — NEOMYCIN-POLYMYXIN B GU 40-200000 IR SOLN
Status: DC | PRN
  Administered 2021-04-09: 16 mL

## 2021-04-09 MED ORDER — METOPROLOL SUCCINATE ER 50 MG PO TB24
50.0000 mg | ORAL_TABLET | Freq: Every day | ORAL | Status: DC
  Administered 2021-04-10: 50 mg via ORAL
  Filled 2021-04-09: qty 1

## 2021-04-09 MED ORDER — ORAL CARE MOUTH RINSE: 15.0000 mL | Freq: Once | OROMUCOSAL | Status: AC

## 2021-04-09 MED ORDER — FENTANYL CITRATE (PF) 100 MCG/2ML IJ SOLN
INTRAMUSCULAR | Status: AC
  Filled 2021-04-09: qty 2

## 2021-04-09 MED ORDER — ONDANSETRON HCL 4 MG/2ML IJ SOLN: 4.0000 mg | Freq: Once | INTRAMUSCULAR | Status: DC | PRN

## 2021-04-09 MED ORDER — PROPOFOL 10 MG/ML IV BOLUS
INTRAVENOUS | Status: AC
  Filled 2021-04-09: qty 20

## 2021-04-09 MED ORDER — MIDAZOLAM HCL 2 MG/2ML IJ SOLN
INTRAMUSCULAR | Status: AC
  Administered 2021-04-09: 1 mg via INTRAVENOUS
  Filled 2021-04-09: qty 2

## 2021-04-09 MED ORDER — NEOMYCIN-POLYMYXIN B GU 40-200000 IR SOLN
Status: AC
  Filled 2021-04-09: qty 20

## 2021-04-09 MED ORDER — SUCCINYLCHOLINE CHLORIDE 200 MG/10ML IV SOSY
PREFILLED_SYRINGE | INTRAVENOUS | Status: AC
  Filled 2021-04-09: qty 10

## 2021-04-09 MED ORDER — STERILE WATER FOR IRRIGATION IR SOLN
Status: DC | PRN
  Administered 2021-04-09: 1000 mL

## 2021-04-09 MED ORDER — ONDANSETRON HCL 4 MG/2ML IJ SOLN
INTRAMUSCULAR | Status: DC | PRN
  Administered 2021-04-09: 4 mg via INTRAVENOUS

## 2021-04-09 MED ORDER — CEFAZOLIN SODIUM-DEXTROSE 2-4 GM/100ML-% IV SOLN
2.0000 g | Freq: Four times a day (QID) | INTRAVENOUS | Status: AC
  Administered 2021-04-09 – 2021-04-10 (×3): 2 g via INTRAVENOUS
  Filled 2021-04-09 (×3): qty 100

## 2021-04-09 MED ORDER — DOCUSATE SODIUM 100 MG PO CAPS
100.0000 mg | ORAL_CAPSULE | Freq: Two times a day (BID) | ORAL | Status: DC
  Administered 2021-04-09 – 2021-04-10 (×3): 100 mg via ORAL
  Filled 2021-04-09 (×3): qty 1

## 2021-04-09 MED ORDER — ONDANSETRON HCL 4 MG/2ML IJ SOLN
INTRAMUSCULAR | Status: AC
Start: 2021-04-09 — End: ?
  Filled 2021-04-09: qty 2

## 2021-04-09 MED ORDER — ONDANSETRON HCL 4 MG PO TABS: 4.0000 mg | ORAL_TABLET | Freq: Four times a day (QID) | ORAL | Status: DC | PRN

## 2021-04-09 MED ORDER — BUPIVACAINE HCL (PF) 0.5 % IJ SOLN
INTRAMUSCULAR | Status: AC
  Filled 2021-04-09: qty 10

## 2021-04-09 MED ORDER — PHENYLEPHRINE HCL (PRESSORS) 10 MG/ML IV SOLN
INTRAVENOUS | Status: AC
  Filled 2021-04-09: qty 1

## 2021-04-09 MED ORDER — SODIUM CHLORIDE 0.9 % IR SOLN
Status: DC | PRN
  Administered 2021-04-09: 3000 mL

## 2021-04-09 MED ORDER — PHENYLEPHRINE HCL-NACL 20-0.9 MG/250ML-% IV SOLN
INTRAVENOUS | Status: DC | PRN
  Administered 2021-04-09: 40 ug/min via INTRAVENOUS

## 2021-04-09 MED ORDER — CEFAZOLIN SODIUM-DEXTROSE 2-4 GM/100ML-% IV SOLN
INTRAVENOUS | Status: AC
  Filled 2021-04-09: qty 100

## 2021-04-09 MED ORDER — FENTANYL CITRATE PF 50 MCG/ML IJ SOSY
PREFILLED_SYRINGE | INTRAMUSCULAR | Status: AC
  Administered 2021-04-09: 50 ug via INTRAVENOUS
  Filled 2021-04-09: qty 1

## 2021-04-09 MED ORDER — VANCOMYCIN HCL IN DEXTROSE 1-5 GM/200ML-% IV SOLN: 1000.0000 mg | Freq: Once | INTRAVENOUS | Status: AC

## 2021-04-09 MED ORDER — OXYCODONE HCL 5 MG PO TABS: 5.0000 mg | ORAL_TABLET | ORAL | Status: DC | PRN

## 2021-04-09 MED ORDER — ALUM & MAG HYDROXIDE-SIMETH 200-200-20 MG/5ML PO SUSP: 30.0000 mL | ORAL | Status: DC | PRN

## 2021-04-09 MED ORDER — FENTANYL CITRATE PF 50 MCG/ML IJ SOSY: 50.0000 ug | PREFILLED_SYRINGE | Freq: Once | INTRAMUSCULAR | Status: AC

## 2021-04-09 MED ORDER — BUPIVACAINE LIPOSOME 1.3 % IJ SUSP
INTRAMUSCULAR | Status: AC
  Filled 2021-04-09: qty 20

## 2021-04-09 MED ORDER — ACETAMINOPHEN 10 MG/ML IV SOLN
INTRAVENOUS | Status: AC
  Filled 2021-04-09: qty 100

## 2021-04-09 MED ORDER — LACTATED RINGERS IV SOLN: INTRAVENOUS | Status: DC

## 2021-04-09 MED ORDER — MIDAZOLAM HCL 2 MG/2ML IJ SOLN: 0.5000 mg | Freq: Once | INTRAMUSCULAR | Status: AC

## 2021-04-09 MED ORDER — SENNOSIDES-DOCUSATE SODIUM 8.6-50 MG PO TABS
1.0000 | ORAL_TABLET | Freq: Every evening | ORAL | Status: DC | PRN
  Administered 2021-04-10: 1 via ORAL
  Filled 2021-04-09: qty 1

## 2021-04-09 MED ORDER — VANCOMYCIN HCL 1000 MG IV SOLR
INTRAVENOUS | Status: DC | PRN
Start: 2021-04-09 — End: 2021-04-09
  Administered 2021-04-09: 1000 mg via TOPICAL

## 2021-04-09 MED ORDER — AMIODARONE HCL 200 MG PO TABS
200.0000 mg | ORAL_TABLET | Freq: Every day | ORAL | Status: DC
  Administered 2021-04-10: 200 mg via ORAL
  Filled 2021-04-09: qty 1

## 2021-04-09 MED ORDER — PROPOFOL 10 MG/ML IV BOLUS
INTRAVENOUS | Status: DC | PRN
Start: 2021-04-09 — End: 2021-04-09
  Administered 2021-04-09: 80 mg via INTRAVENOUS

## 2021-04-09 MED ORDER — CEFAZOLIN SODIUM-DEXTROSE 2-4 GM/100ML-% IV SOLN
2.0000 g | INTRAVENOUS | Status: AC
  Administered 2021-04-09: 2 g via INTRAVENOUS

## 2021-04-09 MED ORDER — VANCOMYCIN HCL IN DEXTROSE 1-5 GM/200ML-% IV SOLN
INTRAVENOUS | Status: AC
Start: 2021-04-09 — End: 2021-04-10
  Administered 2021-04-09: 1000 mg via INTRAVENOUS
  Filled 2021-04-09: qty 200

## 2021-04-09 MED ORDER — ACETAMINOPHEN 10 MG/ML IV SOLN
INTRAVENOUS | Status: DC | PRN
  Administered 2021-04-09: 1000 mg via INTRAVENOUS

## 2021-04-09 MED ORDER — APIXABAN 2.5 MG PO TABS
2.5000 mg | ORAL_TABLET | Freq: Two times a day (BID) | ORAL | Status: DC
Start: 2021-04-10 — End: 2021-04-10
  Administered 2021-04-10: 2.5 mg via ORAL
  Filled 2021-04-09: qty 1

## 2021-04-09 MED ORDER — PHENYLEPHRINE HCL (PRESSORS) 10 MG/ML IV SOLN
INTRAVENOUS | Status: DC | PRN
  Administered 2021-04-09 (×2): 40 ug via INTRAVENOUS
  Administered 2021-04-09: 160 ug via INTRAVENOUS
  Administered 2021-04-09 (×2): 40 ug via INTRAVENOUS

## 2021-04-09 MED ORDER — PHENOL 1.4 % MT LIQD
1.0000 | OROMUCOSAL | Status: DC | PRN
  Filled 2021-04-09: qty 177

## 2021-04-09 MED ORDER — VANCOMYCIN HCL 1000 MG IV SOLR
INTRAVENOUS | Status: AC
  Filled 2021-04-09: qty 20

## 2021-04-09 MED ORDER — BUPIVACAINE HCL (PF) 0.5 % IJ SOLN
INTRAMUSCULAR | Status: DC | PRN
  Administered 2021-04-09: 10 mL via PERINEURAL

## 2021-04-09 MED ORDER — ACETAMINOPHEN 10 MG/ML IV SOLN: 1000.0000 mg | Freq: Once | INTRAVENOUS | Status: DC | PRN

## 2021-04-09 MED ORDER — MUPIROCIN 2 % EX OINT
1.0000 "application " | TOPICAL_OINTMENT | Freq: Two times a day (BID) | CUTANEOUS | Status: DC
  Filled 2021-04-09: qty 22

## 2021-04-09 MED ORDER — OXYCODONE HCL 5 MG PO TABS
2.5000 mg | ORAL_TABLET | ORAL | Status: DC | PRN
  Administered 2021-04-10: 5 mg via ORAL
  Administered 2021-04-10: 2.5 mg via ORAL
  Filled 2021-04-09 (×3): qty 1

## 2021-04-09 MED ORDER — DEXAMETHASONE SODIUM PHOSPHATE 10 MG/ML IJ SOLN
INTRAMUSCULAR | Status: AC
  Filled 2021-04-09: qty 1

## 2021-04-09 MED ORDER — ACETAMINOPHEN 500 MG PO TABS
1000.0000 mg | ORAL_TABLET | Freq: Three times a day (TID) | ORAL | Status: DC
  Administered 2021-04-09 – 2021-04-10 (×3): 1000 mg via ORAL
  Filled 2021-04-09 (×3): qty 2

## 2021-04-09 MED ORDER — CHLORHEXIDINE GLUCONATE 0.12 % MT SOLN: 15.0000 mL | Freq: Once | OROMUCOSAL | Status: AC

## 2021-04-09 MED ORDER — GLYCOPYRROLATE 0.2 MG/ML IJ SOLN
INTRAMUSCULAR | Status: AC
  Filled 2021-04-09: qty 1

## 2021-04-09 MED ORDER — LIDOCAINE HCL (PF) 2 % IJ SOLN
INTRAMUSCULAR | Status: AC
  Filled 2021-04-09: qty 5

## 2021-04-09 MED ORDER — CHLORHEXIDINE GLUCONATE 0.12 % MT SOLN
OROMUCOSAL | Status: AC
  Administered 2021-04-09: 15 mL via OROMUCOSAL
  Filled 2021-04-09: qty 15

## 2021-04-09 MED ORDER — FENTANYL CITRATE (PF) 100 MCG/2ML IJ SOLN
INTRAMUSCULAR | Status: DC | PRN
  Administered 2021-04-09 (×2): 50 ug via INTRAVENOUS

## 2021-04-09 MED ORDER — BUPIVACAINE LIPOSOME 1.3 % IJ SUSP
INTRAMUSCULAR | Status: AC
  Filled 2021-04-09: qty 10

## 2021-04-09 MED ORDER — ROCURONIUM BROMIDE 10 MG/ML (PF) SYRINGE
PREFILLED_SYRINGE | INTRAVENOUS | Status: AC
  Filled 2021-04-09: qty 10

## 2021-04-09 MED ORDER — DEXAMETHASONE SODIUM PHOSPHATE 10 MG/ML IJ SOLN
INTRAMUSCULAR | Status: DC | PRN
  Administered 2021-04-09: 5 mg via INTRAVENOUS

## 2021-04-09 MED ORDER — OXYCODONE HCL 5 MG PO TABS: 5.0000 mg | ORAL_TABLET | Freq: Once | ORAL | Status: DC | PRN

## 2021-04-09 MED ORDER — PHENYLEPHRINE 40 MCG/ML (10ML) SYRINGE FOR IV PUSH (FOR BLOOD PRESSURE SUPPORT)
PREFILLED_SYRINGE | INTRAVENOUS | Status: AC
  Filled 2021-04-09: qty 10

## 2021-04-09 MED ORDER — BUPIVACAINE-EPINEPHRINE (PF) 0.5% -1:200000 IJ SOLN
INTRAMUSCULAR | Status: AC
  Filled 2021-04-09: qty 30

## 2021-04-09 MED ORDER — ONDANSETRON HCL 4 MG/2ML IJ SOLN: 4.0000 mg | Freq: Four times a day (QID) | INTRAMUSCULAR | Status: DC | PRN

## 2021-04-09 MED ORDER — MENTHOL 3 MG MT LOZG
1.0000 | LOZENGE | OROMUCOSAL | Status: DC | PRN
  Filled 2021-04-09: qty 9

## 2021-04-09 MED ORDER — BISACODYL 10 MG RE SUPP: 10.0000 mg | Freq: Every day | RECTAL | Status: DC | PRN

## 2021-04-09 MED ORDER — SUCCINYLCHOLINE CHLORIDE 200 MG/10ML IV SOSY
PREFILLED_SYRINGE | INTRAVENOUS | Status: DC | PRN
  Administered 2021-04-09: 80 mg via INTRAVENOUS

## 2021-04-09 SURGICAL SUPPLY — 86 items
ADH SKN CLS APL DERMABOND .7 (GAUZE/BANDAGES/DRESSINGS) ×1
ANCH SUT .5 CRC TPR CT 40X40 (SUTURE) ×1
ANCH SUT 1.4 SUT TPE BLK/WHT (SUTURE) ×1
APL PRP STRL LF DISP 70% ISPRP (MISCELLANEOUS) ×2
BASEPLATE GLENOSPHERE 25 STD (Miscellaneous) ×1 IMPLANT
BIT DRILL 3.2 PERIPHERAL SCREW (BIT) ×1 IMPLANT
BLADE SAGITTAL WIDE XTHICK NO (BLADE) ×2 IMPLANT
BNDG ADH 1X3 SHEER STRL LF (GAUZE/BANDAGES/DRESSINGS) ×6 IMPLANT
BNDG ADH THN 3X1 STRL LF (GAUZE/BANDAGES/DRESSINGS) ×6
BODY PROXIMAL PTC 11 132.5D (Spacer) IMPLANT
BSPLAT GLND STD 25 RVRS SHLDR (Miscellaneous) ×1 IMPLANT
CAP LOCKING COCR (Cap) ×1 IMPLANT
CHLORAPREP W/TINT 26 (MISCELLANEOUS) ×3 IMPLANT
CNTNR SPEC 2.5X3XGRAD LEK (MISCELLANEOUS) ×1
CONT SPEC 4OZ STER OR WHT (MISCELLANEOUS) ×1
CONT SPEC 4OZ STRL OR WHT (MISCELLANEOUS) ×1
CONTAINER SPEC 2.5X3XGRAD LEK (MISCELLANEOUS) ×1 IMPLANT
COOLER POLAR GLACIER W/PUMP (MISCELLANEOUS) ×2 IMPLANT
COVER BACK TABLE REUSABLE LG (DRAPES) ×2 IMPLANT
DERMABOND ADVANCED (GAUZE/BANDAGES/DRESSINGS) ×1
DERMABOND ADVANCED .7 DNX12 (GAUZE/BANDAGES/DRESSINGS) ×1 IMPLANT
DRAPE 3/4 80X56 (DRAPES) ×4 IMPLANT
DRAPE INCISE IOBAN 66X45 STRL (DRAPES) ×4 IMPLANT
DRAPE U-SHAPE 47X51 STRL (DRAPES) ×3 IMPLANT
DRSG OPSITE POSTOP 4X8 (GAUZE/BANDAGES/DRESSINGS) ×2 IMPLANT
DRSG TEGADERM 4X4.75 (GAUZE/BANDAGES/DRESSINGS) ×1 IMPLANT
ELECT REM PT RETURN 9FT ADLT (ELECTROSURGICAL) ×2
ELECTRODE REM PT RTRN 9FT ADLT (ELECTROSURGICAL) ×1 IMPLANT
GAUZE SPONGE 4X4 12PLY STRL (GAUZE/BANDAGES/DRESSINGS) ×1 IMPLANT
GAUZE XEROFORM 1X8 LF (GAUZE/BANDAGES/DRESSINGS) ×2 IMPLANT
GLENOSPHERE CANN 36M ECCENT +2 (Joint) ×1 IMPLANT
GLOVE SRG 8 PF TXTR STRL LF DI (GLOVE) ×2 IMPLANT
GLOVE SURG GAMMEX PI TX LF 7.5 (GLOVE) ×4 IMPLANT
GLOVE SURG ORTHO LTX SZ8 (GLOVE) ×4 IMPLANT
GLOVE SURG SYN 8.0 (GLOVE) ×2 IMPLANT
GLOVE SURG SYN 8.0 PF PI (GLOVE) ×1 IMPLANT
GLOVE SURG UNDER POLY LF SZ8 (GLOVE) ×4
GOWN STRL REUS W/ TWL LRG LVL3 (GOWN DISPOSABLE) ×2 IMPLANT
GOWN STRL REUS W/ TWL XL LVL3 (GOWN DISPOSABLE) ×1 IMPLANT
GOWN STRL REUS W/TWL LRG LVL3 (GOWN DISPOSABLE) ×4
GOWN STRL REUS W/TWL XL LVL3 (GOWN DISPOSABLE) ×2
GUIDEWIRE GLENOID 2.5X220 (WIRE) ×1 IMPLANT
HEMOVAC 400CC 10FR (MISCELLANEOUS) ×2 IMPLANT
HOOD PEEL AWAY FLYTE STAYCOOL (MISCELLANEOUS) ×6 IMPLANT
IMPL REVERSE SHOULDER 0X3.5 (Shoulder) IMPLANT
IMPLANT REVERSE SHOULDER 0X3.5 (Shoulder) ×2 IMPLANT
INSERT HUMERAL 36X6MM 12.5DEG (Insert) ×1 IMPLANT
IV NS IRRIG 3000ML ARTHROMATIC (IV SOLUTION) ×1 IMPLANT
KIT STABILIZATION SHOULDER (MISCELLANEOUS) ×2 IMPLANT
MANIFOLD NEPTUNE II (INSTRUMENTS) ×2 IMPLANT
MASK FACE SPIDER DISP (MASK) ×2 IMPLANT
MAT ABSORB  FLUID 56X50 GRAY (MISCELLANEOUS) ×2
MAT ABSORB FLUID 56X50 GRAY (MISCELLANEOUS) ×2 IMPLANT
NDL REVERSE CUT 1/2 CRC (NEEDLE) ×1 IMPLANT
NEEDLE REVERSE CUT 1/2 CRC (NEEDLE) ×2 IMPLANT
NS IRRIG 1000ML POUR BTL (IV SOLUTION) ×2 IMPLANT
PACK ARTHROSCOPY SHOULDER (MISCELLANEOUS) ×2 IMPLANT
PAD WRAPON POLAR SHDR XLG (MISCELLANEOUS) ×1 IMPLANT
PROXIMAL BODY PTC 11 132.5D (Spacer) ×2 IMPLANT
PULSAVAC PLUS IRRIG FAN TIP (DISPOSABLE) ×2
RETRIEVER SUT HEWSON (MISCELLANEOUS) ×1 IMPLANT
SCREW 5.0X18 (Screw) ×1 IMPLANT
SCREW 5.0X38 SMALL F/PERFORM (Screw) ×1 IMPLANT
SCREW 5.5X26 (Screw) ×1 IMPLANT
SCREW BONE 6.5 OD 30 NON BIO (Screw) ×1 IMPLANT
SCREW PERIPHERAL 5.0X34 (Screw) ×1 IMPLANT
SCREW REV AEQUALIS 20 (Screw) ×1 IMPLANT
SCREW SHLD ASSEMBLY AEQ 20 (Spacer) ×1 IMPLANT
SPONGE T-LAP 18X18 ~~LOC~~+RFID (SPONGE) ×8 IMPLANT
STEM PRTL DISTAL 11 SHOULDER (Miscellaneous) ×1 IMPLANT
STRAP SAFETY 5IN WIDE (MISCELLANEOUS) ×4 IMPLANT
SUT ETHIBOND #5 BRAIDED 30INL (SUTURE) ×1 IMPLANT
SUT FIBERWIRE #2 38 BLUE 1/2 (SUTURE) ×6
SUT MNCRL AB 4-0 PS2 18 (SUTURE) ×1 IMPLANT
SUT TICRON 2-0 30IN 311381 (SUTURE) ×5 IMPLANT
SUT VIC AB 0 CT1 36 (SUTURE) ×2 IMPLANT
SUT VIC AB 2-0 CT2 27 (SUTURE) ×4 IMPLANT
SUT XBRAID 1.4 BLK/WHT (SUTURE) ×1 IMPLANT
SUT XBRAID 1.4 BLUE (SUTURE) ×1 IMPLANT
SUT XBRAID 1.4 WHITE/BLUE (SUTURE) ×1 IMPLANT
SUTURE FIBERWR #2 38 BLUE 1/2 (SUTURE) ×4 IMPLANT
SYR 20ML LL LF (SYRINGE) ×2 IMPLANT
TIP FAN IRRIG PULSAVAC PLUS (DISPOSABLE) ×1 IMPLANT
TRAY FOLEY SLVR 16FR LF STAT (SET/KITS/TRAYS/PACK) ×1 IMPLANT
WATER STERILE IRR 1000ML POUR (IV SOLUTION) ×1 IMPLANT
WRAPON POLAR PAD SHDR XLG (MISCELLANEOUS) ×2

## 2021-04-09 NOTE — Anesthesia Postprocedure Evaluation (Signed)
Anesthesia Post Note ? ?Patient: Alejandra Wiley ? ?Procedure(s) Performed: Right reverse shoulder arthroplasty for fracture (Right: Shoulder) ?BICEPS TENODESIS (Right: Shoulder) ? ?Patient location during evaluation: PACU ?Anesthesia Type: General ?Level of consciousness: awake and alert ?Pain management: pain level controlled ?Vital Signs Assessment: post-procedure vital signs reviewed and stable ?Respiratory status: spontaneous breathing, nonlabored ventilation, respiratory function stable and patient connected to nasal cannula oxygen ?Cardiovascular status: blood pressure returned to baseline and stable ?Postop Assessment: no apparent nausea or vomiting ?Anesthetic complications: no ? ? ?No notable events documented. ? ? ?Last Vitals:  ?Vitals:  ? 04/09/21 1140 04/09/21 1145  ?BP: (!) 113/59   ?Pulse: 70 70  ?Resp: 16 12  ?Temp:    ?SpO2: 94% 94%  ?  ?Last Pain:  ?Vitals:  ? 04/09/21 1119  ?TempSrc:   ?PainSc: 0-No pain  ? ? ?  ?  ?  ?  ?  ?  ? ?Arita Miss ? ? ? ? ?

## 2021-04-09 NOTE — Anesthesia Procedure Notes (Signed)
Procedure Name: Intubation ?Date/Time: 04/09/2021 7:52 AM ?Performed by: Cammie Sickle, CRNA ?Pre-anesthesia Checklist: Patient identified, Patient being monitored, Timeout performed, Emergency Drugs available and Suction available ?Patient Re-evaluated:Patient Re-evaluated prior to induction ?Oxygen Delivery Method: Circle system utilized ?Preoxygenation: Pre-oxygenation with 100% oxygen ?Induction Type: IV induction ?Ventilation: Mask ventilation without difficulty ?Laryngoscope Size: 3 and McGraph ?Grade View: Grade I ?Tube type: Oral ?Tube size: 6.5 mm ?Number of attempts: 1 ?Airway Equipment and Method: Stylet ?Placement Confirmation: ETT inserted through vocal cords under direct vision, positive ETCO2 and breath sounds checked- equal and bilateral ?Secured at: 22 cm ?Tube secured with: Tape ?Dental Injury: Teeth and Oropharynx as per pre-operative assessment  ? ? ? ? ?

## 2021-04-09 NOTE — Op Note (Signed)
Operative Note  ?  ?SURGERY DATE: 04/09/2021 ?  ?PRE-OP DIAGNOSIS:  ?1. Right 3-part proximal humerus fracture ?  ?POST-OP DIAGNOSIS:  ?1. Right 3-part proximal humerus fracture ?  ?PROCEDURES:  ?1. Right reverse total shoulder arthroplasty ?2. Right biceps tenodesis ?  ?SURGEON: Cato Mulligan, MD ? ?ASSISTANTS: Reche Dixon, PA ?  ?ANESTHESIA: Gen + interscalene block ?  ?ESTIMATED BLOOD LOSS: 200cc ?  ?TOTAL IV FLUIDS: see anesthesia record ? ?IMPLANTS: ?Tornier Perform 19m Glenoid Baseplate with 4 associated glenoid baseplate screws; Eccentric + 246mglenosphere; Aequalis Flex Revive 1152mres Fit stem with 47m40moximal body with 20mm22mcer, High (3.5mm) 40mentric Reversed Tray; Reversed Insert +6mm ? 26mDICATION(S):  Alejandra Wiley who sustained a significantly displaced 3-part proximal humerus fracture. After discussion of risks, benefits, and alternatives to surgery, the patient elected to proceed with reverse shoulder arthroplasty and biceps tenodesis. ?  ?OPERATIVE FINDINGS: 3-part proximal humerus fracture, biceps tendinopathy ?  ?OPERATIVE REPORT:   ?I identified Alejandra Wiley LABO pre-operative holding area. Informed consent was obtained and the surgical site was marked. I reviewed the risks and benefits of the proposed surgical intervention and the patient (and/or patient's guardian) wished to proceed. An interscalene block with Exparel was administered by the Anesthesia team. The patient was transferred to the operative suite and general anesthesia was administered. The patient was placed in the beach chair position with the head of the bed elevated approximately 30 degrees. All down side pressure points were appropriately padded. Appropriate IV antibiotics were administered. Tranexamic acid was also administered after verifying that the patient had no contraindications. The extremity was then prepped and draped in standard fashion. A time out was performed confirming the  correct extremity, correct patient, and correct procedure.  ? ?We used the standard deltopectoral incision from the coracoid to ~10cm distal.  Vancomycin powder was placed in the dermis.  We found the cephalic vein and took it laterally. We opened the deltopectoral interval widely and placed retractors under the CA ligament in the subacromial space and under the deltoid tendon at its insertion. We then abducted and internally rotated the arm and released the underlying bursa between these retractors, taking care not to damage the circumflex branch of the axillary nerve.  ? ?Next, we brought the arm back in adduction at slight forward flexion with external rotation. We opened the clavipectoral fascia lateral to the conjoint tendon.  The "3 sisters" were identified and coagulated.  The arm was then internally rotated, we cut the falciform ligament at approximately 1 cm of the upper portion of the pectoralis major insertion. Next we unroofed the bicipital groove. Biceps tendon was significantly erythematous and inflamed. We proceeded with a soft tissue biceps tenodesis given the pathology of the tendon.  After opening the biceps tendon sheath all the way to the supraglenoid tubercle, we performed a biceps tenodesis with two #2 TiCron sutures to the upper border of the pectoralis major. The proximal portion of the tendon was excised.  ? ?FiberWire sutures were placed within the supraspinatus, infraspinatus, and subscapularis to obtain appropriate control of the tuberosities.  The lesser tuberosity was not able to be easily separated from the articular portion of the humeral head.  An osteotomy was created with a straight osteotome at the level of the bicipital groove to separate the lesser tuberosity from the articular surface of the humeral head.  The articular portion was removed. The main humeral head fragment was then identified and  removed. Cancellous bone was removed from the removed humeral head to be used later  as bone graft.  Four #5 Ethibond sutures were placed around the greater tuberosity at the bone-tendon junction.  Additionally, a 29m FiberTape and X-Braid tape suture were placed around the greater tuberosity ? ?Next, A posterior retractor was used to retract greater tuberosity and the humeral shaft, exposing the glenoid.  The anterior capsule was separated from the supraspinatus and excised from the lesser tuberosity to the glenoid.  The labrum and the remnant of the biceps tendon were removed in a circumferential fashion.  The attachment of the triceps muscle was released off of the inferior glenoid.  During the glenoid exposure, the axillary nerve was protected the entire time.  Appropriate retractors were placed and there was excellent glenoid visualization. ? ?The central guidepin was drilled, correcting with mild retroversion. The glenoid was reamed in order to remove the cartilaginous surface without taking away too much bone.  Standard glenoid instrumentation was performed.  The baseplate was placed and achieved excellent press-fit such that on attempted further tightening, the entire scapula rotated.  The peripheral screws were drilled, measured, and placed.  Peripheral reamer was used to clear out any debris and allow for appropriate glenosphere placement.  Glenosphere was impacted and found to be firmly seated.  Glenosphere screw was tightened appropriately. ?  ?We then turned our attention to the humerus.  The humeral canal was sounded and sized appropriately until excellent diaphyseal fit was achieved.  Broaching was performed at ~20 degrees of retroversion.  Trial implants were placed.  The joint was reduced and noted to have satisfactory stability, motion, and deltoid tension with adequate tuberosity reduction. ? ?We drilled two holes into the shaft on either side of the bicipital groove ~2cm inferior to the humeral fracture line and passed an Xbraid tape in through each hole to serve as vertical  tuberosity fixation sutures.  The previously passed FiberTape and Xbraid in the greater tuberosity were passed through the medial and lateral suture holes in the stem.  The stem was then inserted into the appropriate position with bone graft placed in the canal prior to final seating.  Excellent press-fit was achieved.  Reverse tray and poly insert were placed. ? ?Next, #5 Ethibond sutures from the greater tuberosity were passed around the lesser tuberosity to serve as horizontal cerclage sutures.  FiberTape suture was then passed around the lesser tuberosity as well to serve as an around the world suture (greater tuberosity to stem to the lesser tuberosity).  Greater tuberosity to lateral stem suture was then passed through the drill hole on the lateral aspect of the bicipital groove.  Vertical sutures were passed through the greater tuberosity and lesser tuberosity appropriately.  The arm was placed in slight abduction and external rotation.  Bone graft was placed between the tuberosities and the implant to help stimulate bone healing.  The greater tuberosity to implant XBraid suture was tied first, securing the greater tuberosity to the implant.  Next, the horizontal cerclage Ethibond sutures were tied, and this was followed by the around the world FiberTape suture, finally vertical sutures were tied. This achieved excellent stability of the tuberosities and they were stable to external and internal rotation.  ?  ?Final confirmation of motion, tension, and stability were satisfactory. The wound was thoroughly irrigated. Vancomycin powder was placed.  A Hemovac drain was placed.  ?  ?The deltopectoral interval was closed with a running, 0-Vicryl suture. The skin was closed with  2-0 Vicryl, 4-0 Monocryl, and Dermabond. Xeroform and Honeycomb dressing were applied. A PolarCare unit and sling were placed. Patient was extubated, transferred to a stretcher bed and to the post anesthesia care unit in stable condition.   ?  ?Of note, assistance from a PA was essential to performing the surgery.  PA was present for the entire surgery.  PA assisted with patient positioning, retraction, instrumentation, and wound closure. The surg

## 2021-04-09 NOTE — Transfer of Care (Signed)
Immediate Anesthesia Transfer of Care Note ? ?Patient: Alejandra Wiley ? ?Procedure(s) Performed: Right reverse shoulder arthroplasty for fracture (Right: Shoulder) ?BICEPS TENODESIS (Right: Shoulder) ? ?Patient Location: PACU ? ?Anesthesia Type:General ? ?Level of Consciousness: awake ? ?Airway & Oxygen Therapy: Patient Spontanous Breathing ? ?Post-op Assessment: Report given to RN and Post -op Vital signs reviewed and stable ? ?Post vital signs: Reviewed and stable ? ?Last Vitals:  ?Vitals Value Taken Time  ?BP 134/57 04/09/21 1120  ?Temp 35.9   ?Pulse 70 04/09/21 1125  ?Resp 16 04/09/21 1125  ?SpO2 97 % 04/09/21 1125  ?Vitals shown include unvalidated device data. ? ?Last Pain:  ?Vitals:  ? 04/09/21 0631  ?TempSrc: Tympanic  ?PainSc: 5   ?   ? ?Patients Stated Pain Goal: 0 (04/09/21 0631) ? ?Complications: No notable events documented. ?

## 2021-04-09 NOTE — Evaluation (Signed)
Physical Therapy Evaluation ?Patient Details ?Name: Alejandra Wiley ?MRN: [id number] ?DOB: [birth year] ?Today's Date: 04/09/2021 ? ?History of Present Illness ? Pt is an 88 y.o. female s/p R reverse total shoulder arthroplasty and R biceps tenodesis (d/t R 3-part proximal humerus fx) with interscalene brachial plexus block 04/09/21.  PMH includes htn, CHF, pacemaker, anxiety, CVA, a-fib, bradycardia, SSS, hernia repair, R TKR with revision.  ?Clinical Impression ? Prior to fall about 2 weeks ago, pt was independent with functional mobility and driving; has required hand hold assist of her husband to walk since fall; lives at [facility].  Currently pt is mod assist with bed mobility; min assist with transfers using L hand hold assist; and min assist with ambulation (2nd assist for equipment) utilizing L hand hold assist x40 feet.  Pt appearing shaky/unsteady with ambulation requiring assist for balance.  Pt requesting back to bed end of session d/t fatigue (pt reports being up since 3:30 this morning)--pt's bed linens changed prior to getting back into bed d/t linen noted to be wet.  Pt reporting being comfortable in bed end of session with all needs in reach.  Pt would benefit from skilled PT to address noted impairments and functional limitations (see below for any additional details).  Upon hospital discharge, anticipate pt may benefit from SNF.   ? ?Recommendations for follow up therapy are one component of a multi-disciplinary discharge planning process, led by the attending physician.  Recommendations may be updated based on patient status, additional functional criteria and insurance authorization. ? ?Follow Up Recommendations Skilled nursing-short term rehab (<3 hours/day) ? ?  ?Assistance Recommended at Discharge Frequent or constant Supervision/Assistance  ?Patient can return home with the following ? A little help with walking and/or transfers;A little help with  bathing/dressing/bathroom;Assistance with cooking/housework;Assist for transportation ? ?  ?Equipment Recommendations Kasandra Knudsen  ?Recommendations for Other Services ? OT consult  ?  ?Functional Status Assessment Patient has had a recent decline in their functional status and demonstrates the ability to make significant improvements in function in a reasonable and predictable amount of time.  ? ?  ?Precautions / Restrictions Precautions ?Precautions: Fall ?Precaution Comments: R UE sling with abduction pillow may be off for dressing/bathing/exercises ?Required Braces or Orthoses: Sling ?Restrictions ?Weight Bearing Restrictions: Yes ?RUE Weight Bearing: Non weight bearing ?Other Position/Activity Restrictions: Per orders: No PROM of shoulder.  Please instruct on pendulums.  Also work on hand, wrist, and elbow ROM.  ? ?  ? ?Mobility ? Bed Mobility ?Overal bed mobility: Needs Assistance ?Bed Mobility: Supine to Sit, Sit to Supine ?  ?  ?Supine to sit: Mod assist, HOB elevated (assist for trunk) ?Sit to supine: Mod assist (assist for trunk and B LE's) ?  ?General bed mobility comments: vc's for technique; increased time to perform ?  ? ?Transfers ?Overall transfer level: Needs assistance ?Equipment used: 1 person hand held assist ?Transfers: Sit to/from Stand, Bed to chair/wheelchair/BSC ?Sit to Stand: Min assist ?  ?Step pivot transfers: Min assist ?  ?  ?  ?General transfer comment: x1 trial standing from bed and x1 trial standing from recliner; stand step turn recliner to bed; assist to steady ?  ? ?Ambulation/Gait ?Ambulation/Gait assistance: Min assist, +2 safety/equipment ?Gait Distance (Feet): 40 Feet ?Assistive device: 1 person hand held assist ?  ?Gait velocity: decreased ?  ?  ?General Gait Details: pt shaky in general; decreased B LE step length/foot clearance/heelstrike; narrow BOS; assist to steady ? ?Stairs ?  ?  ?  ?  ?  ? ?  Wheelchair Mobility ?  ? ?Modified Rankin (Stroke Patients Only) ?  ? ?  ? ?Balance  Overall balance assessment: Needs assistance ?Sitting-balance support: No upper extremity supported, Feet supported ?Sitting balance-Leahy Scale: Good ?Sitting balance - Comments: steady sitting reaching within BOS with L UE ?  ?Standing balance support: Single extremity supported, During functional activity ?Standing balance-Leahy Scale: Poor ?Standing balance comment: pt requiring assist for balance during ambulation ?  ?  ?  ?  ?  ?  ?  ?  ?  ?  ?  ?   ? ? ? ?Pertinent Vitals/Pain Pain Assessment ?Pain Assessment: Faces ?Faces Pain Scale: No hurt ?Pain Location: R UE ?Pain Intervention(s): Limited activity within patient's tolerance, Monitored during session, Repositioned ?HR and O2 on room air stable and WFL throughout treatment session.  ? ? ?Home Living Family/patient expects to be discharged to:: Private residence ?Living Arrangements: Spouse/significant other ?Available Help at Discharge: Family;Available PRN/intermittently ?Type of Home: House ?Home Access: Level entry ?  ?  ?  ?Home Layout: Two level;Able to live on main level with bedroom/bathroom ?  ?Additional Comments: Normally independent and driving  ?  ?Prior Function Prior Level of Function : Independent/Modified Independent ?  ?  ?  ?  ?  ?  ?Mobility Comments: Takes yoga.  Last couple weeks (since fall) pt has required hand hold assist of her husband to walk ?ADLs Comments: Normally independent but requiring assist last couple weeks since fall. ?  ? ? ?Hand Dominance  ?   ? ?  ?Extremity/Trunk Assessment  ? Upper Extremity Assessment ?Upper Extremity Assessment: RUE deficits/detail ?RUE Deficits / Details: Pt able to flex 3rd-5th digits of R hand but impaired flexion noted of pt's R thumb and index finger (nurse present and messaged MD); decreased R hand sensation also noted compared to L hand.  Pt did have interscalene brachial plexus block performed. ?RUE: Unable to fully assess due to immobilization ?RUE Sensation: decreased light touch (s/p  interscalene brachial plexus block) ?  ? ?Lower Extremity Assessment ?Lower Extremity Assessment: Generalized weakness ?  ? ?Cervical / Trunk Assessment ?Cervical / Trunk Assessment: Normal  ?Communication  ? Communication: No difficulties  ?Cognition Arousal/Alertness: Awake/alert ?Behavior During Therapy: Anxious ?Overall Cognitive Status: Within Functional Limits for tasks assessed ?  ?  ?  ?  ?  ?  ?  ?  ?  ?  ?  ?  ?  ?  ?  ?  ?General Comments: Pt requiring extra time for activities ?  ?  ? ?  ?General Comments General comments (skin integrity, edema, etc.): R UE in shoulder sling with abduction pillow.  Nursing cleared pt for participation in physical therapy.  Pt agreeable to PT session.  Pt's husband present briefly during session. ? ?  ?Exercises    ? ?Assessment/Plan  ?  ?PT Assessment Patient needs continued PT services  ?PT Problem List Decreased strength;Decreased range of motion;Decreased activity tolerance;Decreased balance;Decreased mobility;Decreased knowledge of use of DME;Decreased safety awareness;Decreased knowledge of precautions;Pain;Decreased skin integrity ? ?   ?  ?PT Treatment Interventions DME instruction;Gait training;Functional mobility training;Therapeutic activities;Therapeutic exercise;Balance training;Patient/family education   ? ?PT Goals (Current goals can be found in the Care Plan section)  ?Acute Rehab PT Goals ?Patient Stated Goal: to improve mobility and independence ?PT Goal Formulation: With patient ?Time For Goal Achievement: 04/23/21 ?Potential to Achieve Goals: Good ? ?  ?Frequency BID ?  ? ? ?Co-evaluation   ?  ?  ?  ?  ? ? ?  ?  AM-PAC PT "6 Clicks" Mobility  ?Outcome Measure Help needed turning from your back to your side while in a flat bed without using bedrails?: A Little ?Help needed moving from lying on your back to sitting on the side of a flat bed without using bedrails?: A Lot ?Help needed moving to and from a bed to a chair (including a wheelchair)?: A  Little ?Help needed standing up from a chair using your arms (e.g., wheelchair or bedside chair)?: A Little ?Help needed to walk in hospital room?: A Little ?Help needed climbing 3-5 steps with a railing? : A Lot ?6 Click Score: 16 ? ?  ?En

## 2021-04-09 NOTE — Discharge Instructions (Signed)
Alejandra Mulligan, MD ? [facility] ? Phone: [phone] ? Fax: [phone] ? ? ?Discharge Instructions after Reverse Shoulder Replacement ? ?  ?1. Activity/Sling: You are to be non-weight bearing on operative extremity. A sling/shoulder immobilizer has been provided for you. Only remove the sling to perform elbow, wrist, and hand RoM exercises and hygiene/dressing. Active reaching and lifting are not permitted. You will be given further instructions on sling use at your first physical therapy visit and postoperative visit with Dr. Posey Pronto.  ? ?2. Dressings: Dressing may be removed 5 days after surgery. Afterwards, you may either leave open to air (if no drainage) or cover with dry, sterile dressing. If you have steri-strips on your wound, please do not remove them. They will fall off on their own. You may shower 5 days after surgery. Please pat incision dry. Do not rub or place any shear forces across incision. If there is drainage or any opening of incision after 5 days, please notify our offices immediately.  ?  ?3. Driving:  Plan on not driving for six weeks. Please note that you are advised NOT to drive while taking narcotic pain medications as you may be impaired and unsafe to drive. ?  ?4. Medications:  ?- You have been provided a prescription for narcotic pain medicine (usually oxycodone). After surgery, take 1-2 narcotic tablets every 4 hours if needed for severe pain. Please start this as soon as you begin to start having pain (if you received a nerve block, start taking as soon as this wears off).  ?- A prescription for anti-nausea medication will be provided in case the narcotic medicine causes nausea - take 1 tablet every 6 hours only if nauseated.  ?- Resume home Eliquis the day after surgery. ?-Take tylenol '1000mg'$  (2 Extra strength or 3 regular strength tablets) every 8 hours for pain. This will reduce the amount of narcotic medication needed. May stop tylenol when you are having minimal pain. ?-  Take a stool softener (Colace, Dulcolax or Senakot) if you are using narcotic pain medications to help with constipation that is associated with narcotic use. ?- DO NOT take ANY nonsteroidal anti-inflammatory pain medications: ?Advil, Motrin, Ibuprofen, Aleve, Naproxen, or Naprosyn.  ? ?If you are taking prescription medication for anxiety, depression, insomnia, muscle spasm, chronic pain, or for attention deficit disorder you are advised that you are at a higher risk of adverse effects with use of narcotics post-op, including narcotic addiction/dependence, depressed breathing, death. ?If you use non-prescribed substances: alcohol, marijuana, cocaine, heroin, methamphetamines, etc., you are at a higher risk of adverse effects with use of narcotics post-op, including narcotic addiction/dependence, depressed breathing, death. ?You are advised that taking > 50 morphine milligram equivalents (MME) of narcotic pain medication per day results in twice the risk of overdose or death. For your prescription provided: oxycodone 5 mg - taking more than 6 tablets per day after the first few days of surgery. ?  ?5. Physical Therapy: Plan for physical therapy at home. This can start ~2 weeks after surgery. The therapist will provide home exercises. Please contact our offices if this appointment has not been scheduled.  ?  ?6. Work: May do light duty/desk job in approximately 2 weeks when off of narcotics, pain is well-controlled, and swelling has decreased if able to function with one arm in sling. Full work may take 6 weeks if light motions and function of both arms is required. Lifting jobs may require 12 weeks. ?  ?7. Post-Op Appointments: ?Your first  post-op appointment will be with Dr. Posey Pronto in approximately 2 weeks time.  ?  ?If you find that they have not been scheduled please call the Orthopaedic Appointment front desk at [phone]. ? ? ? ? ? ? ? ? ? ? ? ? ? ? ? ? ? ? ? ? ? ? ? ? ? ? ? ? ? ? ?Alejandra Mulligan,  MD ?[facility] ?Phone: [phone] ?Fax: [phone] ? ? ?REVERSE SHOULDER ARTHROPLASTY REHAB GUIDELINES ? ? ?These guidelines should be tailored to individual patients based on their rehab goals, age, precautions, quality of repair, etc.  Progression should be based on patient progress and approval by the referring physician. ? ?PHASE 1 - Day 1 through Week 2 ? ?GENERAL GUIDELINES AND PRECAUTIONS ?Sling wear 24/7 except during grooming and home exercises (3 to 5 times daily) ?Avoid shoulder extension such that the arm is posterior the frontal plane.  When patients recline, a pillow should be placed behind the upper arm and sling should be on.  They should be advised to always be able to see the elbow ?Avoid combined IR/ADD/EXT, such as hand behind back to prevent dislocation ?Avoid combined IR and ADD such as reaching across the chest to prevent dislocation ?No AROM ?No submersion in pool/water for 4 weeks ?No weight bearing through operative arm (as in transfers, walker use, etc?) ? ?GOALS ?Maintain integrity of joint replacement; protect soft tissue healing ?Increase PROM for elevation to 120 and ER to 30 (will remain the goal for first 6 weeks) ?Optimize distal UE circulation and muscle activity (elbow, wrist and hand) ?Instruct in use of sling for proper fit, polar care device for ice application after HEP, signs/symptoms of infection ? ?EXERCISES ?Active elbow, wrist and hand ?Passive forward elevation in scapular plane to 90-120 max motion; ER in scapular plane to 30 ?Active scapular retraction with arms resting in neutral position ? ?CRITERIA TO PROGRESS TO PHASE 2 ?Low pain (less than 3/10) with shoulder PROM ?Healing of incision without signs of infection ?Clearance by MD to advance after 2 week MD check up ? ?PHASE 2 - 2 weeks - 6 weeks ? ?GENERAL GUIDELINES AND PRECAUTIONS ?Sling may be removed while at home; worn in community without abduction pillow ?May use arm for light activities of daily  living (such as feeding, brushing teeth, dressing?) with elbow near  the side of the body  and arm in front of the body- no active lifting of the arm ?May submerge in water (tub, pool, [place], etc?) after 4 weeks ?Continue to avoid WBing through the operative arm ?Continue to avoid combined IR/EXT/ADD (hand behind the back) and IR/ADD  (reaching across chest) for dislocation precautions ? ?GOALS ? Achieve passive elevation to 120 and ER to 30 ? Low (less than 3/10) to no pain ? Ability to fire all heads of the deltoid ? ?EXERCISES ?May discontinue grip, and active elbow and wrist exercises since using the arm in ADL?s  with sling removed around the home ?Continue passive elevation to 120 and ER to 30, both in scapular plane with arm supported on table top ?Add submaximal isometrics, pain free effort, for all functional heads of deltoid (anterior, posterior, middle)  Ensure that with posterior deltoid isometric the shoulder does not move into extension and the arm remains anterior the frontal plane ?At 4 weeks:  begin to place arm in balanced position of 90 deg elevation in supine; when patient able to hold this position with ease, may begin reverse pendulums clockwise  and counterclockwise ? ?CRITERIA TO PROGRESS TO PHASE 3 ?Passive forward elevation in scapular plane to 120; passive ER in scapular plane to 30 ?Ability to fire isometrically all heads of the deltoid muscle without pain ?Ability to place and hold the arm in balanced position (90 deg elevation in supine) ? ?PHASE 3 - 6 weeks to 3 months ? ?GENERAL GUIDELINES AND PRECAUTIONS ?Discontinue use of sling ?Avoid forcing end range motion in any direction to prevent dislocation  ?May advance use of the arm actively in ADL?s without being restricted to arm by the side of the body, however, avoid heavy lifting and sports (forever!) ?May initiate functional IR behind the back gently ?NO UPPER BODY ERGOMETER  ? ?GOALS ?Optimize PROM for elevation and ER in  scapular plane with realistic expectation that max  mobility for elevation is usually around 145-160 passively; ER 40 to 50 passively; functional IR to L1 ?Recover AROM to approach as close to PROM available as possible; may

## 2021-04-09 NOTE — Anesthesia Preprocedure Evaluation (Addendum)
Anesthesia Evaluation  ?Patient identified by MRN, date of birth, ID band ?Patient awake ? ?General Assessment Comment: ? ?Multiple ecchymoses in various stages of healing on face ? ?Reviewed: ?Allergy & Precautions, H&P , NPO status , Patient's Chart, lab work & pertinent test results, reviewed documented beta blocker date and time  ? ?History of Anesthesia Complications ?(+) history of anesthetic complications ? ?Airway ?Mallampati: III ? ?TM Distance: >3 FB ?Neck ROM: full ? ? ? Dental ? ?(+) Chipped ?  ?Pulmonary ?neg pulmonary ROS, neg sleep apnea, neg COPD, Patient abstained from smoking.Not current smoker,  ?  ?Pulmonary exam normal ?breath sounds clear to auscultation ? ? ? ? ? ? Cardiovascular ?Exercise Tolerance: Good ?METS: 3 - Mets hypertension, +CHF  ?(-) CAD and (-) Past MI Normal cardiovascular exam(-) dysrhythmias + pacemaker  ?Rhythm:Regular Rate:Normal ? ?Good exercise tolerance, does yoga normally ?2022 echo: ?1. Left ventricular ejection fraction, by estimation, is 45%. The left  ?ventricle has mildly decreased function. The left ventricle demonstrates  ?global hypokinesis. Left ventricular diastolic parameters are  ?with Grade II diastolic dysfunction  ?(pseudonormalization).  ??2. Right ventricular systolic function is normal. The right ventricular  ?size is normal.  ??3. The mitral valve is normal in structure. Mild mitral valve  ?regurgitation.  ??4. The aortic valve is tricuspid. Aortic valve regurgitation is not  ?visualized.  ??5. The inferior vena cava is normal in size with greater than 50%  ?respiratory variability, suggesting right atrial pressure of 3 mmHg.  ?  ?Neuro/Psych ?PSYCHIATRIC DISORDERS Anxiety Right foot weakness ?CVA, Residual Symptoms   ? GI/Hepatic ?neg GERD  ,(+)  ?  ? (-) substance abuse ? ,   ?Endo/Other  ?neg diabetes ? Renal/GU ?negative Renal ROS  ? ?  ?Musculoskeletal ? ? Abdominal ?  ?Peds ? Hematology ?  ?Anesthesia Other  Findings ?Past Medical History: ?No date: Arrhythmia ?    Comment:  A-Fib ?No date: Bradycardia ?No date: [facility]) ?    Comment:  vaginal intraepithelial neoplasia ?12/06/2003: Pacemaker ?    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 ?Past Surgical History: ?No date: APPENDECTOMY ?No date: HERNIA REPAIR ?    Comment:  lower abd ?2015: IMAGE GUIDED SINUS SURGERY ?12/06/2003: INSERT / REPLACE / REMOVE PACEMAKER ?    Comment:  Medtronic impulse DTDR01 ?No date: JOINT REPLACEMENT; Right ?    Comment:  knee ?1999: LEEP ?02/2016: ORIF HIP FRACTURE; Right ?09/18/2012: PACEMAKER GENERATOR CHANGE; N/A ?    Comment:  Procedure: PACEMAKER GENERATOR CHANGE;  Surgeon: Remo Lipps  ?             Peterson Lombard, MD;  Location: [facility] CATH LAB;  Service:  ?             Cardiovascular;  Laterality: N/A; ?No date: TONSILLECTOMY ?No date: TOTAL KNEE ARTHROPLASTY ?09/23/2016: TOTAL KNEE REVISION; Right ?    Comment:  Procedure: TOTAL KNEE REVISION-POLYETHYLENE EXCHANGE;   ?             Surgeon: Hessie Knows, MD;  Location: [facility] ORS;   ?             Service: Orthopedics;  Laterality: Right; ?No date: VAGINAL HYSTERECTOMY ? ?BMI   ? Body Mass Index:  20.09 kg/m?  ?  ? ? Reproductive/Obstetrics ? ?  ? ? ? ? ? ? ? ? ? ? ? ? ? ?  ?  ? ? ? ? ? ? ? ? ?  Anesthesia Physical ? ?Anesthesia Plan ? ?ASA: 3 ? ?Anesthesia Plan: General  ? ?Post-op Pain Management: Regional block* and Ofirmev IV (intra-op)*  ? ?Induction: Intravenous ? ?PONV Risk Score and Plan: 3 and Treatment may vary due to age or medical condition, Ondansetron, Dexamethasone and Midazolam ? ?Airway Management Planned: Oral ETT ? ?Additional Equipment: None ? ?Intra-op Plan:  ? ?Post-operative Plan: Extubation in OR ? ?Informed Consent: I have reviewed the patients History and Physical, chart, labs and discussed the procedure including the risks, benefits and  alternatives for the proposed anesthesia with the patient or authorized representative who has indicated his/her understanding and acceptance.  ? ? ? ?Dental Advisory Given ? ?Plan Discussed with: CRNA ? ?Anesthesia Plan Comments: (Discussed risks of anesthesia with patient, including possibility of difficulty with spontaneous ventilation under anesthesia necessitating airway intervention, PONV, and rare risks such as cardiac or respiratory or neurological events, and allergic reactions. Discussed the role of CRNA in patient's perioperative care. Patient understands. ? ?Has cardiologist note, optimized. Will place magnet over PPM ? ?Discussed r/b/a of interscalene block, including elective nature. Risks discussed: ?- Rare: bleeding, infection, nerve damage ?- shortness of breath from hemidiaphragmatic paralysis ?- unilateral horner's syndrome ?- poor/non-working blocks ?- reactions and toxicity to local anesthetic ?Patient understands and agrees. ?)  ? ? ? ? ? ?Anesthesia Quick Evaluation ? ?

## 2021-04-09 NOTE — Plan of Care (Signed)

## 2021-04-09 NOTE — H&P (Signed)
Paper H&P to be scanned into permanent record. H&P reviewed. No significant changes noted.  

## 2021-04-09 NOTE — Anesthesia Procedure Notes (Signed)
Anesthesia Regional Block: Interscalene brachial plexus block  ? ?Pre-Anesthetic Checklist: , timeout performed,  Correct Patient, Correct Site, Correct Laterality,  Correct Procedure, Correct Position, site marked,  Risks and benefits discussed,  Surgical consent,  Pre-op evaluation,  At surgeon's request and post-op pain management ? ?Laterality: Right ? ?Prep: chloraprep     ?  ?Needles:  ?Injection technique: Single-shot ? ?Needle Type: Echogenic Needle   ? ? ?Needle Length: 4cm  ?Needle Gauge: 25  ? ? ? ?Additional Needles: ? ? ?Procedures:,,,, ultrasound used (permanent image in chart),,    ?Narrative:  ?Injection made incrementally with aspirations every 5 mL. ? ?Performed by: Personally  ?Anesthesiologist: Aubert Choyce, MD ? ?Additional Notes: ?Patient's chart reviewed and they were deemed appropriate candidate for procedure, at surgeon's request. Patient educated about risks, benefits, and alternatives of the block including but not limited to: temporary or permanent nerve damage, bleeding, infection, damage to surround tissues, pneumothorax, hemidiaphragmatic paralysis, unilateral Horner's syndrome, block failure, local anesthetic toxicity. Patient expressed understanding. A formal time-out was conducted consistent with institution rules. ? ?Monitors were applied, and minimal sedation used (see nursing record). The site was prepped with skin prep and allowed to dry, and sterile gloves were used. A high frequency linear ultrasound probe with probe cover was utilized throughout. C5-7 nerve roots located and appeared anatomically normal, local anesthetic injected around them, and echogenic block needle trajectory was monitored throughout. Aspiration performed every 5ml. Lung and blood vessels were avoided. All injections were performed without resistance and free of blood and paresthesias. The patient tolerated the procedure well. ? ?Injectate: 10ml exparel + 10ml 0.5% bupivacaine  ? ? ? ?

## 2021-04-10 DIAGNOSIS — S42201A Unspecified fracture of upper end of right humerus, initial encounter for closed fracture: Secondary | ICD-10-CM | POA: Diagnosis not present

## 2021-04-10 LAB — BASIC METABOLIC PANEL
Anion gap: 8 (ref 5–15)
BUN: 20 mg/dL (ref 8–23)
CO2: 24 mmol/L (ref 22–32)
Calcium: 8.5 mg/dL — ABNORMAL LOW (ref 8.9–10.3)
Chloride: 106 mmol/L (ref 98–111)
Creatinine, Ser: 0.85 mg/dL (ref 0.44–1.00)
GFR, Estimated: >60 mL/min (ref >60)
Glucose, Bld: 140 mg/dL — ABNORMAL HIGH (ref 70–99)
Potassium: 4.5 mmol/L (ref 3.5–5.1)
Sodium: 138 mmol/L (ref 135–145)

## 2021-04-10 LAB — CBC
HCT: 26.3 % — ABNORMAL LOW (ref 36.0–46.0)
Hemoglobin: 8.3 g/dL — ABNORMAL LOW (ref 12.0–15.0)
MCH: 30.7 pg (ref 26.0–34.0)
MCHC: 31.6 g/dL (ref 30.0–36.0)
MCV: 97.4 fL (ref 80.0–100.0)
Platelets: 284 10*3/uL (ref 150–400)
RBC: 2.7 MIL/uL — ABNORMAL LOW (ref 3.87–5.11)
RDW: 15.9 % — ABNORMAL HIGH (ref 11.5–15.5)
WBC: 12.1 10*3/uL — ABNORMAL HIGH (ref 4.0–10.5)
nRBC: 0 % (ref 0.0–0.2)

## 2021-04-10 MED ORDER — ONDANSETRON HCL 4 MG PO TABS: 4.0000 mg | ORAL_TABLET | Freq: Four times a day (QID) | ORAL | 0 refills | Status: DC | PRN

## 2021-04-10 MED ORDER — OXYCODONE HCL 5 MG PO TABS: 2.5000 mg | ORAL_TABLET | ORAL | 0 refills | Status: DC | PRN

## 2021-04-10 NOTE — Progress Notes (Addendum)
Physical Therapy Treatment ?Patient Details ?Name: Alejandra Wiley ?MRN: [id number] ?DOB: January 11, 1933 ?Today's Date: 04/10/2021 ? ? ?History of Present Illness Pt is an 88 y.o. female s/p R reverse total shoulder arthroplasty and R biceps tenodesis (d/t R 3-part proximal humerus fx) with interscalene brachial plexus block 04/09/21.  PMH includes htn, CHF, pacemaker, anxiety, CVA, a-fib, bradycardia, SSS, hernia repair, R TKR with revision. ? ?  ?PT Comments  ? ? Pt resting in bed upon PT arrival and agreeable to PT session.  Semi-supine to sitting edge of bed modified independent; CGA progressing to SBA with transfers; and CGA progressing to SBA ambulating 360 feet.   Pt initially mildly shaky with ambulation so CGA provided for safety (pt reporting h/o tremors) and pt utilized [facility] for 120 feet; pt's mobility improving and then pt progressed to no assistive device use (no UE support) rest of ambulation distance (SBA).  No loss of balance noted during sessions activities.  Therapist educated pt and pt's spouse that pt currently appearing appropriate for HHPT with support from spouse at home (instead of SNF).  PT recommendations updated to HHPT--TOC notified immediately after therapy session. ?    ?Recommendations for follow up therapy are one component of a multi-disciplinary discharge planning process, led by the attending physician.  Recommendations may be updated based on patient status, additional functional criteria and insurance authorization. ? ?Follow Up Recommendations ? Home health PT ?  ?  ?Assistance Recommended at Discharge Frequent or constant Supervision/Assistance  ?Patient can return home with the following A little help with walking and/or transfers;A little help with bathing/dressing/bathroom;Assistance with cooking/housework;Assist for transportation ?  ?Equipment Recommendations ? None recommended by PT  ?  ?Recommendations for Other Services OT consult ? ? ?  ?Precautions / Restrictions  Precautions ?Precautions: Fall;Shoulder ?Shoulder Interventions: Timmothy Sours joy ultra sling;Shoulder abduction pillow;At all times;Off for dressing/bathing/exercises ?Precaution Booklet Issued: Yes (comment) ?Precaution Comments: R UE sling with abduction pillow may be off for dressing/bathing/exercises ?Restrictions ?Weight Bearing Restrictions: Yes ?RUE Weight Bearing: Non weight bearing ?Other Position/Activity Restrictions: Per orders: No PROM of shoulder.  Please instruct on pendulums.  Also work on hand, wrist, and elbow ROM.  ?  ? ?Mobility ? Bed Mobility ?Overal bed mobility: Modified Independent ?Bed Mobility: Supine to Sit ?  ?  ?Supine to sit: Modified independent (Device/Increase time) (use of bed rail) ?  ?  ?General bed mobility comments: mild increased effort/time to perform on own ?  ? ?Transfers ?Overall transfer level: Needs assistance ?Equipment used: None ?Transfers: Sit to/from Stand ?Sit to Stand: Min guard, Supervision (CGA from bed; SBA from toilet) ?  ?Step pivot transfers: Supervision (stand step turn to toilet) ?  ?  ?  ?General transfer comment: x1 trial standing from bed and x1 trial standing from toilet; steady with transfers ?  ? ?Ambulation/Gait ?Ambulation/Gait assistance: Min guard, Supervision ?Gait Distance (Feet): 360 Feet ?Assistive device: Straight cane, None ?  ?Gait velocity: mildly decreased ?  ?  ?General Gait Details: pt initially mildly shaky (CGA provided for safety) so utilized [facility] for 120 feet but then pt progressed to no assistive device use (no UE support) rest of ambulation distance (SBA) ? ? ?Stairs ?  ?  ?  ?  ?  ? ? ?Wheelchair Mobility ?  ? ?Modified Rankin (Stroke Patients Only) ?  ? ? ?  ?Balance Overall balance assessment: Needs assistance ?Sitting-balance support: No upper extremity supported, Feet supported ?Sitting balance-Leahy Scale: Normal ?Sitting balance - Comments: steady sitting reaching  outside BOS with L UE ?  ?Standing balance support: No upper  extremity supported ?Standing balance-Leahy Scale: Good ?Standing balance comment: steady standing managing underwear with L UE (for toileting) and washing L hand at sink ?  ?  ?  ?  ?  ?  ?  ?  ?  ?  ?  ?  ? ?  ?Cognition Arousal/Alertness: Awake/alert ?Behavior During Therapy: Anxious ?Overall Cognitive Status: Within Functional Limits for tasks assessed ?  ?  ?  ?  ?  ?  ?  ?  ?  ?  ?  ?  ?  ?  ?  ?  ?General Comments: Pt requiring extra time for activities ?  ?  ? ?  ?Exercises   ? ?  ?General Comments General comments (skin integrity, edema, etc.): R UE in shoulder sling with abduction pillow ?  ?  ? ?Pertinent Vitals/Pain Pain Assessment ?Pain Assessment: Faces ?Faces Pain Scale: Hurts little more ?Pain Location: R UE ?Pain Descriptors / Indicators: Aching, Discomfort, Grimacing ?Pain Intervention(s): Limited activity within patient's tolerance, Monitored during session, Repositioned, Other (comment) (polar care in place) ?Vitals (HR and O2 on room air) stable and WFL throughout treatment session.  ? ? ?Home Living Family/patient expects to be discharged to:: Private residence ?Living Arrangements: Spouse/significant other ?Available Help at Discharge: Family;Available PRN/intermittently ?Type of Home: House ?Home Access: Level entry ?  ?  ?  ?Home Layout: Two level;Able to live on main level with bedroom/bathroom ?Home Equipment: Cane - single Barista (2 wheels) ?Additional Comments: Normally independent and driving  ?  ?Prior Function    ?  ?  ?   ? ?PT Goals (current goals can now be found in the care plan section) Acute Rehab PT Goals ?Patient Stated Goal: to improve mobility and independence ?PT Goal Formulation: With patient ?Time For Goal Achievement: 04/23/21 ?Potential to Achieve Goals: Good ?Progress towards PT goals: Progressing toward goals ? ?  ?Frequency ? ? ? BID ? ? ? ?  ?PT Plan Discharge plan needs to be updated (TOC notified)  ? ? ?Co-evaluation   ?  ?  ?  ?  ? ?  ?AM-PAC PT "6  Clicks" Mobility   ?Outcome Measure ? Help needed turning from your back to your side while in a flat bed without using bedrails?: A Little ?Help needed moving from lying on your back to sitting on the side of a flat bed without using bedrails?: A Little ?Help needed moving to and from a bed to a chair (including a wheelchair)?: A Little ?Help needed standing up from a chair using your arms (e.g., wheelchair or bedside chair)?: A Little ?Help needed to walk in hospital room?: A Little ?Help needed climbing 3-5 steps with a railing? : A Little ?6 Click Score: 18 ? ?  ?End of Session Equipment Utilized During Treatment: Gait belt ?Activity Tolerance: Patient tolerated treatment well ?Patient left: in chair;with call bell/phone within reach;with chair alarm set;with family/visitor present ?Nurse Communication: Mobility status;Precautions ?PT Visit Diagnosis: Unsteadiness on feet (R26.81);Muscle weakness (generalized) (M62.81);History of falling (Z91.81);Pain ?Pain - Right/Left: Right ?Pain - part of body: Shoulder ?  ? ? ?Time: 1607-3710 ?PT Time Calculation (min) (ACUTE ONLY): 24 min ? ?Charges:  $Gait Training: 8-22 mins ?$Therapeutic Activity: 8-22 mins          ?          ?Leitha Bleak, PT ?04/10/21, 12:04 PM ? ? ?

## 2021-04-10 NOTE — Discharge Summary (Signed)
Physician Discharge Summary  ?Subjective: ?1 Day Post-Op Procedure(s) (LRB): ?Right reverse shoulder arthroplasty for fracture (Right) ?BICEPS TENODESIS (Right) ?Patient reports pain as mild.   ?Patient seen in rounds with Dr. Posey Pronto. ?Patient is well, and has had no acute complaints or problems.  She is complaining of some index finger stiffness and numbness. ?Patient is ready to go home after physical therapy ? ?Physician Discharge Summary  ?Patient ID: ?Alejandra Wiley ?MRN: [id number] ?DOB/AGE: 1932/05/15 88 y.o. ? ?Admit date: 04/09/2021 ?Discharge date: 04/10/2021 ? ?Admission Diagnoses: ? ?Discharge Diagnoses:  ?Principal Problem: ?  Proximal humerus fracture ? ? ?Discharged Condition: fair ? ?Hospital Course: The patient is postop day 1 from a right reverse shoulder replacement.  She did well during the night.  She had mild pain throughout the night.  She is complaining of her index finger with stiffness and some swelling.  She is complaining of some numbness into her hand.  Her vitals have remained stable. ? ?Treatments: surgery:  ?1. Right reverse total shoulder arthroplasty ?2. Right biceps tenodesis ?  ?SURGEON: Cato Mulligan, MD ?  ?ASSISTANTS: Reche Dixon, PA ?  ?ANESTHESIA: Gen + interscalene block ?  ?ESTIMATED BLOOD LOSS: 200cc ?  ?TOTAL IV FLUIDS: see anesthesia record ?  ?IMPLANTS: ?Tornier Perform 72m Glenoid Baseplate with 4 associated glenoid baseplate screws; Eccentric + 260mglenosphere; Aequalis Flex Revive 1178mres Fit stem with 14m44moximal body with 20mm44mcer, High (3.5mm) 51mentric Reversed Tray; Reversed Insert +6mm ? 19mscharge Exam: ?Blood pressure (!) 161/67, pulse 80, temperature 97.8 ?F (36.6 ?C), resp. rate 16, height '5\' 6"'$  (1.676 m), weight 57.3 kg, SpO2 98 %. ? ? ?Disposition:  ? ? ?Allergies as of 04/10/2021   ? ?   Reactions  ? Latex Other (See Comments)  ? blisters  ? Other Other (See Comments)  ? Blisters/ paper tape is Ok ?Blisters/ paper tape is Ok ?Blisters/ paper tape is  Ok ?Blisters/ paper tape is Ok  ? Tape Other (See Comments)  ? Blisters/ paper tape is Ok ?Blisters/ paper tape is Ok  ? Xarelto [rivaroxaban] Other (See Comments)  ? Gums bleeding and too much bruising  ? Nickel Rash, Other (See Comments)  ? ?  ? ?  ?Medication List  ?  ? ?TAKE these medications   ? ?acetaminophen 500 MG tablet ?Commonly known as: TYLENOL ?Take 500 mg by mouth every 8 (eight) hours as needed for mild pain or headache. ?  ?amiodarone 200 MG tablet ?Commonly known as: PACERONE ?Take 1 tablet (200 mg total) by mouth daily. ?  ?apixaban 2.5 MG Tabs tablet ?Commonly known as: Eliquis ?Take 1 tablet (2.5 mg total) by mouth 2 (two) times daily. ?  ?fluticasone 50 MCG/ACT nasal spray ?Commonly known as: FLONASE ?Place 2 sprays into both nostrils daily as needed for allergies or rhinitis. ?  ?furosemide 40 MG tablet ?Commonly known as: Lasix ?Take 1 tablet (40 mg total) by mouth daily. ?What changed:  ?when to take this ?reasons to take this ?  ?ibandronate 150 MG tablet ?Commonly known as: BONIVA ?Take 150 mg by mouth every 30 (thirty) days. Take in the morning with a full glass of water, on an empty stomach, and do not take anything else by mouth or lie down for the next 30 min. ?  ?lidocaine 5 % ?Commonly known as: Lidoderm ?Place 1 patch onto the skin every 12 (twelve) hours. Remove & Discard patch within 12 hours or as directed by MD ?  ?metoprolol succinate  50 MG 24 hr tablet ?Commonly known as: TOPROL-XL ?TAKE 1 TABLET BY MOUTH DAILY. TAKE WITH OR IMMEDIATELY FOLLOWING A MEAL. ?  ?ondansetron 4 MG tablet ?Commonly known as: ZOFRAN ?Take 1 tablet (4 mg total) by mouth every 6 (six) hours as needed for nausea. ?  ?oxyCODONE 5 MG immediate release tablet ?Commonly known as: Roxicodone ?Take 1 tablet (5 mg total) by mouth every 8 (eight) hours as needed. ?What changed: Another medication with the same name was added. Make sure you understand how and when to take each. ?  ?oxyCODONE 5 MG immediate release  tablet ?Commonly known as: Oxy IR/ROXICODONE ?Take 0.5-1 tablets (2.5-5 mg total) by mouth every 4 (four) hours as needed for moderate pain (pain score 4-6). ?What changed: You were already taking a medication with the same name, and this prescription was added. Make sure you understand how and when to take each. ?  ?potassium chloride SA 20 MEQ tablet ?Commonly known as: KLOR-CON M ?TAKE 1/2 TABLET TWICE DAILY (EVERY MORNING AND EVERY EVENING) ?What changed: See the new instructions. ?  ?SENIOR MULTIVITAMIN PLUS PO ?Take 1 tablet by mouth daily. ?  ?ZINC PO ?Take by mouth as needed. ?  ? ?  ? ? Follow-up Information   ? ? Leim Fabry, MD Follow up in 2 week(s).   ?Specialty: Orthopedic Surgery ?Why: For wound care and x-rays of the right shoulder ?Contact information: ?[address] ?[place] [address] ?[phone] ? ? ?  ?  ? ?  ?  ? ?  ? ? ?Signed: ?Dessie Delcarlo ?04/10/2021, 7:09 AM ? ? ?Objective: ?Vital signs in last 24 hours: ?Temp:  [96.8 ?F (36 ?C)-98.6 ?F (37 ?C)] 97.8 ?F (36.6 ?C) (03/24 9735) ?Pulse Rate:  [70-80] 80 (03/24 0610) ?Resp:  [12-24] 16 (03/24 0610) ?BP: (109-193)/(52-77) 161/67 (03/24 0610) ?SpO2:  [91 %-100 %] 98 % (03/24 0610) ? ?Intake/Output from previous day: ? ?Intake/Output Summary (Last 24 hours) at 04/10/2021 0709 ?Last data filed at 04/10/2021 0500 ?Gross per 24 hour  ?Intake 1546.12 ml  ?Output 1650 ml  ?Net -103.88 ml  ?  ?Intake/Output this shift: ?No intake/output data recorded. ? ?Labs: ?Recent Labs  ?  04/10/21 ?3299  ?HGB 8.3*  ? ?Recent Labs  ?  04/10/21 ?2426  ?WBC 12.1*  ?RBC 2.70*  ?HCT 26.3*  ?PLT 284  ? ?Recent Labs  ?  04/10/21 ?8341  ?NA 138  ?K 4.5  ?CL 106  ?CO2 24  ?BUN 20  ?CREATININE 0.85  ?GLUCOSE 140*  ?CALCIUM 8.5*  ? ?No results for input(s): LABPT, INR in the last 72 hours. ? ?EXAM: ?General - Patient is Alert and Oriented ?Extremity - Neurovascular intact ?Sensation intact distally ?Dorsiflexion/Plantar flexion intact ?Incision - clean, dry, blood  tinged drainage ?Motor Function -grip strength intact.  Inability to actively flex her index finger but passively full motion. ? ?Assessment/Plan: ?1 Day Post-Op Procedure(s) (LRB): ?Right reverse shoulder arthroplasty for fracture (Right) ?BICEPS TENODESIS (Right) ?Procedure(s) (LRB): ?Right reverse shoulder arthroplasty for fracture (Right) ?BICEPS TENODESIS (Right) ?Past Medical History:  ?Diagnosis Date  ? Anxiety   ? Aortic atherosclerosis ([place])   ? Arthritis   ? Bradycardia   ? 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 a.) CNB (+) moderate epithelial hyperplasia without atypia and fibrocystic changes  ? CHF (congestive heart failure) ([place])   ? Complication of anesthesia   ? heart stopped at the end of surgery with Dr. Rudene Christians during a back surgery in approx 2019.  ?  Hypertension   ? Long term current use of anticoagulant   ? a.) dose reduced apixaban  ? MRSA nasal colonization 09/17/2016  ? Pacemaker 12/06/2003  ? Medtronic Impulse [id number]  ? Pacemaker 2015  ? PAF (paroxysmal atrial fibrillation) (H[facility])   ? a.) CHA2DS2-VASc = 8 (age x 2, sex, CHF, HTN, CVA x 2, aortic plaque). b.) rate/rhythm maintained on oral amiodarone + metoprolol succinate; chronically anticoagulated with dose reduced apixaban  ? Sick sinus syndrome ([place])   ? Sinoatrial node dysfunction (HCC)   ? Stroke [facility])   ? Vaginal intraepithelial neoplasia   ? VAIN (vaginal intraepithelial neoplasia)   ? ?Principal Problem: ?  Proximal humerus fracture ? ?Estimated body mass index is 20.39 kg/m? as calculated from the following: ?  Height as of this encounter: '5\' 6"'$  (1.676 m). ?  Weight as of this encounter: 57.3 kg. ?Advance diet ?Up with therapy ?D/C IV fluids ?Diet - Regular diet ?Follow up - in 2 weeks ?Activity -right arm in the shoulder immobilizer at all times except for bathing and range of motion activities. ?Disposition - Home ?Condition Upon Discharge - Stable ?DVT Prophylaxis -  Eliquis ? ?Reche Dixon, PA-C ?Orthopaedic Surgery ?04/10/2021, 7:09  AM ? ?

## 2021-04-10 NOTE — NC FL2 (Signed)
?[place] MEDICAID FL2 LEVEL OF CARE SCREENING TOOL  ?  ? ?IDENTIFICATION  ?Patient Name: ?Alejandra Wiley Birthdate: 1932-06-25 Sex: female Admission Date (Current Location): ?04/09/2021  ?[place] and [place] Number: ? [place] ?  Facility and Address:  ?[facility], [address], [place], [place] [address] ?     Provider Number: ?[id number]  ?Attending Physician Name and Address:  ?No att. providers found ? Relative Name and Phone Number:  ?  ?   ?Current Level of Care: ?Hospital Recommended Level of Care: ?[place] Prior Approval Number: ?  ? ?Date Approved/Denied: ?  PASRR Number: ?[id number] A ? ?Discharge Plan: ?SNF ?  ? ?Current Diagnoses: ?Patient Active Problem List  ? Diagnosis Date Noted  ? Proximal humerus fracture 04/09/2021  ? Acute HFrEF (heart failure with reduced ejection fraction) ([place])   ? Atrial fibrillation with rapid ventricular response ([place]) 05/19/2020  ? Atrial fibrillation with RVR ([place]) 05/17/2020  ? Generalized weakness 05/17/2020  ? Chronic systolic (congestive) heart failure ([place]) 03/31/2018  ? Arm weakness 02/09/2018  ? UTI (urinary tract infection) 11/28/2017  ? Protein calorie malnutrition ([place]) 10/10/2017  ? Falls 10/10/2017  ? Weakness 10/10/2017  ? Muscle atrophy 10/10/2017  ? Sinus node dysfunction ([place]) 10/10/2017  ? Syncope and collapse 09/12/2017  ? Pyelonephritis 08/26/2017  ? Chronic diastolic heart failure ([place]) 07/19/2017  ? Hypertension 07/19/2017  ? Painful total knee replacement ([place]) 09/23/2016  ? Fungal sinusitis 11/08/2013  ? ATRIAL FIBRILLATION 06/13/2008  ? PACEMAKER-Medtronic 06/13/2008  ? ? ?Orientation RESPIRATION BLADDER Height & Weight   ?  ?Self, Time, Situation, Place ? Normal Continent Weight: 126 lb 5.2 oz (57.3 kg) ?Height:  '5\' 6"'$  (167.6 cm)  ?BEHAVIORAL SYMPTOMS/MOOD NEUROLOGICAL BOWEL NUTRITION STATUS  ?    Continent Diet (regular diet thin liquids)  ?AMBULATORY STATUS COMMUNICATION OF NEEDS Skin   ?Limited  Assist Verbally Surgical wounds (closed incision right shoulder) ?  ?  ?  ?    ?     ?     ? ? ?Personal Care Assistance Level of Assistance  ?Bathing, Feeding, Dressing Bathing Assistance: Limited assistance ?Feeding assistance: Independent ?Dressing Assistance: Limited assistance ?   ? ?Functional Limitations Info  ?Hearing, Sight, Speech Sight Info: Adequate ?Hearing Info: Adequate ?Speech Info: Adequate  ? ? ?SPECIAL CARE FACTORS FREQUENCY  ?PT (By licensed PT), OT (By licensed OT)   ?  ?PT Frequency: 5x ?OT Frequency: 5x ?  ?  ?  ?   ? ? ?Contractures Contractures Info: Not present  ? ? ?Additional Factors Info  ?Code Status, Allergies Code Status Info: full code ?Allergies Info: Latex   Other   Tape   Xarelto (Rivaroxaban)   Nickel ?  ?  ?  ?   ? ?Current Medications (04/10/2021):  This is the current hospital active medication list ?Current Facility-Administered Medications  ?Medication Dose Route Frequency Provider Last Rate Last Admin  ? 0.9 %  sodium chloride infusion   Intravenous Continuous Leim Fabry, MD   Stopped at 04/10/21 0654  ? acetaminophen (TYLENOL) tablet 1,000 mg  1,000 mg Oral Q8H Leim Fabry, MD   1,000 mg at 04/10/21 0654  ? alum & mag hydroxide-simeth (MAALOX/MYLANTA) 200-200-20 MG/5ML suspension 30 mL  30 mL Oral Q4H PRN Leim Fabry, MD      ? amiodarone (PACERONE) tablet 200 mg  200 mg Oral Daily Leim Fabry, MD   200 mg at 04/10/21 1024  ? apixaban (ELIQUIS) tablet 2.5 mg  2.5  mg Oral BID Leim Fabry, MD   2.5 mg at 04/10/21 1024  ? bisacodyl (DULCOLAX) suppository 10 mg  10 mg Rectal Daily PRN Leim Fabry, MD      ? docusate sodium (COLACE) capsule 100 mg  100 mg Oral BID Leim Fabry, MD   100 mg at 04/10/21 1024  ? HYDROmorphone (DILAUDID) injection 0.2-0.4 mg  0.2-0.4 mg Intravenous Q4H PRN Leim Fabry, MD      ? lip balm (BLISTEX) ointment   Topical PRN Leim Fabry, MD      ? menthol-cetylpyridinium (CEPACOL) lozenge 3 mg  1 lozenge Oral PRN Leim Fabry, MD      ? Or  ?  phenol (CHLORASEPTIC) mouth spray 1 spray  1 spray Mouth/Throat PRN Leim Fabry, MD      ? metoCLOPramide (REGLAN) tablet 5-10 mg  5-10 mg Oral Q8H PRN Leim Fabry, MD      ? Or  ? metoCLOPramide (REGLAN) injection 5-10 mg  5-10 mg Intravenous Q8H PRN Leim Fabry, MD      ? metoprolol succinate (TOPROL-XL) 24 hr tablet 50 mg  50 mg Oral Daily Leim Fabry, MD   50 mg at 04/10/21 1024  ? ondansetron (ZOFRAN) tablet 4 mg  4 mg Oral Q6H PRN Leim Fabry, MD      ? Or  ? ondansetron [facility]) injection 4 mg  4 mg Intravenous Q6H PRN Leim Fabry, MD      ? oxyCODONE (Oxy IR/ROXICODONE) immediate release tablet 2.5-5 mg  2.5-5 mg Oral Q4H PRN Leim Fabry, MD   2.5 mg at 04/10/21 1038  ? oxyCODONE (Oxy IR/ROXICODONE) immediate release tablet 5-10 mg  5-10 mg Oral Q4H PRN Leim Fabry, MD      ? senna-docusate (Senokot-S) tablet 1 tablet  1 tablet Oral QHS PRN Leim Fabry, MD   1 tablet at 04/10/21 1024  ? ?Current Outpatient Medications  ?Medication Sig Dispense Refill  ? acetaminophen (TYLENOL) 500 MG tablet Take 500 mg by mouth every 8 (eight) hours as needed for mild pain or headache.    ? amiodarone (PACERONE) 200 MG tablet Take 1 tablet (200 mg total) by mouth daily. 90 tablet 1  ? ibandronate (BONIVA) 150 MG tablet Take 150 mg by mouth every 30 (thirty) days. Take in the morning with a full glass of water, on an empty stomach, and do not take anything else by mouth or lie down for the next 30 min.    ? lidocaine (LIDODERM) 5 % Place 1 patch onto the skin every 12 (twelve) hours. Remove & Discard patch within 12 hours or as directed by MD 10 patch 0  ? metoprolol succinate (TOPROL-XL) 50 MG 24 hr tablet TAKE 1 TABLET BY MOUTH DAILY. TAKE WITH OR IMMEDIATELY FOLLOWING A MEAL. 30 tablet 1  ? potassium chloride SA (KLOR-CON) 20 MEQ tablet TAKE 1/2 TABLET TWICE DAILY (EVERY MORNING AND EVERY EVENING) (Patient taking differently: Take 10 mEq by mouth daily as needed (leg cramps).) 90 tablet 3  ? apixaban (ELIQUIS) 2.5  MG TABS tablet Take 1 tablet (2.5 mg total) by mouth 2 (two) times daily. 180 tablet 0  ? fluticasone (FLONASE) 50 MCG/ACT nasal spray Place 2 sprays into both nostrils daily as needed for allergies or rhinitis.  (Patient not taking: Reported on 04/09/2021)    ? furosemide (LASIX) 40 MG tablet Take 1 tablet (40 mg total) by mouth daily. (Patient taking differently: Take 40 mg by mouth as needed.) 30 tablet 2  ? Multiple Vitamins-Minerals (  SENIOR MULTIVITAMIN PLUS PO) Take 1 tablet by mouth daily.    ? Multiple Vitamins-Minerals (ZINC PO) Take by mouth as needed.    ? ondansetron (ZOFRAN) 4 MG tablet Take 1 tablet (4 mg total) by mouth every 6 (six) hours as needed for nausea. 20 tablet 0  ? oxyCODONE (OXY IR/ROXICODONE) 5 MG immediate release tablet Take 0.5-1 tablets (2.5-5 mg total) by mouth every 4 (four) hours as needed for moderate pain (pain score 4-6). 30 tablet 0  ? oxyCODONE (ROXICODONE) 5 MG immediate release tablet Take 1 tablet (5 mg total) by mouth every 8 (eight) hours as needed. 20 tablet 0  ? ? ? ?Discharge Medications: ?Please see discharge summary for a list of discharge medications. ? ?Relevant Imaging Results: ? ?Relevant Lab Results: ? ? ?Additional Information ?YOV:[id number] ? ?Allura Doepke A Harleyquinn Gasser, LCSW ? ? ? ? ?

## 2021-04-10 NOTE — Evaluation (Signed)
Occupational Therapy Evaluation ?Patient Details ?Name: Alejandra Wiley ?MRN: [id number] ?DOB: 1932/01/19 ?Today's Date: 04/10/2021 ? ? ?History of Present Illness Pt is an 88 y.o. female s/p R reverse total shoulder arthroplasty and R biceps tenodesis (d/t R 3-part proximal humerus fx) with interscalene brachial plexus block 04/09/21.  PMH includes htn, CHF, pacemaker, anxiety, CVA, a-fib, bradycardia, SSS, hernia repair, R TKR with revision.  ? ?Clinical Impression ?  ?Pt seated in recliner chair with husband present in room and agreeable to OT evaluation and education. OT educated pt and spouse on NWB precautions, sling use, hand/wrist/elbow exercises, polar care, and ADLs. Handout provided as well. Pt's spouse returns demonstrations and assists pt with donning sling, polar care, and UB clothing this session. Pt able to thread LB clothing and pull over B hips with sit <>stand with supervision - min guard. Pt doing very well this session. Pt and caregiver with no further questions this session. Pt with no further skilled acute OT need at this time. OT to SIGN OFF.  ?   ? ?Recommendations for follow up therapy are one component of a multi-disciplinary discharge planning process, led by the attending physician.  Recommendations may be updated based on patient status, additional functional criteria and insurance authorization.  ? ?Follow Up Recommendations ? Home health OT  ?  ?Assistance Recommended at Discharge Intermittent Supervision/Assistance  ?Patient can return home with the following A little help with walking and/or transfers;A little help with bathing/dressing/bathroom;Help with stairs or ramp for entrance;Assist for transportation;Assistance with cooking/housework ? ?  ?Functional Status Assessment ? Patient has had a recent decline in their functional status and demonstrates the ability to make significant improvements in function in a reasonable and predictable amount of time.  ?Equipment  Recommendations ? None recommended by OT  ?  ?   ?Precautions / Restrictions Precautions ?Precautions: Fall;Shoulder ?Shoulder Interventions: Timmothy Sours joy ultra sling;Shoulder abduction pillow;At all times;Off for dressing/bathing/exercises ?Restrictions ?Weight Bearing Restrictions: Yes ?RUE Weight Bearing: Non weight bearing  ? ?  ? ?Mobility Bed Mobility ?  ?  ?  ?  ?  ?  ?  ?General bed mobility comments: seated in recliner chair ?  ? ?Transfers ?Overall transfer level: Needs assistance ?Equipment used: 1 person hand held assist ?Transfers: Sit to/from Stand, Bed to chair/wheelchair/BSC ?Sit to Stand: Supervision ?  ?  ?Step pivot transfers: Supervision ?  ?  ?  ?  ? ?  ?Balance Overall balance assessment: Needs assistance ?Sitting-balance support: No upper extremity supported, Feet supported ?Sitting balance-Leahy Scale: Good ?Sitting balance - Comments: steady sitting reaching within BOS with L UE ?  ?Standing balance support: Single extremity supported, During functional activity ?Standing balance-Leahy Scale: Fair ?  ?  ?  ?  ?  ?  ?  ?  ?  ?  ?  ?  ?   ? ?ADL either performed or assessed with clinical judgement  ? ?ADL Overall ADL's : Needs assistance/impaired ?  ?  ?  ?  ?  ?  ?  ?  ?  ?  ?  ?  ?  ?  ?  ?  ?  ?  ?  ?General ADL Comments: set up A for LB dressing and min - mod A to don shirt, polar care, and sling with education to spouse for proper technique to maintain precautions  ? ? ? ?Vision Patient Visual Report: No change from baseline ?   ?   ?   ?   ? ?  Pertinent Vitals/Pain Pain Assessment ?Pain Assessment: Faces ?Faces Pain Scale: Hurts even more ?Pain Location: R UE ?Pain Descriptors / Indicators: Aching, Discomfort, Grimacing ?Pain Intervention(s): Limited activity within patient's tolerance, Monitored during session, Repositioned  ? ? ? ?Hand Dominance Right ?  ?Extremity/Trunk Assessment Upper Extremity Assessment ?Upper Extremity Assessment: RUE deficits/detail ?RUE Deficits / Details: not  formally tested secondary to being NWB and no AROM ?  ?  ?  ?  ?  ?Communication Communication ?Communication: No difficulties ?  ?Cognition Arousal/Alertness: Awake/alert ?Behavior During Therapy: Anxious ?Overall Cognitive Status: Within Functional Limits for tasks assessed ?  ?  ?  ?  ?  ?  ?  ?  ?  ?  ?  ?  ?  ?  ?  ?  ?General Comments: Pt requiring extra time for activities ?  ?  ?   ?   ?   ? ? ?Home Living Family/patient expects to be discharged to:: Private residence ?Living Arrangements: Spouse/significant other ?Available Help at Discharge: Family;Available PRN/intermittently ?Type of Home: House ?Home Access: Level entry ?  ?  ?Home Layout: Two level;Able to live on main level with bedroom/bathroom ?  ?  ?Bathroom Shower/Tub: Walk-in shower ?  ?Bathroom Toilet: Handicapped height ?  ?  ?Home Equipment: Cane - single Barista (2 wheels) ?  ?Additional Comments: Normally independent and driving ?  ? ?  ?Prior Functioning/Environment Prior Level of Function : Independent/Modified Independent ?  ?  ?  ?  ?  ?  ?Mobility Comments: Takes yoga.  Last couple weeks (since fall) pt has required hand hold assist of her husband to walk ?ADLs Comments: Normally independent but requiring assist last couple weeks since fall. ?  ? ?  ?  ?   ?   ?   ?OT Goals(Current goals can be found in the care plan section) Acute Rehab OT Goals ?Patient Stated Goal: to go home ?OT Goal Formulation: With patient/family ?Time For Goal Achievement: 04/10/21 ?Potential to Achieve Goals: Fair  ?OT Frequency:   ?  ? ?   ?AM-PAC OT "6 Clicks" Daily Activity     ?Outcome Measure Help from another person eating meals?: None ?Help from another person taking care of personal grooming?: None ?Help from another person toileting, which includes using toliet, bedpan, or urinal?: A Little ?Help from another person bathing (including washing, rinsing, drying)?: A Little ?Help from another person to put on and taking off regular upper body  clothing?: A Little ?Help from another person to put on and taking off regular lower body clothing?: A Little ?6 Click Score: 20 ?  ?End of Session Equipment Utilized During Treatment: Other (comment) (sling) ?Nurse Communication: Mobility status ? ?Activity Tolerance: Patient tolerated treatment well ?Patient left: in chair;with call bell/phone within reach;with family/visitor present ? ?   ?              ?Time: 2800-3491 ?OT Time Calculation (min): 41 min ?Charges:  OT General Charges ?$OT Visit: 1 Visit ?OT Evaluation ?$OT Eval Moderate Complexity: 1 Mod ?OT Treatments ?$Self Care/Home Management : 23-37 mins ? ?Darleen Crocker, [place], OTR/L , CBIS ?ascom [phone]  ?04/10/21, 12:27 PM  ?

## 2021-04-10 NOTE — Progress Notes (Signed)
Pt reports having urine incontinence.  She is requesting a diaper or external catheter until her family can bring her personal pads.  Ortho PA made aware and agrees with plan of care.  ?

## 2021-04-10 NOTE — Progress Notes (Signed)
?Subjective: ?1 Day Post-Op Procedure(s) (LRB): ?Right reverse shoulder arthroplasty for fracture (Right) ?BICEPS TENODESIS (Right) ?Patient reports pain as mild.   ?Patient is well, and has had no acute complaints or problems.  She is complaining about numbness into her hand and some difficulty with bending her index finger. ?Plan is to go Home after hospital stay. ?Negative for chest pain and shortness of breath ?Fever: no ?Gastrointestinal: Negative for nausea and vomiting ? ?Objective: ?Vital signs in last 24 hours: ?Temp:  [96.8 ?F (36 ?C)-98.6 ?F (37 ?C)] 97.8 ?F (36.6 ?C) (03/24 5427) ?Pulse Rate:  [70-80] 80 (03/24 0610) ?Resp:  [12-24] 16 (03/24 0610) ?BP: (109-193)/(52-77) 161/67 (03/24 0610) ?SpO2:  [91 %-100 %] 98 % (03/24 0610) ? ?Intake/Output from previous day: ? ?Intake/Output Summary (Last 24 hours) at 04/10/2021 0702 ?Last data filed at 04/10/2021 0500 ?Gross per 24 hour  ?Intake 1546.12 ml  ?Output 1650 ml  ?Net -103.88 ml  ?  ?Intake/Output this shift: ?No intake/output data recorded. ? ?Labs: ?Recent Labs  ?  04/10/21 ?0623  ?HGB 8.3*  ? ?Recent Labs  ?  04/10/21 ?7628  ?WBC 12.1*  ?RBC 2.70*  ?HCT 26.3*  ?PLT 284  ? ?Recent Labs  ?  04/10/21 ?3151  ?NA 138  ?K 4.5  ?CL 106  ?CO2 24  ?BUN 20  ?CREATININE 0.85  ?GLUCOSE 140*  ?CALCIUM 8.5*  ? ?No results for input(s): LABPT, INR in the last 72 hours. ? ? ?EXAM ?General - Patient is Alert and Oriented ?Extremity - Neurovascular intact ?Sensation intact distally ?Dorsiflexion/Plantar flexion intact ?Compartment soft some difficulty with flexing the index finger.  Mild sensation loss at the index and thumb fingertip. ?Dressing/Incision - clean, dry, blood tinged drainage ?Motor Function - intact, moving wrist and hand well on exam.  Some difficulty with active index finger flexion but full passive motion. ? ?Past Medical History:  ?Diagnosis Date  ? Anxiety   ? Aortic atherosclerosis ([place])   ? Arthritis   ? Bradycardia   ? Breast fibroadenoma, left  05/12/2001  ? a.) CNB (+) moderate epithelial hyperplasia without atypia and fibrocystic changes  ? CHF (congestive heart failure) ([place])   ? Complication of anesthesia   ? heart stopped at the end of surgery with Dr. Rudene Christians during a back surgery in approx 2019.  ? Hypertension   ? Long term current use of anticoagulant   ? a.) dose reduced apixaban  ? MRSA nasal colonization 09/17/2016  ? Pacemaker 12/06/2003  ? Medtronic Impulse DTDR01  ? Pacemaker 2015  ? PAF (paroxysmal atrial fibrillation) (HCC)   ? a.) CHA2DS2-VASc = 8 (age x 2, sex, CHF, HTN, CVA x 2, aortic plaque). b.) rate/rhythm maintained on oral amiodarone + metoprolol succinate; chronically anticoagulated with dose reduced apixaban  ? Sick sinus syndrome ([place])   ? Sinoatrial node dysfunction (HCC)   ? Stroke [facility])   ? Vaginal intraepithelial neoplasia   ? VAIN (vaginal intraepithelial neoplasia)   ? ? ?Assessment/Plan: ?1 Day Post-Op Procedure(s) (LRB): ?Right reverse shoulder arthroplasty for fracture (Right) ?BICEPS TENODESIS (Right) ?Principal Problem: ?  Proximal humerus fracture ? ?Estimated body mass index is 20.39 kg/m? as calculated from the following: ?  Height as of this encounter: '5\' 6"'$  (1.676 m). ?  Weight as of this encounter: 57.3 kg. ?Advance diet ?Up with therapy ?D/C IV fluids ? ?DVT Prophylaxis -  Eliquis ?Shoulder immobilizer on right arm at all times.  Remove only for bathing and range of motion activities. ? ?Sherren Mocha  [place], PA-C ?Orthopaedic Surgery ?04/10/2021, 7:02 AM ? ?

## 2021-04-10 NOTE — Plan of Care (Signed)

## 2021-04-14 ENCOUNTER — Telehealth: Payer: Self-pay | Admitting: Anesthesiology

## 2021-04-14 NOTE — Progress Notes (Addendum)
?  Patient POD #5 s/p right shoulder arthroplasty. Called patient to check on her after interscalene block. ? ?Patient says the analgesia has been great and lasted about two days. She does say however that her right hand has been uncomfortably swollen after the surgery. She says her strength is intact but limited by the swelling. Motor function continues to improve s/p nerve block. She is also telling of right thumb numbness after the block. I advised patient that isolated hand swelling would be an unusual result of the nerve block and advised her to inform her surgeon's office. As for the numbness, she is 5 days out from an exparel interscalene block, and still within a reasonable window of having some scattered numbness. As long as it continues to improve she should be OK. Advised her to call if numbness does not improve or gets worse. Patient is grateful and understood ?

## 2021-04-16 ENCOUNTER — Ambulatory Visit (INDEPENDENT_AMBULATORY_CARE_PROVIDER_SITE_OTHER): Payer: Medicare Other

## 2021-04-16 DIAGNOSIS — I495 Sick sinus syndrome: Secondary | ICD-10-CM | POA: Diagnosis not present

## 2021-04-16 LAB — CUP PACEART REMOTE DEVICE CHECK
Battery Impedance: 1864 Ohm
Battery Remaining Longevity: 37 mo
Battery Voltage: 2.75 V
Brady Statistic AP VP Percent: 3 %
Brady Statistic AP VS Percent: 87 %
Brady Statistic AS VP Percent: 1 %
Brady Statistic AS VS Percent: 9 %
Date Time Interrogation Session: 20230330122849
Implantable Lead Implant Date: 20051130
Implantable Lead Implant Date: 20051130
Implantable Lead Location: 753859
Implantable Lead Location: 753860
Implantable Lead Model: 5076
Implantable Pulse Generator Implant Date: 20140901
Lead Channel Impedance Value: 538 Ohm
Lead Channel Impedance Value: 564 Ohm
Lead Channel Pacing Threshold Amplitude: 0.5 V
Lead Channel Pacing Threshold Amplitude: 1 V
Lead Channel Pacing Threshold Pulse Width: 0.4 ms
Lead Channel Pacing Threshold Pulse Width: 0.4 ms
Lead Channel Setting Pacing Amplitude: 2 V
Lead Channel Setting Pacing Amplitude: 2.5 V
Lead Channel Setting Pacing Pulse Width: 0.46 ms
Lead Channel Setting Sensing Sensitivity: 5.6 mV

## 2021-04-29 NOTE — Progress Notes (Signed)
Remote pacemaker transmission.   

## 2021-05-11 ENCOUNTER — Other Ambulatory Visit: Payer: Self-pay

## 2021-05-11 ENCOUNTER — Encounter: Payer: Self-pay | Admitting: Internal Medicine

## 2021-05-11 MED ORDER — METOPROLOL SUCCINATE ER 50 MG PO TB24: 50.0000 mg | ORAL_TABLET | Freq: Every day | ORAL | 0 refills | Status: DC

## 2021-06-04 ENCOUNTER — Other Ambulatory Visit: Payer: Self-pay | Admitting: Internal Medicine

## 2021-06-04 NOTE — Telephone Encounter (Signed)
Prescription refill request for Eliquis received. Indication: PAF Last office visit: 04/08/21  R Dunn PA-C Scr:  0.9 on 05/07/21 Age:  88 Weight: 57.3kg  Based on above findings Eliquis 2.'5mg'$  twice daily is the appropriate dose.  Refill approved.

## 2021-06-09 ENCOUNTER — Encounter: Payer: Self-pay | Admitting: Internal Medicine

## 2021-06-09 ENCOUNTER — Ambulatory Visit (INDEPENDENT_AMBULATORY_CARE_PROVIDER_SITE_OTHER): Payer: Medicare Other | Admitting: Internal Medicine

## 2021-06-09 VITALS — BP 140/90 | HR 100 | Ht 66.5 in | Wt 121.0 lb

## 2021-06-09 DIAGNOSIS — I10 Essential (primary) hypertension: Secondary | ICD-10-CM

## 2021-06-09 DIAGNOSIS — I5032 Chronic diastolic (congestive) heart failure: Secondary | ICD-10-CM

## 2021-06-09 DIAGNOSIS — R296 Repeated falls: Secondary | ICD-10-CM

## 2021-06-09 DIAGNOSIS — I480 Paroxysmal atrial fibrillation: Secondary | ICD-10-CM | POA: Diagnosis not present

## 2021-06-09 DIAGNOSIS — I495 Sick sinus syndrome: Secondary | ICD-10-CM

## 2021-06-09 DIAGNOSIS — Z950 Presence of cardiac pacemaker: Secondary | ICD-10-CM | POA: Diagnosis not present

## 2021-06-09 DIAGNOSIS — I951 Orthostatic hypotension: Secondary | ICD-10-CM

## 2021-06-09 MED ORDER — AMLODIPINE BESYLATE 2.5 MG PO TABS: 2.5000 mg | ORAL_TABLET | Freq: Every day | ORAL | 3 refills | Status: DC

## 2021-06-09 NOTE — Patient Instructions (Signed)
Medication Instructions:  - Your physician has recommended you make the following change in your medication:   1) START Amlodipine 2.5 mg: - take 1 tablet by mouth once daily    Send a MyChart message with your blood pressure readings in about 1 week It is best to take your morning medications, then wait 1-2 hours and take a blood pressure reading to insure the medication is already in your system  *If you need a refill on your cardiac medications before your next appointment, please call your pharmacy*   Lab Work: - none ordered  If you have labs (blood work) drawn today and your tests are completely normal, you will receive your results only by: [place] (if you have MyChart) OR A paper copy in the mail If you have any lab test that is abnormal or we need to change your treatment, we will call you to review the results.   Testing/Procedures:  1) Head CT (non- contrasted):   Wednesday 06/10/21 12:30 pm (arrive at 12:15 pm)  - Non-Cardiac CT scanning, (CAT scanning), is a noninvasive, special x-ray that produces cross-sectional images of the body using x-rays and a computer. CT scans help physicians diagnose and treat medical conditions. For some CT exams, a contrast material is used to enhance visibility in the area of the body being studied. CT scans provide greater clarity and reveal more details than regular x-ray exams.   Follow-Up: At [facility], you and your health needs are our priority.  As part of our continuing mission to provide you with exceptional heart care, we have created designated Provider Care Teams.  These Care Teams include your primary Cardiologist (physician) and Advanced Practice Providers (APPs -  Physician Assistants and Nurse Practitioners) who all work together to provide you with the care you need, when you need it.  We recommend signing up for the patient portal called "MyChart".  Sign up information is provided on this After Visit Summary.   MyChart is used to connect with patients for Virtual Visits (Telemedicine).  Patients are able to view lab/test results, encounter notes, upcoming appointments, etc.  Non-urgent messages can be sent to your provider as well.   To learn more about what you can do with MyChart, go to [email or web address].    Your next appointment:   3 month(s)  The format for your next appointment:   In Person  Provider:   [name], MD    Other Instructions   1) Consider wearing Thigh Sleeves/ an Abdominal Binder    Amlodipine Tablets What is this medication? AMLODIPINE (am LOE di peen) treats high blood pressure and prevents chest pain (angina). It works by relaxing the blood vessels, which helps decrease the amount of work your heart has to do. It belongs to a group of medications called calcium channel blockers. This medicine may be used for other purposes; ask your health care provider or pharmacist if you have questions. COMMON BRAND NAME(S): Norvasc What should I tell my care team before I take this medication? They need to know if you have any of these conditions: Heart disease Liver disease An unusual or allergic reaction to amlodipine, other medications, foods, dyes, or preservatives Pregnant or trying to get pregnant Breast-feeding How should I use this medication? Take this medication by mouth. Take it as directed on the prescription label at the same time every day. You can take it with or without food. If it upsets your stomach, take it with food. Keep taking it  unless your care team tells you to stop. Talk to your care team about the use of this medication in children. While it may be prescribed for children as young as 6 for selected conditions, precautions do apply. Overdosage: If you think you have taken too much of this medicine contact a poison control center or emergency room at once. NOTE: This medicine is only for you. Do not share this medicine with others. What if I  miss a dose? If you miss a dose, take it as soon as you can. If it is almost time for your next dose, take only that dose. Do not take double or extra doses. What may interact with this medication? Clarithromycin Cyclosporine Diltiazem Itraconazole Simvastatin Tacrolimus This list may not describe all possible interactions. Give your health care provider a list of all the medicines, herbs, non-prescription drugs, or dietary supplements you use. Also tell them if you smoke, drink alcohol, or use illegal drugs. Some items may interact with your medicine. What should I watch for while using this medication? Visit your health care provider for regular checks on your progress. Check your blood pressure as directed. Ask your health care provider what your blood pressure should be. Also, find out when you should contact him or her. Do not treat yourself for coughs, colds, or pain while you are using this medication without asking your health care provider for advice. Some medications may increase your blood pressure. You may get drowsy or dizzy. Do not drive, use machinery, or do anything that needs mental alertness until you know how this medication affects you. Do not stand up or sit up quickly, especially if you are an older patient. This reduces the risk of dizzy or fainting spells. Alcohol can make you more drowsy and dizzy. Avoid alcoholic drinks. What side effects may I notice from receiving this medication? Side effects that you should report to your care team as soon as possible: Allergic reactions--skin rash, itching, hives, swelling of the face, lips, tongue, or throat Heart attack--pain or tightness in the chest, shoulders, arms, or jaw, nausea, shortness of breath, cold or clammy skin, feeling faint or lightheaded Low blood pressure--dizziness, feeling faint or lightheaded, blurry vision Side effects that usually do not require medical attention (report these to your care team if they  continue or are bothersome): Facial flushing, redness Heart palpitations--rapid, pounding, or irregular heartbeat Nausea Stomach pain Swelling of the ankles, hands, or feet This list may not describe all possible side effects. Call your doctor for medical advice about side effects. You may report side effects to FDA at 1-800-FDA-1088. Where should I keep my medication? Keep out of the reach of children and pets. Store at room temperature between 20 and 25 degrees C (68 and 77 degrees F). Protect from light and moisture. Keep the container tightly closed. Get rid of any unused medication after the expiration date. To get rid of medications that are no longer needed or have expired: Take the medication to a medication take-back program. Check with your pharmacy or law enforcement to find a location. If you cannot return the medication, check the label or package insert to see if the medication should be thrown out in the garbage or flushed down the toilet. If you are not sure, ask your health care provider. If it is safe to put in the trash, empty the medication out of the container. Mix the medication with cat litter, dirt, coffee grounds, or other unwanted substance. Seal the mixture in  a bag or container. Put it in the trash. NOTE: This sheet is a summary. It may not cover all possible information. If you have questions about this medicine, talk to your doctor, pharmacist, or health care provider.  2023 Elsevier/Gold Standard (2020-12-05 00:00:00)   Important Information About Sugar

## 2021-06-09 NOTE — Progress Notes (Signed)
Patient Care Team: Kirk Ruths, MD as PCP - General (Unknown Physician Specialty) Deboraha Sprang, MD as PCP - Cardiology (Cardiology) Deboraha Sprang, MD as PCP - Electrophysiology (Cardiology)   HPI  Alejandra Wiley is a 88 y.o. female seen in followup for palpitations associated with atrial arrhythmias as well as bradycardia requiring backup bradycardia pacing. She previously took flecainide.  Resulted in prolonged AV nodal conduction and ventricular pacing.  Deterioration of LV function; flecainide discontinued with subsequent normalization of LV function.    History of significant orthostasis in the past  1/20 Hospitalized with stroke- R sided weakness.  INR subtherapeutic 1.52.  >>imaging  CT  No acute stroke.  Carotids neg.  Coumadin d/c and apixoban started ( 2.5 mg bid -wt 55 kg;age->82)  4/22 hospitalized with weakness and found to have Afib RVR>>> prompting increase of her nadolol.  Further uptitration to 60 bid, still with some rapid rates ( (See Below)    12/22 admitted to the hospital again with atrial fibrillation and rapid rate and acute congestive heart failure She was started on amiodarone and her nadolol was discontinued.  Short acting metoprolol was added.  She is tolerating these medications well without intolerances.  She has had 2 interval falls.  Both without warning.  Both associated with significant trauma the first resulting in shoulder surgery the second with head trauma  Some lightheadedness with standing.  No shower intolerance.   Date VP%  3/14 1  3/*16 95  10/17 95  10/18 92-DDD-DDI  6/19 68  10/19 84  4/20  3.3  7/21 9      DATE TEST EF Vp %    /13 Echo  55-65%    8/19 Echo  50-55% 84%  1/20 Echo   30-35%   11/21 Echo  55-60   5/22 Echo  55-55%   12/22 Echo  45% GLOBAL   12/22 Myoview <30%       Date Cr K Hgb TSH LFT  8/18 0.67  12.2    9/18 0.64  10    1/20 0.95  11.8    6/20 0.9  11.7    5/22 0.77  11.3     10/22 0.8 4.4  4.84   12/22 0.8 3.7 12.5 4.062 25  3/23 0.85 4.5 8.3 2.04           Thromboembolic risk factors ( age  -2, HTN-1, TIA/CVA-2, DM-1, CHF-1, Gender-1) for a CHADSVASc Score of >=8    Past Medical History:  Diagnosis Date   Anxiety    Aortic atherosclerosis (HCC)    Arthritis    Bradycardia    Breast fibroadenoma, left 05/12/2001   a.) CNB (+) moderate epithelial hyperplasia without atypia and fibrocystic changes   CHF (congestive heart failure) (HCC)    Complication of anesthesia    heart stopped at the end of surgery with Dr. Rudene Christians during a back surgery in approx 2019.   Hypertension    Long term current use of anticoagulant    a.) dose reduced apixaban   MRSA nasal colonization 09/17/2016   Pacemaker 12/06/2003   Medtronic Impulse DTDR01   Pacemaker 2015   PAF (paroxysmal atrial fibrillation) (HCC)    a.) CHA2DS2-VASc = 8 (age x 2, sex, CHF, HTN, CVA x 2, aortic plaque). b.) rate/rhythm maintained on oral amiodarone + metoprolol succinate; chronically anticoagulated with dose reduced apixaban   Sick sinus syndrome (HCC)    Sinoatrial node dysfunction (  [place])    Stroke [facility])    Vaginal intraepithelial neoplasia    VAIN (vaginal intraepithelial neoplasia)     Past Surgical History:  Procedure Laterality Date   APPENDECTOMY     BICEPT TENODESIS Right 04/09/2021   Procedure: BICEPS TENODESIS;  Surgeon: Leim Fabry, MD;  Location: [facility] ORS;  Service: Orthopedics;  Laterality: Right;   BREAST BIOPSY Left 05/12/2001   moderate epithelial hyperplasia without atypia and fibrocystic changes   COLONOSCOPY     HERNIA REPAIR     lower abd   IMAGE GUIDED SINUS SURGERY  2015   INSERT / REPLACE / REMOVE PACEMAKER  12/06/2003   Medtronic impulse DTDR01   JOINT REPLACEMENT Right    knee   KYPHOPLASTY N/A 06/23/2017   Procedure: [id number];  Surgeon: Hessie Knows, MD;  Location: [facility] ORS;  Service: Orthopedics;  Laterality: N/A;   LEEP  1999   ORIF HIP FRACTURE  Right 02/2016   PACEMAKER GENERATOR CHANGE N/A 09/18/2012   Procedure: PACEMAKER GENERATOR CHANGE;  Surgeon: Deboraha Sprang, MD;  Location: [facility] CATH LAB;  Service: Cardiovascular;  Laterality: N/A;   REVERSE SHOULDER ARTHROPLASTY Right 04/09/2021   Procedure: Right reverse shoulder arthroplasty for fracture;  Surgeon: Leim Fabry, MD;  Location: [facility] ORS;  Service: Orthopedics;  Laterality: Right;   TONSILLECTOMY     TOTAL KNEE ARTHROPLASTY     TOTAL KNEE REVISION Right 09/23/2016   Procedure: TOTAL KNEE REVISION-POLYETHYLENE EXCHANGE;  Surgeon: Hessie Knows, MD;  Location: [facility] ORS;  Service: Orthopedics;  Laterality: Right;   VAGINAL HYSTERECTOMY      Current Outpatient Medications  Medication Sig Dispense Refill   acetaminophen (TYLENOL) 500 MG tablet Take 500 mg by mouth every 8 (eight) hours as needed for mild pain or headache.     amiodarone (PACERONE) 200 MG tablet Take 1 tablet (200 mg total) by mouth daily. 90 tablet 1   apixaban (ELIQUIS) 2.5 MG TABS tablet TAKE 1 TABLET TWICE A DAY 180 tablet 1   fluticasone (FLONASE) 50 MCG/ACT nasal spray Place 2 sprays into both nostrils daily as needed for allergies or rhinitis.     furosemide (LASIX) 40 MG tablet Take 40 mg by mouth daily as needed. Takes most days unless she is not going to be home that day     ibandronate (BONIVA) 150 MG tablet Take 150 mg by mouth every 30 (thirty) days. Take in the morning with a full glass of water, on an empty stomach, and do not take anything else by mouth or lie down for the next 30 min.     lidocaine (LIDODERM) 5 % Place 1 patch onto the skin every 12 (twelve) hours. Remove & Discard patch within 12 hours or as directed by MD 10 patch 0   metoprolol succinate (TOPROL-XL) 50 MG 24 hr tablet Take 1 tablet (50 mg total) by mouth daily. TAKE WITH OR IMMEDIATELY FOLLOWING A MEAL. 90 tablet 0   Multiple Vitamins-Minerals (SENIOR MULTIVITAMIN PLUS PO) Take 1 tablet by mouth daily.     Multiple  Vitamins-Minerals (ZINC PO) Take by mouth as needed.     oxyCODONE (ROXICODONE) 5 MG immediate release tablet Take 1 tablet (5 mg total) by mouth every 8 (eight) hours as needed. 20 tablet 0   potassium chloride SA (KLOR-CON M) 20 MEQ tablet Take 20 mEq by mouth as needed. Takes for leg cramps     No current facility-administered medications for this visit.    Allergies  Allergen Reactions   Latex  Other (See Comments)    blisters   Other Other (See Comments)    Blisters/ paper tape is Market researcher paper tape is Market researcher paper tape is Clinical research associate paper tape is Ok   Adult nurse (See Comments)    Blisters/ paper tape is Market researcher paper tape is Ok   Xarelto [Rivaroxaban] Other (See Comments)    Gums bleeding and too much bruising   Nickel Rash and Other (See Comments)    Review of Systems negative except from HPI and PMH BP 140/90 (BP Location: Left Arm, Patient Position: Sitting, Cuff Size: Normal)   Pulse 100   Ht 5' 6.5" (1.689 m)   Wt 121 lb (54.9 kg)   SpO2 97%   BMI 19.24 kg/m  Well developed and well nourished in no acute distress HENT bandages over her eyes and cheek from a recent trauma Neck supple with JVP-flat Clear Device pocket well healed; without hematoma or erythema.  There is no tethering  Regular rate and rhythm, no  gallop No murmur Abd-soft with active BS No Clubbing cyanosis  edema Skin-warm and dry A & Oriented  Grossly normal sensory and motor function  ECG atrial tachycardia 160 with 2: 1 conduction  Assessment and  Plan  Atrial arrhythmia/Fib (5.9%) with some RVR   Sinus node dysfunction  Hypertension   Syncope  High Risk Medication Surveillance amiodarone  Pacemaker  Medtronic  Dual chamber   Elevated pacing thresholds A Greater    HFpEF>> LV dysfunction resolved and now recurring   Orthostatic hypotension  1 AVB-profound  Anemia  Falls   She has had 2 recurrent falls without warning.  I suspected that we would  find orthostatic intolerance, but did not.  Her device does not show atrial arrhythmias with the first event she is been out of rhythm for some weeks so temporally unrelated to the more recent event.  With her antecedent history of orthostatic hypotension, I suspect that this is likely the culprit not withstanding the fact that it was not demonstrated today.  Given her very significantly elevated blood pressure, we will add low-dose amlodipine but would not add pharmacotherapy for her orthostatic hypotension interestingly but will try compression and have reviewed the physiology of thigh and abdominal counterpressure.  We will need to recheck her hemoglobin, suspect that her hemoglobin was low following her surgery, we will need to see how it has recovered  Intercurrent atrial arrhythmias, not clearly symptomatic and not going to hold without antiarrhythmic therapy so we will hold steady on the course.  On blood thinners.  We will need to check a head CT following head trauma.  For now we will continue the apixaban 2.5 twice daily  Blood pressure is borderline elevated; however, I suspect that the orthostatic hypotension is problematic, but Orthostatic BP were normal

## 2021-06-10 ENCOUNTER — Ambulatory Visit
Admission: RE | Admit: 2021-06-10 | Discharge: 2021-06-10 | Disposition: A | Discharge status: Home / Self Care | Payer: Medicare Other | Source: Ambulatory Visit | Attending: Internal Medicine | Admitting: Internal Medicine

## 2021-06-10 DIAGNOSIS — R296 Repeated falls: Secondary | ICD-10-CM | POA: Insufficient documentation

## 2021-06-11 ENCOUNTER — Ambulatory Visit: Payer: Medicare Other | Admitting: Internal Medicine

## 2021-06-15 ENCOUNTER — Encounter: Payer: Self-pay | Admitting: Internal Medicine

## 2021-06-16 ENCOUNTER — Encounter: Payer: Medicare HMO | Admitting: Internal Medicine

## 2021-06-29 ENCOUNTER — Other Ambulatory Visit: Payer: Self-pay

## 2021-06-29 MED ORDER — AMLODIPINE BESYLATE 2.5 MG PO TABS: 2.5000 mg | ORAL_TABLET | Freq: Every day | ORAL | 0 refills | Status: DC

## 2021-07-06 ENCOUNTER — Other Ambulatory Visit: Payer: Self-pay | Admitting: *Deleted

## 2021-07-06 MED ORDER — AMIODARONE HCL 200 MG PO TABS: 200.0000 mg | ORAL_TABLET | Freq: Every day | ORAL | 0 refills | Status: DC

## 2021-07-16 ENCOUNTER — Ambulatory Visit (INDEPENDENT_AMBULATORY_CARE_PROVIDER_SITE_OTHER): Payer: Medicare Other

## 2021-07-16 ENCOUNTER — Telehealth: Payer: Self-pay | Admitting: Internal Medicine

## 2021-07-16 DIAGNOSIS — I495 Sick sinus syndrome: Secondary | ICD-10-CM

## 2021-07-16 LAB — CUP PACEART REMOTE DEVICE CHECK
Battery Impedance: 2023 Ohm
Battery Remaining Longevity: 34 mo
Battery Voltage: 2.74 V
Brady Statistic AP VP Percent: 4 %
Brady Statistic AP VS Percent: 85 %
Brady Statistic AS VP Percent: 1 %
Brady Statistic AS VS Percent: 10 %
Date Time Interrogation Session: 20230629085449
Implantable Lead Implant Date: 20051130
Implantable Lead Implant Date: 20051130
Implantable Lead Location: 753859
Implantable Lead Location: 753860
Implantable Lead Model: 5076
Implantable Pulse Generator Implant Date: 20140901
Lead Channel Impedance Value: 531 Ohm
Lead Channel Impedance Value: 574 Ohm
Lead Channel Pacing Threshold Amplitude: 0.375 V
Lead Channel Pacing Threshold Amplitude: 1 V
Lead Channel Pacing Threshold Pulse Width: 0.4 ms
Lead Channel Pacing Threshold Pulse Width: 0.4 ms
Lead Channel Setting Pacing Amplitude: 2 V
Lead Channel Setting Pacing Amplitude: 2.5 V
Lead Channel Setting Pacing Pulse Width: 0.46 ms
Lead Channel Setting Sensing Sensitivity: 4 mV

## 2021-07-16 MED ORDER — AMIODARONE HCL 200 MG PO TABS: 200.0000 mg | ORAL_TABLET | Freq: Every day | ORAL | 0 refills | Status: DC

## 2021-07-16 NOTE — Telephone Encounter (Signed)
  Requested Prescriptions   Signed Prescriptions Disp Refills   amiodarone (PACERONE) 200 MG tablet 30 tablet 0    Sig: Take 1 tablet (200 mg total) by mouth daily.    Authorizing Provider: Deboraha Sprang    Ordering User: Chelsa Stout C   90 day refill sent as well to [facility].

## 2021-07-16 NOTE — Telephone Encounter (Signed)
Pt c/o medication issue:  1. Name of Medication: amiodarone (PACERONE) 200 MG tablet  2. How are you currently taking this medication (dosage and times per day)? Take 1 tablet (200 mg total) by mouth daily.  3. Are you having a reaction (difficulty breathing--STAT)? No   4. What is your medication issue? Patient has 2 days left of medication with no refills. Please send in a 30 day subscription to  [place] and [address] to [place], [place]

## 2021-07-20 ENCOUNTER — Encounter: Payer: Self-pay | Admitting: Internal Medicine

## 2021-07-28 ENCOUNTER — Encounter: Payer: Self-pay | Admitting: Internal Medicine

## 2021-08-06 NOTE — Progress Notes (Signed)
Remote pacemaker transmission.   

## 2021-08-10 ENCOUNTER — Other Ambulatory Visit: Payer: Self-pay | Admitting: Internal Medicine

## 2021-09-15 ENCOUNTER — Other Ambulatory Visit
Admission: RE | Admit: 2021-09-15 | Discharge: 2021-09-15 | Disposition: A | Discharge status: Home / Self Care | Payer: Medicare Other | Attending: Internal Medicine | Admitting: Internal Medicine

## 2021-09-15 ENCOUNTER — Ambulatory Visit: Payer: Medicare Other | Attending: Internal Medicine | Admitting: Internal Medicine

## 2021-09-15 ENCOUNTER — Encounter: Payer: Self-pay | Admitting: Internal Medicine

## 2021-09-15 VITALS — BP 130/76 | HR 76 | Ht 66.5 in | Wt 123.0 lb

## 2021-09-15 DIAGNOSIS — I5032 Chronic diastolic (congestive) heart failure: Secondary | ICD-10-CM | POA: Insufficient documentation

## 2021-09-15 DIAGNOSIS — I10 Essential (primary) hypertension: Secondary | ICD-10-CM | POA: Diagnosis present

## 2021-09-15 DIAGNOSIS — Z950 Presence of cardiac pacemaker: Secondary | ICD-10-CM | POA: Diagnosis not present

## 2021-09-15 DIAGNOSIS — I951 Orthostatic hypotension: Secondary | ICD-10-CM | POA: Diagnosis present

## 2021-09-15 DIAGNOSIS — I495 Sick sinus syndrome: Secondary | ICD-10-CM | POA: Insufficient documentation

## 2021-09-15 DIAGNOSIS — I480 Paroxysmal atrial fibrillation: Secondary | ICD-10-CM | POA: Insufficient documentation

## 2021-09-15 DIAGNOSIS — Z79899 Other long term (current) drug therapy: Secondary | ICD-10-CM | POA: Insufficient documentation

## 2021-09-15 LAB — CBC
HCT: 37.9 % (ref 36.0–46.0)
Hemoglobin: 11.8 g/dL — ABNORMAL LOW (ref 12.0–15.0)
MCH: 30.3 pg (ref 26.0–34.0)
MCHC: 31.1 g/dL (ref 30.0–36.0)
MCV: 97.4 fL (ref 80.0–100.0)
Platelets: 228 10*3/uL (ref 150–400)
RBC: 3.89 MIL/uL (ref 3.87–5.11)
RDW: 16.5 % — ABNORMAL HIGH (ref 11.5–15.5)
WBC: 6.5 10*3/uL (ref 4.0–10.5)
nRBC: 0 % (ref 0.0–0.2)

## 2021-09-15 LAB — COMPREHENSIVE METABOLIC PANEL
ALT: 29 U/L (ref 0–44)
AST: 31 U/L (ref 15–41)
Albumin: 4.1 g/dL (ref 3.5–5.0)
Alkaline Phosphatase: 54 U/L (ref 38–126)
Anion gap: 6 (ref 5–15)
BUN: 28 mg/dL — ABNORMAL HIGH (ref 8–23)
CO2: 26 mmol/L (ref 22–32)
Calcium: 9.4 mg/dL (ref 8.9–10.3)
Chloride: 106 mmol/L (ref 98–111)
Creatinine, Ser: 1 mg/dL (ref 0.44–1.00)
GFR, Estimated: 55 mL/min — ABNORMAL LOW (ref >60)
Glucose, Bld: 70 mg/dL (ref 70–99)
Potassium: 4.1 mmol/L (ref 3.5–5.1)
Sodium: 138 mmol/L (ref 135–145)
Total Bilirubin: 0.7 mg/dL (ref 0.3–1.2)
Total Protein: 7.6 g/dL (ref 6.5–8.1)

## 2021-09-15 LAB — TSH: TSH: 2.554 u[IU]/mL (ref 0.350–4.500)

## 2021-09-15 NOTE — Progress Notes (Signed)
Patient Care Team: Kirk Ruths, MD as PCP - General (Unknown Physician Specialty) Deboraha Sprang, MD as PCP - Cardiology (Cardiology) Deboraha Sprang, MD as PCP - Electrophysiology (Cardiology)   HPI  Alejandra Wiley is a 88 y.o. female seen in followup for palpitations associated with atrial arrhythmias as well as bradycardia requiring backup bradycardia pacing. She previously took flecainide.  Resulted in prolonged AV nodal conduction and ventricular pacing.  Deterioration of LV function; flecainide discontinued with subsequent normalization of LV function.  Ended up on amiodarone 12/22 following hospitalization with AFib RVF and acute CHF   Hypertension and orthostasic LH and recurrent falls but without demonstrable orthostatic hypotension  1/20 Hospitalized with stroke- R sided weakness.  INR subtherapeutic 1.52.  >>imaging  CT  No acute stroke.  Carotids neg.  Coumadin d/c and apixoban started ( 2.5 mg bid -wt 55 kg;age->82)  The patient denies chest pain, shortness of breath, nocturnal dyspnea, orthopnea or peripheral edema.  There have been no palpitations, lightheadedness or syncope.    .   Date VP%  3/14 1  3/*16 95  10/17 95  10/18 92-DDD-DDI  6/19 68  10/19 84  4/20  3.3  7/21 9    Patient denies symptoms of respiratory, GI intolerance, sun sensitivity, neurological symptoms attributable to amiodarone.       DATE TEST EF Vp %    /13 Echo  55-65%    8/19 Echo  50-55% 84%  1/20 Echo   30-35%   11/21 Echo  55-60   5/22 Echo  55-55%   12/22 Echo  45% GLOBAL   12/22 Myoview <30%       Date Cr K Hgb TSH LFT  8/18 0.67  12.2    9/18 0.64  10    1/20 0.95  11.8    6/20 0.9  11.7    5/22 0.77  11.3    10/22 0.8 4.4  4.84   12/22 0.8 3.7 12.5 4.062 25  3/23 0.85 4.5 8.3 2.04           Thromboembolic risk factors ( age  -2, HTN-1, TIA/CVA-2, DM-1, CHF-1, Gender-1) for a CHADSVASc Score of >=8    Past Medical History:  Diagnosis Date    Anxiety    Aortic atherosclerosis (HCC)    Arthritis    Bradycardia    Breast fibroadenoma, left 05/12/2001   a.) CNB (+) moderate epithelial hyperplasia without atypia and fibrocystic changes   CHF (congestive heart failure) ([place])    Complication of anesthesia    heart stopped at the end of surgery with Dr. Rudene Christians during a back surgery in approx 2019.   Hypertension    Long term current use of anticoagulant    a.) dose reduced apixaban   MRSA nasal colonization 09/17/2016   Pacemaker 12/06/2003   Medtronic Impulse DTDR01   Pacemaker 2015   PAF (paroxysmal atrial fibrillation) (HCC)    a.) CHA2DS2-VASc = 8 (age x 2, sex, CHF, HTN, CVA x 2, aortic plaque). b.) rate/rhythm maintained on oral amiodarone + metoprolol succinate; chronically anticoagulated with dose reduced apixaban   Sick sinus syndrome (HCC)    Sinoatrial node dysfunction (HCC)    Stroke [facility])    Vaginal intraepithelial neoplasia    VAIN (vaginal intraepithelial neoplasia)     Past Surgical History:  Procedure Laterality Date   APPENDECTOMY     BICEPT TENODESIS Right 04/09/2021   Procedure: BICEPS TENODESIS;  Surgeon:  Leim Fabry, MD;  Location: [facility] ORS;  Service: Orthopedics;  Laterality: Right;   BREAST BIOPSY Left 05/12/2001   moderate epithelial hyperplasia without atypia and fibrocystic changes   COLONOSCOPY     HERNIA REPAIR     lower abd   IMAGE GUIDED SINUS SURGERY  2015   INSERT / REPLACE / REMOVE PACEMAKER  12/06/2003   Medtronic impulse DTDR01   JOINT REPLACEMENT Right    knee   KYPHOPLASTY N/A 06/23/2017   Procedure: [id number];  Surgeon: Hessie Knows, MD;  Location: [facility] ORS;  Service: Orthopedics;  Laterality: N/A;   LEEP  1999   ORIF HIP FRACTURE Right 02/2016   PACEMAKER GENERATOR CHANGE N/A 09/18/2012   Procedure: PACEMAKER GENERATOR CHANGE;  Surgeon: Deboraha Sprang, MD;  Location: [facility] CATH LAB;  Service: Cardiovascular;  Laterality: N/A;   REVERSE SHOULDER ARTHROPLASTY Right 04/09/2021    Procedure: Right reverse shoulder arthroplasty for fracture;  Surgeon: Leim Fabry, MD;  Location: [facility] ORS;  Service: Orthopedics;  Laterality: Right;   TONSILLECTOMY     TOTAL KNEE ARTHROPLASTY     TOTAL KNEE REVISION Right 09/23/2016   Procedure: TOTAL KNEE REVISION-POLYETHYLENE EXCHANGE;  Surgeon: Hessie Knows, MD;  Location: [facility] ORS;  Service: Orthopedics;  Laterality: Right;   VAGINAL HYSTERECTOMY      Current Outpatient Medications  Medication Sig Dispense Refill   acetaminophen (TYLENOL) 500 MG tablet Take 500 mg by mouth every 8 (eight) hours as needed for mild pain or headache.     amiodarone (PACERONE) 200 MG tablet Take 1 tablet (200 mg total) by mouth daily. 30 tablet 0   amLODipine (NORVASC) 2.5 MG tablet Take 1 tablet (2.5 mg total) by mouth daily. 90 tablet 0   apixaban (ELIQUIS) 2.5 MG TABS tablet TAKE 1 TABLET TWICE A DAY 180 tablet 1   fluticasone (FLONASE) 50 MCG/ACT nasal spray Place 2 sprays into both nostrils daily as needed for allergies or rhinitis.     furosemide (LASIX) 40 MG tablet Take 40 mg by mouth daily as needed. Takes most days unless she is not going to be home that day     ibandronate (BONIVA) 150 MG tablet Take 150 mg by mouth every 30 (thirty) days. Take in the morning with a full glass of water, on an empty stomach, and do not take anything else by mouth or lie down for the next 30 min.     lidocaine (LIDODERM) 5 % Place 1 patch onto the skin every 12 (twelve) hours. Remove & Discard patch within 12 hours or as directed by MD 10 patch 0   metoprolol succinate (TOPROL-XL) 50 MG 24 hr tablet TAKE 1 TABLET DAILY WITH OR IMMEDIATELY FOLLOWING A MEAL 90 tablet 3   Multiple Vitamins-Minerals (SENIOR MULTIVITAMIN PLUS PO) Take 1 tablet by mouth daily.     oxyCODONE (ROXICODONE) 5 MG immediate release tablet Take 1 tablet (5 mg total) by mouth every 8 (eight) hours as needed. 20 tablet 0   No current facility-administered medications for this visit.     Allergies  Allergen Reactions   Latex Other (See Comments)    blisters   Other Other (See Comments)    Blisters/ paper tape is Market researcher paper tape is Market researcher paper tape is Clinical research associate paper tape is Ok   Tape Other (See Comments)    Blisters/ paper tape is Market researcher paper tape is Ok   Xarelto [Rivaroxaban] Other (See Comments)    Gums bleeding and too much  bruising   Nickel Rash and Other (See Comments)    Review of Systems negative except from HPI and PMH BP 130/76 (BP Location: Left Arm, Patient Position: Sitting, Cuff Size: Normal)   Pulse 76   Ht 5' 6.5" (1.689 m)   Wt 123 lb (55.8 kg)   SpO2 97%   BMI 19.56 kg/m  Well developed and well nourished in no acute distress HENT normal Neck supple with JVP-flat Clear Device pocket well healed; without hematoma or erythema.  There is no tethering  Regular rate and rhythm, no murmur Abd-soft with active BS No Clubbing cyanosis  edema Skin-warm and dry A & Oriented  Grossly normal sensory and motor function  ECG atrial pacing at 76 37/14/44 Left bundle branch block  Assessment and  Plan  Atrial arrhythmia/Fib (5.9%) with some RVR   Sinus node dysfunction  Hypertension   Syncope  High Risk Medication Surveillance amiodarone  Pacemaker  Medtronic  Dual chamber   Elevated pacing thresholds A Greater    HFpEF>> LV dysfunction resolved and now recurring   Orthostatic hypotension  1 AVB-profound  Anemia   Doing exceedingly well, no lightheadedness.  Blood pressure much improved.  We will continue amlodipine 2.5, metoprolol 60. No interval atrial fibrillation, continue amiodarone at 200 mg a day and Eliquis 2.5 mg twice daily.  Last hemoglobin was low and we will recheck her CBC today.  Needs amiodarone surveillance laboratories.  With her cardiomyopathy we will reassess left ventricular function.  It may well be appropriate to discontinue the amlodipine and use an ACE/ARB/ASNI/MRA    Orthostasis not symptomatic at this point and no interval falls

## 2021-09-15 NOTE — Patient Instructions (Addendum)
Medication Instructions:  - Your physician recommends that you continue on your current medications as directed. Please refer to the Current Medication list given to you today.  *If you need a refill on your cardiac medications before your next appointment, please call your pharmacy*   Lab Work: - Your physician recommends that you have lab work today:   CMET/ TSH/ CBC  Nature conservation officer at [facility] 1st desk on the right to check in (REGISTRATION)  Lab hours: Monday- Friday (7:30 am- 5:30 pm)   If you have labs (blood work) drawn today and your tests are completely normal, you will receive your results only by: MyChart Message (if you have MyChart) OR A paper copy in the mail If you have any lab test that is abnormal or we need to change your treatment, we will call you to review the results.   Testing/Procedures: - Your physician has requested that you have an echocardiogram. Echocardiography is a painless test that uses sound waves to create images of your heart. It provides your doctor with information about the size and shape of your heart and how well your heart's chambers and valves are working. This procedure takes approximately one hour. There are no restrictions for this procedure. There is a possibility that an IV may need to be started during your test to inject an image enhancing agent. This is done to obtain more optimal pictures of your heart. Therefore we ask that you do at least drink some water prior to coming in to hydrate your veins.     Follow-Up: At [facility], you and your health needs are our priority.  As part of our continuing mission to provide you with exceptional heart care, we have created designated Provider Care Teams.  These Care Teams include your primary Cardiologist (physician) and Advanced Practice Providers (APPs -  Physician Assistants and Nurse Practitioners) who all work together to provide you with the care you need, when you need  it.  We recommend signing up for the patient portal called "MyChart".  Sign up information is provided on this After Visit Summary.  MyChart is used to connect with patients for Virtual Visits (Telemedicine).  Patients are able to view lab/test results, encounter notes, upcoming appointments, etc.  Non-urgent messages can be sent to your provider as well.   To learn more about what you can do with MyChart, go to [email or web address].    Your next appointment:   6 month(s)  The format for your next appointment:   In Person  Provider:   [name], MD    Other Instructions N/a  Important Information About Sugar

## 2021-09-20 ENCOUNTER — Other Ambulatory Visit: Payer: Self-pay | Admitting: Internal Medicine

## 2021-10-15 ENCOUNTER — Ambulatory Visit: Payer: Medicare Other | Attending: Internal Medicine

## 2021-10-15 ENCOUNTER — Ambulatory Visit (INDEPENDENT_AMBULATORY_CARE_PROVIDER_SITE_OTHER): Payer: Medicare Other

## 2021-10-15 DIAGNOSIS — I5032 Chronic diastolic (congestive) heart failure: Secondary | ICD-10-CM | POA: Diagnosis not present

## 2021-10-15 DIAGNOSIS — I110 Hypertensive heart disease with heart failure: Secondary | ICD-10-CM | POA: Insufficient documentation

## 2021-10-15 DIAGNOSIS — I081 Rheumatic disorders of both mitral and tricuspid valves: Secondary | ICD-10-CM | POA: Diagnosis not present

## 2021-10-15 DIAGNOSIS — Z950 Presence of cardiac pacemaker: Secondary | ICD-10-CM | POA: Insufficient documentation

## 2021-10-15 DIAGNOSIS — I495 Sick sinus syndrome: Secondary | ICD-10-CM

## 2021-10-15 DIAGNOSIS — Z8673 Personal history of transient ischemic attack (TIA), and cerebral infarction without residual deficits: Secondary | ICD-10-CM | POA: Insufficient documentation

## 2021-10-15 DIAGNOSIS — I480 Paroxysmal atrial fibrillation: Secondary | ICD-10-CM | POA: Insufficient documentation

## 2021-10-15 LAB — ECHOCARDIOGRAM COMPLETE
AR max vel: 2 cm2
AV Area VTI: 2.15 cm2
AV Area mean vel: 1.68 cm2
AV Mean grad: 2 mmHg
AV Peak grad: 3.5 mmHg
Ao pk vel: 0.94 m/s
Area-P 1/2: 4.86 cm2
Calc EF: 65.4 %
MV M vel: 5.62 m/s
MV Peak grad: 126.3 mmHg
S' Lateral: 2.45 cm
Single Plane A2C EF: 64.8 %
Single Plane A4C EF: 66.3 %

## 2021-10-16 LAB — CUP PACEART REMOTE DEVICE CHECK
Battery Impedance: 2050 Ohm
Battery Remaining Longevity: 32 mo
Battery Voltage: 2.74 V
Brady Statistic AP VP Percent: 12 %
Brady Statistic AP VS Percent: 79 %
Brady Statistic AS VP Percent: 3 %
Brady Statistic AS VS Percent: 6 %
Date Time Interrogation Session: 20230928162313
Implantable Lead Implant Date: 20051130
Implantable Lead Implant Date: 20051130
Implantable Lead Location: 753859
Implantable Lead Location: 753860
Implantable Lead Model: 5076
Implantable Pulse Generator Implant Date: 20140901
Lead Channel Impedance Value: 486 Ohm
Lead Channel Impedance Value: 566 Ohm
Lead Channel Pacing Threshold Amplitude: 0.375 V
Lead Channel Pacing Threshold Amplitude: 1 V
Lead Channel Pacing Threshold Pulse Width: 0.4 ms
Lead Channel Pacing Threshold Pulse Width: 0.4 ms
Lead Channel Setting Pacing Amplitude: 2 V
Lead Channel Setting Pacing Amplitude: 2.5 V
Lead Channel Setting Pacing Pulse Width: 0.46 ms
Lead Channel Setting Sensing Sensitivity: 4 mV

## 2021-10-21 NOTE — Progress Notes (Signed)
Remote pacemaker transmission.   

## 2021-10-22 ENCOUNTER — Ambulatory Visit: Payer: Medicare Other | Admitting: Dermatology

## 2021-12-01 ENCOUNTER — Other Ambulatory Visit: Payer: Self-pay | Admitting: Internal Medicine

## 2021-12-01 NOTE — Telephone Encounter (Signed)
Prescription refill request for Eliquis received. Indication:afib Last office visit:8/23 Scr:1.0 Age: 88 Weight:55.8  kg  Prescription refilled

## 2021-12-30 ENCOUNTER — Other Ambulatory Visit: Payer: Self-pay | Admitting: Internal Medicine

## 2022-01-04 ENCOUNTER — Ambulatory Visit (INDEPENDENT_AMBULATORY_CARE_PROVIDER_SITE_OTHER): Payer: Medicare Other | Admitting: Dermatology

## 2022-01-04 VITALS — BP 129/76 | HR 72

## 2022-01-04 DIAGNOSIS — L578 Other skin changes due to chronic exposure to nonionizing radiation: Secondary | ICD-10-CM

## 2022-01-04 DIAGNOSIS — D229 Melanocytic nevi, unspecified: Secondary | ICD-10-CM

## 2022-01-04 DIAGNOSIS — L821 Other seborrheic keratosis: Secondary | ICD-10-CM

## 2022-01-04 DIAGNOSIS — I781 Nevus, non-neoplastic: Secondary | ICD-10-CM | POA: Diagnosis not present

## 2022-01-04 DIAGNOSIS — D2339 Other benign neoplasm of skin of other parts of face: Secondary | ICD-10-CM | POA: Diagnosis not present

## 2022-01-04 DIAGNOSIS — L820 Inflamed seborrheic keratosis: Secondary | ICD-10-CM | POA: Diagnosis not present

## 2022-01-04 DIAGNOSIS — Z7189 Other specified counseling: Secondary | ICD-10-CM

## 2022-01-04 DIAGNOSIS — L814 Other melanin hyperpigmentation: Secondary | ICD-10-CM

## 2022-01-04 DIAGNOSIS — Z1283 Encounter for screening for malignant neoplasm of skin: Secondary | ICD-10-CM

## 2022-01-04 NOTE — Progress Notes (Signed)
Follow-Up Visit   Subjective  Alejandra Wiley is a 88 y.o. female who presents for the following: Annual Exam. Hx of Seborrheic keratosis.  The patient presents for Total-Body Skin Exam (TBSE) for skin cancer screening and mole check.  The patient has spots, moles and lesions to be evaluated, some may be new or changing and the patient has concerns that these could be cancer.   The following portions of the chart were reviewed this encounter and updated as appropriate:   Tobacco  Allergies  Meds  Problems  Med Hx  Surg Hx  Fam Hx     Review of Systems:  No other skin or systemic complaints except as noted in HPI or Assessment and Plan.  Objective  Well appearing patient in no apparent distress; mood and affect are within normal limits.  A full examination was performed including scalp, head, eyes, ears, nose, lips, neck, chest, axillae, abdomen, back, buttocks, bilateral upper extremities, bilateral lower extremities, hands, feet, fingers, toes, fingernails, and toenails. All findings within normal limits unless otherwise noted below.  right dorsum foot, Fine matted telangectasia   right mid dorsum nose Dilated pore   left cheek, left arm (2) Stuck-on, waxy, tan-brown papules -- Discussed benign etiology and prognosis.    Assessment & Plan  Telangiectasia right dorsum foot, Benign-appearing.  Observation.  Call clinic for new or changing lesions.  Recommend daily use of broad spectrum spf 30+ sunscreen to sun-exposed areas.   Discussed the treatment option of BBL/laser.  Typically we recommend 1-3 treatment sessions about 5-8 weeks apart for best results.  The patient's condition may require "maintenance treatments" in the future.  The fee for BBL / laser treatments is $350 per treatment session for the whole face.  A fee can be quoted for other parts of the body. Insurance typically does not pay for BBL/laser treatments and therefore the fee is an out-of-pocket  cost.  Dilated pore of [name] of nose right mid dorsum nose Benign-appearing. Exam most consistent with an epidermal inclusion cyst/dilated pore. Discussed that a cyst is a benign growth that can grow over time and sometimes get irritated or inflamed. Recommend observation if it is not bothersome. Discussed option of surgical excision to remove it if it is growing, symptomatic, or other changes noted. Please call for new or changing lesions so they can be evaluated.  Inflamed seborrheic keratosis (2) left cheek, left arm Symptomatic, irritating, patient would like treated.  Destruction of lesion - left cheek, left arm Complexity: simple   Destruction method: cryotherapy   Informed consent: discussed and consent obtained   Timeout:  patient name, date of birth, surgical site, and procedure verified Lesion destroyed using liquid nitrogen: Yes   Region frozen until ice ball extended beyond lesion: Yes   Outcome: patient tolerated procedure well with no complications   Post-procedure details: wound care instructions given    Lentigines - Scattered tan macules - Due to sun exposure - Benign-appearing, observe - Recommend daily broad spectrum sunscreen SPF 30+ to sun-exposed areas, reapply every 2 hours as needed. - Call for any changes  Seborrheic Keratoses - Stuck-on, waxy, tan-brown papules and/or plaques  - Benign-appearing - Discussed benign etiology and prognosis. - Observe - Call for any changes  Melanocytic Nevi - Tan-brown and/or pink-flesh-colored symmetric macules and papules - Benign appearing on exam today - Observation - Call clinic for new or changing moles - Recommend daily use of broad spectrum spf 30+ sunscreen to sun-exposed areas.  Hemangiomas - Red papules - Discussed benign nature - Observe - Call for any changes  Actinic Damage - Chronic condition, secondary to cumulative UV/sun exposure - diffuse scaly erythematous macules with underlying  dyspigmentation - Recommend daily broad spectrum sunscreen SPF 30+ to sun-exposed areas, reapply every 2 hours as needed.  - Staying in the shade or wearing long sleeves, sun glasses (UVA+UVB protection) and wide brim hats (4-inch brim around the entire circumference of the hat) are also recommended for sun protection.  - Call for new or changing lesions.  Skin cancer screening performed today.   Return in about 1 year (around 01/05/2023) for TBSE.  IMarye Round, CMA, am acting as scribe for Sarina Ser, MD .  Documentation: I have reviewed the above documentation for accuracy and completeness, and I agree with the above.  Sarina Ser, MD

## 2022-01-04 NOTE — Patient Instructions (Addendum)
Cryotherapy Aftercare  Wash gently with soap and water everyday.   Apply Vaseline and Band-Aid daily until healed.     Due to recent changes in healthcare laws, you may see results of your pathology and/or laboratory studies on MyChart before the doctors have had a chance to review them. We understand that in some cases there may be results that are confusing or concerning to you. Please understand that not all results are received at the same time and often the doctors may need to interpret multiple results in order to provide you with the best plan of care or course of treatment. Therefore, we ask that you please give us 2 business days to thoroughly review all your results before contacting the office for clarification. Should we see a critical lab result, you will be contacted sooner.   If You Need Anything After Your Visit  If you have any questions or concerns for your doctor, please call our main line at [phone] and press option 4 to reach your doctor's medical assistant. If no one answers, please leave a voicemail as directed and we will return your call as soon as possible. Messages left after 4 pm will be answered the following business day.   You may also send us a message via MyChart. We typically respond to MyChart messages within 1-2 business days.  For prescription refills, please ask your pharmacy to contact our office. Our fax number is [phone].  If you have an urgent issue when the clinic is closed that cannot wait until the next business day, you can page your doctor at the number below.    Please note that while we do our best to be available for urgent issues outside of office hours, we are not available 24/7.   If you have an urgent issue and are unable to reach us, you may choose to seek medical care at your doctor's office, retail clinic, urgent care center, or emergency room.  If you have a medical emergency, please immediately call 911 or go to the  emergency department.  Pager Numbers  - Dr. Kowalski: [phone]  - Dr. Moye: [phone]  - Dr. Stewart: [phone]  In the event of inclement weather, please call our main line at [phone] for an update on the status of any delays or closures.  Dermatology Medication Tips: Please keep the boxes that topical medications come in in order to help keep track of the instructions about where and how to use these. Pharmacies typically print the medication instructions only on the boxes and not directly on the medication tubes.   If your medication is too expensive, please contact our office at [phone] option 4 or send us a message through MyChart.   We are unable to tell what your co-pay for medications will be in advance as this is different depending on your insurance coverage. However, we may be able to find a substitute medication at lower cost or fill out paperwork to get insurance to cover a needed medication.   If a prior authorization is required to get your medication covered by your insurance company, please allow us 1-2 business days to complete this process.  Drug prices often vary depending on where the prescription is filled and some pharmacies may offer cheaper prices.  The website www.goodrx.com contains coupons for medications through different pharmacies. The prices here do not account for what the cost may be with help from insurance (it may be cheaper with your insurance), but the website can   give you the price if you did not use any insurance.  - You can print the associated coupon and take it with your prescription to the pharmacy.  - You may also stop by our office during regular business hours and pick up a GoodRx coupon card.  - If you need your prescription sent electronically to a different pharmacy, notify our office through [facility] MyChart or by phone at [phone] option 4.     Si Usted Necesita Algo Despus de Su Visita  Tambin puede  enviarnos un mensaje a travs de MyChart. Por lo general respondemos a los mensajes de MyChart en el transcurso de 1 a 2 das hbiles.  Para renovar recetas, por favor pida a su farmacia que se ponga en contacto con nuestra oficina. Nuestro nmero de fax es el [phone].  Si tiene un asunto urgente cuando la clnica est cerrada y que no puede esperar hasta el siguiente da hbil, puede llamar/localizar a su doctor(a) al nmero que aparece a continuacin.   Por favor, tenga en cuenta que aunque hacemos todo lo posible para estar disponibles para asuntos urgentes fuera del horario de oficina, no estamos disponibles las 24 horas del da, los 7 das de la semana.   Si tiene un problema urgente y no puede comunicarse con nosotros, puede optar por buscar atencin mdica  en el consultorio de su doctor(a), en una clnica privada, en un centro de atencin urgente o en una sala de emergencias.  Si tiene una emergencia mdica, por favor llame inmediatamente al 911 o vaya a la sala de emergencias.  Nmeros de bper  - Dr. Kowalski: [phone]  - Dra. Moye: [phone]  - Dra. Stewart: [phone]  En caso de inclemencias del tiempo, por favor llame a nuestra lnea principal al [phone] para una actualizacin sobre el estado de cualquier retraso o cierre.  Consejos para la medicacin en dermatologa: Por favor, guarde las cajas en las que vienen los medicamentos de uso tpico para ayudarle a seguir las instrucciones sobre dnde y cmo usarlos. Las farmacias generalmente imprimen las instrucciones del medicamento slo en las cajas y no directamente en los tubos del medicamento.   Si su medicamento es muy caro, por favor, pngase en contacto con nuestra oficina llamando al [phone] y presione la opcin 4 o envenos un mensaje a travs de MyChart.   No podemos decirle cul ser su copago por los medicamentos por adelantado ya que esto es diferente dependiendo de la cobertura de su seguro.  Sin embargo, es posible que podamos encontrar un medicamento sustituto a menor costo o llenar un formulario para que el seguro cubra el medicamento que se considera necesario.   Si se requiere una autorizacin previa para que su compaa de seguros cubra su medicamento, por favor permtanos de 1 a 2 das hbiles para completar este proceso.  Los precios de los medicamentos varan con frecuencia dependiendo del lugar de dnde se surte la receta y alguna farmacias pueden ofrecer precios ms baratos.  El sitio web www.goodrx.com tiene cupones para medicamentos de diferentes farmacias. Los precios aqu no tienen en cuenta lo que podra costar con la ayuda del seguro (puede ser ms barato con su seguro), pero el sitio web puede darle el precio si no utiliz ningn seguro.  - Puede imprimir el cupn correspondiente y llevarlo con su receta a la farmacia.  - Tambin puede pasar por nuestra oficina durante el horario de atencin regular y recoger una tarjeta de cupones de GoodRx.  -   Si necesita que su receta se enve electrnicamente a una farmacia diferente, informe a nuestra oficina a travs de MyChart de  o por telfono llamando al [phone] y presione la opcin 4.  

## 2022-01-14 ENCOUNTER — Ambulatory Visit (INDEPENDENT_AMBULATORY_CARE_PROVIDER_SITE_OTHER): Payer: Medicare Other

## 2022-01-14 DIAGNOSIS — I495 Sick sinus syndrome: Secondary | ICD-10-CM | POA: Diagnosis not present

## 2022-01-15 ENCOUNTER — Encounter: Payer: Self-pay | Admitting: Dermatology

## 2022-01-20 LAB — CUP PACEART REMOTE DEVICE CHECK
Battery Impedance: 2441 Ohm
Battery Remaining Longevity: 27 mo
Battery Voltage: 2.74 V
Brady Statistic AP VP Percent: 7 %
Brady Statistic AP VS Percent: 87 %
Brady Statistic AS VP Percent: 1 %
Brady Statistic AS VS Percent: 5 %
Date Time Interrogation Session: 20240103075028
Implantable Lead Connection Status: 753985
Implantable Lead Connection Status: 753985
Implantable Lead Implant Date: 20051130
Implantable Lead Implant Date: 20051130
Implantable Lead Location: 753859
Implantable Lead Location: 753860
Implantable Lead Model: 5076
Implantable Pulse Generator Implant Date: 20140901
Lead Channel Impedance Value: 506 Ohm
Lead Channel Impedance Value: 577 Ohm
Lead Channel Pacing Threshold Amplitude: 0.375 V
Lead Channel Pacing Threshold Amplitude: 1 V
Lead Channel Pacing Threshold Pulse Width: 0.4 ms
Lead Channel Pacing Threshold Pulse Width: 0.4 ms
Lead Channel Setting Pacing Amplitude: 2 V
Lead Channel Setting Pacing Amplitude: 2.5 V
Lead Channel Setting Pacing Pulse Width: 0.46 ms
Lead Channel Setting Sensing Sensitivity: 4 mV
Zone Setting Status: 755011
Zone Setting Status: 755011

## 2022-02-02 NOTE — Progress Notes (Signed)
Remote pacemaker transmission.   

## 2022-03-12 ENCOUNTER — Other Ambulatory Visit: Payer: Self-pay | Admitting: Internal Medicine

## 2022-03-23 ENCOUNTER — Encounter: Payer: Self-pay | Admitting: Internal Medicine

## 2022-03-23 ENCOUNTER — Other Ambulatory Visit
Admission: RE | Admit: 2022-03-23 | Discharge: 2022-03-23 | Disposition: A | Discharge status: Home / Self Care | Payer: Medicare Other | Source: Ambulatory Visit | Attending: Internal Medicine | Admitting: Internal Medicine

## 2022-03-23 ENCOUNTER — Ambulatory Visit: Payer: Medicare Other | Attending: Internal Medicine | Admitting: Internal Medicine

## 2022-03-23 VITALS — BP 162/82 | HR 91 | Ht 66.5 in | Wt 124.0 lb

## 2022-03-23 DIAGNOSIS — I495 Sick sinus syndrome: Secondary | ICD-10-CM | POA: Diagnosis present

## 2022-03-23 DIAGNOSIS — Z79899 Other long term (current) drug therapy: Secondary | ICD-10-CM

## 2022-03-23 DIAGNOSIS — Z950 Presence of cardiac pacemaker: Secondary | ICD-10-CM | POA: Diagnosis present

## 2022-03-23 LAB — COMPREHENSIVE METABOLIC PANEL
ALT: 39 U/L (ref 0–44)
AST: 42 U/L — ABNORMAL HIGH (ref 15–41)
Albumin: 4.1 g/dL (ref 3.5–5.0)
Alkaline Phosphatase: 55 U/L (ref 38–126)
Anion gap: 10 (ref 5–15)
BUN: 20 mg/dL (ref 8–23)
CO2: 23 mmol/L (ref 22–32)
Calcium: 9 mg/dL (ref 8.9–10.3)
Chloride: 104 mmol/L (ref 98–111)
Creatinine, Ser: 0.98 mg/dL (ref 0.44–1.00)
GFR, Estimated: 56 mL/min — ABNORMAL LOW (ref >60)
Glucose, Bld: 97 mg/dL (ref 70–99)
Potassium: 4.2 mmol/L (ref 3.5–5.1)
Sodium: 137 mmol/L (ref 135–145)
Total Bilirubin: 0.6 mg/dL (ref 0.3–1.2)
Total Protein: 7.4 g/dL (ref 6.5–8.1)

## 2022-03-23 LAB — CUP PACEART INCLINIC DEVICE CHECK
Battery Impedance: 2479 Ohm
Battery Remaining Longevity: 26 mo
Battery Voltage: 2.73 V
Brady Statistic AP VP Percent: 7 %
Brady Statistic AP VS Percent: 88 %
Brady Statistic AS VP Percent: 1 %
Brady Statistic AS VS Percent: 4 %
Date Time Interrogation Session: 20240305162028
Implantable Lead Connection Status: 753985
Implantable Lead Connection Status: 753985
Implantable Lead Implant Date: 20051130
Implantable Lead Implant Date: 20051130
Implantable Lead Location: 753859
Implantable Lead Location: 753860
Implantable Lead Model: 5076
Implantable Pulse Generator Implant Date: 20140901
Lead Channel Impedance Value: 471 Ohm
Lead Channel Impedance Value: 559 Ohm
Lead Channel Pacing Threshold Amplitude: 0.75 V
Lead Channel Pacing Threshold Amplitude: 1 V
Lead Channel Pacing Threshold Pulse Width: 0.4 ms
Lead Channel Pacing Threshold Pulse Width: 0.4 ms
Lead Channel Sensing Intrinsic Amplitude: 11.2 mV
Lead Channel Setting Pacing Amplitude: 2 V
Lead Channel Setting Pacing Amplitude: 2.5 V
Lead Channel Setting Pacing Pulse Width: 0.4 ms
Lead Channel Setting Sensing Sensitivity: 4 mV
Zone Setting Status: 755011
Zone Setting Status: 755011

## 2022-03-23 LAB — PACEMAKER DEVICE OBSERVATION

## 2022-03-23 LAB — HEMOGLOBIN AND HEMATOCRIT, BLOOD
HCT: 37.7 % (ref 36.0–46.0)
Hemoglobin: 12.1 g/dL (ref 12.0–15.0)

## 2022-03-23 LAB — TSH: TSH: 1.812 u[IU]/mL (ref 0.350–4.500)

## 2022-03-23 MED ORDER — AMIODARONE HCL 200 MG PO TABS: ORAL_TABLET | ORAL | 3 refills | Status: DC

## 2022-03-23 NOTE — Progress Notes (Signed)
Patient Care Team: Kirk Ruths, MD as PCP - General (Unknown Physician Specialty) Deboraha Sprang, MD as PCP - Cardiology (Cardiology) Deboraha Sprang, MD as PCP - Electrophysiology (Cardiology)   HPI  Alejandra Wiley is a 89 y.o. female seen in followup for palpitations associated with atrial arrhythmias as well as bradycardia requiring backup bradycardia pacing. She previously took flecainide.  Resulted in prolonged AV nodal conduction and ventricular pacing.  Deterioration of LV function; flecainide discontinued with subsequent normalization of LV function.  Ended up on amiodarone 12/22 following hospitalization with AFib RVF and acute CHF   Hypertension and orthostasic LH and recurrent falls but without demonstrable orthostatic hypotension  1/20 Hospitalized with stroke- R sided weakness.  INR subtherapeutic 1.52.  >>imaging  CT  No acute stroke.  Carotids neg.  Coumadin d/c and apixoban started ( 2.5 mg bid -wt 55 kg;age->82)  The patient denies chest pain, shortness of breath, nocturnal dyspnea, orthopnea or peripheral edema.  There have been no palpitations, lightheadedness or syncope.    Had a great trip to [place]  Patient denies symptoms of respiratory, GI intolerance, sun sensitivity, neurological symptoms attributable to amiodarone.       Date VP%  3/14 1  3/*16 95  10/17 95  10/18 92-DDD-DDI  6/19 68  10/19 84  4/20  3.3  7/21 9    Patient denies symptoms of respiratory, GI intolerance, sun sensitivity, neurological symptoms attributable to amiodarone.       DATE TEST EF Vp %    /13 Echo  55-65%    8/19 Echo  50-55% 84%  1/20 Echo   30-35%   11/21 Echo  55-60   5/22 Echo  55-55%   12/22 Echo  45% GLOBAL   12/22 Myoview <30%    9/23 Echo  55-60%      Date Cr K Hgb TSH LFT  8/18 0.67  12.2    9/18 0.64  10    1/20 0.95  11.8    6/20 0.9  11.7    5/22 0.77  11.3    10/22 0.8 4.4  4.84   12/22 0.8 3.7 12.5 4.062 25  3/23 0.85 4.5  8.3 2.04   8/23 1.0 4.1 11.8 2.54 29          Thromboembolic risk factors ( age  -2, HTN-1, TIA/CVA-2, DM-1, CHF-1, Gender-1) for a CHADSVASc Score of >=8    Past Medical History:  Diagnosis Date   Anxiety    Aortic atherosclerosis (HCC)    Arthritis    Bradycardia    Breast fibroadenoma, left 05/12/2001   a.) CNB (+) moderate epithelial hyperplasia without atypia and fibrocystic changes   CHF (congestive heart failure) ([place])    Complication of anesthesia    heart stopped at the end of surgery with Dr. Rudene Christians during a back surgery in approx 2019.   Hypertension    Long term current use of anticoagulant    a.) dose reduced apixaban   MRSA nasal colonization 09/17/2016   Pacemaker 12/06/2003   Medtronic Impulse DTDR01   Pacemaker 2015   PAF (paroxysmal atrial fibrillation) (HCC)    a.) CHA2DS2-VASc = 8 (age x 2, sex, CHF, HTN, CVA x 2, aortic plaque). b.) rate/rhythm maintained on oral amiodarone + metoprolol succinate; chronically anticoagulated with dose reduced apixaban   Sick sinus syndrome (HCC)    Sinoatrial node dysfunction (HCC)    Stroke ([place])    Vaginal  intraepithelial neoplasia    VAIN (vaginal intraepithelial neoplasia)     Past Surgical History:  Procedure Laterality Date   APPENDECTOMY     BICEPT TENODESIS Right 04/09/2021   Procedure: BICEPS TENODESIS;  Surgeon: Leim Fabry, MD;  Location: [facility] ORS;  Service: Orthopedics;  Laterality: Right;   BREAST BIOPSY Left 05/12/2001   moderate epithelial hyperplasia without atypia and fibrocystic changes   COLONOSCOPY     HERNIA REPAIR     lower abd   IMAGE GUIDED SINUS SURGERY  2015   INSERT / REPLACE / REMOVE PACEMAKER  12/06/2003   Medtronic impulse [id number]   JOINT REPLACEMENT Right    knee   KYPHOPLASTY N/A 06/23/2017   Procedure: [id number];  Surgeon: Hessie Knows, MD;  Location: [facility] ORS;  Service: Orthopedics;  Laterality: N/A;   LEEP  1999   ORIF HIP FRACTURE Right 02/2016   PACEMAKER GENERATOR  CHANGE N/A 09/18/2012   Procedure: PACEMAKER GENERATOR CHANGE;  Surgeon: Deboraha Sprang, MD;  Location: [facility] CATH LAB;  Service: Cardiovascular;  Laterality: N/A;   REVERSE SHOULDER ARTHROPLASTY Right 04/09/2021   Procedure: Right reverse shoulder arthroplasty for fracture;  Surgeon: Leim Fabry, MD;  Location: [facility] ORS;  Service: Orthopedics;  Laterality: Right;   TONSILLECTOMY     TOTAL KNEE ARTHROPLASTY     TOTAL KNEE REVISION Right 09/23/2016   Procedure: TOTAL KNEE REVISION-POLYETHYLENE EXCHANGE;  Surgeon: Hessie Knows, MD;  Location: [facility] ORS;  Service: Orthopedics;  Laterality: Right;   VAGINAL HYSTERECTOMY      Current Outpatient Medications  Medication Sig Dispense Refill   acetaminophen (TYLENOL) 500 MG tablet Take 500 mg by mouth every 8 (eight) hours as needed for mild pain or headache.     amiodarone (PACERONE) 200 MG tablet TAKE 1 TABLET DAILY 90 tablet 3   amLODipine (NORVASC) 2.5 MG tablet TAKE 1 TABLET DAILY 90 tablet 2   ELIQUIS 2.5 MG TABS tablet TAKE 1 TABLET TWICE A DAY 180 tablet 3   ibandronate (BONIVA) 150 MG tablet Take 150 mg by mouth every 30 (thirty) days. Take in the morning with a full glass of water, on an empty stomach, and do not take anything else by mouth or lie down for the next 30 min.     lidocaine (LIDODERM) 5 % Place 1 patch onto the skin every 12 (twelve) hours. Remove & Discard patch within 12 hours or as directed by MD 10 patch 0   metoprolol succinate (TOPROL-XL) 50 MG 24 hr tablet TAKE 1 TABLET DAILY WITH OR IMMEDIATELY FOLLOWING A MEAL 90 tablet 3   Multiple Vitamins-Minerals (SENIOR MULTIVITAMIN PLUS PO) Take 1 tablet by mouth daily.     oxyCODONE (ROXICODONE) 5 MG immediate release tablet Take 1 tablet (5 mg total) by mouth every 8 (eight) hours as needed. (Patient not taking: Reported on 03/23/2022) 20 tablet 0   No current facility-administered medications for this visit.    Allergies  Allergen Reactions   Latex Other (See Comments)     blisters   Other Other (See Comments)    Blisters/ paper tape is Market researcher paper tape is Market researcher paper tape is Clinical research associate paper tape is Ok   Adult nurse (See Comments)    Blisters/ paper tape is Market researcher paper tape is Ok   Xarelto [Rivaroxaban] Other (See Comments)    Gums bleeding and too much bruising   Nickel Rash and Other (See Comments)    Review of Systems negative except from HPI and  PMH BP (!) 156/90 (BP Location: Left Arm, Patient Position: Sitting, Cuff Size: Normal)   Pulse 91   Ht 5' 6.5" (1.689 m)   Wt 124 lb (56.2 kg)   SpO2 98%   BMI 19.71 kg/m  Well developed and well nourished in no acute distress HENT normal Neck supple with JVP-flat Clear Device pocket well healed; without hematoma or erythema.  There is no tethering  Regular rate and rhythm, no murmur Abd-soft with active BS No Clubbing cyanosis   edema Skin-warm and dry A & Oriented  Grossly normal sensory and motor function  ECG atrial pacing with prolonged AV conduction and Wenke block periodicity resulting in backup ventricular pacing  Device function is normal. Programming changes NONE  See Paceart for details    Assessment and  Plan  Atrial arrhythmia/Fib (5.9%) with some RVR   Sinus node dysfunction  Hypertension   Syncope  High Risk Medication Surveillance amiodarone  Pacemaker  Medtronic  Dual chamber   Elevated pacing thresholds A Greater    HFpEF>> LV dysfunction resolved and now recurring   Orthostatic hypotension  1 AVB-profound  Anemia   Doing exceedingly well, no lightheadedness.  Blood pressure is better still elevated here today at 160 on the second reading.  However, numbers at home are typically in the 120 range history of orthostasis and falls, we will except the blood pressure as it is unless there becomes worsening of persistent systolic hypertension  No interval atrial fibrillation.  However, because she has had heart failure with recurrent  rapid AF, we will decrease her amiodarone only slightly with a target of 1000 mg a week  Needs amiodarone surveillance laboratories.  Mildly anemic we will recheck

## 2022-03-23 NOTE — Patient Instructions (Signed)
Medication Instructions:  Your physician recommends that you continue on your current medications as directed. Please refer to the Current Medication list given to you today.  *If you need a refill on your cardiac medications before your next appointment, please call your pharmacy*   Lab Work: Your physician recommends that you return for lab work in: CMET, TSH, [place] at [facility] 1st desk on the right to check in (REGISTRATION)  Lab hours: Monday- Friday (7:30 am- 5:30 pm)  If you have labs (blood work) drawn today and your tests are completely normal, you will receive your results only by: MyChart Message (if you have MyChart) OR A paper copy in the mail If you have any lab test that is abnormal or we need to change your treatment, we will call you to review the results.   Testing/Procedures: -None ordered   Follow-Up: At [facility], you and your health needs are our priority.  As part of our continuing mission to provide you with exceptional heart care, we have created designated Provider Care Teams.  These Care Teams include your primary Cardiologist (physician) and Advanced Practice Providers (APPs -  Physician Assistants and Nurse Practitioners) who all work together to provide you with the care you need, when you need it.  We recommend signing up for the patient portal called "MyChart".  Sign up information is provided on this After Visit Summary.  MyChart is used to connect with patients for Virtual Visits (Telemedicine).  Patients are able to view lab/test results, encounter notes, upcoming appointments, etc.  Non-urgent messages can be sent to your provider as well.   To learn more about what you can do with MyChart, go to [email or web address].    Your next appointment:   6 month(s)  Provider:   [name], MD    Other Instructions -None

## 2022-03-26 ENCOUNTER — Encounter: Payer: Self-pay | Admitting: Internal Medicine

## 2022-04-05 ENCOUNTER — Telehealth: Payer: Self-pay

## 2022-04-05 DIAGNOSIS — R748 Abnormal levels of other serum enzymes: Secondary | ICD-10-CM

## 2022-04-05 DIAGNOSIS — Z79899 Other long term (current) drug therapy: Secondary | ICD-10-CM

## 2022-04-05 NOTE — Telephone Encounter (Signed)
-----   Message from Deboraha Sprang, MD sent at 04/03/2022  9:24 AM EDT -----  Please inform patient that drug surveillance labs are mildly abnormal with elevation of liver tests but just barely  will recheck in 3 months

## 2022-04-05 NOTE — Telephone Encounter (Signed)
Spoke with pt and advised of lab result and recommendation per Dr Caryl Comes as below.  Pt verbalizes understanding and agrees with current plan.  CMET order placed and pt will come to medical mall at [facility] for labs the week of July 12, 2022.

## 2022-04-15 ENCOUNTER — Ambulatory Visit (INDEPENDENT_AMBULATORY_CARE_PROVIDER_SITE_OTHER): Payer: Medicare Other

## 2022-04-15 DIAGNOSIS — I495 Sick sinus syndrome: Secondary | ICD-10-CM | POA: Diagnosis not present

## 2022-04-20 LAB — CUP PACEART REMOTE DEVICE CHECK
Battery Impedance: 2354 Ohm
Battery Remaining Longevity: 27 mo
Battery Voltage: 2.73 V
Brady Statistic AP VP Percent: 29 %
Brady Statistic AP VS Percent: 64 %
Brady Statistic AS VP Percent: 5 %
Brady Statistic AS VS Percent: 2 %
Date Time Interrogation Session: 20240401203532
Implantable Lead Connection Status: 753985
Implantable Lead Connection Status: 753985
Implantable Lead Implant Date: 20051130
Implantable Lead Implant Date: 20051130
Implantable Lead Location: 753859
Implantable Lead Location: 753860
Implantable Lead Model: 5076
Implantable Pulse Generator Implant Date: 20140901
Lead Channel Impedance Value: 489 Ohm
Lead Channel Impedance Value: 576 Ohm
Lead Channel Pacing Threshold Amplitude: 0.75 V
Lead Channel Pacing Threshold Amplitude: 1 V
Lead Channel Pacing Threshold Pulse Width: 0.4 ms
Lead Channel Pacing Threshold Pulse Width: 0.4 ms
Lead Channel Setting Pacing Amplitude: 2 V
Lead Channel Setting Pacing Amplitude: 2.5 V
Lead Channel Setting Pacing Pulse Width: 0.4 ms
Lead Channel Setting Sensing Sensitivity: 4 mV
Zone Setting Status: 755011
Zone Setting Status: 755011

## 2022-05-24 NOTE — Progress Notes (Signed)
Remote pacemaker transmission.   

## 2022-05-31 ENCOUNTER — Telehealth: Payer: Self-pay | Admitting: Internal Medicine

## 2022-05-31 NOTE — Progress Notes (Unsigned)
Cardiology Office Note Date:  06/01/2022  Patient ID:  Alejandra Wiley, DOB May 21, 1932, MRN [id number] PCP:  Lauro Regulus, MD  Cardiologist:  Sherryl Manges, MD Electrophysiologist: Sherryl Manges, MD    Chief Complaint: lightheadedness  History of Present Illness: Alejandra Wiley is a 89 y.o. female with PMH notable for SND s/p PPM, parox AFib, HFimpEF, HTN, CVA ; seen today for Sherryl Manges, MD for acute visit due to afib.   The patient saw Dr. Graciela Husbands 03/2022 was doing very well. HTN in office, but home readings much more normal. He decreased amiodarone to 1000mg  / week.  She called triage nurses yesterday c/o lightheadedness, thinks she is in AFib for past 2 days.   Today, she tells me that she has been very weak and dizzy. She had to stop several times when walking from the clinic waiting room to exam room.  She also started having chest pressure yesterday.  Denies syncope. Denies palpitations.   Her husband accompanies her to appointment.  Diligently takes eliquis BID, no GI/GU bleeding concerns. Has had rare bloody nasal drainage.     Device Information: MDT dual chamber PPM, imp 11/2003; dx SND St. Jude RA lead Gen change 09/2012  AAD History: Flecainide - prolonged AV conduction > inc Vpacing > dec LV function.  - flec d/c with normalization of LV function  Past Medical History:  Diagnosis Date   Anxiety    Aortic atherosclerosis (HCC)    Arthritis    Bradycardia    Breast fibroadenoma, left 05/12/2001   a.) CNB (+) moderate epithelial hyperplasia without atypia and fibrocystic changes   CHF (congestive heart failure) (HCC)    Complication of anesthesia    heart stopped at the end of surgery with Dr. Rosita Kea during a back surgery in approx 2019.   Hypertension    Long term current use of anticoagulant    a.) dose reduced apixaban   MRSA nasal colonization 09/17/2016   Pacemaker 12/06/2003   Medtronic Impulse DTDR01   Pacemaker 2015   PAF (paroxysmal  atrial fibrillation) (HCC)    a.) CHA2DS2-VASc = 8 (age x 2, sex, CHF, HTN, CVA x 2, aortic plaque). b.) rate/rhythm maintained on oral amiodarone + metoprolol succinate; chronically anticoagulated with dose reduced apixaban   Sick sinus syndrome (HCC)    Sinoatrial node dysfunction (HCC)    Stroke (HCC)    Vaginal intraepithelial neoplasia    VAIN (vaginal intraepithelial neoplasia)     Past Surgical History:  Procedure Laterality Date   APPENDECTOMY     BICEPT TENODESIS Right 04/09/2021   Procedure: BICEPS TENODESIS;  Surgeon: Signa Kell, MD;  Location: [facility] ORS;  Service: Orthopedics;  Laterality: Right;   BREAST BIOPSY Left 05/12/2001   moderate epithelial hyperplasia without atypia and fibrocystic changes   COLONOSCOPY     HERNIA REPAIR     lower abd   IMAGE GUIDED SINUS SURGERY  2015   INSERT / REPLACE / REMOVE PACEMAKER  12/06/2003   Medtronic impulse DTDR01   JOINT REPLACEMENT Right    knee   KYPHOPLASTY N/A 06/23/2017   Procedure: [id number];  Surgeon: Kennedy Bucker, MD;  Location: [facility] ORS;  Service: Orthopedics;  Laterality: N/A;   LEEP  1999   ORIF HIP FRACTURE Right 02/2016   PACEMAKER GENERATOR CHANGE N/A 09/18/2012   Procedure: PACEMAKER GENERATOR CHANGE;  Surgeon: Duke Salvia, MD;  Location: [facility] CATH LAB;  Service: Cardiovascular;  Laterality: N/A;   REVERSE SHOULDER ARTHROPLASTY Right 04/09/2021  Procedure: Right reverse shoulder arthroplasty for fracture;  Surgeon: Signa Kell, MD;  Location: [facility] ORS;  Service: Orthopedics;  Laterality: Right;   TONSILLECTOMY     TOTAL KNEE ARTHROPLASTY     TOTAL KNEE REVISION Right 09/23/2016   Procedure: TOTAL KNEE REVISION-POLYETHYLENE EXCHANGE;  Surgeon: Kennedy Bucker, MD;  Location: [facility] ORS;  Service: Orthopedics;  Laterality: Right;   VAGINAL HYSTERECTOMY      Current Outpatient Medications  Medication Instructions   acetaminophen (TYLENOL) 500 mg, Oral, Every 8 hours PRN   amiodarone (PACERONE) 200 MG  tablet Take 1 tablet by mouth 5 days a week   amLODipine (NORVASC) 2.5 mg, Oral, Daily   ELIQUIS 2.5 MG TABS tablet TAKE 1 TABLET TWICE A DAY   ibandronate (BONIVA) 150 mg, Oral, Every 30 days, Take in the morning with a full glass of water, on an empty stomach, and do not take anything else by mouth or lie down for the next 30 min.    metoprolol succinate (TOPROL-XL) 50 MG 24 hr tablet TAKE 1 TABLET DAILY WITH OR IMMEDIATELY FOLLOWING A MEAL   Multiple Vitamins-Minerals (SENIOR MULTIVITAMIN PLUS PO) 1 tablet, Oral, Daily    Social History:  The patient  reports that she has never smoked. She has never used smokeless tobacco. She reports current alcohol use. She reports that she does not use drugs.   Family History:  The patient's family history includes Diabetes in her daughter and son; Heart disease in her father; Stroke in her mother.  ROS:  Please see the history of present illness. All other systems are reviewed and otherwise negative.   PHYSICAL EXAM:  VS:  BP (!) 150/90 (BP Location: Left Arm, Patient Position: Sitting, Cuff Size: Normal)   Pulse 92   Ht 5' 6.5" (1.689 m)   Wt 121 lb (54.9 kg)   SpO2 97%   BMI 19.24 kg/m  BMI: Body mass index is 19.24 kg/m.  GEN- The patient is well appearing, alert and oriented x 3 today.   Lungs- Clear to ausculation bilaterally, normal work of breathing.  Heart- Regular rate and rhythm, no murmurs, rubs or gallops Extremities- No peripheral edema, warm, dry Skin-   device pocket well-healed, no tethering   Device interrogation done today and reviewed by myself:  Battery ~24 months Lead thresholds, impedence, sensing stable  Presents in AP-VS with long PR interval measured at 420-417ms Frequent PACs AF burden 0.2% One 3 minute AF episode roughly corresponds to beginning of patient's symptoms VP ~40% No changes made today  EKG is ordered. Personal review of EKG from today shows:  Intermittent AV pace with long AV delay, rate  90bpm  Recent Labs: 03/23/2022: ALT 39; BUN 20; Creatinine, Ser 0.98; Potassium 4.2; Sodium 137; TSH 1.812 06/01/2022: Hemoglobin 12.8; Platelets 203  No results found for requested labs within last 365 days.   CrCl cannot be calculated (Patient's most recent lab result is older than the maximum 21 days allowed.).   Wt Readings from Last 3 Encounters:  06/01/22 121 lb 0.5 oz (54.9 kg)  06/01/22 121 lb (54.9 kg)  03/23/22 124 lb (56.2 kg)     Additional studies reviewed include: Previous EP, cardiology notes.   TTE, 10/15/2021  1. Left ventricular ejection fraction, by estimation, is 55 to 60%. The left ventricle has normal function. The left ventricle has no regional wall motion abnormalities. Left ventricular diastolic parameters are consistent with Grade II diastolic dysfunction (pseudonormalization).   2. Right ventricular systolic function is normal.  The right ventricular size is normal. There is mildly elevated pulmonary artery systolic  pressure. The estimated right ventricular systolic pressure is 41.5 mmHg.   3. Left atrial size was mild to moderately dilated.   4. Right atrial size was mildly dilated.   5. The mitral valve is normal in structure. Mild to moderate mitral valve regurgitation. No evidence of mitral stenosis.   6. Tricuspid valve regurgitation is moderate.   7. The aortic valve is tricuspid. Aortic valve regurgitation is not visualized. No aortic stenosis is present.   8. The inferior vena cava is normal in size with greater than 50% respiratory variability, suggesting right atrial pressure of 3 mmHg.  Comparison(s): 12/31/20-EF45% MVR TVR.    ASSESSMENT AND PLAN:  #) SND s/p PPM  Device functioning well D/t patient's prolonged AV conduction, is maintaining in MVP mode Considered changing to DDD, but this would result in increased RV pacing In past, increased RV pacing has lead to decreased LVEF and HF, so do not favor that adjustment at this time No  arrhythmias to suggest patient's ongoing symptoms   #) Dizziness #) Weakness #) Chest pressure No arrhythmias by device  Slightly hypertensive in office Had h/o orthostatic symptoms Patient felt as though she might pass out while seated in clinic Recommend she proceed to ER for further evaluation and workup Patient and husband agreeable to this   Current medicines are reviewed at length with the patient today.   The patient does not have concerns regarding her medicines.  The following changes were made today:  none  Labs/ tests ordered today include:  Orders Placed This Encounter  Procedures   EKG 12-Lead     Disposition: Follow up with TBD based on hospital course   Signed, Sherie Don, NP  06/01/22  11:48 AM  Electrophysiology [facility]

## 2022-05-31 NOTE — Telephone Encounter (Signed)
Patient is returning call. Transferred to Marsha, RN.  

## 2022-05-31 NOTE — Telephone Encounter (Signed)
Spoke with pt who complains of current Afib "episode" for the past 2 days.  Pt reports BP 106/55 with HR - 73.  Pt complains of lightheadedness, weakness and fatigue, has experienced some chest pressure yesterday and this morning.  Pt denies current CP, SOB or dizziness/fainting.  Appointment scheduled with Sherie Don, NP 06/01/2022.  Reviewed ED precautions.  Pt verbalizes understanding and  agrees with current plan.

## 2022-05-31 NOTE — Telephone Encounter (Signed)
Attempted phone call to pt and left voicemail message to contact RN at [phone]. 

## 2022-05-31 NOTE — Telephone Encounter (Signed)
Patient calling in to say that she is weak, sone lightheadedness and has no energy. Also states she not feeling 100%. Please advise    .

## 2022-06-01 ENCOUNTER — Ambulatory Visit: Payer: Medicare Other | Attending: Cardiology | Admitting: Cardiology

## 2022-06-01 ENCOUNTER — Encounter: Payer: Self-pay | Admitting: Cardiology

## 2022-06-01 ENCOUNTER — Emergency Department
Admission: EM | Admit: 2022-06-01 | Discharge: 2022-06-01 | Disposition: A | Discharge status: Home / Self Care | Payer: Medicare Other | Attending: Emergency Medicine | Admitting: Emergency Medicine

## 2022-06-01 ENCOUNTER — Emergency Department: Payer: Medicare Other

## 2022-06-01 ENCOUNTER — Other Ambulatory Visit: Payer: Self-pay

## 2022-06-01 ENCOUNTER — Encounter: Payer: Self-pay | Admitting: Emergency Medicine

## 2022-06-01 VITALS — BP 150/90 | HR 92 | Ht 66.5 in | Wt 121.0 lb

## 2022-06-01 DIAGNOSIS — R531 Weakness: Secondary | ICD-10-CM | POA: Diagnosis not present

## 2022-06-01 DIAGNOSIS — R002 Palpitations: Secondary | ICD-10-CM | POA: Diagnosis present

## 2022-06-01 DIAGNOSIS — I5032 Chronic diastolic (congestive) heart failure: Secondary | ICD-10-CM | POA: Diagnosis not present

## 2022-06-01 DIAGNOSIS — I480 Paroxysmal atrial fibrillation: Secondary | ICD-10-CM | POA: Diagnosis present

## 2022-06-01 DIAGNOSIS — I495 Sick sinus syndrome: Secondary | ICD-10-CM | POA: Insufficient documentation

## 2022-06-01 DIAGNOSIS — Z950 Presence of cardiac pacemaker: Secondary | ICD-10-CM | POA: Insufficient documentation

## 2022-06-01 DIAGNOSIS — Z79899 Other long term (current) drug therapy: Secondary | ICD-10-CM | POA: Insufficient documentation

## 2022-06-01 DIAGNOSIS — R42 Dizziness and giddiness: Secondary | ICD-10-CM | POA: Diagnosis present

## 2022-06-01 LAB — CUP PACEART INCLINIC DEVICE CHECK
Date Time Interrogation Session: 20240514131524
Implantable Lead Connection Status: 753985
Implantable Lead Connection Status: 753985
Implantable Lead Implant Date: 20051130
Implantable Lead Implant Date: 20051130
Implantable Lead Location: 753859
Implantable Lead Location: 753860
Implantable Lead Model: 5076
Implantable Pulse Generator Implant Date: 20140901

## 2022-06-01 LAB — TROPONIN I (HIGH SENSITIVITY): Troponin I (High Sensitivity): 7 ng/L (ref <18)

## 2022-06-01 LAB — CBC
HCT: 40.4 % (ref 36.0–46.0)
Hemoglobin: 12.8 g/dL (ref 12.0–15.0)
MCH: 31.3 pg (ref 26.0–34.0)
MCHC: 31.7 g/dL (ref 30.0–36.0)
MCV: 98.8 fL (ref 80.0–100.0)
Platelets: 203 10*3/uL (ref 150–400)
RBC: 4.09 MIL/uL (ref 3.87–5.11)
RDW: 13.9 % (ref 11.5–15.5)
WBC: 7.2 10*3/uL (ref 4.0–10.5)
nRBC: 0 % (ref 0.0–0.2)

## 2022-06-01 LAB — BASIC METABOLIC PANEL
Anion gap: 8 (ref 5–15)
BUN: 26 mg/dL — ABNORMAL HIGH (ref 8–23)
CO2: 23 mmol/L (ref 22–32)
Calcium: 9 mg/dL (ref 8.9–10.3)
Chloride: 105 mmol/L (ref 98–111)
Creatinine, Ser: 1.03 mg/dL — ABNORMAL HIGH (ref 0.44–1.00)
GFR, Estimated: 53 mL/min — ABNORMAL LOW (ref >60)
Glucose, Bld: 98 mg/dL (ref 70–99)
Potassium: 4.9 mmol/L (ref 3.5–5.1)
Sodium: 136 mmol/L (ref 135–145)

## 2022-06-01 NOTE — ED Triage Notes (Signed)
Pt here from [facility] with tachycardia. Pt has a hx of fib and had a rate in the 180s. Pt was hooked up to the EKG in triage and converted down to the 70s. Pt states she feels "washed out and weak" but denies chest pain, just pressure. Pt alert and oriented in triage.

## 2022-06-01 NOTE — ED Provider Notes (Signed)
[facility] Provider Note    Event Date/Time   First MD Initiated Contact with Patient 06/01/22 1138     (approximate)   History   Atrial Fibrillation   HPI  Alejandra Wiley is a 89 y.o. female with a history of palpitations associated with atrial arrhythmias as well as bradycardia requiring backup bradycardia pacing.  Patient is currently on amiodarone, sees [facility] Dr. Hurman Horn of electrophysiology.  Sent from his office this morning because of 1 day of palpitations, feeling weak and wiped out.  Was reportedly tachycardic there.  Patient denies chest pain to me.  She reports this feels similar to prior episodes of atrial fibrillation/atrial arrhythmia     Physical Exam   Triage Vital Signs: ED Triage Vitals [06/01/22 1130]  Enc Vitals Group     BP (!) 157/75     Pulse Rate 74     Resp 16     Temp 97.7 F (36.5 C)     Temp Source Oral     SpO2 96 %     Weight 54.9 kg (121 lb 0.5 oz)     Height 1.689 m (5' 6.5")     Head Circumference      Peak Flow      Pain Score 0     Pain Loc      Pain Edu?      Excl. in GC?     Most recent vital signs: Vitals:   06/01/22 1130  BP: (!) 157/75  Pulse: 74  Resp: 16  Temp: 97.7 F (36.5 C)  SpO2: 96%     General: Awake, no distress.  CV:  Good peripheral perfusion.  Regular, normal rate Resp:  Normal effort.  Abd:  No distention.  Other:     ED Results / Procedures / Treatments   Labs (all labs ordered are listed, but only abnormal results are displayed) Labs Reviewed  BASIC METABOLIC PANEL - Abnormal; Notable for the following components:      Result Value   BUN 26 (*)    Creatinine, Ser 1.03 (*)    GFR, Estimated 53 (*)    All other components within normal limits  CBC  TROPONIN I (HIGH SENSITIVITY)     EKG  ED ECG REPORT I, Jene Every, the attending physician, personally viewed and interpreted this ECG.  Date: 06/01/2022  Rhythm: Atrial paced rhythm QRS Axis:  normal Intervals: normal ST/T Wave abnormalities: normal Narrative Interpretation: PVCs    RADIOLOGY X-ray viewed interpreted me, no acute abnormality    PROCEDURES:  Critical Care performed:   Procedures   MEDICATIONS ORDERED IN ED: Medications - No data to display   IMPRESSION / MDM / ASSESSMENT AND PLAN / ED COURSE  I reviewed the triage vital signs and the nursing notes. Patient's presentation is most consistent with acute presentation with potential threat to life or bodily function.  Patient presents with palpitations, weakness, lightheadedness as detailed above, started over the last 24 hours, she is on blood thinners.  Long history of atrial arrhythmias.  In triage the patient apparently may have converted, her heart rate is regular at this time and she is feeling improved.  Lab work is pending, chest x-ray is pending   ----------------------------------------- 12:30 PM on 06/01/2022 ----------------------------------------- Lab work is reassuring, high sensitive troponin is normal, chest x-ray is clear, repeat EKG demonstrates normal atrial paced rhythm  ----------------------------------------- 12:41 PM on 06/01/2022 ----------------------------------------- Offered admission however the patient would like to  go home, she states that she is feeling fine and wants to stop by the [employer] on the way home to get a salad.     FINAL CLINICAL IMPRESSION(S) / ED DIAGNOSES   Final diagnoses:  Palpitations     Rx / DC Orders   ED Discharge Orders     None        Note:  This document was prepared using Dragon voice recognition software and may include unintentional dictation errors.   Jene Every, MD 06/01/22 1242

## 2022-06-02 ENCOUNTER — Other Ambulatory Visit: Payer: Self-pay | Admitting: Internal Medicine

## 2022-07-12 ENCOUNTER — Other Ambulatory Visit
Admission: RE | Admit: 2022-07-12 | Discharge: 2022-07-12 | Disposition: A | Discharge status: Home / Self Care | Payer: Medicare Other | Attending: Internal Medicine | Admitting: Internal Medicine

## 2022-07-12 DIAGNOSIS — R748 Abnormal levels of other serum enzymes: Secondary | ICD-10-CM | POA: Insufficient documentation

## 2022-07-12 DIAGNOSIS — Z79899 Other long term (current) drug therapy: Secondary | ICD-10-CM | POA: Diagnosis present

## 2022-07-12 LAB — COMPREHENSIVE METABOLIC PANEL
ALT: 25 U/L (ref 0–44)
AST: 30 U/L (ref 15–41)
Albumin: 3.9 g/dL (ref 3.5–5.0)
Alkaline Phosphatase: 57 U/L (ref 38–126)
Anion gap: 10 (ref 5–15)
BUN: 21 mg/dL (ref 8–23)
CO2: 26 mmol/L (ref 22–32)
Calcium: 9.2 mg/dL (ref 8.9–10.3)
Chloride: 102 mmol/L (ref 98–111)
Creatinine, Ser: 0.98 mg/dL (ref 0.44–1.00)
GFR, Estimated: 56 mL/min — ABNORMAL LOW (ref >60)
Glucose, Bld: 124 mg/dL — ABNORMAL HIGH (ref 70–99)
Potassium: 4.2 mmol/L (ref 3.5–5.1)
Sodium: 138 mmol/L (ref 135–145)
Total Bilirubin: 0.8 mg/dL (ref 0.3–1.2)
Total Protein: 7.7 g/dL (ref 6.5–8.1)

## 2022-07-15 ENCOUNTER — Ambulatory Visit (INDEPENDENT_AMBULATORY_CARE_PROVIDER_SITE_OTHER): Payer: Medicare Other

## 2022-07-15 DIAGNOSIS — I495 Sick sinus syndrome: Secondary | ICD-10-CM

## 2022-07-19 ENCOUNTER — Other Ambulatory Visit: Payer: Self-pay | Admitting: Internal Medicine

## 2022-07-20 LAB — CUP PACEART REMOTE DEVICE CHECK
Battery Impedance: 2674 Ohm
Battery Remaining Longevity: 24 mo
Battery Voltage: 2.72 V
Brady Statistic AP VP Percent: 28 %
Brady Statistic AP VS Percent: 61 %
Brady Statistic AS VP Percent: 6 %
Brady Statistic AS VS Percent: 5 %
Date Time Interrogation Session: 20240629100944
Implantable Lead Connection Status: 753985
Implantable Lead Connection Status: 753985
Implantable Lead Implant Date: 20051130
Implantable Lead Implant Date: 20051130
Implantable Lead Location: 753859
Implantable Lead Location: 753860
Implantable Lead Model: 5076
Implantable Pulse Generator Implant Date: 20140901
Lead Channel Impedance Value: 478 Ohm
Lead Channel Impedance Value: 570 Ohm
Lead Channel Pacing Threshold Amplitude: 0.75 V
Lead Channel Pacing Threshold Amplitude: 1 V
Lead Channel Pacing Threshold Pulse Width: 0.4 ms
Lead Channel Pacing Threshold Pulse Width: 0.4 ms
Lead Channel Setting Pacing Amplitude: 2 V
Lead Channel Setting Pacing Amplitude: 2.5 V
Lead Channel Setting Pacing Pulse Width: 0.4 ms
Lead Channel Setting Sensing Sensitivity: 2.8 mV
Zone Setting Status: 755011
Zone Setting Status: 755011

## 2022-07-29 NOTE — Progress Notes (Signed)
Remote pacemaker transmission.   

## 2022-08-24 ENCOUNTER — Other Ambulatory Visit: Payer: Self-pay

## 2022-08-24 ENCOUNTER — Other Ambulatory Visit (HOSPITAL_COMMUNITY): Payer: Self-pay

## 2022-08-24 MED ORDER — PAXLOVID (150/100) 10 X 150 MG & 10 X 100MG PO TBPK
ORAL_TABLET | ORAL | 0 refills | Status: DC
  Filled 2022-08-24 (×2): qty 20, 5d supply, fill #0

## 2022-09-06 ENCOUNTER — Telehealth: Payer: Self-pay | Admitting: Internal Medicine

## 2022-09-06 NOTE — Telephone Encounter (Signed)
Alejandra Wiley   sure no problem  N  thanks for the thorough note  SK

## 2022-09-06 NOTE — Telephone Encounter (Signed)
Nilda Calamity wanted to know if we could change Metoprolol to Propanolol. For essential tremor. Please advise

## 2022-09-07 NOTE — Telephone Encounter (Signed)
Spoke with Dr.Klein who advised okay to switch patient from Metoprolol Succinate 50 mg over to Propranolol LA 80 mg.   Called patient, LVM to call back to discuss. Advised that before sending to pharmacy we should notify her of any changes. Attempted to contact Neurology office as well and they were closed.

## 2022-09-09 MED ORDER — PROPRANOLOL HCL ER 80 MG PO CP24: 80.0000 mg | ORAL_CAPSULE | Freq: Every day | ORAL | 3 refills | Status: DC

## 2022-09-09 NOTE — Telephone Encounter (Signed)
Called patient back, advised okay to changed medication- she is aware medication will be sent to her, different dosage due to medication change, and not to take Metoprolol with the Propanolol. Patient aware to call if any questions.

## 2022-09-09 NOTE — Telephone Encounter (Signed)
 Pt is returning nurse call and is requesting a callback. Please advise

## 2022-09-10 ENCOUNTER — Emergency Department (HOSPITAL_COMMUNITY): Payer: Medicare Other

## 2022-09-10 ENCOUNTER — Emergency Department (HOSPITAL_COMMUNITY)
Admission: EM | Admit: 2022-09-10 | Discharge: 2022-09-10 | Disposition: A | Discharge status: Home / Self Care | Payer: Medicare Other | Attending: Emergency Medicine | Admitting: Emergency Medicine

## 2022-09-10 DIAGNOSIS — Z7901 Long term (current) use of anticoagulants: Secondary | ICD-10-CM

## 2022-09-10 DIAGNOSIS — R519 Headache, unspecified: Secondary | ICD-10-CM | POA: Insufficient documentation

## 2022-09-10 DIAGNOSIS — Z91040 Latex allergy status: Secondary | ICD-10-CM | POA: Insufficient documentation

## 2022-09-10 DIAGNOSIS — Y9301 Activity, walking, marching and hiking: Secondary | ICD-10-CM | POA: Insufficient documentation

## 2022-09-10 DIAGNOSIS — W01198A Fall on same level from slipping, tripping and stumbling with subsequent striking against other object, initial encounter: Secondary | ICD-10-CM | POA: Insufficient documentation

## 2022-09-10 DIAGNOSIS — R220 Localized swelling, mass and lump, head: Secondary | ICD-10-CM | POA: Insufficient documentation

## 2022-09-10 DIAGNOSIS — W19XXXA Unspecified fall, initial encounter: Secondary | ICD-10-CM

## 2022-09-10 MED ORDER — ACETAMINOPHEN 325 MG PO TABS
650.0000 mg | ORAL_TABLET | Freq: Once | ORAL | Status: AC
  Administered 2022-09-10: 650 mg via ORAL
  Filled 2022-09-10: qty 2

## 2022-09-10 NOTE — Discharge Instructions (Signed)
Please use your cane to walk.  Return if you are having worsening symptoms such as increased headache or other new symptoms. Please follow-up with your doctor next week

## 2022-09-10 NOTE — ED Notes (Signed)
Patient walk to Nurses Station with assist, She stated she felt a little light headed, but she done very well walking back to bed.

## 2022-09-10 NOTE — ED Triage Notes (Signed)
Pt present to the ED from the [facility] independent living facility d/t fall on Wednesday. Per EMS, pt fell while walking dog, hitting left side of head onto concrete. Pt denies LOC. Pt c/o n/v, onset 2000. Pt noted with unsteady gait while ambulating to bed.  There were no vitals taken for this visit.: 143/68, 98% RA, CBG 142

## 2022-09-10 NOTE — ED Provider Notes (Signed)
[place] EMERGENCY DEPARTMENT AT [facility] Provider Note   CSN: [id number] Arrival date & time: 09/10/22  1207     History  Chief Complaint  Patient presents with   Alejandra Wiley is a 89 y.o. female.  HPI 89 year old female on Eliquis presents today with fall 2 days ago.  She states that she was out walking her dog when she had bent over to scoop up poop, the dog started after something and pulled her off balance.  States she went backward and struck her head.  She did not lose consciousness.  [place] the Donnella Sham is found her and helped her get up and back to her apartment.  She has had ongoing headache.  She denies other injuries.    Home Medications Prior to Admission medications   Medication Sig Start Date End Date Taking? Authorizing Provider  acetaminophen (TYLENOL) 500 MG tablet Take 500 mg by mouth every 8 (eight) hours as needed for mild pain or headache.    [provider]  amiodarone (PACERONE) 200 MG tablet Take 1 tablet by mouth 5 days a week 03/23/22   Duke Salvia, MD  amLODipine (NORVASC) 2.5 MG tablet TAKE 1 TABLET DAILY 06/02/22   Duke Salvia, MD  ELIQUIS 2.5 MG TABS tablet TAKE 1 TABLET TWICE A DAY 12/01/21   Duke Salvia, MD  ibandronate (BONIVA) 150 MG tablet Take 150 mg by mouth every 30 (thirty) days. Take in the morning with a full glass of water, on an empty stomach, and do not take anything else by mouth or lie down for the next 30 min.    [provider]  Multiple Vitamins-Minerals (SENIOR MULTIVITAMIN PLUS PO) Take 1 tablet by mouth daily.    [provider]  nirmatrelvir & ritonavir (PAXLOVID, 150/100,) 10 x 150 MG & 10 x 100MG  TBPK Take 1 (one) pink tablet (150 mg nirmatrelvir) with 1 (one) tablet (100 mg ritonavir) by mouth twice a day for 5 days. All 2 (two) tablets to be taken together every morning and evening. 08/24/22     propranolol ER (INDERAL LA) 80 MG 24  hr capsule Take 1 capsule (80 mg total) by mouth daily. 09/09/22   Duke Salvia, MD      Allergies    Latex, Other, Tape, Xarelto [rivaroxaban], and Nickel    Review of Systems   Review of Systems  Physical Exam Updated Vital Signs BP (!) 125/109 (BP Location: Right Arm)   Pulse 73   Temp 97.8 F (36.6 C) (Oral)   Resp 16   SpO2 97%  Physical Exam Vitals and nursing note reviewed.  HENT:     Head: Normocephalic.     Comments: Some posterior head tenderness mild swelling no laceration noted    Right Ear: External ear normal.     Left Ear: External ear normal.     Nose: Nose normal.     Mouth/Throat:     Pharynx: Oropharynx is clear.  Eyes:     Extraocular Movements: Extraocular movements intact.     Pupils: Pupils are equal, round, and reactive to light.  Cardiovascular:     Rate and Rhythm: Normal rate and regular rhythm.     Pulses: Normal pulses.  Pulmonary:     Effort: Pulmonary effort is normal.  Abdominal:     General: Abdomen is flat.     Palpations: Abdomen is soft.  Musculoskeletal:  General: Normal range of motion.     Cervical back: Normal range of motion.  Skin:    General: Skin is warm.     Capillary Refill: Capillary refill takes less than 2 seconds.  Neurological:     General: No focal deficit present.     Mental Status: She is alert.  Psychiatric:        Mood and Affect: Mood normal.     ED Results / Procedures / Treatments   Labs (all labs ordered are listed, but only abnormal results are displayed) Labs Reviewed - No data to display  EKG None  Radiology CT Head Wo Contrast  Result Date: 09/10/2022 CLINICAL DATA:  Provided history: Head trauma, minor. Fall (hitting left side of head on concrete). Nausea/vomiting. Unsteady gait. EXAM: CT HEAD WITHOUT CONTRAST CT CERVICAL SPINE WITHOUT CONTRAST TECHNIQUE: Multidetector CT imaging of the head and cervical spine was performed following the standard protocol without intravenous  contrast. Multiplanar CT image reconstructions of the cervical spine were also generated. RADIATION DOSE REDUCTION: This exam was performed according to the departmental dose-optimization program which includes automated exposure control, adjustment of the mA and/or kV according to patient size and/or use of iterative reconstruction technique. COMPARISON:  Head CT 06/10/2021.  Cervical spine CT 03/27/2021. FINDINGS: CT HEAD FINDINGS Brain: Generalized cerebral atrophy. Patchy and ill-defined hypoattenuation within the cerebral white matter, nonspecific but compatible with mild chronic small vessel ischemic disease. There is no acute intracranial hemorrhage. No demarcated cortical infarct. No extra-axial fluid collection. No evidence of an intracranial mass. No midline shift. Vascular: No hyperdense vessel.  Atherosclerotic calcifications. Skull: No calvarial fracture or aggressive osseous lesion. Sinuses/Orbits: No mass or acute finding within the imaged orbits. Postsurgical appearance of the paranasal sinuses. Minimal mucosal thickening within the ethmoid and left maxillary sinuses. Other: Small focus of increased density within the right forehead soft tissues, likely reflecting sequelae of prior hematoma/laceration at this site. Redemonstrated chronic nasal bone fracture deformity. CT CERVICAL SPINE FINDINGS Alignment: Slight C6-C7 and C7-T1 grade 1 anterolisthesis. Skull base and vertebrae: The basion-dental and atlanto-dental intervals are maintained.No evidence of acute fracture to the cervical spine. Facet ankylosis on the left at C2-C3. Soft tissues and spinal canal: Subcentimeter nodule within the left thyroid lobe not meeting consensus criteria for ultrasound follow-up based on size. No follow-up imaging is recommended. Reference: J Am Coll Radiol. 2015 Feb;12(2): 143-50. Disc levels: Cervical spondylosis with multilevel disc space narrowing, disc bulges/central disc protrusions, endplate spurring,  uncovertebral hypertrophy and facet arthrosis. No appreciable high-grade spinal canal stenosis. Uncovertebral hypertrophy results in bony neural foraminal narrowing on the right at C5-C6. Degenerative changes also present at the C1-C2 articulation. Upper chest: No consolidation within the imaged lung apices. No visible pneumothorax. IMPRESSION: CT head: 1. No evidence of an acute intracranial abnormality. 2. Parenchymal atrophy and chronic small vessel ischemic disease. CT cervical spine: 1. No evidence of an acute cervical spine fracture. 2. Mild grade 1 anterolisthesis at C6-C7 and C7-T1. 3. Cervical spondylosis as described. 4. Facet ankylosis on the left at C2-C3. Electronically Signed   By: Jackey Loge D.O.   On: 09/10/2022 14:22   CT Cervical Spine Wo Contrast  Result Date: 09/10/2022 CLINICAL DATA:  Provided history: Head trauma, minor. Fall (hitting left side of head on concrete). Nausea/vomiting. Unsteady gait. EXAM: CT HEAD WITHOUT CONTRAST CT CERVICAL SPINE WITHOUT CONTRAST TECHNIQUE: Multidetector CT imaging of the head and cervical spine was performed following the standard protocol without intravenous contrast. Multiplanar CT image  reconstructions of the cervical spine were also generated. RADIATION DOSE REDUCTION: This exam was performed according to the departmental dose-optimization program which includes automated exposure control, adjustment of the mA and/or kV according to patient size and/or use of iterative reconstruction technique. COMPARISON:  Head CT 06/10/2021.  Cervical spine CT 03/27/2021. FINDINGS: CT HEAD FINDINGS Brain: Generalized cerebral atrophy. Patchy and ill-defined hypoattenuation within the cerebral white matter, nonspecific but compatible with mild chronic small vessel ischemic disease. There is no acute intracranial hemorrhage. No demarcated cortical infarct. No extra-axial fluid collection. No evidence of an intracranial mass. No midline shift. Vascular: No hyperdense  vessel.  Atherosclerotic calcifications. Skull: No calvarial fracture or aggressive osseous lesion. Sinuses/Orbits: No mass or acute finding within the imaged orbits. Postsurgical appearance of the paranasal sinuses. Minimal mucosal thickening within the ethmoid and left maxillary sinuses. Other: Small focus of increased density within the right forehead soft tissues, likely reflecting sequelae of prior hematoma/laceration at this site. Redemonstrated chronic nasal bone fracture deformity. CT CERVICAL SPINE FINDINGS Alignment: Slight C6-C7 and C7-T1 grade 1 anterolisthesis. Skull base and vertebrae: The basion-dental and atlanto-dental intervals are maintained.No evidence of acute fracture to the cervical spine. Facet ankylosis on the left at C2-C3. Soft tissues and spinal canal: Subcentimeter nodule within the left thyroid lobe not meeting consensus criteria for ultrasound follow-up based on size. No follow-up imaging is recommended. Reference: J Am Coll Radiol. 2015 Feb;12(2): 143-50. Disc levels: Cervical spondylosis with multilevel disc space narrowing, disc bulges/central disc protrusions, endplate spurring, uncovertebral hypertrophy and facet arthrosis. No appreciable high-grade spinal canal stenosis. Uncovertebral hypertrophy results in bony neural foraminal narrowing on the right at C5-C6. Degenerative changes also present at the C1-C2 articulation. Upper chest: No consolidation within the imaged lung apices. No visible pneumothorax. IMPRESSION: CT head: 1. No evidence of an acute intracranial abnormality. 2. Parenchymal atrophy and chronic small vessel ischemic disease. CT cervical spine: 1. No evidence of an acute cervical spine fracture. 2. Mild grade 1 anterolisthesis at C6-C7 and C7-T1. 3. Cervical spondylosis as described. 4. Facet ankylosis on the left at C2-C3. Electronically Signed   By: Jackey Loge D.O.   On: 09/10/2022 14:22    Procedures Procedures    Medications Ordered in ED Medications   acetaminophen (TYLENOL) tablet 650 mg (650 mg Oral Given 09/10/22 1255)    ED Course/ Medical Decision Making/ A&P Clinical Course as of 09/10/22 1520  Fri Sep 10, 2022  1431 CT head and cervical spine reviewed interpreted and no evidence of acute abnormality is noted [DR]    Clinical Course User Index [DR] Margarita Grizzle, MD                                 Medical Decision Making Amount and/or Complexity of Data Reviewed Radiology: ordered.  Risk OTC drugs.  89 year old female with mechanical fall presents today complaining of headache.  She has been on blood thinners.  She did strike her head.  She has no other injuries.  She is evaluated here with CT of the head and neck with no intracranial hemorrhage or bleeding noted.  Ambulated and feels like she is at baseline and husband endorses We discussed return precautions and need for follow-up and they voiced understanding.       Final Clinical Impression(s) / ED Diagnoses Final diagnoses:  Fall, initial encounter  Chronic anticoagulation    Rx / DC Orders ED Discharge Orders  None         Margarita Grizzle, MD 09/10/22 [phone]

## 2022-09-13 ENCOUNTER — Emergency Department: Payer: Medicare Other

## 2022-09-13 ENCOUNTER — Inpatient Hospital Stay
Admission: EM | Admit: 2022-09-13 | Discharge: 2022-09-18 | DRG: 958 | Disposition: A | Discharge status: Skilled Nursing Facility | Payer: Medicare Other | Attending: Internal Medicine | Admitting: Internal Medicine

## 2022-09-13 ENCOUNTER — Other Ambulatory Visit: Payer: Self-pay

## 2022-09-13 ENCOUNTER — Encounter: Payer: Self-pay | Admitting: *Deleted

## 2022-09-13 DIAGNOSIS — M978XXA Periprosthetic fracture around other internal prosthetic joint, initial encounter: Secondary | ICD-10-CM

## 2022-09-13 DIAGNOSIS — S42251A Displaced fracture of greater tuberosity of right humerus, initial encounter for closed fracture: Secondary | ICD-10-CM | POA: Diagnosis present

## 2022-09-13 DIAGNOSIS — I10 Essential (primary) hypertension: Secondary | ICD-10-CM | POA: Insufficient documentation

## 2022-09-13 DIAGNOSIS — S5291XA Unspecified fracture of right forearm, initial encounter for closed fracture: Secondary | ICD-10-CM | POA: Diagnosis not present

## 2022-09-13 DIAGNOSIS — I110 Hypertensive heart disease with heart failure: Secondary | ICD-10-CM | POA: Diagnosis present

## 2022-09-13 DIAGNOSIS — E440 Moderate protein-calorie malnutrition: Secondary | ICD-10-CM | POA: Diagnosis present

## 2022-09-13 DIAGNOSIS — W010XXA Fall on same level from slipping, tripping and stumbling without subsequent striking against object, initial encounter: Secondary | ICD-10-CM | POA: Diagnosis present

## 2022-09-13 DIAGNOSIS — Z833 Family history of diabetes mellitus: Secondary | ICD-10-CM

## 2022-09-13 DIAGNOSIS — M9701XA Periprosthetic fracture around internal prosthetic right hip joint, initial encounter: Secondary | ICD-10-CM | POA: Diagnosis present

## 2022-09-13 DIAGNOSIS — S52611A Displaced fracture of right ulna styloid process, initial encounter for closed fracture: Secondary | ICD-10-CM | POA: Diagnosis present

## 2022-09-13 DIAGNOSIS — I480 Paroxysmal atrial fibrillation: Secondary | ICD-10-CM | POA: Diagnosis present

## 2022-09-13 DIAGNOSIS — Z96649 Presence of unspecified artificial hip joint: Secondary | ICD-10-CM

## 2022-09-13 DIAGNOSIS — Z681 Body mass index (BMI) 19 or less, adult: Secondary | ICD-10-CM

## 2022-09-13 DIAGNOSIS — I700 Atherosclerosis of aorta: Secondary | ICD-10-CM | POA: Diagnosis present

## 2022-09-13 DIAGNOSIS — Z950 Presence of cardiac pacemaker: Secondary | ICD-10-CM

## 2022-09-13 DIAGNOSIS — Y92009 Unspecified place in unspecified non-institutional (private) residence as the place of occurrence of the external cause: Secondary | ICD-10-CM

## 2022-09-13 DIAGNOSIS — K5900 Constipation, unspecified: Secondary | ICD-10-CM | POA: Diagnosis not present

## 2022-09-13 DIAGNOSIS — Z8673 Personal history of transient ischemic attack (TIA), and cerebral infarction without residual deficits: Secondary | ICD-10-CM

## 2022-09-13 DIAGNOSIS — F32A Depression, unspecified: Secondary | ICD-10-CM | POA: Diagnosis present

## 2022-09-13 DIAGNOSIS — W19XXXA Unspecified fall, initial encounter: Secondary | ICD-10-CM | POA: Diagnosis not present

## 2022-09-13 DIAGNOSIS — Z823 Family history of stroke: Secondary | ICD-10-CM

## 2022-09-13 DIAGNOSIS — Z66 Do not resuscitate: Secondary | ICD-10-CM | POA: Diagnosis present

## 2022-09-13 DIAGNOSIS — I495 Sick sinus syndrome: Secondary | ICD-10-CM | POA: Diagnosis present

## 2022-09-13 DIAGNOSIS — Z79899 Other long term (current) drug therapy: Secondary | ICD-10-CM

## 2022-09-13 DIAGNOSIS — Z91048 Other nonmedicinal substance allergy status: Secondary | ICD-10-CM

## 2022-09-13 DIAGNOSIS — F419 Anxiety disorder, unspecified: Secondary | ICD-10-CM | POA: Diagnosis present

## 2022-09-13 DIAGNOSIS — S62101A Fracture of unspecified carpal bone, right wrist, initial encounter for closed fracture: Secondary | ICD-10-CM

## 2022-09-13 DIAGNOSIS — D696 Thrombocytopenia, unspecified: Secondary | ICD-10-CM | POA: Diagnosis not present

## 2022-09-13 DIAGNOSIS — S32591A Other specified fracture of right pubis, initial encounter for closed fracture: Secondary | ICD-10-CM | POA: Diagnosis present

## 2022-09-13 DIAGNOSIS — S72114A Nondisplaced fracture of greater trochanter of right femur, initial encounter for closed fracture: Secondary | ICD-10-CM | POA: Diagnosis present

## 2022-09-13 DIAGNOSIS — Z96651 Presence of right artificial knee joint: Secondary | ICD-10-CM | POA: Diagnosis present

## 2022-09-13 DIAGNOSIS — S52201A Unspecified fracture of shaft of right ulna, initial encounter for closed fracture: Secondary | ICD-10-CM | POA: Diagnosis present

## 2022-09-13 DIAGNOSIS — Z8249 Family history of ischemic heart disease and other diseases of the circulatory system: Secondary | ICD-10-CM | POA: Diagnosis not present

## 2022-09-13 DIAGNOSIS — Z96611 Presence of right artificial shoulder joint: Secondary | ICD-10-CM | POA: Diagnosis present

## 2022-09-13 DIAGNOSIS — Z91040 Latex allergy status: Secondary | ICD-10-CM

## 2022-09-13 DIAGNOSIS — S52551A Other extraarticular fracture of lower end of right radius, initial encounter for closed fracture: Secondary | ICD-10-CM | POA: Diagnosis present

## 2022-09-13 DIAGNOSIS — Z888 Allergy status to other drugs, medicaments and biological substances status: Secondary | ICD-10-CM

## 2022-09-13 DIAGNOSIS — S72001A Fracture of unspecified part of neck of right femur, initial encounter for closed fracture: Secondary | ICD-10-CM | POA: Diagnosis present

## 2022-09-13 DIAGNOSIS — Z7901 Long term (current) use of anticoagulants: Secondary | ICD-10-CM | POA: Diagnosis not present

## 2022-09-13 LAB — CBC
HCT: 36 % (ref 36.0–46.0)
Hemoglobin: 11.9 g/dL — ABNORMAL LOW (ref 12.0–15.0)
MCH: 31.5 pg (ref 26.0–34.0)
MCHC: 33.1 g/dL (ref 30.0–36.0)
MCV: 95.2 fL (ref 80.0–100.0)
Platelets: 160 10*3/uL (ref 150–400)
RBC: 3.78 MIL/uL — ABNORMAL LOW (ref 3.87–5.11)
RDW: 13.9 % (ref 11.5–15.5)
WBC: 10 10*3/uL (ref 4.0–10.5)
nRBC: 0 % (ref 0.0–0.2)

## 2022-09-13 LAB — URINALYSIS, ROUTINE W REFLEX MICROSCOPIC
Bilirubin Urine: NEGATIVE
Glucose, UA: NEGATIVE mg/dL
Hgb urine dipstick: NEGATIVE
Ketones, ur: 5 mg/dL — AB
Leukocytes,Ua: NEGATIVE
Nitrite: NEGATIVE
Protein, ur: 30 mg/dL — AB
Specific Gravity, Urine: 1.017 (ref 1.005–1.030)
pH: 6 (ref 5.0–8.0)

## 2022-09-13 LAB — BASIC METABOLIC PANEL
Anion gap: 11 (ref 5–15)
BUN: 32 mg/dL — ABNORMAL HIGH (ref 8–23)
CO2: 25 mmol/L (ref 22–32)
Calcium: 8.9 mg/dL (ref 8.9–10.3)
Chloride: 98 mmol/L (ref 98–111)
Creatinine, Ser: 1.02 mg/dL — ABNORMAL HIGH (ref 0.44–1.00)
GFR, Estimated: 53 mL/min — ABNORMAL LOW (ref >60)
Glucose, Bld: 133 mg/dL — ABNORMAL HIGH (ref 70–99)
Potassium: 4 mmol/L (ref 3.5–5.1)
Sodium: 134 mmol/L — ABNORMAL LOW (ref 135–145)

## 2022-09-13 LAB — TROPONIN I (HIGH SENSITIVITY)
Troponin I (High Sensitivity): 14 ng/L (ref <18)
Troponin I (High Sensitivity): 9 ng/L (ref <18)

## 2022-09-13 LAB — PROTIME-INR
INR: 1.2 (ref 0.8–1.2)
Prothrombin Time: 15.8 seconds — ABNORMAL HIGH (ref 11.4–15.2)

## 2022-09-13 MED ORDER — FENTANYL CITRATE PF 50 MCG/ML IJ SOSY
50.0000 ug | PREFILLED_SYRINGE | Freq: Once | INTRAMUSCULAR | Status: AC
  Administered 2022-09-13: 50 ug via INTRAVENOUS
  Filled 2022-09-13: qty 1

## 2022-09-13 MED ORDER — LIDOCAINE HCL (PF) 1 % IJ SOLN
15.0000 mL | Freq: Once | INTRAMUSCULAR | Status: AC
  Administered 2022-09-13: 15 mL
  Filled 2022-09-13: qty 15

## 2022-09-13 NOTE — ED Provider Notes (Signed)
[facility] Provider Note    Event Date/Time   First MD Initiated Contact with Patient 09/13/22 2221     (approximate)   History   Hip Pain   HPI  Alejandra Wiley is a 89 y.o. female past medical history significant for atrial fibrillation on Eliquis, who presents to the emergency department following a fall.  Patient states that she slipped on a pillow that was laying on the floor causing her to fall.  Denies any head injury.  Complaining of pain to her right hip and right wrist.  Did take her Eliquis today.  Prior hip replacement.  Denies any chest pain or shortness of breath.     Physical Exam   Triage Vital Signs: ED Triage Vitals  Encounter Vitals Group     BP 09/13/22 2202 (!) 126/109     Systolic BP Percentile --      Diastolic BP Percentile --      Pulse Rate 09/13/22 2202 69     Resp 09/13/22 2202 18     Temp 09/13/22 2202 97.8 F (36.6 C)     Temp Source 09/13/22 2202 Oral     SpO2 09/13/22 2202 99 %     Weight 09/13/22 1959 119 lb 0.8 oz (54 kg)     Height 09/13/22 1959 5\' 6"  (1.676 m)     Head Circumference --      Peak Flow --      Pain Score 09/13/22 1958 8     Pain Loc --      Pain Education --      Exclude from Growth Chart --     Most recent vital signs: Vitals:   09/13/22 2202  BP: (!) 126/109  Pulse: 69  Resp: 18  Temp: 97.8 F (36.6 C)  SpO2: 99%    Physical Exam Constitutional:      Appearance: She is well-developed.  HENT:     Head: Atraumatic.  Eyes:     Conjunctiva/sclera: Conjunctivae normal.  Cardiovascular:     Rate and Rhythm: Regular rhythm.  Pulmonary:     Effort: No respiratory distress.  Abdominal:     General: There is no distension.  Musculoskeletal:     Cervical back: Normal range of motion and neck supple. No tenderness.     Comments: Tenderness palpation to the right hip.  Obvious deformity to the right forearm with no open wounds.  +2 radial and DP pulses that are equal bilaterally.   No tenderness of the left hip or lower extremity.  No tenderness to left upper extremity.  No midline thoracic or lumbar tenderness to palpation.  Skin:    General: Skin is warm.  Neurological:     Mental Status: She is alert. Mental status is at baseline.     IMPRESSION / MDM / ASSESSMENT AND PLAN / ED COURSE  I reviewed the triage vital signs and the nursing notes.  Differential diagnosis including fracture, dislocation, musculoskeletal strain.  On chart review patient has had frequent falls and was last evaluated 3 days ago.  RADIOLOGY  my interpretation of imaging: Right wrist with displaced distal radius and ulna fracture.  Right hip fracture with acute periprosthetic femur fracture extending through the greater trochanter and acute minimally displaced right superior and inferior pubic ramus fractures.  LABS (all labs ordered are listed, but only abnormal results are displayed) Labs interpreted as -    Labs Reviewed  CBC - Abnormal; Notable for the following  components:      Result Value   RBC 3.78 (*)    Hemoglobin 11.9 (*)    All other components within normal limits  BASIC METABOLIC PANEL - Abnormal; Notable for the following components:   Sodium 134 (*)    Glucose, Bld 133 (*)    BUN 32 (*)    Creatinine, Ser 1.02 (*)    GFR, Estimated 53 (*)    All other components within normal limits  PROTIME-INR - Abnormal; Notable for the following components:   Prothrombin Time 15.8 (*)    All other components within normal limits  URINALYSIS, ROUTINE W REFLEX MICROSCOPIC - Abnormal; Notable for the following components:   Color, Urine YELLOW (*)    APPearance CLEAR (*)    Ketones, ur 5 (*)    Protein, ur 30 (*)    Bacteria, UA MANY (*)    All other components within normal limits  TROPONIN I (HIGH SENSITIVITY)  TROPONIN I (HIGH SENSITIVITY)     MDM  Patient was given IV pain medication.  Lab work overall reassuring with no significant electrolyte abnormalities.  No  signs of urinary tract infection.  Initial troponin is negative, have a low suspicion for ACS.  Nonsyncopal fall.  Discussed with orthopedics Dr. Rosann Auerbach who reviewed her imaging, recommended admission to the hospitalist service and they will evaluate her tomorrow.  Dr. Katrinka Blazing assisted with right distal radius and ulna fracture, see separate note for procedure details.  Consulted hospitalist for admission. Clinical Course as of 09/14/22 0006  Tue Sep 14, 2022  0005 Hematoma block and hanging [DS]    Clinical Course User Index [DS] Delton Prairie, MD     PROCEDURES:  Critical Care performed: No  Procedures  Patient's presentation is most consistent with acute presentation with potential threat to life or bodily function.   MEDICATIONS ORDERED IN ED: Medications  fentaNYL (SUBLIMAZE) injection 50 mcg (50 mcg Intravenous Given 09/13/22 2255)  lidocaine (PF) (XYLOCAINE) 1 % injection 15 mL (15 mLs Other Given 09/13/22 2322)    FINAL CLINICAL IMPRESSION(S) / ED DIAGNOSES   Final diagnoses:  Fall, initial encounter  Periprosthetic fracture of proximal end of femur  Closed fracture of right radius and ulna, initial encounter     Rx / DC Orders   ED Discharge Orders     None        Note:  This document was prepared using Dragon voice recognition software and may include unintentional dictation errors.   Corena Herter, MD 09/14/22 [phone]

## 2022-09-13 NOTE — ED Triage Notes (Signed)
Pt brought in via ems from [place] [facility], lives with husband   pt fell tonight and has right hip pain.   Pt also has right wrist pain.  Swelling noted.   No loc.  Iv in place fentanyl  given by ems en route.  Pt on 2 liters oxygen [place].  Pt on stretcher in triage.

## 2022-09-14 ENCOUNTER — Inpatient Hospital Stay: Payer: Medicare Other

## 2022-09-14 DIAGNOSIS — S62101A Fracture of unspecified carpal bone, right wrist, initial encounter for closed fracture: Secondary | ICD-10-CM

## 2022-09-14 DIAGNOSIS — I480 Paroxysmal atrial fibrillation: Secondary | ICD-10-CM

## 2022-09-14 DIAGNOSIS — S5291XA Unspecified fracture of right forearm, initial encounter for closed fracture: Secondary | ICD-10-CM

## 2022-09-14 DIAGNOSIS — S52201A Unspecified fracture of shaft of right ulna, initial encounter for closed fracture: Secondary | ICD-10-CM

## 2022-09-14 DIAGNOSIS — S72001D Fracture of unspecified part of neck of right femur, subsequent encounter for closed fracture with routine healing: Secondary | ICD-10-CM

## 2022-09-14 DIAGNOSIS — I10 Essential (primary) hypertension: Secondary | ICD-10-CM | POA: Insufficient documentation

## 2022-09-14 DIAGNOSIS — Y92009 Unspecified place in unspecified non-institutional (private) residence as the place of occurrence of the external cause: Secondary | ICD-10-CM

## 2022-09-14 DIAGNOSIS — F32A Depression, unspecified: Secondary | ICD-10-CM | POA: Insufficient documentation

## 2022-09-14 LAB — CBC
HCT: 29.9 % — ABNORMAL LOW (ref 36.0–46.0)
Hemoglobin: 9.9 g/dL — ABNORMAL LOW (ref 12.0–15.0)
MCH: 31.4 pg (ref 26.0–34.0)
MCHC: 33.1 g/dL (ref 30.0–36.0)
MCV: 94.9 fL (ref 80.0–100.0)
Platelets: 137 10*3/uL — ABNORMAL LOW (ref 150–400)
RBC: 3.15 MIL/uL — ABNORMAL LOW (ref 3.87–5.11)
RDW: 14 % (ref 11.5–15.5)
WBC: 12.5 10*3/uL — ABNORMAL HIGH (ref 4.0–10.5)
nRBC: 0 % (ref 0.0–0.2)

## 2022-09-14 LAB — BASIC METABOLIC PANEL
Anion gap: 9 (ref 5–15)
BUN: 35 mg/dL — ABNORMAL HIGH (ref 8–23)
CO2: 25 mmol/L (ref 22–32)
Calcium: 8.4 mg/dL — ABNORMAL LOW (ref 8.9–10.3)
Chloride: 102 mmol/L (ref 98–111)
Creatinine, Ser: 0.94 mg/dL (ref 0.44–1.00)
GFR, Estimated: 59 mL/min — ABNORMAL LOW (ref >60)
Glucose, Bld: 154 mg/dL — ABNORMAL HIGH (ref 70–99)
Potassium: 4.8 mmol/L (ref 3.5–5.1)
Sodium: 136 mmol/L (ref 135–145)

## 2022-09-14 MED ORDER — SODIUM CHLORIDE 0.9 % IV SOLN: INTRAVENOUS | Status: DC

## 2022-09-14 MED ORDER — AMLODIPINE BESYLATE 5 MG PO TABS
2.5000 mg | ORAL_TABLET | Freq: Every day | ORAL | Status: DC
  Administered 2022-09-14 – 2022-09-18 (×4): 2.5 mg via ORAL
  Filled 2022-09-14 (×4): qty 1

## 2022-09-14 MED ORDER — TRAZODONE HCL 50 MG PO TABS: 25.0000 mg | ORAL_TABLET | Freq: Every evening | ORAL | Status: DC | PRN

## 2022-09-14 MED ORDER — HYDROCODONE-ACETAMINOPHEN 5-325 MG PO TABS
1.0000 | ORAL_TABLET | Freq: Four times a day (QID) | ORAL | Status: DC | PRN
  Administered 2022-09-14 – 2022-09-16 (×4): 1 via ORAL
  Administered 2022-09-16: 2 via ORAL
  Administered 2022-09-17 (×2): 1 via ORAL
  Administered 2022-09-18: 2 via ORAL
  Administered 2022-09-18: 1 via ORAL
  Filled 2022-09-14 (×2): qty 1
  Filled 2022-09-14: qty 2
  Filled 2022-09-14 (×2): qty 1
  Filled 2022-09-14: qty 2
  Filled 2022-09-14 (×2): qty 1
  Filled 2022-09-14: qty 2

## 2022-09-14 MED ORDER — AMIODARONE HCL 200 MG PO TABS
200.0000 mg | ORAL_TABLET | Freq: Every day | ORAL | Status: DC
  Administered 2022-09-14 – 2022-09-18 (×4): 200 mg via ORAL
  Filled 2022-09-14 (×4): qty 1

## 2022-09-14 MED ORDER — ONDANSETRON HCL 4 MG/2ML IJ SOLN: 4.0000 mg | INTRAMUSCULAR | Status: DC | PRN

## 2022-09-14 MED ORDER — ACETAMINOPHEN 325 MG PO TABS: 650.0000 mg | ORAL_TABLET | ORAL | Status: DC | PRN

## 2022-09-14 MED ORDER — MAGNESIUM HYDROXIDE 400 MG/5ML PO SUSP
30.0000 mL | Freq: Every day | ORAL | Status: DC | PRN
  Administered 2022-09-14: 30 mL via ORAL
  Filled 2022-09-14 (×3): qty 30

## 2022-09-14 MED ORDER — MORPHINE SULFATE (PF) 2 MG/ML IV SOLN
0.5000 mg | INTRAVENOUS | Status: DC | PRN
  Administered 2022-09-14: 0.5 mg via INTRAVENOUS
  Filled 2022-09-14: qty 1

## 2022-09-14 MED ORDER — PROPRANOLOL HCL ER 80 MG PO CP24
80.0000 mg | ORAL_CAPSULE | Freq: Every day | ORAL | Status: DC
  Administered 2022-09-14 – 2022-09-18 (×4): 80 mg via ORAL
  Filled 2022-09-14 (×5): qty 1

## 2022-09-14 MED ORDER — ENOXAPARIN SODIUM 40 MG/0.4ML IJ SOSY
40.0000 mg | PREFILLED_SYRINGE | INTRAMUSCULAR | Status: DC
  Administered 2022-09-14 (×2): 40 mg via SUBCUTANEOUS
  Filled 2022-09-14 (×2): qty 0.4

## 2022-09-14 MED ORDER — ENSURE ENLIVE PO LIQD
237.0000 mL | Freq: Two times a day (BID) | ORAL | Status: DC
  Administered 2022-09-16 – 2022-09-18 (×5): 237 mL via ORAL

## 2022-09-14 MED ORDER — ADULT MULTIVITAMIN W/MINERALS CH
1.0000 | ORAL_TABLET | Freq: Every day | ORAL | Status: DC
  Administered 2022-09-16 – 2022-09-18 (×3): 1 via ORAL
  Filled 2022-09-14 (×3): qty 1

## 2022-09-14 NOTE — Plan of Care (Signed)

## 2022-09-14 NOTE — Assessment & Plan Note (Signed)
-  We will continue her antihypertensive therapy 

## 2022-09-14 NOTE — Consult Note (Signed)
ORTHOPAEDIC CONSULTATION  REQUESTING PHYSICIAN: Tresa Moore, MD  Chief Complaint:   Right wrist and right hip pain.  History of Present Illness: Alejandra Wiley is an 89 y.o. 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  The patient was in her usual state of health until yesterday when she slipped on a throw pillow in her bathroom and landed heavily on her right side, injuring her right hip and her right wrist.  She presented to the emergency room where x-rays demonstrated a displaced right distal radius and ulnar fracture.  An attempt was made to do reduce this fracture by the ER provider.  The post-reduction films showed improvement in the fracture alignment but the reduction was still suboptimal.  The patient also had x-rays of the pelvis and right hip which demonstrated a nondisplaced greater trochanteric fracture around a well-fixed right hip hemiarthroplasty implant.  The patient denies any lightheadedness, dizziness, chest pain, shortness of breath, or other symptoms which may have precipitated her fall.  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 Rosita Kea during a back surgery in approx 2019.   Hypertension    Long term current use of anticoagulant    a.) dose reduced apixaban   MRSA nasal colonization 09/17/2016   Pacemaker 12/06/2003   Medtronic Impulse  [id number]   Pacemaker 2015   PAF (paroxysmal atrial fibrillation) (HCC)    a.) CHA2DS2-VASc = 8 (age x 2, sex, CHF, HTN, CVA x 2, aortic plaque). b.) rate/rhythm maintained on oral amiodarone + metoprolol succinate; chronically anticoagulated with dose reduced apixaban   Sick sinus syndrome (HCC)    Sinoatrial node dysfunction (HCC)    Stroke (HCC)    Vaginal intraepithelial neoplasia    VAIN (vaginal intraepithelial neoplasia)    Past Surgical History:  Procedure Laterality Date   APPENDECTOMY     BICEPT TENODESIS Right 04/09/2021   Procedure: BICEPS TENODESIS;  Surgeon: Signa Kell, MD;  Location: [facility] ORS;  Service: Orthopedics;  Laterality: Right;   BREAST BIOPSY Left 05/12/2001   moderate epithelial hyperplasia without atypia and fibrocystic changes   COLONOSCOPY     HERNIA REPAIR     lower abd   IMAGE GUIDED SINUS SURGERY  2015   INSERT / REPLACE / REMOVE PACEMAKER  12/06/2003   Medtronic impulse [id number]   JOINT REPLACEMENT Right    knee   KYPHOPLASTY N/A 06/23/2017   Procedure: [id number];  Surgeon: Kennedy Bucker, MD;  Location: [facility] ORS;  Service: Orthopedics;  Laterality: N/A;   LEEP  1999   ORIF HIP FRACTURE Right 02/2016   PACEMAKER GENERATOR CHANGE N/A 09/18/2012   Procedure: PACEMAKER GENERATOR CHANGE;  Surgeon: Duke Salvia, MD;  Location: [facility] CATH LAB;  Service: Cardiovascular;  Laterality: N/A;   REVERSE SHOULDER ARTHROPLASTY Right 04/09/2021   Procedure: Right reverse shoulder arthroplasty for fracture;  Surgeon: Signa Kell, MD;  Location: [facility] ORS;  Service: Orthopedics;  Laterality: Right;   TONSILLECTOMY  TOTAL KNEE ARTHROPLASTY     TOTAL KNEE REVISION Right 09/23/2016   Procedure: TOTAL KNEE REVISION-POLYETHYLENE EXCHANGE;  Surgeon: Kennedy Bucker, MD;  Location: [facility] ORS;  Service: Orthopedics;  Laterality: Right;   VAGINAL HYSTERECTOMY     Social History   Socioeconomic History   Marital status: Married    Spouse name: Jillyn Hidden   Number of children:  Not on file   Years of education: Not on file   Highest education level: Not on file  Occupational History   Occupation: Part time  Tobacco Use   Smoking status: Never   Smokeless tobacco: Never  Vaping Use   Vaping status: Never Used  Substance and Sexual Activity   Alcohol use: Yes    Comment: 3 oz wine daily   Drug use: No   Sexual activity: Not Currently  Other Topics Concern   Not on file  Social History Narrative   Pt gets regular exercise.   Social Determinants of Health   Financial Resource Strain: Low Risk  (05/18/2022)   Received from [facility], [facility]   Overall Financial Resource Strain (CARDIA)    Difficulty of Paying Living Expenses: Not hard at all  Food Insecurity: No Food Insecurity (09/14/2022)   Hunger Vital Sign    Worried About Running Out of Food in the Last Year: Never true    Ran Out of Food in the Last Year: Never true  Transportation Needs: No Transportation Needs (09/14/2022)   PRAPARE - Administrator, Civil Service (Medical): No    Lack of Transportation (Non-Medical): No  Physical Activity: Not on file  Stress: Not on file  Social Connections: Not on file   Family History  Problem Relation Age of Onset   Stroke Mother    Heart disease Father    Diabetes Daughter    Diabetes Son    Allergies  Allergen Reactions   Latex Other (See Comments)    blisters   Other Other (See Comments)    Blisters/ paper tape is Education administrator paper tape is Education administrator paper tape is Musician paper tape is Ok   Sport and exercise psychologist (See Comments)    Blisters/ paper tape is Education administrator paper tape is Ok   Xarelto [Rivaroxaban] Other (See Comments)    Gums bleeding and too much bruising   Nickel Rash and Other (See Comments)   Prior to Admission medications   Medication Sig Start Date End Date Taking? Authorizing Provider  acetaminophen (TYLENOL) 500 MG tablet Take 500 mg by mouth every 8 (eight) hours as  needed for mild pain or headache.    [provider]  amiodarone (PACERONE) 200 MG tablet Take 1 tablet by mouth 5 days a week 03/23/22   Duke Salvia, MD  amLODipine (NORVASC) 2.5 MG tablet TAKE 1 TABLET DAILY 06/02/22   Duke Salvia, MD  ELIQUIS 2.5 MG TABS tablet TAKE 1 TABLET TWICE A DAY 12/01/21   Duke Salvia, MD  ibandronate (BONIVA) 150 MG tablet Take 150 mg by mouth every 30 (thirty) days. Take in the morning with a full glass of water, on an empty stomach, and do not take anything else by mouth or lie down for the next 30 min.    [provider]  Multiple Vitamins-Minerals (SENIOR MULTIVITAMIN PLUS PO) Take 1 tablet by mouth daily.    [provider]  nirmatrelvir & ritonavir (PAXLOVID, 150/100,) 10 x 150 MG & 10 x  100MG  TBPK Take 1 (one) pink tablet (150 mg nirmatrelvir) with 1 (one) tablet (100 mg ritonavir) by mouth twice a day for 5 days. All 2 (two) tablets to be taken together every morning and evening. 08/24/22     propranolol ER (INDERAL LA) 80 MG 24 hr capsule Take 1 capsule (80 mg total) by mouth daily. 09/09/22   Duke Salvia, MD   DG Wrist 2 Views Right  Result Date: 09/14/2022 CLINICAL DATA:  Splint placement EXAM: RIGHT WRIST - 2 VIEW COMPARISON:  09/13/2022 FINDINGS: Improved alignment of radius and ulna fractures, with mild residual dorsal displacement at the radius fracture site. IMPRESSION: Improved alignment of radius and ulna fractures status post splint placement. Electronically Signed   By: Deatra Robinson M.D.   On: 09/14/2022 00:54   DG Hip Unilat W or Wo Pelvis 2-3 Views Right  Result Date: 09/13/2022 CLINICAL DATA:  Right hip pain, fell EXAM: DG HIP (WITH OR WITHOUT PELVIS) 2-3V RIGHT COMPARISON:  09/12/2017 FINDINGS: Frontal view of the pelvis as well as frontal and cross-table lateral views of the right hip are obtained. Right hip and knee arthroplasty is again identified, with a periprosthetic proximal right femur fracture  identified along the greater trochanter. Soft tissue swelling overlies the right hip. Minimally displaced fractures are also seen through the right superior and inferior pubic rami. No other acute pelvic fractures. Stable left hip osteoarthritis. IMPRESSION: 1. Acute periprosthetic proximal right femur fracture, with fracture line extending through the greater trochanter. 2. Acute minimally displaced right superior and inferior pubic rami fractures. 3. Left hip osteoarthritis. Electronically Signed   By: Sharlet Salina M.D.   On: 09/13/2022 21:18   DG Wrist Complete Right  Result Date: 09/13/2022 CLINICAL DATA:  Trauma to the right wrist. EXAM: RIGHT WRIST - COMPLETE 3+ VIEW COMPARISON:  None Available. FINDINGS: Displaced fractures of the distal radius and ulna with dorsal angulation and displacement of the distal fracture fragments. The distal radial fracture appears to extend into the articular surface of the wrist. There is also fracture of the ulnar styloid. No dislocation. There is soft tissue swelling of the wrist. No opaque foreign object/catheterization. IMPRESSION: Displaced fractures of the distal radius and ulna. Electronically Signed   By: Elgie Collard M.D.   On: 09/13/2022 21:17    Positive ROS: All other systems have been reviewed and were otherwise negative with the exception of those mentioned in the HPI and as above.  Physical Exam: General:  Alert, no acute distress Psychiatric:  Patient is competent for consent with normal mood and affect   Cardiovascular:  No pedal edema Respiratory:  No wheezing, non-labored breathing GI:  Abdomen is soft and non-tender Skin:  No lesions in the area of chief complaint Neurologic:  Sensation intact distally Lymphatic:  No axillary or cervical lymphadenopathy  Orthopedic Exam:  Orthopedic examination is limited to the right forearm and hand.  The patient is in a sugar-tong splint which appears to be in good condition.  Skin is intact at the  proximal distal margins of the splint.  There is moderate swelling of all digits.  She is able to actively flex and extend all digits, although motion is limited secondary to pain and apprehension.  She has intact sensation light touch to all digits.  She has good capillary refill to all digits.  Orthopedic examination also is limited to the right hip and lower extremity.  Skin inspection around the right hip is notable for mild swelling and ecchymosis  laterally, as well as a well-healed surgical incision.  She experiences moderate tenderness to palpation over the lateral aspect of the hip.  She has more severe pain with any attempted active or passive motion of the hip.  She is grossly neurovascularly intact to the right lower extremity and foot.  X-rays:  Recent pre- and post-reduction x-rays of the right hip are available for review and have been reviewed by myself.  Although the alignment is improved as compared to the initial films, the postreduction films demonstrate a displaced right distal radius fracture with some displacement of the distal ulna fracture as well.  No significant degenerative changes of the radiocarpal joint are noted.  No lytic lesions or other acute bony processes are identified.  Recent x-rays of the pelvis and right hip also are available for review and have been reviewed by myself.  These films demonstrate an essentially nondisplaced fracture of the greater trochanter around what appears to be a well fixed right hip hemiarthroplasty implant.  No lytic lesions or other acute bony processes are identified.  Assessment: 1.  Displaced right distal radius and ulnar fracture. 2.  Nondisplaced periprosthetic greater trochanteric fracture, right hip.  Plan: The treatment options have been discussed with the patient and her husband, who is at the bedside.  For the right hip, I feel that this fracture can be managed nonsurgically with protected weightbearing until the fracture has a  chance to heal, given that the hemiarthroplasty implant appears to be well-fixed and functioning well.  Given her associated right wrist injury, she will need to use a platform walker to help her mobilize with physical therapy while protecting her right hip.  Regarding her right distal radius fracture, the options of surgical versus nonsurgical management were discussed, as were the potential pros and cons of each option.  The patient would like to proceed with surgical intervention to include an open reduction and internal fixation of the right distal radius fracture as this will help her to regain good function more quickly, especially as she is right-handed.  This procedure was discussed in detail with the patient and her husband, as were the potential risks (including bleeding, infection, nerve and/or blood vessel injury, persistent or recurrent pain, stiffness of the wrist, malunion and/or nonunion, need for further surgery, blood clots, strokes, heart attacks and/or arrhythmias, etc.) and benefits.  The patient states her understanding and wishes to proceed.  A formal written consent will be obtained by the nursing staff.    Thank you for asking me to participate in the care of this most pleasant and unfortunate woman.  I will be happy to follow her with you.   Alejandra Amos, MD  Beeper #:  [phone]  09/14/2022 5:10 PM

## 2022-09-14 NOTE — Assessment & Plan Note (Signed)
-   We will continue Wellbutrin XL. 

## 2022-09-14 NOTE — Assessment & Plan Note (Signed)
-   We will continue to run but hold off Eliquis for now.

## 2022-09-14 NOTE — Assessment & Plan Note (Signed)
-   This was a mechanical fall. - She will have physical therapy and case management evaluation for the possibility of SNF placement.

## 2022-09-14 NOTE — Plan of Care (Signed)

## 2022-09-14 NOTE — Progress Notes (Signed)
Initial Nutrition Assessment  DOCUMENTATION CODES:   Non-severe (moderate) malnutrition in context of social or environmental circumstances  INTERVENTION:   Ensure Enlive po BID, each supplement provides 350 kcal and 20 grams of protein.  Magic cup TID with meals, each supplement provides 290 kcal and 9 grams of protein  MVI po daily   Liberalize diet   Assist with meals  Pt at high refeed risk; recommend monitor potassium, magnesium and phosphorus labs daily until stable  Daily weights   NUTRITION DIAGNOSIS:   Moderate Malnutrition related to social / environmental circumstances as evidenced by moderate fat depletion, moderate muscle depletion.  GOAL:   Patient will meet greater than or equal to 90% of their needs  MONITOR:   PO intake, Supplement acceptance, Labs, Weight trends, Skin, I & O's  REASON FOR ASSESSMENT:   Consult Hip fracture protocol  ASSESSMENT:   89 y/o female with h/o HTN, depression, PAF, pacemaker, CHF, stroke, anxiety and recent COVID 19 who is admitted with hip and wrist fractures after fall.  Met with pt in room today. Pt reports decreased appetite and oral intake at baseline. Pt reports that a few weeks ago, she was diagnosed with COVID and ever since then her oral intake has been poor. Pt reports that she has lost at least 5lbs over the past month. Per chart, pt is down 5lbs(4%) since March; this is not significant. Pt reports eating part of an English muffin and drank some milk for breakfast. Pt eating lunch at time of RD visit. Pt reports that at baseline, she does not eat a lot of meat. Pt reports that she drinks Ensure at home but not regularly. Pt loves milk. RD discussed with pt the importance of adequate nutrition needed to preserve lean muscle and to support healing. RD will request meal assist to help pt with setting up her trays. RD will also request for tough foods to be chopped up. RD will add supplements to help pt meet her estimated  needs. Pt is likely at refeed risk. Plan is for medical management for now.    Medications reviewed and include: lovenox, NaCl @100ml /hr   Labs reviewed: K 4.8 wnl, BUN 35(H) Wbc- 12.5(H), Hgb 9.9(L), Hct 29.9(L)  NUTRITION - FOCUSED PHYSICAL EXAM:  Flowsheet Row Most Recent Value  Orbital Region No depletion  Upper Arm Region Moderate depletion  Thoracic and Lumbar Region Moderate depletion  Buccal Region No depletion  Temple Region Mild depletion  Clavicle Bone Region Moderate depletion  Clavicle and Acromion Bone Region Moderate depletion  Scapular Bone Region Moderate depletion  Dorsal Hand Moderate depletion  Patellar Region Moderate depletion  Anterior Thigh Region Moderate depletion  Posterior Calf Region Moderate depletion  Edema (RD Assessment) Mild  Hair Reviewed  Eyes Reviewed  Mouth Reviewed  Skin Reviewed  Nails Reviewed   Diet Order:   Diet Order             Diet Heart Room service appropriate? Yes; Fluid consistency: Thin  Diet effective now                  EDUCATION NEEDS:   Education needs have been addressed  Skin:  Skin Assessment: Reviewed RN Assessment  Last BM:  8/26  Height:   Ht Readings from Last 1 Encounters:  09/13/22 5\' 6"  (1.676 m)    Weight:   Wt Readings from Last 1 Encounters:  09/14/22 57.9 kg    Ideal Body Weight:  59 kg  BMI:  Body mass index is 20.6 kg/m.  Estimated Nutritional Needs:   Kcal:  1400-1600kcal/day  Protein:  70-80g/day  Fluid:  1.4-1.6L/day  Betsey Holiday MS, RD, LDN Please refer to [facility] for RD and/or RD on-call/weekend/after hours pager

## 2022-09-14 NOTE — H&P (Signed)
[place]   PATIENT NAME: Alejandra Wiley    MR#:  [id number]  DATE OF BIRTH:  1932/05/15  DATE OF ADMISSION:  09/13/2022  PRIMARY CARE PHYSICIAN: Lauro Regulus, MD   Patient is coming from: Home  REQUESTING/REFERRING PHYSICIAN: Corena Herter, MD  CHIEF COMPLAINT:   Chief Complaint  Patient presents with   Hip Pain    HISTORY OF PRESENT ILLNESS:  Alejandra Wiley is a 89 y.o. Caucasian female with medical history significant for anxiety, hypertension, CHF, aortic atherosclerosis, CVA, paroxysmal atrial fibrillation on Eliquis, and anxiety, who presented to the emergency room with acute onset of accidental mechanical fall.  The patient slipped and fell on her right side with subsequent right hip and wrist pain.  She admits to nausea without vomiting.  She experienced sharp chest pain earlier that resolved.  No dysuria, oliguria or hematuria or flank pain.  No dyspnea or cough or wheezing or hemoptysis.  No other bleeding diathesis.  She denies any presyncope or syncope.  She denies any paresthesias or focal muscle weakness.  ED Course: When she came to the ER, BP was 126/109 with otherwise normal vital signs.  Labs revealed sodium 134 and BUN of 32 with creatinine 1.02.  High sensitive troponin I was 14 and later 9.  CBC showed hemoglobin 11.9 hematocrit 36 with WBC of 10.  PT was 15.8 with INR 1.2 and UA showed many bacteria with 0-5 WBCs and 6-10 RBCs with positive hyaline casts.  EKG as reviewed by me : Pending Imaging: Right wrist x-ray showed this based fractures of the distal radius and ulna.  Repeat x-rays pending after reduction.  Right hip x-ray showed the following: 1. Acute periprosthetic proximal right femur fracture, with fracture line extending through the greater trochanter. 2. Acute minimally displaced right superior and inferior pubic rami fractures. 3. Left hip osteoarthritis.  The patient was given 50 mcg of IV fentanyl and was ordered 1% lidocaine  for reduction.  She will be admitted to a medical bed for further evaluation and management. 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 Rosita Kea during a back surgery in approx 2019.   Hypertension    Long term current use of anticoagulant    a.) dose reduced apixaban   MRSA nasal colonization 09/17/2016   Pacemaker 12/06/2003   Medtronic Impulse [id number]   Pacemaker 2015   PAF (paroxysmal atrial fibrillation) (HCC)    a.) CHA2DS2-VASc = 8 (age x 2, sex, CHF, HTN, CVA x 2, aortic plaque). b.) rate/rhythm maintained on oral amiodarone + metoprolol succinate; chronically anticoagulated with dose reduced apixaban   Sick sinus syndrome (HCC)    Sinoatrial node dysfunction (HCC)    Stroke (HCC)    Vaginal intraepithelial neoplasia    VAIN (vaginal intraepithelial neoplasia)     PAST SURGICAL HISTORY:   Past Surgical History:  Procedure Laterality Date   APPENDECTOMY     BICEPT TENODESIS Right 04/09/2021   Procedure: BICEPS TENODESIS;  Surgeon: Signa Kell, MD;  Location: [facility] ORS;  Service: Orthopedics;  Laterality: Right;   BREAST BIOPSY Left 05/12/2001  moderate epithelial hyperplasia without atypia and fibrocystic changes   COLONOSCOPY     HERNIA REPAIR     lower abd   IMAGE GUIDED SINUS SURGERY  2015   INSERT / REPLACE / REMOVE PACEMAKER  12/06/2003   Medtronic impulse [id number]   JOINT REPLACEMENT Right    knee   KYPHOPLASTY N/A 06/23/2017   Procedure: [id number];  Surgeon: Kennedy Bucker, MD;  Location: [facility] ORS;  Service: Orthopedics;  Laterality: N/A;   LEEP  1999   ORIF HIP FRACTURE Right 02/2016   PACEMAKER GENERATOR CHANGE N/A 09/18/2012    Procedure: PACEMAKER GENERATOR CHANGE;  Surgeon: Duke Salvia, MD;  Location: [facility] CATH LAB;  Service: Cardiovascular;  Laterality: N/A;   REVERSE SHOULDER ARTHROPLASTY Right 04/09/2021   Procedure: Right reverse shoulder arthroplasty for fracture;  Surgeon: Signa Kell, MD;  Location: [facility] ORS;  Service: Orthopedics;  Laterality: Right;   TONSILLECTOMY     TOTAL KNEE ARTHROPLASTY     TOTAL KNEE REVISION Right 09/23/2016   Procedure: TOTAL KNEE REVISION-POLYETHYLENE EXCHANGE;  Surgeon: Kennedy Bucker, MD;  Location: [facility] ORS;  Service: Orthopedics;  Laterality: Right;   VAGINAL HYSTERECTOMY      SOCIAL HISTORY:   Social History   Tobacco Use   Smoking status: Never   Smokeless tobacco: Never  Substance Use Topics   Alcohol use: Yes    Comment: 3 oz wine daily    FAMILY HISTORY:   Family History  Problem Relation Age of Onset   Stroke Mother    Heart disease Father    Diabetes Daughter    Diabetes Son     DRUG ALLERGIES:   Allergies  Allergen Reactions   Latex Other (See Comments)    blisters   Other Other (See Comments)    Blisters/ paper tape is Education administrator paper tape is Education administrator paper tape is Ok  Surveyor, mining paper tape is Ok   Sport and exercise psychologist (See Comments)    Blisters/ paper tape is Education administrator paper tape is Ok   Xarelto [Rivaroxaban] Other (See Comments)    Gums bleeding and too much bruising   Nickel Rash and Other (See Comments)    REVIEW OF SYSTEMS:   ROS As per history of present illness. All pertinent systems were reviewed above. Constitutional, HEENT, cardiovascular, respiratory, GI, GU, musculoskeletal, neuro, psychiatric, endocrine, integumentary and hematologic systems were reviewed and are otherwise negative/unremarkable except for positive findings mentioned above in the HPI.   MEDICATIONS AT HOME:   Prior to Admission medications   Medication Sig Start Date End Date Taking? Authorizing Provider  acetaminophen (TYLENOL) 500 MG tablet Take 500  mg by mouth every 8 (eight) hours as needed for mild pain or headache.    [provider]  amiodarone (PACERONE) 200 MG tablet Take 1 tablet by mouth 5 days a week 03/23/22   Duke Salvia, MD  amLODipine (NORVASC) 2.5 MG tablet TAKE 1 TABLET DAILY 06/02/22   Duke Salvia, MD  ELIQUIS 2.5 MG TABS tablet TAKE 1 TABLET TWICE A DAY 12/01/21   Duke Salvia, MD  ibandronate (BONIVA) 150 MG tablet Take 150 mg by mouth every 30 (thirty) days. Take in the morning with a full glass of water, on an empty stomach, and do not take anything else by mouth or lie down for the next 30 min.    [provider]  Multiple Vitamins-Minerals (SENIOR MULTIVITAMIN PLUS PO) Take 1 tablet by mouth daily.    [provider]  nirmatrelvir & ritonavir (PAXLOVID, 150/100,) 10 x 150 MG & 10 x 100MG  TBPK Take 1 (one) pink tablet (150 mg nirmatrelvir) with 1 (one) tablet (100 mg ritonavir) by mouth twice a day for 5 days. All 2 (two) tablets to be taken together every morning and evening. 08/24/22     propranolol ER (INDERAL LA) 80 MG 24 hr capsule Take 1 capsule (80 mg total) by mouth daily. 09/09/22   Duke Salvia, MD      VITAL SIGNS:  Blood pressure (!) 126/109, pulse 69, temperature 97.8 F (36.6 C), temperature source Oral, resp. rate 18, height 5\' 6"  (1.676 m), weight 54 kg, SpO2 99%.  PHYSICAL EXAMINATION:  Physical Exam  GENERAL:  89 y.o.-year-old Caucasian female patient lying in the bed with no acute distress.  EYES: Pupils equal, round, reactive to light and accommodation. No scleral icterus. Extraocular muscles intact.  HEENT: Head atraumatic, normocephalic. Oropharynx and nasopharynx clear.  NECK:  Supple, no jugular venous distention. No thyroid enlargement, no tenderness.  LUNGS: Normal breath sounds bilaterally, no wheezing, rales,rhonchi or crepitation. No use of accessory muscles of respiration.  CARDIOVASCULAR: Regular rate and rhythm, S1, S2 normal. No murmurs, rubs, or  gallops.  ABDOMEN: Soft, nondistended, nontender. Bowel sounds present. No organomegaly or mass.  EXTREMITIES: No pedal edema, cyanosis, or clubbing. Musculoskeletal: Right wrist tenderness.  Right lateral and medial hip tenderness. NEUROLOGIC: Cranial nerves II through XII are intact. Muscle strength 5/5 in all extremities. Sensation intact. Gait not checked.  PSYCHIATRIC: The patient is alert and oriented x 3.  Normal affect and good eye contact. SKIN: No obvious rash, lesion, or ulcer.   LABORATORY PANEL:   CBC Recent Labs  Lab 09/13/22 2006  WBC 10.0  HGB 11.9*  HCT 36.0  PLT 160   ------------------------------------------------------------------------------------------------------------------  Chemistries  Recent Labs  Lab 09/13/22 2006  NA 134*  K 4.0  CL 98  CO2 25  GLUCOSE 133*  BUN 32*  CREATININE 1.02*  CALCIUM 8.9   ------------------------------------------------------------------------------------------------------------------  Cardiac Enzymes No results for input(s): "TROPONINI" in the last 168 hours. ------------------------------------------------------------------------------------------------------------------  RADIOLOGY:  DG Hip Unilat W or Wo Pelvis 2-3 Views Right  Result Date: 09/13/2022 CLINICAL DATA:  Right hip pain, fell EXAM: DG HIP (WITH OR WITHOUT PELVIS) 2-3V RIGHT COMPARISON:  09/12/2017 FINDINGS: Frontal view of the pelvis as well as frontal and cross-table lateral views of the right hip are obtained. Right hip and knee arthroplasty is again identified, with a periprosthetic proximal right femur fracture identified along the greater trochanter. Soft tissue swelling overlies the right hip. Minimally displaced fractures are also seen through the right superior and inferior pubic rami. No other acute pelvic fractures. Stable left hip osteoarthritis. IMPRESSION: 1. Acute periprosthetic proximal right femur fracture, with fracture line extending  through the greater trochanter. 2. Acute minimally displaced right superior and inferior pubic rami fractures. 3. Left hip osteoarthritis. Electronically Signed   By: Sharlet Salina M.D.   On: 09/13/2022 21:18   DG Wrist Complete Right  Result Date: 09/13/2022 CLINICAL DATA:  Trauma to the right wrist. EXAM: RIGHT WRIST - COMPLETE 3+ VIEW COMPARISON:  None Available. FINDINGS: Displaced fractures of the distal radius and ulna with dorsal angulation and displacement of the distal fracture fragments. The distal radial fracture appears to extend into the articular surface of the wrist. There is also fracture of the ulnar styloid. No dislocation. There is soft tissue swelling of the wrist. No opaque foreign object/catheterization. IMPRESSION: Displaced fractures of the  distal radius and ulna. Electronically Signed   By: Elgie Collard M.D.   On: 09/13/2022 21:17      IMPRESSION AND PLAN:  Assessment and Plan: * Closed right hip fracture (HCC) - She will be admitted to a medical bed. - This is associated with pelvic fracture with superior and inferior right pubic ramus fracture. - Management is likely going to be conservative per Ortho. - Orthopedic consult to be obtained in AM. - Dr. Rosann Auerbach was notified about the patient. - Pain management will be provided. - We will hold off Eliquis pending orthopedic evaluation.  Right wrist fracture Orthopedic consult to be obtained as mentioned above.  Fracture was reduced in the ER.  Paroxysmal atrial fibrillation (HCC) - We will continue to run but hold off Eliquis for now.  Depression - We will continue Wellbutrin XL.  Essential hypertension - We will continue her antihypertensive therapy.  Fall at home, initial encounter - This was a mechanical fall. - She will have physical therapy and case management evaluation for the possibility of SNF placement.   DVT prophylaxis: Lovenox.  Advanced Care Planning:  Code Status: full code.  Family  Communication:  The plan of care was discussed in details with the patient (and family). I answered all questions. The patient agreed to proceed with the above mentioned plan. Further management will depend upon hospital course. Disposition Plan: Back to previous home environment Consults called: Orthopedic surgery consult All the records are reviewed and case discussed with ED provider.  Status is: Inpatient   At the time of the admission, it appears that the appropriate admission status for this patient is inpatient.  This is judged to be reasonable and necessary in order to provide the required intensity of service to ensure the patient's safety given the presenting symptoms, physical exam findings and initial radiographic and laboratory data in the context of comorbid conditions.  The patient requires inpatient status due to high intensity of service, high risk of further deterioration and high frequency of surveillance required.  I certify that at the time of admission, it is my clinical judgment that the patient will require inpatient hospital care extending more than 2 midnights.                            Dispo: The patient is from: Home              Anticipated d/c is to: Home              Patient currently is not medically stable to d/c.              Difficult to place patient: No  Hannah Beat M.D on 09/14/2022 at 12:55 AM  [facility]   From 7 PM-7 AM, contact night-coverage [email or web address]  CC: Primary care physician; Lauro Regulus, MD

## 2022-09-14 NOTE — ED Provider Notes (Incomplete)
[facility] Provider Note    Event Date/Time   First MD Initiated Contact with Patient 09/13/22 2221     (approximate)   History   Hip Pain   HPI  Alejandra Wiley is a 89 y.o. female past medical history significant for atrial fibrillation on Eliquis, who presents to the emergency department following a fall.  Patient states that she slipped on a pillow that was laying on the floor causing her to fall.  Denies any head injury.  Complaining of pain to her right hip and right wrist.  Did take her Eliquis today.  Prior hip replacement.  Denies any chest pain or shortness of breath.     Physical Exam   Triage Vital Signs: ED Triage Vitals  Encounter Vitals Group     BP 09/13/22 2202 (!) 126/109     Systolic BP Percentile --      Diastolic BP Percentile --      Pulse Rate 09/13/22 2202 69     Resp 09/13/22 2202 18     Temp 09/13/22 2202 97.8 F (36.6 C)     Temp Source 09/13/22 2202 Oral     SpO2 09/13/22 2202 99 %     Weight 09/13/22 1959 119 lb 0.8 oz (54 kg)     Height 09/13/22 1959 5\' 6"  (1.676 m)     Head Circumference --      Peak Flow --      Pain Score 09/13/22 1958 8     Pain Loc --      Pain Education --      Exclude from Growth Chart --     Most recent vital signs: Vitals:   09/13/22 2202  BP: (!) 126/109  Pulse: 69  Resp: 18  Temp: 97.8 F (36.6 C)  SpO2: 99%    Physical Exam Constitutional:      Appearance: She is well-developed.  HENT:     Head: Atraumatic.  Eyes:     Conjunctiva/sclera: Conjunctivae normal.  Cardiovascular:     Rate and Rhythm: Regular rhythm.  Pulmonary:     Effort: No respiratory distress.  Abdominal:     General: There is no distension.  Musculoskeletal:     Cervical back: Normal range of motion and neck supple. No tenderness.     Comments: Tenderness palpation to the right hip.  Obvious deformity to the right forearm with no open wounds.  +2 radial and DP pulses that are equal bilaterally.   No tenderness of the left hip or lower extremity.  No tenderness to left upper extremity.  No midline thoracic or lumbar tenderness to palpation.  Skin:    General: Skin is warm.  Neurological:     Mental Status: She is alert. Mental status is at baseline.     IMPRESSION / MDM / ASSESSMENT AND PLAN / ED COURSE  I reviewed the triage vital signs and the nursing notes.  Differential diagnosis including fracture, dislocation, musculoskeletal strain.  On chart review patient has had frequent falls and was last evaluated 3 days ago.  EKG  I, Corena Herter, the attending physician, personally viewed and interpreted this ECG.   Rate: Normal  Rhythm: Normal sinus  Axis: Normal  Intervals: Normal  ST&T Change: None  No tachycardic or bradycardic dysrhythmias while on cardiac telemetry.  RADIOLOGY I independently reviewed imaging, my interpretation of imaging: ***  LABS (all labs ordered are listed, but only abnormal results are displayed) Labs interpreted as -  Labs Reviewed  CBC - Abnormal; Notable for the following components:      Result Value   RBC 3.78 (*)    Hemoglobin 11.9 (*)    All other components within normal limits  BASIC METABOLIC PANEL - Abnormal; Notable for the following components:   Sodium 134 (*)    Glucose, Bld 133 (*)    BUN 32 (*)    Creatinine, Ser 1.02 (*)    GFR, Estimated 53 (*)    All other components within normal limits  PROTIME-INR - Abnormal; Notable for the following components:   Prothrombin Time 15.8 (*)    All other components within normal limits  URINALYSIS, ROUTINE W REFLEX MICROSCOPIC - Abnormal; Notable for the following components:   Color, Urine YELLOW (*)    APPearance CLEAR (*)    Ketones, ur 5 (*)    Protein, ur 30 (*)    Bacteria, UA MANY (*)    All other components within normal limits  TROPONIN I (HIGH SENSITIVITY)  TROPONIN I (HIGH SENSITIVITY)     MDM       PROCEDURES:  Critical Care performed:  No  Procedures  Patient's presentation is most consistent with {EM COPA:27473}   MEDICATIONS ORDERED IN ED: Medications  fentaNYL (SUBLIMAZE) injection 50 mcg (50 mcg Intravenous Given 09/13/22 2255)    FINAL CLINICAL IMPRESSION(S) / ED DIAGNOSES   Final diagnoses:  None     Rx / DC Orders   ED Discharge Orders     None        Note:  This document was prepared using Dragon voice recognition software and may include unintentional dictation errors.

## 2022-09-14 NOTE — Assessment & Plan Note (Addendum)
-   She will be admitted to a medical bed. - This is associated with pelvic fracture with superior and inferior right pubic ramus fracture. - Management is likely going to be conservative per Ortho. - Orthopedic consult to be obtained in AM. - Dr. Rosann Auerbach was notified about the patient. - Pain management will be provided. - We will hold off Eliquis pending orthopedic evaluation.

## 2022-09-14 NOTE — Assessment & Plan Note (Signed)
Orthopedic consult to be obtained as mentioned above.  Fracture was reduced in the ER.

## 2022-09-14 NOTE — Progress Notes (Signed)
Brief hospitalist update note.  This is a nonbillable note.  Please see same-day H&P for full billable details.  Briefly, this is an 89 year old Caucasian female with history significant for CVA, paroxysmal atrial fibrillation on Eliquis, CHF who presents to the ED with chief complaint of a accidental mechanical fall.  Patient states that she slipped on a decorative pillow and fell onto hardwood flooring on the right side and had subsequent right hip and right wrist pain  Patient has a  right periprosthetic greater tuberosity fracture and right superior and inferior pubic rami fractures as well as right distal radial and ulnar fracture closed reduction in ED and splinting  Last dose of Eliquis was 8/20 6 AM.  Seen by orthopedics in the ED.  On my evaluation patient does report significant pain currently responding to multimodal pain regimen.  Plan: Continue holding Eliquis Okay for diet. Pending orthopedic follow-up  Lolita Patella MD  No charge

## 2022-09-14 NOTE — Consult Note (Signed)
ORTHOPEDIC CONSULTATION  Alejandra Wiley [id number]  09/14/2022  CC:  Chief Complaint  Patient presents with   Hip Pain    History of Present IlIness: The patient is a 89 y.o. female who presented to the local emergency department complaining of right wrist pain and right hip pain after ground-level fall when she tripped over a pillow.  She denies loss of consciousness.  She denies neck or back pain.  Of significance the patient has a known history of atrial fibrillation and is on Eliquis.  Her last dose was 09-13-2022.  The patient was extensively evaluated in the emergency department and noted to have right pubic rami fractures and a right periprosthetic hip fracture involving the greater trochanter as well as a comminuted displaced impacted fracture of her right distal radius and ulna.  She underwent closed reduction by the emergency room provider and splinting.  Orthopedics was consulted.  She previously had a right bipolar hemiarthroplasty performed in [place] while she was visiting her daughter.  She has had a right shoulder surgery with Dr. Allena Katz with the [place] [facility].  Admission history for completeness: Alejandra Wiley is a 89 y.o. female past medical history significant for atrial fibrillation on Eliquis, who presents to the emergency department following a fall.  Patient states that she slipped on a pillow that was laying on the floor causing her to fall.  Denies any head injury.  Complaining of pain to her right hip and right wrist.  Did take her Eliquis today.  Prior hip replacement.  Denies any chest pain or shortness of breath.   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 Rosita Kea during a back surgery in approx 2019.   Hypertension    Long term current use of anticoagulant    a.) dose reduced apixaban   MRSA nasal colonization 09/17/2016   Pacemaker 12/06/2003   Medtronic Impulse DTDR01   Pacemaker 2015   PAF (paroxysmal atrial fibrillation) (HCC)    a.) CHA2DS2-VASc = 8 (age x 2, sex, CHF, HTN, CVA x 2, aortic plaque). b.) rate/rhythm maintained on oral amiodarone + metoprolol succinate; chronically anticoagulated with dose reduced apixaban   Sick sinus syndrome (HCC)    Sinoatrial node dysfunction (HCC)    Stroke (HCC)    Vaginal intraepithelial neoplasia    VAIN (vaginal intraepithelial neoplasia)     SH:  Past Surgical History:  Procedure Laterality Date   APPENDECTOMY     BICEPT TENODESIS Right 04/09/2021   Procedure: BICEPS TENODESIS;  Surgeon: Signa Kell, MD;  Location: [facility] ORS;  Service: Orthopedics;  Laterality: Right;   BREAST BIOPSY Left 05/12/2001   moderate epithelial hyperplasia without atypia and fibrocystic changes   COLONOSCOPY     HERNIA REPAIR     lower abd   IMAGE GUIDED SINUS SURGERY  2015   INSERT / REPLACE / REMOVE PACEMAKER  12/06/2003   Medtronic impulse [id number]   JOINT REPLACEMENT Right    knee   KYPHOPLASTY N/A 06/23/2017   Procedure: [id number];  Surgeon: Kennedy Bucker, MD;  Location: [facility] ORS;  Service: Orthopedics;  Laterality: N/A;   LEEP  1999  ORIF HIP FRACTURE Right 02/2016   PACEMAKER GENERATOR CHANGE N/A 09/18/2012   Procedure: PACEMAKER GENERATOR CHANGE;  Surgeon: Duke Salvia, MD;  Location: [facility] CATH LAB;  Service: Cardiovascular;  Laterality: N/A;   REVERSE SHOULDER ARTHROPLASTY Right 04/09/2021   Procedure: Right reverse shoulder arthroplasty for fracture;  Surgeon: Signa Kell, MD;  Location: [facility] ORS;  Service: Orthopedics;  Laterality: Right;   TONSILLECTOMY     TOTAL KNEE ARTHROPLASTY     TOTAL KNEE REVISION Right 09/23/2016   Procedure: TOTAL KNEE REVISION-POLYETHYLENE EXCHANGE;   Surgeon: Kennedy Bucker, MD;  Location: [facility] ORS;  Service: Orthopedics;  Laterality: Right;   VAGINAL HYSTERECTOMY      ALL:  Allergies  Allergen Reactions   Latex Other (See Comments)    blisters   Other Other (See Comments)    Blisters/ paper tape is Education administrator paper tape is Education administrator paper tape is Musician paper tape is Ok   Sport and exercise psychologist (See Comments)    Blisters/ paper tape is Education administrator paper tape is Ok   Xarelto [Rivaroxaban] Other (See Comments)    Gums bleeding and too much bruising   Nickel Rash and Other (See Comments)    MED:  Medications Prior to Admission  Medication Sig Dispense Refill Last Dose   acetaminophen (TYLENOL) 500 MG tablet Take 500 mg by mouth every 8 (eight) hours as needed for mild pain or headache.      amiodarone (PACERONE) 200 MG tablet Take 1 tablet by mouth 5 days a week 90 tablet 3    amLODipine (NORVASC) 2.5 MG tablet TAKE 1 TABLET DAILY 90 tablet 1    ELIQUIS 2.5 MG TABS tablet TAKE 1 TABLET TWICE A DAY 180 tablet 3    ibandronate (BONIVA) 150 MG tablet Take 150 mg by mouth every 30 (thirty) days. Take in the morning with a full glass of water, on an empty stomach, and do not take anything else by mouth or lie down for the next 30 min.      Multiple Vitamins-Minerals (SENIOR MULTIVITAMIN PLUS PO) Take 1 tablet by mouth daily.      nirmatrelvir & ritonavir (PAXLOVID, 150/100,) 10 x 150 MG & 10 x 100MG  TBPK Take 1 (one) pink tablet (150 mg nirmatrelvir) with 1 (one) tablet (100 mg ritonavir) by mouth twice a day for 5 days. All 2 (two) tablets to be taken together every morning and evening. 20 tablet 0    propranolol ER (INDERAL LA) 80 MG 24 hr capsule Take 1 capsule (80 mg total) by mouth daily. 90 capsule 3     All home medications have been reviewed as documented in the medication reconciliation portion of the patient record.  FH:  Family History  Problem Relation Age of Onset   Stroke Mother    Heart disease Father     Diabetes Daughter    Diabetes Son     Social:  reports that she has never smoked. She has never used smokeless tobacco. She reports current alcohol use. She reports that she does not use drugs.  Review of Systems: General: Denies fever, chills, weight loss Eyes: Denies blurry vision, changes in vision ENT: Denies sore throat, congestions, nosebleeds CV: Denies chest pain, palpitations Respiratory: Denies shortness of breath, wheezing, cough Gl: Denies abdominal pain, nausea, vomiting GU: Denies hematuria Integumentary: Denies rashes or lesions Neuro: Denies headache, dizziness Psych: Negative Hem/Onc: Denies easy bruising or bleeding disorders Musculoskeletal: See HPI above.  Vitals: BP 112/61  Pulse 69   Temp 98 F (36.7 C) (Oral)   Resp 18   Ht 5\' 6"  (1.676 m)   Wt 54 kg   SpO2 100%   BMI 19.21 kg/m    Physical Exam: General: Awake, alert and oriented, no acute distress. Eyes: Pupils reactive, EOMI, normal conjunctiva, no scleral icterus. HENT: Normocephalic, atraumatic, normal hearing, moist oral mucosa Neck: Supple, non-tender, no cervical lymphadenopathy. Lungs: Chest rise is symmetric, non-labored respiration, chest wall nontender to palpation Heart: Normal rate by palpation, normal peripheral perfusion Abdomen: Soft, non-tender, non-distended. Pelvis is stable. Skin: Skin envelope intact, dry and pink, no rashes or lesions, no signs of infection. Neurologic: Awake, alert, and oriented X3 Psychiatric: Cooperative, appropriate mood and affect.  Musculoskeletal: Evaluation of the patient's bilateral upper extremities and left lower extremity reveal no areas of tenderness or limitations to range of motion. Any motion about the patient's right hip is extremely painful. Palpation of the right distal femur, knee, tib-fib region, ankle and foot show no areas of tenderness. The patient's calves are soft and nontender with no palpable cords. Homans testing is negative.  Dorsalis pedis and posterior tibial pulse are 2+ and symmetric. The patient's toes are pink and warm with a brisk capillary refill time. The patient is able to gently move their ankles and toes on command. Sensation is intact over all lower extremity dermatomal patterns.   The patient has a well-padded splint applied to her right forearm to stabilize her distal radius and ulna fractures.  Her fingers are pink and warm with a brisk capillary refill time.  She can gingerly move her fingers and thumb well on command and the compartments of her forearm proximally are soft and nontender.  She is neurologically intact to the radial, median, and ulnar nerves distally.  Radiographic findings: Radiographs of the patient's pelvis and right hip as well as right wrist pre and postreduction were evaluated on the PACS system.  The patient has a right periprosthetic fracture involving the greater trochanter which is nondisplaced around her bipolar hemiarthroplasty that was performed in 2018.  The hemiarthroplasty is well reduced and the distal stem appears well-seated.  The patient has a impacted displaced distal radius metaphyseal fracture that I believe has a nondisplaced intra-articular split.  The patient has a comminuted intra-articular distal ulna fracture.  The postreduction films reveal improvement in alignment.  Overall bone quality is reduced in keeping with her age.   Labs:  Recent Labs    09/13/22 2006 09/14/22 0349  HGB 11.9* 9.9*   Recent Labs    09/13/22 2006 09/14/22 0349  WBC 10.0 12.5*  RBC 3.78* 3.15*  HCT 36.0 29.9*  PLT 160 137*   Recent Labs    09/13/22 2006 09/14/22 0349  NA 134* 136  K 4.0 4.8  CL 98 102  CO2 25 25  BUN 32* 35*  CREATININE 1.02* 0.94  GLUCOSE 133* 154*  CALCIUM 8.9 8.4*   Recent Labs    09/13/22 2006  INR 1.2     Assessment/Plan:    Assessment: 89 year old female status post ground-level fall with closed right superior and inferior pubic  rami fractures, right periprosthetic greater tuberosity fracture around bipolar hemiarthroplasty.  One of the pubic rami fractures may be chronic from her previous fall.  Closed comminuted intra-articular right distal radius and ulna fractures postreduction in the ED and splinted.  Patient is on Eliquis for chronic atrial fibrillation and had her last dose 09-13-2022 which has been held pending orthopedic evaluation.  She is currently NPO.   Plan:  I discussed with the patient that I believe these fractures at her age and activity level may be successfully treated closed.  We discussed that certainly the alignment could be improved with open reduction/internal fixation of her right distal radius and ulna fractures but we have to weigh the risk of surgery versus anesthesia versus the benefits of improved alignment and function.  We discussed that she has previously been a patient of Dr. Eliane Decree in our group.  I will discuss her case with my colleagues in the [place] [facility] and her care will be assumed by one of them as I am leaving town this morning.  If they agree with nonoperative treatment at this time, especially being on Eliquis we will start a heart healthy diet.  Cecil Cranker M.D. 09/14/2022 6:45 AM

## 2022-09-14 NOTE — ED Provider Notes (Signed)
I assisted Dr. Arnoldo Morale with hematoma block, hanging in finger traps, reduction and splinting.  Follow-up x-rays with improved alignment.  Her pain is improved after splinting.  Clinical Course as of 09/14/22 0115  Tue Sep 14, 2022  0005 Hematoma block and hanging [DS]  0031 Reduced, splinted and molded. Waiting on repeat xr before she goes upstairs to a ready room [DS]    Clinical Course User Index [DS] Delton Prairie, MD    .Ortho Injury Treatment  Date/Time: 09/14/2022 1:16 AM  Performed by: Delton Prairie, MD Authorized by: Delton Prairie, MD   Consent:    Consent given by:  Patient   Risks discussed:  FractureInjury location: wrist Location details: right wrist Injury type: fracture Fracture type: distal radius and ulnar styloid Pre-procedure neurovascular assessment: neurovascularly intact Anesthesia: hematoma block  Anesthesia: Local anesthesia used: yes Local Anesthetic: lidocaine 1% without epinephrine Anesthetic total: 10 mL  Patient sedated: NoManipulation performed: yes Skeletal traction used: yes Reduction successful: yes (Improved alignment, but not quite back to anatomic position) X-ray confirmed reduction: yes Immobilization: splint Splint type: sugar tong Splint Applied by: ED Provider Supplies used: cotton padding, elastic bandage and Ortho-Glass Post-procedure neurovascular assessment: post-procedure neurovascularly intact        Delton Prairie, MD 09/14/22 [phone]

## 2022-09-15 ENCOUNTER — Inpatient Hospital Stay: Payer: Medicare Other | Admitting: Anesthesiology

## 2022-09-15 ENCOUNTER — Encounter
Admission: EM | Disposition: A | Discharge status: Skilled Nursing Facility | Payer: Self-pay | Source: Home / Self Care | Attending: Internal Medicine

## 2022-09-15 ENCOUNTER — Encounter: Payer: Self-pay | Admitting: Family Medicine

## 2022-09-15 DIAGNOSIS — S72001A Fracture of unspecified part of neck of right femur, initial encounter for closed fracture: Secondary | ICD-10-CM | POA: Diagnosis not present

## 2022-09-15 DIAGNOSIS — E440 Moderate protein-calorie malnutrition: Secondary | ICD-10-CM | POA: Insufficient documentation

## 2022-09-15 HISTORY — PX: OPEN REDUCTION INTERNAL FIXATION (ORIF) DISTAL RADIAL FRACTURE: SHX5989

## 2022-09-15 SURGERY — OPEN REDUCTION INTERNAL FIXATION (ORIF) DISTAL RADIUS FRACTURE
Anesthesia: General | Laterality: Right

## 2022-09-15 MED ORDER — ONDANSETRON HCL 4 MG PO TABS: 4.0000 mg | ORAL_TABLET | Freq: Four times a day (QID) | ORAL | Status: DC | PRN

## 2022-09-15 MED ORDER — PROPOFOL 10 MG/ML IV BOLUS
INTRAVENOUS | Status: AC
  Filled 2022-09-15: qty 20

## 2022-09-15 MED ORDER — FENTANYL CITRATE (PF) 100 MCG/2ML IJ SOLN
INTRAMUSCULAR | Status: AC
  Filled 2022-09-15: qty 2

## 2022-09-15 MED ORDER — MAGNESIUM HYDROXIDE 400 MG/5ML PO SUSP
30.0000 mL | Freq: Every day | ORAL | Status: DC | PRN
  Administered 2022-09-17 (×2): 30 mL via ORAL

## 2022-09-15 MED ORDER — FENTANYL CITRATE (PF) 100 MCG/2ML IJ SOLN: 25.0000 ug | INTRAMUSCULAR | Status: DC | PRN

## 2022-09-15 MED ORDER — ACETAMINOPHEN 500 MG PO TABS
500.0000 mg | ORAL_TABLET | Freq: Four times a day (QID) | ORAL | Status: AC
  Administered 2022-09-15 – 2022-09-16 (×4): 500 mg via ORAL
  Filled 2022-09-15 (×4): qty 1

## 2022-09-15 MED ORDER — 0.9 % SODIUM CHLORIDE (POUR BTL) OPTIME
TOPICAL | Status: DC | PRN
  Administered 2022-09-15: 100 mL

## 2022-09-15 MED ORDER — CEFAZOLIN SODIUM-DEXTROSE 2-4 GM/100ML-% IV SOLN
INTRAVENOUS | Status: AC
  Filled 2022-09-15: qty 100

## 2022-09-15 MED ORDER — DEXAMETHASONE SODIUM PHOSPHATE 10 MG/ML IJ SOLN
INTRAMUSCULAR | Status: DC | PRN
  Administered 2022-09-15: 10 mg via INTRAVENOUS

## 2022-09-15 MED ORDER — METOCLOPRAMIDE HCL 5 MG PO TABS: 5.0000 mg | ORAL_TABLET | Freq: Three times a day (TID) | ORAL | Status: DC | PRN

## 2022-09-15 MED ORDER — PROPOFOL 10 MG/ML IV BOLUS
INTRAVENOUS | Status: DC | PRN
  Administered 2022-09-15: 100 mg via INTRAVENOUS

## 2022-09-15 MED ORDER — DIPHENHYDRAMINE HCL 12.5 MG/5ML PO ELIX: 12.5000 mg | ORAL_SOLUTION | ORAL | Status: DC | PRN

## 2022-09-15 MED ORDER — APIXABAN 2.5 MG PO TABS
2.5000 mg | ORAL_TABLET | Freq: Two times a day (BID) | ORAL | Status: DC
  Administered 2022-09-16 – 2022-09-18 (×5): 2.5 mg via ORAL
  Filled 2022-09-15 (×5): qty 1

## 2022-09-15 MED ORDER — BUPIVACAINE-EPINEPHRINE (PF) 0.5% -1:200000 IJ SOLN
INTRAMUSCULAR | Status: AC
  Filled 2022-09-15: qty 20

## 2022-09-15 MED ORDER — BISACODYL 10 MG RE SUPP
10.0000 mg | Freq: Every day | RECTAL | Status: DC | PRN
  Administered 2022-09-18: 10 mg via RECTAL
  Filled 2022-09-15 (×2): qty 1

## 2022-09-15 MED ORDER — CEFAZOLIN SODIUM-DEXTROSE 2-4 GM/100ML-% IV SOLN
2.0000 g | INTRAVENOUS | Status: AC
  Administered 2022-09-15: 2 g via INTRAVENOUS

## 2022-09-15 MED ORDER — LACTATED RINGERS IV SOLN: INTRAVENOUS | Status: DC | PRN

## 2022-09-15 MED ORDER — ACETAMINOPHEN 325 MG PO TABS: 325.0000 mg | ORAL_TABLET | Freq: Four times a day (QID) | ORAL | Status: DC | PRN

## 2022-09-15 MED ORDER — SODIUM CHLORIDE 0.9 % IV SOLN
INTRAVENOUS | Status: DC
  Administered 2022-09-16: 75 mL/h via INTRAVENOUS

## 2022-09-15 MED ORDER — ONDANSETRON HCL 4 MG/2ML IJ SOLN
INTRAMUSCULAR | Status: DC | PRN
  Administered 2022-09-15: 4 mg via INTRAVENOUS

## 2022-09-15 MED ORDER — PHENYLEPHRINE HCL (PRESSORS) 10 MG/ML IV SOLN
INTRAVENOUS | Status: DC | PRN
  Administered 2022-09-15 (×2): 80 ug via INTRAVENOUS

## 2022-09-15 MED ORDER — FENTANYL CITRATE (PF) 100 MCG/2ML IJ SOLN
INTRAMUSCULAR | Status: DC | PRN
  Administered 2022-09-15 (×4): 25 ug via INTRAVENOUS

## 2022-09-15 MED ORDER — FLEET ENEMA RE ENEM: 1.0000 | ENEMA | Freq: Once | RECTAL | Status: DC | PRN

## 2022-09-15 MED ORDER — LIDOCAINE HCL (PF) 2 % IJ SOLN
INTRAMUSCULAR | Status: AC
  Filled 2022-09-15: qty 5

## 2022-09-15 MED ORDER — BUPIVACAINE HCL (PF) 0.5 % IJ SOLN
INTRAMUSCULAR | Status: DC | PRN
  Administered 2022-09-15: 10 mL

## 2022-09-15 MED ORDER — LIDOCAINE HCL (CARDIAC) PF 100 MG/5ML IV SOSY
PREFILLED_SYRINGE | INTRAVENOUS | Status: DC | PRN
  Administered 2022-09-15: 60 mg via INTRAVENOUS

## 2022-09-15 MED ORDER — ONDANSETRON HCL 4 MG/2ML IJ SOLN
4.0000 mg | Freq: Four times a day (QID) | INTRAMUSCULAR | Status: DC | PRN
  Administered 2022-09-18: 4 mg via INTRAVENOUS
  Filled 2022-09-15: qty 2

## 2022-09-15 MED ORDER — METOCLOPRAMIDE HCL 5 MG/ML IJ SOLN: 5.0000 mg | Freq: Three times a day (TID) | INTRAMUSCULAR | Status: DC | PRN

## 2022-09-15 MED ORDER — DOCUSATE SODIUM 100 MG PO CAPS
100.0000 mg | ORAL_CAPSULE | Freq: Two times a day (BID) | ORAL | Status: DC
  Administered 2022-09-15 – 2022-09-18 (×6): 100 mg via ORAL
  Filled 2022-09-15 (×6): qty 1

## 2022-09-15 SURGICAL SUPPLY — 69 items
APL PRP STRL LF DISP 70% ISPRP (MISCELLANEOUS) ×1
APL SKNCLS STERI-STRIP NONHPOA (GAUZE/BANDAGES/DRESSINGS) ×1
BENZOIN TINCTURE PRP APPL 2/3 (GAUZE/BANDAGES/DRESSINGS) IMPLANT
BIT DRILL 2.2 SS TIBIAL (BIT) IMPLANT
BNDG CMPR 5X3 KNIT ELC UNQ LF (GAUZE/BANDAGES/DRESSINGS) ×1
BNDG CMPR 5X4 CHSV STRCH STRL (GAUZE/BANDAGES/DRESSINGS) ×1
BNDG CMPR 5X4 KNIT ELC UNQ LF (GAUZE/BANDAGES/DRESSINGS) ×1
BNDG CMPR STD VLCR NS LF 5.8X3 (GAUZE/BANDAGES/DRESSINGS) ×1
BNDG COHESIVE 4X5 TAN STRL LF (GAUZE/BANDAGES/DRESSINGS) ×1 IMPLANT
BNDG ELASTIC 3INX 5YD STR LF (GAUZE/BANDAGES/DRESSINGS) IMPLANT
BNDG ELASTIC 3X5.8 VLCR NS LF (GAUZE/BANDAGES/DRESSINGS) IMPLANT
BNDG ELASTIC 4INX 5YD STR LF (GAUZE/BANDAGES/DRESSINGS) ×1 IMPLANT
BNDG ESMARCH 4 X 12 STRL LF (GAUZE/BANDAGES/DRESSINGS) ×1
BNDG ESMARCH 4X12 STRL LF (GAUZE/BANDAGES/DRESSINGS) ×1 IMPLANT
CHLORAPREP W/TINT 26 (MISCELLANEOUS) ×2 IMPLANT
CORD BIP STRL DISP 12FT (MISCELLANEOUS) ×1 IMPLANT
CUFF TOURN SGL QUICK 18X4 (TOURNIQUET CUFF) IMPLANT
DRAPE FLUOR MINI C-ARM 54X84 (DRAPES) ×1 IMPLANT
DRAPE ORTHO SPLIT 77X108 STRL (DRAPES)
DRAPE SURG 17X11 SM STRL (DRAPES) ×1 IMPLANT
DRAPE SURG ORHT 6 SPLT 77X108 (DRAPES) ×1 IMPLANT
DRAPE U-SHAPE 47X51 STRL (DRAPES) ×1 IMPLANT
DRSG XEROFORM 1X8 (GAUZE/BANDAGES/DRESSINGS) IMPLANT
ELECT CAUTERY BLADE 6.4 (BLADE) ×1 IMPLANT
ELECT REM PT RETURN 9FT ADLT (ELECTROSURGICAL) ×1
ELECTRODE REM PT RTRN 9FT ADLT (ELECTROSURGICAL) ×1 IMPLANT
FORCEPS JEWEL BIP 4-3/4 STR (INSTRUMENTS) ×1 IMPLANT
GAUZE SPONGE 4X4 12PLY STRL (GAUZE/BANDAGES/DRESSINGS) ×1 IMPLANT
GAUZE XEROFORM 1X8 LF (GAUZE/BANDAGES/DRESSINGS) ×1 IMPLANT
GLOVE BIO SURGEON STRL SZ8 (GLOVE) ×1 IMPLANT
GLOVE INDICATOR 8.0 STRL GRN (GLOVE) ×1 IMPLANT
GOWN STRL REUS W/ TWL LRG LVL3 (GOWN DISPOSABLE) ×1 IMPLANT
GOWN STRL REUS W/ TWL XL LVL3 (GOWN DISPOSABLE) ×1 IMPLANT
GOWN STRL REUS W/TWL LRG LVL3 (GOWN DISPOSABLE) ×1
GOWN STRL REUS W/TWL XL LVL3 (GOWN DISPOSABLE) ×1
K-WIRE 1.6 (WIRE) ×1
K-WIRE FX5X1.6XNS BN SS (WIRE) ×1
KIT TURNOVER KIT A (KITS) ×1 IMPLANT
KWIRE FX5X1.6XNS BN SS (WIRE) IMPLANT
MANIFOLD NEPTUNE II (INSTRUMENTS) ×1 IMPLANT
NDL FILTER BLUNT 18X1 1/2 (NEEDLE) ×1 IMPLANT
NEEDLE FILTER BLUNT 18X1 1/2 (NEEDLE) ×1 IMPLANT
NS IRRIG 500ML POUR BTL (IV SOLUTION) ×1 IMPLANT
PACK EXTREMITY ARMC (MISCELLANEOUS) ×1 IMPLANT
PADDING CAST BLEND 4X4 STRL (MISCELLANEOUS) ×2 IMPLANT
PADDING CAST COTTON 2X4 NS (CAST SUPPLIES) IMPLANT
PEG LOCKING SMOOTH 2.2X16 (Screw) IMPLANT
PEG LOCKING SMOOTH 2.2X18 (Peg) IMPLANT
PEG LOCKING SMOOTH 2.2X20 (Screw) IMPLANT
PLATE NARROW DVR RIGHT (Plate) IMPLANT
SCREW BN 14X2.7XNONLOCK 3 LD (Screw) IMPLANT
SCREW LOCK 14X2.7X 3 LD TPR (Screw) IMPLANT
SCREW LOCKING 2.7X14 (Screw) ×1 IMPLANT
SCREW LP NL 2.7X22MM (Screw) IMPLANT
SCREW NLOCK 2.7X14 (Screw) ×2 IMPLANT
SPLINT CAST 1 STEP 3X12 (MISCELLANEOUS) IMPLANT
SPLINT CAST 1 STEP 4X15 (MISCELLANEOUS) IMPLANT
STAPLER SKIN PROX 35W (STAPLE) ×1 IMPLANT
STOCKINETTE IMPERVIOUS 9X36 MD (GAUZE/BANDAGES/DRESSINGS) ×1 IMPLANT
STRIP CLOSURE SKIN 1/4X4 (GAUZE/BANDAGES/DRESSINGS) IMPLANT
SUT PROLENE 4 0 PS 2 18 (SUTURE) ×1 IMPLANT
SUT VIC AB 2-0 SH 27 (SUTURE) ×1
SUT VIC AB 2-0 SH 27XBRD (SUTURE) ×1 IMPLANT
SUT VIC AB 3-0 SH 27 (SUTURE) ×1
SUT VIC AB 3-0 SH 27X BRD (SUTURE) ×1 IMPLANT
SWABSTK COMLB BENZOIN TINCTURE (MISCELLANEOUS) IMPLANT
SYR 10ML LL (SYRINGE) ×1 IMPLANT
TRAP FLUID SMOKE EVACUATOR (MISCELLANEOUS) ×1 IMPLANT
WATER STERILE IRR 500ML POUR (IV SOLUTION) ×1 IMPLANT

## 2022-09-15 NOTE — Op Note (Signed)
09/15/2022  5:27 PM  Patient:   Alejandra Wiley  Pre-Op Diagnosis:   Closed acute extra-articular distal radius fracture with ulnar styloid fracture, right wrist.  Post-Op Diagnosis:   Same.  Procedure:   Open reduction and internal fixation of right distal radius fracture.  Surgeon:   Maryagnes Amos, MD  Assistant:   None  Anesthesia:   General LMA  Findings:   As above.  Complications:   None  EBL:   5 cc  Fluids:   200 cc crystalloid  TT:   85 minutes at 250 mmHg  Drains:   None  Closure:   3-0 Vicryl subcuticular sutures  Implants:   Biomet DVR Cross-locked narrow precontoured distal radius mini-locking plate.  Brief Clinical Note:   The patient is a 89 year old female who sustained above-noted injury following a fall several days ago when she slipped on a pillow on her bathroom floor and landed on her right side. She presented to the emergency room where x-rays demonstrated the above-noted fracture. The patient also sustained a nondisplaced greater trochanteric fracture of her right hip and so was admitted for pain control and placement. She presents at this time for definitive management of her right wrist injury.  Procedure:   The patient was brought into the operating room and lain in the supine position. After adequate general laryngal mask anesthesia was obtained, the patient's right hand and upper extremity were prepped with ChloraPrep solution before being draped sterilely. Preoperative antibiotics were administered. A timeout was performed to verify the appropriate surgical site before the limb was exsanguinated with an Esmarch and the tourniquet inflated to 250 mmHg.   An approximately 7-8 cm incision was made over the volar aspect of the distal radius beginning at the volar flexion crease and extending proximally along the flexor carpi radialis tendon. The incision was carried down through the subcutaneous tissues to expose the superficial retinaculum. This was  split the length of the incision directly over the flexor carpi radialis tendon. The FCR sheath was opened and the tendon retracted ulnarly to protect the median nerve. The floor of the FCR sheath was opened to expose the pronator quadratus muscle. This was released along the radial insertion and the muscle was retracted ulnarly to expose the distal radius. The fracture was identified and soft tissues elevated off the distal metaphyseal region for several centimeters.   The appropriate sized plate was selected and positioned on the distal radius. A guidewire was placed through the distal hole and its position verified using FluoroScan imaging in AP and lateral projections. After several attempts, the pin was positioned parallel to the distal articular surface and approximately 3-4 mm proximal to the articular surface. Distally, a nonlocking cortical screw was placed in the ulnar most hole of the more proximal row. The other holes were filled with locking pegs of the appropriate lengths. The adequacy of hardware position and fracture reduction was verified using FluoroScan imaging in AP, lateral, and several additional oblique projections to be sure that the hardware did not enter the joint nor did it penetrate dorsally.   The plate was carefully lowered down onto the distal metaphysis, reducing the fracture in the process. The plate was secured proximally using two bicortical nonlocking screws and one locking cortical screw to secure the plate to the metaphyseal region. Again the construct was assessed using FluoroScan imaging in AP, lateral, and oblique projections and found to be excellent.  The wound was copiously irrigated with sterile saline solution before  the pronator quadratus was reapproximated using 2-0 Vicryl interrupted sutures. The subcutaneous tissues were closed using 2-0 Vicryl interrupted sutures before the subcuticular layer was closed using 3-0 Vicryl inverted interrupted sutures. Benzoin  and Steri-Strips are applied to the skin. A total of 10 cc of 0.5% plain Sensorcaine was injected in and around the incision site to help with postoperative analgesia before a sterile bulky dressing was applied to the wound. In addition, a small skin tear developed during the reduction process over the dorsal aspect of her wrist. This was reapproximated using benzoin and Steri-Strips and incorporated into the sterile bulky dressing. The patient was placed into a volar splint maintaining the wrist in neutral position before the patient was awakened, extubated, and returned to the recovery room in satisfactory condition after tolerating the procedure well.

## 2022-09-15 NOTE — Plan of Care (Signed)
  Problem: Elimination: Goal: Will not experience complications related to bowel motility Outcome: Progressing Goal: Will not experience complications related to urinary retention Outcome: Progressing   Problem: Pain Managment: Goal: General experience of comfort will improve Outcome: Progressing   Problem: Skin Integrity: Goal: Risk for impaired skin integrity will decrease Outcome: Progressing   

## 2022-09-15 NOTE — Anesthesia Procedure Notes (Signed)
Procedure Name: LMA Insertion Date/Time: 09/15/2022 3:32 PM  Performed by: Omer Jack, CRNAPre-anesthesia Checklist: Patient identified, Patient being monitored, Timeout performed, Emergency Drugs available and Suction available Patient Re-evaluated:Patient Re-evaluated prior to induction Oxygen Delivery Method: Circle system utilized Preoxygenation: Pre-oxygenation with 100% oxygen Induction Type: IV induction LMA: LMA inserted LMA Size: 4.0 Number of attempts: 1 Placement Confirmation: positive ETCO2 and breath sounds checked- equal and bilateral Tube secured with: Tape Dental Injury: Teeth and Oropharynx as per pre-operative assessment  Comments: Inserted by Hassel Neth

## 2022-09-15 NOTE — Progress Notes (Signed)
Progress Note    Alejandra Wiley  YQM:[id number] DOB: 05-Feb-1932  DOA: 09/13/2022 PCP: Alejandra Wiley      Brief Narrative:    Medical records reviewed and are as summarized below:  Alejandra Wiley is a 89 y.o. female with medical history significant for anxiety, hypertension, CHF, aortic atherosclerosis, CVA, paroxysmal atrial fibrillation on Eliquis anxiety, who presented to the hospital because of right hip pain after a mechanical fall  She was found to have close right hip fracture, right wrist fracture involving the right radius on the right.    Assessment/Plan:   Principal Problem:   Closed right hip fracture [facility]) Active Problems:   Right wrist fracture   Paroxysmal atrial fibrillation (HCC)   Fall at home, initial encounter   Essential hypertension   Depression   Closed fracture of right radius and ulna   Malnutrition of moderate degree   Nutrition Problem: Moderate Malnutrition Etiology: social / environmental circumstances  Signs/Symptoms: moderate fat depletion, moderate muscle depletion   Body mass index is 20.6 kg/m.    S/p mechanical fall at home, right distal radius fracture, right distal ulna fracture: Plan for ORIF of right distal radius fracture today.  Analgesics as needed for pain. Closed right hip fracture: Conservative management recommended.  Analgesics as needed for pain.  PT and OT evaluation   Paroxysmal atrial fibrillation: Eliquis has been held for upcoming surgery.    In the comorbidities include depression, hypertension    Diet Order             Diet NPO time specified  Diet effective now                            Consultants: Orthopedic surgeon  Procedures: Plan for ORIF right radius fracture    Medications:    amiodarone  200 mg Oral Daily   amLODipine  2.5 mg Oral Daily   feeding supplement  237 mL Oral BID BM   multivitamin with minerals  1 tablet Oral Daily   propranolol  ER  80 mg Oral Daily   Continuous Infusions:  sodium chloride 100 mL/hr at 09/14/22 1201    ceFAZolin (ANCEF) IV       Anti-infectives (From admission, onward)    Start     Dose/Rate Route Frequency Ordered Stop   09/15/22 0719  ceFAZolin (ANCEF) IVPB 2g/100 mL premix        2 g 200 mL/hr over 30 Minutes Intravenous 30 min pre-op 09/15/22 0719                Family Communication/Anticipated D/C date and plan/Code Status   DVT prophylaxis:      Code Status: DNR  Family Communication: None Disposition Plan: Plan to discharge to SNF   Status is: Inpatient Remains inpatient appropriate because: Right hip and wrist fracture       Subjective:   Interval events noted.  She complains of pain in the right hip and the right wrist/hand.  Objective:    Vitals:   09/14/22 1516 09/14/22 1708 09/14/22 2237 09/15/22 0738  BP:  (!) 123/58 118/66 139/62  Pulse:  70 70 68  Resp:  17 16 18   Temp:  98.7 F (37.1 C) 98.7 F (37.1 C) 98.4 F (36.9 C)  TempSrc:      SpO2:  93% 98% 97%  Weight: 57.9 kg     Height:  No data found.   Intake/Output Summary (Last 24 hours) at 09/15/2022 1303 Last data filed at 09/15/2022 0416 Gross per 24 hour  Intake --  Output 300 ml  Net -300 ml   Filed Weights   09/13/22 1959 09/14/22 1516  Weight: 54 kg 57.9 kg    Exam:  GEN: NAD SKIN: Warm and dry EYES: No pallor or icterus ENT: MMM CV: RRR PULM: CTA B ABD: soft, ND, NT, +BS CNS: AAO x 3, non focal EXT: Right hip tenderness.  Swelling of the fingers of the right hand.  Splint on the right forearm        Data Reviewed:   I have personally reviewed following labs and imaging studies:  Labs: Labs show the following:   Basic Metabolic Panel: Recent Labs  Lab 09/13/22 2006 09/14/22 0349  NA 134* 136  K 4.0 4.8  CL 98 102  CO2 25 25  GLUCOSE 133* 154*  BUN 32* 35*  CREATININE 1.02* 0.94  CALCIUM 8.9 8.4*   GFR Estimated Creatinine Clearance:  38.5 mL/min (by C-G formula based on SCr of 0.94 mg/dL). Liver Function Tests: No results for input(s): "AST", "ALT", "ALKPHOS", "BILITOT", "PROT", "ALBUMIN" in the last 168 hours. No results for input(s): "LIPASE", "AMYLASE" in the last 168 hours. No results for input(s): "AMMONIA" in the last 168 hours. Coagulation profile Recent Labs  Lab 09/13/22 2006  INR 1.2    CBC: Recent Labs  Lab 09/13/22 2006 09/14/22 0349  WBC 10.0 12.5*  HGB 11.9* 9.9*  HCT 36.0 29.9*  MCV 95.2 94.9  PLT 160 137*   Cardiac Enzymes: No results for input(s): "CKTOTAL", "CKMB", "CKMBINDEX", "TROPONINI" in the last 168 hours. BNP (last 3 results) No results for input(s): "PROBNP" in the last 8760 hours. CBG: No results for input(s): "GLUCAP" in the last 168 hours. D-Dimer: No results for input(s): "DDIMER" in the last 72 hours. Hgb A1c: No results for input(s): "HGBA1C" in the last 72 hours. Lipid Profile: No results for input(s): "CHOL", "HDL", "LDLCALC", "TRIG", "CHOLHDL", "LDLDIRECT" in the last 72 hours. Thyroid function studies: No results for input(s): "TSH", "T4TOTAL", "T3FREE", "THYROIDAB" in the last 72 hours.  Invalid input(s): "FREET3" Anemia work up: No results for input(s): "VITAMINB12", "FOLATE", "FERRITIN", "TIBC", "IRON", "RETICCTPCT" in the last 72 hours. Sepsis Labs: Recent Labs  Lab 09/13/22 2006 09/14/22 0349  WBC 10.0 12.5*    Microbiology No results found for this or any previous visit (from the past 240 hour(s)).  Procedures and diagnostic studies:  DG Wrist 2 Views Right  Result Date: 09/14/2022 CLINICAL DATA:  Splint placement EXAM: RIGHT WRIST - 2 VIEW COMPARISON:  09/13/2022 FINDINGS: Improved alignment of radius and ulna fractures, with mild residual dorsal displacement at the radius fracture site. IMPRESSION: Improved alignment of radius and ulna fractures status post splint placement. Electronically Signed   By: Alejandra Wiley M.D.   On: 09/14/2022 00:54    DG Hip Unilat W or Wo Pelvis 2-3 Views Right  Result Date: 09/13/2022 CLINICAL DATA:  Right hip pain, fell EXAM: DG HIP (WITH OR WITHOUT PELVIS) 2-3V RIGHT COMPARISON:  09/12/2017 FINDINGS: Frontal view of the pelvis as well as frontal and cross-table lateral views of the right hip are obtained. Right hip and knee arthroplasty is again identified, with a periprosthetic proximal right femur fracture identified along the greater trochanter. Soft tissue swelling overlies the right hip. Minimally displaced fractures are also seen through the right superior and inferior pubic rami. No other acute pelvic  fractures. Stable left hip osteoarthritis. IMPRESSION: 1. Acute periprosthetic proximal right femur fracture, with fracture line extending through the greater trochanter. 2. Acute minimally displaced right superior and inferior pubic rami fractures. 3. Left hip osteoarthritis. Electronically Signed   By: Sharlet Salina M.D.   On: 09/13/2022 21:18   DG Wrist Complete Right  Result Date: 09/13/2022 CLINICAL DATA:  Trauma to the right wrist. EXAM: RIGHT WRIST - COMPLETE 3+ VIEW COMPARISON:  None Available. FINDINGS: Displaced fractures of the distal radius and ulna with dorsal angulation and displacement of the distal fracture fragments. The distal radial fracture appears to extend into the articular surface of the wrist. There is also fracture of the ulnar styloid. No dislocation. There is soft tissue swelling of the wrist. No opaque foreign object/catheterization. IMPRESSION: Displaced fractures of the distal radius and ulna. Electronically Signed   By: Elgie Collard M.D.   On: 09/13/2022 21:17               LOS: 2 days   Misti Towle  [facility]   Pager on [email or web address]. If 7PM-7AM, please contact night-coverage at [email or web address]     09/15/2022, 1:03 PM

## 2022-09-15 NOTE — Plan of Care (Signed)
  Problem: Activity: Goal: Risk for activity intolerance will decrease Outcome: Progressing   Problem: Nutrition: Goal: Adequate nutrition will be maintained Outcome: Progressing   Problem: Pain Managment: Goal: General experience of comfort will improve Outcome: Progressing   Problem: Safety: Goal: Ability to remain free from injury will improve Outcome: Progressing   Problem: Skin Integrity: Goal: Risk for impaired skin integrity will decrease Outcome: Progressing   

## 2022-09-15 NOTE — Anesthesia Preprocedure Evaluation (Signed)
Anesthesia Evaluation  Patient identified by MRN, date of birth, ID band Patient awake  General Assessment Comment:  Multiple ecchymoses in various stages of healing on face  Reviewed: Allergy & Precautions, H&P , NPO status , Patient's Chart, lab work & pertinent test results, reviewed documented beta blocker date and time   History of Anesthesia Complications (+) history of anesthetic complications  Airway Mallampati: III  TM Distance: >3 FB Neck ROM: full    Dental  (+) Chipped, Dental Advidsory Given   Pulmonary neg pulmonary ROS, neg shortness of breath, neg sleep apnea, neg COPD, neg recent URI, Patient abstained from smoking.Not current smoker   Pulmonary exam normal breath sounds clear to auscultation       Cardiovascular Exercise Tolerance: Good METS: 3 - Mets hypertension, (-) angina +CHF  (-) CAD, (-) Past MI and (-) Cardiac Stents + dysrhythmias Atrial Fibrillation + pacemaker (-) Valvular Problems/Murmurs Rhythm:Regular Rate:Normal  Good exercise tolerance, does yoga normally 2022 echo: 1. Left ventricular ejection fraction, by estimation, is 45%. The left  ventricle has mildly decreased function. The left ventricle demonstrates  global hypokinesis. Left ventricular diastolic parameters are  with Grade II diastolic dysfunction  (pseudonormalization).  2. Right ventricular systolic function is normal. The right ventricular  size is normal.  3. The mitral valve is normal in structure. Mild mitral valve  regurgitation.  4. The aortic valve is tricuspid. Aortic valve regurgitation is not  visualized.  5. The inferior vena cava is normal in size with greater than 50%  respiratory variability, suggesting right atrial pressure of 3 mmHg.    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 12/06/2003: Pacemaker     Comment:  Medtronic Impulse [id number] 2015: Pacemaker No date: Sick sinus syndrome (HCC) No date: Sinoatrial node dysfunction (HCC) 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  Past Surgical History: No date: APPENDECTOMY No date: HERNIA REPAIR     Comment:  lower abd 2015: IMAGE GUIDED SINUS SURGERY 12/06/2003: INSERT / REPLACE / REMOVE PACEMAKER     Comment:  Medtronic impulse [id number] No date: JOINT REPLACEMENT; Right     Comment:  knee 1999: LEEP 02/2016: ORIF HIP FRACTURE; Right 09/18/2012: PACEMAKER GENERATOR CHANGE; N/A     Comment:  Procedure: PACEMAKER GENERATOR CHANGE;  Surgeon: Duke Salvia, MD;  Location: [facility] CATH LAB;  Service:               Cardiovascular;  Laterality: N/A; No date: TONSILLECTOMY No date: TOTAL KNEE ARTHROPLASTY 09/23/2016: TOTAL KNEE REVISION; Right     Comment:  Procedure: TOTAL KNEE REVISION-POLYETHYLENE EXCHANGE;                Surgeon: Kennedy Bucker, MD;  Location: [facility] ORS;                Service: Orthopedics;  Laterality: Right; No date: VAGINAL HYSTERECTOMY  BMI    Body Mass Index:  20.09 kg/m  Reproductive/Obstetrics                             Anesthesia Physical Anesthesia Plan  ASA: 3  Anesthesia Plan: General   Post-op Pain Management: Ofirmev IV (intra-op)* and Regional block*   Induction: Intravenous  PONV Risk Score and Plan: 3 and Treatment may vary due to age or medical condition, Ondansetron and Dexamethasone  Airway Management Planned: LMA  Additional Equipment: None  Intra-op Plan:   Post-operative Plan: Extubation in OR  Informed Consent: I have reviewed the  patients History and Physical, chart, labs and discussed the procedure including the risks, benefits and alternatives for the proposed anesthesia with the patient or authorized representative who has indicated his/her understanding and acceptance.     Dental Advisory Given  Plan Discussed with: CRNA  Anesthesia Plan Comments: (Discussed risks of anesthesia with patient, including possibility of difficulty with spontaneous ventilation under anesthesia necessitating airway intervention, PONV, and rare risks such as cardiac or respiratory or neurological events, and allergic reactions. Discussed the role of CRNA in patient's perioperative care. Patient understands.  Has cardiologist note, optimized. Will place magnet over PPM  Discussed r/b/a of supraclavicular block, including elective nature. Will hold off til post-op at this time.  Risks discussed: - Rare: bleeding, infection, nerve damage - shortness of breath from hemidiaphragmatic paralysis - unilateral horner's syndrome - poor/non-working blocks - reactions and toxicity to local anesthetic Patient understands and agrees. )        Anesthesia Quick Evaluation

## 2022-09-15 NOTE — Transfer of Care (Signed)
Immediate Anesthesia Transfer of Care Note  Patient: Alejandra Wiley  Procedure(s) Performed: OPEN REDUCTION INTERNAL FIXATION (ORIF) DISTAL RADIUS FRACTURE (Right)  Patient Location: PACU  Anesthesia Type:General  Level of Consciousness: drowsy  Airway & Oxygen Therapy: Patient Spontanous Breathing and Patient connected to nasal cannula oxygen  Post-op Assessment: Report given to RN  Post vital signs: stable  Last Vitals:  Vitals Value Taken Time  BP 117/60 09/15/22 1726  Temp    Pulse 71 09/15/22 1728  Resp 15 09/15/22 1728  SpO2 94 % 09/15/22 1728  Vitals shown include unfiled device data.  Last Pain:  Vitals:   09/15/22 1414  TempSrc: Temporal  PainSc: 0-No pain      Patients Stated Pain Goal: 2 (09/14/22 1840)  Complications: No notable events documented.

## 2022-09-16 ENCOUNTER — Encounter: Payer: Self-pay | Admitting: Surgery

## 2022-09-16 DIAGNOSIS — S72114A Nondisplaced fracture of greater trochanter of right femur, initial encounter for closed fracture: Secondary | ICD-10-CM | POA: Insufficient documentation

## 2022-09-16 DIAGNOSIS — S72001A Fracture of unspecified part of neck of right femur, initial encounter for closed fracture: Secondary | ICD-10-CM | POA: Diagnosis not present

## 2022-09-16 LAB — CBC WITH DIFFERENTIAL/PLATELET
Abs Immature Granulocytes: 0.09 10*3/uL — ABNORMAL HIGH (ref 0.00–0.07)
Basophils Absolute: 0 10*3/uL (ref 0.0–0.1)
Basophils Relative: 0 %
Eosinophils Absolute: 0 10*3/uL (ref 0.0–0.5)
Eosinophils Relative: 0 %
HCT: 25.6 % — ABNORMAL LOW (ref 36.0–46.0)
Hemoglobin: 8.5 g/dL — ABNORMAL LOW (ref 12.0–15.0)
Immature Granulocytes: 1 %
Lymphocytes Relative: 7 %
Lymphs Abs: 0.5 10*3/uL — ABNORMAL LOW (ref 0.7–4.0)
MCH: 31.6 pg (ref 26.0–34.0)
MCHC: 33.2 g/dL (ref 30.0–36.0)
MCV: 95.2 fL (ref 80.0–100.0)
Monocytes Absolute: 0.5 10*3/uL (ref 0.1–1.0)
Monocytes Relative: 6 %
Neutro Abs: 7.1 10*3/uL (ref 1.7–7.7)
Neutrophils Relative %: 86 %
Platelets: 90 10*3/uL — ABNORMAL LOW (ref 150–400)
RBC: 2.69 MIL/uL — ABNORMAL LOW (ref 3.87–5.11)
RDW: 14.5 % (ref 11.5–15.5)
WBC: 8.2 10*3/uL (ref 4.0–10.5)
nRBC: 0 % (ref 0.0–0.2)

## 2022-09-16 LAB — BASIC METABOLIC PANEL
Anion gap: 3 — ABNORMAL LOW (ref 5–15)
BUN: 16 mg/dL (ref 8–23)
CO2: 24 mmol/L (ref 22–32)
Calcium: 7.4 mg/dL — ABNORMAL LOW (ref 8.9–10.3)
Chloride: 107 mmol/L (ref 98–111)
Creatinine, Ser: 0.65 mg/dL (ref 0.44–1.00)
GFR, Estimated: >60 mL/min (ref >60)
Glucose, Bld: 165 mg/dL — ABNORMAL HIGH (ref 70–99)
Potassium: 4.3 mmol/L (ref 3.5–5.1)
Sodium: 134 mmol/L — ABNORMAL LOW (ref 135–145)

## 2022-09-16 NOTE — Care Management Important Message (Signed)
Important Message  Patient Details  Name: Alejandra Wiley MRN: [id number] Date of Birth: April 01, 1932   Medicare Important Message Given:  Yes  I reviewed the Important Message from Medicare with the patient and her spouse in her room and they stated they understood these rights. I obtained signature on the initial IMM and will send to Health Information Management to be scanned into her medical record. I wished her a speedy recovery and thanked her for her time.   Olegario Messier A Jeziah Kretschmer 09/16/2022, 11:39 AM

## 2022-09-16 NOTE — Progress Notes (Addendum)
Physical Therapy Treatment Patient Details Name: Alejandra Wiley MRN: [id number] DOB: 1932-05-07 Today's Date: 09/16/2022   History of Present Illness Patient is a 89 y.o. female with medical history significant for anxiety, hypertension, CHF, aortic atherosclerosis, CVA, paroxysmal atrial fibrillation on Eliquis, R TKA, R reverse shoulder, and anxiety, who presented to the emergency room with acute onset of accidental mechanical fall. Pt is now disgnosed with Closed R hip fx, is also s/p ORIF of right distal radius fx.    PT Comments  Pt was pleasant and motivated to participate during the session and put forth good effort throughout. Pt moving much better this afternoon and less apprehensive compared to prior session, though ultimately still needing +2 assist with bed mobility. Pt performed STS x2 with platform walker, upon second stand pt was able to take a few steps towards [facility] with heavy encouragement and cues for LE sequencing. Pt denies concerns of dizziness this session. Pt will benefit from continued PT services upon discharge to safely address deficits listed in patient problem list for decreased caregiver assistance and eventual return to PLOF.    If plan is discharge home, recommend the following: A lot of help with walking and/or transfers;A lot of help with bathing/dressing/bathroom;Assist for transportation;Help with stairs or ramp for entrance   Can travel by private vehicle     No  Equipment Recommendations  Other (comment) (TBD at next venue of care)    Recommendations for Other Services       Precautions / Restrictions Precautions Precautions: Fall Restrictions Weight Bearing Restrictions: Yes RUE Weight Bearing: Non weight bearing RLE Weight Bearing: Partial weight bearing RLE Partial Weight Bearing Percentage or Pounds: 50%     Mobility  Bed Mobility Overal bed mobility: Needs Assistance Bed Mobility: Supine to Sit, Sit to Supine     Supine to sit: +2 for  physical assistance, +2 for safety/equipment, Mod assist Sit to supine: +2 for physical assistance, +2 for safety/equipment, Mod assist   General bed mobility comments: needs +2 due to overall apprehension, pt moving better this afternoon    Transfers Overall transfer level: Needs assistance Equipment used: Right platform walker Transfers: Sit to/from Stand Sit to Stand: From elevated surface, +2 Min assist           General transfer comment: Pt able to perform x2 STS with +2 min A, cues to keep RUE NWB. Pt needing heavy encouragement and reinforcement to initiate and sustain upward movement and positioning.    Ambulation/Gait   Gait Distance (Feet): 2 Feet   Gait Pattern/deviations: Shuffle, Step-to pattern       General Gait Details: Pt able to take 2-3 steps towards [facility] on second STS with cues for foot sequencing and encouragement   Stairs             Wheelchair Mobility     Tilt Bed    Modified Rankin (Stroke Patients Only)       Balance Overall balance assessment: Needs assistance Sitting-balance support: Feet supported, Single extremity supported Sitting balance-Leahy Scale: Fair     Standing balance support: Reliant on assistive device for balance, During functional activity, Single extremity supported Standing balance-Leahy Scale: Fair Standing balance comment: standing at platform walker                            Cognition Arousal: Alert Behavior During Therapy: WFL for tasks assessed/performed Overall Cognitive Status: Within Functional Limits for tasks assessed  General Comments: Pt tangential but pleasent        Exercises Total Joint Exercises Ankle Circles/Pumps: AROM, Strengthening, 10 reps, Both Quad Sets: AROM, Both, 10 reps, Strengthening Gluteal Sets: AROM, Both, 10 reps, Strengthening Hip ABduction/ADduction: AROM, Strengthening, Both, 10 reps    General Comments         Pertinent Vitals/Pain Pain Assessment Pain Assessment: Faces Faces Pain Scale: Hurts little more Pain Location: RLE, RUE, generalized Pain Descriptors / Indicators: Aching, Tender, Guarding, Grimacing, Moaning Pain Intervention(s): Monitored during session, Limited activity within patient's tolerance    Home Living Family/patient expects to be discharged to:: Private residence Living Arrangements: Spouse/significant other Available Help at Discharge: Family;Available PRN/intermittently Type of Home: Independent living facility Home Access: Level entry       Home Layout: One level Home Equipment: Cane - quad;Cane - single point;Grab bars - tub/shower;Grab bars - toilet      Prior Function            PT Goals (current goals can now be found in the care plan section) Acute Rehab PT Goals Patient Stated Goal: get stronger PT Goal Formulation: With patient Time For Goal Achievement: 09/29/22 Potential to Achieve Goals: Fair Progress towards PT goals: Progressing toward goals    Frequency    BID      PT Plan      Co-evaluation              AM-PAC PT "6 Clicks" Mobility   Outcome Measure  Help needed turning from your back to your side while in a flat bed without using bedrails?: A Little Help needed moving from lying on your back to sitting on the side of a flat bed without using bedrails?: A Little Help needed moving to and from a bed to a chair (including a wheelchair)?: A Lot Help needed standing up from a chair using your arms (e.g., wheelchair or bedside chair)?: A Lot Help needed to walk in hospital room?: A Lot Help needed climbing 3-5 steps with a railing? : A Lot 6 Click Score: 14    End of Session Equipment Utilized During Treatment: Gait belt Activity Tolerance: Patient limited by pain Patient left: in bed;with bed alarm set;with call bell/phone within reach Nurse Communication: Mobility status PT Visit Diagnosis: Other abnormalities of  gait and mobility (R26.89);Pain;Muscle weakness (generalized) (M62.81) Pain - Right/Left: Right Pain - part of body: Leg;Hip;Arm     Time: 1610-9604 PT Time Calculation (min) (ACUTE ONLY): 39 min  Charges:                         Cecile Sheerer, SPT 09/16/22, 3:33 PM This entire session was performed under direct supervision and direction of a licensed therapist/therapist assistant. I have personally read, edited and approve of the note as written.  Loran Senters, DPT

## 2022-09-16 NOTE — Evaluation (Addendum)
Physical Therapy Evaluation Patient Details Name: Alejandra Wiley MRN: [id number] DOB: 1932/09/20 Today's Date: 09/16/2022  History of Present Illness  Patient is a 89 y.o. female with medical history significant for anxiety, hypertension, CHF, aortic atherosclerosis, CVA, paroxysmal atrial fibrillation on Eliquis, R TKA, R reverse shoulder, and anxiety, who presented to the emergency room with acute onset of accidental mechanical fall. Pt is now disgnosed with Closed R hip fx, is also s/p ORIF of right distal radius fx.  Clinical Impression  Pt was pleasant and motivated to participate during the session and put forth good effort throughout. Pt is currently +2 assist for bed mobility secondary to pt's apprehension and pain despite being premedicated before session. Pt requiring Consistent Mod A for STS with R platform walker. Pt only able to manage 1 half step with RLE to side before electing to sit down secondary to dizziness and pain. Pt's seated BP at beginning of session was 100's/60's, upon standing pt dropping to 66/55 with pt reporting feeling dizzy and weak, promptly sat pt back down and +2 assist to lay pt back down. Final BP readings of 97/49, nursing made aware of BP changes. Pt will benefit from continued PT services upon discharge to safely address deficits listed in patient problem list for decreased caregiver assistance and eventual return to PLOF.       If plan is discharge home, recommend the following: A lot of help with walking and/or transfers;A lot of help with bathing/dressing/bathroom;Assist for transportation;Help with stairs or ramp for entrance   Can travel by private vehicle   No    Equipment Recommendations Other (comment) (TBD at next venue of care)  Recommendations for Other Services       Functional Status Assessment Patient has had a recent decline in their functional status and demonstrates the ability to make significant improvements in function in a  reasonable and predictable amount of time.     Precautions / Restrictions Precautions Precautions: Fall Restrictions Weight Bearing Restrictions: Yes RUE Weight Bearing: Non weight bearing RLE Weight Bearing: Partial weight bearing RLE Partial Weight Bearing Percentage or Pounds: 50% , per chat with Ortho MD     Mobility  Bed Mobility Overal bed mobility: Needs Assistance Bed Mobility: Supine to Sit, Sit to Supine     Supine to sit: +2 for physical assistance, +2 for safety/equipment, Mod assist Sit to supine: +2 for physical assistance, +2 for safety/equipment, Mod assist   General bed mobility comments: needs +2 due to overall pain levels and apprehension    Transfers Overall transfer level: Needs assistance Equipment used: Right platform walker Transfers: Sit to/from Stand Sit to Stand: Mod assist, From elevated surface           General transfer comment: Once standing pt only able to weight sift with 1 half step with RLE,    Ambulation/Gait                  Stairs            Wheelchair Mobility     Tilt Bed    Modified Rankin (Stroke Patients Only)       Balance Overall balance assessment: Needs assistance Sitting-balance support: Feet supported, Single extremity supported Sitting balance-Leahy Scale: Fair     Standing balance support: Reliant on assistive device for balance, During functional activity, Single extremity supported Standing balance-Leahy Scale: Poor Standing balance comment: standing at platform walker  Pertinent Vitals/Pain Pain Assessment Pain Assessment: Faces Faces Pain Scale: Hurts whole lot Pain Location: RLE, RUE, generalized Pain Descriptors / Indicators: Aching, Tender, Guarding, Grimacing, Moaning Pain Intervention(s): Monitored during session, Limited activity within patient's tolerance    Home Living Family/patient expects to be discharged to:: Private  residence Living Arrangements: Spouse/significant other Available Help at Discharge: Family;Available PRN/intermittently Type of Home: Independent living facility Home Access: Level entry       Home Layout: One level Home Equipment: Cane - quad;Cane - single point;Grab bars - tub/shower;Grab bars - toilet      Prior Function Prior Level of Function : Independent/Modified Independent             Mobility Comments: Normally Ind, walking with no AD household/facility distances ADLs Comments: Ind     Extremity/Trunk Assessment   Upper Extremity Assessment Upper Extremity Assessment: Generalized weakness;RUE deficits/detail RUE Deficits / Details: NWB, s/p ORIF RUE: Unable to fully assess due to pain    Lower Extremity Assessment Lower Extremity Assessment: Generalized weakness;RLE deficits/detail RLE Deficits / Details: Nondisplaced closed fx       Communication   Communication Communication: No apparent difficulties  Cognition Arousal: Alert Behavior During Therapy: WFL for tasks assessed/performed Overall Cognitive Status: Within Functional Limits for tasks assessed                                          General Comments      Exercises     Assessment/Plan    PT Assessment Patient needs continued PT services  PT Problem List Decreased strength;Decreased coordination;Decreased range of motion;Decreased activity tolerance;Decreased balance       PT Treatment Interventions DME instruction;Balance training;Gait training;Stair training;Functional mobility training;Therapeutic activities;Therapeutic exercise    PT Goals (Current goals can be found in the Care Plan section)  Acute Rehab PT Goals Patient Stated Goal: get stronger PT Goal Formulation: With patient Time For Goal Achievement: 09/29/22 Potential to Achieve Goals: Fair    Frequency BID     Co-evaluation               AM-PAC PT "6 Clicks" Mobility  Outcome Measure  Help needed turning from your back to your side while in a flat bed without using bedrails?: A Lot Help needed moving from lying on your back to sitting on the side of a flat bed without using bedrails?: A Lot Help needed moving to and from a bed to a chair (including a wheelchair)?: A Lot Help needed standing up from a chair using your arms (e.g., wheelchair or bedside chair)?: A Lot Help needed to walk in hospital room?: A Lot Help needed climbing 3-5 steps with a railing? : A Lot 6 Click Score: 12    End of Session Equipment Utilized During Treatment: Gait belt Activity Tolerance: Patient limited by pain Patient left: in bed;with bed alarm set;with family/visitor present;with call bell/phone within reach Nurse Communication: Mobility status PT Visit Diagnosis: Other abnormalities of gait and mobility (R26.89);Pain;Muscle weakness (generalized) (M62.81) Pain - Right/Left: Right Pain - part of body: Leg;Hip;Arm    Time: 3086-5784 PT Time Calculation (min) (ACUTE ONLY): 61 min   Charges:                Cecile Sheerer, SPT 09/16/22, 12:26 PM This entire session was performed under direct supervision and direction of a licensed therapist/therapist assistant. I have personally read, edited  and approve of the note as written.  Loran Senters, DPT

## 2022-09-16 NOTE — Progress Notes (Signed)
Progress Note    Alejandra Wiley  ZOX:[id number] DOB: 1932/04/02  DOA: 09/13/2022 PCP: Lauro Regulus, MD      Brief Narrative:    Medical records reviewed and are as summarized below:  Alejandra Wiley is a 89 y.o. female with medical history significant for anxiety, hypertension, CHF, aortic atherosclerosis, CVA, paroxysmal atrial fibrillation on Eliquis anxiety, who presented to the hospital because of right hip pain after a mechanical fall  She was found to have close right hip fracture, right wrist fracture involving the right radius on the right.    Assessment/Plan:   Principal Problem:   Closed right hip fracture [facility]) Active Problems:   Right wrist fracture   Paroxysmal atrial fibrillation (HCC)   Fall at home, initial encounter   Essential hypertension   Depression   Closed fracture of right radius and ulna   Malnutrition of moderate degree   Nutrition Problem: Moderate Malnutrition Etiology: social / environmental circumstances  Signs/Symptoms: moderate fat depletion, moderate muscle depletion   Body mass index is 21.24 kg/m.    S/p mechanical fall at home, right distal radius fracture, right distal ulna fracture, closed right hip fracture: S/p ORIF right distal radius fracture on 09/15/2022.  Continue analgesics as needed for pain.  Conservative management was recommended for right hip fracture. PT recommended discharge to SNF.   Paroxysmal atrial fibrillation: Eliquis has been resumed    Other comorbidities include depression, hypertension    Diet Order             Diet regular Room service appropriate? Yes; Fluid consistency: Thin  Diet effective now                            Consultants: Orthopedic surgeon  Procedures: S/p ORIF right radius fracture on 09/15/2022    Medications:    amiodarone  200 mg Oral Daily   amLODipine  2.5 mg Oral Daily   apixaban  2.5 mg Oral BID   docusate sodium  100 mg Oral  BID   feeding supplement  237 mL Oral BID BM   multivitamin with minerals  1 tablet Oral Daily   propranolol ER  80 mg Oral Daily   Continuous Infusions:     Anti-infectives (From admission, onward)    Start     Dose/Rate Route Frequency Ordered Stop   09/15/22 0719  ceFAZolin (ANCEF) IVPB 2g/100 mL premix        2 g 200 mL/hr over 30 Minutes Intravenous 30 min pre-op 09/15/22 0719 09/15/22 1536              Family Communication/Anticipated D/C date and plan/Code Status   DVT prophylaxis: apixaban (ELIQUIS) tablet 2.5 mg Start: 09/16/22 1000 SCDs Start: 09/15/22 1954 Place TED hose Start: 09/15/22 1954 apixaban (ELIQUIS) tablet 2.5 mg     Code Status: DNR  Family Communication: Plan discussed with her husband and son at the bedside Disposition Plan: Plan to discharge to SNF   Status is: Inpatient Remains inpatient appropriate because: Right hip and wrist fracture       Subjective:   No acute events overnight.  Pain in right hand is better.  She still has pain in the right hip especially with movement.  Her husband and son were at the bedside.  Physical therapists were also at the bedside.  Objective:    Vitals:   09/16/22 0403 09/16/22 0500 09/16/22 0816 09/16/22 1156  BP: 124/72  (!) 142/69 (!) 93/58  Pulse: 76  85 75  Resp: 16  16 17   Temp: 97.7 F (36.5 C)  97.6 F (36.4 C) 97.8 F (36.6 C)  TempSrc:      SpO2: 99%  100% 95%  Weight:  59.7 kg    Height:       No data found.   Intake/Output Summary (Last 24 hours) at 09/16/2022 1435 Last data filed at 09/16/2022 0817 Gross per 24 hour  Intake 4115.47 ml  Output 2050 ml  Net 2065.47 ml   Filed Weights   09/13/22 1959 09/14/22 1516 09/16/22 0500  Weight: 54 kg 57.9 kg 59.7 kg    Exam:  GEN: NAD SKIN: Warm and dry EYES: No pallor or icterus ENT: MMM CV: RRR PULM: CTA B ABD: soft, ND, NT, +BS CNS: AAO x 3, non focal EXT: Right hip tenderness.  Swelling of the fingers of right  hand has improved.  Dressing on right forearm/hand is clean, dry and intact       Data Reviewed:   I have personally reviewed following labs and imaging studies:  Labs: Labs show the following:   Basic Metabolic Panel: Recent Labs  Lab 09/13/22 2006 09/14/22 0349 09/16/22 0422  NA 134* 136 134*  K 4.0 4.8 4.3  CL 98 102 107  CO2 25 25 24   GLUCOSE 133* 154* 165*  BUN 32* 35* 16  CREATININE 1.02* 0.94 0.65  CALCIUM 8.9 8.4* 7.4*   GFR Estimated Creatinine Clearance: 46.4 mL/min (by C-G formula based on SCr of 0.65 mg/dL). Liver Function Tests: No results for input(s): "AST", "ALT", "ALKPHOS", "BILITOT", "PROT", "ALBUMIN" in the last 168 hours. No results for input(s): "LIPASE", "AMYLASE" in the last 168 hours. No results for input(s): "AMMONIA" in the last 168 hours. Coagulation profile Recent Labs  Lab 09/13/22 2006  INR 1.2    CBC: Recent Labs  Lab 09/13/22 2006 09/14/22 0349 09/16/22 0422  WBC 10.0 12.5* 8.2  NEUTROABS  --   --  7.1  HGB 11.9* 9.9* 8.5*  HCT 36.0 29.9* 25.6*  MCV 95.2 94.9 95.2  PLT 160 137* 90*   Cardiac Enzymes: No results for input(s): "CKTOTAL", "CKMB", "CKMBINDEX", "TROPONINI" in the last 168 hours. BNP (last 3 results) No results for input(s): "PROBNP" in the last 8760 hours. CBG: No results for input(s): "GLUCAP" in the last 168 hours. D-Dimer: No results for input(s): "DDIMER" in the last 72 hours. Hgb A1c: No results for input(s): "HGBA1C" in the last 72 hours. Lipid Profile: No results for input(s): "CHOL", "HDL", "LDLCALC", "TRIG", "CHOLHDL", "LDLDIRECT" in the last 72 hours. Thyroid function studies: No results for input(s): "TSH", "T4TOTAL", "T3FREE", "THYROIDAB" in the last 72 hours.  Invalid input(s): "FREET3" Anemia work up: No results for input(s): "VITAMINB12", "FOLATE", "FERRITIN", "TIBC", "IRON", "RETICCTPCT" in the last 72 hours. Sepsis Labs: Recent Labs  Lab 09/13/22 2006 09/14/22 0349 09/16/22 0422   WBC 10.0 12.5* 8.2    Microbiology No results found for this or any previous visit (from the past 240 hour(s)).  Procedures and diagnostic studies:  No results found.             LOS: 3 days   [name]  [facility]   Pager on [email or web address]. If 7PM-7AM, please contact night-coverage at [email or web address]     09/16/2022, 2:35 PM

## 2022-09-16 NOTE — Plan of Care (Signed)
  Problem: Clinical Measurements: Goal: Diagnostic test results will improve Outcome: Progressing   Problem: Activity: Goal: Risk for activity intolerance will decrease Outcome: Progressing   Problem: Coping: Goal: Level of anxiety will decrease Outcome: Progressing   Problem: Elimination: Goal: Will not experience complications related to bowel motility Outcome: Progressing Goal: Will not experience complications related to urinary retention Outcome: Progressing

## 2022-09-17 DIAGNOSIS — S72001A Fracture of unspecified part of neck of right femur, initial encounter for closed fracture: Secondary | ICD-10-CM | POA: Diagnosis not present

## 2022-09-17 LAB — CBC
HCT: 27.2 % — ABNORMAL LOW (ref 36.0–46.0)
Hemoglobin: 9 g/dL — ABNORMAL LOW (ref 12.0–15.0)
MCH: 32 pg (ref 26.0–34.0)
MCHC: 33.1 g/dL (ref 30.0–36.0)
MCV: 96.8 fL (ref 80.0–100.0)
Platelets: 126 10*3/uL — ABNORMAL LOW (ref 150–400)
RBC: 2.81 MIL/uL — ABNORMAL LOW (ref 3.87–5.11)
RDW: 14.7 % (ref 11.5–15.5)
WBC: 12.5 10*3/uL — ABNORMAL HIGH (ref 4.0–10.5)
nRBC: 0.4 % — ABNORMAL HIGH (ref 0.0–0.2)

## 2022-09-17 MED ORDER — HYDROCODONE-ACETAMINOPHEN 5-325 MG PO TABS: 1.0000 | ORAL_TABLET | Freq: Four times a day (QID) | ORAL | 0 refills | Status: DC | PRN

## 2022-09-17 NOTE — Discharge Instructions (Signed)
Diet: As you were doing prior to hospitalization   Shower:  May shower but keep the wounds dry, use an occlusive plastic wrap, NO SOAKING IN TUB.  If the bandage gets wet, change with a clean dry gauze.  Dressing:  Keep splint on until first post-op appointment.  Activity:  Increase activity slowly as tolerated, but follow the weight bearing instructions below.  No lifting or driving for 6 weeks.  Weight Bearing:   Non-weightbearing to the right arm.  Partial weightbearing to the right leg.  To prevent constipation: you may use a stool softener such as -  Colace (over the counter) 100 mg by mouth twice a day  Drink plenty of fluids (prune juice may be helpful) and high fiber foods Miralax (over the counter) for constipation as needed.    Itching:  If you experience itching with your medications, try taking only a single pain pill, or even half a pain pill at a time.  You may take up to 10 pain pills per day, and you can also use benadryl over the counter for itching or also to help with sleep.   Precautions:  If you experience chest pain or shortness of breath - call 911 immediately for transfer to the hospital emergency department!!  If you develop a fever greater that 101 F, purulent drainage from wound, increased redness or drainage from wound, or calf pain-Call [facility]                                              Follow- Up Appointment:  Please call for an appointment to be seen in 2 weeks at [facility]

## 2022-09-17 NOTE — Progress Notes (Signed)
Physical Therapy Treatment Patient Details Name: Alejandra Wiley MRN: [id number] DOB: Jan 02, 1933 Today's Date: 09/17/2022   History of Present Illness Patient is a 89 y.o. female with medical history significant for anxiety, hypertension, CHF, aortic atherosclerosis, CVA, paroxysmal atrial fibrillation on Eliquis, R TKA, R reverse shoulder, and anxiety, who presented to the emergency room with acute onset of accidental mechanical fall. Pt is now disgnosed with Closed R hip fx, is also s/p ORIF of right distal radius fx.    PT Comments  This afternoon's session was performed with a focus on therapeutic exercises with the morning session focused on functional mobility.  Pt tolerated below graded therex well with manual resistance provided as tolerated and frequent therapeutic rest breaks given.  Pt reported no adverse symptoms during the session other than mild to moderate R hip pain with SpO2 and HR WNL on room air.  Pt will benefit from continued PT services upon discharge to safely address deficits listed in patient problem list for decreased caregiver assistance and eventual return to PLOF.     If plan is discharge home, recommend the following: A lot of help with walking and/or transfers;A lot of help with bathing/dressing/bathroom;Assist for transportation;Help with stairs or ramp for entrance   Can travel by private vehicle     No  Equipment Recommendations  Other (comment) (TBD at next venue of care)    Recommendations for Other Services       Precautions / Restrictions Precautions Precautions: Fall Restrictions Weight Bearing Restrictions: Yes RUE Weight Bearing: Non weight bearing RLE Weight Bearing: Partial weight bearing RLE Partial Weight Bearing Percentage or Pounds: 50%     Mobility  Bed Mobility Overal bed mobility: Needs Assistance       Supine to sit: +2 for physical assistance, Mod assist Sit to supine: +2 for physical assistance, Mod assist   General bed  mobility comments: NT this session    Transfers Overall transfer level: Needs assistance Equipment used: Right platform walker Transfers: Sit to/from Stand Sit to Stand: From elevated surface, Min assist, +2 physical assistance           General transfer comment: Pt able to perform STS x 2 with +2 min A and verbal/tactile cues for sequencing for RUE WB compliance and to encourage use of RLE    Ambulation/Gait               General Gait Details: Pt able to advance RLE with physical assistance but unable to advance LLE   Stairs             Wheelchair Mobility     Tilt Bed    Modified Rankin (Stroke Patients Only)       Balance Overall balance assessment: Needs assistance Sitting-balance support: Feet supported, Single extremity supported Sitting balance-Leahy Scale: Fair     Standing balance support: Bilateral upper extremity supported Standing balance-Leahy Scale: Fair Standing balance comment: standing at platform walker                            Cognition Arousal: Alert Behavior During Therapy: WFL for tasks assessed/performed Overall Cognitive Status: Within Functional Limits for tasks assessed                                          Exercises Total Joint Exercises Ankle Circles/Pumps: Strengthening, Both,  5 reps, 10 reps (with manual resistance) Quad Sets: Strengthening, Both, 5 reps, 10 reps Gluteal Sets: Strengthening, Both, 5 reps, 10 reps Towel Squeeze: Strengthening, Both, 10 reps Short Arc Quad: Strengthening, Both, 5 reps, 10 reps (with gentle manual resistance) Hip ABduction/ADduction: AAROM, Strengthening, Right, 5 reps Straight Leg Raises: AAROM, Strengthening, 5 reps, 10 reps, Both Other Exercises Other Exercises: HEP education/review for BLE APs, QS, and GS x 10 each every 1-2 hours daily    General Comments        Pertinent Vitals/Pain Pain Assessment Pain Assessment: 0-10 Pain Score: 1   Pain Location: RLE, RUE Pain Descriptors / Indicators: Sore Pain Intervention(s): Premedicated before session, Monitored during session    Home Living                          Prior Function            PT Goals (current goals can now be found in the care plan section) Progress towards PT goals: Progressing toward goals    Frequency    BID      PT Plan      Co-evaluation              AM-PAC PT "6 Clicks" Mobility   Outcome Measure  Help needed turning from your back to your side while in a flat bed without using bedrails?: A Lot Help needed moving from lying on your back to sitting on the side of a flat bed without using bedrails?: A Lot Help needed moving to and from a bed to a chair (including a wheelchair)?: A Lot Help needed standing up from a chair using your arms (e.g., wheelchair or bedside chair)?: A Little Help needed to walk in hospital room?: Total Help needed climbing 3-5 steps with a railing? : Total 6 Click Score: 11    End of Session Equipment Utilized During Treatment: Gait belt Activity Tolerance: Patient tolerated treatment well Patient left: in bed;with bed alarm set;with call bell/phone within reach;with SCD's reapplied Nurse Communication: Mobility status PT Visit Diagnosis: Other abnormalities of gait and mobility (R26.89);Pain;Muscle weakness (generalized) (M62.81) Pain - Right/Left: Right Pain - part of body: Leg;Hip;Arm     Time: 1914-7829 PT Time Calculation (min) (ACUTE ONLY): 24 min  Charges:    $Therapeutic Exercise: 23-37 mins PT General Charges $$ ACUTE PT VISIT: 1 Visit                     D. Scott Shalicia Craghead PT, DPT 09/17/22, 3:51 PM

## 2022-09-17 NOTE — [place] FL2 (Signed)
[place] MEDICAID FL2 LEVEL OF CARE FORM     IDENTIFICATION  Patient Name: Alejandra Wiley Birthdate: 02/12/1932 Sex: female Admission Date (Current Location): 09/13/2022  [place] and [place] Number:  Chiropodist and Address:  [facility], [address] [place], [place] [address]      Provider Number: [id number]  Attending Physician Name and Address:  Lurene Shadow, MD  Relative Name and Phone Number:  Gertha Calkin [phone]    Current Level of Care: Hospital Recommended Level of Care: Skilled Nursing Facility Prior Approval Number:    Date Approved/Denied:   PASRR Number: [id number]  Discharge Plan: SNF    Current Diagnoses: Patient Active Problem List   Diagnosis Date Noted   Malnutrition of moderate degree 09/15/2022   Fall at home, initial encounter 09/14/2022   Right wrist fracture 09/14/2022   Essential hypertension 09/14/2022   Depression 09/14/2022   Paroxysmal atrial fibrillation (HCC) 09/14/2022   Closed fracture of right radius and ulna 09/14/2022   Closed right hip fracture (HCC) 09/13/2022   Proximal humerus fracture 04/09/2021   Acute HFrEF (heart failure with reduced ejection fraction) (HCC)    Atrial fibrillation with rapid ventricular response (HCC) 05/19/2020   Atrial fibrillation with RVR (HCC) 05/17/2020   Generalized weakness 05/17/2020   Chronic systolic (congestive) heart failure (HCC) 03/31/2018   Arm weakness 02/09/2018   UTI (urinary tract infection) 11/28/2017   Protein calorie malnutrition (HCC) 10/10/2017   Falls 10/10/2017   Weakness 10/10/2017   Muscle atrophy 10/10/2017   Sinus node dysfunction (HCC) 10/10/2017   Syncope and collapse 09/12/2017   Pyelonephritis 08/26/2017   Chronic diastolic heart failure (HCC) 07/19/2017   Hypertension 07/19/2017   Painful total knee replacement (HCC) 09/23/2016   Fungal sinusitis 11/08/2013   ATRIAL FIBRILLATION 06/13/2008   PACEMAKER-Medtronic  06/13/2008    Orientation RESPIRATION BLADDER Height & Weight     Self, Time, Situation, Place  Normal Continent, External catheter Weight: 58.9 kg Height:  5\' 6"  (167.6 cm)  BEHAVIORAL SYMPTOMS/MOOD NEUROLOGICAL BOWEL NUTRITION STATUS      Continent Diet (See DC Summary)  AMBULATORY STATUS COMMUNICATION OF NEEDS Skin   Extensive Assist Verbally Normal, Surgical wounds                       Personal Care Assistance Level of Assistance  Bathing, Feeding, Dressing Bathing Assistance: Limited assistance Feeding assistance: Limited assistance Dressing Assistance: Maximum assistance     Functional Limitations Info  Sight, Hearing, Speech Sight Info: Adequate Hearing Info: Adequate Speech Info: Adequate    SPECIAL CARE FACTORS FREQUENCY  PT (By licensed PT), OT (By licensed OT)     PT Frequency: 5 times per week OT Frequency: 5 times per week            Contractures Contractures Info: Not present    Additional Factors Info  Code Status, Allergies Code Status Info: DNR Allergies Info: Latex, Other, Tape, Xarelto (Rivaroxaban), Nickel           Current Medications (09/17/2022):  This is the current hospital active medication list Current Facility-Administered Medications  Medication Dose Route Frequency Provider Last Rate Last Admin   acetaminophen (TYLENOL) tablet 325-650 mg  325-650 mg Oral Q6H PRN Poggi, Excell Seltzer, MD       amiodarone (PACERONE) tablet 200 mg  200 mg Oral Daily Poggi, Excell Seltzer, MD   200 mg at 09/17/22 0955   amLODipine (NORVASC) tablet 2.5 mg  2.5  mg Oral Daily Poggi, Excell Seltzer, MD   2.5 mg at 09/17/22 3664   apixaban (ELIQUIS) tablet 2.5 mg  2.5 mg Oral BID Christena Flake, MD   2.5 mg at 09/17/22 4034   bisacodyl (DULCOLAX) suppository 10 mg  10 mg Rectal Daily PRN Poggi, Excell Seltzer, MD       diphenhydrAMINE (BENADRYL) 12.5 MG/5ML elixir 12.5-25 mg  12.5-25 mg Oral Q4H PRN Poggi, Excell Seltzer, MD       docusate sodium (COLACE) capsule 100 mg  100 mg Oral BID  Poggi, Excell Seltzer, MD   100 mg at 09/17/22 7425   feeding supplement (ENSURE ENLIVE / ENSURE PLUS) liquid 237 mL  237 mL Oral BID BM Poggi, Excell Seltzer, MD   237 mL at 09/17/22 1002   HYDROcodone-acetaminophen (NORCO/VICODIN) 5-325 MG per tablet 1-2 tablet  1-2 tablet Oral Q6H PRN Poggi, Excell Seltzer, MD   1 tablet at 09/17/22 0955   magnesium hydroxide (MILK OF MAGNESIA) suspension 30 mL  30 mL Oral Daily PRN Christena Flake, MD   30 mL at 09/14/22 1853   magnesium hydroxide (MILK OF MAGNESIA) suspension 30 mL  30 mL Oral Daily PRN Poggi, Excell Seltzer, MD   30 mL at 09/17/22 0533   metoCLOPramide (REGLAN) tablet 5-10 mg  5-10 mg Oral Q8H PRN Poggi, Excell Seltzer, MD       Or   metoCLOPramide (REGLAN) injection 5-10 mg  5-10 mg Intravenous Q8H PRN Poggi, Excell Seltzer, MD       morphine (PF) 2 MG/ML injection 0.5 mg  0.5 mg Intravenous Q2H PRN Poggi, Excell Seltzer, MD   0.5 mg at 09/14/22 0126   multivitamin with minerals tablet 1 tablet  1 tablet Oral Daily Poggi, Excell Seltzer, MD   1 tablet at 09/17/22 0955   ondansetron (ZOFRAN) tablet 4 mg  4 mg Oral Q6H PRN Poggi, Excell Seltzer, MD       Or   ondansetron (ZOFRAN) injection 4 mg  4 mg Intravenous Q6H PRN Poggi, Excell Seltzer, MD       propranolol ER (INDERAL LA) 24 hr capsule 80 mg  80 mg Oral Daily Poggi, Excell Seltzer, MD   80 mg at 09/17/22 9563   sodium phosphate (FLEET) enema 1 enema  1 enema Rectal Once PRN Poggi, Excell Seltzer, MD       traZODone (DESYREL) tablet 25 mg  25 mg Oral QHS PRN Poggi, Excell Seltzer, MD         Discharge Medications: Please see discharge summary for a list of discharge medications.  Relevant Imaging Results:  Relevant Lab Results:   Additional Information SSN:[id number]  Marlowe Sax, RN

## 2022-09-17 NOTE — Progress Notes (Signed)
Subjective: 2 Days Post-Op Procedure(s) (LRB): OPEN REDUCTION INTERNAL FIXATION (ORIF) DISTAL RADIUS FRACTURE (Right) Nondisplaced periprosthetic greater trochanteric right hip fracture. Patient reports pain as  mild in the right wrist, worse in the hip with ambulation .   Patient is well, and has had no acute complaints or problems PT and care management to assist with discharge planning. She is a resident at [facility], will need SNF following discharge Negative for chest pain and shortness of breath Fever: no Gastrointestinal:Negative for nausea and vomiting   Objective: Vital signs in last 24 hours: Temp:  [97.6 F (36.4 C)-98.2 F (36.8 C)] 98.2 F (36.8 C) (08/29 2258) Pulse Rate:  [71-85] 71 (08/29 2258) Resp:  [16-18] 18 (08/29 2258) BP: (93-142)/(58-75) 117/60 (08/29 2258) SpO2:  [93 %-100 %] 95 % (08/29 2258) Weight:  [58.9 kg] 58.9 kg (08/30 0500)  Intake/Output from previous day:  Intake/Output Summary (Last 24 hours) at 09/17/2022 0741 Last data filed at 09/17/2022 0500 Gross per 24 hour  Intake 888.36 ml  Output 1500 ml  Net -611.64 ml    Intake/Output this shift: No intake/output data recorded.  Labs: Recent Labs    09/16/22 0422 09/17/22 0615  HGB 8.5* 9.0*   Recent Labs    09/16/22 0422 09/17/22 0615  WBC 8.2 12.5*  RBC 2.69* 2.81*  HCT 25.6* 27.2*  PLT 90* 126*   Recent Labs    09/16/22 0422  NA 134*  K 4.3  CL 107  CO2 24  BUN 16  CREATININE 0.65  GLUCOSE 165*  CALCIUM 7.4*   No results for input(s): "LABPT", "INR" in the last 72 hours.   EXAM General - Patient is Alert, Appropriate, and Oriented Extremity - ABD soft Neurovascular intact Dorsiflexion/Plantar flexion intact No cellulitis present Compartment soft Splint intact to the right arm.  Swelling improving, she is able to flex and extend fingers this morning. Moderate ecchymosis still present to fingers Motor Function - intact, moving foot and toes well on exam as  well as fingers. Intact bowel sounds this morning.  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 Rosita Kea during a back surgery in approx 2019.   Hypertension    Long term current use of anticoagulant    a.) dose reduced apixaban   MRSA nasal colonization 09/17/2016   Pacemaker 12/06/2003   Medtronic Impulse [id number]   Pacemaker 2015   PAF (paroxysmal atrial fibrillation) (HCC)    a.) CHA2DS2-VASc = 8 (age x 2, sex, CHF, HTN, CVA x 2, aortic plaque). b.) rate/rhythm maintained on oral amiodarone + metoprolol succinate; chronically anticoagulated with dose reduced apixaban   Sick sinus syndrome (HCC)    Sinoatrial node dysfunction (HCC)    Stroke (HCC)    Vaginal intraepithelial neoplasia    VAIN (vaginal intraepithelial neoplasia)     Assessment/Plan: 2 Days Post-Op Procedure(s) (LRB): OPEN REDUCTION INTERNAL FIXATION (ORIF) DISTAL RADIUS FRACTURE (Right) Principal Problem:   Closed right hip fracture (HCC) Active Problems:   Fall at home, initial encounter   Right wrist fracture   Essential hypertension   Depression   Paroxysmal atrial fibrillation (HCC)   Closed fracture of right radius  and ulna   Malnutrition of moderate degree  Estimated body mass index is 20.96 kg/m as calculated from the following:   Height as of this encounter: 5\' 6"  (1.676 m).   Weight as of this encounter: 58.9 kg. Advance diet Up with therapy D/C IV fluids when tolerating po intake.  Labs and vitals reviewed this AM.  WBC 12.5 this morning. Up with therapy today. Will need SNF following discharge, currently resident at [facility].  Following discharge, continue with chronic  eliquis. Follow-up with [facility] in 10-14 days for splint removal and x-rays of the right hip and wrist.  DVT Prophylaxis - TED hose and Eliquis Non-weightbearing to the right arm. Partial WB to the right leg.  Valeria Batman, PA-C [facility] 09/17/2022, 7:41 AM

## 2022-09-17 NOTE — Plan of Care (Signed)
  Problem: Health Behavior/Discharge Planning: Goal: Ability to manage health-related needs will improve Outcome: Progressing   Problem: Clinical Measurements: Goal: Respiratory complications will improve Outcome: Progressing   Problem: Elimination: Goal: Will not experience complications related to bowel motility Outcome: Progressing

## 2022-09-17 NOTE — Progress Notes (Signed)
Physical Therapy Treatment Patient Details Name: Alejandra Wiley MRN: [id number] DOB: 1932/12/27 Today's Date: 09/17/2022   History of Present Illness Patient is a 89 y.o. female with medical history significant for anxiety, hypertension, CHF, aortic atherosclerosis, CVA, paroxysmal atrial fibrillation on Eliquis, R TKA, R reverse shoulder, and anxiety, who presented to the emergency room with acute onset of accidental mechanical fall. Pt is now disgnosed with Closed R hip fx, is also s/p ORIF of right distal radius fx.    PT Comments  Pt was pleasant and motivated to participate during the session and put forth good effort throughout. Pt required physical assistance with all below functional tasks along with cuing for sequencing most notably to prevent inappropriate use of RUE and to encourage use of RLE during bed mobility tasks and transfers.  Pt reported dizziness during standing trials with seated BP taken at 143/98, SpO2 and HR WNL on room air.  Pt will benefit from continued PT services upon discharge to safely address deficits listed in patient problem list for decreased caregiver assistance and eventual return to PLOF.      If plan is discharge home, recommend the following: A lot of help with walking and/or transfers;A lot of help with bathing/dressing/bathroom;Assist for transportation;Help with stairs or ramp for entrance   Can travel by private vehicle     No  Equipment Recommendations  Other (comment) (TBD at next venue of care)    Recommendations for Other Services       Precautions / Restrictions Precautions Precautions: Fall Restrictions Weight Bearing Restrictions: Yes RUE Weight Bearing: Non weight bearing RLE Weight Bearing: Weight bearing as tolerated RLE Partial Weight Bearing Percentage or Pounds: 50%     Mobility  Bed Mobility Overal bed mobility: Needs Assistance       Supine to sit: +2 for physical assistance, Mod assist Sit to supine: +2 for  physical assistance, Mod assist   General bed mobility comments: +2 Mod A for RLE and trunk management with cues for sequencing    Transfers Overall transfer level: Needs assistance Equipment used: Right platform walker Transfers: Sit to/from Stand Sit to Stand: From elevated surface, Min assist, +2 physical assistance           General transfer comment: Pt able to perform STS x 2 with +2 min A and verbal/tactile cues for sequencing for RUE WB compliance and to encourage use of RLE    Ambulation/Gait               General Gait Details: Pt able to advance RLE with physical assistance but unable to advance LLE   Stairs             Wheelchair Mobility     Tilt Bed    Modified Rankin (Stroke Patients Only)       Balance Overall balance assessment: Needs assistance Sitting-balance support: Feet supported, Single extremity supported Sitting balance-Leahy Scale: Fair     Standing balance support: Bilateral upper extremity supported Standing balance-Leahy Scale: Fair Standing balance comment: standing at platform walker                            Cognition Arousal: Alert Behavior During Therapy: WFL for tasks assessed/performed Overall Cognitive Status: Within Functional Limits for tasks assessed  Exercises Total Joint Exercises Ankle Circles/Pumps: AROM, Strengthening, 5 reps Quad Sets: Strengthening, Both, 5 reps Gluteal Sets: Strengthening, Both, 5 reps Hip ABduction/ADduction: AAROM, Strengthening, Right, 5 reps Straight Leg Raises: AAROM, Strengthening, Right, 5 reps Other Exercises Other Exercises: Static standing at EOB for improved activity tolerance 1 x 3 min, 1 x 60 sec    General Comments        Pertinent Vitals/Pain Pain Assessment Pain Assessment: 0-10 Pain Score: 2  Pain Location: RLE, RUE Pain Descriptors / Indicators: Aching, Sore Pain Intervention(s):  Repositioned, Monitored during session, Patient requesting pain meds-RN notified    Home Living                          Prior Function            PT Goals (current goals can now be found in the care plan section) Progress towards PT goals: Not progressing toward goals - comment (limited by weakness and dizziness)    Frequency    BID      PT Plan      Co-evaluation              AM-PAC PT "6 Clicks" Mobility   Outcome Measure  Help needed turning from your back to your side while in a flat bed without using bedrails?: A Lot Help needed moving from lying on your back to sitting on the side of a flat bed without using bedrails?: A Lot Help needed moving to and from a bed to a chair (including a wheelchair)?: A Lot Help needed standing up from a chair using your arms (e.g., wheelchair or bedside chair)?: A Little Help needed to walk in hospital room?: Total Help needed climbing 3-5 steps with a railing? : Total 6 Click Score: 11    End of Session Equipment Utilized During Treatment: Gait belt Activity Tolerance: Patient limited by pain;Other (comment) (limited by dizziness in standing) Patient left: in bed;with bed alarm set;with call bell/phone within reach;with nursing/sitter in room;with SCD's reapplied Nurse Communication: Mobility status PT Visit Diagnosis: Other abnormalities of gait and mobility (R26.89);Pain;Muscle weakness (generalized) (M62.81) Pain - Right/Left: Right Pain - part of body: Leg;Hip;Arm     Time: 0981-1914 PT Time Calculation (min) (ACUTE ONLY): 36 min  Charges:    $Therapeutic Exercise: 8-22 mins $Therapeutic Activity: 8-22 mins PT General Charges $$ ACUTE PT VISIT: 1 Visit                    D. Scott Jessamy Torosyan PT, DPT 09/17/22, 10:30 AM

## 2022-09-17 NOTE — TOC Progression Note (Signed)
Transition of Care [facility]) - Progression Note    Patient Details  Name: Alejandra Wiley MRN: [id number] Date of Birth: 02/13/1932  [facility]) CM/SW Contact  Marlowe Sax, RN Phone Number: 09/17/2022, 10:53 AM  Clinical Narrative:     Met with the patient and husband in the room, They plan for her to go to [facility] to [employer], she lives with her husband at [facility] in the Independent living area.  Sue Lush at [facility] is aware of the plan to go to STR and agrees, nFl2 completed, PASSR number obtained PASSR in the name Constance Goltz, EMS will be required to transport to STR  Expected Discharge Plan: Skilled Nursing Facility    Expected Discharge Plan and Services                                               Social Determinants of Health (SDOH) Interventions SDOH Screenings   Food Insecurity: No Food Insecurity (09/14/2022)  Housing: Low Risk  (09/14/2022)  Transportation Needs: No Transportation Needs (09/14/2022)  Utilities: Not At Risk (09/14/2022)  Financial Resource Strain: Low Risk  (05/18/2022)   Received from [facility], [facility]  Tobacco Use: Low Risk  (09/15/2022)    Readmission Risk Interventions     No data to display

## 2022-09-17 NOTE — Anesthesia Postprocedure Evaluation (Signed)
Anesthesia Post Note  Patient: Alejandra Wiley  Procedure(s) Performed: OPEN REDUCTION INTERNAL FIXATION (ORIF) DISTAL RADIUS FRACTURE (Right)  Patient location during evaluation: PACU Anesthesia Type: General Level of consciousness: awake and alert Pain management: pain level controlled Vital Signs Assessment: post-procedure vital signs reviewed and stable Respiratory status: spontaneous breathing, nonlabored ventilation, respiratory function stable and patient connected to nasal cannula oxygen Cardiovascular status: blood pressure returned to baseline and stable Postop Assessment: no apparent nausea or vomiting Anesthetic complications: no   No notable events documented.   Last Vitals:  Vitals:   09/16/22 1746 09/16/22 2258  BP: 123/75 117/60  Pulse: 73 71  Resp: 18 18  Temp: 36.5 C 36.8 C  SpO2: 93% 95%    Last Pain:  Vitals:   09/16/22 2154  TempSrc:   PainSc: 2                  Yevette Edwards

## 2022-09-17 NOTE — Evaluation (Signed)
Occupational Therapy Evaluation Patient Details Name: Alejandra Wiley MRN: [id number] DOB: [birth year] Today's Date: 09/17/2022   History of Present Illness Patient is a 89 y.o. female with medical history significant for anxiety, hypertension, CHF, aortic atherosclerosis, CVA, paroxysmal atrial fibrillation on Eliquis, R TKA, R reverse shoulder, and anxiety, who presented to the emergency room with acute onset of accidental mechanical fall. Pt is now disgnosed with Closed R hip fx, is also s/p ORIF of right distal radius fx.   Clinical Impression   Patient presenting with decreased Ind in self care,balance, functional mobility/transfers, endurance, and safety awareness. Patient reports being Ind at baseline and living with [facility] at [facility]. Pt with tangential speech throughout and needing to be redirected to task. She was able to correctly verbalized precautions for R LE and R UE. OT discussed sitting EOB and pt reports not wanting to do that because of purewick. Pt also has not ate lunch yet. Pt ultimately placed in trendelenburg and utilized L UE and L LE to pull and push self up to [facility] with increased effort and min A from therapist. Pt placed into chair position and needing set up A to obtain all items on tray table. Pt needing max- total A for all self care tasks from bed level and +2 assistance for bed mobility/standing attempts at this time.  Patient will benefit from acute OT to increase overall independence in the areas of ADLs, functional mobility, and safety awareness in order to safely discharge.      If plan is discharge home, recommend the following: Two people to help with walking and/or transfers;Two people to help with bathing/dressing/bathroom;Assistance with cooking/housework;Assist for transportation;Help with stairs or ramp for entrance    Functional Status Assessment  Patient has had a recent decline in their functional status and  demonstrates the ability to make significant improvements in function in a reasonable and predictable amount of time.  Equipment Recommendations  Other (comment) (defer to next venue of care)       Precautions / Restrictions Precautions Precautions: Fall Restrictions Weight Bearing Restrictions: Yes RUE Weight Bearing: Non weight bearing RLE Weight Bearing: Partial weight bearing RLE Partial Weight Bearing Percentage or Pounds: 50%             ADL either performed or assessed with clinical judgement   ADL Overall ADL's : Needs assistance/impaired                                       General ADL Comments: Pt needing assistance to set up lunch tray. OT anticipates Max- total A for UB and LB self care.     Vision Patient Visual Report: No change from baseline              Pertinent Vitals/Pain Pain Assessment Pain Assessment: 0-10 Faces Pain Scale: Hurts little more Pain Location: RLE, RUE Pain Descriptors / Indicators: Sore Pain Intervention(s): Premedicated before session, Monitored during session     Extremity/Trunk Assessment Upper Extremity Assessment Upper Extremity Assessment: Generalized weakness;Right hand dominant RUE Deficits / Details: NWB, s/p ORIF           Communication Communication Communication: No apparent difficulties Cueing Techniques: Verbal cues;Tactile cues   Cognition Arousal: Alert Behavior During Therapy: WFL for tasks assessed/performed Overall Cognitive Status: Within Functional Limits for tasks assessed  General Comments: Tangential speech and needing redirection but pleasant overall                Home Living Family/patient expects to be discharged to:: Private residence Living Arrangements: Spouse/significant other Available Help at Discharge: Family;Available PRN/intermittently Type of Home: Independent living facility Home Access: Level entry     Home  Layout: One level     Bathroom Shower/Tub: Producer, television/film/video: Handicapped height Bathroom Accessibility: Yes   Home Equipment: Cane - quad;Cane - single point;Grab bars - tub/shower;Grab bars - toilet          Prior Functioning/Environment Prior Level of Function : Independent/Modified Independent             Mobility Comments: Normally Ind, walking with no AD household/facility distances ADLs Comments: Ind        OT Problem List: Decreased strength;Decreased activity tolerance;Decreased safety awareness;Impaired balance (sitting and/or standing);Decreased knowledge of use of DME or AE      OT Treatment/Interventions: Therapeutic exercise;Self-care/ADL training;Therapeutic activities;Energy conservation;DME and/or AE instruction;Patient/family education;Balance training    OT Goals(Current goals can be found in the care plan section) Acute Rehab OT Goals Patient Stated Goal: to go to rehab OT Goal Formulation: With patient Time For Goal Achievement: 10/01/22 Potential to Achieve Goals: Fair ADL Goals Pt Will Perform Grooming: with supervision;with set-up;sitting Pt Will Perform Lower Body Dressing: with mod assist;sitting/lateral leans Pt Will Transfer to Toilet: with mod assist;bedside commode;squat pivot transfer Pt Will Perform Toileting - Clothing Manipulation and hygiene: with mod assist;sitting/lateral leans  OT Frequency: Min 1X/week       AM-PAC OT "6 Clicks" Daily Activity     Outcome Measure Help from another person eating meals?: None Help from another person taking care of personal grooming?: A Little Help from another person toileting, which includes using toliet, bedpan, or urinal?: Total Help from another person bathing (including washing, rinsing, drying)?: A Lot Help from another person to put on and taking off regular upper body clothing?: A Lot Help from another person to put on and taking off regular lower body clothing?: Total 6  Click Score: 13   End of Session Nurse Communication: Mobility status  Activity Tolerance: Patient tolerated treatment well Patient left: in bed;with call bell/phone within reach;with bed alarm set  OT Visit Diagnosis: Unsteadiness on feet (R26.81);Repeated falls (R29.6);Muscle weakness (generalized) (M62.81)                Time: 0102-7253 OT Time Calculation (min): 25 min Charges:  OT General Charges $OT Visit: 1 Visit OT Evaluation $OT Eval Moderate Complexity: 1 Mod OT Treatments $Self Care/Home Management : 8-22 mins  Jackquline Denmark, MS, OTR/L , CBIS ascom [phone]  09/17/22, 4:02 PM

## 2022-09-17 NOTE — Progress Notes (Addendum)
Progress Note    Alejandra Wiley  WUJ:[id number] DOB: 1932/09/02  DOA: 09/13/2022 PCP: Lauro Regulus, MD      Brief Narrative:    Medical records reviewed and are as summarized below:  Alejandra Wiley is a 89 y.o. female with medical history significant for anxiety, hypertension, CHF, aortic atherosclerosis, CVA, paroxysmal atrial fibrillation on Eliquis anxiety, who presented to the hospital because of right hip pain after a mechanical fall  She was found to have close right hip fracture, right wrist fracture involving the right radius on the right.    Assessment/Plan:   Principal Problem:   Closed right hip fracture [facility]) Active Problems:   Right wrist fracture   Paroxysmal atrial fibrillation (HCC)   Fall at home, initial encounter   Essential hypertension   Depression   Closed fracture of right radius and ulna   Malnutrition of moderate degree   Nutrition Problem: Moderate Malnutrition Etiology: social / environmental circumstances  Signs/Symptoms: moderate fat depletion, moderate muscle depletion   Body mass index is 20.96 kg/m.    S/p mechanical fall at home, right distal radius fracture, right distal ulna fracture, closed right hip fracture: S/p ORIF right distal radius fracture on 09/15/2022.  Analgesics as needed for pain.  Continue PT and OT as able. Conservative management was recommended for right hip fracture.   Paroxysmal atrial fibrillation: Continue Eliquis   Acute on chronic anemia: Probably from hemodilution.  H&H is stable.   Thrombocytopenia: Probably related to surgery.  Platelet count is trending up.   Other comorbidities include depression, hypertension    Diet Order             Diet regular Room service appropriate? Yes; Fluid consistency: Thin  Diet effective now                            Consultants: Orthopedic surgeon  Procedures: S/p ORIF right radius fracture on  09/15/2022    Medications:    amiodarone  200 mg Oral Daily   amLODipine  2.5 mg Oral Daily   apixaban  2.5 mg Oral BID   docusate sodium  100 mg Oral BID   feeding supplement  237 mL Oral BID BM   multivitamin with minerals  1 tablet Oral Daily   propranolol ER  80 mg Oral Daily   Continuous Infusions:     Anti-infectives (From admission, onward)    Start     Dose/Rate Route Frequency Ordered Stop   09/15/22 0719  ceFAZolin (ANCEF) IVPB 2g/100 mL premix        2 g 200 mL/hr over 30 Minutes Intravenous 30 min pre-op 09/15/22 0719 09/15/22 1536              Family Communication/Anticipated D/C date and plan/Code Status   DVT prophylaxis: apixaban (ELIQUIS) tablet 2.5 mg Start: 09/16/22 1000 SCDs Start: 09/15/22 1954 Place TED hose Start: 09/15/22 1954 apixaban (ELIQUIS) tablet 2.5 mg     Code Status: DNR  Family Communication: None Disposition Plan: Plan to discharge to SNF   Status is: Inpatient Remains inpatient appropriate because: Awaiting placement to SNF       Subjective:   Interval events noted.  Pain in right hand and right hip is improving.  She is apprehensive about being discharged and does not want to be discharged today.  Objective:    Vitals:   09/16/22 2258 09/17/22 0500 09/17/22 0848 09/17/22 1516  BP: 117/60  (!) 155/68 120/61  Pulse: 71  76 78  Resp: 18  14 14   Temp: 98.2 F (36.8 C)  (!) 97.5 F (36.4 C) 98.3 F (36.8 C)  TempSrc:      SpO2: 95%  94% 96%  Weight:  58.9 kg    Height:       No data found.   Intake/Output Summary (Last 24 hours) at 09/17/2022 1722 Last data filed at 09/17/2022 0500 Gross per 24 hour  Intake --  Output 900 ml  Net -900 ml   Filed Weights   09/14/22 1516 09/16/22 0500 09/17/22 0500  Weight: 57.9 kg 59.7 kg 58.9 kg    Exam:   GEN: No acute distress SKIN: Warm and dry EYES: No pallor or icterus ENT: MMM CV: RRR PULM: CTA B ABD: soft, ND, NT, +BS CNS: AAO x 3, non focal EXT:  Right hip tenderness.  Dressing on right forearm/arm is clean, dry and intact      Data Reviewed:   I have personally reviewed following labs and imaging studies:  Labs: Labs show the following:   Basic Metabolic Panel: Recent Labs  Lab 09/13/22 2006 09/14/22 0349 09/16/22 0422  NA 134* 136 134*  K 4.0 4.8 4.3  CL 98 102 107  CO2 25 25 24   GLUCOSE 133* 154* 165*  BUN 32* 35* 16  CREATININE 1.02* 0.94 0.65  CALCIUM 8.9 8.4* 7.4*   GFR Estimated Creatinine Clearance: 46.1 mL/min (by C-G formula based on SCr of 0.65 mg/dL). Liver Function Tests: No results for input(s): "AST", "ALT", "ALKPHOS", "BILITOT", "PROT", "ALBUMIN" in the last 168 hours. No results for input(s): "LIPASE", "AMYLASE" in the last 168 hours. No results for input(s): "AMMONIA" in the last 168 hours. Coagulation profile Recent Labs  Lab 09/13/22 2006  INR 1.2    CBC: Recent Labs  Lab 09/13/22 2006 09/14/22 0349 09/16/22 0422 09/17/22 0615  WBC 10.0 12.5* 8.2 12.5*  NEUTROABS  --   --  7.1  --   HGB 11.9* 9.9* 8.5* 9.0*  HCT 36.0 29.9* 25.6* 27.2*  MCV 95.2 94.9 95.2 96.8  PLT 160 137* 90* 126*   Cardiac Enzymes: No results for input(s): "CKTOTAL", "CKMB", "CKMBINDEX", "TROPONINI" in the last 168 hours. BNP (last 3 results) No results for input(s): "PROBNP" in the last 8760 hours. CBG: No results for input(s): "GLUCAP" in the last 168 hours. D-Dimer: No results for input(s): "DDIMER" in the last 72 hours. Hgb A1c: No results for input(s): "HGBA1C" in the last 72 hours. Lipid Profile: No results for input(s): "CHOL", "HDL", "LDLCALC", "TRIG", "CHOLHDL", "LDLDIRECT" in the last 72 hours. Thyroid function studies: No results for input(s): "TSH", "T4TOTAL", "T3FREE", "THYROIDAB" in the last 72 hours.  Invalid input(s): "FREET3" Anemia work up: No results for input(s): "VITAMINB12", "FOLATE", "FERRITIN", "TIBC", "IRON", "RETICCTPCT" in the last 72 hours. Sepsis Labs: Recent Labs   Lab 09/13/22 2006 09/14/22 0349 09/16/22 0422 09/17/22 0615  WBC 10.0 12.5* 8.2 12.5*    Microbiology No results found for this or any previous visit (from the past 240 hour(s)).  Procedures and diagnostic studies:  No results found.             LOS: 4 days   Maurice Fotheringham  [facility]   Pager on [email or web address]. If 7PM-7AM, please contact night-coverage at [email or web address]     09/17/2022, 5:22 PM

## 2022-09-17 NOTE — Plan of Care (Signed)

## 2022-09-18 DIAGNOSIS — S62101A Fracture of unspecified carpal bone, right wrist, initial encounter for closed fracture: Secondary | ICD-10-CM | POA: Diagnosis not present

## 2022-09-18 LAB — BASIC METABOLIC PANEL
Anion gap: 6 (ref 5–15)
BUN: 21 mg/dL (ref 8–23)
CO2: 26 mmol/L (ref 22–32)
Calcium: 7.9 mg/dL — ABNORMAL LOW (ref 8.9–10.3)
Chloride: 102 mmol/L (ref 98–111)
Creatinine, Ser: 0.65 mg/dL (ref 0.44–1.00)
GFR, Estimated: >60 mL/min (ref >60)
Glucose, Bld: 98 mg/dL (ref 70–99)
Potassium: 4.2 mmol/L (ref 3.5–5.1)
Sodium: 134 mmol/L — ABNORMAL LOW (ref 135–145)

## 2022-09-18 LAB — CBC WITH DIFFERENTIAL/PLATELET
Abs Immature Granulocytes: 0.25 10*3/uL — ABNORMAL HIGH (ref 0.00–0.07)
Basophils Absolute: 0 10*3/uL (ref 0.0–0.1)
Basophils Relative: 0 %
Eosinophils Absolute: 0.6 10*3/uL — ABNORMAL HIGH (ref 0.0–0.5)
Eosinophils Relative: 6 %
HCT: 27.2 % — ABNORMAL LOW (ref 36.0–46.0)
Hemoglobin: 9.1 g/dL — ABNORMAL LOW (ref 12.0–15.0)
Immature Granulocytes: 3 %
Lymphocytes Relative: 18 %
Lymphs Abs: 1.6 10*3/uL (ref 0.7–4.0)
MCH: 31.7 pg (ref 26.0–34.0)
MCHC: 33.5 g/dL (ref 30.0–36.0)
MCV: 94.8 fL (ref 80.0–100.0)
Monocytes Absolute: 1.1 10*3/uL — ABNORMAL HIGH (ref 0.1–1.0)
Monocytes Relative: 12 %
Neutro Abs: 5.6 10*3/uL (ref 1.7–7.7)
Neutrophils Relative %: 61 %
Platelets: 134 10*3/uL — ABNORMAL LOW (ref 150–400)
RBC: 2.87 MIL/uL — ABNORMAL LOW (ref 3.87–5.11)
RDW: 15 % (ref 11.5–15.5)
WBC: 9.1 10*3/uL (ref 4.0–10.5)
nRBC: 0.5 % — ABNORMAL HIGH (ref 0.0–0.2)

## 2022-09-18 MED ORDER — SORBITOL 70 % SOLN
960.0000 mL | TOPICAL_OIL | Freq: Once | ORAL | Status: AC
  Administered 2022-09-18: 960 mL via RECTAL
  Filled 2022-09-18: qty 240

## 2022-09-18 NOTE — Progress Notes (Signed)
Progress Note    Alejandra Wiley  NGE:[id number] DOB: 1932/11/26  DOA: 09/13/2022 PCP: Lauro Regulus, MD      Brief Narrative:    Medical records reviewed and are as summarized below:  Alejandra Wiley is a 89 y.o. female with medical history significant for anxiety, hypertension, CHF, aortic atherosclerosis, CVA, paroxysmal atrial fibrillation on Eliquis anxiety, who presented to the hospital because of right hip pain after a mechanical fall  She was found to have close right hip fracture, right wrist fracture involving the right radius on the right.    Assessment/Plan:   Principal Problem:   Closed right hip fracture [facility]) Active Problems:   Right wrist fracture   Paroxysmal atrial fibrillation (HCC)   Fall at home, initial encounter   Essential hypertension   Depression   Closed fracture of right radius and ulna   Malnutrition of moderate degree   Nutrition Problem: Moderate Malnutrition Etiology: social / environmental circumstances  Signs/Symptoms: moderate fat depletion, moderate muscle depletion   Body mass index is 20.99 kg/m.    S/p mechanical fall at home, right distal radius fracture, right distal ulna fracture, closed right hip fracture: S/p ORIF right distal radius fracture on 09/15/2022.  Analgesics as needed for pain.  Continue PT and OT as able. Conservative management was recommended for right hip fracture.   Paroxysmal atrial fibrillation: Continue Eliquis   Acute on chronic anemia: Probably from hemodilution.  H&H is stable.   Thrombocytopenia: Probably related to surgery.  Platelet count is trending up.   Constipation and abdominal discomfort: Smog enema has been prescribed.   Other comorbidities include depression, hypertension   She can probably be discharged to SNF once she moves her bowels.  Diet Order             Diet regular Room service appropriate? Yes; Fluid consistency: Thin  Diet effective now                             Consultants: Orthopedic surgeon  Procedures: S/p ORIF right radius fracture on 09/15/2022    Medications:    amiodarone  200 mg Oral Daily   amLODipine  2.5 mg Oral Daily   apixaban  2.5 mg Oral BID   docusate sodium  100 mg Oral BID   feeding supplement  237 mL Oral BID BM   multivitamin with minerals  1 tablet Oral Daily   propranolol ER  80 mg Oral Daily   sorbitol, milk of mag, mineral oil, glycerin (SMOG) enema  960 mL Rectal Once   Continuous Infusions:     Anti-infectives (From admission, onward)    Start     Dose/Rate Route Frequency Ordered Stop   09/15/22 0719  ceFAZolin (ANCEF) IVPB 2g/100 mL premix        2 g 200 mL/hr over 30 Minutes Intravenous 30 min pre-op 09/15/22 0719 09/15/22 1536              Family Communication/Anticipated D/C date and plan/Code Status   DVT prophylaxis: apixaban (ELIQUIS) tablet 2.5 mg Start: 09/16/22 1000 SCDs Start: 09/15/22 1954 Place TED hose Start: 09/15/22 1954 apixaban (ELIQUIS) tablet 2.5 mg     Code Status: DNR  Family Communication: None Disposition Plan: Plan to discharge to SNF   Status is: Inpatient Remains inpatient appropriate because: Awaiting placement to SNF       Subjective:   Interval events noted.  She  complains of abdominal discomfort and constipation.  No bowel movement since Wednesday.  No abdominal pain or vomiting.  Objective:    Vitals:   09/17/22 1516 09/17/22 2339 09/18/22 0500 09/18/22 0814  BP: 120/61 (!) 160/88  (!) 123/97  Pulse: 78 80  71  Resp: 14 20  14   Temp: 98.3 F (36.8 C) 98 F (36.7 C)  97.7 F (36.5 C)  TempSrc:      SpO2: 96% 95%  94%  Weight:   59 kg   Height:       No data found.   Intake/Output Summary (Last 24 hours) at 09/18/2022 1150 Last data filed at 09/18/2022 0500 Gross per 24 hour  Intake 0 ml  Output 1200 ml  Net -1200 ml   Filed Weights   09/16/22 0500 09/17/22 0500 09/18/22 0500  Weight: 59.7  kg 58.9 kg 59 kg    Exam:   GEN: NAD SKIN: Warm and dry EYES: No pallor or icterus ENT: MMM CV: RRR PULM: CTA B ABD: soft, ND, NT, +BS CNS: AAO x 3, non focal EXT: Dressing on right forearm is clean, dry and intact.  Mild right hip tenderness.     Data Reviewed:   I have personally reviewed following labs and imaging studies:  Labs: Labs show the following:   Basic Metabolic Panel: Recent Labs  Lab 09/13/22 2006 09/14/22 0349 09/16/22 0422 09/18/22 0509  NA 134* 136 134* 134*  K 4.0 4.8 4.3 4.2  CL 98 102 107 102  CO2 25 25 24 26   GLUCOSE 133* 154* 165* 98  BUN 32* 35* 16 21  CREATININE 1.02* 0.94 0.65 0.65  CALCIUM 8.9 8.4* 7.4* 7.9*   GFR Estimated Creatinine Clearance: 46.1 mL/min (by C-G formula based on SCr of 0.65 mg/dL). Liver Function Tests: No results for input(s): "AST", "ALT", "ALKPHOS", "BILITOT", "PROT", "ALBUMIN" in the last 168 hours. No results for input(s): "LIPASE", "AMYLASE" in the last 168 hours. No results for input(s): "AMMONIA" in the last 168 hours. Coagulation profile Recent Labs  Lab 09/13/22 2006  INR 1.2    CBC: Recent Labs  Lab 09/13/22 2006 09/14/22 0349 09/16/22 0422 09/17/22 0615 09/18/22 0509  WBC 10.0 12.5* 8.2 12.5* 9.1  NEUTROABS  --   --  7.1  --  5.6  HGB 11.9* 9.9* 8.5* 9.0* 9.1*  HCT 36.0 29.9* 25.6* 27.2* 27.2*  MCV 95.2 94.9 95.2 96.8 94.8  PLT 160 137* 90* 126* 134*   Cardiac Enzymes: No results for input(s): "CKTOTAL", "CKMB", "CKMBINDEX", "TROPONINI" in the last 168 hours. BNP (last 3 results) No results for input(s): "PROBNP" in the last 8760 hours. CBG: No results for input(s): "GLUCAP" in the last 168 hours. D-Dimer: No results for input(s): "DDIMER" in the last 72 hours. Hgb A1c: No results for input(s): "HGBA1C" in the last 72 hours. Lipid Profile: No results for input(s): "CHOL", "HDL", "LDLCALC", "TRIG", "CHOLHDL", "LDLDIRECT" in the last 72 hours. Thyroid function studies: No results  for input(s): "TSH", "T4TOTAL", "T3FREE", "THYROIDAB" in the last 72 hours.  Invalid input(s): "FREET3" Anemia work up: No results for input(s): "VITAMINB12", "FOLATE", "FERRITIN", "TIBC", "IRON", "RETICCTPCT" in the last 72 hours. Sepsis Labs: Recent Labs  Lab 09/14/22 0349 09/16/22 0422 09/17/22 0615 09/18/22 0509  WBC 12.5* 8.2 12.5* 9.1    Microbiology No results found for this or any previous visit (from the past 240 hour(s)).  Procedures and diagnostic studies:  No results found.  LOS: 5 days   Gelsey Amyx  [facility]   Pager on [email or web address]. If 7PM-7AM, please contact night-coverage at [email or web address]     09/18/2022, 11:50 AM

## 2022-09-18 NOTE — Progress Notes (Addendum)
Subjective: 3 Days Post-Op Procedure(s) (LRB): OPEN REDUCTION INTERNAL FIXATION (ORIF) DISTAL RADIUS FRACTURE (Right) Nondisplaced periprosthetic greater trochanteric right hip fracture. Patient reports pain mild right wrist pain and moderate right hip pain.  Suppository given this morning, patient had to roll over and she describes some increased right hip pain. Patient complains of mild constipation.  Suppository given this morning.  Has milk of mag and Fleet enema as well as milk of mag ordered.  No abdominal pain nausea or vomiting.  She is tolerating p.o. well. Plan is for patient to discharge to skilled nursing facility, [facility] where she resides.  Objective: Vital signs in last 24 hours: Temp:  [97.5 F (36.4 C)-98.3 F (36.8 C)] 97.7 F (36.5 C) (08/31 0814) Pulse Rate:  [71-80] 71 (08/31 0814) Resp:  [14-20] 14 (08/31 0814) BP: (120-160)/(61-97) 123/97 (08/31 0814) SpO2:  [94 %-96 %] 94 % (08/31 0814) Weight:  [59 kg] 59 kg (08/31 0500)  Intake/Output from previous day:  Intake/Output Summary (Last 24 hours) at 09/18/2022 0843 Last data filed at 09/18/2022 0500 Gross per 24 hour  Intake 0 ml  Output 1200 ml  Net -1200 ml    Intake/Output this shift: No intake/output data recorded.  Labs: Recent Labs    09/16/22 0422 09/17/22 0615 09/18/22 0509  HGB 8.5* 9.0* 9.1*   Recent Labs    09/17/22 0615 09/18/22 0509  WBC 12.5* 9.1  RBC 2.81* 2.87*  HCT 27.2* 27.2*  PLT 126* 134*   Recent Labs    09/16/22 0422 09/18/22 0509  NA 134* 134*  K 4.3 4.2  CL 107 102  CO2 24 26  BUN 16 21  CREATININE 0.65 0.65  GLUCOSE 165* 98  CALCIUM 7.4* 7.9*   No results for input(s): "LABPT", "INR" in the last 72 hours.   EXAM General - Patient is Alert, Appropriate, and Oriented Right lower extremity - Neurovascular intact Dorsiflexion/Plantar flexion intact No cellulitis present Compartment soft Right upper extremity-volar splint with Ace wrap intact to the  right arm.  Minimal swelling and ecchymosis.  Full composite fist present.  Sensation intact throughout Motor Function - intact, moving foot and toes well on exam as well as fingers. 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 Rosita Kea during a back surgery in approx 2019.   Hypertension    Long term current use of anticoagulant    a.) dose reduced apixaban   MRSA nasal colonization 09/17/2016   Pacemaker 12/06/2003   [employer] Impulse [id number]   Pacemaker 2015   PAF (paroxysmal atrial fibrillation) (HCC)    a.) CHA2DS2-VASc = 8 (age x 2, sex, CHF, HTN, CVA x 2, aortic plaque). b.) rate/rhythm maintained on oral amiodarone + metoprolol succinate; chronically anticoagulated with dose reduced apixaban   Sick sinus syndrome (HCC)    Sinoatrial node dysfunction (HCC)    Stroke (HCC)    Vaginal intraepithelial neoplasia    VAIN (vaginal intraepithelial neoplasia)     Assessment/Plan: 3 Days Post-Op Procedure(s) (LRB): OPEN REDUCTION INTERNAL FIXATION (ORIF) DISTAL RADIUS FRACTURE (Right) Principal Problem:   Closed right hip fracture [facility]) Active Problems:   Fall  at home, initial encounter   Right wrist fracture   Essential hypertension   Depression   Paroxysmal atrial fibrillation (HCC)   Closed fracture of right radius and ulna   Malnutrition of moderate degree  Estimated body mass index is 20.99 kg/m as calculated from the following:   Height as of this encounter: 5\' 6"  (1.676 m).   Weight as of this encounter: 59 kg. Up with therapy, nonweightbearing right upper extremity, partial weightbearing right lower extremity  Right wrist and  right hip pain well-controlled.  Labs and vital signs are stable.  Continue to work on bowel movement.  Management to assist with discharge to [facility]  Following discharge, continue with chronic eliquis. Follow-up with [facility] in 10-14 days for splint removal and x-rays of the right hip and wrist.  DVT Prophylaxis - TED hose and Eliquis Non-weightbearing to the right arm. Partial WB to the right leg.   Lollie Marrow, PA-C [facility] Orthopaedic Surgery 09/18/2022, 8:43 AM

## 2022-09-18 NOTE — Plan of Care (Signed)
  Problem: Clinical Measurements: Goal: Will remain free from infection Outcome: Progressing   Problem: Activity: Goal: Risk for activity intolerance will decrease Outcome: Progressing   Problem: Elimination: Goal: Will not experience complications related to bowel motility Outcome: Progressing   Problem: Pain Managment: Goal: General experience of comfort will improve Outcome: Progressing   Problem: Safety: Goal: Ability to remain free from injury will improve Outcome: Progressing   Problem: Skin Integrity: Goal: Risk for impaired skin integrity will decrease Outcome: Progressing   

## 2022-09-18 NOTE — Progress Notes (Signed)
PT Cancellation Note  Patient Details Name: Alejandra Wiley MRN: [id number] DOB: [birth year]   Cancelled Treatment:     Therapist returned in pm, pt declined stating she was awaiting EMS arrival to transport to [facility]. Discussed benefits of mobility and addressed all questions/concerns with good understanding.    [name] 09/18/2022, 5:05 PM

## 2022-09-18 NOTE — Progress Notes (Signed)
PT Cancellation Note  Patient Details Name: Alejandra Wiley MRN: [id number] DOB: 10/12/1932   Cancelled Treatment:     Therapist in to see pt just now. Pt with emesis bag and c/o significant nausea declining any activity. Nursing notified. Will re-attempt at a later time today.   [name] 09/18/2022, 9:43 AM

## 2022-09-18 NOTE — TOC Progression Note (Signed)
Transition of Care [facility]) - Progression Note    Patient Details  Name: Alejandra Wiley MRN: [id number] Date of Birth: 12-27-1932  Transition of Care [facility]) CM/SW Contact  Colette Ribas, [place] Phone Number: 09/18/2022, 4:29 PM  Clinical Narrative:    CSW spoke with EMS & [name] scheduled for pick up room 111   Expected Discharge Plan: Skilled Nursing Facility    Expected Discharge Plan and Services         Expected Discharge Date: 09/18/22                                     Social Determinants of Health (SDOH) Interventions SDOH Screenings   Food Insecurity: No Food Insecurity (09/14/2022)  Housing: Low Risk  (09/14/2022)  Transportation Needs: No Transportation Needs (09/14/2022)  Utilities: Not At Risk (09/14/2022)  Financial Resource Strain: Low Risk  (05/18/2022)   Received from [facility], [facility]  Tobacco Use: Low Risk  (09/15/2022)    Readmission Risk Interventions     No data to display

## 2022-09-18 NOTE — TOC Progression Note (Addendum)
Transition of Care [facility]) - Progression Note    Patient Details  Name: Alejandra Wiley MRN: [id number] Date of Birth: 08-24-1932  Transition of Care [facility]) CM/SW Contact  Liliana Cline, LCSW Phone Number: 09/18/2022, 11:03 AM  Clinical Narrative:    Spoke to Sue Lush with [facility] who inquires if patient is medically ready for DC. Asked Ortho and Hospitalist.   1:57- Awaiting bowel movement to DC to SNF. Notified Sue Lush at [facility]. EMS paperwork completed in case patient does go today.    Expected Discharge Plan: Skilled Nursing Facility    Expected Discharge Plan and Services                                               Social Determinants of Health (SDOH) Interventions SDOH Screenings   Food Insecurity: No Food Insecurity (09/14/2022)  Housing: Low Risk  (09/14/2022)  Transportation Needs: No Transportation Needs (09/14/2022)  Utilities: Not At Risk (09/14/2022)  Financial Resource Strain: Low Risk  (05/18/2022)   Received from [facility], [facility]  Tobacco Use: Low Risk  (09/15/2022)    Readmission Risk Interventions     No data to display

## 2022-09-18 NOTE — Discharge Summary (Addendum)
Physician Discharge Summary   Patient: Alejandra Wiley MRN: [id number] DOB: 10/29/1932  Admit date:     09/13/2022  Discharge date: 09/18/22  Discharge Physician: Lurene Shadow   PCP: Lauro Regulus, MD   Recommendations at discharge:   Follow-up with physician at the nursing home within 3 days of discharge Follow-up with orthopedic surgeon in 10 to 14 days.  Discharge Diagnoses: Principal Problem:   Closed right hip fracture [facility]) Active Problems:   Right wrist fracture   Paroxysmal atrial fibrillation (HCC)   Fall at home, initial encounter   Essential hypertension   Depression   Closed fracture of right radius and ulna   Malnutrition of moderate degree  Resolved Problems:   * No resolved hospital problems. *  Hospital Course:  Alejandra Wiley is a 89 y.o. female with medical history significant for anxiety, hypertension, CHF, aortic atherosclerosis, CVA, paroxysmal atrial fibrillation on Eliquis anxiety, who presented to the hospital because of right hip pain after a mechanical fall   She was found to have close right hip fracture, right wrist fracture involving the right radius on the right.    Assessment and Plan:  S/p mechanical fall at home, right distal radius fracture, right distal ulna fracture, closed right hip fracture: S/p ORIF right distal radius fracture on 09/15/2022.  Analgesics as needed for pain.   Conservative management was recommended for right hip fracture.     Paroxysmal atrial fibrillation: Continue Eliquis     Acute on chronic anemia: Probably from hemodilution.  H&H is stable.     Thrombocytopenia: Probably related to surgery.  Platelet count is trending up.     Constipation and abdominal discomfort: Laxatives as needed.     Other comorbidities include depression, hypertension    Her condition has improved and she is deemed stable for discharge to SNF today.       Consultants: Orthopedic surgeon Procedures performed:  S/p ORIF right radius fracture on 09/15/2022   Disposition: Skilled nursing facility Diet recommendation:  Discharge Diet Orders (From admission, onward)     Start     Ordered   09/18/22 0000  Diet - low sodium heart healthy        09/18/22 1406           Cardiac diet DISCHARGE MEDICATION: Allergies as of 09/18/2022       Reactions   Latex Other (See Comments)   blisters   Other Other (See Comments)   Blisters/ paper tape is Education administrator paper tape is Education administrator paper tape is Education administrator paper tape is Ok   Sport and exercise psychologist (See Comments)   Blisters/ paper tape is Education administrator paper tape is Ok   Xarelto [rivaroxaban] Other (See Comments)   Gums bleeding and too much bruising   Nickel Rash, Other (See Comments)        Medication List     TAKE these medications    acetaminophen 500 MG tablet Commonly known as: TYLENOL Take 500 mg by mouth every 8 (eight) hours as needed for mild pain or headache.   amiodarone 200 MG tablet Commonly known as: PACERONE Take 1 tablet by mouth 5 days a week   amLODipine 2.5 MG tablet Commonly known as: NORVASC TAKE 1 TABLET DAILY   buPROPion 150 MG 24 hr tablet Commonly known as: WELLBUTRIN XL Take 150 mg by mouth daily.   Eliquis 2.5 MG Tabs tablet Generic drug: apixaban TAKE 1 TABLET TWICE A DAY   HYDROcodone-acetaminophen 5-325 MG  tablet Commonly known as: NORCO/VICODIN Take 1-2 tablets by mouth every 6 (six) hours as needed for moderate pain.   ibandronate 150 MG tablet Commonly known as: BONIVA Take 150 mg by mouth every 30 (thirty) days. Take in the morning with a full glass of water, on an empty stomach, and do not take anything else by mouth or lie down for the next 30 min.   propranolol ER 80 MG 24 hr capsule Commonly known as: Inderal LA Take 1 capsule (80 mg total) by mouth daily.   SENIOR MULTIVITAMIN PLUS PO Take 1 tablet by mouth daily.        Follow-up Information     Anson [place], PA-C Follow  up in 14 day(s).   Specialty: Physician Assistant Why: Staple removal and x-rays. Contact information: [address] [place] [address] [phone]                Discharge Exam: Filed Weights   09/16/22 0500 09/17/22 0500 09/18/22 0500  Weight: 59.7 kg 58.9 kg 59 kg   GEN: NAD SKIN: Warm and dry EYES: No pallor or icterus ENT: MMM CV: RRR PULM: CTA B ABD: soft, ND, NT, +BS CNS: AAO x 3, non focal EXT: Dressing on right forearm is clean, dry and intact.  Mild right hip tenderness.      Condition at discharge: good  The results of significant diagnostics from this hospitalization (including imaging, microbiology, ancillary and laboratory) are listed below for reference.   Imaging Studies: DG Wrist 2 Views Right  Result Date: 09/14/2022 CLINICAL DATA:  Splint placement EXAM: RIGHT WRIST - 2 VIEW COMPARISON:  09/13/2022 FINDINGS: Improved alignment of radius and ulna fractures, with mild residual dorsal displacement at the radius fracture site. IMPRESSION: Improved alignment of radius and ulna fractures status post splint placement. Electronically Signed   By: Deatra Robinson M.D.   On: 09/14/2022 00:54   DG Hip Unilat W or Wo Pelvis 2-3 Views Right  Result Date: 09/13/2022 CLINICAL DATA:  Right hip pain, fell EXAM: DG HIP (WITH OR WITHOUT PELVIS) 2-3V RIGHT COMPARISON:  09/12/2017 FINDINGS: Frontal view of the pelvis as well as frontal and cross-table lateral views of the right hip are obtained. Right hip and knee arthroplasty is again identified, with a periprosthetic proximal right femur fracture identified along the greater trochanter. Soft tissue swelling overlies the right hip. Minimally displaced fractures are also seen through the right superior and inferior pubic rami. No other acute pelvic fractures. Stable left hip osteoarthritis. IMPRESSION: 1. Acute periprosthetic proximal right femur fracture, with fracture line extending through the greater  trochanter. 2. Acute minimally displaced right superior and inferior pubic rami fractures. 3. Left hip osteoarthritis. Electronically Signed   By: Sharlet Salina M.D.   On: 09/13/2022 21:18   DG Wrist Complete Right  Result Date: 09/13/2022 CLINICAL DATA:  Trauma to the right wrist. EXAM: RIGHT WRIST - COMPLETE 3+ VIEW COMPARISON:  None Available. FINDINGS: Displaced fractures of the distal radius and ulna with dorsal angulation and displacement of the distal fracture fragments. The distal radial fracture appears to extend into the articular surface of the wrist. There is also fracture of the ulnar styloid. No dislocation. There is soft tissue swelling of the wrist. No opaque foreign object/catheterization. IMPRESSION: Displaced fractures of the distal radius and ulna. Electronically Signed   By: Elgie Collard M.D.   On: 09/13/2022 21:17   CT Head Wo Contrast  Result Date: 09/10/2022 CLINICAL DATA:  Provided history:  Head trauma, minor. Fall (hitting left side of head on concrete). Nausea/vomiting. Unsteady gait. EXAM: CT HEAD WITHOUT CONTRAST CT CERVICAL SPINE WITHOUT CONTRAST TECHNIQUE: Multidetector CT imaging of the head and cervical spine was performed following the standard protocol without intravenous contrast. Multiplanar CT image reconstructions of the cervical spine were also generated. RADIATION DOSE REDUCTION: This exam was performed according to the departmental dose-optimization program which includes automated exposure control, adjustment of the mA and/or kV according to patient size and/or use of iterative reconstruction technique. COMPARISON:  Head CT 06/10/2021.  Cervical spine CT 03/27/2021. FINDINGS: CT HEAD FINDINGS Brain: Generalized cerebral atrophy. Patchy and ill-defined hypoattenuation within the cerebral white matter, nonspecific but compatible with mild chronic small vessel ischemic disease. There is no acute intracranial hemorrhage. No demarcated cortical infarct. No  extra-axial fluid collection. No evidence of an intracranial mass. No midline shift. Vascular: No hyperdense vessel.  Atherosclerotic calcifications. Skull: No calvarial fracture or aggressive osseous lesion. Sinuses/Orbits: No mass or acute finding within the imaged orbits. Postsurgical appearance of the paranasal sinuses. Minimal mucosal thickening within the ethmoid and left maxillary sinuses. Other: Small focus of increased density within the right forehead soft tissues, likely reflecting sequelae of prior hematoma/laceration at this site. Redemonstrated chronic nasal bone fracture deformity. CT CERVICAL SPINE FINDINGS Alignment: Slight C6-C7 and C7-T1 grade 1 anterolisthesis. Skull base and vertebrae: The basion-dental and atlanto-dental intervals are maintained.No evidence of acute fracture to the cervical spine. Facet ankylosis on the left at C2-C3. Soft tissues and spinal canal: Subcentimeter nodule within the left thyroid lobe not meeting consensus criteria for ultrasound follow-up based on size. No follow-up imaging is recommended. Reference: J Am Coll Radiol. 2015 Feb;12(2): 143-50. Disc levels: Cervical spondylosis with multilevel disc space narrowing, disc bulges/central disc protrusions, endplate spurring, uncovertebral hypertrophy and facet arthrosis. No appreciable high-grade spinal canal stenosis. Uncovertebral hypertrophy results in bony neural foraminal narrowing on the right at C5-C6. Degenerative changes also present at the C1-C2 articulation. Upper chest: No consolidation within the imaged lung apices. No visible pneumothorax. IMPRESSION: CT head: 1. No evidence of an acute intracranial abnormality. 2. Parenchymal atrophy and chronic small vessel ischemic disease. CT cervical spine: 1. No evidence of an acute cervical spine fracture. 2. Mild grade 1 anterolisthesis at C6-C7 and C7-T1. 3. Cervical spondylosis as described. 4. Facet ankylosis on the left at C2-C3. Electronically Signed   By: Jackey Loge D.O.   On: 09/10/2022 14:22   CT Cervical Spine Wo Contrast  Result Date: 09/10/2022 CLINICAL DATA:  Provided history: Head trauma, minor. Fall (hitting left side of head on concrete). Nausea/vomiting. Unsteady gait. EXAM: CT HEAD WITHOUT CONTRAST CT CERVICAL SPINE WITHOUT CONTRAST TECHNIQUE: Multidetector CT imaging of the head and cervical spine was performed following the standard protocol without intravenous contrast. Multiplanar CT image reconstructions of the cervical spine were also generated. RADIATION DOSE REDUCTION: This exam was performed according to the departmental dose-optimization program which includes automated exposure control, adjustment of the mA and/or kV according to patient size and/or use of iterative reconstruction technique. COMPARISON:  Head CT 06/10/2021.  Cervical spine CT 03/27/2021. FINDINGS: CT HEAD FINDINGS Brain: Generalized cerebral atrophy. Patchy and ill-defined hypoattenuation within the cerebral white matter, nonspecific but compatible with mild chronic small vessel ischemic disease. There is no acute intracranial hemorrhage. No demarcated cortical infarct. No extra-axial fluid collection. No evidence of an intracranial mass. No midline shift. Vascular: No hyperdense vessel.  Atherosclerotic calcifications. Skull: No calvarial fracture or aggressive osseous lesion. Sinuses/Orbits: No mass  or acute finding within the imaged orbits. Postsurgical appearance of the paranasal sinuses. Minimal mucosal thickening within the ethmoid and left maxillary sinuses. Other: Small focus of increased density within the right forehead soft tissues, likely reflecting sequelae of prior hematoma/laceration at this site. Redemonstrated chronic nasal bone fracture deformity. CT CERVICAL SPINE FINDINGS Alignment: Slight C6-C7 and C7-T1 grade 1 anterolisthesis. Skull base and vertebrae: The basion-dental and atlanto-dental intervals are maintained.No evidence of acute fracture to the  cervical spine. Facet ankylosis on the left at C2-C3. Soft tissues and spinal canal: Subcentimeter nodule within the left thyroid lobe not meeting consensus criteria for ultrasound follow-up based on size. No follow-up imaging is recommended. Reference: J Am Coll Radiol. 2015 Feb;12(2): 143-50. Disc levels: Cervical spondylosis with multilevel disc space narrowing, disc bulges/central disc protrusions, endplate spurring, uncovertebral hypertrophy and facet arthrosis. No appreciable high-grade spinal canal stenosis. Uncovertebral hypertrophy results in bony neural foraminal narrowing on the right at C5-C6. Degenerative changes also present at the C1-C2 articulation. Upper chest: No consolidation within the imaged lung apices. No visible pneumothorax. IMPRESSION: CT head: 1. No evidence of an acute intracranial abnormality. 2. Parenchymal atrophy and chronic small vessel ischemic disease. CT cervical spine: 1. No evidence of an acute cervical spine fracture. 2. Mild grade 1 anterolisthesis at C6-C7 and C7-T1. 3. Cervical spondylosis as described. 4. Facet ankylosis on the left at C2-C3. Electronically Signed   By: Jackey Loge D.O.   On: 09/10/2022 14:22    Microbiology: Results for orders placed or performed during the hospital encounter of 04/07/21  Surgical pcr screen     Status: Abnormal   Collection Time: 04/07/21  3:39 PM   Specimen: Nasal Mucosa; Nasal Swab  Result Value Ref Range Status   MRSA, PCR POSITIVE (A) NEGATIVE Final    Comment: RESULT CALLED TO, READ BACK BY AND VERIFIED WITH: BRYAN GRAY 04/07/21 1712 KLW    Staphylococcus aureus POSITIVE (A) NEGATIVE Final    Comment: (NOTE) The Xpert SA Assay (FDA approved for NASAL specimens in patients 55 years of age and older), is one component of a comprehensive surveillance program. It is not intended to diagnose infection nor to guide or monitor treatment. Performed at [facility], [address] [place], [place] [address]      Labs: CBC: Recent Labs  Lab 09/13/22 2006 09/14/22 0349 09/16/22 0422 09/17/22 0615 09/18/22 0509  WBC 10.0 12.5* 8.2 12.5* 9.1  NEUTROABS  --   --  7.1  --  5.6  HGB 11.9* 9.9* 8.5* 9.0* 9.1*  HCT 36.0 29.9* 25.6* 27.2* 27.2*  MCV 95.2 94.9 95.2 96.8 94.8  PLT 160 137* 90* 126* 134*   Basic Metabolic Panel: Recent Labs  Lab 09/13/22 2006 09/14/22 0349 09/16/22 0422 09/18/22 0509  NA 134* 136 134* 134*  K 4.0 4.8 4.3 4.2  CL 98 102 107 102  CO2 25 25 24 26   GLUCOSE 133* 154* 165* 98  BUN 32* 35* 16 21  CREATININE 1.02* 0.94 0.65 0.65  CALCIUM 8.9 8.4* 7.4* 7.9*   Liver Function Tests: No results for input(s): "AST", "ALT", "ALKPHOS", "BILITOT", "PROT", "ALBUMIN" in the last 168 hours. CBG: No results for input(s): "GLUCAP" in the last 168 hours.  Discharge time spent: greater than 30 minutes.  Signed: Lurene Shadow, MD [facility] 09/18/2022

## 2022-09-18 NOTE — Plan of Care (Signed)

## 2022-09-18 NOTE — Progress Notes (Signed)
Called [facility] to give report for patient. Spoke with Larene Beach, was told by Larene Beach that charge nurse would be given a message to call me back. Left [phone] call back number, name and where I was calling from with [place]ick[place].

## 2022-09-18 NOTE — Progress Notes (Signed)
Contacted and reported to [place] at [facility], phone number [phone]. Alejandra Wiley acknowledged understanding. Patient waiting to be transported by EMS.

## 2022-09-22 ENCOUNTER — Encounter: Payer: Self-pay | Admitting: Student

## 2022-09-22 ENCOUNTER — Non-Acute Institutional Stay (SKILLED_NURSING_FACILITY): Payer: Medicare Other | Admitting: Student

## 2022-09-22 DIAGNOSIS — J810 Acute pulmonary edema: Secondary | ICD-10-CM

## 2022-09-22 DIAGNOSIS — I10 Essential (primary) hypertension: Secondary | ICD-10-CM

## 2022-09-22 DIAGNOSIS — E039 Hypothyroidism, unspecified: Secondary | ICD-10-CM

## 2022-09-22 DIAGNOSIS — W19XXXD Unspecified fall, subsequent encounter: Secondary | ICD-10-CM

## 2022-09-22 DIAGNOSIS — S52201D Unspecified fracture of shaft of right ulna, subsequent encounter for closed fracture with routine healing: Secondary | ICD-10-CM

## 2022-09-22 DIAGNOSIS — T8484XS Pain due to internal orthopedic prosthetic devices, implants and grafts, sequela: Secondary | ICD-10-CM

## 2022-09-22 DIAGNOSIS — S72001D Fracture of unspecified part of neck of right femur, subsequent encounter for closed fracture with routine healing: Secondary | ICD-10-CM

## 2022-09-22 DIAGNOSIS — I779 Disorder of arteries and arterioles, unspecified: Secondary | ICD-10-CM

## 2022-09-22 DIAGNOSIS — I5033 Acute on chronic diastolic (congestive) heart failure: Secondary | ICD-10-CM

## 2022-09-22 DIAGNOSIS — I480 Paroxysmal atrial fibrillation: Secondary | ICD-10-CM

## 2022-09-22 DIAGNOSIS — Z96659 Presence of unspecified artificial knee joint: Secondary | ICD-10-CM

## 2022-09-22 DIAGNOSIS — S5291XD Unspecified fracture of right forearm, subsequent encounter for closed fracture with routine healing: Secondary | ICD-10-CM | POA: Diagnosis not present

## 2022-09-22 DIAGNOSIS — I5022 Chronic systolic (congestive) heart failure: Secondary | ICD-10-CM

## 2022-09-22 DIAGNOSIS — I5032 Chronic diastolic (congestive) heart failure: Secondary | ICD-10-CM

## 2022-09-22 NOTE — Progress Notes (Unsigned)
Provider:  Dr. Earnestine Mealing Location:  [place].  Nursing Home Room Number: [facility] 111A Place of Service:  SNF (31)  PCP: Lauro Regulus, MD Patient Care Team: Lauro Regulus, MD as PCP - General (Unknown Physician Specialty) Duke Salvia, MD as PCP - Cardiology (Cardiology) Duke Salvia, MD as PCP - Electrophysiology (Cardiology)  Extended Emergency Contact Information Primary Emergency Contact: [facility] Mobile Phone: [phone] Relation: Spouse Secondary Emergency Contact: Buchanan,Ellen Mobile Phone: [phone] Relation: Daughter  Code Status: DNR Goals of Care: Advanced Directive information    09/22/2022    9:47 AM  Advanced Directives  Does Patient Have a Medical Advance Directive? Yes  Type of Advance Directive Out of facility DNR (pink MOST or yellow form)  Does patient want to make changes to medical advance directive? No - Patient declined   Chief Complaint  Patient presents with   New Admit To SNF    Admission.     HPI: Patient is a 89 y.o. female seen today for admission to [facility].   She was at home and fell in her home. She broke her right wrist and her hip 8/23. ORIV on the radius, nonoperative management for the hip. Her pain has been moderately ocntrolled. She doesn't want to take any of the codones because it "wrecks her system." This time she had been so miserable and this morning before she got here she was able to have a bowel movement.   She had a fall while walking the dog earlier this year while picking up the dog's stool.   March 2023 she had a fall and landed on her right shoulder.   She always has a short nap after lunch.   She lives at home with her husband and schitzu. She has been married only about 1.5 years. She was a widow and he was a widower and they met here at [facility]. She is a Technical sales engineer. She has lived at [facility] for 12 years. They each had their children and grandchildren at the wedding at [place]. They had 500 ppl at a reception in  the hotel. She did real estate and taught piano teaching. Her husband was in the [employer] and nuclear power.    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 Rosita Kea during a back surgery in approx 2019.   Hypertension    Long term current use of anticoagulant    a.) dose reduced apixaban   MRSA nasal colonization 09/17/2016   Pacemaker 12/06/2003   Medtronic Impulse [id number]   Pacemaker 2015   PAF (paroxysmal atrial fibrillation) (HCC)    a.) CHA2DS2-VASc = 8 (age x 2, sex, CHF, HTN, CVA x 2, aortic plaque). b.) rate/rhythm maintained on oral amiodarone + metoprolol succinate; chronically anticoagulated with dose reduced apixaban   Sick sinus syndrome (HCC)    Sinoatrial node dysfunction (HCC)    Stroke (HCC)    Vaginal intraepithelial neoplasia    VAIN (vaginal intraepithelial neoplasia)    Past Surgical History:  Procedure Laterality Date   APPENDECTOMY     BICEPT TENODESIS Right 04/09/2021   Procedure: BICEPS TENODESIS;  Surgeon: Allena Katz,  Learta Codding, MD;  Location: [facility] ORS;  Service: Orthopedics;  Laterality: Right;   BREAST BIOPSY Left 05/12/2001   moderate epithelial hyperplasia without atypia and fibrocystic changes   COLONOSCOPY     HERNIA REPAIR     lower abd   IMAGE GUIDED SINUS SURGERY  2015   INSERT / REPLACE / REMOVE PACEMAKER  12/06/2003   Medtronic impulse [id number]   JOINT REPLACEMENT Right    knee   KYPHOPLASTY N/A 06/23/2017   Procedure: [id number];  Surgeon: Kennedy Bucker, MD;  Location: [facility] ORS;  Service: Orthopedics;  Laterality: N/A;   LEEP  1999   OPEN REDUCTION INTERNAL FIXATION (ORIF) DISTAL RADIAL FRACTURE Right  09/15/2022   Procedure: OPEN REDUCTION INTERNAL FIXATION (ORIF) DISTAL RADIUS FRACTURE;  Surgeon: Christena Flake, MD;  Location: [facility] ORS;  Service: Orthopedics;  Laterality: Right;   ORIF HIP FRACTURE Right 02/2016   PACEMAKER GENERATOR CHANGE N/A 09/18/2012   Procedure: PACEMAKER GENERATOR CHANGE;  Surgeon: Duke Salvia, MD;  Location: [facility] CATH LAB;  Service: Cardiovascular;  Laterality: N/A;   REVERSE SHOULDER ARTHROPLASTY Right 04/09/2021   Procedure: Right reverse shoulder arthroplasty for fracture;  Surgeon: Signa Kell, MD;  Location: [facility] ORS;  Service: Orthopedics;  Laterality: Right;   TONSILLECTOMY     TOTAL KNEE ARTHROPLASTY     TOTAL KNEE REVISION Right 09/23/2016   Procedure: TOTAL KNEE REVISION-POLYETHYLENE EXCHANGE;  Surgeon: Kennedy Bucker, MD;  Location: [facility] ORS;  Service: Orthopedics;  Laterality: Right;   VAGINAL HYSTERECTOMY      reports that she has never smoked. She has never used smokeless tobacco. She reports current alcohol use. She reports that she does not use drugs. Social History   Socioeconomic History   Marital status: Married    Spouse name: Jillyn Hidden   Number of children: Not on file   Years of education: Not on file   Highest education level: Not on file  Occupational History   Occupation: Part time  Tobacco Use   Smoking status: Never   Smokeless tobacco: Never  Vaping Use   Vaping status: Never Used  Substance and Sexual Activity   Alcohol use: Yes    Comment: 3 oz wine daily   Drug use: No   Sexual activity: Not Currently  Other Topics Concern   Not on file  Social History Narrative   Pt gets regular exercise.   Social Determinants of Health   Financial Resource Strain: Low Risk  (05/18/2022)   [facility]   Overall Financial Resource Strain (CARDIA)    Difficulty of Paying Living Expenses: Not hard at all  Food Insecurity: No Food Insecurity (09/14/2022)   Hunger Vital Sign     Worried About Running Out of Food in the Last Year: Never true    Ran Out of Food in the Last Year: Never true  Transportation Needs: No Transportation Needs (09/14/2022)   PRAPARE - Administrator, Civil Service (Medical): No    Lack of Transportation (Non-Medical): No  Physical Activity: Not on file  Stress: Not on file  Social Connections: Not on file  Intimate Partner Violence: Not At Risk (09/14/2022)   Humiliation, Afraid, Rape, and Kick questionnaire    Fear of Current or Ex-Partner: No    Emotionally Abused: No    Physically Abused: No    Sexually Abused: No    Functional Status Survey:    Family History  Problem Relation Age of Onset  Stroke Mother    Heart disease Father    Diabetes Daughter    Diabetes Son     Health Maintenance  Topic Date Due   DTaP/Tdap/Td (1 - Tdap) Never done   Zoster Vaccines- Shingrix (1 of 2) Never done   DEXA SCAN  Never done   Medicare Annual Wellness (AWV)  11/13/2021   INFLUENZA VACCINE  08/19/2022   COVID-19 Vaccine (4 - 2023-24 season) 09/19/2022   Pneumonia Vaccine 49+ Years old  Completed   HPV VACCINES  Aged Out    Allergies  Allergen Reactions   Latex Other (See Comments)    blisters   Other Other (See Comments)    Blisters/ paper tape is Education administrator paper tape is Education administrator paper tape is Musician paper tape is Ok   Tape Other (See Comments)    Blisters/ paper tape is Education administrator paper tape is Ok   Xarelto [Rivaroxaban] Other (See Comments)    Gums bleeding and too much bruising   Nickel Rash and Other (See Comments)    Outpatient Encounter Medications as of 09/22/2022  Medication Sig   acetaminophen (TYLENOL) 500 MG tablet Take 500 mg by mouth every 8 (eight) hours as needed for mild pain or headache.   amiodarone (PACERONE) 200 MG tablet Take 1 tablet by mouth 5 days a week   amLODipine (NORVASC) 2.5 MG tablet TAKE 1 TABLET DAILY   buPROPion (WELLBUTRIN XL) 150 MG 24 hr tablet Take 150 mg  by mouth daily.   ELIQUIS 2.5 MG TABS tablet TAKE 1 TABLET TWICE A DAY   HYDROcodone-acetaminophen (NORCO/VICODIN) 5-325 MG tablet Take 1-2 tablets by mouth every 6 (six) hours as needed for moderate pain.   ibandronate (BONIVA) 150 MG tablet Take 150 mg by mouth every 30 (thirty) days. Take in the morning with a full glass of water, on an empty stomach, and do not take anything else by mouth or lie down for the next 30 min.   Multiple Vitamins-Minerals (SENIOR MULTIVITAMIN PLUS PO) Take 1 tablet by mouth daily.   polyethylene glycol (MIRALAX / GLYCOLAX) 17 g packet Take 17 g by mouth daily as needed.   propranolol ER (INDERAL LA) 80 MG 24 hr capsule Take 1 capsule (80 mg total) by mouth daily.   No facility-administered encounter medications on file as of 09/22/2022.    Review of Systems  Vitals:   09/22/22 0937  BP: 132/76  Pulse: 70  Resp: 16  Temp: 98 F (36.7 C)  SpO2: 92%  Weight: 127 lb (57.6 kg)  Height: 5\' 6"  (1.676 m)   Body mass index is 20.5 kg/m. Physical Exam Constitutional:      Appearance: Normal appearance.  Cardiovascular:     Rate and Rhythm: Normal rate and regular rhythm.     Pulses: Normal pulses.  Pulmonary:     Effort: Pulmonary effort is normal.  Abdominal:     General: Abdomen is flat.     Palpations: Abdomen is soft.  Musculoskeletal:     Comments: Right wrist in soft cast with ace bandage.   Neurological:     General: No focal deficit present.     Mental Status: She is alert and oriented to person, place, and time. Mental status is at baseline.     Labs reviewed: Basic Metabolic Panel: Recent Labs    09/14/22 0349 09/16/22 0422 09/18/22 0509  NA 136 134* 134*  K 4.8 4.3 4.2  CL 102 107 102  CO2  25 24 26   GLUCOSE 154* 165* 98  BUN 35* 16 21  CREATININE 0.94 0.65 0.65  CALCIUM 8.4* 7.4* 7.9*   Liver Function Tests: Recent Labs    03/23/22 1204 07/12/22 0850  AST 42* 30  ALT 39 25  ALKPHOS 55 57  BILITOT 0.6 0.8  PROT 7.4  7.7  ALBUMIN 4.1 3.9   No results for input(s): "LIPASE", "AMYLASE" in the last 8760 hours. No results for input(s): "AMMONIA" in the last 8760 hours. CBC: Recent Labs    09/16/22 0422 09/17/22 0615 09/18/22 0509  WBC 8.2 12.5* 9.1  NEUTROABS 7.1  --  5.6  HGB 8.5* 9.0* 9.1*  HCT 25.6* 27.2* 27.2*  MCV 95.2 96.8 94.8  PLT 90* 126* 134*   Cardiac Enzymes: No results for input(s): "CKTOTAL", "CKMB", "CKMBINDEX", "TROPONINI" in the last 8760 hours. BNP: Invalid input(s): "POCBNP" Lab Results  Component Value Date   HGBA1C 6.7 (H) 02/10/2018   Lab Results  Component Value Date   TSH 1.812 03/23/2022   No results found for: "VITAMINB12" No results found for: "FOLATE" No results found for: "IRON", "TIBC", "FERRITIN"  Imaging and Procedures obtained prior to SNF admission: DG Wrist 2 Views Right  Result Date: 09/14/2022 CLINICAL DATA:  Splint placement EXAM: RIGHT WRIST - 2 VIEW COMPARISON:  09/13/2022 FINDINGS: Improved alignment of radius and ulna fractures, with mild residual dorsal displacement at the radius fracture site. IMPRESSION: Improved alignment of radius and ulna fractures status post splint placement. Electronically Signed   By: Deatra Robinson M.D.   On: 09/14/2022 00:54   DG Hip Unilat W or Wo Pelvis 2-3 Views Right  Result Date: 09/13/2022 CLINICAL DATA:  Right hip pain, fell EXAM: DG HIP (WITH OR WITHOUT PELVIS) 2-3V RIGHT COMPARISON:  09/12/2017 FINDINGS: Frontal view of the pelvis as well as frontal and cross-table lateral views of the right hip are obtained. Right hip and knee arthroplasty is again identified, with a periprosthetic proximal right femur fracture identified along the greater trochanter. Soft tissue swelling overlies the right hip. Minimally displaced fractures are also seen through the right superior and inferior pubic rami. No other acute pelvic fractures. Stable left hip osteoarthritis. IMPRESSION: 1. Acute periprosthetic proximal right femur  fracture, with fracture line extending through the greater trochanter. 2. Acute minimally displaced right superior and inferior pubic rami fractures. 3. Left hip osteoarthritis. Electronically Signed   By: Sharlet Salina M.D.   On: 09/13/2022 21:18   DG Wrist Complete Right  Result Date: 09/13/2022 CLINICAL DATA:  Trauma to the right wrist. EXAM: RIGHT WRIST - COMPLETE 3+ VIEW COMPARISON:  None Available. FINDINGS: Displaced fractures of the distal radius and ulna with dorsal angulation and displacement of the distal fracture fragments. The distal radial fracture appears to extend into the articular surface of the wrist. There is also fracture of the ulnar styloid. No dislocation. There is soft tissue swelling of the wrist. No opaque foreign object/catheterization. IMPRESSION: Displaced fractures of the distal radius and ulna. Electronically Signed   By: Elgie Collard M.D.   On: 09/13/2022 21:17    Assessment/Plan Acute on chronic diastolic (congestive) heart failure (HCC), Chronic  Closed fracture of right radius and ulna with routine healing, subsequent encounter  Pain due to total knee replacement, unspecified laterality, sequela  Essential hypertension  Closed fracture of right hip with routine healing, subsequent encounter  Chronic diastolic heart failure (HCC)  Chronic systolic (congestive) heart failure (HCC)  Carotid artery disease, unspecified laterality, unspecified type (HCC)  Paroxysmal atrial fibrillation (HCC)  Acquired hypothyroidism  Fall, subsequent encounter S/p mechanical fall with distal radial, ulnar hip fracture on 8/28 s/p ORIF of right radius. Non surgical hip. Pain controlled with hydrocodone. Schedule tylenol. Rate well-controlled at this time with amiodarone and metoprolol. Hgb stable, repeat CBC. Platelet counts stable. BM since arrival, continue PRN miralax. Depression stable, continue wellbutrin. BP well-controlled. Continue physical therapy. TSH within  goal range, continue levothyroxine  Family/ staff Communication: nursing  Labs/tests ordered: CBC, BMP

## 2022-09-23 ENCOUNTER — Encounter: Payer: Self-pay | Admitting: Student

## 2022-10-04 ENCOUNTER — Encounter: Payer: Self-pay | Admitting: Internal Medicine

## 2022-10-04 ENCOUNTER — Encounter: Payer: Self-pay | Admitting: Surgery

## 2022-10-04 ENCOUNTER — Non-Acute Institutional Stay (SKILLED_NURSING_FACILITY): Payer: Medicare Other | Admitting: Student

## 2022-10-04 DIAGNOSIS — I5032 Chronic diastolic (congestive) heart failure: Secondary | ICD-10-CM | POA: Diagnosis not present

## 2022-10-04 DIAGNOSIS — I480 Paroxysmal atrial fibrillation: Secondary | ICD-10-CM

## 2022-10-04 NOTE — Progress Notes (Unsigned)
Location:  Other [place].  Nursing Home Room Number: [facility] 111A Place of Service:  SNF [phone]) Provider:  Dr. Earnestine Mealing  PCP: Lauro Regulus, MD  Patient Care Team: Lauro Regulus, MD as PCP - General (Unknown Physician Specialty) Duke Salvia, MD as PCP - Cardiology (Cardiology) Duke Salvia, MD as PCP - Electrophysiology (Cardiology)  Extended Emergency Contact Information Primary Emergency Contact: [facility] Mobile Phone: [phone] Relation: Spouse Secondary Emergency Contact: Buchanan,Ellen Mobile Phone: [phone] Relation: Daughter  Code Status:  DNR Goals of care: Advanced Directive information    10/04/2022    2:54 PM  Advanced Directives  Does Patient Have a Medical Advance Directive? Yes  Type of Advance Directive Out of facility DNR (pink MOST or yellow form)  Does patient want to make changes to medical advance directive? No - Patient declined  Would patient like information on creating a medical advance directive? No - Patient declined     Chief Complaint  Patient presents with   Acute Visit    Hypotension.     HPI:  Pt is a 89 y.o. female seen today for an acute visit for Hypotension.   She drinks 6 ounces of water per day and just today tried increasing to 18 ounces of water. Discussed concern that this is not enough to maintain a normal blood pressure. She had a couple of high BP without symptoms.   PT/OT had to stop therapy because of drops in BP 90s-80s/70-60s.     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 Rosita Kea during a back surgery in approx 2019.   Hypertension    Long term current use of  anticoagulant    a.) dose reduced apixaban   MRSA nasal colonization 09/17/2016   Pacemaker 12/06/2003   Medtronic Impulse [id number]   Pacemaker 2015   PAF (paroxysmal atrial fibrillation) (HCC)    a.) CHA2DS2-VASc = 8 (age x 2, sex, CHF, HTN, CVA x 2, aortic plaque). b.) rate/rhythm maintained on oral amiodarone + metoprolol succinate; chronically anticoagulated with dose reduced apixaban   Sick sinus syndrome (HCC)    Sinoatrial node dysfunction (HCC)    Stroke (HCC)    Vaginal intraepithelial neoplasia    VAIN (vaginal intraepithelial neoplasia)    Past Surgical History:  Procedure Laterality Date   APPENDECTOMY     BICEPT TENODESIS Right 04/09/2021   Procedure: BICEPS TENODESIS;  Surgeon: Signa Kell, MD;  Location: [facility] ORS;  Service: Orthopedics;  Laterality: Right;   BREAST BIOPSY Left 05/12/2001   moderate epithelial hyperplasia without atypia and fibrocystic changes   COLONOSCOPY     HERNIA REPAIR     lower abd   IMAGE GUIDED SINUS SURGERY  2015   INSERT / REPLACE / REMOVE PACEMAKER  12/06/2003   Medtronic impulse [id number]   JOINT REPLACEMENT Right    knee   KYPHOPLASTY N/A 06/23/2017   Procedure: [id number];  Surgeon: Kennedy Bucker, MD;  Location: [facility] ORS;  Service: Orthopedics;  Laterality: N/A;   LEEP  1999   OPEN REDUCTION INTERNAL FIXATION (ORIF) DISTAL RADIAL FRACTURE Right 09/15/2022   Procedure: OPEN REDUCTION INTERNAL FIXATION (ORIF) DISTAL RADIUS  FRACTURE;  Surgeon: Christena Flake, MD;  Location: [facility] ORS;  Service: Orthopedics;  Laterality: Right;   ORIF HIP FRACTURE Right 02/2016   PACEMAKER GENERATOR CHANGE N/A 09/18/2012   Procedure: PACEMAKER GENERATOR CHANGE;  Surgeon: Duke Salvia, MD;  Location: [facility] C[facility] LAB;  Service: Cardiovascular;  Laterality: N/A;   REVERSE SHOULDER ARTHROPLASTY Right 04/09/2021   Procedure: Right reverse shoulder arthroplasty for fracture;  Surgeon: Signa Kell, MD;  Location: [facility] ORS;  Service: Orthopedics;  Laterality: Right;    TONSILLECTOMY     TOTAL KNEE ARTHROPLASTY     TOTAL KNEE REVISION Right 09/23/2016   Procedure: TOTAL KNEE REVISION-POLYETHYLENE EXCHANGE;  Surgeon: Kennedy Bucker, MD;  Location: [facility] ORS;  Service: Orthopedics;  Laterality: Right;   VAGINAL HYSTERECTOMY      Allergies  Allergen Reactions   Latex Other (See Comments)    blisters   Other Other (See Comments)    Blisters/ paper tape is Education administrator paper tape is Education administrator paper tape is Musician paper tape is Ok   Tape Other (See Comments)    Blisters/ paper tape is Education administrator paper tape is Ok   Xarelto [Rivaroxaban] Other (See Comments)    Gums bleeding and too much bruising   Nickel Rash and Other (See Comments)    Outpatient Encounter Medications as of 10/04/2022  Medication Sig   acetaminophen (TYLENOL) 500 MG tablet Take 500 mg by mouth every 8 (eight) hours as needed for mild pain or headache. Give 2 tablet by mouth twice daily   amiodarone (PACERONE) 200 MG tablet Take 1 tablet by mouth 5 days a week   buPROPion (WELLBUTRIN XL) 150 MG 24 hr tablet Take 150 mg by mouth daily.   ELIQUIS 2.5 MG TABS tablet TAKE 1 TABLET TWICE A DAY   fluticasone (FLONASE) 50 MCG/ACT nasal spray Place 2 sprays into both nostrils daily.   HYDROcodone-acetaminophen (NORCO/VICODIN) 5-325 MG tablet Take 1-2 tablets by mouth every 6 (six) hours as needed for moderate pain.   ibandronate (BONIVA) 150 MG tablet Take 150 mg by mouth every 30 (thirty) days. Take in the morning with a full glass of water, on an empty stomach, and do not take anything else by mouth or lie down for the next 30 min.   Multiple Vitamins-Minerals (SENIOR MULTIVITAMIN PLUS PO) Take 1 tablet by mouth daily.   polyethylene glycol (MIRALAX / GLYCOLAX) 17 g packet Take 17 g by mouth daily as needed.   propranolol ER (INDERAL LA) 80 MG 24 hr capsule Take 1 capsule (80 mg total) by mouth daily.   [DISCONTINUED] amLODipine (NORVASC) 2.5 MG tablet TAKE 1 TABLET DAILY   No  facility-administered encounter medications on file as of 10/04/2022.    Review of Systems  Immunization History  Administered Date(s) Administered   Influenza Inj Mdck Quad Pf 11/08/2018   Influenza, High Dose Seasonal PF 09/10/2016, 11/30/2017   Influenza-Unspecified 11/20/2012, 10/18/2013, 11/27/2014, 10/24/2015, 10/18/2021   Moderna Sars-Covid-2 Vaccination 02/02/2019, 03/02/2019, 11/24/2019   Pneumococcal Conjugate-13 09/02/2016   Pneumococcal Polysaccharide-23 05/19/2011   Pertinent  Health Maintenance Due  Topic Date Due   DEXA SCAN  Never done   INFLUENZA VACCINE  08/19/2022      12/31/2020    9:45 AM 03/27/2021    5:29 PM 04/09/2021   12:00 PM 04/09/2021   10:37 PM 04/10/2021   10:00 AM  Fall Risk  (RETIRED) Patient Fall Risk Level Moderate fall risk Low fall risk High fall risk High fall  risk High fall risk   Functional Status Survey:    Vitals:   10/04/22 1450  BP: 130/77  Pulse: 70  Resp: 16  Temp: 97.9 F (36.6 C)  SpO2: 95%  Weight: 119 lb (54 kg)  Height: 5\' 6"  (1.676 m)   Body mass index is 19.21 kg/m. Physical Exam Constitutional:      Appearance: Normal appearance.  Cardiovascular:     Rate and Rhythm: Normal rate.     Pulses: Normal pulses.  Pulmonary:     Effort: Pulmonary effort is normal.  Abdominal:     General: Abdomen is flat.  Neurological:     Mental Status: She is alert and oriented to person, place, and time.     Labs reviewed: Recent Labs    09/14/22 0349 09/16/22 0422 09/18/22 0509  NA 136 134* 134*  K 4.8 4.3 4.2  CL 102 107 102  CO2 25 24 26   GLUCOSE 154* 165* 98  BUN 35* 16 21  CREATININE 0.94 0.65 0.65  CALCIUM 8.4* 7.4* 7.9*   Recent Labs    03/23/22 1204 07/12/22 0850  AST 42* 30  ALT 39 25  ALKPHOS 55 57  BILITOT 0.6 0.8  PROT 7.4 7.7  ALBUMIN 4.1 3.9   Recent Labs    09/16/22 0422 09/17/22 0615 09/18/22 0509  WBC 8.2 12.5* 9.1  NEUTROABS 7.1  --  5.6  HGB 8.5* 9.0* 9.1*  HCT 25.6* 27.2*  27.2*  MCV 95.2 96.8 94.8  PLT 90* 126* 134*   Lab Results  Component Value Date   TSH 1.812 03/23/2022   Lab Results  Component Value Date   HGBA1C 6.7 (H) 02/10/2018   Lab Results  Component Value Date   CHOL 157 02/10/2018   HDL 62 02/10/2018   LDLCALC 83 02/10/2018   TRIG 61 02/10/2018   CHOLHDL 2.5 02/10/2018    Significant Diagnostic Results in last 30 days:  DG Wrist 2 Views Right  Result Date: 09/14/2022 CLINICAL DATA:  Splint placement EXAM: RIGHT WRIST - 2 VIEW COMPARISON:  09/13/2022 FINDINGS: Improved alignment of radius and ulna fractures, with mild residual dorsal displacement at the radius fracture site. IMPRESSION: Improved alignment of radius and ulna fractures status post splint placement. Electronically Signed   By: Deatra Robinson M.D.   On: 09/14/2022 00:54   DG Hip Unilat W or Wo Pelvis 2-3 Views Right  Result Date: 09/13/2022 CLINICAL DATA:  Right hip pain, fell EXAM: DG HIP (WITH OR WITHOUT PELVIS) 2-3V RIGHT COMPARISON:  09/12/2017 FINDINGS: Frontal view of the pelvis as well as frontal and cross-table lateral views of the right hip are obtained. Right hip and knee arthroplasty is again identified, with a periprosthetic proximal right femur fracture identified along the greater trochanter. Soft tissue swelling overlies the right hip. Minimally displaced fractures are also seen through the right superior and inferior pubic rami. No other acute pelvic fractures. Stable left hip osteoarthritis. IMPRESSION: 1. Acute periprosthetic proximal right femur fracture, with fracture line extending through the greater trochanter. 2. Acute minimally displaced right superior and inferior pubic rami fractures. 3. Left hip osteoarthritis. Electronically Signed   By: Sharlet Salina M.D.   On: 09/13/2022 21:18   DG Wrist Complete Right  Result Date: 09/13/2022 CLINICAL DATA:  Trauma to the right wrist. EXAM: RIGHT WRIST - COMPLETE 3+ VIEW COMPARISON:  None Available. FINDINGS:  Displaced fractures of the distal radius and ulna with dorsal angulation and displacement of the distal fracture fragments. The distal  radial fracture appears to extend into the articular surface of the wrist. There is also fracture of the ulnar styloid. No dislocation. There is soft tissue swelling of the wrist. No opaque foreign object/catheterization. IMPRESSION: Displaced fractures of the distal radius and ulna. Electronically Signed   By: Elgie Collard M.D.   On: 09/13/2022 21:17   CT Head Wo Contrast  Result Date: 09/10/2022 CLINICAL DATA:  Provided history: Head trauma, minor. Fall (hitting left side of head on concrete). Nausea/vomiting. Unsteady gait. EXAM: CT HEAD WITHOUT CONTRAST CT CERVICAL SPINE WITHOUT CONTRAST TECHNIQUE: Multidetector CT imaging of the head and cervical spine was performed following the standard protocol without intravenous contrast. Multiplanar CT image reconstructions of the cervical spine were also generated. RADIATION DOSE REDUCTION: This exam was performed according to the departmental dose-optimization program which includes automated exposure control, adjustment of the mA and/or kV according to patient size and/or use of iterative reconstruction technique. COMPARISON:  Head CT 06/10/2021.  Cervical spine CT 03/27/2021. FINDINGS: CT HEAD FINDINGS Brain: Generalized cerebral atrophy. Patchy and ill-defined hypoattenuation within the cerebral white matter, nonspecific but compatible with mild chronic small vessel ischemic disease. There is no acute intracranial hemorrhage. No demarcated cortical infarct. No extra-axial fluid collection. No evidence of an intracranial mass. No midline shift. Vascular: No hyperdense vessel.  Atherosclerotic calcifications. Skull: No calvarial fracture or aggressive osseous lesion. Sinuses/Orbits: No mass or acute finding within the imaged orbits. Postsurgical appearance of the paranasal sinuses. Minimal mucosal thickening within the ethmoid  and left maxillary sinuses. Other: Small focus of increased density within the right forehead soft tissues, likely reflecting sequelae of prior hematoma/laceration at this site. Redemonstrated chronic nasal bone fracture deformity. CT CERVICAL SPINE FINDINGS Alignment: Slight C6-C7 and C7-T1 grade 1 anterolisthesis. Skull base and vertebrae: The basion-dental and atlanto-dental intervals are maintained.No evidence of acute fracture to the cervical spine. Facet ankylosis on the left at C2-C3. Soft tissues and spinal canal: Subcentimeter nodule within the left thyroid lobe not meeting consensus criteria for ultrasound follow-up based on size. No follow-up imaging is recommended. Reference: J Am Coll Radiol. 2015 Feb;12(2): 143-50. Disc levels: Cervical spondylosis with multilevel disc space narrowing, disc bulges/central disc protrusions, endplate spurring, uncovertebral hypertrophy and facet arthrosis. No appreciable high-grade spinal canal stenosis. Uncovertebral hypertrophy results in bony neural foraminal narrowing on the right at C5-C6. Degenerative changes also present at the C1-C2 articulation. Upper chest: No consolidation within the imaged lung apices. No visible pneumothorax. IMPRESSION: CT head: 1. No evidence of an acute intracranial abnormality. 2. Parenchymal atrophy and chronic small vessel ischemic disease. CT cervical spine: 1. No evidence of an acute cervical spine fracture. 2. Mild grade 1 anterolisthesis at C6-C7 and C7-T1. 3. Cervical spondylosis as described. 4. Facet ankylosis on the left at C2-C3. Electronically Signed   By: Jackey Loge D.O.   On: 09/10/2022 14:22   CT Cervical Spine Wo Contrast  Result Date: 09/10/2022 CLINICAL DATA:  Provided history: Head trauma, minor. Fall (hitting left side of head on concrete). Nausea/vomiting. Unsteady gait. EXAM: CT HEAD WITHOUT CONTRAST CT CERVICAL SPINE WITHOUT CONTRAST TECHNIQUE: Multidetector CT imaging of the head and cervical spine was  performed following the standard protocol without intravenous contrast. Multiplanar CT image reconstructions of the cervical spine were also generated. RADIATION DOSE REDUCTION: This exam was performed according to the departmental dose-optimization program which includes automated exposure control, adjustment of the mA and/or kV according to patient size and/or use of iterative reconstruction technique. COMPARISON:  Head CT 06/10/2021.  Cervical spine CT 03/27/2021. FINDINGS: CT HEAD FINDINGS Brain: Generalized cerebral atrophy. Patchy and ill-defined hypoattenuation within the cerebral white matter, nonspecific but compatible with mild chronic small vessel ischemic disease. There is no acute intracranial hemorrhage. No demarcated cortical infarct. No extra-axial fluid collection. No evidence of an intracranial mass. No midline shift. Vascular: No hyperdense vessel.  Atherosclerotic calcifications. Skull: No calvarial fracture or aggressive osseous lesion. Sinuses/Orbits: No mass or acute finding within the imaged orbits. Postsurgical appearance of the paranasal sinuses. Minimal mucosal thickening within the ethmoid and left maxillary sinuses. Other: Small focus of increased density within the right forehead soft tissues, likely reflecting sequelae of prior hematoma/laceration at this site. Redemonstrated chronic nasal bone fracture deformity. CT CERVICAL SPINE FINDINGS Alignment: Slight C6-C7 and C7-T1 grade 1 anterolisthesis. Skull base and vertebrae: The basion-dental and atlanto-dental intervals are maintained.No evidence of acute fracture to the cervical spine. Facet ankylosis on the left at C2-C3. Soft tissues and spinal canal: Subcentimeter nodule within the left thyroid lobe not meeting consensus criteria for ultrasound follow-up based on size. No follow-up imaging is recommended. Reference: J Am Coll Radiol. 2015 Feb;12(2): 143-50. Disc levels: Cervical spondylosis with multilevel disc space narrowing,  disc bulges/central disc protrusions, endplate spurring, uncovertebral hypertrophy and facet arthrosis. No appreciable high-grade spinal canal stenosis. Uncovertebral hypertrophy results in bony neural foraminal narrowing on the right at C5-C6. Degenerative changes also present at the C1-C2 articulation. Upper chest: No consolidation within the imaged lung apices. No visible pneumothorax. IMPRESSION: CT head: 1. No evidence of an acute intracranial abnormality. 2. Parenchymal atrophy and chronic small vessel ischemic disease. CT cervical spine: 1. No evidence of an acute cervical spine fracture. 2. Mild grade 1 anterolisthesis at C6-C7 and C7-T1. 3. Cervical spondylosis as described. 4. Facet ankylosis on the left at C2-C3. Electronically Signed   By: Jackey Loge D.O.   On: 09/10/2022 14:22    Assessment/Plan Paroxysmal atrial fibrillation (HCC)  Chronic diastolic heart failure (HCC) Hx of atrial fibrillation. On Amiodarone and has a Visual merchandiser. Discussed last echocardiogram >2 years ago, could reapeat. Will message cardiology regarding where she should have this done - in house vs [facility]. ECG tomorrow. Encourage hydration of ~6 cups of water per day.    Family/ staff Communication: nursing  Labs/tests ordered:  CMP< CBC, BNP.   I spent greater than 30 minutes for the care of this patient in face to face time, chart review, clinical documentation, patient education.

## 2022-10-05 ENCOUNTER — Encounter: Payer: Self-pay | Admitting: Student

## 2022-10-05 ENCOUNTER — Telehealth: Payer: Medicare Other | Admitting: Student

## 2022-10-05 NOTE — Telephone Encounter (Signed)
Spoke with patient's cardiologist, Dr. Graciela Husbands, regarding her orthostatic hypotension  2L of fluids daily OOB all the time Midodrine 2.5 mg 7am 11am and 3pm Abdominal binder is an option if If she needs to.  Not flat unless she is asleep - can she get in reverse trendelenburg.   Fludricorticone is not a good idea as it increases risk of supine hypertension and she already has hypertension.   Physostigmine is the third option could help which decreases the decay of NE transmitters when she stands. It augments BP for her because it's when she stands it doesn't impact when she is flat.   Called facility to relay the plan.

## 2022-10-07 LAB — CBC AND DIFFERENTIAL
HCT: 35 — AB (ref 36–46)
Hemoglobin: 11.3 — AB (ref 12.0–16.0)
Neutrophils Absolute: 4883
Platelets: 301 10*3/uL (ref 150–400)
WBC: 7.5

## 2022-10-07 LAB — BASIC METABOLIC PANEL
BUN: 14 (ref 4–21)
CO2: 24 — AB (ref 13–22)
Chloride: 95 — AB (ref 99–108)
Creatinine: 0.8 (ref 0.5–1.1)
Glucose: 104
Potassium: 4.5 mEq/L (ref 3.5–5.1)
Sodium: 130 — AB (ref 137–147)

## 2022-10-07 LAB — CBC: RBC: 3.52 — AB (ref 3.87–5.11)

## 2022-10-07 LAB — COMPREHENSIVE METABOLIC PANEL
Calcium: 9.1 (ref 8.7–10.7)
eGFR: 71

## 2022-10-08 ENCOUNTER — Non-Acute Institutional Stay (SKILLED_NURSING_FACILITY): Payer: Medicare Other | Admitting: Student

## 2022-10-08 ENCOUNTER — Encounter: Payer: Self-pay | Admitting: Student

## 2022-10-08 DIAGNOSIS — E871 Hypo-osmolality and hyponatremia: Secondary | ICD-10-CM

## 2022-10-08 NOTE — Progress Notes (Unsigned)
Location:  Other [place].  Nursing Home Room Number: [facility] 111A Place of Service:  SNF [phone]) Provider:  Dr. Earnestine Mealing  PCP: Lauro Regulus, MD  Patient Care Team: Lauro Regulus, MD as PCP - General (Unknown Physician Specialty) Duke Salvia, MD as PCP - Cardiology (Cardiology) Duke Salvia, MD as PCP - Electrophysiology (Cardiology)  Extended Emergency Contact Information Primary Emergency Contact: [facility] Mobile Phone: [phone] Relation: Spouse Secondary Emergency Contact: Buchanan,Ellen Mobile Phone: [phone] Relation: Daughter  Code Status:  DNR Goals of care: Advanced Directive information    10/08/2022    3:44 PM  Advanced Directives  Does Patient Have a Medical Advance Directive? Yes  Type of Advance Directive Out of facility DNR (pink MOST or yellow form)  Does patient want to make changes to medical advance directive? No - Patient declined     Chief Complaint  Patient presents with   Acute Visit    Lab Follow up    HPI:  Pt is a 89 y.o. female seen today for an acute visit for Lab Follow up  Patient with notable hyponatremia. Slight decrease over the last few days. Discussed the importance of maintaining sodium for BP control.   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 Rosita Kea during a back surgery in approx 2019.   Hypertension    Long term current use of anticoagulant    a.) dose reduced apixaban   MRSA nasal colonization 09/17/2016   Pacemaker 12/06/2003   Medtronic Impulse [id number]   Pacemaker 2015   PAF (paroxysmal atrial fibrillation) (HCC)    a.) CHA2DS2-VASc = 8 (age x 2, sex, CHF, HTN, CVA x 2,  aortic plaque). b.) rate/rhythm maintained on oral amiodarone + metoprolol succinate; chronically anticoagulated with dose reduced apixaban   Sick sinus syndrome (HCC)    Sinoatrial node dysfunction (HCC)    Stroke (HCC)    Vaginal intraepithelial neoplasia    VAIN (vaginal intraepithelial neoplasia)    Past Surgical History:  Procedure Laterality Date   APPENDECTOMY     BICEPT TENODESIS Right 04/09/2021   Procedure: BICEPS TENODESIS;  Surgeon: Signa Kell, MD;  Location: [facility] ORS;  Service: Orthopedics;  Laterality: Right;   BREAST BIOPSY Left 05/12/2001   moderate epithelial hyperplasia without atypia and fibrocystic changes   COLONOSCOPY     HERNIA REPAIR     lower abd   IMAGE GUIDED SINUS SURGERY  2015   INSERT / REPLACE / REMOVE PACEMAKER  12/06/2003   Medtronic impulse [id number]   JOINT REPLACEMENT Right    knee   KYPHOPLASTY N/A 06/23/2017   Procedure: [id number];  Surgeon: Kennedy Bucker, MD;  Location: [facility] ORS;  Service: Orthopedics;  Laterality: N/A;   LEEP  1999   OPEN REDUCTION INTERNAL FIXATION (ORIF) DISTAL RADIAL FRACTURE Right 09/15/2022   Procedure: OPEN REDUCTION INTERNAL FIXATION (ORIF) DISTAL RADIUS FRACTURE;  Surgeon: Christena Flake, MD;  Location: [facility] ORS;  Service: Orthopedics;  Laterality: Right;   ORIF HIP FRACTURE Right 02/2016   PACEMAKER GENERATOR CHANGE N/A 09/18/2012   Procedure: PACEMAKER GENERATOR CHANGE;  Surgeon: Duke Salvia, MD;  Location: [facility] CATH LAB;  Service: Cardiovascular;  Laterality: N/A;   REVERSE SHOULDER ARTHROPLASTY Right 04/09/2021   Procedure: Right reverse shoulder arthroplasty for fracture;  Surgeon: Signa Kell, MD;  Location: [facility] ORS;  Service: Orthopedics;  Laterality: Right;   TONSILLECTOMY     TOTAL KNEE ARTHROPLASTY     TOTAL KNEE REVISION Right 09/23/2016   Procedure: TOTAL KNEE REVISION-POLYETHYLENE EXCHANGE;  Surgeon: Kennedy Bucker, MD;  Location: [facility] ORS;  Service: Orthopedics;  Laterality: Right;   VAGINAL  HYSTERECTOMY      Allergies  Allergen Reactions   Latex Other (See Comments)    blisters   Other Other (See Comments)    Blisters/ paper tape is Education administrator paper tape is Education administrator paper tape is Musician paper tape is Ok   Tape Other (See Comments)    Blisters/ paper tape is Education administrator paper tape is Ok   Xarelto [Rivaroxaban] Other (See Comments)    Gums bleeding and too much bruising   Nickel Rash and Other (See Comments)    Outpatient Encounter Medications as of 10/08/2022  Medication Sig   acetaminophen (TYLENOL) 500 MG tablet Take 500 mg by mouth every 8 (eight) hours as needed for mild pain or headache. Give 2 tablet by mouth twice daily   amiodarone (PACERONE) 200 MG tablet Take 1 tablet by mouth 5 days a week   buPROPion (WELLBUTRIN XL) 150 MG 24 hr tablet Take 150 mg by mouth daily.   ELIQUIS 2.5 MG TABS tablet TAKE 1 TABLET TWICE A DAY   fluticasone (FLONASE) 50 MCG/ACT nasal spray Place 2 sprays into both nostrils daily.   HYDROcodone-acetaminophen (NORCO/VICODIN) 5-325 MG tablet Take 1-2 tablets by mouth every 6 (six) hours as needed for moderate pain.   ibandronate (BONIVA) 150 MG tablet Take 150 mg by mouth every 30 (thirty) days. Take in the morning with a full glass of water, on an empty stomach, and do not take anything else by mouth or lie down for the next 30 min.   midodrine (PROAMATINE) 2.5 MG tablet Take 2.5 mg by mouth 3 (three) times daily with meals.   Multiple Vitamins-Minerals (SENIOR MULTIVITAMIN PLUS PO) Take 1 tablet by mouth daily.   polyethylene glycol (MIRALAX / GLYCOLAX) 17 g packet Take 17 g by mouth daily as needed.   propranolol ER (INDERAL LA) 80 MG 24 hr capsule Take 1 capsule (80 mg total) by mouth daily.   SODIUM CHLORIDE PO Take 500 mg by mouth 2 (two) times daily.   No facility-administered encounter medications on file as of 10/08/2022.    Review of Systems  Immunization History  Administered Date(s) Administered    Influenza Inj Mdck Quad Pf 11/08/2018   Influenza, High Dose Seasonal PF 09/10/2016, 11/30/2017   Influenza-Unspecified 11/20/2012, 10/18/2013, 11/27/2014, 10/24/2015, 10/18/2021   Moderna Sars-Covid-2 Vaccination 02/02/2019, 03/02/2019, 11/24/2019   Pneumococcal Conjugate-13 09/02/2016   Pneumococcal Polysaccharide-23 05/19/2011   Pertinent  Health Maintenance Due  Topic Date Due   DEXA SCAN  Never done   INFLUENZA VACCINE  08/19/2022      12/31/2020    9:45 AM 03/27/2021    5:29 PM 04/09/2021   12:00 PM 04/09/2021   10:37 PM 04/10/2021   10:00 AM  Fall Risk  (RETIRED) Patient Fall Risk Level Moderate fall risk Low fall risk High fall risk High fall risk High fall risk   Functional Status Survey:    Vitals:   10/08/22 1536  BP: 117/64  Pulse: 75  Resp: 16  Temp: (!)  97.3 F (36.3 C)  SpO2: 96%  Weight: 117 lb 9.6 oz (53.3 kg)  Height: 5\' 6"  (1.676 m)   Body mass index is 18.98 kg/m. Physical Exam Constitutional:      Appearance: Normal appearance.  Cardiovascular:     Rate and Rhythm: Normal rate and regular rhythm.  Pulmonary:     Effort: Pulmonary effort is normal.     Breath sounds: Normal breath sounds.  Neurological:     Mental Status: She is alert and oriented to person, place, and time.     Labs reviewed: Recent Labs    09/14/22 0349 09/16/22 0422 09/18/22 0509 10/07/22 0000  NA 136 134* 134* 130*  K 4.8 4.3 4.2 4.5  CL 102 107 102 95*  CO2 25 24 26  24*  GLUCOSE 154* 165* 98  --   BUN 35* 16 21 14   CREATININE 0.94 0.65 0.65 0.8  CALCIUM 8.4* 7.4* 7.9* 9.1   Recent Labs    03/23/22 1204 07/12/22 0850  AST 42* 30  ALT 39 25  ALKPHOS 55 57  BILITOT 0.6 0.8  PROT 7.4 7.7  ALBUMIN 4.1 3.9   Recent Labs    09/16/22 0422 09/17/22 0615 09/18/22 0509 10/07/22 0000  WBC 8.2 12.5* 9.1 7.5  NEUTROABS 7.1  --  5.6 4,883.00  HGB 8.5* 9.0* 9.1* 11.3*  HCT 25.6* 27.2* 27.2* 35*  MCV 95.2 96.8 94.8  --   PLT 90* 126* 134* 301   Lab Results   Component Value Date   TSH 1.812 03/23/2022   Lab Results  Component Value Date   HGBA1C 6.7 (H) 02/10/2018   Lab Results  Component Value Date   CHOL 157 02/10/2018   HDL 62 02/10/2018   LDLCALC 83 02/10/2018   TRIG 61 02/10/2018   CHOLHDL 2.5 02/10/2018    Significant Diagnostic Results in last 30 days:  DG Wrist 2 Views Right  Result Date: 09/14/2022 CLINICAL DATA:  Splint placement EXAM: RIGHT WRIST - 2 VIEW COMPARISON:  09/13/2022 FINDINGS: Improved alignment of radius and ulna fractures, with mild residual dorsal displacement at the radius fracture site. IMPRESSION: Improved alignment of radius and ulna fractures status post splint placement. Electronically Signed   By: Deatra Robinson M.D.   On: 09/14/2022 00:54   DG Hip Unilat W or Wo Pelvis 2-3 Views Right  Result Date: 09/13/2022 CLINICAL DATA:  Right hip pain, fell EXAM: DG HIP (WITH OR WITHOUT PELVIS) 2-3V RIGHT COMPARISON:  09/12/2017 FINDINGS: Frontal view of the pelvis as well as frontal and cross-table lateral views of the right hip are obtained. Right hip and knee arthroplasty is again identified, with a periprosthetic proximal right femur fracture identified along the greater trochanter. Soft tissue swelling overlies the right hip. Minimally displaced fractures are also seen through the right superior and inferior pubic rami. No other acute pelvic fractures. Stable left hip osteoarthritis. IMPRESSION: 1. Acute periprosthetic proximal right femur fracture, with fracture line extending through the greater trochanter. 2. Acute minimally displaced right superior and inferior pubic rami fractures. 3. Left hip osteoarthritis. Electronically Signed   By: Sharlet Salina M.D.   On: 09/13/2022 21:18   DG Wrist Complete Right  Result Date: 09/13/2022 CLINICAL DATA:  Trauma to the right wrist. EXAM: RIGHT WRIST - COMPLETE 3+ VIEW COMPARISON:  None Available. FINDINGS: Displaced fractures of the distal radius and ulna with dorsal  angulation and displacement of the distal fracture fragments. The distal radial fracture appears to extend into the articular surface  of the wrist. There is also fracture of the ulnar styloid. No dislocation. There is soft tissue swelling of the wrist. No opaque foreign object/catheterization. IMPRESSION: Displaced fractures of the distal radius and ulna. Electronically Signed   By: Elgie Collard M.D.   On: 09/13/2022 21:17   CT Head Wo Contrast  Result Date: 09/10/2022 CLINICAL DATA:  Provided history: Head trauma, minor. Fall (hitting left side of head on concrete). Nausea/vomiting. Unsteady gait. EXAM: CT HEAD WITHOUT CONTRAST CT CERVICAL SPINE WITHOUT CONTRAST TECHNIQUE: Multidetector CT imaging of the head and cervical spine was performed following the standard protocol without intravenous contrast. Multiplanar CT image reconstructions of the cervical spine were also generated. RADIATION DOSE REDUCTION: This exam was performed according to the departmental dose-optimization program which includes automated exposure control, adjustment of the mA and/or kV according to patient size and/or use of iterative reconstruction technique. COMPARISON:  Head CT 06/10/2021.  Cervical spine CT 03/27/2021. FINDINGS: CT HEAD FINDINGS Brain: Generalized cerebral atrophy. Patchy and ill-defined hypoattenuation within the cerebral white matter, nonspecific but compatible with mild chronic small vessel ischemic disease. There is no acute intracranial hemorrhage. No demarcated cortical infarct. No extra-axial fluid collection. No evidence of an intracranial mass. No midline shift. Vascular: No hyperdense vessel.  Atherosclerotic calcifications. Skull: No calvarial fracture or aggressive osseous lesion. Sinuses/Orbits: No mass or acute finding within the imaged orbits. Postsurgical appearance of the paranasal sinuses. Minimal mucosal thickening within the ethmoid and left maxillary sinuses. Other: Small focus of increased  density within the right forehead soft tissues, likely reflecting sequelae of prior hematoma/laceration at this site. Redemonstrated chronic nasal bone fracture deformity. CT CERVICAL SPINE FINDINGS Alignment: Slight C6-C7 and C7-T1 grade 1 anterolisthesis. Skull base and vertebrae: The basion-dental and atlanto-dental intervals are maintained.No evidence of acute fracture to the cervical spine. Facet ankylosis on the left at C2-C3. Soft tissues and spinal canal: Subcentimeter nodule within the left thyroid lobe not meeting consensus criteria for ultrasound follow-up based on size. No follow-up imaging is recommended. Reference: J Am Coll Radiol. 2015 Feb;12(2): 143-50. Disc levels: Cervical spondylosis with multilevel disc space narrowing, disc bulges/central disc protrusions, endplate spurring, uncovertebral hypertrophy and facet arthrosis. No appreciable high-grade spinal canal stenosis. Uncovertebral hypertrophy results in bony neural foraminal narrowing on the right at C5-C6. Degenerative changes also present at the C1-C2 articulation. Upper chest: No consolidation within the imaged lung apices. No visible pneumothorax. IMPRESSION: CT head: 1. No evidence of an acute intracranial abnormality. 2. Parenchymal atrophy and chronic small vessel ischemic disease. CT cervical spine: 1. No evidence of an acute cervical spine fracture. 2. Mild grade 1 anterolisthesis at C6-C7 and C7-T1. 3. Cervical spondylosis as described. 4. Facet ankylosis on the left at C2-C3. Electronically Signed   By: Jackey Loge D.O.   On: 09/10/2022 14:22   CT Cervical Spine Wo Contrast  Result Date: 09/10/2022 CLINICAL DATA:  Provided history: Head trauma, minor. Fall (hitting left side of head on concrete). Nausea/vomiting. Unsteady gait. EXAM: CT HEAD WITHOUT CONTRAST CT CERVICAL SPINE WITHOUT CONTRAST TECHNIQUE: Multidetector CT imaging of the head and cervical spine was performed following the standard protocol without intravenous  contrast. Multiplanar CT image reconstructions of the cervical spine were also generated. RADIATION DOSE REDUCTION: This exam was performed according to the departmental dose-optimization program which includes automated exposure control, adjustment of the mA and/or kV according to patient size and/or use of iterative reconstruction technique. COMPARISON:  Head CT 06/10/2021.  Cervical spine CT 03/27/2021. FINDINGS: CT HEAD FINDINGS Brain:  Generalized cerebral atrophy. Patchy and ill-defined hypoattenuation within the cerebral white matter, nonspecific but compatible with mild chronic small vessel ischemic disease. There is no acute intracranial hemorrhage. No demarcated cortical infarct. No extra-axial fluid collection. No evidence of an intracranial mass. No midline shift. Vascular: No hyperdense vessel.  Atherosclerotic calcifications. Skull: No calvarial fracture or aggressive osseous lesion. Sinuses/Orbits: No mass or acute finding within the imaged orbits. Postsurgical appearance of the paranasal sinuses. Minimal mucosal thickening within the ethmoid and left maxillary sinuses. Other: Small focus of increased density within the right forehead soft tissues, likely reflecting sequelae of prior hematoma/laceration at this site. Redemonstrated chronic nasal bone fracture deformity. CT CERVICAL SPINE FINDINGS Alignment: Slight C6-C7 and C7-T1 grade 1 anterolisthesis. Skull base and vertebrae: The basion-dental and atlanto-dental intervals are maintained.No evidence of acute fracture to the cervical spine. Facet ankylosis on the left at C2-C3. Soft tissues and spinal canal: Subcentimeter nodule within the left thyroid lobe not meeting consensus criteria for ultrasound follow-up based on size. No follow-up imaging is recommended. Reference: J Am Coll Radiol. 2015 Feb;12(2): 143-50. Disc levels: Cervical spondylosis with multilevel disc space narrowing, disc bulges/central disc protrusions, endplate spurring,  uncovertebral hypertrophy and facet arthrosis. No appreciable high-grade spinal canal stenosis. Uncovertebral hypertrophy results in bony neural foraminal narrowing on the right at C5-C6. Degenerative changes also present at the C1-C2 articulation. Upper chest: No consolidation within the imaged lung apices. No visible pneumothorax. IMPRESSION: CT head: 1. No evidence of an acute intracranial abnormality. 2. Parenchymal atrophy and chronic small vessel ischemic disease. CT cervical spine: 1. No evidence of an acute cervical spine fracture. 2. Mild grade 1 anterolisthesis at C6-C7 and C7-T1. 3. Cervical spondylosis as described. 4. Facet ankylosis on the left at C2-C3. Electronically Signed   By: Jackey Loge D.O.   On: 09/10/2022 14:22    Assessment/Plan Hyponatremia Euvolemic on exam. Patient has minimal salt intake. Discussed with patient 3d nacl and recheck labs on Monday. If persistent or no improvement with supplementation, will consider additional work up.   Family/ staff Communication: nursing  Labs/tests ordered:  none

## 2022-10-11 LAB — BASIC METABOLIC PANEL WITH GFR: Sodium: 134 — AB (ref 137–147)

## 2022-10-14 ENCOUNTER — Ambulatory Visit (INDEPENDENT_AMBULATORY_CARE_PROVIDER_SITE_OTHER): Payer: Medicare Other

## 2022-10-14 DIAGNOSIS — I495 Sick sinus syndrome: Secondary | ICD-10-CM

## 2022-10-15 LAB — CUP PACEART REMOTE DEVICE CHECK
Battery Impedance: 2483 Ohm
Battery Remaining Longevity: 27 mo
Battery Voltage: 2.74 V
Brady Statistic AP VP Percent: 18 %
Brady Statistic AP VS Percent: 71 %
Brady Statistic AS VP Percent: 4 %
Brady Statistic AS VS Percent: 7 %
Date Time Interrogation Session: 20240926160055
Implantable Lead Connection Status: 753985
Implantable Lead Connection Status: 753985
Implantable Lead Implant Date: 20051130
Implantable Lead Implant Date: 20051130
Implantable Lead Location: 753859
Implantable Lead Location: 753860
Implantable Lead Model: 5076
Implantable Pulse Generator Implant Date: 20140901
Lead Channel Impedance Value: 510 Ohm
Lead Channel Impedance Value: 589 Ohm
Lead Channel Pacing Threshold Amplitude: 0.75 V
Lead Channel Pacing Threshold Amplitude: 0.875 V
Lead Channel Pacing Threshold Pulse Width: 0.4 ms
Lead Channel Pacing Threshold Pulse Width: 0.4 ms
Lead Channel Setting Pacing Amplitude: 2 V
Lead Channel Setting Pacing Amplitude: 2.5 V
Lead Channel Setting Pacing Pulse Width: 0.4 ms
Lead Channel Setting Sensing Sensitivity: 4 mV
Zone Setting Status: 755011
Zone Setting Status: 755011

## 2022-10-18 ENCOUNTER — Encounter: Payer: Self-pay | Admitting: Student

## 2022-10-18 ENCOUNTER — Non-Acute Institutional Stay (SKILLED_NURSING_FACILITY): Payer: Medicare Other | Admitting: Student

## 2022-10-18 DIAGNOSIS — I5032 Chronic diastolic (congestive) heart failure: Secondary | ICD-10-CM

## 2022-10-18 DIAGNOSIS — I480 Paroxysmal atrial fibrillation: Secondary | ICD-10-CM

## 2022-10-18 DIAGNOSIS — S72001D Fracture of unspecified part of neck of right femur, subsequent encounter for closed fracture with routine healing: Secondary | ICD-10-CM

## 2022-10-18 DIAGNOSIS — T8484XS Pain due to internal orthopedic prosthetic devices, implants and grafts, sequela: Secondary | ICD-10-CM

## 2022-10-18 DIAGNOSIS — S5291XD Unspecified fracture of right forearm, subsequent encounter for closed fracture with routine healing: Secondary | ICD-10-CM

## 2022-10-18 DIAGNOSIS — S52201D Unspecified fracture of shaft of right ulna, subsequent encounter for closed fracture with routine healing: Secondary | ICD-10-CM

## 2022-10-18 DIAGNOSIS — I10 Essential (primary) hypertension: Secondary | ICD-10-CM

## 2022-10-18 DIAGNOSIS — I951 Orthostatic hypotension: Secondary | ICD-10-CM

## 2022-10-18 DIAGNOSIS — Z96659 Presence of unspecified artificial knee joint: Secondary | ICD-10-CM

## 2022-10-18 NOTE — Progress Notes (Unsigned)
Location:  Other [facility]) Nursing Home Room Number: [facility] [address]) Place of Service:  SNF [phone]) Provider:  Dr.  Loretha Stapler, MD  Patient Care Team: Lauro Regulus, MD as PCP - General (Unknown Physician Specialty) Duke Salvia, MD as PCP - Cardiology (Cardiology) Duke Salvia, MD as PCP - Electrophysiology (Cardiology)  Extended Emergency Contact Information Primary Emergency Contact: [facility] Mobile Phone: [phone] Relation: Spouse Secondary Emergency Contact: Buchanan,Ellen Mobile Phone: [phone] Relation: Daughter  Code Status:  DNR Goals of care: Advanced Directive information    10/18/2022   11:08 AM  Advanced Directives  Does Patient Have a Medical Advance Directive? Yes  Type of Advance Directive Out of facility DNR (pink MOST or yellow form)  Would patient like information on creating a medical advance directive? No - Patient declined  Pre-existing out of facility DNR order (yellow form or pink MOST form) Yellow form placed in chart (order not valid for inpatient use)     Chief Complaint  Patient presents with   Medical Management of Chronic Issues    Routine    Immunizations    DTAP, Shingrix, Influenza and Covid    Quality Metric Gaps    Dexa Scan and Medicare Annual Wellness Visit    HPI:  Pt is a 89 y.o. female seen today for medical management of chronic diseases.     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 Rosita Kea during a back surgery in approx 2019.   Hypertension    Long term current use of anticoagulant    a.) dose reduced apixaban   MRSA nasal colonization 09/17/2016    Pacemaker 12/06/2003   Medtronic Impulse [id number]   Pacemaker 2015   PAF (paroxysmal atrial fibrillation) (HCC)    a.) CHA2DS2-VASc = 8 (age x 2, sex, CHF, HTN, CVA x 2, aortic plaque). b.) rate/rhythm maintained on oral amiodarone + metoprolol succinate; chronically anticoagulated with dose reduced apixaban   Sick sinus syndrome (HCC)    Sinoatrial node dysfunction (HCC)    Stroke (HCC)    Vaginal intraepithelial neoplasia    VAIN (vaginal intraepithelial neoplasia)    Past Surgical History:  Procedure Laterality Date   APPENDECTOMY     BICEPT TENODESIS Right 04/09/2021   Procedure: BICEPS TENODESIS;  Surgeon: Signa Kell, MD;  Location: [facility] ORS;  Service: Orthopedics;  Laterality: Right;   BREAST BIOPSY Left 05/12/2001   moderate epithelial hyperplasia without atypia and fibrocystic changes   COLONOSCOPY     HERNIA REPAIR     lower abd   IMAGE GUIDED SINUS SURGERY  2015   INSERT / REPLACE / REMOVE PACEMAKER  12/06/2003   Medtronic impulse [id number]   JOINT REPLACEMENT Right    knee   KYPHOPLASTY N/A 06/23/2017   Procedure: [id number];  Surgeon: Kennedy Bucker, MD;  Location: [facility] ORS;  Service: Orthopedics;  Laterality: N/A;   LEEP  1999   OPEN REDUCTION INTERNAL FIXATION (ORIF) DISTAL RADIAL FRACTURE Right 09/15/2022   Procedure: OPEN REDUCTION INTERNAL FIXATION (ORIF) DISTAL RADIUS FRACTURE;  Surgeon: Christena Flake, MD;  Location: [facility] ORS;  Service: Orthopedics;  Laterality: Right;  ORIF HIP FRACTURE Right 02/2016   PACEMAKER GENERATOR CHANGE N/A 09/18/2012   Procedure: PACEMAKER GENERATOR CHANGE;  Surgeon: Duke Salvia, MD;  Location: [facility] CATH LAB;  Service: Cardiovascular;  Laterality: N/A;   REVERSE SHOULDER ARTHROPLASTY Right 04/09/2021   Procedure: Right reverse shoulder arthroplasty for fracture;  Surgeon: Signa Kell, MD;  Location: [facility] ORS;  Service: Orthopedics;  Laterality: Right;   TONSILLECTOMY     TOTAL KNEE ARTHROPLASTY     TOTAL KNEE REVISION Right  09/23/2016   Procedure: TOTAL KNEE REVISION-POLYETHYLENE EXCHANGE;  Surgeon: Kennedy Bucker, MD;  Location: [facility] ORS;  Service: Orthopedics;  Laterality: Right;   VAGINAL HYSTERECTOMY      Allergies  Allergen Reactions   Latex Other (See Comments)    blisters   Other Other (See Comments)    Blisters/ paper tape is Education administrator paper tape is Education administrator paper tape is Musician paper tape is Ok   Tape Other (See Comments)    Blisters/ paper tape is Education administrator paper tape is Ok   Xarelto [Rivaroxaban] Other (See Comments)    Gums bleeding and too much bruising   Nickel Rash and Other (See Comments)    Outpatient Encounter Medications as of 10/18/2022  Medication Sig   acetaminophen (TYLENOL) 500 MG tablet Take 500 mg by mouth every 8 (eight) hours as needed for mild pain or headache. Give 2 tablet by mouth twice daily   amiodarone (PACERONE) 200 MG tablet Take 1 tablet by mouth 5 days a week   buPROPion (WELLBUTRIN XL) 150 MG 24 hr tablet Take 150 mg by mouth daily.   ELIQUIS 2.5 MG TABS tablet TAKE 1 TABLET TWICE A DAY   fluticasone (FLONASE) 50 MCG/ACT nasal spray Place 2 sprays into both nostrils daily.   HYDROcodone-acetaminophen (NORCO/VICODIN) 5-325 MG tablet Take 1-2 tablets by mouth every 6 (six) hours as needed for moderate pain.   ibandronate (BONIVA) 150 MG tablet Take 150 mg by mouth every 30 (thirty) days. Take in the morning with a full glass of water, on an empty stomach, and do not take anything else by mouth or lie down for the next 30 min.   midodrine (PROAMATINE) 2.5 MG tablet Take 2.5 mg by mouth 3 (three) times daily with meals.   Multiple Vitamins-Minerals (SENIOR MULTIVITAMIN PLUS PO) Take 1 tablet by mouth daily.   polyethylene glycol (MIRALAX / GLYCOLAX) 17 g packet Take 17 g by mouth daily as needed.   propranolol ER (INDERAL LA) 80 MG 24 hr capsule Take 1 capsule (80 mg total) by mouth daily.   SODIUM CHLORIDE PO Take 500 mg by mouth 2 (two) times  daily.   No facility-administered encounter medications on file as of 10/18/2022.    Review of Systems  Immunization History  Administered Date(s) Administered   Influenza Inj Mdck Quad Pf 11/08/2018   Influenza, High Dose Seasonal PF 09/10/2016, 11/30/2017   Influenza-Unspecified 11/20/2012, 10/18/2013, 11/27/2014, 10/24/2015, 10/18/2021   Moderna Sars-Covid-2 Vaccination 02/02/2019, 03/02/2019, 11/24/2019   Pneumococcal Conjugate-13 09/02/2016   Pneumococcal Polysaccharide-23 05/19/2011   Pertinent  Health Maintenance Due  Topic Date Due   DEXA SCAN  Never done   INFLUENZA VACCINE  08/19/2022      12/31/2020    9:45 AM 03/27/2021    5:29 PM 04/09/2021   12:00 PM 04/09/2021   10:37 PM 04/10/2021   10:00 AM  Fall Risk  (RETIRED) Patient Fall Risk Level Moderate fall risk Low fall risk High fall risk High  fall risk High fall risk   Functional Status Survey:    Vitals:   10/18/22 1103  BP: 134/67  Pulse: 74  Resp: 14  Temp: (!) 97.3 F (36.3 C)  SpO2: 96%  Weight: 116 lb 6.4 oz (52.8 kg)  Height: 5\' 6"  (1.676 m)   Body mass index is 18.79 kg/m. Physical Exam  Labs reviewed: Recent Labs    09/14/22 0349 09/16/22 0422 09/18/22 0509 10/07/22 0000 10/11/22 0000  NA 136 134* 134* 130* 134*  K 4.8 4.3 4.2 4.5  --   CL 102 107 102 95*  --   CO2 25 24 26  24*  --   GLUCOSE 154* 165* 98  --   --   BUN 35* 16 21 14   --   CREATININE 0.94 0.65 0.65 0.8  --   CALCIUM 8.4* 7.4* 7.9* 9.1  --    Recent Labs    03/23/22 1204 07/12/22 0850  AST 42* 30  ALT 39 25  ALKPHOS 55 57  BILITOT 0.6 0.8  PROT 7.4 7.7  ALBUMIN 4.1 3.9   Recent Labs    09/16/22 0422 09/17/22 0615 09/18/22 0509 10/07/22 0000  WBC 8.2 12.5* 9.1 7.5  NEUTROABS 7.1  --  5.6 4,883.00  HGB 8.5* 9.0* 9.1* 11.3*  HCT 25.6* 27.2* 27.2* 35*  MCV 95.2 96.8 94.8  --   PLT 90* 126* 134* 301   Lab Results  Component Value Date   TSH 1.812 03/23/2022   Lab Results  Component Value Date    HGBA1C 6.7 (H) 02/10/2018   Lab Results  Component Value Date   CHOL 157 02/10/2018   HDL 62 02/10/2018   LDLCALC 83 02/10/2018   TRIG 61 02/10/2018   CHOLHDL 2.5 02/10/2018    Significant Diagnostic Results in last 30 days:  CUP PACEART REMOTE DEVICE CHECK  Result Date: 10/15/2022 Scheduled remote reviewed. Normal device function.  1 AHR on 09/26/22, 1 hr 12 min, V rate controlled, on Eliquis per Epic. 2 VHR on 09/15/22, 5 sec duration, 150 bpm. Next remote 91 days. MC, CVRS   Assessment/Plan There are no diagnoses linked to this encounter.   Family/ staff Communication: ***  Labs/tests ordered:  ***

## 2022-10-20 ENCOUNTER — Encounter: Payer: Self-pay | Admitting: Student

## 2022-10-20 ENCOUNTER — Non-Acute Institutional Stay (SKILLED_NURSING_FACILITY): Payer: Self-pay | Admitting: Student

## 2022-10-20 DIAGNOSIS — S72001D Fracture of unspecified part of neck of right femur, subsequent encounter for closed fracture with routine healing: Secondary | ICD-10-CM

## 2022-10-20 DIAGNOSIS — I951 Orthostatic hypotension: Secondary | ICD-10-CM

## 2022-10-20 DIAGNOSIS — S31801D Laceration without foreign body of unspecified buttock, subsequent encounter: Secondary | ICD-10-CM

## 2022-10-20 DIAGNOSIS — S5291XD Unspecified fracture of right forearm, subsequent encounter for closed fracture with routine healing: Secondary | ICD-10-CM

## 2022-10-20 DIAGNOSIS — E871 Hypo-osmolality and hyponatremia: Secondary | ICD-10-CM

## 2022-10-20 DIAGNOSIS — S52201D Unspecified fracture of shaft of right ulna, subsequent encounter for closed fracture with routine healing: Secondary | ICD-10-CM

## 2022-10-20 DIAGNOSIS — I5032 Chronic diastolic (congestive) heart failure: Secondary | ICD-10-CM

## 2022-10-20 DIAGNOSIS — I10 Essential (primary) hypertension: Secondary | ICD-10-CM

## 2022-10-20 DIAGNOSIS — I480 Paroxysmal atrial fibrillation: Secondary | ICD-10-CM

## 2022-10-20 NOTE — Progress Notes (Signed)
Location:  Other [facility].  Nursing Home Room Number: [facility] [address] Place of Service:  SNF [phone]) Provider:  Dr. Earnestine Mealing  PCP: Lauro Regulus, MD  Patient Care Team: Lauro Regulus, MD as PCP - General (Unknown Physician Specialty) Duke Salvia, MD as PCP - Cardiology (Cardiology) Duke Salvia, MD as PCP - Electrophysiology (Cardiology)  Extended Emergency Contact Information Primary Emergency Contact: [facility] Mobile Phone: [phone] Relation: Spouse Secondary Emergency Contact: Buchanan,Ellen Mobile Phone: [phone] Relation: Daughter  Code Status:  DNR Goals of care: Advanced Directive information    10/20/2022    1:39 PM  Advanced Directives  Does Patient Have a Medical Advance Directive? Yes  Type of Advance Directive Out of facility DNR (pink MOST or yellow form)  Does patient want to make changes to medical advance directive? No - Patient declined     Chief Complaint  Patient presents with   Discharge Note    Discharge.     HPI:  Pt is a 89 y.o. female seen today for Discharge.   Patient has had physical therapy with improvement. She is weight bearing well, and pain is well-controlled. Appetite has not fully returned, but sh eis looking forward to having her spouse's cooking again. She has had normal bowel movements every other day at this itme.    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 Rosita Kea during a back surgery in approx 2019.   Hypertension    Long term current use of anticoagulant    a.) dose reduced apixaban   MRSA nasal colonization 09/17/2016   Pacemaker 12/06/2003   Medtronic Impulse DTDR01    Pacemaker 2015   PAF (paroxysmal atrial fibrillation) (HCC)    a.) CHA2DS2-VASc = 8 (age x 2, sex, CHF, HTN, CVA x 2, aortic plaque). b.) rate/rhythm maintained on oral amiodarone + metoprolol succinate; chronically anticoagulated with dose reduced apixaban   Sick sinus syndrome (HCC)    Sinoatrial node dysfunction (HCC)    Stroke (HCC)    Vaginal intraepithelial neoplasia    VAIN (vaginal intraepithelial neoplasia)    Past Surgical History:  Procedure Laterality Date   APPENDECTOMY     BICEPT TENODESIS Right 04/09/2021   Procedure: BICEPS TENODESIS;  Surgeon: Signa Kell, MD;  Location: [facility] ORS;  Service: Orthopedics;  Laterality: Right;   BREAST BIOPSY Left 05/12/2001   moderate epithelial hyperplasia without atypia and fibrocystic changes   COLONOSCOPY     HERNIA REPAIR     lower abd   IMAGE GUIDED SINUS SURGERY  2015   INSERT / REPLACE / REMOVE PACEMAKER  12/06/2003   Medtronic impulse [id number]   JOINT REPLACEMENT Right    knee   KYPHOPLASTY N/A 06/23/2017   Procedure: [id number];  Surgeon: Kennedy Bucker, MD;  Location: [facility] ORS;  Service: Orthopedics;  Laterality: N/A;   LEEP  1999   OPEN REDUCTION INTERNAL FIXATION (ORIF) DISTAL RADIAL FRACTURE Right 09/15/2022   Procedure: OPEN REDUCTION INTERNAL FIXATION (ORIF) DISTAL RADIUS FRACTURE;  Surgeon: Christena Flake, MD;  Location: [facility] ORS;  Service: Orthopedics;  Laterality: Right;   ORIF HIP FRACTURE Right 02/2016   PACEMAKER GENERATOR CHANGE  N/A 09/18/2012   Procedure: PACEMAKER GENERATOR CHANGE;  Surgeon: Duke Salvia, MD;  Location: [facility] CATH LAB;  Service: Cardiovascular;  Laterality: N/A;   REVERSE SHOULDER ARTHROPLASTY Right 04/09/2021   Procedure: Right reverse shoulder arthroplasty for fracture;  Surgeon: Signa Kell, MD;  Location: [facility] ORS;  Service: Orthopedics;  Laterality: Right;   TONSILLECTOMY     TOTAL KNEE ARTHROPLASTY     TOTAL KNEE REVISION Right 09/23/2016   Procedure: TOTAL KNEE REVISION-POLYETHYLENE  EXCHANGE;  Surgeon: Kennedy Bucker, MD;  Location: [facility] ORS;  Service: Orthopedics;  Laterality: Right;   VAGINAL HYSTERECTOMY      Allergies  Allergen Reactions   Latex Other (See Comments)    blisters   Other Other (See Comments)    Blisters/ paper tape is Education administrator paper tape is Education administrator paper tape is Musician paper tape is Ok   Tape Other (See Comments)    Blisters/ paper tape is Education administrator paper tape is Ok   Xarelto [Rivaroxaban] Other (See Comments)    Gums bleeding and too much bruising   Nickel Rash and Other (See Comments)    Outpatient Encounter Medications as of 10/20/2022  Medication Sig   acetaminophen (TYLENOL) 500 MG tablet Take 500 mg by mouth every 8 (eight) hours as needed for mild pain or headache. Give 2 tablet by mouth twice daily   amiodarone (PACERONE) 200 MG tablet Take 1 tablet by mouth 5 days a week   buPROPion (WELLBUTRIN XL) 150 MG 24 hr tablet Take 150 mg by mouth daily.   Calcium-Vitamin D (CALTRATE 600 PLUS-VIT D PO) Take 1 tablet by mouth daily.   ELIQUIS 2.5 MG TABS tablet TAKE 1 TABLET TWICE A DAY   fluticasone (FLONASE) 50 MCG/ACT nasal spray Place 2 sprays into both nostrils daily.   HYDROcodone-acetaminophen (NORCO/VICODIN) 5-325 MG tablet Take 1-2 tablets by mouth every 6 (six) hours as needed for moderate pain. (Patient taking differently: Take 1 tablet by mouth every 6 (six) hours as needed for moderate pain.)   midodrine (PROAMATINE) 2.5 MG tablet Take 2.5 mg by mouth 3 (three) times daily with meals.   Multiple Vitamins-Minerals (SENIOR MULTIVITAMIN PLUS PO) Take 1 tablet by mouth daily.   polyethylene glycol (MIRALAX / GLYCOLAX) 17 g packet Take 17 g by mouth daily as needed.   propranolol ER (INDERAL LA) 80 MG 24 hr capsule Take 1 capsule (80 mg total) by mouth daily.   risedronate (ACTONEL) 150 MG tablet Take 150 mg by mouth every 30 (thirty) days. with water on empty stomach, nothing by mouth or lie down for next 30 minutes.    [DISCONTINUED] ibandronate (BONIVA) 150 MG tablet Take 150 mg by mouth every 30 (thirty) days. Take in the morning with a full glass of water, on an empty stomach, and do not take anything else by mouth or lie down for the next 30 min.   [DISCONTINUED] SODIUM CHLORIDE PO Take 500 mg by mouth 2 (two) times daily.   No facility-administered encounter medications on file as of 10/20/2022.    Review of Systems  Immunization History  Administered Date(s) Administered   Influenza Inj Mdck Quad Pf 11/08/2018   Influenza, High Dose Seasonal PF 09/10/2016, 11/30/2017   Influenza-Unspecified 11/20/2012, 10/18/2013, 11/27/2014, 10/24/2015, 10/18/2021   Moderna Sars-Covid-2 Vaccination 02/02/2019, 03/02/2019, 11/24/2019   Pneumococcal Conjugate-13 09/02/2016   Pneumococcal Polysaccharide-23 05/19/2011   Pertinent  Health Maintenance Due  Topic Date Due   DEXA SCAN  Never done  INFLUENZA VACCINE  08/19/2022      12/31/2020    9:45 AM 03/27/2021    5:29 PM 04/09/2021   12:00 PM 04/09/2021   10:37 PM 04/10/2021   10:00 AM  Fall Risk  (RETIRED) Patient Fall Risk Level Moderate fall risk Low fall risk High fall risk High fall risk High fall risk   Functional Status Survey:    Vitals:   10/20/22 1334 10/20/22 1341  BP: (!) 150/85 (!) 155/85  Pulse: 61   Resp: 14   Temp: 97.6 F (36.4 C)   SpO2: 96%   Weight: 115 lb (52.2 kg)   Height: 5\' 6"  (1.676 m)    Body mass index is 18.56 kg/m. Physical Exam Constitutional:      Appearance: Normal appearance.  Cardiovascular:     Rate and Rhythm: Normal rate and regular rhythm.     Pulses: Normal pulses.  Pulmonary:     Effort: Pulmonary effort is normal.     Breath sounds: Normal breath sounds.  Skin:    Comments: Right glute with healing skin tear and sacral skin tear present with slough in the wound bed.   Neurological:     Mental Status: She is alert and oriented to person, place, and time.     Labs reviewed: Recent Labs     09/14/22 0349 09/16/22 0422 09/18/22 0509 10/07/22 0000 10/11/22 0000  NA 136 134* 134* 130* 134*  K 4.8 4.3 4.2 4.5  --   CL 102 107 102 95*  --   CO2 25 24 26  24*  --   GLUCOSE 154* 165* 98  --   --   BUN 35* 16 21 14   --   CREATININE 0.94 0.65 0.65 0.8  --   CALCIUM 8.4* 7.4* 7.9* 9.1  --    Recent Labs    03/23/22 1204 07/12/22 0850  AST 42* 30  ALT 39 25  ALKPHOS 55 57  BILITOT 0.6 0.8  PROT 7.4 7.7  ALBUMIN 4.1 3.9   Recent Labs    09/16/22 0422 09/17/22 0615 09/18/22 0509 10/07/22 0000  WBC 8.2 12.5* 9.1 7.5  NEUTROABS 7.1  --  5.6 4,883.00  HGB 8.5* 9.0* 9.1* 11.3*  HCT 25.6* 27.2* 27.2* 35*  MCV 95.2 96.8 94.8  --   PLT 90* 126* 134* 301   Lab Results  Component Value Date   TSH 1.812 03/23/2022   Lab Results  Component Value Date   HGBA1C 6.7 (H) 02/10/2018   Lab Results  Component Value Date   CHOL 157 02/10/2018   HDL 62 02/10/2018   LDLCALC 83 02/10/2018   TRIG 61 02/10/2018   CHOLHDL 2.5 02/10/2018    Significant Diagnostic Results in last 30 days:  CUP PACEART REMOTE DEVICE CHECK  Result Date: 10/15/2022 Scheduled remote reviewed. Normal device function.  1 AHR on 09/26/22, 1 hr 12 min, V rate controlled, on Eliquis per Epic. 2 VHR on 09/15/22, 5 sec duration, 150 bpm. Next remote 91 days. MC, CVRS   Assessment/Plan Closed fracture of right radius and ulna with routine healing, subsequent encounter  Closed fracture of right hip with routine healing, subsequent encounter  Essential hypertension  Orthostatic hypotension  Hyponatremia  Chronic diastolic heart failure (HCC)  Paroxysmal atrial fibrillation (HCC)  Tear of skin of buttock, unspecified laterality, subsequent encounter [S31.801D] Patient admitted for fracture of wrist and hip with routine healing. BP labile requiring midodrine to help with low blood pressures with blood pressure slightly above goal  today. F/u with PCP in 1 week to follow up on blood pressure. Sodium  slighly below normal range. Appears euvolemic at this time. Rate controlled. Slough present in wound of the buttock with 50% of necrotic tissue successfully debrided with sharp dissection. Well tolerated with minimal pain. Hemostatic.   Family/ staff Communication: nursing  Labs/tests ordered:  none

## 2022-10-25 ENCOUNTER — Encounter: Payer: Self-pay | Admitting: Internal Medicine

## 2022-10-25 NOTE — Telephone Encounter (Signed)
yes

## 2022-10-26 ENCOUNTER — Telehealth: Payer: Medicare Other

## 2022-10-26 ENCOUNTER — Other Ambulatory Visit: Payer: Self-pay

## 2022-10-26 MED ORDER — MIDODRINE HCL 2.5 MG PO TABS: 2.5000 mg | ORAL_TABLET | Freq: Three times a day (TID) | ORAL | 1 refills | Status: DC

## 2022-10-26 MED ORDER — PROPRANOLOL HCL ER 80 MG PO CP24: 80.0000 mg | ORAL_CAPSULE | Freq: Every day | ORAL | 1 refills | Status: DC

## 2022-10-26 NOTE — Telephone Encounter (Signed)
Patient's husband (called) office with concerns of her propranolol ER 80 mg. He reports that she is missing the medication and he needs to know what happened to the medication. Jillyn Hidden was advised he will have to reach out to the nurse/ medtech at [facility] due to the office is [facility] and we do not handle the patients medication and he goes to say that was not helping him at all and hung up the phone.

## 2022-10-27 MED ORDER — MIDODRINE HCL 2.5 MG PO TABS: 2.5000 mg | ORAL_TABLET | Freq: Three times a day (TID) | ORAL | 1 refills | Status: DC

## 2022-10-27 MED ORDER — PROPRANOLOL HCL ER 80 MG PO CP24: 80.0000 mg | ORAL_CAPSULE | Freq: Every day | ORAL | 1 refills | Status: DC

## 2022-10-27 MED ORDER — AMIODARONE HCL 200 MG PO TABS: ORAL_TABLET | ORAL | 3 refills | Status: DC

## 2022-10-27 NOTE — Addendum Note (Signed)
Addended by: Darene Lamer T on: 10/27/2022 12:56 PM   Modules accepted: Orders

## 2022-10-29 NOTE — Progress Notes (Signed)
Remote pacemaker transmission.   

## 2022-11-01 ENCOUNTER — Other Ambulatory Visit: Payer: Self-pay

## 2022-11-01 MED ORDER — MIDODRINE HCL 2.5 MG PO TABS: 2.5000 mg | ORAL_TABLET | Freq: Three times a day (TID) | ORAL | 2 refills | Status: DC

## 2022-11-08 ENCOUNTER — Ambulatory Visit: Payer: Medicare Other | Attending: Internal Medicine | Admitting: Internal Medicine

## 2022-11-08 ENCOUNTER — Encounter: Payer: Self-pay | Admitting: Internal Medicine

## 2022-11-08 VITALS — BP 128/82 | HR 70 | Ht 66.5 in | Wt 115.0 lb

## 2022-11-08 DIAGNOSIS — I480 Paroxysmal atrial fibrillation: Secondary | ICD-10-CM | POA: Diagnosis not present

## 2022-11-08 DIAGNOSIS — Z950 Presence of cardiac pacemaker: Secondary | ICD-10-CM | POA: Diagnosis not present

## 2022-11-08 LAB — CUP PACEART INCLINIC DEVICE CHECK
Battery Impedance: 2614 Ohm
Battery Remaining Longevity: 25 mo
Battery Voltage: 2.73 V
Brady Statistic AP VP Percent: 16 %
Brady Statistic AP VS Percent: 72 %
Brady Statistic AS VP Percent: 4 %
Brady Statistic AS VS Percent: 8 %
Date Time Interrogation Session: 20241021124351
Implantable Lead Connection Status: 753985
Implantable Lead Connection Status: 753985
Implantable Lead Implant Date: 20051130
Implantable Lead Implant Date: 20051130
Implantable Lead Location: 753859
Implantable Lead Location: 753860
Implantable Lead Model: 5076
Implantable Pulse Generator Implant Date: 20140901
Lead Channel Impedance Value: 484 Ohm
Lead Channel Impedance Value: 545 Ohm
Lead Channel Pacing Threshold Amplitude: 0.75 V
Lead Channel Pacing Threshold Amplitude: 0.875 V
Lead Channel Pacing Threshold Amplitude: 1 V
Lead Channel Pacing Threshold Amplitude: 1 V
Lead Channel Pacing Threshold Pulse Width: 0.4 ms
Lead Channel Pacing Threshold Pulse Width: 0.4 ms
Lead Channel Pacing Threshold Pulse Width: 0.4 ms
Lead Channel Pacing Threshold Pulse Width: 0.4 ms
Lead Channel Sensing Intrinsic Amplitude: 11.2 mV
Lead Channel Setting Pacing Amplitude: 2 V
Lead Channel Setting Pacing Amplitude: 2.5 V
Lead Channel Setting Pacing Pulse Width: 0.4 ms
Lead Channel Setting Sensing Sensitivity: 4 mV
Zone Setting Status: 755011
Zone Setting Status: 755011

## 2022-11-08 NOTE — Progress Notes (Signed)
Patient Care Team: Lauro Regulus, MD as PCP - General (Unknown Physician Specialty) Duke Salvia, MD as PCP - Cardiology (Cardiology) Duke Salvia, MD as PCP - Electrophysiology (Cardiology)   HPI  Alejandra Wiley is a 89 y.o. female seen in followup for palpitations associated with atrial arrhythmias as well as bradycardia requiring backup bradycardia pacing. She previously took flecainide.  Resulted in prolonged AV nodal conduction and ventricular pacing.  Deterioration of LV function; flecainide discontinued with subsequent normalization of LV function.  Ended up on amiodarone 12/22 following hospitalization with AFib RVF and acute CHF   Hypertension and orthostasic LH and recurrent falls but without demonstrable orthostatic hypotension  1/20 Hospitalized with stroke- R sided weakness.  INR subtherapeutic 1.52.  >>imaging  CT  No acute stroke.  Carotids neg.  Coumadin d/c and apixoban started ( 2.5 mg bid -wt 55 kg;age->82)  Interval fall (attributed to mechanical) w fracture of R arm; course cx by orthostatic hypotension managed with ProAmatine.   Date VP%  3/14 1  3/*16 95  10/17 95  10/18 92-DDD-DDI  6/19 68  10/19 84  4/20  3.3  7/21 9    Patient denies symptoms of respiratory, GI intolerance, sun sensitivity, neurological symptoms attributable to amiodarone. The patient denies chest pain, shortness of breath, nocturnal dyspnea, orthopnea or peripheral edema.  There have been no palpitations  or syncope.  Some lightheadedness.  Still weak but gradually recovering from her fall.  Walking with a cane.   DATE TEST EF Vp %    /13 Echo  55-65%    8/19 Echo  50-55% 84%  1/20 Echo   30-35%   11/21 Echo  55-60   5/22 Echo  55-55%   12/22 Echo  45% GLOBAL   12/22 Myoview <30%    9/23 Echo  55-60%      Date Cr K Hgb TSH LFT  8/18 0.67  12.2    9/18 0.64  10    1/20 0.95  11.8    6/20 0.9  11.7    5/22 0.77  11.3    10/22 0.8 4.4  4.84   12/22 0.8  3.7 12.5 4.062 25  3/23 0.85 4.5 8.3 2.04   8/23 1.0 4.1 11.8 2.54 29  9/24 0.8 4.5 11.3 2.13 (5/24) 30 (6/24)          Thromboembolic risk factors ( age  -2, HTN-1, TIA/CVA-2, DM-1, CHF-1, Gender-1) for a CHADSVASc Score of >=8    Past Medical History:  Diagnosis Date   Anxiety    Aortic atherosclerosis (HCC)    Arthritis    Bradycardia    Breast fibroadenoma, left 05/12/2001   a.) CNB (+) moderate epithelial hyperplasia without atypia and fibrocystic changes   CHF (congestive heart failure) (HCC)    Complication of anesthesia    heart stopped at the end of surgery with Dr. Rosita Kea during a back surgery in approx 2019.   Hypertension    Long term current use of anticoagulant    a.) dose reduced apixaban   MRSA nasal colonization 09/17/2016   Pacemaker 12/06/2003   Medtronic Impulse DTDR01   Pacemaker 2015   PAF (paroxysmal atrial fibrillation) (HCC)    a.) CHA2DS2-VASc = 8 (age x 2, sex, CHF, HTN, CVA x 2, aortic plaque). b.) rate/rhythm maintained on oral amiodarone + metoprolol succinate; chronically anticoagulated with dose reduced apixaban   Sick sinus syndrome (HCC)    Sinoatrial node  dysfunction (HCC)    Stroke [facility])    Vaginal intraepithelial neoplasia    VAIN (vaginal intraepithelial neoplasia)     Past Surgical History:  Procedure Laterality Date   APPENDECTOMY     BICEPT TENODESIS Right 04/09/2021   Procedure: BICEPS TENODESIS;  Surgeon: Signa Kell, MD;  Location: [facility] ORS;  Service: Orthopedics;  Laterality: Right;   BREAST BIOPSY Left 05/12/2001   moderate epithelial hyperplasia without atypia and fibrocystic changes   COLONOSCOPY     HERNIA REPAIR     lower abd   IMAGE GUIDED SINUS SURGERY  2015   INSERT / REPLACE / REMOVE PACEMAKER  12/06/2003   Medtronic impulse [id number]   JOINT REPLACEMENT Right    knee   KYPHOPLASTY N/A 06/23/2017   Procedure: [id number];  Surgeon: Kennedy Bucker, MD;  Location: [facility] ORS;  Service: Orthopedics;  Laterality: N/A;    LEEP  1999   OPEN REDUCTION INTERNAL FIXATION (ORIF) DISTAL RADIAL FRACTURE Right 09/15/2022   Procedure: OPEN REDUCTION INTERNAL FIXATION (ORIF) DISTAL RADIUS FRACTURE;  Surgeon: Christena Flake, MD;  Location: [facility] ORS;  Service: Orthopedics;  Laterality: Right;   ORIF HIP FRACTURE Right 02/2016   PACEMAKER GENERATOR CHANGE N/A 09/18/2012   Procedure: PACEMAKER GENERATOR CHANGE;  Surgeon: Duke Salvia, MD;  Location[facility] CATH LAB;  Service: Cardiovascular;  Laterality: N/A;   REVERSE SHOULDER ARTHROPLASTY Right 04/09/2021   Procedure: Right reverse shoulder arthroplasty for fracture;  Surgeon: Signa Kell, MD;  Location: [facility] ORS;  Service: Orthopedics;  Laterality: Right;   TONSILLECTOMY     TOTAL KNEE ARTHROPLASTY     TOTAL KNEE REVISION Right 09/23/2016   Procedure: TOTAL KNEE REVISION-POLYETHYLENE EXCHANGE;  Surgeon: Kennedy Bucker, MD;  Location: [facility] ORS;  Service: Orthopedics;  Laterality: Right;   VAGINAL HYSTERECTOMY      Current Outpatient Medications  Medication Sig Dispense Refill   amiodarone (PACERONE) 200 MG tablet Take 1 tablet by mouth 5 days a week 90 tablet 3   buPROPion (WELLBUTRIN XL) 150 MG 24 hr tablet Take 150 mg by mouth daily.     Calcium-Vitamin D (CALTRATE 600 PLUS-VIT D PO) Take 1 tablet by mouth daily.     ELIQUIS 2.5 MG TABS tablet TAKE 1 TABLET TWICE A DAY 180 tablet 3   midodrine (PROAMATINE) 2.5 MG tablet Take 1 tablet (2.5 mg total) by mouth 3 (three) times daily with meals. 270 tablet 2   Multiple Vitamins-Minerals (SENIOR MULTIVITAMIN PLUS PO) Take 1 tablet by mouth daily.     polyethylene glycol (MIRALAX / GLYCOLAX) 17 g packet Take 17 g by mouth daily as needed.     propranolol ER (INDERAL LA) 80 MG 24 hr capsule Take 1 capsule (80 mg total) by mouth daily. 90 capsule 1   risedronate (ACTONEL) 150 MG tablet Take 150 mg by mouth every 30 (thirty) days. with water on empty stomach, nothing by mouth or lie down for next 30 minutes.     acetaminophen  (TYLENOL) 500 MG tablet Take 500 mg by mouth every 8 (eight) hours as needed for mild pain or headache. Give 2 tablet by mouth twice daily (Patient not taking: Reported on 11/08/2022)     MANGANESE SULFATE IV Take 1 tablet by mouth daily.     No current facility-administered medications for this visit.    Allergies  Allergen Reactions   Latex Other (See Comments)    blisters   Other Other (See Comments)    Blisters/ paper tape is Education administrator paper  tape is Ok Engineering geologist is Musician paper tape is Dealer (See Comments)    Blisters/ paper tape is Education administrator paper tape is Ok   Xarelto [Rivaroxaban] Other (See Comments)    Gums bleeding and too much bruising   Nickel Rash and Other (See Comments)    Review of Systems negative except from HPI and PMH BP 128/82 (BP Location: Left Arm, Patient Position: Sitting, Cuff Size: Normal)   Pulse 70   Ht 5' 6.5" (1.689 m)   Wt 115 lb (52.2 kg)   SpO2 98%   BMI 18.28 kg/m  Well developed and well nourished in no acute distress HENT normal Neck supple with JVP-flat Clear Device pocket well healed; without hematoma or erythema.  There is no tethering  Regular rate and rhythm, no  gallop No  murmur Abd-soft with active BS No Clubbing cyanosis  edema Skin-warm and dry A & Oriented  Grossly normal sensory and motor function  ECG AV pacing at 75 with frequent PACs resulting in intermittent atrial noncapture or nonconducted with intermittent ventricular pacing  Device function is abnormal--with occasional--high freq. Programming changes decrease v sensitivity  See Paceart for details    Assessment and  Plan  Atrial arrhythmia/Fib (5.9%) with some RVR   Sinus node dysfunction  Hypertension   Syncope  High Risk Medication Surveillance amiodarone  Pacemaker  Medtronic  Dual chamber   Elevated pacing thresholds A Greater    HFpEF>> LV dysfunction resolved and now recurring   Orthostatic  hypotension  1 AVB-profound  Anemia  Ventricular high frequency signals     Fall apparently was mechanical getting caught up in a pillow.  Recommended automatic full technology either in the form of an Apple watch  Orthostatic hypotension is much improved with ProAmatine.  Will continue at 2.5 mg twice daily.   High-frequency PACs.  Will continue the low-dose propranolol although would have a low threshold for stopping it if she continues with orthostatic dizziness.  Amiodarone seems to be keeping her atrial fibrillation at bay.  No bleeding.  Continue it and the Eliquis.  Amiodarone can aggravate orthostasis.  Ventricular high-frequency signal suggest a fracture in the ventricular lead.  Ventricular pacing is infrequent and so intermittent elevation I do not think is clinically relevant.  We will keep our eye on this.

## 2022-11-08 NOTE — Patient Instructions (Signed)
Medication Instructions:  The current medical regimen is effective;  continue present plan and medications.  *If you need a refill on your cardiac medications before your next appointment, please call your pharmacy*   Lab Work: Your provider would like for you to have following labs drawn today TSH.   If you have labs (blood work) drawn today and your tests are completely normal, you will receive your results only by: MyChart Message (if you have MyChart) OR A paper copy in the mail If you have any lab test that is abnormal or we need to change your treatment, we will call you to review the results.   Follow-Up: At [facility], you and your health needs are our priority.  As part of our continuing mission to provide you with exceptional heart care, we have created designated Provider Care Teams.  These Care Teams include your primary Cardiologist (physician) and Advanced Practice Providers (APPs -  Physician Assistants and Nurse Practitioners) who all work together to provide you with the care you need, when you need it.  We recommend signing up for the patient portal called "MyChart".  Sign up information is provided on this After Visit Summary.  MyChart is used to connect with patients for Virtual Visits (Telemedicine).  Patients are able to view lab/test results, encounter notes, upcoming appointments, etc.  Non-urgent messages can be sent to your provider as well.   To learn more about what you can do with MyChart, go to [email or web address].    Your next appointment:   6 month(s)  Provider:   Sherryl Manges, MD

## 2022-11-09 LAB — TSH: TSH: 1.66 u[IU]/mL (ref 0.450–4.500)

## 2022-11-25 ENCOUNTER — Telehealth: Payer: Self-pay | Admitting: Internal Medicine

## 2022-11-25 NOTE — Telephone Encounter (Signed)
Nurse received a call from [facility] stating that Dr. Malvin Johns wanted to make Dr. Graciela Husbands aware that he decreased the patient's propranolol to 20 mg by mouth twice a day.  Will forward to Dr. Graciela Husbands

## 2022-12-01 ENCOUNTER — Other Ambulatory Visit: Payer: Self-pay | Admitting: Internal Medicine

## 2022-12-01 NOTE — Telephone Encounter (Signed)
Prescription refill request for Eliquis received. Indication: AF Last office visit: 11/08/22  Alejandra Fleming MD Scr: 0.8 on 10/07/22  Epic Age: 89 Weight: 52.2kg  Based on above findings Eliquis 2.5mg  twice daily is the appropriate dose.  Refill approved.

## 2022-12-21 ENCOUNTER — Encounter: Payer: Self-pay | Admitting: Internal Medicine

## 2022-12-21 ENCOUNTER — Other Ambulatory Visit: Payer: Self-pay | Admitting: Internal Medicine

## 2022-12-21 DIAGNOSIS — R7303 Prediabetes: Secondary | ICD-10-CM

## 2022-12-21 DIAGNOSIS — E039 Hypothyroidism, unspecified: Secondary | ICD-10-CM

## 2022-12-21 DIAGNOSIS — I779 Disorder of arteries and arterioles, unspecified: Secondary | ICD-10-CM

## 2022-12-21 DIAGNOSIS — N6323 Unspecified lump in the left breast, lower outer quadrant: Secondary | ICD-10-CM

## 2022-12-21 DIAGNOSIS — N6320 Unspecified lump in the left breast, unspecified quadrant: Secondary | ICD-10-CM

## 2022-12-31 ENCOUNTER — Ambulatory Visit
Admission: RE | Admit: 2022-12-31 | Discharge: 2022-12-31 | Disposition: A | Discharge status: Home / Self Care | Payer: Medicare Other | Source: Ambulatory Visit | Attending: Internal Medicine | Admitting: Internal Medicine

## 2022-12-31 DIAGNOSIS — E039 Hypothyroidism, unspecified: Secondary | ICD-10-CM | POA: Insufficient documentation

## 2022-12-31 DIAGNOSIS — I779 Disorder of arteries and arterioles, unspecified: Secondary | ICD-10-CM | POA: Insufficient documentation

## 2022-12-31 DIAGNOSIS — N6323 Unspecified lump in the left breast, lower outer quadrant: Secondary | ICD-10-CM | POA: Diagnosis present

## 2022-12-31 DIAGNOSIS — R7303 Prediabetes: Secondary | ICD-10-CM | POA: Insufficient documentation

## 2022-12-31 DIAGNOSIS — N6320 Unspecified lump in the left breast, unspecified quadrant: Secondary | ICD-10-CM | POA: Insufficient documentation

## 2023-01-05 ENCOUNTER — Ambulatory Visit: Payer: Medicare Other | Admitting: Dermatology

## 2023-01-20 ENCOUNTER — Ambulatory Visit (INDEPENDENT_AMBULATORY_CARE_PROVIDER_SITE_OTHER): Payer: Medicare Other

## 2023-01-20 DIAGNOSIS — I495 Sick sinus syndrome: Secondary | ICD-10-CM

## 2023-01-21 LAB — CUP PACEART REMOTE DEVICE CHECK
Battery Impedance: 3005 Ohm
Battery Remaining Longevity: 21 mo
Battery Voltage: 2.73 V
Brady Statistic AP VP Percent: 17 %
Brady Statistic AP VS Percent: 65 %
Brady Statistic AS VP Percent: 5 %
Brady Statistic AS VS Percent: 12 %
Date Time Interrogation Session: 20250102164413
Implantable Lead Connection Status: 753985
Implantable Lead Connection Status: 753985
Implantable Lead Implant Date: 20051130
Implantable Lead Implant Date: 20051130
Implantable Lead Location: 753859
Implantable Lead Location: 753860
Implantable Lead Model: 5076
Implantable Pulse Generator Implant Date: 20140901
Lead Channel Impedance Value: 487 Ohm
Lead Channel Impedance Value: 596 Ohm
Lead Channel Pacing Threshold Amplitude: 0.375 V
Lead Channel Pacing Threshold Amplitude: 0.875 V
Lead Channel Pacing Threshold Pulse Width: 0.4 ms
Lead Channel Pacing Threshold Pulse Width: 0.4 ms
Lead Channel Setting Pacing Amplitude: 2 V
Lead Channel Setting Pacing Amplitude: 2.5 V
Lead Channel Setting Pacing Pulse Width: 0.4 ms
Lead Channel Setting Sensing Sensitivity: 4 mV
Zone Setting Status: 755011
Zone Setting Status: 755011

## 2023-01-23 ENCOUNTER — Encounter: Payer: Self-pay | Admitting: Internal Medicine

## 2023-02-02 ENCOUNTER — Encounter: Payer: Self-pay | Admitting: Internal Medicine

## 2023-02-16 ENCOUNTER — Encounter: Payer: Self-pay | Admitting: Internal Medicine

## 2023-02-16 ENCOUNTER — Telehealth: Payer: Self-pay | Admitting: Internal Medicine

## 2023-02-16 NOTE — Telephone Encounter (Signed)
Will see her tomorrow

## 2023-02-16 NOTE — Telephone Encounter (Signed)
Called patient, she states she has been having issues with her blood pressure for a while now. Messaging via [employer], see messages.   Patient states that she is taking Propranolol 20 mg twice daily (40 mg daily)- med list states 80 mg daily, she states this was decreased. Most recent BP taken while on the phone was 166/94 HR 82.   Patient states she feels fine, does not have any symptoms, but BP is concerning her.   Scheduled with MD tomorrow 01/30 at 3:20 PM. Patient verbalized understanding, will route to MD to make aware.

## 2023-02-16 NOTE — Telephone Encounter (Signed)
Pt c/o BP issue: STAT if pt c/o blurred vision, one-sided weakness or slurred speech  1. What are your last 5 BP readings? Most recent 181/106 with the last week   2. Are you having any other symptoms (ex. Dizziness, headache, blurred vision, passed out)? No  3. What is your BP issue? Pt is concerned about her increase in BP lately and is requesting a callback. Please advise

## 2023-02-17 ENCOUNTER — Ambulatory Visit: Payer: Medicare Other | Attending: Internal Medicine | Admitting: Internal Medicine

## 2023-02-17 ENCOUNTER — Encounter: Payer: Self-pay | Admitting: Internal Medicine

## 2023-02-17 VITALS — BP 150/71 | HR 70 | Ht 66.5 in | Wt 114.8 lb

## 2023-02-17 DIAGNOSIS — Z950 Presence of cardiac pacemaker: Secondary | ICD-10-CM | POA: Insufficient documentation

## 2023-02-17 DIAGNOSIS — I480 Paroxysmal atrial fibrillation: Secondary | ICD-10-CM | POA: Insufficient documentation

## 2023-02-17 DIAGNOSIS — I5032 Chronic diastolic (congestive) heart failure: Secondary | ICD-10-CM | POA: Diagnosis present

## 2023-02-17 DIAGNOSIS — I495 Sick sinus syndrome: Secondary | ICD-10-CM | POA: Diagnosis present

## 2023-02-17 LAB — CUP PACEART INCLINIC DEVICE CHECK
Battery Impedance: 2747 Ohm
Battery Remaining Longevity: 23 mo
Battery Voltage: 2.73 V
Brady Statistic AP VP Percent: 16 %
Brady Statistic AP VS Percent: 63 %
Brady Statistic AS VP Percent: 5 %
Brady Statistic AS VS Percent: 15 %
Date Time Interrogation Session: 20250130165208
Implantable Lead Connection Status: 753985
Implantable Lead Connection Status: 753985
Implantable Lead Implant Date: 20051130
Implantable Lead Implant Date: 20051130
Implantable Lead Location: 753859
Implantable Lead Location: 753860
Implantable Lead Model: 5076
Implantable Pulse Generator Implant Date: 20140901
Lead Channel Impedance Value: 471 Ohm
Lead Channel Impedance Value: 570 Ohm
Lead Channel Pacing Threshold Amplitude: 0.375 V
Lead Channel Pacing Threshold Amplitude: 0.5 V
Lead Channel Pacing Threshold Amplitude: 0.75 V
Lead Channel Pacing Threshold Amplitude: 0.875 V
Lead Channel Pacing Threshold Pulse Width: 0.4 ms
Lead Channel Pacing Threshold Pulse Width: 0.4 ms
Lead Channel Pacing Threshold Pulse Width: 0.4 ms
Lead Channel Pacing Threshold Pulse Width: 0.4 ms
Lead Channel Sensing Intrinsic Amplitude: 11.2 mV
Lead Channel Sensing Intrinsic Amplitude: 2 mV
Lead Channel Setting Pacing Amplitude: 2 V
Lead Channel Setting Pacing Amplitude: 2.5 V
Lead Channel Setting Pacing Pulse Width: 0.4 ms
Lead Channel Setting Sensing Sensitivity: 4 mV
Zone Setting Status: 755011
Zone Setting Status: 755011

## 2023-02-17 MED ORDER — AMLODIPINE BESYLATE 2.5 MG PO TABS: 2.5000 mg | ORAL_TABLET | Freq: Every day | ORAL | 3 refills | Status: DC

## 2023-02-17 MED ORDER — AMLODIPINE BESYLATE 2.5 MG PO TABS: 2.5000 mg | ORAL_TABLET | Freq: Every day | ORAL | 0 refills | Status: DC

## 2023-02-17 NOTE — Telephone Encounter (Signed)
Patient seen in office 01/30

## 2023-02-17 NOTE — Patient Instructions (Signed)
Medication Instructions:  Your physician has recommended you make the following change in your medication:   ** Increase Propranolol 20mg  to 2 tablets (40mg ) by mouth twice daily x 3 days then return to Propranolol 20mg  - 1 tablet by mouth twice daily.  ** Begin Amlodipine 2.5mg  - 1 tablet by mouth daily.  *If you need a refill on your cardiac medications before your next appointment, please call your pharmacy*   Lab Work: None ordered.  If you have labs (blood work) drawn today and your tests are completely normal, you will receive your results only by: MyChart Message (if you have MyChart) OR A paper copy in the mail If you have any lab test that is abnormal or we need to change your treatment, we will call you to review the results.   Testing/Procedures: None ordered.    Follow-Up: At [facility], you and your health needs are our priority.  As part of our continuing mission to provide you with exceptional heart care, we have created designated Provider Care Teams.  These Care Teams include your primary Cardiologist (physician) and Advanced Practice Providers (APPs -  Physician Assistants and Nurse Practitioners) who all work together to provide you with the care you need, when you need it.  We recommend signing up for the patient portal called "MyChart".  Sign up information is provided on this After Visit Summary.  MyChart is used to connect with patients for Virtual Visits (Telemedicine).  Patients are able to view lab/test results, encounter notes, upcoming appointments, etc.  Non-urgent messages can be sent to your provider as well.   To learn more about what you can do with MyChart, go to [email or web address].    Your next appointment:   4-5 weeks with Sherie Don, NP

## 2023-02-17 NOTE — Progress Notes (Unsigned)
Patient Care Team: Lauro Regulus, MD as PCP - General (Unknown Physician Specialty) Duke Salvia, MD as PCP - Cardiology (Cardiology) Duke Salvia, MD as PCP - Electrophysiology (Cardiology)   HPI  Alejandra Wiley is a 89 y.o. female seen in followup for palpitations associated with atrial arrhythmias as well as bradycardia requiring backup bradycardia pacing. She previously took flecainide.  Resulted in prolonged AV nodal conduction and ventricular pacing.  Deterioration of LV function; flecainide discontinued with subsequent normalization of LV function.  Ended up on amiodarone 12/22 following hospitalization with AFib RVF and acute CHF   Hypertension and orthostasic LH and recurrent falls but without demonstrable orthostatic hypotension  1/20 Hospitalized with stroke- R sided weakness.  INR subtherapeutic 1.52.  >>imaging  CT  No acute stroke.  Carotids neg.  Coumadin d/c and apixoban started ( 2.5 mg bid -wt 55 kg;age->82)  Interval fall (attributed to mechanical) w fracture of R arm; course cx by orthostatic hypotension managed with ProAmatine.   Date VP%  3/14 1  3/*16 95  10/17 95  10/18 92-DDD-DDI  6/19 68  10/19 84  4/20  3.3  7/21 9    Patient denies symptoms of respiratory, GI intolerance, sun sensitivity, neurological symptoms attributable to amiodarone. The patient denies chest pain, shortness of breath, nocturnal dyspnea, orthopnea or peripheral edema.  There have been no palpitations  or syncope.   Is feeling better.  Less fatigue.  At physical therapy she got lightheaded 1 day, and was told that her blood pressure was elevated.  Persistently elevated at home and call the office we discontinued her ProAmatine it remains elevated.  No complaints of chest pain or shortness of breath.   DATE TEST EF Vp %    /13 Echo  55-65%    8/19 Echo  50-55% 84%  1/20 Echo   30-35%   11/21 Echo  55-60   5/22 Echo  55-55%   12/22 Echo  45% GLOBAL   12/22  Myoview <30%    9/23 Echo  55-60%      Date Cr K Hgb TSH LFT  8/18 0.67  12.2    9/18 0.64  10    1/20 0.95  11.8    6/20 0.9  11.7    5/22 0.77  11.3    10/22 0.8 4.4  4.84   12/22 0.8 3.7 12.5 4.062 25  3/23 0.85 4.5 8.3 2.04   8/23 1.0 4.1 11.8 2.54 29  9/24 0.8 4.5 11.3 2.13 (5/24) 30 (6/24)          Thromboembolic risk factors ( age  -2, HTN-1, TIA/CVA-2, DM-1, CHF-1, Gender-1) for a CHADSVASc Score of >=8    Past Medical History:  Diagnosis Date   Anxiety    Aortic atherosclerosis (HCC)    Arthritis    Bradycardia    Breast fibroadenoma, left 05/12/2001   a.) CNB (+) moderate epithelial hyperplasia without atypia and fibrocystic changes   CHF (congestive heart failure) (HCC)    Complication of anesthesia    heart stopped at the end of surgery with Dr. Rosita Kea during a back surgery in approx 2019.   Hypertension    Long term current use of anticoagulant    a.) dose reduced apixaban   MRSA nasal colonization 09/17/2016   Pacemaker 12/06/2003   Medtronic Impulse DTDR01   Pacemaker 2015   PAF (paroxysmal atrial fibrillation) (HCC)    a.) CHA2DS2-VASc = 8 (age x  2, sex, CHF, HTN, CVA x 2, aortic plaque). b.) rate/rhythm maintained on oral amiodarone + metoprolol succinate; chronically anticoagulated with dose reduced apixaban   Sick sinus syndrome (HCC)    Sinoatrial node dysfunction (HCC)    Stroke (HCC)    Vaginal intraepithelial neoplasia    VAIN (vaginal intraepithelial neoplasia)     Past Surgical History:  Procedure Laterality Date   APPENDECTOMY     BICEPT TENODESIS Right 04/09/2021   Procedure: BICEPS TENODESIS;  Surgeon: Signa Kell, MD;  Location: [facility] ORS;  Service: Orthopedics;  Laterality: Right;   BREAST BIOPSY Left 05/12/2001   moderate epithelial hyperplasia without atypia and fibrocystic changes   COLONOSCOPY     HERNIA REPAIR     lower abd   IMAGE GUIDED SINUS SURGERY  2015   INSERT / REPLACE / REMOVE PACEMAKER  12/06/2003   Medtronic  impulse DTDR01   JOINT REPLACEMENT Right    knee   KYPHOPLASTY N/A 06/23/2017   Procedure: [id number];  Surgeon: Kennedy Bucker, MD;  Location: [facility] ORS;  Service: Orthopedics;  Laterality: N/A;   LEEP  1999   OPEN REDUCTION INTERNAL FIXATION (ORIF) DISTAL RADIAL FRACTURE Right 09/15/2022   Procedure: OPEN REDUCTION INTERNAL FIXATION (ORIF) DISTAL RADIUS FRACTURE;  Surgeon: Christena Flake, MD;  Location: [facility] ORS;  Service: Orthopedics;  Laterality: Right;   ORIF HIP FRACTURE Right 02/2016   PACEMAKER GENERATOR CHANGE N/A 09/18/2012   Procedure: PACEMAKER GENERATOR CHANGE;  Surgeon: Duke Salvia, MD;  Location: [facility] CATH LAB;  Service: Cardiovascular;  Laterality: N/A;   REVERSE SHOULDER ARTHROPLASTY Right 04/09/2021   Procedure: Right reverse shoulder arthroplasty for fracture;  Surgeon: Signa Kell, MD;  Location: [facility] ORS;  Service: Orthopedics;  Laterality: Right;   TONSILLECTOMY     TOTAL KNEE ARTHROPLASTY     TOTAL KNEE REVISION Right 09/23/2016   Procedure: TOTAL KNEE REVISION-POLYETHYLENE EXCHANGE;  Surgeon: Kennedy Bucker, MD;  Location: [facility] ORS;  Service: Orthopedics;  Laterality: Right;   VAGINAL HYSTERECTOMY      Current Outpatient Medications  Medication Sig Dispense Refill   acetaminophen (TYLENOL) 500 MG tablet Take 500 mg by mouth every 8 (eight) hours as needed for mild pain (pain score 1-3) or headache. Give 2 tablet by mouth twice daily     amiodarone (PACERONE) 200 MG tablet Take 1 tablet by mouth 5 days a week 90 tablet 3   apixaban (ELIQUIS) 2.5 MG TABS tablet TAKE 1 TABLET TWICE A DAY 180 tablet 1   buPROPion (WELLBUTRIN XL) 150 MG 24 hr tablet Take 150 mg by mouth daily.     Calcium-Vitamin D (CALTRATE 600 PLUS-VIT D PO) Take 1 tablet by mouth daily.     MANGANESE SULFATE IV Take 1 tablet by mouth daily.     midodrine (PROAMATINE) 2.5 MG tablet Take 1 tablet (2.5 mg total) by mouth 3 (three) times daily with meals. 270 tablet 2   Multiple Vitamins-Minerals  (SENIOR MULTIVITAMIN PLUS PO) Take 1 tablet by mouth daily.     polyethylene glycol (MIRALAX / GLYCOLAX) 17 g packet Take 17 g by mouth daily as needed.     risedronate (ACTONEL) 150 MG tablet Take 150 mg by mouth every 30 (thirty) days. with water on empty stomach, nothing by mouth or lie down for next 30 minutes.     propranolol (INDERAL) 20 MG tablet Take 20 mg by mouth 2 (two) times daily.     No current facility-administered medications for this visit.    Allergies  Allergen Reactions   Latex Other (See Comments)    blisters   Other Other (See Comments)    Blisters/ paper tape is Education administrator paper tape is Education administrator paper tape is Musician paper tape is Ok   Sport and exercise psychologist (See Comments)    Blisters/ paper tape is Education administrator paper tape is Ok   Xarelto [Rivaroxaban] Other (See Comments)    Gums bleeding and too much bruising   Nickel Rash and Other (See Comments)    Review of Systems negative except from HPI and PMH BP (!) 150/71 (BP Location: Left Arm, Patient Position: Sitting, Cuff Size: Normal)   Pulse 70   Ht 5' 6.5" (1.689 m)   Wt 114 lb 12.8 oz (52.1 kg)   SpO2 97%   BMI 18.25 kg/m  Well developed and well nourished in no acute distress HENT normal Neck supple with JVP-flat Clear Device pocket well healed; without hematoma or erythema.  There is no tethering  Regular rate and rhythm, no murmur Abd-soft with active BS No Clubbing cyanosis  edema Skin-warm and dry A & Oriented  Grossly normal sensory and motor function  ECG A pacing 70  32/15/36 Left bundle branch block  Device function is normal. Programming changes none  See Paceart for details    Assessment and  Plan  Atrial arrhythmia/Fib (5.9%) with some RVR   Sinus node dysfunction  Hypertension   Syncope  High Risk Medication Surveillance amiodarone  Pacemaker  Medtronic  Dual chamber   Elevated pacing thresholds A Greater    HFpEF>> LV dysfunction resolved and now recurring    Orthostatic hypotension  1 AVB-profound  Anemia  Ventricular high frequency signals  Blood pressure is elevated.  Without symptoms of orthostasis.  In the last week the ProAmatine was discontinued.  Blood pressures remain elevated.  Repeat today is 180.  Will add low-dose amlodipine 2.5 mg.  Will increase her propranolol for 3 days as the amlodipine begins to have effect from 20 twice daily--40 twice daily and then resume it at 20.  For right now we will hold off on resuming the ProAmatine.  Will see her again in 3 to 4 weeks however to assess orthostatics and as to whether ProAmatine needs to be resumed  Amiodarone will continue for atrial fibrillation frequency reduction tolerating.  Will need recheck of surveillance laboratories in 3 months

## 2023-02-28 ENCOUNTER — Encounter: Payer: Self-pay | Admitting: Internal Medicine

## 2023-03-03 NOTE — Addendum Note (Signed)
Addended by: Elease Etienne A on: 03/03/2023 11:34 AM   Modules accepted: Orders

## 2023-03-03 NOTE — Progress Notes (Signed)
Remote pacemaker transmission.

## 2023-03-10 MED ORDER — AMLODIPINE BESYLATE 5 MG PO TABS: 5.0000 mg | ORAL_TABLET | Freq: Every day | ORAL | 3 refills | Status: DC

## 2023-03-16 NOTE — Progress Notes (Unsigned)
 Electrophysiology Clinic Note    Date:  03/17/2023  Patient ID:  Alejandra Wiley, Alejandra Wiley Feb 09, 1932, MRN [id number] PCP:  Lauro Regulus, MD  Cardiologist:  Sherryl Manges, MD Electrophysiologist: Sherryl Manges, MD   Discussed the use of AI scribe software for clinical note transcription with the patient, who gave verbal consent to proceed.   Patient Profile    Chief Complaint: HTN  History of Present Illness: Alejandra Wiley is a 89 y.o. female with PMH notable for SND s/p PPM, parox AFib, HFimpEF, HTN, orthostatic hypotension, CVA (associated with sub-therapeutic INR) ; seen today for Sherryl Manges, MD for routine electrophysiology followup.   Her orthostatic hypotension had previously been managed with proAmatine. She last saw Dr. Graciela Husbands 01/2023 with elevated BP after stopping proamatine. He started 2.5mg  amlodipine with close follow-up. She messaged clinic in early 02/2023 that BP was still elevated, and amlodipine increased to 5mg   On follow-up today, she has been taking 5mg  amlodipine for just a few days and is tolerating it well.  BP readings at home on 5mg  amlodipine have been running 140-160 systolic. She does have orthostatic hypotension with position changes. Has been sitting on side of bed for several seconds before rising to use bathroom. She is very concerned about her PT sessions and states that her PT will not let her have sessions if her BP is elevated over 140 systolic. She has had two falls in the last two years, resulting in broken hip and shattered R shoulder.  She continues to take amiodarone 200mg  5 days a week. She is not aware of any recent AF episodes.  Continues to take eliquis BID, no missed doses, no bleeding concerns.       Device Information: MDT dual chamber PPM, imp 11/2003; dx SND St. Jude RA lead Gen change 09/2012   AAD History: Flecainide - prolonged AV conduction > inc Vpacing > dec LV function.  - flec d/c with normalization of LV  function  Amiodarone - current    ROS:  Please see the history of present illness. All other systems are reviewed and otherwise negative.    Physical Exam    VS:  BP (!) 147/79 (BP Location: Left Arm, Patient Position: Sitting, Cuff Size: Normal)   Pulse 74   Ht 5' 6.5" (1.689 m)   Wt 113 lb 8 oz (51.5 kg)   SpO2 96%   BMI 18.04 kg/m  BMI: Body mass index is 18.04 kg/m.  Orthostatic VS for the past 24 hrs (Last 3 readings):  BP- Lying Pulse- Lying BP- Sitting Pulse- Sitting BP- Standing at 0 minutes Pulse- Standing at 0 minutes BP- Standing at 3 minutes Pulse- Standing at 3 minutes  03/17/23 0959 152/75 70 134/72 69 132/82 70 131/76 70      Wt Readings from Last 3 Encounters:  03/17/23 113 lb 8 oz (51.5 kg)  02/17/23 114 lb 12.8 oz (52.1 kg)  11/08/22 115 lb (52.2 kg)     GEN- The patient is well appearing, alert and oriented x 3 today.   Lungs- Clear to ausculation bilaterally, normal work of breathing.  Heart- Regular rate and rhythm, no murmurs, rubs or gallops Extremities- No peripheral edema, warm, dry Skin-   device pocket well-healed, no tethering   Device interrogation done today and reviewed by myself:  Battery 20 months Lead thresholds, impedence, sensing stable  AF burden 3.7% No changes made today   Studies Reviewed   Previous EP, cardiology notes.  EKG is not ordered. Personal review of EKG from  02/17/2023  shows:  AP with 1st deg HB, LBBB PR 318         TTE, 10/15/2021 1. Left ventricular ejection fraction, by estimation, is 55 to 60%. The left ventricle has normal function. The left ventricle has no regional wall motion abnormalities. Left ventricular diastolic parameters are consistent with Grade II diastolic  dysfunction (pseudonormalization).   2. Right ventricular systolic function is normal. The right ventricular size is normal. There is mildly elevated pulmonary artery systolic pressure. The estimated right ventricular systolic  pressure is 41.5 mmHg.   3. Left atrial size was mild to moderately dilated.   4. Right atrial size was mildly dilated.   5. The mitral valve is normal in structure. Mild to moderate mitral valve regurgitation. No evidence of mitral stenosis.   6. Tricuspid valve regurgitation is moderate.   7. The aortic valve is tricuspid. Aortic valve regurgitation is not visualized. No aortic stenosis is present.   8. The inferior vena cava is normal in size with greater than 50% respiratory variability, suggesting right atrial pressure of 3 mmHg.   Comparison(s): 12/31/20-EF45% MVR TVR.       Assessment and Plan    #) HTN #) orthostatic hypotension Elevated blood pressure readings at home though with positive orthostatic hypotension with drop of with position changes today in clinic Will continue 5mg  amlodipine at this time and 20mg  propranolol Monitor blood pressure at home once daily, ensuring measurements are taken at least an hour or two after morning medications.  If blood pressure readings at home are consistently in the 120s-130s or patient experiences increased dizziness with position changes, consider reducing Amlodipine dose. Provided letter for PT to allow permissive hypertension with PT sessions   #) parox afib #) high risk medication use - amiodarone Maintaining sinus rhtyhm on 200mg  amiodarone daily Recent thyroid, LFTs stable (via care everywhere)  #) Hypercoag d/t parox afib CHA2DS2-VASc Score = at least 7 [CHF History: 1, HTN History: 1, Diabetes History: 0, Stroke History: 2, Vascular Disease History: 0, Age Score: 2, Gender Score: 1].  Therefore, the patient's annual risk of stroke is 11.2 %.    Stroke ppx - 2.5mg  eliquis BID, dose appropriate reduced for age, weight No bleeding concerns Stable H/H (via care everywhere)        Current medicines are reviewed at length with the patient today.   The patient has concerns regarding her medicines.  The following  changes were made today:  none  Labs/ tests ordered today include:  No orders of the defined types were placed in this encounter.    Disposition: Follow up with Dr. Graciela Husbands or EP APP in 3 months   I spent 35 minutes in care of patient today including face-to-face time counseling, reviewing external labwork, and developing plan of care   Signed, Sherie Don, NP  03/17/23  10:08 AM  Electrophysiology [facility]

## 2023-03-17 ENCOUNTER — Encounter: Payer: Self-pay | Admitting: Cardiology

## 2023-03-17 ENCOUNTER — Ambulatory Visit: Payer: Medicare Other | Attending: Cardiology | Admitting: Cardiology

## 2023-03-17 ENCOUNTER — Other Ambulatory Visit: Payer: Self-pay | Admitting: Internal Medicine

## 2023-03-17 VITALS — BP 147/79 | HR 74 | Ht 66.5 in | Wt 113.5 lb

## 2023-03-17 DIAGNOSIS — I480 Paroxysmal atrial fibrillation: Secondary | ICD-10-CM

## 2023-03-17 DIAGNOSIS — Z950 Presence of cardiac pacemaker: Secondary | ICD-10-CM

## 2023-03-17 DIAGNOSIS — I951 Orthostatic hypotension: Secondary | ICD-10-CM | POA: Diagnosis present

## 2023-03-17 DIAGNOSIS — I10 Essential (primary) hypertension: Secondary | ICD-10-CM | POA: Diagnosis present

## 2023-03-17 DIAGNOSIS — I495 Sick sinus syndrome: Secondary | ICD-10-CM

## 2023-03-17 DIAGNOSIS — D6869 Other thrombophilia: Secondary | ICD-10-CM

## 2023-03-17 LAB — CUP PACEART INCLINIC DEVICE CHECK
Date Time Interrogation Session: 20250227130134
Implantable Lead Connection Status: 753985
Implantable Lead Connection Status: 753985
Implantable Lead Implant Date: 20051130
Implantable Lead Implant Date: 20051130
Implantable Lead Location: 753859
Implantable Lead Location: 753860
Implantable Lead Model: 5076
Implantable Pulse Generator Implant Date: 20140901

## 2023-03-17 NOTE — Patient Instructions (Signed)
 Medication Instructions:   Your physician recommends that you continue on your current medications as directed. Please refer to the Current Medication list given to you today.  *If you need a refill on your cardiac medications before your next appointment, please call your pharmacy*   Lab Work: No labs ordered today  If you have labs (blood work) drawn today and your tests are completely normal, you will receive your results only by: MyChart Message (if you have MyChart) OR A paper copy in the mail If you have any lab test that is abnormal or we need to change your treatment, we will call you to review the results.   Testing/Procedures: No test ordered today    Follow-Up: At [facility], you and your health needs are our priority.  As part of our continuing mission to provide you with exceptional heart care, we have created designated Provider Care Teams.  These Care Teams include your primary Cardiologist (physician) and Advanced Practice Providers (APPs -  Physician Assistants and Nurse Practitioners) who all work together to provide you with the care you need, when you need it.  We recommend signing up for the patient portal called "MyChart".  Sign up information is provided on this After Visit Summary.  MyChart is used to connect with patients for Virtual Visits (Telemedicine).  Patients are able to view lab/test results, encounter notes, upcoming appointments, etc.  Non-urgent messages can be sent to your provider as well.   To learn more about what you can do with MyChart, go to [email or web address].    Your next appointment:   3 month(s)  Provider:   You will see one of the following Advanced Practice Providers on your designated Care Team:   Francis Dowse, Charlott Holler [address] [place], [place] Sherie Don, NP Canary Brim, NP

## 2023-03-23 ENCOUNTER — Ambulatory Visit: Payer: Medicare Other | Admitting: Dermatology

## 2023-04-04 ENCOUNTER — Encounter: Payer: Self-pay | Admitting: Internal Medicine

## 2023-04-21 ENCOUNTER — Ambulatory Visit (INDEPENDENT_AMBULATORY_CARE_PROVIDER_SITE_OTHER): Payer: Self-pay

## 2023-04-21 DIAGNOSIS — I495 Sick sinus syndrome: Secondary | ICD-10-CM | POA: Diagnosis not present

## 2023-04-25 LAB — CUP PACEART REMOTE DEVICE CHECK
Battery Impedance: 3331 Ohm
Battery Remaining Longevity: 17 mo
Battery Voltage: 2.72 V
Brady Statistic AP VP Percent: 16 %
Brady Statistic AP VS Percent: 58 %
Brady Statistic AS VP Percent: 7 %
Brady Statistic AS VS Percent: 19 %
Date Time Interrogation Session: 20250405151645
Implantable Lead Connection Status: 753985
Implantable Lead Connection Status: 753985
Implantable Lead Implant Date: 20051130
Implantable Lead Implant Date: 20051130
Implantable Lead Location: 753859
Implantable Lead Location: 753860
Implantable Lead Model: 5076
Implantable Pulse Generator Implant Date: 20140901
Lead Channel Impedance Value: 491 Ohm
Lead Channel Impedance Value: 560 Ohm
Lead Channel Pacing Threshold Amplitude: 0.375 V
Lead Channel Pacing Threshold Amplitude: 1.125 V
Lead Channel Pacing Threshold Pulse Width: 0.4 ms
Lead Channel Pacing Threshold Pulse Width: 0.4 ms
Lead Channel Setting Pacing Amplitude: 2 V
Lead Channel Setting Pacing Amplitude: 2.5 V
Lead Channel Setting Pacing Pulse Width: 0.4 ms
Lead Channel Setting Sensing Sensitivity: 4 mV
Zone Setting Status: 755011
Zone Setting Status: 755011

## 2023-05-07 ENCOUNTER — Encounter: Payer: Self-pay | Admitting: Internal Medicine

## 2023-05-30 ENCOUNTER — Other Ambulatory Visit: Payer: Self-pay | Admitting: Internal Medicine

## 2023-05-30 NOTE — Telephone Encounter (Signed)
 Prescription refill request for Eliquis  received. Indication:afib Last office visit:2/25 Scr:0.8  12/24 Age: 89 Weight:51.5  kg  Prescription refilled

## 2023-05-31 ENCOUNTER — Encounter: Payer: Self-pay | Admitting: Dermatology

## 2023-05-31 ENCOUNTER — Other Ambulatory Visit: Payer: Self-pay | Admitting: Internal Medicine

## 2023-05-31 ENCOUNTER — Ambulatory Visit: Payer: Medicare Other | Admitting: Dermatology

## 2023-05-31 DIAGNOSIS — L820 Inflamed seborrheic keratosis: Secondary | ICD-10-CM | POA: Diagnosis not present

## 2023-05-31 DIAGNOSIS — L814 Other melanin hyperpigmentation: Secondary | ICD-10-CM | POA: Diagnosis not present

## 2023-05-31 DIAGNOSIS — D692 Other nonthrombocytopenic purpura: Secondary | ICD-10-CM

## 2023-05-31 DIAGNOSIS — L821 Other seborrheic keratosis: Secondary | ICD-10-CM

## 2023-05-31 DIAGNOSIS — L578 Other skin changes due to chronic exposure to nonionizing radiation: Secondary | ICD-10-CM | POA: Diagnosis not present

## 2023-05-31 DIAGNOSIS — Z1283 Encounter for screening for malignant neoplasm of skin: Secondary | ICD-10-CM

## 2023-05-31 DIAGNOSIS — L905 Scar conditions and fibrosis of skin: Secondary | ICD-10-CM

## 2023-05-31 DIAGNOSIS — I781 Nevus, non-neoplastic: Secondary | ICD-10-CM

## 2023-05-31 DIAGNOSIS — D229 Melanocytic nevi, unspecified: Secondary | ICD-10-CM | POA: Diagnosis not present

## 2023-05-31 DIAGNOSIS — Z7189 Other specified counseling: Secondary | ICD-10-CM

## 2023-05-31 NOTE — Patient Instructions (Addendum)

## 2023-05-31 NOTE — Progress Notes (Signed)
 Follow-Up Visit   Subjective  Alejandra Wiley is a 89 y.o. female who presents for the following: Skin Cancer Screening and Full Body Skin Exam, check spots face, hx of ISK L cheek txted with LN2, has recurred, hx of fall ~08/2022 bruising on arm and dent in forehead  The patient presents for Total-Body Skin Exam (TBSE) for skin cancer screening and mole check. The patient has spots, moles and lesions to be evaluated, some may be new or changing and the patient may have concern these could be cancer. The following portions of the chart were reviewed this encounter and updated as appropriate: medications, allergies, medical history  Review of Systems:  No other skin or systemic complaints except as noted in HPI or Assessment and Plan.  Objective  Well appearing patient in no apparent distress; mood and affect are within normal limits.  A full examination was performed including scalp, head, eyes, ears, nose, lips, neck, chest, axillae, abdomen, back, buttocks, bilateral upper extremities, bilateral lower extremities, hands, feet, fingers, toes, fingernails, and toenails. All findings within normal limits unless otherwise noted below.   Relevant physical exam findings are noted in the Assessment and Plan.  L cheek x 1 Stuck on waxy paps with erythema  Right Nose    Assessment & Plan   SKIN CANCER SCREENING PERFORMED TODAY.  ACTINIC DAMAGE - Chronic condition, secondary to cumulative UV/sun exposure - diffuse scaly erythematous macules with underlying dyspigmentation - Recommend daily broad spectrum sunscreen SPF 30+ to sun-exposed areas, reapply every 2 hours as needed.  - Staying in the shade or wearing long sleeves, sun glasses (UVA+UVB protection) and wide brim hats (4-inch brim around the entire circumference of the hat) are also recommended for sun protection.  - Call for new or changing lesions.  LENTIGINES, SEBORRHEIC KERATOSES, HEMANGIOMAS - Benign normal skin lesions -  Benign-appearing - Call for any changes  MELANOCYTIC NEVI - Tan-brown and/or pink-flesh-colored symmetric macules and papules - Benign appearing on exam today - Observation - Call clinic for new or changing moles - Recommend daily use of broad spectrum spf 30+ sunscreen to sun-exposed areas.   Purpura - Chronic; persistent and recurrent.  Treatable, but not curable. arms - Violaceous macules and patches - Benign - Related to trauma, age, sun damage and/or use of blood thinners, chronic use of topical and/or oral steroids - Observe - Can use OTC arnica containing moisturizer such as Dermend Bruise Formula if desired - Call for worsening or other concerns  TELANGIECTASIA R nose Exam: dilated blood vessel(s) Treatment Plan: Benign appearing on exam Call for changes  Counseling for BBL / IPL / Laser and Coordination of Care Discussed the treatment option of Broad Band Light (BBL) /Intense Pulsed Light (IPL)/ Laser for skin discoloration, including brown spots and redness.  Typically we recommend at least 1-3 treatment sessions about 5-8 weeks apart for best results.  Cannot have tanned skin when BBL performed, and regular use of sunscreen/photoprotection is advised after the procedure to help maintain results. The patient's condition may also require "maintenance treatments" in the future.  The fee for BBL / laser treatments is $350 per treatment session for the whole face.  A fee can be quoted for other parts of the body.  Insurance typically does not pay for BBL/laser treatments and therefore the fee is an out-of-pocket cost. Recommend prophylactic valtrex treatment. Once scheduled for procedure, will send Rx in prior to patient's appointment.   SCAR Secondary to fall R brow Exam: Dyspigmented  smooth macule or patch. Benign-appearing.  Observation.  Call clinic for new or changing lesions. Recommend daily broad spectrum sunscreen SPF 30+, reapply every 2 hours as needed. Treatment:  Recommend Serica moisturizing scar formula cream every night or [facility] brand or Mederma silicone scar sheet every night for the first year after a scar appears to help with scar remodeling if desired. Scars remodel on their own for a full year and will gradually improve in appearance over time.  Varicose Veins/Spider Veins Legs - Dilated blue, purple or red veins at the lower extremities - Reassured - Smaller vessels can be treated by sclerotherapy (a procedure to inject a medicine into the veins to make them disappear) if desired, but the treatment is not covered by insurance. Larger vessels may be covered if symptomatic and we would refer to vascular surgeon if treatment desired.   INFLAMED SEBORRHEIC KERATOSIS L cheek x 1 Symptomatic, irritating, patient would like treated. Destruction of lesion - L cheek x 1 Complexity: simple   Destruction method: cryotherapy   Informed consent: discussed and consent obtained   Timeout:  patient name, date of birth, surgical site, and procedure verified Lesion destroyed using liquid nitrogen: Yes   Region frozen until ice ball extended beyond lesion: Yes   Outcome: patient tolerated procedure well with no complications   Post-procedure details: wound care instructions given    Return in about 1 year (around 05/30/2024) for TBSE.  I, Rollie Clipper, RMA, am acting as scribe for Celine Collard, MD .   Documentation: I have reviewed the above documentation for accuracy and completeness, and I agree with the above.  Celine Collard, MD

## 2023-06-01 NOTE — Progress Notes (Signed)
 Remote pacemaker transmission.

## 2023-06-01 NOTE — Addendum Note (Signed)
 Addended by: Lott Rouleau A on: 06/01/2023 10:59 AM   Modules accepted: Orders

## 2023-06-06 NOTE — Progress Notes (Signed)
 Electrophysiology Clinic Note    Date:  06/07/2023  Patient ID:  Alejandra Wiley, Alejandra Wiley Dec 27, 1932, MRN [id number] PCP:  Jimmy Moulding, MD  Cardiologist:  Richardo Chandler, MD Electrophysiologist: Richardo Chandler, MD   Discussed the use of AI scribe software for clinical note transcription with the patient, who gave verbal consent to proceed.   Patient Profile    Chief Complaint: AFib, amiodarone  follow-up  History of Present Illness: Alejandra Wiley is a 89 y.o. female with PMH notable for SND s/p PPM, parox AFib, HFimpEF, HTN, orthostatic hypotension, CVA (associated with sub-therapeutic INR) ; seen today for Richardo Chandler, MD for routine electrophysiology followup.   Her orthostatic hypotension had previously been managed with proamatine . She last saw Dr. Rodolfo Clan 01/2023 with elevated BP after stopping proamatine . He started 2.5mg  amlodipine  that has subsequently been titrated to 5mg  daily. I last saw her 02/2023 for elevated BP. She had only recently started 5mg  amlodipine , and PT was not letting her complete sessions d/t elevated BP.  On follow-up today, she continues to experience intermittent dizziness and LH. She is not checking her BP at all at home. She completed PT several months ago and continues to have balance problems. She is not aware of any AF episodes, denies palpitations, chest pain, chest pressure, SOB.  She continues to take amiodarone  5 days a week. Continues to take eliquis  BID, no bleeding issues.      Device Information: MDT dual chamber PPM, imp 11/2003; dx SND St. Jude RA lead Gen change 09/2012   AAD History: Flecainide  - prolonged AV conduction > inc Vpacing > dec LV function.  - flec d/c with normalization of LV function  Amiodarone  - current    ROS:  Please see the history of present illness. All other systems are reviewed and otherwise negative.    Physical Exam    VS:  BP 110/66 (BP Location: Left Arm, Patient Position: Sitting, Cuff Size:  Normal)   Pulse 81   Ht 5' 6.5" (1.689 m)   Wt 112 lb (50.8 kg)   SpO2 96%   BMI 17.81 kg/m  BMI: Body mass index is 17.81 kg/m.  No data found.     Wt Readings from Last 3 Encounters:  06/07/23 112 lb (50.8 kg)  03/17/23 113 lb 8 oz (51.5 kg)  02/17/23 114 lb 12.8 oz (52.1 kg)     GEN- frail, thin appearing, alert and oriented x 3 today.   Lungs- Clear to ausculation bilaterally, normal work of breathing.  Heart- Regular rate and rhythm, no murmurs, rubs or gallops Extremities- No peripheral edema, warm, dry Skin-   device pocket well-healed, no tethering   Device interrogation done today and reviewed by myself:  Battery 18 months Lead thresholds, impedence, sensing stable  Patient presents in AP with trigeminal blocked PAC with AS falling into refractory Ventricular histograms appropriate 13% AF, limited EGMs to review - several marked "AF" episodes appear to be sinus w frequent PACs - several marked "AF" episodes are in fact Afib/aflutter No changes made today   Studies Reviewed   Previous EP, cardiology notes.    EKG is ordered. Personal review of EKG from today shows:   EKG Interpretation Date/Time:  Tuesday Jun 07 2023 10:40:49 EDT Ventricular Rate:  81 PR Interval:  198 QRS Duration:  142 QT Interval:  434 QTC Calculation: 504 R Axis:   -22  Text Interpretation: intermittent atrial paced rhythm with intermittent ventricular-paced Left bundle branch block Confirmed by  Tacarra Justo [phone]) on 06/07/2023 12:48:55 PM     TTE, 10/15/2021 1. Left ventricular ejection fraction, by estimation, is 55 to 60%. The left ventricle has normal function. The left ventricle has no regional wall motion abnormalities. Left ventricular diastolic parameters are consistent with Grade II diastolic  dysfunction (pseudonormalization).   2. Right ventricular systolic function is normal. The right ventricular size is normal. There is mildly elevated pulmonary artery systolic  pressure. The estimated right ventricular systolic pressure is 41.5 mmHg.   3. Left atrial size was mild to moderately dilated.   4. Right atrial size was mildly dilated.   5. The mitral valve is normal in structure. Mild to moderate mitral valve regurgitation. No evidence of mitral stenosis.   6. Tricuspid valve regurgitation is moderate.   7. The aortic valve is tricuspid. Aortic valve regurgitation is not visualized. No aortic stenosis is present.   8. The inferior vena cava is normal in size with greater than 50% respiratory variability, suggesting right atrial pressure of 3 mmHg.   Comparison(s): 12/31/20-EF45% MVR TVR.       Assessment and Plan     #) HTN #) orthostatic hypotension Normotensive today in clinic with significant history of orthostatic hypotension Encouraged patient to check BP more regularly, especially when not feeling well Will reduce amlodipine  to 2.5mg  daily  #) parox afib #) high risk medication use - amiodarone  #) PAC Unclear AF burden via device given some "AF" episodes are frequent PACs. EGMs not available for every episode Unable to program device around frequent PACs while also limiting ventricular pacing Will continue 200mg  amiodarone  daily Unable to add BB with patient's borderline BP Update CMP, thyroid  labs today  #) Hypercoag d/t parox afib CHA2DS2-VASc Score = at least 7 [CHF History: 1, HTN History: 1, Diabetes History: 0, Stroke History: 2, Vascular Disease History: 0, Age Score: 2, Gender Score: 1].  Therefore, the patient's annual risk of stroke is 11.2 %.    Stroke ppx - 2.5mg  eliquis  BID, dose appropriate reduced for age, weight No bleeding concerns Stable H/H (via care everywhere)  #) SND s/p PPM Discussed device interrogation with MDT rep, Abundio Hoit to program device around [facility] while also limiting VP VP ~22%     Current medicines are reviewed at length with the patient today.   The patient has concerns regarding her  medicines.  The following changes were made today:  none  Labs/ tests ordered today include:  Orders Placed This Encounter  Procedures   T4, free   TSH   Comprehensive metabolic panel with GFR   EKG 96-EAVW     Disposition: Follow up with Dr. Rodolfo Clan, Dr. Daneil Dunker, or EP APP in 3 months   I spent 50 minutes taking care of the patient today including reviewing previous OV notes, discussing device interrogation and EGMs with [employer] rep, and in face-to-face care with the patient.     Signed, Adaline Holly, NP  06/07/23  12:59 PM  Electrophysiology [facility]

## 2023-06-07 ENCOUNTER — Ambulatory Visit: Payer: Medicare Other | Attending: Cardiology | Admitting: Cardiology

## 2023-06-07 VITALS — BP 110/66 | HR 81 | Ht 66.5 in | Wt 112.0 lb

## 2023-06-07 DIAGNOSIS — Z950 Presence of cardiac pacemaker: Secondary | ICD-10-CM

## 2023-06-07 DIAGNOSIS — I495 Sick sinus syndrome: Secondary | ICD-10-CM | POA: Diagnosis present

## 2023-06-07 DIAGNOSIS — I480 Paroxysmal atrial fibrillation: Secondary | ICD-10-CM

## 2023-06-07 DIAGNOSIS — D6869 Other thrombophilia: Secondary | ICD-10-CM

## 2023-06-07 LAB — CUP PACEART INCLINIC DEVICE CHECK
Battery Impedance: 3433 Ohm
Battery Remaining Longevity: 17 mo
Battery Voltage: 2.72 V
Brady Statistic AP VP Percent: 17 %
Brady Statistic AP VS Percent: 58 %
Brady Statistic AS VP Percent: 7 %
Brady Statistic AS VS Percent: 18 %
Date Time Interrogation Session: 20250520141959
Implantable Lead Connection Status: 753985
Implantable Lead Connection Status: 753985
Implantable Lead Implant Date: 20051130
Implantable Lead Implant Date: 20051130
Implantable Lead Location: 753859
Implantable Lead Location: 753860
Implantable Lead Model: 5076
Implantable Pulse Generator Implant Date: 20140901
Lead Channel Impedance Value: 501 Ohm
Lead Channel Impedance Value: 586 Ohm
Lead Channel Pacing Threshold Amplitude: 0.375 V
Lead Channel Pacing Threshold Amplitude: 0.875 V
Lead Channel Pacing Threshold Pulse Width: 0.4 ms
Lead Channel Pacing Threshold Pulse Width: 0.4 ms
Lead Channel Sensing Intrinsic Amplitude: 11.2 mV
Lead Channel Setting Pacing Amplitude: 2 V
Lead Channel Setting Pacing Amplitude: 2.5 V
Lead Channel Setting Pacing Pulse Width: 0.4 ms
Lead Channel Setting Sensing Sensitivity: 4 mV
Zone Setting Status: 755011
Zone Setting Status: 755011

## 2023-06-07 MED ORDER — AMLODIPINE BESYLATE 5 MG PO TABS: 2.5000 mg | ORAL_TABLET | Freq: Every day | ORAL | 3 refills | Status: AC

## 2023-06-07 NOTE — Patient Instructions (Signed)
 Medication Instructions:  Your physician has recommended you make the following change in your medication:  1) CHANGE amlodipine  to 2.5 mg once daily  *If you need a refill on your cardiac medications before your next appointment, please call your pharmacy*  Lab Work: TODAY: CMET, TSH, T4  Follow-Up: At [facility], you and your health needs are our priority.  As part of our continuing mission to provide you with exceptional heart care, our providers are all part of one team.  This team includes your primary Cardiologist (physician) and Advanced Practice Providers or APPs (Physician Assistants and Nurse Practitioners) who all work together to provide you with the care you need, when you need it.  Your next appointment:   3-4 months  Provider:   You may see Richardo Chandler, MD or one of the following Advanced Practice Providers on your designated Care Team:   Mertha Abrahams, Kennard Pea "Jonelle Neri" [place], PA-C Suzann Riddle, NP Creighton Doffing, NP   Other Instructions Check your blood pressures daily for the next week

## 2023-06-08 ENCOUNTER — Ambulatory Visit: Payer: Self-pay | Admitting: Cardiology

## 2023-06-08 LAB — TSH: TSH: 1.59 u[IU]/mL (ref 0.450–4.500)

## 2023-06-08 LAB — COMPREHENSIVE METABOLIC PANEL WITH GFR
ALT: 19 IU/L (ref 0–32)
AST: 26 IU/L (ref 0–40)
Albumin: 4.1 g/dL (ref 3.7–4.7)
Alkaline Phosphatase: 83 IU/L (ref 44–121)
BUN/Creatinine Ratio: 21 (ref 12–28)
BUN: 19 mg/dL (ref 8–27)
Bilirubin Total: 0.4 mg/dL (ref 0.0–1.2)
CO2: 23 mmol/L (ref 20–29)
Calcium: 9.3 mg/dL (ref 8.7–10.3)
Chloride: 97 mmol/L (ref 96–106)
Creatinine, Ser: 0.89 mg/dL (ref 0.57–1.00)
Globulin, Total: 2.9 g/dL (ref 1.5–4.5)
Glucose: 84 mg/dL (ref 70–99)
Potassium: 4.6 mmol/L (ref 3.5–5.2)
Sodium: 136 mmol/L (ref 134–144)
Total Protein: 7 g/dL (ref 6.0–8.5)
eGFR: 62 mL/min/{1.73_m2} (ref >59)

## 2023-06-08 LAB — T4, FREE: Free T4: 1.41 ng/dL (ref 0.82–1.77)

## 2023-06-30 ENCOUNTER — Encounter: Payer: Self-pay | Admitting: Ophthalmology

## 2023-06-30 NOTE — Anesthesia Preprocedure Evaluation (Addendum)
 Anesthesia Evaluation  Patient identified by MRN, date of birth, ID band Patient awake    Reviewed: Allergy & Precautions, H&P , NPO status , Patient's Chart, lab work & pertinent test results  History of Anesthesia Complications (+) history of anesthetic complications (heart stopped at the end of surgery with Dr. Kathlynn during a back surgery in approx 2019.)  Airway Mallampati: II  TM Distance: >3 FB Neck ROM: full    Dental no notable dental hx.    Pulmonary neg pulmonary ROS   Pulmonary exam normal        Cardiovascular hypertension, +CHF (HFimpEF)  + dysrhythmias (SND) Atrial Fibrillation + pacemaker (MDT dual chamber PPM, imp 11/2003; dx SND St. Jude RA lead Gen change 09/2012)   ECHO 9/23:  1. Left ventricular ejection fraction, by estimation, is 55 to 60%. The  left ventricle has normal function. The left ventricle has no regional  wall motion abnormalities. Left ventricular diastolic parameters are  consistent with Grade II diastolic  dysfunction (pseudonormalization).   2. Right ventricular systolic function is normal. The right ventricular  size is normal. There is mildly elevated pulmonary artery systolic  pressure. The estimated right ventricular systolic pressure is 41.5 mmHg.   3. Left atrial size was mild to moderately dilated.   4. Right atrial size was mildly dilated.   5. The mitral valve is normal in structure. Mild to moderate mitral valve  regurgitation. No evidence of mitral stenosis.   6. Tricuspid valve regurgitation is moderate.   7. The aortic valve is tricuspid. Aortic valve regurgitation is not  visualized. No aortic stenosis is present.   8. The inferior vena cava is normal in size with greater than 50%  respiratory variability, suggesting right atrial pressure of 3 mmHg.     Neuro/Psych  PSYCHIATRIC DISORDERS Anxiety Depression     Neuromuscular disease CVA (associated with sub-therapeutic INR)     GI/Hepatic negative GI ROS, Neg liver ROS,,,  Endo/Other  Hypothyroidism    Renal/GU      Musculoskeletal  (+) Arthritis ,    Abdominal   Peds  Hematology negative hematology ROS (+)   Anesthesia Other Findings Past Medical History: No date: Anxiety No date: Aortic atherosclerosis (HCC) No date: Arthritis No date: Bradycardia 05/12/2001: Breast fibroadenoma, left     Comment:  a.) CNB (+) moderate epithelial hyperplasia without               atypia and fibrocystic changes No date: CHF (congestive heart failure) (HCC) No date: Complication of anesthesia     Comment:  heart stopped at the end of surgery with Dr. Kathlynn during              a back surgery in approx 2019. No date: Hypertension No date: Long term current use of anticoagulant     Comment:  a.) dose reduced apixaban  09/17/2016: MRSA nasal colonization 12/06/2003: Pacemaker     Comment:  Medtronic Impulse [id number] 2015: Pacemaker No date: PAF (paroxysmal atrial fibrillation) (HCC)     Comment:  a.) CHA2DS2-VASc = 8 (age x 2, sex, CHF, HTN, CVA x 2,               aortic plaque). 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)  Past Surgical History: No date: APPENDECTOMY 04/09/2021: BICEPT TENODESIS; Right     Comment:  Procedure: BICEPS TENODESIS;  Surgeon: Tobie Priest, MD;              Location: [facility] ORS;  Service: Orthopedics;  Laterality:               Right; 05/12/2001: BREAST BIOPSY; Left     Comment:  moderate epithelial hyperplasia without atypia and               fibrocystic changes No date: COLONOSCOPY No date: HERNIA REPAIR     Comment:  lower abd 2015: IMAGE GUIDED SINUS SURGERY 12/06/2003: INSERT /  REPLACE / REMOVE PACEMAKER     Comment:  Medtronic impulse [id number] No date: JOINT REPLACEMENT; Right     Comment:  knee 06/23/2017: KYPHOPLASTY; N/A     Comment:  Procedure: KYPHOPLASTY-T9;  Surgeon: Kathlynn Sharper, MD;               Location: [facility] ORS;  Service: Orthopedics;  Laterality:               N/A; 1999: LEEP 09/15/2022: OPEN REDUCTION INTERNAL FIXATION (ORIF) DISTAL RADIAL  FRACTURE; Right     Comment:  Procedure: OPEN REDUCTION INTERNAL FIXATION (ORIF)               DISTAL RADIUS FRACTURE;  Surgeon: Edie Norleen PARAS, MD;                Location: [facility] ORS;  Service: Orthopedics;  Laterality:               Right; 02/2016: ORIF HIP FRACTURE; Right 09/18/2012: PACEMAKER GENERATOR CHANGE; N/A     Comment:  Procedure: PACEMAKER GENERATOR CHANGE;  Surgeon: Elspeth JAYSON Sage, MD;  Location: [facility] CATH LAB;  Service:               Cardiovascular;  Laterality: N/A; 04/09/2021: REVERSE SHOULDER ARTHROPLASTY; Right     Comment:  Procedure: Right reverse shoulder arthroplasty for               fracture;  Surgeon: Tobie Priest, MD;  Location: [facility]               ORS;  Service: Orthopedics;  Laterality: Right; No date: TONSILLECTOMY No date: TOTAL KNEE ARTHROPLASTY 09/23/2016: TOTAL KNEE REVISION; Right     Comment:  Procedure: TOTAL KNEE REVISION-POLYETHYLENE EXCHANGE;                Surgeon: Kathlynn Sharper, MD;  Location: [facility] ORS;                Service: Orthopedics;  Laterality: Right; No date: VAGINAL HYSTERECTOMY  BMI    Body Mass Index: 18.28 kg/m      Reproductive/Obstetrics negative OB ROS                             Anesthesia Physical Anesthesia Plan  ASA: 3  Anesthesia Plan: MAC   Post-op Pain Management:    Induction: Intravenous  PONV Risk Score and Plan:   Airway Management Planned:   Additional Equipment:   Intra-op Plan:   Post-operative Plan:   Informed Consent:   Plan Discussed with: Anesthesiologist,  CRNA and  Surgeon  Anesthesia Plan Comments:         Anesthesia Quick Evaluation

## 2023-07-04 NOTE — Discharge Instructions (Signed)

## 2023-07-13 ENCOUNTER — Other Ambulatory Visit: Payer: Self-pay

## 2023-07-13 ENCOUNTER — Ambulatory Visit: Payer: Self-pay | Admitting: Anesthesiology

## 2023-07-13 ENCOUNTER — Encounter
Admission: RE | Disposition: A | Discharge status: Home / Self Care | Payer: Self-pay | Source: Home / Self Care | Attending: Ophthalmology

## 2023-07-13 ENCOUNTER — Encounter: Payer: Self-pay | Admitting: Ophthalmology

## 2023-07-13 ENCOUNTER — Ambulatory Visit
Admission: RE | Admit: 2023-07-13 | Discharge: 2023-07-13 | Disposition: A | Discharge status: Home / Self Care | Attending: Ophthalmology | Admitting: Ophthalmology

## 2023-07-13 DIAGNOSIS — H2512 Age-related nuclear cataract, left eye: Secondary | ICD-10-CM | POA: Diagnosis present

## 2023-07-13 DIAGNOSIS — I110 Hypertensive heart disease with heart failure: Secondary | ICD-10-CM | POA: Diagnosis not present

## 2023-07-13 DIAGNOSIS — Z8673 Personal history of transient ischemic attack (TIA), and cerebral infarction without residual deficits: Secondary | ICD-10-CM | POA: Diagnosis not present

## 2023-07-13 DIAGNOSIS — I480 Paroxysmal atrial fibrillation: Secondary | ICD-10-CM | POA: Diagnosis not present

## 2023-07-13 DIAGNOSIS — I5032 Chronic diastolic (congestive) heart failure: Secondary | ICD-10-CM | POA: Diagnosis not present

## 2023-07-13 DIAGNOSIS — Z950 Presence of cardiac pacemaker: Secondary | ICD-10-CM | POA: Diagnosis not present

## 2023-07-13 HISTORY — PX: CATARACT EXTRACTION W/PHACO: SHX586

## 2023-07-13 SURGERY — PHACOEMULSIFICATION, CATARACT, WITH IOL INSERTION
Anesthesia: Monitor Anesthesia Care | Laterality: Left

## 2023-07-13 MED ORDER — TETRACAINE HCL 0.5 % OP SOLN
OPHTHALMIC | Status: AC
Start: 2023-07-13 — End: 2023-07-13
  Filled 2023-07-13: qty 4

## 2023-07-13 MED ORDER — MIDAZOLAM HCL 2 MG/2ML IJ SOLN
INTRAMUSCULAR | Status: DC | PRN
  Administered 2023-07-13: 1 mg via INTRAVENOUS

## 2023-07-13 MED ORDER — SIGHTPATH DOSE#1 BSS IO SOLN
INTRAOCULAR | Status: DC | PRN
  Administered 2023-07-13: 58 mL via OPHTHALMIC

## 2023-07-13 MED ORDER — SIGHTPATH DOSE#1 BSS IO SOLN
INTRAOCULAR | Status: DC | PRN
  Administered 2023-07-13: 15 mL via INTRAOCULAR

## 2023-07-13 MED ORDER — BRIMONIDINE TARTRATE-TIMOLOL 0.2-0.5 % OP SOLN
OPHTHALMIC | Status: DC | PRN
  Administered 2023-07-13: 1 [drp] via OPHTHALMIC

## 2023-07-13 MED ORDER — LIDOCAINE HCL (PF) 2 % IJ SOLN
INTRAOCULAR | Status: DC | PRN
  Administered 2023-07-13: 2 mL

## 2023-07-13 MED ORDER — ARMC OPHTHALMIC DILATING DROPS
1.0000 | OPHTHALMIC | Status: DC | PRN
  Administered 2023-07-13 (×3): 1 via OPHTHALMIC

## 2023-07-13 MED ORDER — CEFUROXIME OPHTHALMIC INJECTION 1 MG/0.1 ML
INJECTION | OPHTHALMIC | Status: DC | PRN
  Administered 2023-07-13: 1 mg via INTRACAMERAL

## 2023-07-13 MED ORDER — SIGHTPATH DOSE#1 NA HYALUR & NA CHOND-NA HYALUR IO KIT
PACK | INTRAOCULAR | Status: DC | PRN
  Administered 2023-07-13: 1 via OPHTHALMIC

## 2023-07-13 MED ORDER — MIDAZOLAM HCL 2 MG/2ML IJ SOLN
INTRAMUSCULAR | Status: AC
  Filled 2023-07-13: qty 2

## 2023-07-13 MED ORDER — TETRACAINE HCL 0.5 % OP SOLN
1.0000 [drp] | OPHTHALMIC | Status: DC | PRN
  Administered 2023-07-13 (×3): 1 [drp] via OPHTHALMIC

## 2023-07-13 MED ORDER — ARMC OPHTHALMIC DILATING DROPS
OPHTHALMIC | Status: AC
  Filled 2023-07-13: qty 0.5

## 2023-07-13 SURGICAL SUPPLY — 10 items
CATARACT SUITE SIGHTPATH (MISCELLANEOUS) ×1 IMPLANT
FEE CATARACT SUITE SIGHTPATH (MISCELLANEOUS) ×1 IMPLANT
GLOVE BIOGEL PI IND STRL 8 (GLOVE) ×1 IMPLANT
GLOVE SURG LX STRL 7.5 STRW (GLOVE) ×1 IMPLANT
GLOVE SURG PROTEXIS BL SZ6.5 (GLOVE) ×1 IMPLANT
GLOVE SURG SYN 6.5 PF PI BL (GLOVE) ×1 IMPLANT
LENS IOL CLRN VT TRC 4 22.0 IMPLANT
NDL FILTER BLUNT 18X1 1/2 (NEEDLE) ×1 IMPLANT
NEEDLE FILTER BLUNT 18X1 1/2 (NEEDLE) ×1 IMPLANT
SYR 3ML LL SCALE MARK (SYRINGE) ×1 IMPLANT

## 2023-07-13 NOTE — H&P (Signed)
 [facility]   Primary Care Physician:  Lenon Layman ORN, MD Ophthalmologist: Dr. Dene Etienne  Pre-Procedure History & Physical: HPI:  VELITA QUIRK is a 89 y.o. female here for ophthalmic surgery.   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 Kathlynn during a back surgery in approx 2019.   Hypertension    Long term current use of anticoagulant    a.) dose reduced apixaban    MRSA nasal colonization 09/17/2016   Pacemaker 12/06/2003   Medtronic Impulse [id number]   Pacemaker 2015   PAF (paroxysmal atrial fibrillation) (HCC)    a.) CHA2DS2-VASc = 8 (age x 2, sex, CHF, HTN, CVA x 2, aortic plaque). b.) rate/rhythm maintained on oral amiodarone  + metoprolol  succinate; chronically anticoagulated with dose reduced apixaban    Sick sinus syndrome (HCC)    Sinoatrial node dysfunction (HCC)    Stroke (HCC)    Vaginal intraepithelial neoplasia    VAIN (vaginal intraepithelial neoplasia)     Past Surgical History:  Procedure Laterality Date   APPENDECTOMY     BICEPT TENODESIS Right 04/09/2021   Procedure: BICEPS TENODESIS;  Surgeon: Tobie Priest, MD;  Location: [facility] ORS;  Service: Orthopedics;  Laterality: Right;   BREAST BIOPSY Left 05/12/2001   moderate epithelial hyperplasia without atypia and fibrocystic changes   COLONOSCOPY     HERNIA REPAIR     lower abd   IMAGE GUIDED SINUS SURGERY  2015   INSERT / REPLACE / REMOVE PACEMAKER  12/06/2003   Medtronic impulse [id number]   JOINT REPLACEMENT Right    knee   KYPHOPLASTY N/A 06/23/2017   Procedure: [id number]-U0;  Surgeon: Kathlynn Sharper, MD;  Location: [facility] ORS;  Service: Orthopedics;  Laterality: N/A;   LEEP   1999   OPEN REDUCTION INTERNAL FIXATION (ORIF) DISTAL RADIAL FRACTURE Right 09/15/2022   Procedure: OPEN REDUCTION INTERNAL FIXATION (ORIF) DISTAL RADIUS FRACTURE;  Surgeon: Edie Norleen PARAS, MD;  Location: [facility] ORS;  Service: Orthopedics;  Laterality: Right;   ORIF HIP FRACTURE Right 02/2016   PACEMAKER GENERATOR CHANGE N/A 09/18/2012   Procedure: PACEMAKER GENERATOR CHANGE;  Surgeon: Elspeth JAYSON Sage, MD;  Location: [facility] CATH LAB;  Service: Cardiovascular;  Laterality: N/A;   REVERSE SHOULDER ARTHROPLASTY Right 04/09/2021   Procedure: Right reverse shoulder arthroplasty for fracture;  Surgeon: Tobie Priest, MD;  Location: [facility] ORS;  Service: Orthopedics;  Laterality: Right;   TONSILLECTOMY     TOTAL KNEE ARTHROPLASTY     TOTAL KNEE REVISION Right 09/23/2016   Procedure: TOTAL KNEE REVISION-POLYETHYLENE EXCHANGE;  Surgeon: Kathlynn Sharper, MD;  Location: [facility] ORS;  Service: Orthopedics;  Laterality: Right;   VAGINAL HYSTERECTOMY      Prior to Admission medications   Medication Sig Start Date End Date Taking? Authorizing Provider  acetaminophen  (TYLENOL ) 500 MG tablet Take 500 mg by mouth every 8 (eight) hours as needed for mild pain (pain score 1-3) or headache. Give 2 tablet by mouth twice daily   Yes [provider]  amiodarone  (PACERONE ) 200 MG tablet TAKE 1 TABLET 5 DAYS A WEEK 05/31/23  Yes Sage Elspeth JAYSON, MD  amLODipine  (NORVASC ) 5 MG tablet Take 0.5 tablets (2.5 mg total)  by mouth daily. 06/07/23  Yes Riddle, Suzann, NP  Calcium -Vitamin D (CALTRATE 600 PLUS-VIT D PO) Take 1 tablet by mouth daily.   Yes [provider]  ELIQUIS  2.5 MG TABS tablet TAKE 1 TABLET TWICE A DAY 05/30/23  Yes Fernande Elspeth BROCKS, MD  Multiple Vitamins-Minerals (SENIOR MULTIVITAMIN PLUS PO) Take 1 tablet by mouth daily.   Yes [provider]  polyethylene glycol (MIRALAX  / GLYCOLAX ) 17 g packet Take 17 g by mouth daily as needed.   Yes [provider]  propranolol  (INDERAL ) 20 MG tablet Take  20 mg by mouth 2 (two) times daily.   Yes [provider]  risedronate (ACTONEL) 150 MG tablet Take 150 mg by mouth every 30 (thirty) days. with water  on empty stomach, nothing by mouth or lie down for next 30 minutes.   Yes [provider]  buPROPion (WELLBUTRIN XL) 150 MG 24 hr tablet Take 150 mg by mouth daily. Patient not taking: Reported on 06/30/2023    [provider]    Allergies as of 05/05/2023 - Review Complete 03/17/2023  Allergen Reaction Noted   Latex Other (See Comments) 11/06/2013   Other Other (See Comments) 10/13/2012   Tape Other (See Comments) 10/13/2012   Xarelto  [rivaroxaban ] Other (See Comments) 03/19/2016   Nickel Rash and Other (See Comments) 11/06/2013    Family History  Problem Relation Age of Onset   Stroke Mother    Heart disease Father    Diabetes Daughter    Diabetes Son     Social History   Socioeconomic History   Marital status: Married    Spouse name: Arley   Number of children: Not on file   Years of education: Not on file   Highest education level: Not on file  Occupational History   Occupation: Part time  Tobacco Use   Smoking status: Never   Smokeless tobacco: Never  Vaping Use   Vaping status: Never Used  Substance and Sexual Activity   Alcohol use: Yes    Comment: 3 oz wine daily   Drug use: No   Sexual activity: Not Currently  Other Topics Concern   Not on file  Social History Narrative   Pt gets regular exercise.   Social Drivers of Corporate investment banker Strain: Low Risk  (05/18/2022)   Received from [facility]   Overall Financial Resource Strain (CARDIA)    Difficulty of Paying Living Expenses: Not hard at all  Food Insecurity: No Food Insecurity (09/14/2022)   Hunger Vital Sign    Worried About Running Out of Food in the Last Year: Never true    Ran Out of Food in the Last Year: Never true  Transportation Needs: No Transportation Needs (09/14/2022)   PRAPARE -  Administrator, Civil Service (Medical): No    Lack of Transportation (Non-Medical): No  Physical Activity: Not on file  Stress: Not on file  Social Connections: Not on file  Intimate Partner Violence: Not At Risk (09/14/2022)   Humiliation, Afraid, Rape, and Kick questionnaire    Fear of Current or Ex-Partner: No    Emotionally Abused: No    Physically Abused: No    Sexually Abused: No    Review of Systems: See HPI, otherwise negative ROS  Physical Exam: BP (!) 155/73   Pulse 75   Temp (!) 97.5 F (36.4 C) (Temporal)   Resp 12   Ht 5' 6.5 (1.689 m)   Wt 52.2 kg  SpO2 99%   BMI 18.28 kg/m  General:   Alert,  pleasant and cooperative in NAD Head:  Normocephalic and atraumatic. Lungs:  Clear to auscultation.    Heart:  Regular rate and rhythm.   Impression/Plan: Naomie FORBES Becker is here for ophthalmic surgery.  Risks, benefits, limitations, and alternatives regarding ophthalmic surgery have been reviewed with the patient.  Questions have been answered.  All parties agreeable.   MITTIE GASKIN, MD  07/13/2023, 9:32 AM

## 2023-07-13 NOTE — Transfer of Care (Signed)
 Immediate Anesthesia Transfer of Care Note  Patient: Alejandra Wiley  Procedure(s) Performed: PHACOEMULSIFICATION, CATARACT, WITH IOL INSERTION (Left)  Patient Location: PACU  Anesthesia Type: MAC  Level of Consciousness: awake, alert  and patient cooperative  Airway and Oxygen Therapy: Patient Spontanous Breathing and Patient connected to supplemental oxygen  Post-op Assessment: Post-op Vital signs reviewed, Patient's Cardiovascular Status Stable, Respiratory Function Stable, Patent Airway and No signs of Nausea or vomiting  Post-op Vital Signs: Reviewed and stable  Complications: No notable events documented.

## 2023-07-13 NOTE — Op Note (Signed)
 LOCATION:  [facility]   PREOPERATIVE DIAGNOSIS:  Nuclear sclerotic cataract of the left eye.  H25.12  POSTOPERATIVE DIAGNOSIS:  Nuclear sclerotic cataract of the left eye.   PROCEDURE:  Phacoemulsification with Toric posterior chamber intraocular lens placement of the left eye.  Ultrasound time: Procedure(s): PHACOEMULSIFICATION, CATARACT, WITH IOL INSERTION (Left)  LENS:   Implant Name Type Inv. Item Serial No. Manufacturer Lot No. LRB No. Used Action  LENS IOL CLRN VT TRC 4 22.0 - [id number]  LENS IOL CLRN VT TRC 4 22.0 [id number] [employer]  Left 1 Implanted     CNWET4 VivityToric intraocular lens with 2.25 diopters of cylindrical power with axis orientation at 8 degrees.     SURGEON:  Dene FABIENE Etienne, MD   ANESTHESIA:  Topical with tetracaine  drops and 2% Xylocaine  jelly, augmented with 1% preservative-free intracameral lidocaine .  COMPLICATIONS:  None.   DESCRIPTION OF PROCEDURE:  The patient was identified in the holding room and transported to the operating suite and placed in the supine position under the operating microscope.  The left eye was identified as the operative eye, and it was prepped and draped in the usual sterile ophthalmic fashion.    A clear-corneal paracentesis incision was made at the 1:30 position.  0.5 ml of preservative-free 1% lidocaine  was injected into the anterior chamber. The anterior chamber was filled with Viscoat.  A 2.4 millimeter near clear corneal incision was then made at the 10:30 position.  A cystotome and capsulorrhexis forceps were then used to make a curvilinear capsulorrhexis.  Hydrodissection and hydrodelineation were then performed using balanced salt solution.   Phacoemulsification was then used in stop and chop fashion to remove the lens, nucleus and epinucleus.  The remaining cortex was aspirated using the irrigation and aspiration handpiece.  Provisc viscoelastic was then placed into the capsular bag to distend it for  lens placement.  The Verion digital marker was used to align the implant at the intended axis.   A Toric lens was then injected into the capsular bag.  It was rotated clockwise until the axis marks on the lens were approximately 15 degrees in the counterclockwise direction to the intended alignment.  The viscoelastic was aspirated from the eye using the irrigation aspiration handpiece.  Then, a Koch spatula through the sideport incision was used to rotate the lens in a clockwise direction until the axis markings of the intraocular lens were lined up with the Verion alignment.  Balanced salt solution was then used to hydrate the wounds. Cefuroxime 0.1 ml of a 10mg /ml solution was injected into the anterior chamber for a dose of 1 mg of intracameral antibiotic at the completion of the case.    The eye was noted to have a physiologic pressure and there was no wound leak noted.   Timolol and Brimonidine drops were applied to the eye.  The patient was taken to the recovery room in stable condition having had no complications of anesthesia or surgery.  Roniyah Llorens 07/13/2023, 10:16 AM

## 2023-07-13 NOTE — Anesthesia Postprocedure Evaluation (Signed)
 Anesthesia Post Note  Patient: Alejandra Wiley  Procedure(s) Performed: PHACOEMULSIFICATION, CATARACT, WITH IOL INSERTION (Left)  Patient location during evaluation: PACU Anesthesia Type: MAC Level of consciousness: awake and alert Pain management: pain level controlled Vital Signs Assessment: post-procedure vital signs reviewed and stable Respiratory status: spontaneous breathing, nonlabored ventilation, respiratory function stable and patient connected to nasal cannula oxygen Cardiovascular status: stable and blood pressure returned to baseline Postop Assessment: no apparent nausea or vomiting Anesthetic complications: no   No notable events documented.   Last Vitals:  Vitals:   07/13/23 1019 07/13/23 1023  BP: 127/71 118/75  Pulse:  67  Resp: 11 (!) 23  Temp: 36.6 C 36.6 C  SpO2:  98%    Last Pain:  Vitals:   07/13/23 1019  TempSrc:   PainSc: 0-No pain                 Lendia LITTIE Mae

## 2023-07-14 ENCOUNTER — Encounter: Payer: Self-pay | Admitting: Ophthalmology

## 2023-07-18 NOTE — Discharge Instructions (Signed)

## 2023-07-19 ENCOUNTER — Encounter: Payer: Self-pay | Admitting: Ophthalmology

## 2023-07-19 NOTE — Anesthesia Preprocedure Evaluation (Addendum)
 Anesthesia Evaluation  Patient identified by MRN, date of birth, ID band Patient awake    Reviewed: Allergy & Precautions, H&P , NPO status , Patient's Chart, lab work & pertinent test results, reviewed documented beta blocker date and time   History of Anesthesia Complications (+) history of anesthetic complications  Airway Mallampati: II  TM Distance: >3 FB Neck ROM: full    Dental no notable dental hx. (+) Teeth Intact   Pulmonary neg pulmonary ROS   Pulmonary exam normal breath sounds clear to auscultation       Cardiovascular Exercise Tolerance: Poor hypertension, On Medications +CHF  + pacemaker  Rhythm:regular Rate:Normal  Cardiovascular hypertension, +CHF (HFimpEF)  + dysrhythmias (SND) Atrial Fibrillation + pacemaker (MDT dual chamber PPM, imp 11/2003; dx SND St. Jude RA lead Gen change 09/2012)    ECHO 9/23:  1. Left ventricular ejection fraction, by estimation, is 55 to 60%. The  left ventricle has normal function. The left ventricle has no regional  wall motion abnormalities. Left ventricular diastolic parameters are  consistent with Grade II diastolic  dysfunction (pseudonormalization).   2. Right ventricular systolic function is normal. The right ventricular  size is normal. There is mildly elevated pulmonary artery systolic  pressure. The estimated right ventricular systolic pressure is 41.5 mmHg.   3. Left atrial size was mild to moderately dilated.   4. Right atrial size was mildly dilated.   5. The mitral valve is normal in structure. Mild to moderate mitral valve  regurgitation. No evidence of mitral stenosis.   6. Tricuspid valve regurgitation is moderate.   7. The aortic valve is tricuspid. Aortic valve regurgitation is not  visualized. No aortic stenosis is present.   8. The inferior vena cava is normal in size with greater than 50%  respiratory variability, suggesting right atrial pressure of 3 mmHg.          Neuro/Psych   Anxiety Depression     Neuromuscular disease CVA  negative psych ROS   GI/Hepatic negative GI ROS, Neg liver ROS,,,  Endo/Other  diabetesHypothyroidism    Renal/GU Renal disease     Musculoskeletal   Abdominal   Peds  Hematology negative hematology ROS (+)   Anesthesia Other Findings 07-13-23 Dr. Chesley  Bradycardia  Sinoatrial node dysfunction [facility]) Sick sinus syndrome (HCC)  Vaginal intraepithelial neoplasia Pacemaker  VAIN (vaginal intraepithelial neoplasia) PAF (paroxysmal atrial fibrillation) (HCC) CHF (congestive heart failure) (HCC) Hypertension Stroke (HCC) Complication of anesthesia--heart stopped at the end of surgery with Dr. Kathlynn during a back surgery in approx 2019. Arthritis  Anxiety MRSA nasal colonization  Aortic atherosclerosis (HCC) Long term current use of anticoagulant Breast fibroadenoma, left Grade II diastolic dysfunction     Reproductive/Obstetrics negative OB ROS                              Anesthesia Physical Anesthesia Plan  ASA: 3  Anesthesia Plan: MAC   Post-op Pain Management:    Induction: Intravenous  PONV Risk Score and Plan:   Airway Management Planned: Natural Airway and Nasal Cannula  Additional Equipment:   Intra-op Plan:   Post-operative Plan:   Informed Consent: I have reviewed the patients History and Physical, chart, labs and discussed the procedure including the risks, benefits and alternatives for the proposed anesthesia with the patient or authorized representative who has indicated his/her understanding and acceptance.     Dental Advisory Given  Plan Discussed with: Anesthesiologist, CRNA  and Surgeon  Anesthesia Plan Comments: (Patient consented for risks of anesthesia including but not limited to:  - adverse reactions to medications - damage to eyes, teeth, lips or other oral mucosa - nerve damage due to positioning  - sore throat or  hoarseness - Damage to heart, brain, nerves, lungs, other parts of body or loss of life  Patient voiced understanding and assent.)         Anesthesia Quick Evaluation

## 2023-07-20 ENCOUNTER — Encounter
Admission: RE | Disposition: A | Discharge status: Home / Self Care | Payer: Self-pay | Source: Home / Self Care | Attending: Ophthalmology

## 2023-07-20 ENCOUNTER — Ambulatory Visit
Admission: RE | Admit: 2023-07-20 | Discharge: 2023-07-20 | Disposition: A | Discharge status: Home / Self Care | Attending: Ophthalmology | Admitting: Ophthalmology

## 2023-07-20 ENCOUNTER — Ambulatory Visit: Payer: Self-pay | Admitting: Anesthesiology

## 2023-07-20 ENCOUNTER — Other Ambulatory Visit: Payer: Self-pay

## 2023-07-20 ENCOUNTER — Encounter: Payer: Self-pay | Admitting: Ophthalmology

## 2023-07-20 DIAGNOSIS — I700 Atherosclerosis of aorta: Secondary | ICD-10-CM | POA: Diagnosis not present

## 2023-07-20 DIAGNOSIS — I110 Hypertensive heart disease with heart failure: Secondary | ICD-10-CM | POA: Insufficient documentation

## 2023-07-20 DIAGNOSIS — Z950 Presence of cardiac pacemaker: Secondary | ICD-10-CM | POA: Insufficient documentation

## 2023-07-20 DIAGNOSIS — I509 Heart failure, unspecified: Secondary | ICD-10-CM | POA: Diagnosis not present

## 2023-07-20 DIAGNOSIS — I480 Paroxysmal atrial fibrillation: Secondary | ICD-10-CM | POA: Insufficient documentation

## 2023-07-20 DIAGNOSIS — H2511 Age-related nuclear cataract, right eye: Secondary | ICD-10-CM | POA: Insufficient documentation

## 2023-07-20 DIAGNOSIS — Z7901 Long term (current) use of anticoagulants: Secondary | ICD-10-CM | POA: Diagnosis not present

## 2023-07-20 HISTORY — DX: Other ill-defined heart diseases: I51.89

## 2023-07-20 HISTORY — PX: CATARACT EXTRACTION W/PHACO: SHX586

## 2023-07-20 SURGERY — PHACOEMULSIFICATION, CATARACT, WITH IOL INSERTION
Anesthesia: Monitor Anesthesia Care | Site: Eye | Laterality: Right

## 2023-07-20 MED ORDER — ARMC OPHTHALMIC DILATING DROPS
OPHTHALMIC | Status: AC
  Filled 2023-07-20: qty 0.5

## 2023-07-20 MED ORDER — TETRACAINE HCL 0.5 % OP SOLN
OPHTHALMIC | Status: AC
  Filled 2023-07-20: qty 4

## 2023-07-20 MED ORDER — SIGHTPATH DOSE#1 BSS IO SOLN
INTRAOCULAR | Status: DC | PRN
Start: 2023-07-20 — End: 2023-07-20
  Administered 2023-07-20: 15 mL via INTRAOCULAR

## 2023-07-20 MED ORDER — LACTATED RINGERS IV SOLN: INTRAVENOUS | Status: DC

## 2023-07-20 MED ORDER — ARMC OPHTHALMIC DILATING DROPS
1.0000 | OPHTHALMIC | Status: DC | PRN
  Administered 2023-07-20 (×3): 1 via OPHTHALMIC

## 2023-07-20 MED ORDER — SIGHTPATH DOSE#1 NA HYALUR & NA CHOND-NA HYALUR IO KIT
PACK | INTRAOCULAR | Status: DC | PRN
  Administered 2023-07-20: 1 via OPHTHALMIC

## 2023-07-20 MED ORDER — ACETAMINOPHEN 10 MG/ML IV SOLN: 1000.0000 mg | Freq: Once | INTRAVENOUS | Status: DC | PRN

## 2023-07-20 MED ORDER — CEFUROXIME OPHTHALMIC INJECTION 1 MG/0.1 ML
INJECTION | OPHTHALMIC | Status: DC | PRN
  Administered 2023-07-20: 1 mg via INTRACAMERAL

## 2023-07-20 MED ORDER — TETRACAINE HCL 0.5 % OP SOLN
1.0000 [drp] | OPHTHALMIC | Status: DC | PRN
  Administered 2023-07-20 (×3): 1 [drp] via OPHTHALMIC

## 2023-07-20 MED ORDER — OXYCODONE HCL 5 MG/5ML PO SOLN: 5.0000 mg | Freq: Once | ORAL | Status: DC | PRN

## 2023-07-20 MED ORDER — SIGHTPATH DOSE#1 BSS IO SOLN
INTRAOCULAR | Status: DC | PRN
  Administered 2023-07-20: 65 mL via OPHTHALMIC

## 2023-07-20 MED ORDER — OXYCODONE HCL 5 MG PO TABS: 5.0000 mg | ORAL_TABLET | Freq: Once | ORAL | Status: DC | PRN

## 2023-07-20 MED ORDER — DROPERIDOL 2.5 MG/ML IJ SOLN: 0.6250 mg | Freq: Once | INTRAMUSCULAR | Status: DC | PRN

## 2023-07-20 MED ORDER — FENTANYL CITRATE (PF) 100 MCG/2ML IJ SOLN
INTRAMUSCULAR | Status: AC
  Filled 2023-07-20: qty 2

## 2023-07-20 MED ORDER — FENTANYL CITRATE (PF) 100 MCG/2ML IJ SOLN
INTRAMUSCULAR | Status: DC | PRN
  Administered 2023-07-20: 25 ug via INTRAVENOUS

## 2023-07-20 MED ORDER — LIDOCAINE HCL (PF) 2 % IJ SOLN
INTRAOCULAR | Status: DC | PRN
  Administered 2023-07-20: 2 mL

## 2023-07-20 MED ORDER — BRIMONIDINE TARTRATE-TIMOLOL 0.2-0.5 % OP SOLN
OPHTHALMIC | Status: DC | PRN
  Administered 2023-07-20: 1 [drp] via OPHTHALMIC

## 2023-07-20 MED ORDER — MIDAZOLAM HCL 2 MG/2ML IJ SOLN
INTRAMUSCULAR | Status: AC
  Filled 2023-07-20: qty 2

## 2023-07-20 MED ORDER — FENTANYL CITRATE PF 50 MCG/ML IJ SOSY: 25.0000 ug | PREFILLED_SYRINGE | INTRAMUSCULAR | Status: DC | PRN

## 2023-07-20 MED ORDER — MIDAZOLAM HCL 2 MG/2ML IJ SOLN
INTRAMUSCULAR | Status: DC | PRN
  Administered 2023-07-20: 1 mg via INTRAVENOUS

## 2023-07-20 SURGICAL SUPPLY — 10 items
CATARACT SUITE SIGHTPATH (MISCELLANEOUS) ×1 IMPLANT
FEE CATARACT SUITE SIGHTPATH (MISCELLANEOUS) ×1 IMPLANT
GLOVE BIOGEL PI IND STRL 8 (GLOVE) ×1 IMPLANT
GLOVE SURG LX STRL 7.5 STRW (GLOVE) ×1 IMPLANT
GLOVE SURG PROTEXIS BL SZ6.5 (GLOVE) ×1 IMPLANT
GLOVE SURG SYN 6.5 PF PI BL (GLOVE) ×1 IMPLANT
LENS IOL CLRN 22.0 (Intraocular Lens) IMPLANT
NDL FILTER BLUNT 18X1 1/2 (NEEDLE) ×1 IMPLANT
NEEDLE FILTER BLUNT 18X1 1/2 (NEEDLE) ×1 IMPLANT
SYR 3ML LL SCALE MARK (SYRINGE) ×1 IMPLANT

## 2023-07-20 NOTE — Transfer of Care (Signed)
 Immediate Anesthesia Transfer of Care Note  Patient: Alejandra Wiley  Procedure(s) Performed: PHACOEMULSIFICATION, CATARACT, WITH IOL INSERTION 6.39 00:38.2 (Right: Eye)  Patient Location: PACU  Anesthesia Type: MAC  Level of Consciousness: awake, alert  and patient cooperative  Airway and Oxygen Therapy: Patient Spontanous Breathing and Patient connected to supplemental oxygen  Post-op Assessment: Post-op Vital signs reviewed, Patient's Cardiovascular Status Stable, Respiratory Function Stable, Patent Airway and No signs of Nausea or vomiting  Post-op Vital Signs: Reviewed and stable  Complications: No notable events documented.

## 2023-07-20 NOTE — H&P (Signed)
 [facility]   Primary Care Physician:  Lenon Layman ORN, MD Ophthalmologist: Dr. Dene Etienne  Pre-Procedure History & Physical: HPI:  Alejandra Wiley is a 89 y.o. female here for ophthalmic surgery.   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 Kathlynn during a back surgery in approx 2019.   Grade II diastolic dysfunction    Hypertension    Long term current use of anticoagulant    a.) dose reduced apixaban    MRSA nasal colonization 09/17/2016   Pacemaker 12/06/2003   Medtronic Impulse [id number]   Pacemaker 2015   PAF (paroxysmal atrial fibrillation) (HCC)    a.) CHA2DS2-VASc = 8 (age x 2, sex, CHF, HTN, CVA x 2, aortic plaque). b.) rate/rhythm maintained on oral amiodarone  + metoprolol  succinate; chronically anticoagulated with dose reduced apixaban    Sick sinus syndrome (HCC)    Sinoatrial node dysfunction (HCC)    Stroke (HCC)    Vaginal intraepithelial neoplasia    VAIN (vaginal intraepithelial neoplasia)     Past Surgical History:  Procedure Laterality Date   APPENDECTOMY     BICEPT TENODESIS Right 04/09/2021   Procedure: BICEPS TENODESIS;  Surgeon: Tobie Priest, MD;  Location: [facility];  Service: Orthopedics;  Laterality: Right;   BREAST BIOPSY Left 05/12/2001   moderate epithelial hyperplasia without atypia and fibrocystic changes   CATARACT EXTRACTION W/PHACO Left 07/13/2023   Procedure: PHACOEMULSIFICATION, CATARACT, WITH IOL INSERTION;  Surgeon: Etienne Dene, MD;  Location: [facility];  Service: Ophthalmology;  Laterality: Left;   COLONOSCOPY     HERNIA REPAIR     lower abd   IMAGE GUIDED SINUS SURGERY  2015   INSERT / REPLACE  / REMOVE PACEMAKER  12/06/2003   Medtronic impulse [id number]   JOINT REPLACEMENT Right    knee   KYPHOPLASTY N/A 06/23/2017   Procedure: [id number];  Surgeon: Kathlynn Sharper, MD;  Location: [facility];  Service: Orthopedics;  Laterality: N/A;   LEEP  1999   OPEN REDUCTION INTERNAL FIXATION (ORIF) DISTAL RADIAL FRACTURE Right 09/15/2022   Procedure: OPEN REDUCTION INTERNAL FIXATION (ORIF) DISTAL RADIUS FRACTURE;  Surgeon: Edie Norleen PARAS, MD;  Location: [facility];  Service: Orthopedics;  Laterality: Right;   ORIF HIP FRACTURE Right 02/2016   PACEMAKER GENERATOR CHANGE N/A 09/18/2012   Procedure: PACEMAKER GENERATOR CHANGE;  Surgeon: Elspeth JAYSON Sage, MD;  Location: [facility];  Service: Cardiovascular;  Laterality: N/A;   REVERSE SHOULDER ARTHROPLASTY Right 04/09/2021   Procedure: Right reverse shoulder arthroplasty for fracture;  Surgeon: Tobie Priest, MD;  Location: [facility];  Service: Orthopedics;  Laterality: Right;   TONSILLECTOMY     TOTAL KNEE ARTHROPLASTY     TOTAL KNEE REVISION Right 09/23/2016   Procedure: TOTAL KNEE REVISION-POLYETHYLENE EXCHANGE;  Surgeon: Kathlynn Sharper, MD;  Location: [facility];  Service: Orthopedics;  Laterality: Right;   VAGINAL HYSTERECTOMY      Prior to Admission medications   Medication Sig Start Date End Date Taking? Authorizing Provider  acetaminophen  (TYLENOL ) 500 MG tablet Take 500 mg by mouth every 8 (eight) hours as needed for mild pain (pain score 1-3) or headache. Give 2 tablet by mouth twice daily  Yes [provider]  amiodarone  (PACERONE ) 200 MG tablet TAKE 1 TABLET 5 DAYS A WEEK 05/31/23  Yes Fernande Elspeth BROCKS, MD  amLODipine  (NORVASC ) 5 MG tablet Take 0.5 tablets (2.5 mg total) by mouth daily. 06/07/23  Yes Riddle, Suzann, NP  buPROPion (WELLBUTRIN XL) 150 MG 24 hr tablet Take 150 mg by mouth daily.   Yes [provider]  Calcium -Vitamin D (CALTRATE 600 PLUS-VIT D PO) Take 1 tablet by mouth daily.   Yes [provider]  ELIQUIS   2.5 MG TABS tablet TAKE 1 TABLET TWICE A DAY 05/30/23  Yes Fernande Elspeth BROCKS, MD  Multiple Vitamins-Minerals (SENIOR MULTIVITAMIN PLUS PO) Take 1 tablet by mouth daily.   Yes [provider]  polyethylene glycol (MIRALAX  / GLYCOLAX ) 17 g packet Take 17 g by mouth daily as needed.   Yes [provider]  propranolol  (INDERAL ) 20 MG tablet Take 20 mg by mouth 2 (two) times daily.   Yes [provider]  risedronate (ACTONEL) 150 MG tablet Take 150 mg by mouth every 30 (thirty) days. with water  on empty stomach, nothing by mouth or lie down for next 30 minutes.   Yes [provider]    Allergies as of 05/05/2023 - Review Complete 03/17/2023  Allergen Reaction Noted   Latex Other (See Comments) 11/06/2013   Other Other (See Comments) 10/13/2012   Tape Other (See Comments) 10/13/2012   Xarelto  [rivaroxaban ] Other (See Comments) 03/19/2016   Nickel Rash and Other (See Comments) 11/06/2013    Family History  Problem Relation Age of Onset   Stroke Mother    Heart disease Father    Diabetes Daughter    Diabetes Son     Social History   Socioeconomic History   Marital status: Married    Spouse name: Arley   Number of children: Not on file   Years of education: Not on file   Highest education level: Not on file  Occupational History   Occupation: Part time  Tobacco Use   Smoking status: Never   Smokeless tobacco: Never  Vaping Use   Vaping status: Never Used  Substance and Sexual Activity   Alcohol use: Yes    Comment: 3 oz wine daily   Drug use: No   Sexual activity: Not Currently  Other Topics Concern   Not on file  Social History Narrative   Pt gets regular exercise.   Social Drivers of Corporate investment banker Strain: Low Risk  (05/18/2022)   Received from [facility]   Overall Financial Resource Strain (CARDIA)    Difficulty of Paying Living Expenses: Not hard at all  Food Insecurity: No Food Insecurity (09/14/2022)    Hunger Vital Sign    Worried About Running Out of Food in the Last Year: Never true    Ran Out of Food in the Last Year: Never true  Transportation Needs: No Transportation Needs (09/14/2022)   PRAPARE - Administrator, Civil Service (Medical): No    Lack of Transportation (Non-Medical): No  Physical Activity: Not on file  Stress: Not on file  Social Connections: Not on file  Intimate Partner Violence: Not At Risk (09/14/2022)   Humiliation, Afraid, Rape, and Kick questionnaire    Fear of Current or Ex-Partner: No    Emotionally Abused: No    Physically Abused: No    Sexually Abused: No    Review of Systems: See HPI, otherwise negative ROS  Physical Exam: There were no  vitals taken for this visit. General:   Alert,  pleasant and cooperative in NAD Head:  Normocephalic and atraumatic. Lungs:  Clear to auscultation.    Heart:  Regular rate and rhythm.   Impression/Plan: Alejandra Wiley is here for ophthalmic surgery.  Risks, benefits, limitations, and alternatives regarding ophthalmic surgery have been reviewed with the patient.  Questions have been answered.  All parties agreeable.   MITTIE GASKIN, MD  07/20/2023, 9:45 AM

## 2023-07-20 NOTE — Op Note (Signed)
 LOCATION:  [facility]   PREOPERATIVE DIAGNOSIS:    Nuclear sclerotic cataract right eye. H25.11   POSTOPERATIVE DIAGNOSIS:  Nuclear sclerotic cataract right eye.     PROCEDURE:  Phacoemusification with posterior chamber intraocular lens placement of the right eye   ULTRASOUND TIME: Procedure(s): PHACOEMULSIFICATION, CATARACT, WITH IOL INSERTION 6.39 00:38.2 (Right)  LENS:   Implant Name Type Inv. Item Serial No. Manufacturer Lot No. LRB No. Used Action  LENS IOL CLRN 22.0 - [id number] Intraocular Lens LENS IOL CLRN 22.0 [id number] [employer]  Right 1 Implanted         SURGEON:  Dene FABIENE Etienne, MD   ANESTHESIA:  Topical with tetracaine  drops and 2% Xylocaine  jelly, augmented with 1% preservative-free intracameral lidocaine .    COMPLICATIONS:  None.   DESCRIPTION OF PROCEDURE:  The patient was identified in the holding room and transported to the operating room and placed in the supine position under the operating microscope.  The right eye was identified as the operative eye and it was prepped and draped in the usual sterile ophthalmic fashion.   A 1 millimeter clear-corneal paracentesis was made at the 12:00 position.  0.5 ml of preservative-free 1% lidocaine  was injected into the anterior chamber. The anterior chamber was filled with Viscoat viscoelastic.  A 2.4 millimeter keratome was used to make a near-clear corneal incision at the 9:00 position.  A curvilinear capsulorrhexis was made with a cystotome and capsulorrhexis forceps.  Balanced salt solution was used to hydrodissect and hydrodelineate the nucleus.   Phacoemulsification was then used in stop and chop fashion to remove the lens nucleus and epinucleus.  The remaining cortex was then removed using the irrigation and aspiration handpiece. Provisc was then placed into the capsular bag to distend it for lens placement.  A lens was then injected into the capsular bag.  The remaining viscoelastic was  aspirated.   Wounds were hydrated with balanced salt solution.  The anterior chamber was inflated to a physiologic pressure with balanced salt solution.  No wound leaks were noted. Cefuroxime  0.1 ml of a 10mg /ml solution was injected into the anterior chamber for a dose of 1 mg of intracameral antibiotic at the completion of the case.   Timolol  and Brimonidine  drops were applied to the eye.  The patient was taken to the recovery room in stable condition without complications of anesthesia or surgery.   Makinze Jani 07/20/2023, 10:58 AM

## 2023-07-26 NOTE — Anesthesia Postprocedure Evaluation (Signed)
 Anesthesia Post Note  Patient: NAELA NODAL  Procedure(s) Performed: PHACOEMULSIFICATION, CATARACT, WITH IOL INSERTION 6.39 00:38.2 (Right: Eye)  Patient location during evaluation: PACU Anesthesia Type: MAC Level of consciousness: awake and alert Pain management: pain level controlled Vital Signs Assessment: post-procedure vital signs reviewed and stable Respiratory status: spontaneous breathing, nonlabored ventilation, respiratory function stable and patient connected to nasal cannula oxygen Cardiovascular status: stable and blood pressure returned to baseline Postop Assessment: no apparent nausea or vomiting Anesthetic complications: no   No notable events documented.   Last Vitals:  Vitals:   07/20/23 1059 07/20/23 1105  BP: (!) 151/76 (!) 149/78  Pulse: 71 70  Resp: 14 15  Temp: (!) 36.4 C (!) 36.4 C  SpO2: 100% 100%    Last Pain:  Vitals:   07/21/23 1117  PainSc: 0-No pain                 Lynwood KANDICE Clause

## 2023-07-27 ENCOUNTER — Ambulatory Visit (INDEPENDENT_AMBULATORY_CARE_PROVIDER_SITE_OTHER): Payer: Self-pay

## 2023-07-27 DIAGNOSIS — I495 Sick sinus syndrome: Secondary | ICD-10-CM

## 2023-07-28 LAB — CUP PACEART REMOTE DEVICE CHECK
Battery Impedance: 3542 Ohm
Battery Remaining Longevity: 15 mo
Battery Voltage: 2.71 V
Brady Statistic AP VP Percent: 19 %
Brady Statistic AP VS Percent: 65 %
Brady Statistic AS VP Percent: 5 %
Brady Statistic AS VS Percent: 11 %
Date Time Interrogation Session: 20250709194120
Implantable Lead Connection Status: 753985
Implantable Lead Connection Status: 753985
Implantable Lead Implant Date: 20051130
Implantable Lead Implant Date: 20051130
Implantable Lead Location: 753859
Implantable Lead Location: 753860
Implantable Lead Model: 5076
Implantable Pulse Generator Implant Date: 20140901
Lead Channel Impedance Value: 468 Ohm
Lead Channel Impedance Value: 537 Ohm
Lead Channel Pacing Threshold Amplitude: 0.375 V
Lead Channel Pacing Threshold Amplitude: 0.875 V
Lead Channel Pacing Threshold Pulse Width: 0.4 ms
Lead Channel Pacing Threshold Pulse Width: 0.4 ms
Lead Channel Setting Pacing Amplitude: 2 V
Lead Channel Setting Pacing Amplitude: 2.5 V
Lead Channel Setting Pacing Pulse Width: 0.4 ms
Lead Channel Setting Sensing Sensitivity: 4 mV
Zone Setting Status: 755011
Zone Setting Status: 755011

## 2023-09-08 ENCOUNTER — Encounter: Admitting: Internal Medicine

## 2023-10-11 ENCOUNTER — Encounter: Admitting: Cardiology

## 2023-10-24 NOTE — Progress Notes (Unsigned)
 Electrophysiology Clinic Note    Date:  10/25/2023  Patient ID:  [place], Bejar Aug 04, 1932, MRN [id number] PCP:  Lenon Layman ORN, MD  Cardiologist:  Elspeth Sage, MD  Electrophysiologist:  Elspeth Sage, MD  Electrophysiology APP:  Nonna Renninger, NP Amaurie Wandel, NP     Discussed the use of AI scribe software for clinical note transcription with the patient, who gave verbal consent to proceed.   Patient Profile    Chief Complaint: AFib follow-up  History of Present Illness: ASHELYNN MARKS is a 89 y.o. female with PMH notable for SND s/p PPM, parox AFib, HFimpEF, HTN, orthostatic hypotension, CVA (associated with sub-therapeutic INR) ; seen today for Elspeth Sage, MD for routine electrophysiology followup.   Her orthostatic hypotension was previously managed with proamatine , this was stopped d/t HTN and low-dose amlodipine  was started.   I last saw her 05/2023 where she was not aware of AFib episodes. Device interrogation with AP with trigeminal PAC, unable to program around while limiting VP.   On follow-up today, she had a particularly bad day this past weekend, felt significantly more fatigued than usual and pretty much rested for the entire day. She wears apple watch during the day and her watch usually reads her AFib burden at 2-3%, but this particular day it read 30%.  She continues to take eliquis  BID, no bleeding concerns.  Continues to take 200mg  amiodarone  5 days a week.   She denies chest pain, chest pressure, palpitations, presyncope or SOB.    Her husband joins for visit whom I have met before.     Arrhythmia/Device History MDT dual chamber PPM, imp 11/2003, dx SND St. Jude RA lead  - gen change 2014  AAD - Flecainide  - prolonged AV conduction > inc Vpacing > dec LV function.  - flec d/c with normalization of LV function  Amiodarone  - current    ROS:  Please see the history of present illness. All other systems are reviewed and  otherwise negative.    Physical Exam    VS:  BP 118/63 (BP Location: Left Arm, Patient Position: Sitting, Cuff Size: Normal)   Pulse (!) 53   Ht 6' (1.829 m)   Wt 118 lb 6.4 oz (53.7 kg)   SpO2 96%   BMI 16.06 kg/m  BMI: Body mass index is 16.06 kg/m.           Wt Readings from Last 3 Encounters:  10/25/23 118 lb 6.4 oz (53.7 kg)  07/20/23 116 lb (52.6 kg)  07/13/23 115 lb (52.2 kg)     GEN- The patient is well appearing, alert and oriented x 3 today.   Lungs- Clear to ausculation bilaterally, normal work of breathing.  Heart- Regular rate and rhythm, no murmurs, rubs or gallops Extremities- No peripheral edema, warm, dry Skin-  device pocket well-healed, no tethering    Brief check performed without iterative lead testing/measurements Battery 14months Increased AF burden as of late VP 10%   Studies Reviewed   Previous EP, cardiology notes.    EKG is ordered. Personal review of EKG from today shows:    EKG Interpretation Date/Time:  Tuesday October 25 2023 11:13:12 EDT Ventricular Rate:  53 PR Interval:  88 QRS Duration:  142 QT Interval:  460 QTC Calculation: 431 R Axis:   -36  Text Interpretation: Sinus bradycardia with short PR with frequent ventricular-paced complexes and Premature supraventricular complexes Left axis deviation Left bundle branch block Confirmed by Roselin Wiemann [phone])  on 10/25/2023 11:17:58 AM    TTE, 10/15/2021 1. Left ventricular ejection fraction, by estimation, is 55 to 60%. The left ventricle has normal function. The left ventricle has no regional wall motion abnormalities. Left ventricular diastolic parameters are consistent with Grade II diastolic dysfunction (pseudonormalization).   2. Right ventricular systolic function is normal. The right ventricular size is normal. There is mildly elevated pulmonary artery systolic pressure. The estimated right ventricular systolic pressure is 41.5 mmHg.   3. Left atrial size was mild to  moderately dilated.   4. Right atrial size was mildly dilated.   5. The mitral valve is normal in structure. Mild to moderate mitral valve regurgitation. No evidence of mitral stenosis.   6. Tricuspid valve regurgitation is moderate.   7. The aortic valve is tricuspid. Aortic valve regurgitation is not visualized. No aortic stenosis is present.   8. The inferior vena cava is normal in size with greater than 50% respiratory variability, suggesting right atrial pressure of 3 mmHg.   Comparison(s): 12/31/20-EF45% MVR TVR.   TTE, 12/31/2020  1. Left ventricular ejection fraction, by estimation, is 45%. The left ventricle has mildly decreased function. The left ventricle demonstrates global hypokinesis. Left ventricular diastolic parameters are consistent with Grade II diastolic dysfunction (pseudonormalization).   2. Right ventricular systolic function is normal. The right ventricular size is normal.   3. The mitral valve is normal in structure. Mild mitral valve regurgitation.   4. The aortic valve is tricuspid. Aortic valve regurgitation is not visualized.   5. The inferior vena cava is normal in size with greater than 50% respiratory variability, suggesting right atrial pressure of 3 mmHg.    Assessment and Plan     #) parox AFib #) amiodarone  monitoring Elevated AF burden recently after going many months with low burden She has been on 200mg  amiodarone  x 5 days a week for years Discussed increasing dose to 200mg  daily At this time, patient prefers to continue current amiodarone  dose and continue to monitor AF burden which I think is reasonable Update LFT, thyroid  labs today   #) Hypercoag d/t parox afib CHA2DS2-VASc Score = at least 7 [CHF History: 1, HTN History: 1, Diabetes History: 0, Stroke History: 2, Vascular Disease History: 0, Age Score: 2, Gender Score: 1].  Therefore, the patient's annual risk of stroke is 11.2 %.    Stroke ppx - 2.5mg  eliquis  BID, appropriately dosed No  bleeding concerns   #) SND s/p PPM Most recent remote with stable lead parameters Battery 14 month VP 10%      Current medicines are reviewed at length with the patient today.   The patient does not have concerns regarding her medicines.  The following changes were made today:  none  Labs/ tests ordered today include:  Orders Placed This Encounter  Procedures   Hepatic function panel   TSH   T4, free   EKG 12-Lead     Disposition: Follow up with Dr. Kennyth or EP APP in 6 months, sooner if needed.  Prefer MD appt to establish care.     Signed, Chantal Needle, NP  10/25/23  12:08 PM  Electrophysiology [facility]

## 2023-10-25 ENCOUNTER — Encounter: Payer: Self-pay | Admitting: Cardiology

## 2023-10-25 ENCOUNTER — Ambulatory Visit: Attending: Cardiology | Admitting: Cardiology

## 2023-10-25 VITALS — BP 118/63 | HR 53 | Ht 72.0 in | Wt 118.4 lb

## 2023-10-25 DIAGNOSIS — I480 Paroxysmal atrial fibrillation: Secondary | ICD-10-CM | POA: Diagnosis present

## 2023-10-25 DIAGNOSIS — D6869 Other thrombophilia: Secondary | ICD-10-CM | POA: Insufficient documentation

## 2023-10-25 DIAGNOSIS — Z950 Presence of cardiac pacemaker: Secondary | ICD-10-CM | POA: Insufficient documentation

## 2023-10-25 DIAGNOSIS — Z79899 Other long term (current) drug therapy: Secondary | ICD-10-CM | POA: Diagnosis present

## 2023-10-25 DIAGNOSIS — I495 Sick sinus syndrome: Secondary | ICD-10-CM | POA: Insufficient documentation

## 2023-10-25 DIAGNOSIS — Z5181 Encounter for therapeutic drug level monitoring: Secondary | ICD-10-CM | POA: Diagnosis present

## 2023-10-25 NOTE — Patient Instructions (Signed)
 Medication Instructions:  Your physician recommends that you continue on your current medications as directed. Please refer to the Current Medication list given to you today.   *If you need a refill on your cardiac medications before your next appointment, please call your pharmacy*  Lab Work: Your provider would like for you to have following labs drawn today LFT, TSH and T4.   If you have labs (blood work) drawn today and your tests are completely normal, you will receive your results only by: MyChart Message (if you have MyChart) OR A paper copy in the mail If you have any lab test that is abnormal or we need to change your treatment, we will call you to review the results.  Testing/Procedures: No test ordered today   Follow-Up: At [facility], you and your health needs are our priority.  As part of our continuing mission to provide you with exceptional heart care, our providers are all part of one team.  This team includes your primary Cardiologist (physician) and Advanced Practice Providers or APPs (Physician Assistants and Nurse Practitioners) who all work together to provide you with the care you need, when you need it.  Your next appointment:   6 month(s)  Provider:   Suzann Riddle, NP or Dr. Kennyth    We recommend signing up for the patient portal called MyChart.  Sign up information is provided on this After Visit Summary.  MyChart is used to connect with patients for Virtual Visits (Telemedicine).  Patients are able to view lab/test results, encounter notes, upcoming appointments, etc.  Non-urgent messages can be sent to your provider as well.   To learn more about what you can do with MyChart, go to [email or web address].

## 2023-10-26 ENCOUNTER — Ambulatory Visit: Payer: Self-pay | Admitting: Cardiology

## 2023-10-26 ENCOUNTER — Ambulatory Visit: Payer: Self-pay

## 2023-10-26 DIAGNOSIS — I480 Paroxysmal atrial fibrillation: Secondary | ICD-10-CM | POA: Diagnosis not present

## 2023-10-26 LAB — HEPATIC FUNCTION PANEL
ALT: 15 IU/L (ref 0–32)
AST: 23 IU/L (ref 0–40)
Albumin: 4.1 g/dL (ref 3.7–4.7)
Alkaline Phosphatase: 77 IU/L (ref 48–129)
Bilirubin Total: 0.5 mg/dL (ref 0.0–1.2)
Bilirubin, Direct: 0.13 mg/dL (ref 0.00–0.40)
Total Protein: 6.9 g/dL (ref 6.0–8.5)

## 2023-10-26 LAB — TSH: TSH: 1.92 u[IU]/mL (ref 0.450–4.500)

## 2023-10-26 LAB — T4, FREE: Free T4: 1.39 ng/dL (ref 0.82–1.77)

## 2023-10-26 NOTE — Telephone Encounter (Signed)
 Letter mailed to patient with the following from Suzann Riddle, NP.  Labs are stable, no change to plan.

## 2023-10-31 LAB — CUP PACEART REMOTE DEVICE CHECK
Battery Impedance: 3717 Ohm
Battery Remaining Longevity: 14 mo
Battery Voltage: 2.7 V
Brady Statistic AP VP Percent: 32 %
Brady Statistic AP VS Percent: 50 %
Brady Statistic AS VP Percent: 10 %
Brady Statistic AS VS Percent: 8 %
Date Time Interrogation Session: 20251010143520
Implantable Lead Connection Status: 753985
Implantable Lead Connection Status: 753985
Implantable Lead Implant Date: 20051130
Implantable Lead Implant Date: 20051130
Implantable Lead Location: 753859
Implantable Lead Location: 753860
Implantable Lead Model: 5076
Implantable Pulse Generator Implant Date: 20140901
Lead Channel Impedance Value: 501 Ohm
Lead Channel Impedance Value: 557 Ohm
Lead Channel Pacing Threshold Amplitude: 0.375 V
Lead Channel Pacing Threshold Amplitude: 1 V
Lead Channel Pacing Threshold Pulse Width: 0.4 ms
Lead Channel Pacing Threshold Pulse Width: 0.4 ms
Lead Channel Setting Pacing Amplitude: 2 V
Lead Channel Setting Pacing Amplitude: 2.5 V
Lead Channel Setting Pacing Pulse Width: 0.4 ms
Lead Channel Setting Sensing Sensitivity: 4 mV
Zone Setting Status: 755011
Zone Setting Status: 755011

## 2023-11-01 NOTE — Progress Notes (Signed)
 Remote PPM Transmission

## 2023-11-25 NOTE — Addendum Note (Signed)
 Addended by: CLAUDENE NEST A on: 11/25/2023 04:15 PM   Modules accepted: Orders

## 2023-11-25 NOTE — Progress Notes (Signed)
 Remote pacemaker transmission.

## 2023-11-27 ENCOUNTER — Ambulatory Visit: Payer: Self-pay | Admitting: Cardiology

## 2024-01-25 ENCOUNTER — Ambulatory Visit: Payer: Self-pay

## 2024-01-25 DIAGNOSIS — I495 Sick sinus syndrome: Secondary | ICD-10-CM

## 2024-01-26 LAB — CUP PACEART REMOTE DEVICE CHECK
Battery Impedance: 3973 Ohm
Battery Remaining Longevity: 13 mo
Battery Voltage: 2.7 V
Brady Statistic AP VP Percent: 3 %
Brady Statistic AP VS Percent: 91 %
Brady Statistic AS VP Percent: 1 %
Brady Statistic AS VS Percent: 5 %
Date Time Interrogation Session: 20260102160300
Implantable Lead Connection Status: 753985
Implantable Lead Connection Status: 753985
Implantable Lead Implant Date: 20051130
Implantable Lead Implant Date: 20051130
Implantable Lead Location: 753859
Implantable Lead Location: 753860
Implantable Lead Model: 5076
Implantable Pulse Generator Implant Date: 20140901
Lead Channel Impedance Value: 498 Ohm
Lead Channel Impedance Value: 587 Ohm
Lead Channel Pacing Threshold Amplitude: 0.5 V
Lead Channel Pacing Threshold Amplitude: 1 V
Lead Channel Pacing Threshold Pulse Width: 0.4 ms
Lead Channel Pacing Threshold Pulse Width: 0.4 ms
Lead Channel Setting Pacing Amplitude: 2 V
Lead Channel Setting Pacing Amplitude: 2.5 V
Lead Channel Setting Pacing Pulse Width: 0.4 ms
Lead Channel Setting Sensing Sensitivity: 4 mV
Zone Setting Status: 755011
Zone Setting Status: 755011

## 2024-01-28 ENCOUNTER — Ambulatory Visit: Payer: Self-pay | Admitting: Cardiology

## 2024-01-30 NOTE — Progress Notes (Signed)
 Remote PPM Transmission

## 2024-02-23 ENCOUNTER — Ambulatory Visit: Admitting: Cardiology

## 2024-02-25 ENCOUNTER — Ambulatory Visit

## 2024-03-27 ENCOUNTER — Ambulatory Visit

## 2024-04-27 ENCOUNTER — Ambulatory Visit

## 2024-05-15 ENCOUNTER — Ambulatory Visit: Admitting: Cardiology

## 2024-05-28 ENCOUNTER — Ambulatory Visit

## 2024-05-30 ENCOUNTER — Ambulatory Visit: Admitting: Dermatology

## 2024-06-28 ENCOUNTER — Ambulatory Visit

## 2024-07-29 ENCOUNTER — Ambulatory Visit
